# Patient Record
Sex: Female | Born: 1960 | Race: White | Hispanic: No | Marital: Married | Smoking: Current every day smoker
Health system: Southern US, Community
[De-identification: ages and names within clinical notes are randomized; demographics above are authoritative.]

## PROBLEM LIST (undated history)

## (undated) DIAGNOSIS — Z923 Personal history of irradiation: Secondary | ICD-10-CM

## (undated) DIAGNOSIS — G43909 Migraine, unspecified, not intractable, without status migrainosus: Secondary | ICD-10-CM

## (undated) DIAGNOSIS — C801 Malignant (primary) neoplasm, unspecified: Secondary | ICD-10-CM

## (undated) DIAGNOSIS — E781 Pure hyperglyceridemia: Secondary | ICD-10-CM

## (undated) DIAGNOSIS — G4730 Sleep apnea, unspecified: Secondary | ICD-10-CM

## (undated) DIAGNOSIS — F329 Major depressive disorder, single episode, unspecified: Secondary | ICD-10-CM

## (undated) DIAGNOSIS — R002 Palpitations: Secondary | ICD-10-CM

## (undated) DIAGNOSIS — C50919 Malignant neoplasm of unspecified site of unspecified female breast: Secondary | ICD-10-CM

## (undated) DIAGNOSIS — F32A Depression, unspecified: Secondary | ICD-10-CM

## (undated) DIAGNOSIS — E669 Obesity, unspecified: Secondary | ICD-10-CM

## (undated) DIAGNOSIS — R233 Spontaneous ecchymoses: Secondary | ICD-10-CM

## (undated) DIAGNOSIS — K219 Gastro-esophageal reflux disease without esophagitis: Secondary | ICD-10-CM

## (undated) DIAGNOSIS — E039 Hypothyroidism, unspecified: Secondary | ICD-10-CM

## (undated) DIAGNOSIS — E230 Hypopituitarism: Secondary | ICD-10-CM

## (undated) DIAGNOSIS — M1990 Unspecified osteoarthritis, unspecified site: Secondary | ICD-10-CM

## (undated) DIAGNOSIS — H8109 Meniere's disease, unspecified ear: Secondary | ICD-10-CM

## (undated) DIAGNOSIS — R238 Other skin changes: Secondary | ICD-10-CM

## (undated) DIAGNOSIS — K589 Irritable bowel syndrome without diarrhea: Secondary | ICD-10-CM

## (undated) DIAGNOSIS — T7840XA Allergy, unspecified, initial encounter: Secondary | ICD-10-CM

## (undated) DIAGNOSIS — Z5189 Encounter for other specified aftercare: Secondary | ICD-10-CM

## (undated) DIAGNOSIS — E119 Type 2 diabetes mellitus without complications: Secondary | ICD-10-CM

## (undated) HISTORY — DX: Migraine, unspecified, not intractable, without status migrainosus: G43.909

## (undated) HISTORY — DX: Spontaneous ecchymoses: R23.3

## (undated) HISTORY — DX: Palpitations: R00.2

## (undated) HISTORY — DX: Major depressive disorder, single episode, unspecified: F32.9

## (undated) HISTORY — DX: Meniere's disease, unspecified ear: H81.09

## (undated) HISTORY — DX: Allergy, unspecified, initial encounter: T78.40XA

## (undated) HISTORY — DX: Hypothyroidism, unspecified: E03.9

## (undated) HISTORY — DX: Pure hyperglyceridemia: E78.1

## (undated) HISTORY — DX: Encounter for other specified aftercare: Z51.89

## (undated) HISTORY — DX: Gastro-esophageal reflux disease without esophagitis: K21.9

## (undated) HISTORY — DX: Type 2 diabetes mellitus without complications: E11.9

## (undated) HISTORY — PX: SPINE SURGERY: SHX786

## (undated) HISTORY — DX: Malignant (primary) neoplasm, unspecified: C80.1

## (undated) HISTORY — DX: Irritable bowel syndrome, unspecified: K58.9

## (undated) HISTORY — DX: Malignant neoplasm of unspecified site of unspecified female breast: C50.919

## (undated) HISTORY — PX: BREAST SURGERY: SHX581

## (undated) HISTORY — DX: Unspecified osteoarthritis, unspecified site: M19.90

## (undated) HISTORY — DX: Hypopituitarism: E23.0

## (undated) HISTORY — DX: Obesity, unspecified: E66.9

## (undated) HISTORY — DX: Other skin changes: R23.8

## (undated) HISTORY — DX: Depression, unspecified: F32.A

## (undated) HISTORY — DX: Sleep apnea, unspecified: G47.30

## (undated) HISTORY — PX: BREAST RECONSTRUCTION: SHX9

## (undated) HISTORY — PX: TUBAL LIGATION: SHX77

---

## 1983-10-05 HISTORY — PX: CHOLECYSTECTOMY: SHX55

## 1996-10-04 HISTORY — PX: ABDOMINAL HYSTERECTOMY: SHX81

## 2005-10-04 DIAGNOSIS — C50919 Malignant neoplasm of unspecified site of unspecified female breast: Secondary | ICD-10-CM

## 2005-10-04 HISTORY — DX: Malignant neoplasm of unspecified site of unspecified female breast: C50.919

## 2005-10-04 HISTORY — PX: BREAST SURGERY: SHX581

## 2005-12-01 ENCOUNTER — Encounter: Admission: RE | Admit: 2005-12-01 | Discharge: 2005-12-01 | Payer: Self-pay | Admitting: Oncology

## 2005-12-01 ENCOUNTER — Ambulatory Visit (HOSPITAL_COMMUNITY): Payer: Self-pay | Admitting: Oncology

## 2005-12-01 ENCOUNTER — Encounter (HOSPITAL_COMMUNITY): Admission: RE | Admit: 2005-12-01 | Discharge: 2005-12-31 | Payer: Self-pay | Admitting: Oncology

## 2005-12-10 ENCOUNTER — Encounter: Admission: RE | Admit: 2005-12-10 | Discharge: 2005-12-10 | Payer: Self-pay | Admitting: Oncology

## 2005-12-10 ENCOUNTER — Encounter (INDEPENDENT_AMBULATORY_CARE_PROVIDER_SITE_OTHER): Payer: Self-pay | Admitting: Diagnostic Radiology

## 2005-12-10 ENCOUNTER — Encounter (INDEPENDENT_AMBULATORY_CARE_PROVIDER_SITE_OTHER): Payer: Self-pay | Admitting: Specialist

## 2005-12-24 ENCOUNTER — Encounter: Admission: RE | Admit: 2005-12-24 | Discharge: 2005-12-24 | Payer: Self-pay | Admitting: General Surgery

## 2006-01-31 ENCOUNTER — Encounter: Admission: RE | Admit: 2006-01-31 | Discharge: 2006-01-31 | Payer: Self-pay | Admitting: Oncology

## 2006-01-31 ENCOUNTER — Encounter (HOSPITAL_COMMUNITY): Admission: RE | Admit: 2006-01-31 | Discharge: 2006-03-02 | Payer: Self-pay | Admitting: Oncology

## 2006-01-31 ENCOUNTER — Ambulatory Visit (HOSPITAL_COMMUNITY): Payer: Self-pay | Admitting: Oncology

## 2006-02-01 ENCOUNTER — Ambulatory Visit: Payer: Self-pay | Admitting: *Deleted

## 2006-03-04 ENCOUNTER — Encounter: Admission: RE | Admit: 2006-03-04 | Discharge: 2006-03-04 | Payer: Self-pay | Admitting: Oncology

## 2006-03-04 ENCOUNTER — Encounter (HOSPITAL_COMMUNITY): Admission: RE | Admit: 2006-03-04 | Discharge: 2006-04-03 | Payer: Self-pay | Admitting: Oncology

## 2006-03-18 ENCOUNTER — Ambulatory Visit (HOSPITAL_COMMUNITY): Payer: Self-pay | Admitting: Oncology

## 2006-04-02 ENCOUNTER — Encounter (HOSPITAL_COMMUNITY): Admission: RE | Admit: 2006-04-02 | Discharge: 2006-05-02 | Payer: Self-pay | Admitting: Oncology

## 2006-04-02 ENCOUNTER — Encounter: Admission: RE | Admit: 2006-04-02 | Discharge: 2006-04-02 | Payer: Self-pay | Admitting: Oncology

## 2006-05-09 ENCOUNTER — Encounter: Admission: RE | Admit: 2006-05-09 | Discharge: 2006-05-09 | Payer: Self-pay | Admitting: Oncology

## 2006-05-09 ENCOUNTER — Encounter (HOSPITAL_COMMUNITY): Admission: RE | Admit: 2006-05-09 | Discharge: 2006-06-08 | Payer: Self-pay | Admitting: Oncology

## 2006-05-13 ENCOUNTER — Ambulatory Visit: Payer: Self-pay | Admitting: Cardiology

## 2006-05-20 ENCOUNTER — Ambulatory Visit: Admission: RE | Admit: 2006-05-20 | Discharge: 2006-06-26 | Payer: Self-pay | Admitting: Radiation Oncology

## 2006-06-10 ENCOUNTER — Ambulatory Visit (HOSPITAL_COMMUNITY): Payer: Self-pay | Admitting: Oncology

## 2006-06-10 ENCOUNTER — Encounter (HOSPITAL_COMMUNITY): Admission: RE | Admit: 2006-06-10 | Discharge: 2006-07-01 | Payer: Self-pay | Admitting: Oncology

## 2006-06-10 ENCOUNTER — Encounter: Admission: RE | Admit: 2006-06-10 | Discharge: 2006-07-01 | Payer: Self-pay | Admitting: Oncology

## 2006-06-29 ENCOUNTER — Ambulatory Visit: Admission: RE | Admit: 2006-06-29 | Discharge: 2006-09-27 | Payer: Self-pay | Admitting: Radiation Oncology

## 2006-07-08 ENCOUNTER — Encounter: Admission: RE | Admit: 2006-07-08 | Discharge: 2006-07-08 | Payer: Self-pay | Admitting: Oncology

## 2006-07-08 ENCOUNTER — Encounter (HOSPITAL_COMMUNITY): Admission: RE | Admit: 2006-07-08 | Discharge: 2006-08-07 | Payer: Self-pay | Admitting: Oncology

## 2006-08-02 ENCOUNTER — Ambulatory Visit (HOSPITAL_COMMUNITY): Payer: Self-pay | Admitting: Oncology

## 2006-08-19 ENCOUNTER — Encounter: Admission: RE | Admit: 2006-08-19 | Discharge: 2006-08-19 | Payer: Self-pay | Admitting: Oncology

## 2006-08-19 ENCOUNTER — Encounter (HOSPITAL_COMMUNITY): Admission: RE | Admit: 2006-08-19 | Discharge: 2006-09-18 | Payer: Self-pay | Admitting: Oncology

## 2006-09-07 ENCOUNTER — Ambulatory Visit: Payer: Self-pay | Admitting: Cardiology

## 2006-09-30 ENCOUNTER — Ambulatory Visit (HOSPITAL_COMMUNITY): Payer: Self-pay | Admitting: Oncology

## 2006-10-04 HISTORY — PX: BREAST LUMPECTOMY: SHX2

## 2006-10-14 ENCOUNTER — Encounter (HOSPITAL_COMMUNITY): Admission: RE | Admit: 2006-10-14 | Discharge: 2006-11-13 | Payer: Self-pay | Admitting: Oncology

## 2006-11-25 ENCOUNTER — Encounter (HOSPITAL_COMMUNITY): Admission: RE | Admit: 2006-11-25 | Discharge: 2006-12-25 | Payer: Self-pay | Admitting: Oncology

## 2006-11-25 ENCOUNTER — Ambulatory Visit (HOSPITAL_COMMUNITY): Payer: Self-pay | Admitting: Oncology

## 2006-12-15 ENCOUNTER — Ambulatory Visit: Payer: Self-pay | Admitting: Cardiology

## 2006-12-28 ENCOUNTER — Encounter (HOSPITAL_COMMUNITY): Admission: RE | Admit: 2006-12-28 | Discharge: 2007-01-27 | Payer: Self-pay | Admitting: Oncology

## 2007-01-06 ENCOUNTER — Encounter: Admission: RE | Admit: 2007-01-06 | Discharge: 2007-01-06 | Payer: Self-pay | Admitting: Endocrinology

## 2007-01-20 ENCOUNTER — Ambulatory Visit (HOSPITAL_COMMUNITY): Payer: Self-pay | Admitting: Oncology

## 2007-02-03 ENCOUNTER — Encounter (HOSPITAL_COMMUNITY): Admission: RE | Admit: 2007-02-03 | Discharge: 2007-03-05 | Payer: Self-pay | Admitting: Oncology

## 2007-03-17 ENCOUNTER — Ambulatory Visit (HOSPITAL_COMMUNITY): Payer: Self-pay | Admitting: Oncology

## 2007-04-28 ENCOUNTER — Encounter (HOSPITAL_COMMUNITY): Admission: RE | Admit: 2007-04-28 | Discharge: 2007-05-28 | Payer: Self-pay | Admitting: Oncology

## 2007-06-09 ENCOUNTER — Ambulatory Visit (HOSPITAL_COMMUNITY): Payer: Self-pay | Admitting: Oncology

## 2007-08-04 ENCOUNTER — Other Ambulatory Visit: Admission: RE | Admit: 2007-08-04 | Discharge: 2007-08-04 | Payer: Self-pay | Admitting: *Deleted

## 2007-08-23 ENCOUNTER — Encounter (HOSPITAL_COMMUNITY): Admission: RE | Admit: 2007-08-23 | Discharge: 2007-09-22 | Payer: Self-pay | Admitting: Oncology

## 2007-08-23 ENCOUNTER — Ambulatory Visit (HOSPITAL_COMMUNITY): Payer: Self-pay | Admitting: Oncology

## 2008-01-01 ENCOUNTER — Encounter (HOSPITAL_COMMUNITY): Admission: RE | Admit: 2008-01-01 | Discharge: 2008-01-31 | Payer: Self-pay | Admitting: Oncology

## 2008-02-21 ENCOUNTER — Ambulatory Visit (HOSPITAL_COMMUNITY): Payer: Self-pay | Admitting: Oncology

## 2008-02-21 ENCOUNTER — Encounter (HOSPITAL_COMMUNITY): Admission: RE | Admit: 2008-02-21 | Discharge: 2008-03-22 | Payer: Self-pay | Admitting: Oncology

## 2008-06-12 HISTORY — PX: APPENDECTOMY: SHX54

## 2008-09-03 ENCOUNTER — Ambulatory Visit (HOSPITAL_COMMUNITY): Payer: Self-pay | Admitting: Oncology

## 2008-09-03 ENCOUNTER — Encounter (HOSPITAL_COMMUNITY): Admission: RE | Admit: 2008-09-03 | Discharge: 2008-10-02 | Payer: Self-pay | Admitting: Oncology

## 2008-10-04 DIAGNOSIS — G4730 Sleep apnea, unspecified: Secondary | ICD-10-CM

## 2008-10-04 HISTORY — DX: Sleep apnea, unspecified: G47.30

## 2009-01-02 ENCOUNTER — Encounter (HOSPITAL_COMMUNITY): Admission: RE | Admit: 2009-01-02 | Discharge: 2009-02-01 | Payer: Self-pay | Admitting: Oncology

## 2009-03-18 ENCOUNTER — Ambulatory Visit (HOSPITAL_COMMUNITY): Payer: Self-pay | Admitting: Oncology

## 2009-06-11 ENCOUNTER — Ambulatory Visit (HOSPITAL_COMMUNITY): Payer: Self-pay | Admitting: Oncology

## 2009-06-11 ENCOUNTER — Encounter (HOSPITAL_COMMUNITY): Admission: RE | Admit: 2009-06-11 | Discharge: 2009-07-02 | Payer: Self-pay | Admitting: Oncology

## 2009-07-27 LAB — HM COLONOSCOPY

## 2009-12-23 ENCOUNTER — Ambulatory Visit (HOSPITAL_COMMUNITY): Payer: Self-pay | Admitting: Oncology

## 2010-01-01 ENCOUNTER — Ambulatory Visit: Payer: Self-pay | Admitting: Family Medicine

## 2010-01-01 DIAGNOSIS — K589 Irritable bowel syndrome without diarrhea: Secondary | ICD-10-CM | POA: Insufficient documentation

## 2010-01-01 DIAGNOSIS — E669 Obesity, unspecified: Secondary | ICD-10-CM | POA: Insufficient documentation

## 2010-01-01 DIAGNOSIS — K580 Irritable bowel syndrome with diarrhea: Secondary | ICD-10-CM | POA: Insufficient documentation

## 2010-01-01 DIAGNOSIS — G4730 Sleep apnea, unspecified: Secondary | ICD-10-CM | POA: Insufficient documentation

## 2010-01-01 DIAGNOSIS — Z853 Personal history of malignant neoplasm of breast: Secondary | ICD-10-CM | POA: Insufficient documentation

## 2010-01-07 ENCOUNTER — Ambulatory Visit (HOSPITAL_COMMUNITY): Admission: RE | Admit: 2010-01-07 | Discharge: 2010-01-07 | Payer: Self-pay | Admitting: Oncology

## 2010-01-07 ENCOUNTER — Encounter: Payer: Self-pay | Admitting: Physician Assistant

## 2010-01-08 LAB — CONVERTED CEMR LAB
ALT: 17 units/L (ref 0–35)
AST: 17 units/L (ref 0–37)
Albumin: 4.3 g/dL (ref 3.5–5.2)
Alkaline Phosphatase: 96 units/L (ref 39–117)
BUN: 18 mg/dL (ref 6–23)
CO2: 26 meq/L (ref 19–32)
Calcium: 9.3 mg/dL (ref 8.4–10.5)
Chloride: 103 meq/L (ref 96–112)
Cholesterol: 217 mg/dL — ABNORMAL HIGH (ref 0–200)
Creatinine, Ser: 0.98 mg/dL (ref 0.40–1.20)
Free T4: 0.74 ng/dL — ABNORMAL LOW (ref 0.80–1.80)
Glucose, Bld: 93 mg/dL (ref 70–99)
HCT: 45.1 % (ref 36.0–46.0)
HDL: 40 mg/dL (ref >39)
Hemoglobin: 15 g/dL (ref 12.0–15.0)
Hgb A1c MFr Bld: 6.3 % — ABNORMAL HIGH (ref 4.6–6.1)
LDL Cholesterol: 152 mg/dL — ABNORMAL HIGH (ref 0–99)
MCHC: 33.3 g/dL (ref 30.0–36.0)
MCV: 95.3 fL (ref 78.0–100.0)
Platelets: 249 10*3/uL (ref 150–400)
Potassium: 4.2 meq/L (ref 3.5–5.3)
RBC: 4.73 M/uL (ref 3.87–5.11)
RDW: 12.8 % (ref 11.5–15.5)
Sodium: 140 meq/L (ref 135–145)
TSH: 2.784 microintl units/mL (ref 0.350–4.500)
Total Bilirubin: 0.5 mg/dL (ref 0.3–1.2)
Total CHOL/HDL Ratio: 5.4
Total Protein: 6.8 g/dL (ref 6.0–8.3)
Triglycerides: 126 mg/dL (ref <150)
VLDL: 25 mg/dL (ref 0–40)
Vit D, 25-Hydroxy: 20 ng/mL — ABNORMAL LOW (ref 30–89)
WBC: 7.7 10*3/uL (ref 4.0–10.5)

## 2010-01-15 ENCOUNTER — Ambulatory Visit: Payer: Self-pay | Admitting: Family Medicine

## 2010-01-15 DIAGNOSIS — E039 Hypothyroidism, unspecified: Secondary | ICD-10-CM | POA: Insufficient documentation

## 2010-01-15 DIAGNOSIS — E785 Hyperlipidemia, unspecified: Secondary | ICD-10-CM | POA: Insufficient documentation

## 2010-01-15 DIAGNOSIS — E559 Vitamin D deficiency, unspecified: Secondary | ICD-10-CM | POA: Insufficient documentation

## 2010-01-22 ENCOUNTER — Encounter: Payer: Self-pay | Admitting: Physician Assistant

## 2010-02-02 ENCOUNTER — Encounter: Payer: Self-pay | Admitting: Physician Assistant

## 2010-02-17 LAB — CONVERTED CEMR LAB
Free T4: 1.08 ng/dL (ref 0.80–1.80)
TSH: 1.445 microintl units/mL (ref 0.350–4.500)

## 2010-04-20 LAB — CONVERTED CEMR LAB
Cholesterol: 191 mg/dL (ref 0–200)
Glucose, Bld: 107 mg/dL — ABNORMAL HIGH (ref 70–99)
HDL: 44 mg/dL (ref >39)
Hgb A1c MFr Bld: 5.7 % — ABNORMAL HIGH (ref <5.7)
LDL Cholesterol: 121 mg/dL — ABNORMAL HIGH (ref 0–99)
Total CHOL/HDL Ratio: 4.3
Triglycerides: 128 mg/dL (ref <150)
VLDL: 26 mg/dL (ref 0–40)
Vit D, 25-Hydroxy: 47 ng/mL (ref 30–89)

## 2010-04-28 ENCOUNTER — Ambulatory Visit: Payer: Self-pay | Admitting: Family Medicine

## 2010-07-08 ENCOUNTER — Encounter: Payer: Self-pay | Admitting: Physician Assistant

## 2010-07-08 ENCOUNTER — Encounter (HOSPITAL_COMMUNITY)
Admission: RE | Admit: 2010-07-08 | Discharge: 2010-08-07 | Payer: Self-pay | Source: Home / Self Care | Admitting: Oncology

## 2010-07-08 ENCOUNTER — Ambulatory Visit (HOSPITAL_COMMUNITY): Payer: Self-pay | Admitting: Oncology

## 2010-08-17 ENCOUNTER — Ambulatory Visit: Payer: Self-pay | Admitting: Family Medicine

## 2010-08-17 DIAGNOSIS — R946 Abnormal results of thyroid function studies: Secondary | ICD-10-CM | POA: Insufficient documentation

## 2010-08-17 LAB — CONVERTED CEMR LAB
BUN: 14 mg/dL (ref 6–23)
CO2: 25 meq/L (ref 19–32)
Calcium: 9.5 mg/dL (ref 8.4–10.5)
Chloride: 106 meq/L (ref 96–112)
Cholesterol: 205 mg/dL — ABNORMAL HIGH (ref 0–200)
Creatinine, Ser: 0.84 mg/dL (ref 0.40–1.20)
Free T4: 0.93 ng/dL (ref 0.80–1.80)
Glucose, Bld: 106 mg/dL — ABNORMAL HIGH (ref 70–99)
HDL: 46 mg/dL (ref >39)
Hgb A1c MFr Bld: 6.1 % — ABNORMAL HIGH (ref <5.7)
LDL Cholesterol: 139 mg/dL — ABNORMAL HIGH (ref 0–99)
Potassium: 4.5 meq/L (ref 3.5–5.3)
Sodium: 142 meq/L (ref 135–145)
T3, Free: 3.2 pg/mL (ref 2.3–4.2)
TSH: 0.182 microintl units/mL — ABNORMAL LOW (ref 0.350–4.500)
Total CHOL/HDL Ratio: 4.5
Triglycerides: 99 mg/dL (ref <150)
VLDL: 20 mg/dL (ref 0–40)

## 2010-08-18 ENCOUNTER — Encounter: Payer: Self-pay | Admitting: Family Medicine

## 2010-09-17 ENCOUNTER — Ambulatory Visit (HOSPITAL_COMMUNITY)
Admission: RE | Admit: 2010-09-17 | Discharge: 2010-09-17 | Payer: Self-pay | Source: Home / Self Care | Attending: Family Medicine | Admitting: Family Medicine

## 2010-09-18 ENCOUNTER — Encounter: Payer: Self-pay | Admitting: Family Medicine

## 2010-09-18 DIAGNOSIS — E049 Nontoxic goiter, unspecified: Secondary | ICD-10-CM | POA: Insufficient documentation

## 2010-09-21 ENCOUNTER — Encounter: Payer: Self-pay | Admitting: Family Medicine

## 2010-09-23 ENCOUNTER — Encounter: Payer: Self-pay | Admitting: Family Medicine

## 2010-10-02 ENCOUNTER — Ambulatory Visit (HOSPITAL_COMMUNITY)
Admission: RE | Admit: 2010-10-02 | Discharge: 2010-10-02 | Payer: Self-pay | Source: Home / Self Care | Attending: Otolaryngology | Admitting: Otolaryngology

## 2010-10-27 ENCOUNTER — Telehealth: Payer: Self-pay | Admitting: Family Medicine

## 2010-11-03 LAB — BASIC METABOLIC PANEL
BUN: 11 mg/dL (ref 6–23)
CO2: 30 mEq/L (ref 19–32)
Calcium: 10 mg/dL (ref 8.4–10.5)
Chloride: 104 mEq/L (ref 96–112)
Creatinine, Ser: 0.83 mg/dL (ref 0.4–1.2)
GFR calc Af Amer: >60 mL/min (ref >60)
GFR calc non Af Amer: >60 mL/min (ref >60)
Glucose, Bld: 106 mg/dL — ABNORMAL HIGH (ref 70–99)
Potassium: 4.5 mEq/L (ref 3.5–5.1)
Sodium: 145 mEq/L (ref 135–145)

## 2010-11-03 LAB — CBC
HCT: 44.4 % (ref 36.0–46.0)
Hemoglobin: 14.3 g/dL (ref 12.0–15.0)
MCH: 30.7 pg (ref 26.0–34.0)
MCHC: 32.2 g/dL (ref 30.0–36.0)
MCV: 95.3 fL (ref 78.0–100.0)
Platelets: 239 10*3/uL (ref 150–400)
RBC: 4.66 MIL/uL (ref 3.87–5.11)
RDW: 12.1 % (ref 11.5–15.5)
WBC: 7.5 10*3/uL (ref 4.0–10.5)

## 2010-11-03 LAB — SURGICAL PCR SCREEN
MRSA, PCR: NEGATIVE
Staphylococcus aureus: NEGATIVE

## 2010-11-03 NOTE — Letter (Signed)
Summary: ADD LAB ON  ADD LAB ON   Imported By: Lind Guest 08/18/2010 10:02:42  _____________________________________________________________________  External Attachment:    Type:   Image     Comment:   External Document

## 2010-11-03 NOTE — Assessment & Plan Note (Signed)
Summary: follow up - room 1   Vital Signs:  Patient profile:   50 year old female Height:      66 inches Weight:      215 pounds BMI:     34.83 O2 Sat:      96 % on Room air Pulse rate:   92 / minute Resp:     16 per minute BP sitting:   100 / 60  (left arm)  Vitals Entered By: Adella Hare LPN (April 28, 2010 9:25 AM) CC: follow-up visit Is Patient Diabetic? Yes Did you bring your meter with you today? No Pain Assessment Patient in pain? no        CC:  follow-up visit.  History of Present Illness: Pt presents today for f/u. She states she is overall doing well. She is seeing Dr Mariel Sleet every 6 mos.  And see's GI for her IBS. IBS is currently doing well as long as she takes her medicine.  Hx of GERD & well controlled with meds also. Hx of Meniere's.  Uses HCTZ every other day and is doing well .  She also follows a low sodium diet.  She states the days she doesnt take the HCTZ though she has swelling in her ankles.  She will occasionally take one of her husb furosemide for this.  Recently dx with Prediabetes.  Taking Metformin and tolerating well.  Is watching her diet.  Pt is still concerned with her wt. Though she has lost 6 lbs from her last OV.  Recent labs reviewed with pt.   Current Medications (verified): 1)  Tamoxifen Citrate 10 Mg Tabs (Tamoxifen Citrate) .... One Tab By Mouth Two Times A Day 2)  Ativan 1 Mg Tabs (Lorazepam) .... As Needed 3)  Omeprazole 20 Mg Cpdr (Omeprazole) .... One Cap By Mouth Two Times A Day 4)  Triamterene-Hctz 37.5-25 Mg Tabs (Triamterene-Hctz) .... One Tab By Mouth Every Other Day 5)  Naproxen Sodium 550 Mg Tabs (Naproxen Sodium) .... As Needed 6)  Dicyclomine Hcl 20 Mg Tabs (Dicyclomine Hcl) .... One Tab By Mouth Two Times A Day 7)  Diphenoxylate-Atropine 2.5-0.025 Mg Tabs (Diphenoxylate-Atropine) .... One Tab By Mouth Two Times A Day 8)  Metformin Hcl 500 Mg Xr24h-Tab (Metformin Hcl) .... Take 1 Daily With Food  Allergies  (verified): 1)  ! Pcn  Past History:  Past medical history reviewed for relevance to current acute and chronic problems.  Past Medical History: Reviewed history from 01/01/2010 and no changes required. Breast cancer, hx of, 2007 - Dr Mariel Sleet Sleep apnea IBS GERD Meneire's Plantar fascitits  Review of Systems General:  Denies chills and fever. CV:  Denies chest pain or discomfort and palpitations. Resp:  Denies shortness of breath. GI:  Denies abdominal pain, change in bowel habits, indigestion, nausea, and vomiting.  Physical Exam  General:  Well-developed,well-nourished,in no acute distress; alert,appropriate and cooperative throughout examination Head:  Normocephalic and atraumatic without obvious abnormalities. No apparent alopecia or balding. Ears:  External ear exam shows no significant lesions or deformities.  Otoscopic examination reveals clear canals, tympanic membranes are intact bilaterally without bulging, retraction, inflammation or discharge. Hearing is grossly normal bilaterally. Nose:  External nasal examination shows no deformity or inflammation. Nasal mucosa are pink and moist without lesions or exudates. Mouth:  Oral mucosa and oropharynx without lesions or exudates.  Teeth in good repair. Neck:  No deformities, masses, or tenderness noted. Lungs:  Normal respiratory effort, chest expands symmetrically. Lungs are clear to auscultation, no  crackles or wheezes. Heart:  Normal rate and regular rhythm. S1 and S2 normal without gallop, murmur, click, rub or other extra sounds. Cervical Nodes:  No lymphadenopathy noted Psych:  Cognition and judgment appear intact. Alert and cooperative with normal attention span and concentration. No apparent delusions, illusions, hallucinations   Impression & Recommendations:  Problem # 1:  PRE-DIABETES (ICD-790.29) Assessment Improved  Her updated medication list for this problem includes:    Metformin Hcl 500 Mg Xr24h-tab  (Metformin hcl) .Marland Kitchen... Take 1 daily with food  Orders: T-Basic Metabolic Panel [id number]) T- Hemoglobin A1C ([id number])  Problem # 2:  HYPERLIPIDEMIA (ICD-272.4) Assessment: Improved Discussed with pt that her chol numbers have improved, though not at goal yet.  Pt will continue to work on diet and exercise.  She would prefer to avoid medication if possible.  Orders: T-Lipid Profile ([id number])  Problem # 3:  OBESITY (ICD-278.00) Assessment: Improved Wt loss of 6 lbs since last OV.  Problem # 4:  VITAMIN D DEFICIENCY (ICD-268.9) Assessment: Improved  Complete Medication List: 1)  Tamoxifen Citrate 10 Mg Tabs (Tamoxifen citrate) .... One tab by mouth two times a day 2)  Ativan 1 Mg Tabs (Lorazepam) .... As needed 3)  Omeprazole 20 Mg Cpdr (Omeprazole) .... One cap by mouth two times a day 4)  Triamterene-hctz 37.5-25 Mg Tabs (Triamterene-hctz) .... One tab by mouth every day 5)  Naproxen Sodium 550 Mg Tabs (Naproxen sodium) .... As needed 6)  Dicyclomine Hcl 20 Mg Tabs (Dicyclomine hcl) .... One tab by mouth two times a day 7)  Diphenoxylate-atropine 2.5-0.025 Mg Tabs (Diphenoxylate-atropine) .... One tab by mouth two times a day 8)  Metformin Hcl 500 Mg Xr24h-tab (Metformin hcl) .... Take 1 daily with food  Other Orders: T-TSH ([id number]) T-T4, Free [phone])  Patient Instructions: 1)  Please schedule a follow-up appointment in 3 months. 2)  I have ordered lab work for you to have drawn fasting in 3 mos before your next appt. 3)  CONGRATS ON YOUR WEIGHT LOSS! KEEP UP THE GOOD WORK! Prescriptions: TRIAMTERENE-HCTZ 37.5-25 MG TABS (TRIAMTERENE-HCTZ) one tab by mouth every day  #30 x 3   Entered and Authorized by:   Esperanza Sheets PA   Signed by:   Esperanza Sheets PA on 04/28/2010   Method used:   Electronically to        [facility]* (retail)       [address]       [place], [place]  [address]       Ph: [phone]       Fax: [phone]   RxID:    [id number] METFORMIN HCL 500 MG XR24H-TAB (METFORMIN HCL) take 1 daily with food  #30 x 3   Entered and Authorized by:   Esperanza Sheets PA   Signed by:   Esperanza Sheets PA on 04/28/2010   Method used:   Electronically to        [facility]* (retail)       [address]       [place], [place]  [address]       Ph: [phone]       Fax: [phone]   RxID:   [id number]

## 2010-11-03 NOTE — Medication Information (Signed)
Summary: Tax adviser   Imported By: Lind Guest 08/18/2010 07:50:09  _____________________________________________________________________  External Attachment:    Type:   Image     Comment:   External Document

## 2010-11-03 NOTE — Assessment & Plan Note (Signed)
Summary: follow up- room1   Vital Signs:  Patient profile:   50 year old female Height:      66 inches Weight:      221.25 pounds BMI:     35.84 O2 Sat:      98 % on Room air Pulse rate:   87 / minute Resp:     16 per minute BP sitting:   98 / 50  (left arm)  Vitals Entered By: Adella Hare LPN (January 15, 2010 9:18 AM)  Nutrition Counseling: Patient's BMI is greater than 25 and therefore counseled on weight management options. CC: follow-up visit- room 1 Is Patient Diabetic? No Pain Assessment Patient in pain? no        CC:  follow-up visit- room 1.  History of Present Illness: Pt is here today to discuss her lab test results and follow up on her weight concerns.  See her previous visit for more detail regarding her wt loss efforts.  Pt feels that her metabolism & body has not been the same since chemotherapy.  Pt brought a food diary for approx 1 week.  Also documented her exercise. She walks almost daily at least 1 mile.  In reviewing her food intake, pt is getting limited veggies, and no fruits.  Meat intake is lean & mostly not fried.    FH: Mom is diabetic       Dad hx of heart dis  Current Medications (verified): 1)  Tamoxifen Citrate 10 Mg Tabs (Tamoxifen Citrate) .... One Tab By Mouth Two Times A Day 2)  Ativan 1 Mg Tabs (Lorazepam) .... As Needed 3)  Omeprazole 20 Mg Cpdr (Omeprazole) .... One Cap By Mouth Two Times A Day 4)  Triamterene-Hctz 37.5-25 Mg Tabs (Triamterene-Hctz) .... One Tab By Mouth Every Other Day 5)  Naproxen Sodium 550 Mg Tabs (Naproxen Sodium) .... As Needed 6)  Dicyclomine Hcl 20 Mg Tabs (Dicyclomine Hcl) .... One Tab By Mouth Two Times A Day 7)  Diphenoxylate-Atropine 2.5-0.025 Mg Tabs (Diphenoxylate-Atropine) .... One Tab By Mouth Two Times A Day  Allergies (verified): 1)  ! Pcn  Past History:  Past medical history reviewed for relevance to current acute and chronic problems.  Past Medical History: Reviewed history from 01/01/2010  and no changes required. Breast cancer, hx of, 2007 - Dr Mariel Sleet Sleep apnea IBS GERD Meneire's Plantar fascitits  Review of Systems       The patient complains of weight gain.   General:  Complains of fatigue; denies chills, fever, and loss of appetite. GI:  Complains of abdominal pain; DAILY ABD BLOATING, HX OF IBS.. GU:  PRIOR TO HYST MENSES WERE HEAVY Q 2-3 WKS.  NO HX OF AMENORRHEA OR PCOS.. Endo:  Denies excessive hunger and excessive thirst.  Physical Exam  General:  Well-developed,well-nourished,in no acute distress; alert,appropriate and cooperative throughout examination Head:  Normocephalic and atraumatic without obvious abnormalities. No apparent alopecia or balding. Psych:  Cognition and judgment appear intact. Alert and cooperative with normal attention span and concentration. No apparent delusions, illusions, hallucinations Additional Exam:  Majority of pts visit today was discussion & counseling. I spent 35 mins with pt today reviewing her lab work in detail, reviewing her food diary, discussing her weight concerns, and discussing treatment options.   Impression & Recommendations:  Problem # 1:  PRE-DIABETES (ICD-790.29) Assessment New Discussed diet changes, exercise & wt loss. Diet handouts given.  Her updated medication list for this problem includes:    Metformin Hcl 500 Mg  Xr24h-tab (Metformin hcl) .Marland Kitchen... Take 1 daily with food  Orders: T- Hemoglobin A1C ([id number]) T-Glucose, Blood ([id number])  Problem # 2:  HYPERLIPIDEMIA (ICD-272.4) Assessment: New Discussed low fat/chol diet, exercise & wt loss. Chol diet h/o given.  Orders: T-Lipid Profile ([id number])  Problem # 3:  VITAMIN D DEFICIENCY (ICD-268.9) Assessment: New Vit D 2,000 international units once daily. Will recheck levels in 3 mos.  Orders: T-Vitamin D (25-Hydroxy) [id number])  Problem # 4:  OBESITY (ICD-278.00) Assessment: Comment Only  Ht: 66 (01/15/2010)   Wt: 221.25  (01/15/2010)   BMI: 35.84 (01/15/2010)  Complete Medication List: 1)  Tamoxifen Citrate 10 Mg Tabs (Tamoxifen citrate) .... One tab by mouth two times a day 2)  Ativan 1 Mg Tabs (Lorazepam) .... As needed 3)  Omeprazole 20 Mg Cpdr (Omeprazole) .... One cap by mouth two times a day 4)  Triamterene-hctz 37.5-25 Mg Tabs (Triamterene-hctz) .... One tab by mouth every other day 5)  Naproxen Sodium 550 Mg Tabs (Naproxen sodium) .... As needed 6)  Dicyclomine Hcl 20 Mg Tabs (Dicyclomine hcl) .... One tab by mouth two times a day 7)  Diphenoxylate-atropine 2.5-0.025 Mg Tabs (Diphenoxylate-atropine) .... One tab by mouth two times a day 8)  Metformin Hcl 500 Mg Xr24h-tab (Metformin hcl) .... Take 1 daily with food  Other Orders: T-TSH ([id number]) T-T4, Free [phone])  Patient Instructions: 1)  Please schedule a follow-up appointment in 3 months. 2)  It is important that you exercise regularly at least 20 minutes 5 times a week. If you develop chest pain, have severe difficulty breathing, or feel very tired , stop exercising immediately and seek medical attention. 3)  You need to lose weight. Consider a lower calorie diet and regular exercise.  4)  Have your thyroid labs drawn in approx 4 weeks. 5)  Have the other lab work drawn fasting in 3 mos.  Try to do this about 1 week before your next appt. Prescriptions: METFORMIN HCL 500 MG XR24H-TAB (METFORMIN HCL) take 1 daily with food  #30 x 3   Entered and Authorized by:   Esperanza Sheets PA   Signed by:   Esperanza Sheets PA on 01/15/2010   Method used:   Electronically to        [facility]* (retail)       [address]       [place], [place]  [address]       Ph: [phone]       Fax: [phone]   RxID:   [id number]

## 2010-11-03 NOTE — Progress Notes (Signed)
Summary: [facility]  [facility]   Imported By: Lind Guest 02/02/2010 15:09:40  _____________________________________________________________________  External Attachment:    Type:   Image     Comment:   External Document

## 2010-11-03 NOTE — Letter (Signed)
Summary: [facility]  [facility]   Imported By: Lind Guest 07/17/2010 09:52:32  _____________________________________________________________________  External Attachment:    Type:   Image     Comment:   External Document

## 2010-11-03 NOTE — Assessment & Plan Note (Signed)
Summary: office visit   Vital Signs:  Patient profile:   50 year old female Height:      66 inches Weight:      217.25 pounds BMI:     35.19 O2 Sat:      97 % on Room air Pulse rate:   98 / minute Pulse rhythm:   regular Resp:     16 per minute BP sitting:   104 / 62  (left arm)  Vitals Entered By: Adella Hare LPN (August 17, 2010 8:23 AM)  O2 Flow:  Room air CC: follow-up visit Is Patient Diabetic? No Pain Assessment Patient in pain? no        CC:  follow-up visit.  History of Present Illness: pt concerned about inability to loseweight, thinks it is related to her being off thyroid med. She reports doing everything she possibly can to lose weight to no avail. She is willing to continue hr efforts, resistant yo further nutrition counselling, states herplantar fasciitis iis  a barrier to the amt of exercise she desires  Current Medications (verified): 1)  Tamoxifen Citrate 10 Mg Tabs (Tamoxifen Citrate) .... One Tab By Mouth Two Times A Day 2)  Ativan 1 Mg Tabs (Lorazepam) .... As Needed 3)  Omeprazole 20 Mg Cpdr (Omeprazole) .... One Cap By Mouth Two Times A Day 4)  Triamterene-Hctz 37.5-25 Mg Tabs (Triamterene-Hctz) .... One Tab By Mouth Every Day 5)  Naproxen Sodium 550 Mg Tabs (Naproxen Sodium) .... As Needed 6)  Dicyclomine Hcl 20 Mg Tabs (Dicyclomine Hcl) .... One Tab By Mouth Two Times A Day 7)  Diphenoxylate-Atropine 2.5-0.025 Mg Tabs (Diphenoxylate-Atropine) .... One Tab By Mouth Two Times A Day 8)  Metformin Hcl 500 Mg Xr24h-Tab (Metformin Hcl) .... Take 1 Daily With Food 9)  Azo Yeast .... One Tab By Mouth Once Daily  Allergies (verified): 1)  ! Pcn  Past History:  Past Medical History: Breast cancer, hx of, 2007 - Dr Mariel Sleet, partial left mastectomy, followed by radiition and chemtherapy currently on tamoxifen Sleep apnea  dx in 2010, probs with cPAP IBS GERD Meneire's Plantar fascitits  Review of Systems      See HPI General:  Complains of  fatigue. Eyes:  Denies discharge, eye pain, and red eye. ENT:  Denies hoarseness, nasal congestion, postnasal drainage, sinus pressure, and sore throat. CV:  Denies chest pain or discomfort, palpitations, and swelling of feet. Resp:  Denies cough and sputum productive. GI:  Denies abdominal pain, constipation, diarrhea, nausea, and vomiting. GU:  Denies dysuria and urinary frequency. MS:  Complains of joint pain and stiffness; right elbow pain x 4ndays. Derm:  Denies itching, lesion(s), and rash. Neuro:  Denies headaches, seizures, and sensation of room spinning. Psych:  Denies anxiety and depression. Endo:  Denies excessive hunger and excessive thirst. Heme:  Denies abnormal bruising and bleeding. Allergy:  Complains of seasonal allergies; mild.  Physical Exam  General:  Well-developed,obesed,in no acute distress; alert,appropriate and cooperative throughout examination HEENT: No facial asymmetry,  EOMI, No sinus tenderness, TM's Clear, oropharynx  pink and moist.   Chest: Clear to auscultation bilaterally.  CVS: S1, S2, No murmurs, No S3.   Abd: Soft, Nontender.  MS: Adequate ROM spine, hips, shoulders and knees. Tender on palpation of medial aspect right elbow with decreased ROM Ext: No edema.   CNS: CN 2-12 intact, power tone and sensation normal throughout.   Skin: Intact, no visible lesions or rashes.  Psych: Good eye contact, normal affect.  Memory  intact, not anxious or depressed appearing.    Impression & Recommendations:  Problem # 1:  ABNORMAL THYROID FUNCTION TESTS (ICD-794.5) Assessment Comment Only  Orders: Radiology Referral (Radiology)  Problem # 2:  HYPERLIPIDEMIA (ICD-272.4) Assessment: Comment Only  Orders: T-Lipid Profile [id number])  Labs Reviewed: SGOT: 17 (01/07/2010)   SGPT: 17 (01/07/2010)   HDL:44 (04/20/2010), 40 (01/07/2010)  LDL:121 (04/20/2010), 152 (01/07/2010)  Chol:191 (04/20/2010), 217 (01/07/2010)  Trig:128 (04/20/2010), 126  (01/07/2010) Low fat dietdiscussed and encouraged  Problem # 3:  OBESITY (ICD-278.00) Assessment: Deteriorated  Ht: 66 (08/17/2010)   Wt: 217.25 (08/17/2010)   BMI: 35.19 (08/17/2010) therapeutic lifestyle change discussed and encouraged  Problem # 4:  PRE-DIABETES (ICD-790.29) Assessment: Deteriorated  The following medications were removed from the medication list:    Metformin Hcl 500 Mg Xr24h-tab (Metformin hcl) .Marland Kitchen... Take 1 daily with food Her updated medication list for this problem includes:    Metformin Hcl 500 Mg Xr24h-tab (Metformin hcl) .Marland Kitchen... Tablets once daily  Orders: T- Hemoglobin A1C ([id number])  Labs Reviewed: Creat: 0.98 (01/07/2010)     Complete Medication List: 1)  Tamoxifen Citrate 10 Mg Tabs (Tamoxifen citrate) .... One tab by mouth two times a day 2)  Ativan 1 Mg Tabs (Lorazepam) .... As needed 3)  Omeprazole 20 Mg Cpdr (Omeprazole) .... One cap by mouth two times a day 4)  Triamterene-hctz 37.5-25 Mg Tabs (Triamterene-hctz) .... One tab by mouth every day 5)  Naproxen Sodium 550 Mg Tabs (Naproxen sodium) .... As needed 6)  Dicyclomine Hcl 20 Mg Tabs (Dicyclomine hcl) .... One tab by mouth two times a day 7)  Diphenoxylate-atropine 2.5-0.025 Mg Tabs (Diphenoxylate-atropine) .... One tab by mouth two times a day 8)  Azo Yeast  .... One tab by mouth once daily 9)  Metformin Hcl 500 Mg Xr24h-tab (Metformin hcl) .... Tablets once daily  Other Orders: T-TSH ([id number]) T-T3, Free [phone]) T-T4, Free [phone])  Patient Instructions: 1)  Please schedule a follow-up appointment in 3 months. 2)  It is important that you exercise regularly at least 30 minutes 5 times a week. If you develop chest pain, have severe difficulty breathing, or feel very tired , stop exercising immediately and seek medical attention. 3)  You need to lose weight. Consider a lower calorie diet and regular exercise. pls try to change your eating habits 4)  take alleve one 3  times daily for 5 days for right elbow pain, if persits call back for referral to orhto. 5)  you are to start inc dose of metformin 6)  You will be refered for a thyroid US. 7)  pls follow a low fat diet Prescriptions: METFORMIN HCL 500 MG XR24H-TAB (METFORMIN HCL) tablets once daily  #60 x 2   Entered and Authorized by:   Syliva Overman MD   Signed by:   Syliva Overman MD on 08/17/2010   Method used:   Printed then faxed to ...       [facility]* (retail)       [address]       [place], [place]  [address]       Ph: [phone]       Fax: [phone]   RxID:   [id number]    Orders Added: 1)  Est. Patient Level IV [61607] 2)  T-Lipid Profile [[id number]] 3)  T- Hemoglobin A1C [83036-23375] 4)  T-TSH [[id number]] 5)  T-T3, Free [[id number]] 6)  T-T4, Free [[id number]] 7)  Radiology  Referral [Radiology]

## 2010-11-03 NOTE — Progress Notes (Signed)
Summary: dr.gegick  dr.gegick   Imported By: Lind Guest 01/22/2010 14:06:44  _____________________________________________________________________  External Attachment:    Type:   Image     Comment:   External Document

## 2010-11-03 NOTE — Assessment & Plan Note (Signed)
Summary: new patient -room 1   Vital Signs:  Patient profile:   50 year old female Height:      66 inches Weight:      221.50 pounds BMI:     35.88 O2 Sat:      97 % on Room air Pulse rate:   91 / minute Resp:     16 per minute BP sitting:   90 / 50  (left arm) Cuff size:   large  Vitals Entered By: Adella Hare LPN (January 01, 2010 3:00 PM)  Nutrition Counseling: Patient's BMI is greater than 25 and therefore counseled on weight management options. CC: new patient Is Patient Diabetic? No Pain Assessment Patient in pain? no        CC:  new patient.  History of Present Illness: New pt here to establish care with new PCP.  Wants a provider "who will listen to her and not think she is crazy."  Pt is concerned with her wt gain.  States she has gained 37 # since 2004.  Walks 1 hr per day for exercise.  Only eats one meal per day. Pt states she eats fruits, veggies, lean meats.  Occ beef.  Unsweet tea, or dt soda.  She has been to 2 nutritionists, and tried Phenteramine once & was unable to lose any wt.  She states that she even gained wt with one of the nutritionists plans.    Pt states she lost wt when on Synthroid .  Apparently thyroid was not working when had chemo.  But labs recently off of Synthroid have been normal so her endocrinologist would not prescription for her.  Diarrhea x couple mos.  Had colonoscopy in Jul 27, 2009 & was nl.  Dx with IBS.  Uses meds daily.  If misses dose then syptoms recurr. Hx GERD.  Controlled with omeparzole.  Sxs recurr if missies.  Hx of Breast CA. 2007.  See's Dr Mariel Sleet q 6 mos.    Hx of sleep apnea.  Uses CPAP "sometimes."  Dr Jimmye Norman - endocrinologist, did recent labs. Dr Nicholos Johns - prev family dr.  Last pap/pelvic approx 1 mos ago.  OB/GYN -Dr. Kathlene November in [place]   Current Medications (verified): 1)  Tamoxifen Citrate 10 Mg Tabs (Tamoxifen Citrate) .... One Tab By Mouth Two Times A Day 2)  Ativan 1 Mg Tabs (Lorazepam)  .... As Needed 3)  Omeprazole 20 Mg Cpdr (Omeprazole) .... One Cap By Mouth Two Times A Day 4)  Triamterene-Hctz 37.5-25 Mg Tabs (Triamterene-Hctz) .... One Tab By Mouth Every Other Day 5)  Naproxen Sodium 550 Mg Tabs (Naproxen Sodium) .... As Needed 6)  Dicyclomine Hcl 20 Mg Tabs (Dicyclomine Hcl) .... One Tab By Mouth Two Times A Day 7)  Diphenoxylate-Atropine 2.5-0.025 Mg Tabs (Diphenoxylate-Atropine) .... One Tab By Mouth Two Times A Day  Allergies (verified): 1)  ! Pcn  Past History:  Past medical, surgical, family and social histories (including risk factors) reviewed, and no changes noted (except as noted below).  Past Medical History: Breast cancer, hx of, 2007 - Dr Mariel Sleet Sleep apnea IBS GERD Meneire's Plantar fascitits  Past Surgical History: Cholecystectomy Lumpectomy Reconstructive breast surgery Appendectomy Partial hysterectomy Right foot surgery x 2 Left upper arm surgery  Family History: Reviewed history and no changes required. Mother living- DM Father living- Heart dz Three sisters living- presumed healthy One brother deceased- heart dz, DM One brother living- presumed healthy  Social History: Reviewed history and no changes required. Unemployed Married  7 years 2 grown children Current Smoker, less than half a pack a day Alcohol use-no Drug use-no Regular exercise-no Smoking Status:  current Drug Use:  no Does Patient Exercise:  no  Review of Systems General:  Denies chills and fever. CV:  Denies chest pain or discomfort, palpitations, and shortness of breath with exertion. Resp:  Denies cough and shortness of breath. GI:  Complains of abdominal pain, bloody stools, and diarrhea; denies nausea and vomiting; abd pains after eating, bloating, improves with BM. GU:  Denies abnormal vaginal bleeding, dysuria, incontinence, and urinary frequency.  Physical Exam  General:  Well-developed,well-nourished,in no acute distress; alert,appropriate  and cooperative throughout examination Head:  Normocephalic and atraumatic without obvious abnormalities. No apparent alopecia or balding. Ears:  External ear exam shows no significant lesions or deformities.  Otoscopic examination reveals clear canals, tympanic membranes are intact bilaterally without bulging, retraction, inflammation or discharge. Hearing is grossly normal bilaterally. Nose:  External nasal examination shows no deformity or inflammation. Nasal mucosa are pink and moist without lesions or exudates. Mouth:  Oral mucosa and oropharynx without lesions or exudates.  Teeth in good repair. Neck:  No deformities, masses, or tenderness noted.no thyromegaly.   Lungs:  Normal respiratory effort, chest expands symmetrically. Lungs are clear to auscultation, no crackles or wheezes. Heart:  Normal rate and regular rhythm. S1 and S2 normal without gallop, murmur, click, rub or other extra sounds. Abdomen:  Bowel sounds positive,abdomen soft and non-tender without masses, organomegaly or hernias noted. Cervical Nodes:  No lymphadenopathy noted Psych:  Cognition and judgment appear intact. Alert and cooperative with normal attention span and concentration. No apparent delusions, illusions, hallucinations   Impression & Recommendations:  Problem # 1:  OBESITY (ICD-278.00) Assessment Deteriorated Discussed with pt that eating one meal daily & not using her CPAP may be part of the issue with her inability to lose Wt. Will order labs. Will review food diary with pt in 2 wks.  Orders: T-Comprehensive Metabolic Panel [id number]) T- Hemoglobin A1C ([id number])  Problem # 2:  IBS (ICD-564.1) Assessment: Unchanged  Problem # 3:  BREAST CANCER, HX OF (ICD-V10.3) Assessment: Improved  Orders: T-Comprehensive Metabolic Panel ([id number])  Problem # 4:  SLEEP APNEA (ICD-780.57) Assessment: Comment Only Encourage pt to restart CPAP nightly.  Complete Medication List: 1)  Tamoxifen  Citrate 10 Mg Tabs (Tamoxifen citrate) .... One tab by mouth two times a day 2)  Ativan 1 Mg Tabs (Lorazepam) .... As needed 3)  Omeprazole 20 Mg Cpdr (Omeprazole) .... One cap by mouth two times a day 4)  Triamterene-hctz 37.5-25 Mg Tabs (Triamterene-hctz) .... One tab by mouth every other day 5)  Naproxen Sodium 550 Mg Tabs (Naproxen sodium) .... As needed 6)  Dicyclomine Hcl 20 Mg Tabs (Dicyclomine hcl) .... One tab by mouth two times a day 7)  Diphenoxylate-atropine 2.5-0.025 Mg Tabs (Diphenoxylate-atropine) .... One tab by mouth two times a day  Other Orders: T-Lipid Profile ([id number]) T-CBC No Diff ([id number]) T-TSH [id number]) T-T4, Free [id number]) T-Vitamin D (25-Hydroxy) [id number])  Patient Instructions: 1)  Please schedule a follow-up appointment in 2 weeks. 2)  Hab labs drawn fasting. 3)  I want you to keep a food diary for the next 2 weeks & bring it in to your appt.  Write down everything you put in your mouth, including drinks, etc.  Prevention & Chronic Care Immunizations   Influenza vaccine: Not documented    Tetanus booster: Not documented    Pneumococcal vaccine: Not documented  Colorectal Screening  Hemoccult: Not documented    Colonoscopy: normal  (07/27/2009)   Colonoscopy due: 08/2019  Other Screening   Pap smear: Not documented    Mammogram: Not documented   Smoking status: current  (01/01/2010)  Lipids   Total Cholesterol: Not documented   LDL: Not documented   LDL Direct: Not documented   HDL: Not documented   Triglycerides: Not documented   Preventive Care Screening  Colonoscopy:    Date:  07/27/2009    Next Due:  08/2019    Results:  normal

## 2010-11-04 ENCOUNTER — Ambulatory Visit (HOSPITAL_COMMUNITY)
Admission: RE | Admit: 2010-11-04 | Discharge: 2010-11-05 | Disposition: A | Discharge status: Home / Self Care | Payer: BC Managed Care – PPO | Attending: Otolaryngology | Admitting: Otolaryngology

## 2010-11-04 ENCOUNTER — Encounter: Payer: Self-pay | Admitting: Family Medicine

## 2010-11-04 ENCOUNTER — Other Ambulatory Visit (INDEPENDENT_AMBULATORY_CARE_PROVIDER_SITE_OTHER): Payer: Self-pay | Admitting: Otolaryngology

## 2010-11-04 DIAGNOSIS — Z01818 Encounter for other preprocedural examination: Secondary | ICD-10-CM | POA: Insufficient documentation

## 2010-11-04 DIAGNOSIS — Z01812 Encounter for preprocedural laboratory examination: Secondary | ICD-10-CM | POA: Insufficient documentation

## 2010-11-04 DIAGNOSIS — D34 Benign neoplasm of thyroid gland: Secondary | ICD-10-CM | POA: Insufficient documentation

## 2010-11-04 DIAGNOSIS — E119 Type 2 diabetes mellitus without complications: Secondary | ICD-10-CM | POA: Insufficient documentation

## 2010-11-04 DIAGNOSIS — Z01810 Encounter for preprocedural cardiovascular examination: Secondary | ICD-10-CM | POA: Insufficient documentation

## 2010-11-04 DIAGNOSIS — E039 Hypothyroidism, unspecified: Secondary | ICD-10-CM | POA: Insufficient documentation

## 2010-11-04 DIAGNOSIS — G4733 Obstructive sleep apnea (adult) (pediatric): Secondary | ICD-10-CM | POA: Insufficient documentation

## 2010-11-04 DIAGNOSIS — E669 Obesity, unspecified: Secondary | ICD-10-CM | POA: Insufficient documentation

## 2010-11-04 LAB — GLUCOSE, CAPILLARY
Glucose-Capillary: 125 mg/dL — ABNORMAL HIGH (ref 70–99)
Glucose-Capillary: 128 mg/dL — ABNORMAL HIGH (ref 70–99)
Glucose-Capillary: 146 mg/dL — ABNORMAL HIGH (ref 70–99)

## 2010-11-05 HISTORY — PX: THYROIDECTOMY, PARTIAL: SHX18

## 2010-11-05 LAB — GLUCOSE, CAPILLARY
Glucose-Capillary: 112 mg/dL — ABNORMAL HIGH (ref 70–99)
Glucose-Capillary: 151 mg/dL — ABNORMAL HIGH (ref 70–99)

## 2010-11-05 NOTE — Letter (Signed)
Summary: ent  ent   Imported By: Lind Guest 09/30/2010 12:57:18  _____________________________________________________________________  External Attachment:    Type:   Image     Comment:   External Document

## 2010-11-05 NOTE — Miscellaneous (Signed)
  Clinical Lists Changes  Problems: Added new problem of THYROID MASS (ICD-240.9) Orders: Added new Referral order of ENT Referral (ENT) - Signed

## 2010-11-05 NOTE — Progress Notes (Signed)
Summary: speak with nurse   Phone Note Call from Patient   Summary of Call: needs to speak with nurse about her medicine [phone] Initial call taken by: Rudene Anda,  October 27, 2010 11:32 AM    New/Updated Medications: METFORMIN HCL 500 MG XR24H-TAB (METFORMIN HCL) one tab by mouth two times a day Prescriptions: METFORMIN HCL 500 MG XR24H-TAB (METFORMIN HCL) one tab by mouth two times a day  #60 x 0   Entered by:   Adella Hare LPN   Authorized by:   Syliva Overman MD   Signed by:   Adella Hare LPN on 09/81/1914   Method used:   Electronically to        [facility]* (retail)       [address]       [place], [place]  [address]       Ph: [phone]       Fax: [phone]   RxID:   [id number]

## 2010-11-05 NOTE — Letter (Signed)
Summary: Letter  Letter   Imported By: Lind Guest 09/21/2010 14:37:09  _____________________________________________________________________  External Attachment:    Type:   Image     Comment:   External Document

## 2010-11-10 ENCOUNTER — Telehealth: Payer: Self-pay | Admitting: Family Medicine

## 2010-11-12 ENCOUNTER — Encounter: Payer: Self-pay | Admitting: Family Medicine

## 2010-11-12 ENCOUNTER — Ambulatory Visit (INDEPENDENT_AMBULATORY_CARE_PROVIDER_SITE_OTHER): Payer: BC Managed Care – PPO | Admitting: Otolaryngology

## 2010-11-12 NOTE — Op Note (Signed)
NAMETERASA, ORSINI             ACCOUNT NO.:  [id number]  MEDICAL RECORD NO.:  [id number]           PATIENT TYPE:  I  LOCATION:  5126                         FACILITY:  [facility]  PHYSICIAN:  Newman Pies, MD            DATE OF BIRTH:  Oct 04, 1961  DATE OF PROCEDURE:  11/04/2010 DATE OF DISCHARGE:                              OPERATIVE REPORT   SURGEON:  Newman Pies, MD  PREOPERATIVE DIAGNOSIS:  Left thyroid mass.  POSTOPERATIVE DIAGNOSIS:  Left thyroid mass.  PROCEDURE PERFORMED:  Left hemithyroidectomy.  ANESTHESIA:  General endotracheal tube anesthesia.  COMPLICATIONS:  None.  ESTIMATED BLOOD LOSS:  Minimal.  INDICATIONS FOR PROCEDURE:  The patient is a 50 year old female with a history of hypothyroidism.  She was previously evaluated with a thyroid ultrasound, which showed bilateral thyroid nodules.  The largest one measured 3.6 cm on the left side.  In addition, the patient also complains of vocational pressure sensation in her neck, due to thethyroid compression.  She underwent a fine-needle aspiration of the thyroid mass with ultrasound guidance.  The results was inconclusive, suggestive of likely follicular lesion.  Therefore, the possibilities of benign follicular lesion versus malignancy were discussed with the patient.  The treatment options include continuing conservative observations versus left hemithyroidectomy.  The risks, benefits, alternatives, and details of the treatment options were discussed.  The patient would like to proceed with left hemithyroidectomy procedure. The risks, benefits, alternatives, and details of the procedure were discussed with the patient.  Questions were invited and answered. Informed consent was obtained.  DESCRIPTION:  The patient was taken to the operating room and placed in supine on the operating table.  General endotracheal tube anesthesia was administered by the anesthesiologist.  Preop IV antibiotics were given. The patient was  positioned and prepped and draped in a standard fashion for thyroid surgery.  Lidocaine 1% with 1:100,000 epinephrine was injected at the planned site of incision.  A curvilinear anterior cervical incision was made with a 15-blade.  The incision was carried down past the level of the platysma muscles.  Superior and inferiorly based subplatysmal flaps were elevated in a standard fashion.  The strap muscles were identified and divided at midline.  The left strap muscles were retracted laterally, exposing the enlarged left thyroid lobe.  An approximately 3.5-cm left thyroid mass was noted within the body of the left thyroid lobe.  Careful dissection was carried out to free the left thyroid lobe from the surrounding soft tissue.  An inferior parathyroid gland was identified and preserved.  The recurrent laryngeal nerve was identified in the tracheoesophageal groove.  The recurrent laryngeal nerve was preserved and noted to be functional throughout the case.  The left thyroid lobe was then freed from its superior and inferior attachments.  Bleeding was controlled with bipolar cautery and Harmonic scalpels.  The entire thyroid lobe was then transected at the isthmus. It was sent to the pathology department for permanent histologic identification.  The surgical site was copiously irrigated.  A #7 JP drain was placed.  The incision was closed in layers with 4-0 Vicryl sutures and  Dermabond.  That concluded procedure for the patient.  The care of the patient was turned over to the anesthesiologist.  The patient was awakened from anesthesia without difficulty.  She was extubated and transferred to the recovery room in good condition.  OPERATIVE FINDINGS:  An enlarged left thyroid lobe with a 3.5-cm left thyroid mass.  SPECIMENS:  Left thyroid lobe.  FOLLOWUP CARE:  The patient will be observed overnight in the hospital. She will most likely be discharged home on postop day #1.  She will follow  up in my office in approximately 1 week.     Newman Pies, MD     ST/MEDQ  D:  11/04/2010  T:  11/05/2010  Job:  [id number]  cc:   Milus Mallick. Lodema Hong, M.D.  Electronically Signed by Newman Pies MD on 11/12/2010 12:31:29 PM

## 2010-11-13 LAB — CONVERTED CEMR LAB
Cholesterol: 218 mg/dL — ABNORMAL HIGH (ref 0–200)
Free T4: 0.81 ng/dL (ref 0.80–1.80)
HDL: 45 mg/dL (ref >39)
Hgb A1c MFr Bld: 5.9 % — ABNORMAL HIGH (ref <5.7)
LDL Cholesterol: 148 mg/dL — ABNORMAL HIGH (ref 0–99)
T3, Free: 2.4 pg/mL (ref 2.3–4.2)
TSH: 2.206 microintl units/mL (ref 0.350–4.500)
Total CHOL/HDL Ratio: 4.8
Triglycerides: 124 mg/dL (ref <150)
VLDL: 25 mg/dL (ref 0–40)

## 2010-11-16 ENCOUNTER — Ambulatory Visit: Payer: Self-pay | Admitting: Family Medicine

## 2010-11-19 NOTE — Progress Notes (Signed)
Summary: refill  Phone Note Call from Patient   Summary of Call: needs to get refills on lorazpam, triamderine, [facility] [phone]     pts number [phone] Initial call taken by: Rudene Anda,  November 10, 2010 9:29 AM  Follow-up for Phone Call         does not appear that dr Hymie Gorr has ever prescribed ativan  called patient, no answer Follow-up by: Adella Hare LPN,  November 10, 2010 2:15 PM  Additional Follow-up for Phone Call Additional follow up Details #1::        called patient, no answer Additional Follow-up by: Adella Hare LPN,  November 11, 2010 9:00 AM    Prescriptions: TRIAMTERENE-HCTZ 37.5-25 MG TABS (TRIAMTERENE-HCTZ) one tab by mouth every day  #30 x 0   Entered by:   Adella Hare LPN   Authorized by:   Syliva Overman MD   Signed by:   Adella Hare LPN on 95/28/4132   Method used:   Printed then faxed to ...       [facility]* (retail)       [address]       [place], [place]  [address]       Ph: [phone]       Fax: [phone]   RxID:   [id number]

## 2010-11-25 NOTE — Letter (Signed)
Summary: ENT  ENT   Imported By: Lind Guest 11/18/2010 11:00:57  _____________________________________________________________________  External Attachment:    Type:   Image     Comment:   External Document

## 2010-12-07 ENCOUNTER — Ambulatory Visit (INDEPENDENT_AMBULATORY_CARE_PROVIDER_SITE_OTHER): Payer: BC Managed Care – PPO | Admitting: Family Medicine

## 2010-12-07 ENCOUNTER — Encounter: Payer: Self-pay | Admitting: Family Medicine

## 2010-12-07 ENCOUNTER — Other Ambulatory Visit: Payer: Self-pay | Admitting: Family Medicine

## 2010-12-07 DIAGNOSIS — R7309 Other abnormal glucose: Secondary | ICD-10-CM

## 2010-12-07 DIAGNOSIS — G4730 Sleep apnea, unspecified: Secondary | ICD-10-CM

## 2010-12-07 DIAGNOSIS — C50919 Malignant neoplasm of unspecified site of unspecified female breast: Secondary | ICD-10-CM

## 2010-12-07 DIAGNOSIS — R1900 Intra-abdominal and pelvic swelling, mass and lump, unspecified site: Secondary | ICD-10-CM | POA: Insufficient documentation

## 2010-12-08 LAB — H. PYLORI ANTIBODY, IGG: H Pylori IgG: <0.4 {ISR}

## 2010-12-09 ENCOUNTER — Encounter: Payer: Self-pay | Admitting: Family Medicine

## 2010-12-09 LAB — CONVERTED CEMR LAB: Helicobacter Pylori Antibody-IgG: <0.4

## 2010-12-10 ENCOUNTER — Other Ambulatory Visit: Payer: Self-pay | Admitting: Family Medicine

## 2010-12-10 ENCOUNTER — Telehealth: Payer: Self-pay | Admitting: Family Medicine

## 2010-12-10 DIAGNOSIS — R1900 Intra-abdominal and pelvic swelling, mass and lump, unspecified site: Secondary | ICD-10-CM

## 2010-12-13 DIAGNOSIS — E119 Type 2 diabetes mellitus without complications: Secondary | ICD-10-CM | POA: Insufficient documentation

## 2010-12-13 DIAGNOSIS — J440 Chronic obstructive pulmonary disease with acute lower respiratory infection: Secondary | ICD-10-CM

## 2010-12-13 DIAGNOSIS — J209 Acute bronchitis, unspecified: Secondary | ICD-10-CM | POA: Insufficient documentation

## 2010-12-15 ENCOUNTER — Other Ambulatory Visit: Payer: Self-pay | Admitting: Family Medicine

## 2010-12-15 DIAGNOSIS — Z9889 Other specified postprocedural states: Secondary | ICD-10-CM

## 2010-12-15 NOTE — Progress Notes (Signed)
Summary: antibotic  Phone Note Call from Patient   Summary of Call: pt states that medicine for bronchitis not working. [phone]  [facility] Initial call taken by: Rudene Anda,  December 10, 2010 1:04 PM  Follow-up for Phone Call        what are the specific complaints. Needs to give med time to work. I recommend a CXR and sputum for c/s since she is so concerned, also a cbc and diff, if she wants this pls order the tests Follow-up by: Syliva Overman MD,  December 10, 2010 1:39 PM  Additional Follow-up for Phone Call Additional follow up Details #1::        patient states she will call back monday if no better and we can order tests then Additional Follow-up by: Adella Hare LPN,  December 10, 2010 4:13 PM

## 2010-12-15 NOTE — Letter (Signed)
Summary: Letter  Letter   Imported By: Lind Guest 12/09/2010 16:00:22  _____________________________________________________________________  External Attachment:    Type:   Image     Comment:   External Document

## 2010-12-16 ENCOUNTER — Ambulatory Visit (HOSPITAL_COMMUNITY)
Admission: RE | Admit: 2010-12-16 | Discharge: 2010-12-16 | Disposition: A | Discharge status: Home / Self Care | Payer: BC Managed Care – PPO | Source: Ambulatory Visit | Attending: Family Medicine | Admitting: Family Medicine

## 2010-12-16 DIAGNOSIS — R110 Nausea: Secondary | ICD-10-CM | POA: Insufficient documentation

## 2010-12-16 DIAGNOSIS — R1900 Intra-abdominal and pelvic swelling, mass and lump, unspecified site: Secondary | ICD-10-CM

## 2010-12-16 DIAGNOSIS — K5909 Other constipation: Secondary | ICD-10-CM | POA: Insufficient documentation

## 2010-12-16 DIAGNOSIS — R141 Gas pain: Secondary | ICD-10-CM | POA: Insufficient documentation

## 2010-12-16 DIAGNOSIS — R142 Eructation: Secondary | ICD-10-CM | POA: Insufficient documentation

## 2010-12-16 DIAGNOSIS — R109 Unspecified abdominal pain: Secondary | ICD-10-CM | POA: Insufficient documentation

## 2010-12-16 MED ORDER — IOHEXOL 300 MG/ML  SOLN
100.0000 mL | Freq: Once | INTRAMUSCULAR | Status: AC | PRN
  Administered 2010-12-16: 100 mL via INTRAVENOUS

## 2010-12-17 LAB — DIFFERENTIAL
Basophils Absolute: 0 10*3/uL (ref 0.0–0.1)
Basophils Relative: 0 % (ref 0–1)
Eosinophils Absolute: 0.2 10*3/uL (ref 0.0–0.7)
Eosinophils Relative: 2 % (ref 0–5)
Lymphocytes Relative: 35 % (ref 12–46)
Lymphs Abs: 2.6 10*3/uL (ref 0.7–4.0)
Monocytes Absolute: 0.4 10*3/uL (ref 0.1–1.0)
Monocytes Relative: 6 % (ref 3–12)
Neutro Abs: 4.2 10*3/uL (ref 1.7–7.7)
Neutrophils Relative %: 57 % (ref 43–77)

## 2010-12-17 LAB — COMPREHENSIVE METABOLIC PANEL
ALT: 28 U/L (ref 0–35)
AST: 29 U/L (ref 0–37)
Albumin: 3.7 g/dL (ref 3.5–5.2)
Alkaline Phosphatase: 68 U/L (ref 39–117)
BUN: 12 mg/dL (ref 6–23)
CO2: 29 mEq/L (ref 19–32)
Calcium: 9.6 mg/dL (ref 8.4–10.5)
Chloride: 104 mEq/L (ref 96–112)
Creatinine, Ser: 0.82 mg/dL (ref 0.4–1.2)
GFR calc Af Amer: >60 mL/min (ref >60)
GFR calc non Af Amer: >60 mL/min (ref >60)
Glucose, Bld: 113 mg/dL — ABNORMAL HIGH (ref 70–99)
Potassium: 3.9 mEq/L (ref 3.5–5.1)
Sodium: 140 mEq/L (ref 135–145)
Total Bilirubin: 0.6 mg/dL (ref 0.3–1.2)
Total Protein: 6.7 g/dL (ref 6.0–8.3)

## 2010-12-17 LAB — CBC
HCT: 45.4 % (ref 36.0–46.0)
Hemoglobin: 15.5 g/dL — ABNORMAL HIGH (ref 12.0–15.0)
MCH: 32.7 pg (ref 26.0–34.0)
MCHC: 34.1 g/dL (ref 30.0–36.0)
MCV: 95.8 fL (ref 78.0–100.0)
Platelets: 244 10*3/uL (ref 150–400)
RBC: 4.74 MIL/uL (ref 3.87–5.11)
RDW: 12.6 % (ref 11.5–15.5)
WBC: 7.4 10*3/uL (ref 4.0–10.5)

## 2010-12-22 NOTE — Assessment & Plan Note (Signed)
Summary: f up   Vital Signs:  Patient profile:   50 year old female Height:      66 inches Weight:      220 pounds BMI:     35.64 O2 Sat:      95 % Pulse rate:   96 / minute Pulse rhythm:   regular Resp:     16 per minute BP sitting:   108 / 74  (left arm) Cuff size:   regular  Vitals Entered By: Everitt Amber LPN (December 07, 9602 3:31 PM)  Nutrition Counseling: Patient's BMI is greater than 25 and therefore counseled on weight management options. CC: FOllow up chronic problems, yesterday started feeling having headache, cough, achy. Producing greenish phlegm    CC:  FOllow up chronic problems, yesterday started feeling having headache, cough, and achy. Producing greenish phlegm .  History of Present Illness: Reports  that she had been doing well up until approx 3 days ago, when she developed head and chest congestion with scratchy throat, she has ahd positive sick contact recently  reports recent fever or chills.  Denies chest pain, palpitations, PND, orthopnea or leg swelling. Denies  nausea, vomitting, diarrhea or constipation.reports new abdominal pain Denies change in bowel movements or bloody stool. Denies dysuria , frequency, incontinence or hesitancy. Denies  joint pain, swelling, or reduced mobility. Denies headaches, vertigo, seizures. Denies depression, anxiety or insomnia. Denies  rash, lesions, or itch.     Preventive Screening-Counseling & Management  Alcohol-Tobacco     Smoking Status: current     Smoking Cessation Counseling: yes     Packs/Day: 0.5  Current Medications (verified): 1)  Tamoxifen Citrate 10 Mg Tabs (Tamoxifen Citrate) .... One Tab By Mouth Two Times A Day 2)  Ativan 1 Mg Tabs (Lorazepam) .... As Needed 3)  Omeprazole 20 Mg Cpdr (Omeprazole) .... One Cap By Mouth Two Times A Day 4)  Triamterene-Hctz 37.5-25 Mg Tabs (Triamterene-Hctz) .... One Tab By Mouth Every Day 5)  Naproxen Sodium 550 Mg Tabs (Naproxen Sodium) .... As Needed 6)   Dicyclomine Hcl 20 Mg Tabs (Dicyclomine Hcl) .... One Tab By Mouth Two Times A Day 7)  Diphenoxylate-Atropine 2.5-0.025 Mg Tabs (Diphenoxylate-Atropine) .... One Tab By Mouth Two Times A Day 8)  Azo Yeast .... One Tab By Mouth Once Daily 9)  Metformin Hcl 500 Mg Xr24h-Tab (Metformin Hcl) .... One Tab By Mouth Two Times A Day 10)  Vitamin D 1000 Unit Tabs (Cholecalciferol) .... 2 A Day  Allergies (verified): 1)  ! Pcn  Past History:  Past medical, surgical, family and social histories (including risk factors) reviewed for relevance to current acute and chronic problems.  Past Medical History: Reviewed history from 08/17/2010 and no changes required. Breast cancer, hx of, 2007 - Dr Mariel Sleet, partial left mastectomy, followed by radiition and chemtherapy currently on tamoxifen Sleep apnea  dx in 2010, probs with cPAP IBS GERD Meneire's Plantar fascitits  Past Surgical History: Reviewed history from 01/01/2010 and no changes required. Cholecystectomy Lumpectomy Reconstructive breast surgery Appendectomy Partial hysterectomy Right foot surgery x 2 Left upper arm surgery  Family History: Reviewed history from 01/01/2010 and no changes required. Mother living- DM Father living- Heart dz Three sisters living- presumed healthy One brother deceased- heart dz, DM One brother living- presumed healthy  Social History: Reviewed history from 01/01/2010 and no changes required. Unemployed Married 7 years 2 grown children Current Smoker, less than half a pack a day Alcohol use-no Drug use-no Regular exercise-no Packs/Day:  0.5  Review of Systems      See HPI General:  Complains of chills, fatigue, loss of appetite, and malaise; 2 days. ENT:  Complains of nasal congestion, postnasal drainage, sinus pressure, and sore throat; 2 day history, green drainage, body aches and chills last night, had positive sick contact last week. Resp:  Complains of cough and sputum productive;  denies shortness of breath and wheezing; green sputum scant x 2 days. GI:  bloating x 1 year, treated for IBS. GU:  has had ovarian cyst removed, concerned about recurrence as a cause of abdominal discmfort. Endo:  Denies cold intolerance, excessive hunger, excessive thirst, and excessive urination. Heme:  Denies abnormal bruising and bleeding. Allergy:  Denies hives or rash and itching eyes.  Physical Exam  General:  Well-developedobesein no acute distress; alert,appropriate and cooperative throughout examination HEENT: No facial asymmetry,  EOMI, No sinus tenderness, TM's Clear, oropharynx  pink and moist.   Chest: decreased air entry, scattered crackles an wheezes CVS: S1, S2, No murmurs, No S3.   Abd: Soft, mild tenderness in rLQ with questionable mass MS: Adequate ROM spine, hips, shoulders and knees.  Ext: No edema.   CNS: CN 2-12 intact, power tone and sensation normal throughout.   Skin: Intact, no visible lesions or rashes.  Psych: Good eye contact, normal affect.  Memory intact, not anxious or depressed appearing.    Impression & Recommendations:  Problem # 1:  ABDOMINAL MASS (ICD-789.30) Assessment Comment Only  Orders: Radiology Referral (Radiology)  Problem # 2:  NEOPLASM, MALIGNANT, LEFT BREAST (ICD-174.9) Assessment: Comment Only  Orders: Radiology Referral (Radiology)  Problem # 3:  HYPERLIPIDEMIA (ICD-272.4) Assessment: Deteriorated  Labs Reviewed: SGOT: 17 (01/07/2010)   SGPT: 17 (01/07/2010)   HDL:45 (11/12/2010), 46 (08/11/2010)  LDL:148 (11/12/2010), 139 (08/11/2010)  Chol:218 (11/12/2010), 205 (08/11/2010)  Trig:124 (11/12/2010), 99 (08/11/2010) Low fat diet discussed and encouraged,   Problem # 4:  OBESITY (ICD-278.00) Assessment: Unchanged  Ht: 66 (12/07/2010)   Wt: 220 (12/07/2010)   BMI: 35.64 (12/07/2010) therapeutic lifestyle change discussed and encouraged  Problem # 5:  ACUTE BRONCHITIS (ICD-466.0) Assessment: Comment Only  Her  updated medication list for this problem includes:    Septra Ds 800-160 Mg Tabs (Sulfamethoxazole-trimethoprim) .Marland Kitchen... Take 1 tablet by mouth two times a day    Tessalon Perles 100 Mg Caps (Benzonatate) .Marland Kitchen... Take 1 capsule by mouth three times a day  Problem # 6:  DIABETES MELLITUS, TYPE II (ICD-250.00) Assessment: Improved  Her updated medication list for this problem includes:    Metformin Hcl 500 Mg Xr24h-tab (Metformin hcl) ..... One tab by mouth two times a day  Labs Reviewed: Creat: 0.84 (08/11/2010)    Reviewed HgBA1c results: 5.9 (11/12/2010)  6.1 (08/11/2010)  Complete Medication List: 1)  Tamoxifen Citrate 10 Mg Tabs (Tamoxifen citrate) .... One tab by mouth two times a day 2)  Ativan 1 Mg Tabs (Lorazepam) .... As needed 3)  Omeprazole 20 Mg Cpdr (Omeprazole) .... One cap by mouth two times a day 4)  Triamterene-hctz 37.5-25 Mg Tabs (Triamterene-hctz) .... One tab by mouth every day 5)  Naproxen Sodium 550 Mg Tabs (Naproxen sodium) .... As needed 6)  Dicyclomine Hcl 20 Mg Tabs (Dicyclomine hcl) .... One tab by mouth two times a day 7)  Diphenoxylate-atropine 2.5-0.025 Mg Tabs (Diphenoxylate-atropine) .... One tab by mouth two times a day 8)  Azo Yeast  .... One tab by mouth once daily 9)  Metformin Hcl 500 Mg Xr24h-tab (Metformin hcl) .... One  tab by mouth two times a day 10)  Vitamin D 1000 Unit Tabs (Cholecalciferol) .... 2 a day 11)  Septra Ds 800-160 Mg Tabs (Sulfamethoxazole-trimethoprim) .... Take 1 tablet by mouth two times a day 12)  Tessalon Perles 100 Mg Caps (Benzonatate) .... Take 1 capsule by mouth three times a day  Other Orders: TLB-H. Pylori Abs(Helicobacter Pylori) (86677-HELICO)  Patient Instructions: 1)  Please schedule a follow-up appointment in 3 months. 2)  It is important that you exercise regularly at least 30 minutes 5 days  per week.you also need to reduce your caloric intake so that you can lose weight. 3)  Goal is 1 to 2 pounds per week. 4)   Tobacco is very bad for your health and your loved ones! You Should stop smoking!. 5)  Stop Smoking Tips: Choose a Quit date. Cut down before the Quit date. decide what you will do as a substitute when you feel the urge to smoke(gum,toothpick,exercise). 6)  You will be referred for individual classes also. 7)  You are being treated for sinusitis, meds are sent in. 8)  You are referred for MRI of breasts, also scans of your abdomen and pelvis 9)  H pylori today Prescriptions: METFORMIN HCL 500 MG XR24H-TAB (METFORMIN HCL) one tab by mouth two times a day  #60 x 3   Entered by:   Adella Hare LPN   Authorized by:   Syliva Overman MD   Signed by:   Adella Hare LPN on 16/07/9603   Method used:   Electronically to        [facility]* (retail)       [address] [place], [place]  [address]       Ph: [phone]       Fax: [phone]   RxID:   [id number] TESSALON PERLES 100 MG CAPS (BENZONATATE) Take 1 capsule by mouth three times a day  #30 x 0   Entered and Authorized by:   Syliva Overman MD   Signed by:   Syliva Overman MD on 12/07/2010   Method used:   Electronically to        [facility]* (retail)       [address] [place], [place]  [address]       Ph: [phone]       Fax: [phone]   RxID:   [id number] SEPTRA DS 800-160 MG TABS (SULFAMETHOXAZOLE-TRIMETHOPRIM) Take 1 tablet by mouth two times a day  #20 x 0   Entered and Authorized by:   Syliva Overman MD   Signed by:   Syliva Overman MD on 12/07/2010   Method used:   Electronically to        [facility]* (retail)       [address] [place], [place]  [address]       Ph: [phone]       Fax: [phone]   RxID:   [id number]    Orders Added: 1)  Est. Patient Level IV [37106] 2)  TLB-H. Pylori Abs(Helicobacter Pylori) [86677-HELICO] 3)  Radiology Referral [Radiology] 4)  Radiology Referral [Radiology] 5)  Radiology Referral  [Radiology]

## 2011-01-08 ENCOUNTER — Ambulatory Visit (HOSPITAL_COMMUNITY): Payer: Self-pay | Admitting: Oncology

## 2011-01-08 LAB — CBC
HCT: 43.1 % (ref 36.0–46.0)
Hemoglobin: 15 g/dL (ref 12.0–15.0)
MCHC: 34.9 g/dL (ref 30.0–36.0)
MCV: 95.8 fL (ref 78.0–100.0)
Platelets: 214 10*3/uL (ref 150–400)
RBC: 4.5 MIL/uL (ref 3.87–5.11)
RDW: 11.9 % (ref 11.5–15.5)
WBC: 7.7 10*3/uL (ref 4.0–10.5)

## 2011-01-08 LAB — COMPREHENSIVE METABOLIC PANEL
ALT: 24 U/L (ref 0–35)
AST: 23 U/L (ref 0–37)
Albumin: 3.7 g/dL (ref 3.5–5.2)
Alkaline Phosphatase: 88 U/L (ref 39–117)
BUN: 9 mg/dL (ref 6–23)
CO2: 27 mEq/L (ref 19–32)
Calcium: 9.5 mg/dL (ref 8.4–10.5)
Chloride: 108 mEq/L (ref 96–112)
Creatinine, Ser: 0.71 mg/dL (ref 0.4–1.2)
GFR calc Af Amer: >60 mL/min (ref >60)
GFR calc non Af Amer: >60 mL/min (ref >60)
Glucose, Bld: 95 mg/dL (ref 70–99)
Potassium: 3.7 mEq/L (ref 3.5–5.1)
Sodium: 140 mEq/L (ref 135–145)
Total Bilirubin: 0.7 mg/dL (ref 0.3–1.2)
Total Protein: 7 g/dL (ref 6.0–8.3)

## 2011-01-08 LAB — DIFFERENTIAL
Basophils Absolute: 0 10*3/uL (ref 0.0–0.1)
Basophils Relative: 0 % (ref 0–1)
Eosinophils Absolute: 0.2 10*3/uL (ref 0.0–0.7)
Eosinophils Relative: 3 % (ref 0–5)
Lymphocytes Relative: 45 % (ref 12–46)
Lymphs Abs: 3.4 10*3/uL (ref 0.7–4.0)
Monocytes Absolute: 0.4 10*3/uL (ref 0.1–1.0)
Monocytes Relative: 6 % (ref 3–12)
Neutro Abs: 3.6 10*3/uL (ref 1.7–7.7)
Neutrophils Relative %: 47 % (ref 43–77)

## 2011-01-12 ENCOUNTER — Encounter (HOSPITAL_COMMUNITY): Payer: BC Managed Care – PPO | Attending: Oncology | Admitting: Oncology

## 2011-01-12 DIAGNOSIS — C50919 Malignant neoplasm of unspecified site of unspecified female breast: Secondary | ICD-10-CM

## 2011-01-13 ENCOUNTER — Encounter (HOSPITAL_COMMUNITY): Payer: BC Managed Care – PPO | Attending: Oncology

## 2011-01-13 ENCOUNTER — Ambulatory Visit (HOSPITAL_COMMUNITY)
Admission: RE | Admit: 2011-01-13 | Discharge: 2011-01-13 | Disposition: A | Discharge status: Home / Self Care | Payer: BC Managed Care – PPO | Source: Ambulatory Visit | Attending: Family Medicine | Admitting: Family Medicine

## 2011-01-13 DIAGNOSIS — Z79899 Other long term (current) drug therapy: Secondary | ICD-10-CM | POA: Insufficient documentation

## 2011-01-13 DIAGNOSIS — Z9889 Other specified postprocedural states: Secondary | ICD-10-CM

## 2011-01-13 DIAGNOSIS — Z853 Personal history of malignant neoplasm of breast: Secondary | ICD-10-CM | POA: Insufficient documentation

## 2011-01-13 DIAGNOSIS — C50919 Malignant neoplasm of unspecified site of unspecified female breast: Secondary | ICD-10-CM | POA: Insufficient documentation

## 2011-01-13 DIAGNOSIS — J4489 Other specified chronic obstructive pulmonary disease: Secondary | ICD-10-CM | POA: Insufficient documentation

## 2011-01-13 DIAGNOSIS — F17200 Nicotine dependence, unspecified, uncomplicated: Secondary | ICD-10-CM | POA: Insufficient documentation

## 2011-01-13 DIAGNOSIS — J449 Chronic obstructive pulmonary disease, unspecified: Secondary | ICD-10-CM | POA: Insufficient documentation

## 2011-01-14 ENCOUNTER — Ambulatory Visit (INDEPENDENT_AMBULATORY_CARE_PROVIDER_SITE_OTHER): Payer: BC Managed Care – PPO | Admitting: Otolaryngology

## 2011-01-14 DIAGNOSIS — R070 Pain in throat: Secondary | ICD-10-CM

## 2011-01-14 DIAGNOSIS — H8109 Meniere's disease, unspecified ear: Secondary | ICD-10-CM

## 2011-01-14 DIAGNOSIS — H9319 Tinnitus, unspecified ear: Secondary | ICD-10-CM

## 2011-01-14 DIAGNOSIS — K2100 Gastro-esophageal reflux disease with esophagitis, without bleeding: Secondary | ICD-10-CM

## 2011-01-18 ENCOUNTER — Telehealth: Payer: Self-pay | Admitting: Family Medicine

## 2011-01-18 NOTE — Telephone Encounter (Signed)
noted 

## 2011-01-21 ENCOUNTER — Ambulatory Visit
Admission: RE | Admit: 2011-01-21 | Discharge: 2011-01-21 | Disposition: A | Discharge status: Home / Self Care | Payer: BC Managed Care – PPO | Source: Ambulatory Visit | Attending: Family Medicine | Admitting: Family Medicine

## 2011-01-21 DIAGNOSIS — Z9889 Other specified postprocedural states: Secondary | ICD-10-CM

## 2011-01-21 MED ORDER — GADOBENATE DIMEGLUMINE 529 MG/ML IV SOLN
20.0000 mL | Freq: Once | INTRAVENOUS | Status: AC | PRN
  Administered 2011-01-21: 20 mL via INTRAVENOUS

## 2011-01-26 ENCOUNTER — Other Ambulatory Visit (INDEPENDENT_AMBULATORY_CARE_PROVIDER_SITE_OTHER): Payer: Self-pay | Admitting: Internal Medicine

## 2011-01-26 ENCOUNTER — Ambulatory Visit (INDEPENDENT_AMBULATORY_CARE_PROVIDER_SITE_OTHER): Payer: BC Managed Care – PPO | Admitting: Internal Medicine

## 2011-01-26 DIAGNOSIS — R6889 Other general symptoms and signs: Secondary | ICD-10-CM

## 2011-01-26 DIAGNOSIS — T17308A Unspecified foreign body in larynx causing other injury, initial encounter: Secondary | ICD-10-CM

## 2011-01-27 ENCOUNTER — Ambulatory Visit (HOSPITAL_COMMUNITY)
Admission: RE | Admit: 2011-01-27 | Discharge: 2011-01-27 | Disposition: A | Discharge status: Home / Self Care | Payer: BC Managed Care – PPO | Source: Ambulatory Visit | Attending: Internal Medicine | Admitting: Internal Medicine

## 2011-01-27 DIAGNOSIS — R6889 Other general symptoms and signs: Secondary | ICD-10-CM | POA: Insufficient documentation

## 2011-01-27 DIAGNOSIS — R1310 Dysphagia, unspecified: Secondary | ICD-10-CM | POA: Insufficient documentation

## 2011-01-27 DIAGNOSIS — Z9889 Other specified postprocedural states: Secondary | ICD-10-CM | POA: Insufficient documentation

## 2011-01-27 DIAGNOSIS — T17308A Unspecified foreign body in larynx causing other injury, initial encounter: Secondary | ICD-10-CM

## 2011-02-19 NOTE — Procedures (Signed)
[name], Annette [name]             ACCOUNT NO.:  [id number]   MEDICAL RECORD NO.:  [id number]          PATIENT TYPE:  REC   LOCATION:  RAD                           FACILITY:  [facility]   PHYSICIAN:  Gerrit Friends. Dietrich Pates, MD, FACCDATE OF BIRTH:  [birth year]   DATE OF PROCEDURE:  09/07/2006  DATE OF DISCHARGE:                                ECHOCARDIOGRAM   CLINICAL DATA:  A 50 year old woman receiving chemotherapy.   M-MODE:  1. Aorta 2.6.  2. Left atrium 4.1.  3. Septum 1.2.  4. Posterior wall 1.1.  5. LV diastole 3.7.  6. LV systole 3.0.   FINDINGS:  1. Technically adequate echocardiographic study.  2. Normal left atrium, right atrium and right ventricle.  3. Slight thickening of the mitral valve with normal function.  4. Trileaflet and normal aortic valve.  5. Normal internal diameter of the aortic root; mild calcification of      the wall.  6. Normal tricuspid and pulmonic valve; normal proximal pulmonary      artery.  7. Normal internal dimension of the left ventricle; wall thickness of      the upper limit of normal; normal regional and global function with      an estimated ejection fraction 0.60 - 0.65.  8. Normal IVC.  9. Normal Doppler examination.  10.Comparison with May 13, 2006:  Current study is technically      improved; no significant change in findings.      Gerrit Friends. Dietrich Pates, MD, [facility]  Electronically Signed     RMR/MEDQ  D:  09/11/2006  T:  09/11/2006  Job:  [id number]

## 2011-02-19 NOTE — Procedures (Signed)
Annette [name], Annette [name]             ACCOUNT NO.:  [id number]   MEDICAL RECORD NO.:  [id number]          PATIENT TYPE:  REC   LOCATION:  SPCLP                         FACILITY:  [facility]   PHYSICIAN:  Gerrit Friends. Dietrich Pates, MD, FACCDATE OF BIRTH:  1961-08-11   DATE OF PROCEDURE:  12/15/2006  DATE OF DISCHARGE:  09/27/2006                                ECHOCARDIOGRAM   CLINICAL DATA:  50 year old woman receiving chemotherapy for carcinoma  of the breast.   M-mode:  Aorta 2.5, left atrium 4.1, septum 1.2, posterior wall 1.0, LV  diastole 3.7, LV systole 2.5.   1. Technically suboptimal but adequate echocardiographic study.  2. Normal left atrium, right atrium and right ventricle.  3. Normal aortic root diameter; mild calcification of the wall.  4. Normal aortic, mitral and tricuspid valves; mild mitral annular      calcification.  5. Pulmonic valve not well seen, but is grossly normal.  6. Normal IVC.  7. Normal internal dimension, wall thickness, regional and global      function of the left ventricle.  Estimated ejection fraction is      0.60.  8. Comparison with prior study of September 07, 2006:  No significant      interval change.      Gerrit Friends. Dietrich Pates, MD, [facility]  Electronically Signed     RMR/MEDQ  D:  12/16/2006  T:  12/17/2006  Job:  [id number]

## 2011-02-19 NOTE — Procedures (Signed)
NAMEJENALEE, Annette [name] NO.:  [id number]   MEDICAL RECORD NO.:  [id number]          PATIENT TYPE:  REC   LOCATION:                                FACILITY:  [facility]   PHYSICIAN:  Vida Roller, M.D.   DATE OF BIRTH:  [birth year]   DATE OF PROCEDURE:  02/01/2006  DATE OF DISCHARGE:                                  ECHOCARDIOGRAM   REFERRING PHYSICIAN:  Ladona Horns. Mariel Sleet, M.D.   TAPE NUMBER:  [id number], tape count [id number].   INDICATIONS:  This is a woman with breast cancer for chemotherapy.  This is  an evaluation for left ventricular systolic function.   TECHNICAL QUALITY:  Technical quality of the study is adequate.   M-MODE TRACING:  Aorta:  26 mm.  Left atrium:  37 mm.  Septum:  12 mm.  Posterior wall:  11 mm.  Left ventricular diastolic dimension:  32 mm.  Left ventricular systolic dimension:  26 mm.   2-D AND DOPPLER IMAGING:  The left ventricle is normal size.  There is  preserved LV systolic function. Estimated ejection fraction of 55-60%.  There are no wall motion abnormalities seen. There is mild concentric  hypertrophy.   The right ventricle is top normal in size with normal systolic function.   Both atria appear to be normal size.   The aortic valve is unremarkable with no stenosis or regurgitation.   The mitral valve has trivial regurgitation.  No stenosis.   The tricuspid valve has trivial regurgitation.   The pulmonic valve with trivial regurgitation.   There is no pericardial effusion.      Vida Roller, M.D.  Electronically Signed     JH/MEDQ  D:  02/01/2006  T:  02/02/2006  Job:  [id number]

## 2011-02-19 NOTE — Procedures (Signed)
Annette [name], Annette [name]             ACCOUNT NO.:  [id number]   MEDICAL RECORD NO.:  [id number]          PATIENT TYPE:  REC   LOCATION:  SPCLP                         FACILITY:  [facility]   PHYSICIAN:  Gerrit Friends. Dietrich Pates, MD, FACCDATE OF BIRTH:  10/05/1960   DATE OF PROCEDURE:  05/13/2006  DATE OF DISCHARGE:                                  ECHOCARDIOGRAM   PROCEDURE:  Echocardiogram.   REFERRED BY:  Ladona Horns. Neijstrom, MD.   CLINICAL DATA:  A 51 year old woman with lower extremity edema following  chemotherapy.  M-mode aorta 2.9, left atrium 3.7, septum 1.3, posterior wall  1.3, LV diastole 3.9, LV systole 3.0.   1. Technically adequate echocardiographic study.  2. Normal left atrium, right atrium and right ventricle.  3. Normal and trileaflet aortic valve.  4. Normal tricuspid and mitral valves.  5. Pulmonic valve and proximal pulmonary artery not well seen.  6. Normal left ventricular size; very mild hypertrophy; normal regional      and global function; estimated ejection fraction 0.60.  7. Normal IVC.  8. Comparison with prior study of January 31, 2006:  No significant interval      change.      Gerrit Friends. Dietrich Pates, MD, [facility]  Electronically Signed     RMR/MEDQ  D:  05/13/2006  T:  05/14/2006  Job:  [id number]

## 2011-02-24 ENCOUNTER — Encounter (HOSPITAL_BASED_OUTPATIENT_CLINIC_OR_DEPARTMENT_OTHER): Payer: BC Managed Care – PPO | Admitting: Internal Medicine

## 2011-02-24 ENCOUNTER — Ambulatory Visit (HOSPITAL_COMMUNITY)
Admission: RE | Admit: 2011-02-24 | Discharge: 2011-02-24 | Disposition: A | Discharge status: Home / Self Care | Payer: BC Managed Care – PPO | Source: Ambulatory Visit | Attending: Internal Medicine | Admitting: Internal Medicine

## 2011-02-24 DIAGNOSIS — K219 Gastro-esophageal reflux disease without esophagitis: Secondary | ICD-10-CM

## 2011-02-24 DIAGNOSIS — R1310 Dysphagia, unspecified: Secondary | ICD-10-CM

## 2011-02-24 DIAGNOSIS — K449 Diaphragmatic hernia without obstruction or gangrene: Secondary | ICD-10-CM

## 2011-03-04 ENCOUNTER — Encounter (INDEPENDENT_AMBULATORY_CARE_PROVIDER_SITE_OTHER): Payer: BC Managed Care – PPO | Admitting: Internal Medicine

## 2011-03-15 NOTE — Op Note (Signed)
  NAMEJINAN, BIGGINS             ACCOUNT NO.:  [id number]  MEDICAL RECORD NO.:  [id number]           PATIENT TYPE:  O  LOCATION:  DAYP                          FACILITY:  [facility]  PHYSICIAN:  Lionel December, M.D.    DATE OF BIRTH:  [birth year]  DATE OF PROCEDURE: DATE OF DISCHARGE:                              OPERATIVE REPORT   PROCEDURE:  Esophagogastroduodenoscopy.  INDICATION:  Mistee is a 50 year old Caucasian female who has chronic GERD, who has been experiencing fullness and choking sensation in her throat since she had thyroid lobectomy in February of this year.  Her PPI has been change.  She is on Protonix 40 mg twice daily and states the heartburn is well controlled; however, this symptom has not improved.  She had barium esophagogram, which was unremarkable.  I was also told that she has dysphagia but today she denies the symptom.  She is undergoing diagnostic EGD.  Procedures risks were reviewed with the patient.  Informed consent was obtained.  MEDS FOR CONSCIOUS SEDATION:  Cetacaine spray for pharyngeal topical anesthesia, Demerol 50 mg IV, Versed 8 mg IV.  FINDINGS:  Procedure performed in endoscopy suite.  The patient's vital signs and O2 sat were monitored during the procedure and remained stable.  The patient was placed in left lateral recumbent position and Pentax videoscope was passed through oropharynx without any difficulty into esophagus.  On the way in, hypopharyngeal mucosa and laryngeal inlet were well examined and appeared to be normal.  Esophagus:  Mucosa of the esophagus was normal.  The squamocolumnar junction was wavy but no erosions or ulcers were noted.  GE junction was located at 34 cm from the incisors and hiatus was 37.  Stomach:  It was empty and distended very well with insufflation.  Folds of proximal stomach are normal.  Examination of mucosa at body, antrum, pyloric channel as well as angularis, fundus, and cardia was normal.  Duodenum:   Bulbar mucosa was normal.  Scope was passed into second part of the duodenal mucosa and folds were normal.  Endoscope was withdrawn. The patient tolerated the procedure well.  FINAL DIAGNOSIS:  Small sliding hiatal hernia; otherwise, normal esophagogastroduodenoscopy.  I am not convinced that the patient's throat symptoms are secondary to GERD.  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  RECOMMENDATIONS:  She will continue antireflux measures and Protonix at 40 mg p.o. b.i.d.  After review of options with the patient and her husband, we decided to put on Celexa 20 mg p.o. daily, and she is agreeable.  Prescription given for 30 with 5 refills.  She will return for OV with me in 4 to 6 weeks and if symptoms persist, then we will determine whether she should get esophageal manometry or other studies.          ______________________________ Lionel December, M.D.     NR/MEDQ  D:  02/24/2011  T:  02/25/2011  Job:  [id number]  cc:   Milus Mallick. Lodema Hong, M.D. Fax: [phone]  Ladona Horns. Mariel Sleet, MD Fax: [phone]  Electronically Signed by Lionel December M.D. on 03/15/2011 02:39:36 PM

## 2011-03-22 ENCOUNTER — Ambulatory Visit (INDEPENDENT_AMBULATORY_CARE_PROVIDER_SITE_OTHER): Payer: BC Managed Care – PPO | Admitting: Internal Medicine

## 2011-04-12 ENCOUNTER — Other Ambulatory Visit: Payer: Self-pay | Admitting: Family Medicine

## 2011-04-13 ENCOUNTER — Other Ambulatory Visit: Payer: Self-pay | Admitting: Family Medicine

## 2011-04-19 ENCOUNTER — Encounter: Payer: Self-pay | Admitting: Family Medicine

## 2011-04-20 ENCOUNTER — Ambulatory Visit (INDEPENDENT_AMBULATORY_CARE_PROVIDER_SITE_OTHER): Payer: BC Managed Care – PPO | Admitting: Family Medicine

## 2011-04-20 ENCOUNTER — Encounter: Payer: Self-pay | Admitting: Family Medicine

## 2011-04-20 ENCOUNTER — Ambulatory Visit (INDEPENDENT_AMBULATORY_CARE_PROVIDER_SITE_OTHER): Payer: BC Managed Care – PPO | Admitting: Internal Medicine

## 2011-04-20 VITALS — BP 108/70 | HR 88 | Resp 16 | Ht 66.5 in | Wt 216.1 lb

## 2011-04-20 DIAGNOSIS — E785 Hyperlipidemia, unspecified: Secondary | ICD-10-CM

## 2011-04-20 DIAGNOSIS — R5383 Other fatigue: Secondary | ICD-10-CM

## 2011-04-20 DIAGNOSIS — E039 Hypothyroidism, unspecified: Secondary | ICD-10-CM

## 2011-04-20 DIAGNOSIS — R5381 Other malaise: Secondary | ICD-10-CM

## 2011-04-20 DIAGNOSIS — Z853 Personal history of malignant neoplasm of breast: Secondary | ICD-10-CM

## 2011-04-20 DIAGNOSIS — R110 Nausea: Secondary | ICD-10-CM

## 2011-04-20 DIAGNOSIS — E669 Obesity, unspecified: Secondary | ICD-10-CM

## 2011-04-20 DIAGNOSIS — H8109 Meniere's disease, unspecified ear: Secondary | ICD-10-CM

## 2011-04-20 DIAGNOSIS — G4730 Sleep apnea, unspecified: Secondary | ICD-10-CM

## 2011-04-20 DIAGNOSIS — E119 Type 2 diabetes mellitus without complications: Secondary | ICD-10-CM

## 2011-04-20 DIAGNOSIS — R112 Nausea with vomiting, unspecified: Secondary | ICD-10-CM

## 2011-04-20 MED ORDER — TRIAMTERENE-HCTZ 37.5-25 MG PO CAPS: 1.0000 | ORAL_CAPSULE | Freq: Every day | ORAL | Status: DC

## 2011-04-20 NOTE — Patient Instructions (Signed)
F/U in 6 months.  Fasting lipid, chem 7 , TSH and lipid and HBA1C today.  And rept same in 6 months  Pls continue to follow with gynae., also discuss the bruising with dr Jerelyn Scott further in August.  pls gradually inc the duration of use of the cPAP machine, this will greatly improve your energy level, reduce headaches, and overall wellbeing, protecting heart and lungs from failure.  You will be referred for obesity counseling and help.  Weight loss goal of 2 to 4 pounds per month.  It is important that you exercise regularly at least 30 minutes 7 times a week. If you develop chest pain, have severe difficulty breathing, or feel very tired, stop exercising immediately and seek medical attention

## 2011-04-21 LAB — LIPID PANEL
Cholesterol: 210 mg/dL — ABNORMAL HIGH (ref 0–200)
HDL: 39 mg/dL — ABNORMAL LOW (ref >39)
LDL Cholesterol: 142 mg/dL — ABNORMAL HIGH (ref 0–99)
Total CHOL/HDL Ratio: 5.4 Ratio
Triglycerides: 147 mg/dL (ref <150)
VLDL: 29 mg/dL (ref 0–40)

## 2011-04-21 LAB — HEPATIC FUNCTION PANEL
ALT: 24 U/L (ref 0–35)
AST: 19 U/L (ref 0–37)
Albumin: 4.1 g/dL (ref 3.5–5.2)
Alkaline Phosphatase: 54 U/L (ref 39–117)
Bilirubin, Direct: 0.1 mg/dL (ref 0.0–0.3)
Indirect Bilirubin: 0.3 mg/dL (ref 0.0–0.9)
Total Bilirubin: 0.4 mg/dL (ref 0.3–1.2)
Total Protein: 7.2 g/dL (ref 6.0–8.3)

## 2011-04-21 LAB — BASIC METABOLIC PANEL
BUN: 16 mg/dL (ref 6–23)
CO2: 29 mEq/L (ref 19–32)
Calcium: 9 mg/dL (ref 8.4–10.5)
Chloride: 103 mEq/L (ref 96–112)
Creat: 0.87 mg/dL (ref 0.50–1.10)
Glucose, Bld: 87 mg/dL (ref 70–99)
Potassium: 4.1 mEq/L (ref 3.5–5.3)
Sodium: 140 mEq/L (ref 135–145)

## 2011-04-21 LAB — HEMOGLOBIN A1C
Hgb A1c MFr Bld: 6.1 % — ABNORMAL HIGH (ref <5.7)
Mean Plasma Glucose: 128 mg/dL — ABNORMAL HIGH (ref <117)

## 2011-04-21 LAB — TSH: TSH: 2.552 u[IU]/mL (ref 0.350–4.500)

## 2011-04-22 ENCOUNTER — Encounter: Payer: Self-pay | Admitting: Family Medicine

## 2011-04-22 DIAGNOSIS — G4733 Obstructive sleep apnea (adult) (pediatric): Secondary | ICD-10-CM | POA: Insufficient documentation

## 2011-04-22 DIAGNOSIS — H8109 Meniere's disease, unspecified ear: Secondary | ICD-10-CM | POA: Insufficient documentation

## 2011-04-22 DIAGNOSIS — G4730 Sleep apnea, unspecified: Secondary | ICD-10-CM | POA: Insufficient documentation

## 2011-04-22 NOTE — Progress Notes (Signed)
  Subjective:    Patient ID: Annette [name], female    DOB: 04/15/1961, 50 y.o.   MRN: [id number]  HPI The PT is here for follow up and re-evaluation of chronic medical conditions, medication management and review of any  recent lab and radiology data.  Preventive health is updated, specifically  Cancer screening,  and Immunization.    The PT denies any adverse reactions to current medications since the last visit.  States she continues to be frustrated by inability to lose weight, and that she has chronic fatigue. Recently was evaluated by gynae for dyspareunia, and is to start pelvic exercises.     Review of Systems Denies recent fever or chills. Denies sinus pressure, nasal congestion, ear pain or sore throat.Denies vertigo, her disease is controlled with maxzide. Denies chest congestion, productive cough or wheezing. Denies chest pains, palpitations, paroxysmal nocturnal dyspnea, orthopnea and leg swelling Denies abdominal pain, nausea, vomiting,diarrhea or constipation.  Denies rectal bleeding or change in bowel movement. Denies dysuria, frequency, hesitancy or incontinence. Denies joint pain, swelling and limitation in mobility. C/o uncontrolled headaches, reports poor sleep and chronic fatigue Reports mild  Depression,not suicidal or homicidal, neither wants medication or therapy Denies skin break down or rash.        Objective:   Physical Exam    Patient alert and oriented and in no Cardiopulmonary distress.  HEENT: No facial asymmetry, EOMI, no sinus tenderness, TM's clear, Oropharynx pink and moist.  Neck supple no adenopathy.  Chest: Clear to auscultation bilaterally.  CVS: S1, S2 no murmurs, no S3.  ABD: Soft non tender. Bowel sounds normal.  Ext: No edema  MS: Adequate ROM spine, shoulders, hips and knees.  Skin: Intact, no ulcerations or rash noted.  Psych: Good eye contact, normal affect. Memory intact not anxious or depressed appearing.  CNS: CN  2-12 intact, power, tone and sensation normal throughout.     Assessment & Plan:

## 2011-04-22 NOTE — Assessment & Plan Note (Signed)
Asymptomatic on maxzide

## 2011-04-22 NOTE — Assessment & Plan Note (Signed)
Slight improvement, pt is being referred for individual counselling

## 2011-04-22 NOTE — Assessment & Plan Note (Signed)
Low fat diet discussed and encouraged, pt does not want to take med for this at this time, wants non pharmacological mx only

## 2011-04-22 NOTE — Assessment & Plan Note (Signed)
Deteriorated, pt to continue metformin as before

## 2011-04-22 NOTE — Assessment & Plan Note (Signed)
Followed by oncology, still taking tamioxifen

## 2011-04-22 NOTE — Assessment & Plan Note (Signed)
Non compliant with the machine and symptomatic, explained the importance of using regularly for symptom control and improved health

## 2011-04-29 ENCOUNTER — Ambulatory Visit: Payer: BC Managed Care – PPO | Attending: Obstetrics and Gynecology | Admitting: Physical Therapy

## 2011-04-29 DIAGNOSIS — M629 Disorder of muscle, unspecified: Secondary | ICD-10-CM | POA: Insufficient documentation

## 2011-04-29 DIAGNOSIS — IMO0001 Reserved for inherently not codable concepts without codable children: Secondary | ICD-10-CM | POA: Insufficient documentation

## 2011-04-29 DIAGNOSIS — M2420 Disorder of ligament, unspecified site: Secondary | ICD-10-CM | POA: Insufficient documentation

## 2011-04-29 DIAGNOSIS — M2569 Stiffness of other specified joint, not elsewhere classified: Secondary | ICD-10-CM | POA: Insufficient documentation

## 2011-06-30 LAB — COMPREHENSIVE METABOLIC PANEL
ALT: 22
AST: 21
Albumin: 3.6
Alkaline Phosphatase: 154 — ABNORMAL HIGH
BUN: 13
CO2: 26
Calcium: 9.8
Chloride: 109
Creatinine, Ser: 0.85
GFR calc Af Amer: >60
GFR calc non Af Amer: >60
Glucose, Bld: 105 — ABNORMAL HIGH
Potassium: 4.1
Sodium: 140
Total Bilirubin: 0.7
Total Protein: 7

## 2011-06-30 LAB — CBC
HCT: 44.3
Hemoglobin: 15.6 — ABNORMAL HIGH
MCHC: 35.3
MCV: 93.7
Platelets: 244
RBC: 4.73
RDW: 12.4
WBC: 6.6

## 2011-06-30 LAB — DIFFERENTIAL
Basophils Absolute: 0
Basophils Relative: 0
Eosinophils Absolute: 0.2
Eosinophils Relative: 3
Lymphocytes Relative: 33
Lymphs Abs: 2.2
Monocytes Absolute: 0.4
Monocytes Relative: 6
Neutro Abs: 3.8
Neutrophils Relative %: 58

## 2011-07-09 LAB — CBC
HCT: 44.6 % (ref 36.0–46.0)
Hemoglobin: 15.2 g/dL — ABNORMAL HIGH (ref 12.0–15.0)
MCHC: 34.1 g/dL (ref 30.0–36.0)
MCV: 95.4 fL (ref 78.0–100.0)
Platelets: 236 10*3/uL (ref 150–400)
RBC: 4.67 MIL/uL (ref 3.87–5.11)
RDW: 12.3 % (ref 11.5–15.5)
WBC: 6.5 10*3/uL (ref 4.0–10.5)

## 2011-07-09 LAB — DIFFERENTIAL
Basophils Absolute: 0 10*3/uL (ref 0.0–0.1)
Basophils Relative: 0 % (ref 0–1)
Eosinophils Absolute: 0.2 10*3/uL (ref 0.0–0.7)
Eosinophils Relative: 3 % (ref 0–5)
Lymphocytes Relative: 37 % (ref 12–46)
Lymphs Abs: 2.4 10*3/uL (ref 0.7–4.0)
Monocytes Absolute: 0.3 10*3/uL (ref 0.1–1.0)
Monocytes Relative: 5 % (ref 3–12)
Neutro Abs: 3.5 10*3/uL (ref 1.7–7.7)
Neutrophils Relative %: 54 % (ref 43–77)

## 2011-07-13 ENCOUNTER — Encounter (HOSPITAL_COMMUNITY): Payer: Self-pay | Admitting: Oncology

## 2011-07-13 ENCOUNTER — Encounter (HOSPITAL_COMMUNITY): Payer: BC Managed Care – PPO | Attending: Oncology | Admitting: Oncology

## 2011-07-13 VITALS — BP 110/71 | HR 89 | Temp 98.0°F | Ht 66.0 in | Wt 215.4 lb

## 2011-07-13 DIAGNOSIS — Z170 Estrogen receptor positive status [ER+]: Secondary | ICD-10-CM

## 2011-07-13 DIAGNOSIS — C50419 Malignant neoplasm of upper-outer quadrant of unspecified female breast: Secondary | ICD-10-CM

## 2011-07-13 DIAGNOSIS — C50919 Malignant neoplasm of unspecified site of unspecified female breast: Secondary | ICD-10-CM | POA: Insufficient documentation

## 2011-07-13 DIAGNOSIS — Z79899 Other long term (current) drug therapy: Secondary | ICD-10-CM | POA: Insufficient documentation

## 2011-07-13 LAB — CBC
HCT: 45
HCT: 44 % (ref 36.0–46.0)
Hemoglobin: 15.5 — ABNORMAL HIGH
Hemoglobin: 14.9 g/dL (ref 12.0–15.0)
MCH: 33 pg (ref 26.0–34.0)
MCHC: 34.5
MCHC: 33.9 g/dL (ref 30.0–36.0)
MCV: 94.2
MCV: 97.3 fL (ref 78.0–100.0)
Platelets: 280
Platelets: 243 10*3/uL (ref 150–400)
RBC: 4.78
RBC: 4.52 MIL/uL (ref 3.87–5.11)
RDW: 12.7
RDW: 12.4 % (ref 11.5–15.5)
WBC: 7.6
WBC: 13.8 10*3/uL — ABNORMAL HIGH (ref 4.0–10.5)

## 2011-07-13 LAB — COMPREHENSIVE METABOLIC PANEL
ALT: 26
ALT: 19 U/L (ref 0–35)
AST: 24
AST: 14 U/L (ref 0–37)
Albumin: 3.6
Albumin: 3.7 g/dL (ref 3.5–5.2)
Alkaline Phosphatase: 153 — ABNORMAL HIGH
Alkaline Phosphatase: 66 U/L (ref 39–117)
BUN: 14
BUN: 11 mg/dL (ref 6–23)
CO2: 27
CO2: 29 mEq/L (ref 19–32)
Calcium: 9.4
Calcium: 9.6 mg/dL (ref 8.4–10.5)
Chloride: 106
Chloride: 102 mEq/L (ref 96–112)
Creatinine, Ser: 0.77
Creatinine, Ser: 0.63 mg/dL (ref 0.50–1.10)
GFR calc Af Amer: >60
GFR calc Af Amer: >90 mL/min (ref >90)
GFR calc non Af Amer: >60
GFR calc non Af Amer: >90 mL/min (ref >90)
Glucose, Bld: 105 — ABNORMAL HIGH
Glucose, Bld: 94 mg/dL (ref 70–99)
Potassium: 3.6
Potassium: 3.6 mEq/L (ref 3.5–5.1)
Sodium: 139
Sodium: 141 mEq/L (ref 135–145)
Total Bilirubin: 0.8
Total Bilirubin: 0.2 mg/dL — ABNORMAL LOW (ref 0.3–1.2)
Total Protein: 6.7
Total Protein: 7.1 g/dL (ref 6.0–8.3)

## 2011-07-13 LAB — DIFFERENTIAL
Basophils Absolute: 0
Basophils Absolute: 0.1 10*3/uL (ref 0.0–0.1)
Basophils Relative: 0
Basophils Relative: 0 % (ref 0–1)
Eosinophils Absolute: 0.3
Eosinophils Absolute: 0.3 10*3/uL (ref 0.0–0.7)
Eosinophils Relative: 4
Eosinophils Relative: 2 % (ref 0–5)
Lymphocytes Relative: 31
Lymphocytes Relative: 27 % (ref 12–46)
Lymphs Abs: 2.3
Lymphs Abs: 3.7 10*3/uL (ref 0.7–4.0)
Monocytes Absolute: 0.5
Monocytes Absolute: 0.9 10*3/uL (ref 0.1–1.0)
Monocytes Relative: 7
Monocytes Relative: 7 % (ref 3–12)
Neutro Abs: 4.5
Neutro Abs: 8.9 10*3/uL — ABNORMAL HIGH (ref 1.7–7.7)
Neutrophils Relative %: 59
Neutrophils Relative %: 64 % (ref 43–77)

## 2011-07-13 LAB — MAGNESIUM: Magnesium: 2.3

## 2011-07-13 NOTE — Patient Instructions (Signed)
[facility]  Discharge Instructions  RECOMMENDATIONS MADE BY THE CONSULTANT AND ANY TEST RESULTS WILL BE SENT TO YOUR REFERRING DOCTOR.   EXAM FINDINGS BY MD TODAY AND SIGNS AND SYMPTOMS TO REPORT TO CLINIC OR PRIMARY MD: Doing well, report any new lumps, bone pain or shortness of breath.  Continue tamoxifen for 1 more month then stop.  MEDICATIONS PRESCRIBED: none    SPECIAL INSTRUCTIONS/FOLLOW-UP: Return to Clinic in  6 months.   I acknowledge that I have been informed and understand all the instructions given to me and received a copy. I do not have any more questions at this time, but understand that I may call the Specialty [facility] at [facility] at [phone] during business hours should I have any further questions or need assistance in obtaining follow-up care.    __________________________________________  _____________  __________ Signature of Patient or Authorized Representative            Date                   Time    __________________________________________ Nurse's Signature

## 2011-07-13 NOTE — Progress Notes (Signed)
This office note has been dictated.

## 2011-07-13 NOTE — Progress Notes (Signed)
Labs drawn today for cbc,diff,cmp 

## 2011-07-13 NOTE — Progress Notes (Signed)
PHYSICIANS:  Milus Mallick. Lodema Hong, M.D., Maryln Gottron, M.D. at the [facility], Pleas Patricia, M.D.  DIAGNOSES: 1. 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  She then also started tamoxifen after     radiation therapy in November 2007.  She has 1 more month and she     will complete 5 full years of therapy. 2. Chronic obstructive pulmonary disease secondary to longstanding     smoking history, still smoking about a half a pack to a pack a     cigarettes a day. 3. Abdominal discomfort, bloating, negative workup by Dr. Lionel December which has been quite extensive. 4. Pituitary insufficiency for which she has seen an endocrinologist     at [facility] over the years. 5. Depression on Lamictal in the past.  Not presently on that. 6. Obesity still weighing 215 pounds on a 5-foot 6-inch frame with a     BMI of 34.8. 7. Appendectomy September 2009 by Dr. [name]. 8. Migraine headaches.  She takes naproxen for this. She is here with only a couple of complaints.  One is her abdominal symptomatology which Dr. Karilyn Cota has worked up completely and has a negative workup so far.  She probably has some IBS it sounds like, but not much else to go on.  She had EGD most recently by Dr. Karilyn Cota as well as on esophagogram May of this year, and the esophagogram was soon thereafter.  Nothing was really found.  Her other problem she states is easy bruising on her forearms but again she is a smoker, she has over 50 now and she takes an anti-inflammatory, sometimes once a week, sometimes once every  2-4 weeks, and I think it is a combination of thin skin, smoker skin, and anti-inflammatories that gives her very easy bruisability but again it is only on her forearms, there are no ecchymoses to see any where else today.  Other than that, she is not aware of anything new or different.  PHYSICAL EXAMINATION:  Vital signs:  Blood pressure 110/71.  Her weight has been mentioned.  She has a pulse of 88 and regular, respirations 14 to 16 and unlabored.  Temperature is normal.  She denies any pain presently.  Lungs:  Her lungs are clear but I think her breath sounds are becoming slightly diminished compared to before.  Her heart shows a regular rhythm and rate without obvious murmur, rub or gallop.  The left breast has distinct surgical changes, some post radiation thinning of the skin, some deformity of the breast.  Right breast is negative for any masses.  No masses in the left breast either.  She has no adenopathy in any location.  Abdomen:  Obese, nontender on the right, slightly tender left upper quadrant but I cannot feel any splenomegaly or hepatomegaly.  Bowel sounds are diminished.  Extremities:  She has no peripheral edema of the arms or legs.  So it is time for a little blood work.  We will see  her back in 6 months.  She will finish her tamoxifen as of November 2012.  After her next visit, we will probably just go to once-a-year followup.    ______________________________ Ladona Horns. Mariel Sleet, MD ESN/MEDQ  D:  07/13/2011  T:  07/13/2011  Job:  [id number]

## 2011-07-19 LAB — DIFFERENTIAL
Basophils Absolute: 0
Basophils Relative: 0
Eosinophils Absolute: 0.3
Eosinophils Relative: 5
Lymphocytes Relative: 34
Lymphs Abs: 2.1
Monocytes Absolute: 0.7
Monocytes Relative: 11
Neutro Abs: 3.1
Neutrophils Relative %: 50

## 2011-07-19 LAB — CBC
HCT: 39.9
Hemoglobin: 13.8
MCHC: 34.6
MCV: 93.9
Platelets: 254
RBC: 4.25
RDW: 11.9
WBC: 6.2

## 2011-07-23 ENCOUNTER — Encounter (HOSPITAL_COMMUNITY): Payer: Self-pay | Admitting: *Deleted

## 2011-07-23 ENCOUNTER — Emergency Department (HOSPITAL_COMMUNITY)
Admission: EM | Admit: 2011-07-23 | Discharge: 2011-07-24 | Disposition: A | Discharge status: Home / Self Care | Payer: BC Managed Care – PPO | Attending: Emergency Medicine | Admitting: Emergency Medicine

## 2011-07-23 DIAGNOSIS — F17200 Nicotine dependence, unspecified, uncomplicated: Secondary | ICD-10-CM | POA: Insufficient documentation

## 2011-07-23 DIAGNOSIS — K589 Irritable bowel syndrome without diarrhea: Secondary | ICD-10-CM | POA: Insufficient documentation

## 2011-07-23 DIAGNOSIS — K219 Gastro-esophageal reflux disease without esophagitis: Secondary | ICD-10-CM | POA: Insufficient documentation

## 2011-07-23 DIAGNOSIS — J4489 Other specified chronic obstructive pulmonary disease: Secondary | ICD-10-CM | POA: Insufficient documentation

## 2011-07-23 DIAGNOSIS — Z9089 Acquired absence of other organs: Secondary | ICD-10-CM | POA: Insufficient documentation

## 2011-07-23 DIAGNOSIS — H8109 Meniere's disease, unspecified ear: Secondary | ICD-10-CM | POA: Insufficient documentation

## 2011-07-23 DIAGNOSIS — Z853 Personal history of malignant neoplasm of breast: Secondary | ICD-10-CM | POA: Insufficient documentation

## 2011-07-23 DIAGNOSIS — J449 Chronic obstructive pulmonary disease, unspecified: Secondary | ICD-10-CM | POA: Insufficient documentation

## 2011-07-23 DIAGNOSIS — R112 Nausea with vomiting, unspecified: Secondary | ICD-10-CM | POA: Insufficient documentation

## 2011-07-23 DIAGNOSIS — J3489 Other specified disorders of nose and nasal sinuses: Secondary | ICD-10-CM | POA: Insufficient documentation

## 2011-07-23 DIAGNOSIS — R059 Cough, unspecified: Secondary | ICD-10-CM | POA: Insufficient documentation

## 2011-07-23 DIAGNOSIS — R1013 Epigastric pain: Secondary | ICD-10-CM | POA: Insufficient documentation

## 2011-07-23 DIAGNOSIS — R05 Cough: Secondary | ICD-10-CM | POA: Insufficient documentation

## 2011-07-23 DIAGNOSIS — R197 Diarrhea, unspecified: Secondary | ICD-10-CM | POA: Insufficient documentation

## 2011-07-23 DIAGNOSIS — G4730 Sleep apnea, unspecified: Secondary | ICD-10-CM | POA: Insufficient documentation

## 2011-07-23 MED ORDER — SODIUM CHLORIDE 0.9 % IV BOLUS (SEPSIS)
1000.0000 mL | Freq: Once | INTRAVENOUS | Status: AC
  Administered 2011-07-23 (×2): 1000 mL via INTRAVENOUS

## 2011-07-23 MED ORDER — GUAIFENESIN-CODEINE 100-10 MG/5ML PO SOLN: 10.0000 mL | Freq: Once | ORAL | Status: DC

## 2011-07-23 MED ORDER — SODIUM CHLORIDE 0.9 % IV BOLUS (SEPSIS)
1000.0000 mL | Freq: Once | INTRAVENOUS | Status: AC
  Administered 2011-07-23: 1000 mL via INTRAVENOUS

## 2011-07-23 MED ORDER — ONDANSETRON HCL 4 MG/2ML IJ SOLN
4.0000 mg | Freq: Once | INTRAMUSCULAR | Status: AC
  Administered 2011-07-23: 4 mg via INTRAVENOUS
  Filled 2011-07-23: qty 2

## 2011-07-23 NOTE — ED Notes (Addendum)
Pt c/o cough, n/v/ x 1wk and diarrhea x 1 day. Pt also reports several syncopal episodes yesterday. Denies falling, states she caught herself on the chair.

## 2011-07-24 ENCOUNTER — Emergency Department (HOSPITAL_COMMUNITY): Payer: BC Managed Care – PPO

## 2011-07-24 LAB — URINALYSIS, ROUTINE W REFLEX MICROSCOPIC
Bilirubin Urine: NEGATIVE
Glucose, UA: NEGATIVE mg/dL
Leukocytes, UA: NEGATIVE
Nitrite: NEGATIVE
Protein, ur: NEGATIVE mg/dL
Specific Gravity, Urine: 1.01 (ref 1.005–1.030)
Urobilinogen, UA: 0.2 mg/dL (ref 0.0–1.0)
pH: 6 (ref 5.0–8.0)

## 2011-07-24 LAB — BASIC METABOLIC PANEL
BUN: 15 mg/dL (ref 6–23)
CO2: 24 mEq/L (ref 19–32)
Calcium: 9.1 mg/dL (ref 8.4–10.5)
Chloride: 101 mEq/L (ref 96–112)
Creatinine, Ser: 0.96 mg/dL (ref 0.50–1.10)
GFR calc Af Amer: 78 mL/min — ABNORMAL LOW (ref >90)
GFR calc non Af Amer: 67 mL/min — ABNORMAL LOW (ref >90)
Glucose, Bld: 94 mg/dL (ref 70–99)
Potassium: 2.7 mEq/L — CL (ref 3.5–5.1)
Sodium: 138 mEq/L (ref 135–145)

## 2011-07-24 LAB — CBC
HCT: 38.7 % (ref 36.0–46.0)
Hemoglobin: 13.4 g/dL (ref 12.0–15.0)
MCH: 32.4 pg (ref 26.0–34.0)
MCHC: 34.6 g/dL (ref 30.0–36.0)
MCV: 93.5 fL (ref 78.0–100.0)
Platelets: 280 10*3/uL (ref 150–400)
RBC: 4.14 MIL/uL (ref 3.87–5.11)
RDW: 11.8 % (ref 11.5–15.5)
WBC: 7.8 10*3/uL (ref 4.0–10.5)

## 2011-07-24 LAB — DIFFERENTIAL
Basophils Absolute: 0 10*3/uL (ref 0.0–0.1)
Basophils Relative: 0 % (ref 0–1)
Eosinophils Absolute: 0.1 10*3/uL (ref 0.0–0.7)
Eosinophils Relative: 2 % (ref 0–5)
Lymphocytes Relative: 40 % (ref 12–46)
Lymphs Abs: 3.1 10*3/uL (ref 0.7–4.0)
Monocytes Absolute: 0.7 10*3/uL (ref 0.1–1.0)
Monocytes Relative: 8 % (ref 3–12)
Neutro Abs: 3.9 10*3/uL (ref 1.7–7.7)
Neutrophils Relative %: 50 % (ref 43–77)

## 2011-07-24 LAB — HEPATIC FUNCTION PANEL
ALT: 26 U/L (ref 0–35)
AST: 21 U/L (ref 0–37)
Albumin: 3.2 g/dL — ABNORMAL LOW (ref 3.5–5.2)
Alkaline Phosphatase: 63 U/L (ref 39–117)
Bilirubin, Direct: <0.1 mg/dL (ref 0.0–0.3)
Total Bilirubin: 0.3 mg/dL (ref 0.3–1.2)
Total Protein: 7 g/dL (ref 6.0–8.3)

## 2011-07-24 LAB — URINE MICROSCOPIC-ADD ON

## 2011-07-24 MED ORDER — POTASSIUM CHLORIDE CRYS ER 20 MEQ PO TBCR: 20.0000 meq | EXTENDED_RELEASE_TABLET | Freq: Every day | ORAL | Status: DC

## 2011-07-24 MED ORDER — POTASSIUM CHLORIDE CRYS ER 20 MEQ PO TBCR
EXTENDED_RELEASE_TABLET | ORAL | Status: AC
  Filled 2011-07-24: qty 3

## 2011-07-24 MED ORDER — PROMETHAZINE HCL 25 MG RE SUPP: 25.0000 mg | Freq: Four times a day (QID) | RECTAL | Status: DC | PRN

## 2011-07-24 MED ORDER — PROMETHAZINE HCL 25 MG PO TABS: 12.5000 mg | ORAL_TABLET | Freq: Four times a day (QID) | ORAL | Status: DC | PRN

## 2011-07-24 MED ORDER — POTASSIUM CHLORIDE CRYS ER 20 MEQ PO TBCR
60.0000 meq | EXTENDED_RELEASE_TABLET | Freq: Once | ORAL | Status: AC
  Administered 2011-07-24: 60 meq via ORAL

## 2011-07-24 NOTE — ED Provider Notes (Addendum)
History     CSN: [id number] Arrival date & time: 07/23/2011 10:29 PM   First MD Initiated Contact with Patient 07/23/11 2314      Chief Complaint  Patient presents with  . Cough  . Nausea  . Emesis  . Diarrhea    (Consider location/radiation/quality/duration/timing/severity/associated sxs/prior treatment) HPI Comments: Seen 2311  Patient is a 50 y.o. female presenting with cough, vomiting, and diarrhea. The history is provided by the patient.  Cough This is a new (Patient seen by [facility] for cough, nasal congestion, sinus pressure. Started on Levaquin.Developed  nausea, vomiting.) problem. The current episode started more than 2 days ago. The problem has been gradually worsening. The cough is non-productive. There has been no fever. Pertinent negatives include no chest pain, no chills, no sweats, no ear congestion, no ear pain, no headaches, no rhinorrhea, no sore throat, no myalgias, no shortness of breath and no eye redness. She is not a smoker.  Emesis  Associated symptoms include cough and diarrhea. Pertinent negatives include no chills, no headaches, no myalgias and no sweats.  Diarrhea The primary symptoms include vomiting and diarrhea. Primary symptoms do not include myalgias.  The illness does not include chills.    Past Medical History  Diagnosis Date  . Hx of breast cancer 2007  . S/P partial mastectomy     left   . History of radiation therapy   . History of cancer chemotherapy     currently on tamoxifen  . Sleep apnea 2010    probs with C Pap  . IBS (irritable bowel syndrome)   . GERD (gastroesophageal reflux disease)   . Cancer   . Headache   . Menieres disease     controlled with triamterene   . Breast cancer   . Pituitary insufficiency   . IBS (irritable bowel syndrome)   . Migraine   . Depression   . Obesity   . COPD (chronic obstructive pulmonary disease)     Past Surgical History  Procedure Date  . Breast surgery 2007    left lumpectomy    . Thyroidectomy, partial 11/05/2010    benign disease  . Abdominal hysterectomy 1998    benign, fibroids  . Appendectomy 06/12/08  . Cholecystectomy 1985    Family History  Problem Relation Age of Onset  . Diabetes Mother   . Heart disease Father     History  Substance Use Topics  . Smoking status: Current Everyday Smoker  . Smokeless tobacco: Never Used  . Alcohol Use: No    OB History    Grav Para Term Preterm Abortions TAB SAB Ect Mult Living                  Review of Systems  Constitutional: Negative for chills.  HENT: Negative for ear pain, sore throat and rhinorrhea.   Eyes: Negative for redness.  Respiratory: Positive for cough. Negative for shortness of breath.   Cardiovascular: Negative for chest pain.  Gastrointestinal: Positive for vomiting and diarrhea.  Musculoskeletal: Negative for myalgias.  Neurological: Negative for headaches.  All other systems reviewed and are negative.    Allergies  Penicillins  Home Medications   Current Outpatient Rx  Name Route Sig Dispense Refill  . DICYCLOMINE HCL 20 MG PO TABS Oral Take 20 mg by mouth 2 (two) times daily.      Marland Kitchen DIPHENOXYLATE-ATROPINE 2.5-0.025 MG PO TABS Oral Take 1 tablet by mouth 2 (two) times daily. One tab by mouth twice daily       .  LORAZEPAM 1 MG PO TABS Oral Take 1 mg by mouth as needed.     Marland Kitchen METFORMIN HCL ER (OSM) 500 MG PO TB24 Oral Take 500 mg by mouth daily.     Marland Kitchen NAPROXEN SODIUM 550 MG PO TABS Oral Take 550 mg by mouth as needed. As needed       . PANTOPRAZOLE SODIUM 40 MG PO TBEC Oral Take 40 mg by mouth 2 (two) times daily.     Marland Kitchen TAMOXIFEN CITRATE 10 MG PO TABS Oral Take 10 mg by mouth 2 (two) times daily.      . TRAZODONE HCL 50 MG PO TABS Oral Take 50 mg by mouth at bedtime.      . TRIAMTERENE-HCTZ 37.5-25 MG PO CAPS Oral Take 1 capsule by mouth daily. One tab by mouth everyday  30 capsule 6  . DICYCLOMINE HCL PO Oral Take 20 mg by mouth daily.      Marland Kitchen TAMOXIFEN CITRATE PO Oral  Take 10 mg by mouth 2 (two) times daily. One tablet by mouth two times a day       BP 115/70  Pulse 92  Temp(Src) 98.4 F (36.9 C) (Oral)  Resp 20  Ht 5\' 6"  (1.676 m)  Wt 201 lb (91.173 kg)  BMI 32.44 kg/m2  SpO2 99%  Physical Exam  Nursing note and vitals reviewed. Constitutional: She is oriented to person, place, and time. She appears well-developed and well-nourished.  HENT:  Head: Normocephalic.  Right Ear: External ear normal.  Left Ear: External ear normal.  Nose: Nose normal.  Mouth/Throat: Oropharynx is clear and moist.  Eyes: EOM are normal.  Neck: Normal range of motion. Neck supple.  Cardiovascular: Normal rate, normal heart sounds and intact distal pulses.   Pulmonary/Chest: Effort normal and breath sounds normal. No respiratory distress. She has no wheezes. She has no rales. She exhibits no tenderness.  Abdominal: Bowel sounds are normal.  Musculoskeletal: Normal range of motion.  Neurological: She is alert and oriented to person, place, and time.  Skin: Skin is warm and dry.    ED Course  Procedures (including critical care time) Results for orders placed during the hospital encounter of 07/23/11  URINALYSIS, ROUTINE W REFLEX MICROSCOPIC      Component Value Range   Color, Urine YELLOW  YELLOW    Appearance CLEAR  CLEAR    Specific Gravity, Urine 1.010  1.005 - 1.030    pH 6.0  5.0 - 8.0    Glucose, UA NEGATIVE  NEGATIVE (mg/dL)   Hgb urine dipstick TRACE (*) NEGATIVE    Bilirubin Urine NEGATIVE  NEGATIVE    Ketones, ur TRACE (*) NEGATIVE (mg/dL)   Protein, ur NEGATIVE  NEGATIVE (mg/dL)   Urobilinogen, UA 0.2  0.0 - 1.0 (mg/dL)   Nitrite NEGATIVE  NEGATIVE    Leukocytes, UA NEGATIVE  NEGATIVE   URINE MICROSCOPIC-ADD ON      Component Value Range   Squamous Epithelial / LPF FEW (*) RARE    WBC, UA 0-2  <3 (WBC/hpf)   RBC / HPF 0-2  <3 (RBC/hpf)   Bacteria, UA RARE  RARE   BASIC METABOLIC PANEL      Component Value Range   Sodium 138  135 - 145  (mEq/L)   Potassium 2.7 (*) 3.5 - 5.1 (mEq/L)   Chloride 101  96 - 112 (mEq/L)   CO2 24  19 - 32 (mEq/L)   Glucose, Bld 94  70 - 99 (mg/dL)   BUN  15  6 - 23 (mg/dL)   Creatinine, Ser 0.98  0.50 - 1.10 (mg/dL)   Calcium 9.1  8.4 - 11.9 (mg/dL)   GFR calc non Af Amer 67 (*) >90 (mL/min)   GFR calc Af Amer 78 (*) >90 (mL/min)  CBC      Component Value Range   WBC 7.8  4.0 - 10.5 (K/uL)   RBC 4.14  3.87 - 5.11 (MIL/uL)   Hemoglobin 13.4  12.0 - 15.0 (g/dL)   HCT 14.7  82.9 - 56.2 (%)   MCV 93.5  78.0 - 100.0 (fL)   MCH 32.4  26.0 - 34.0 (pg)   MCHC 34.6  30.0 - 36.0 (g/dL)   RDW 13.0  86.5 - 78.4 (%)   Platelets 280  150 - 400 (K/uL)  DIFFERENTIAL      Component Value Range   Neutrophils Relative 50  43 - 77 (%)   Neutro Abs 3.9  1.7 - 7.7 (K/uL)   Lymphocytes Relative 40  12 - 46 (%)   Lymphs Abs 3.1  0.7 - 4.0 (K/uL)   Monocytes Relative 8  3 - 12 (%)   Monocytes Absolute 0.7  0.1 - 1.0 (K/uL)   Eosinophils Relative 2  0 - 5 (%)   Eosinophils Absolute 0.1  0.0 - 0.7 (K/uL)   Basophils Relative 0  0 - 1 (%)   Basophils Absolute 0.0  0.0 - 0.1 (K/uL)  HEPATIC FUNCTION PANEL      Component Value Range   Total Protein 7.0  6.0 - 8.3 (g/dL)   Albumin 3.2 (*) 3.5 - 5.2 (g/dL)   AST 21  0 - 37 (U/L)   ALT 26  0 - 35 (U/L)   Alkaline Phosphatase 63  39 - 117 (U/L)   Total Bilirubin 0.3  0.3 - 1.2 (mg/dL)   Bilirubin, Direct <6.9  0.0 - 0.3 (mg/dL)   Indirect Bilirubin NOT CALCULATED  0.3 - 0.9 (mg/dL)    Dg Abd Acute W/chest  07/24/2011  *RADIOLOGY REPORT*  Clinical Data: Epigastric abdominal pain for 2 weeks.  Nausea and vomiting.  ACUTE ABDOMEN SERIES (ABDOMEN 2 VIEW & CHEST 1 VIEW)  Comparison: 12/16/2010  Findings: Clips project over the left breast and axilla.  The lungs appear clear.  Cardiac and mediastinal contours appear unremarkable.  The bowel gas pattern appears unremarkable.  Lower lumbar spondylosis noted.  IMPRESSION:  1.  The bowel gas pattern appears  unremarkable. 2.  Lower lumbar spondylosis.  Original Report Authenticated By: Dellia Cloud, M.D.      MDM  Patient with over 10 days of cough, sinus congestion, nausea and vomiting. Has been on antibiotic Levaquin which she stopped on her own.GIven IVF, taken PO fluids. Labs with hypokalemia which was repleted. Remainder of labs unremarkable, xray without acute findings. Patient feels better. Pt feels improved after observation and/or treatment in ED.Pt stable in ED with no significant deterioration in condition.The patient appears reasonably screened and/or stabilized for discharge and I doubt any other medical condition or other [facility] requiring further screening, evaluation, or treatment in the ED at this time prior to discharge. MDM Reviewed: nursing note and vitals Interpretation: labs and x-ray           Nicoletta Dress. Colon Branch, MD 07/24/11 [id number]  Nicoletta Dress. Colon Branch, MD 07/24/11 [phone]

## 2011-07-24 NOTE — ED Notes (Signed)
Critical lab called k+ 2.7 dr strand notified

## 2011-07-26 ENCOUNTER — Telehealth: Payer: Self-pay | Admitting: Family Medicine

## 2011-07-26 ENCOUNTER — Emergency Department (HOSPITAL_COMMUNITY)
Admission: EM | Admit: 2011-07-26 | Discharge: 2011-07-26 | Discharge status: Against Medical Advice | Payer: BC Managed Care – PPO | Attending: Emergency Medicine | Admitting: Emergency Medicine

## 2011-07-26 ENCOUNTER — Emergency Department (HOSPITAL_COMMUNITY): Payer: BC Managed Care – PPO

## 2011-07-26 ENCOUNTER — Encounter (HOSPITAL_COMMUNITY): Payer: Self-pay

## 2011-07-26 DIAGNOSIS — K589 Irritable bowel syndrome without diarrhea: Secondary | ICD-10-CM | POA: Insufficient documentation

## 2011-07-26 DIAGNOSIS — R197 Diarrhea, unspecified: Secondary | ICD-10-CM | POA: Insufficient documentation

## 2011-07-26 DIAGNOSIS — F329 Major depressive disorder, single episode, unspecified: Secondary | ICD-10-CM | POA: Insufficient documentation

## 2011-07-26 DIAGNOSIS — R1013 Epigastric pain: Secondary | ICD-10-CM | POA: Insufficient documentation

## 2011-07-26 DIAGNOSIS — R05 Cough: Secondary | ICD-10-CM | POA: Insufficient documentation

## 2011-07-26 DIAGNOSIS — F17200 Nicotine dependence, unspecified, uncomplicated: Secondary | ICD-10-CM | POA: Insufficient documentation

## 2011-07-26 DIAGNOSIS — R5381 Other malaise: Secondary | ICD-10-CM | POA: Insufficient documentation

## 2011-07-26 DIAGNOSIS — Z9010 Acquired absence of unspecified breast and nipple: Secondary | ICD-10-CM | POA: Insufficient documentation

## 2011-07-26 DIAGNOSIS — K219 Gastro-esophageal reflux disease without esophagitis: Secondary | ICD-10-CM | POA: Insufficient documentation

## 2011-07-26 DIAGNOSIS — R059 Cough, unspecified: Secondary | ICD-10-CM | POA: Insufficient documentation

## 2011-07-26 DIAGNOSIS — H8109 Meniere's disease, unspecified ear: Secondary | ICD-10-CM | POA: Insufficient documentation

## 2011-07-26 DIAGNOSIS — R1110 Vomiting, unspecified: Secondary | ICD-10-CM | POA: Insufficient documentation

## 2011-07-26 DIAGNOSIS — F3289 Other specified depressive episodes: Secondary | ICD-10-CM | POA: Insufficient documentation

## 2011-07-26 DIAGNOSIS — Z853 Personal history of malignant neoplasm of breast: Secondary | ICD-10-CM | POA: Insufficient documentation

## 2011-07-26 DIAGNOSIS — R1032 Left lower quadrant pain: Secondary | ICD-10-CM | POA: Insufficient documentation

## 2011-07-26 NOTE — ED Notes (Signed)
Became sick w/ cold--that has gotten better, cont. To have nausea, vomiting and diarrhea x2 weeks, denies fever.

## 2011-07-26 NOTE — ED Notes (Signed)
Pt walked out of department, no belongings left in room.

## 2011-07-26 NOTE — Telephone Encounter (Signed)
Pt needs ed f/u in the next week, I wll review report then, has not been in since July

## 2011-07-26 NOTE — ED Provider Notes (Signed)
History     CSN: [id number] Arrival date & time: 07/26/2011  1:38 AM   First MD Initiated Contact with Patient 07/26/11 0216      Chief Complaint  Patient presents with  . Nausea  . Emesis  . Diarrhea    (Consider location/radiation/quality/duration/timing/severity/associated sxs/prior treatment) HPI Comments: Seen 0216. Patient has returned to the ER 48 hours after being seen for the same c/o. She has had abdominal pain, nausea, vomiting and generalized malaise since being treated for a URI two weeks ago. She was treated with Levaquin which she is convinced is responsible for her current illness. She developed epigastric and LUQ discomfort, nausea, and post tussive vomiting. She felt tired. She denies weight loss, fever, chest pain, shortness of breath. When seen in the ER two days ago she was given IVF, antiemetic, analgesic. Labs were unremarkable except for low potassium, which was repleted and a Rx for continued therapy provided. She was to follow up with her PCP. She states there has been no improvement in her illness. She continues to have abdominal pain. She denies any further vomiting however she states she has not eaten since she was here because she is afraid she will have vomiting. She has used the antiemetic and has had little nausea.  Patient is a 50 y.o. female presenting with vomiting. The history is provided by the patient and the spouse.  Emesis  This is a recurrent (Patient seen here two days ago for the same c/o.) problem. The current episode started more than 1 week ago. The problem has not changed since onset.There has been no fever. Associated symptoms include abdominal pain, cough and diarrhea. Pertinent negatives include no arthralgias, no chills, no fever, no headaches, no myalgias, no sweats and no URI. Associated symptoms comments: No weight loss.    Past Medical History  Diagnosis Date  . Hx of breast cancer 2007  . S/P partial mastectomy     left   .  History of radiation therapy   . History of cancer chemotherapy     currently on tamoxifen  . Sleep apnea 2010    probs with C Pap  . IBS (irritable bowel syndrome)   . GERD (gastroesophageal reflux disease)   . Cancer   . Headache   . Menieres disease     controlled with triamterene   . Breast cancer   . Pituitary insufficiency   . IBS (irritable bowel syndrome)   . Migraine   . Depression   . Obesity     Past Surgical History  Procedure Date  . Breast surgery 2007    left lumpectomy  . Thyroidectomy, partial 11/05/2010    benign disease  . Abdominal hysterectomy 1998    benign, fibroids  . Appendectomy 06/12/08  . Cholecystectomy 1985    Family History  Problem Relation Age of Onset  . Diabetes Mother   . Heart disease Father     History  Substance Use Topics  . Smoking status: Current Everyday Smoker    Types: Cigarettes  . Smokeless tobacco: Never Used  . Alcohol Use: No    OB History    Grav Para Term Preterm Abortions TAB SAB Ect Mult Living                  Review of Systems  Constitutional: Positive for fatigue. Negative for fever and chills.  Respiratory: Positive for cough.   Gastrointestinal: Positive for nausea, vomiting, abdominal pain and diarrhea.  Musculoskeletal: Negative for myalgias  and arthralgias.  Neurological: Negative for headaches.  All other systems reviewed and are negative.    Allergies  Penicillins  Home Medications   Current Outpatient Rx  Name Route Sig Dispense Refill  . POTASSIUM CHLORIDE CRYS CR 20 MEQ PO TBCR Oral Take 1 tablet (20 mEq total) by mouth daily. 4 tablet 0  . PROMETHAZINE HCL 25 MG RE SUPP Rectal Place 1 suppository (25 mg total) rectally every 6 (six) hours as needed for nausea. 12 each 0  . PROMETHAZINE HCL 25 MG PO TABS Oral Take 0.5 tablets (12.5 mg total) by mouth every 6 (six) hours as needed for nausea. 10 tablet 0  . TRIAMTERENE-HCTZ 37.5-25 MG PO CAPS Oral Take 1 capsule by mouth daily. One  tab by mouth everyday  30 capsule 6  . DICYCLOMINE HCL 20 MG PO TABS Oral Take 20 mg by mouth 2 (two) times daily.      Marland Kitchen DICYCLOMINE HCL PO Oral Take 20 mg by mouth daily.      Marland Kitchen DIPHENOXYLATE-ATROPINE 2.5-0.025 MG PO TABS Oral Take 1 tablet by mouth 2 (two) times daily. One tab by mouth twice daily       . LORAZEPAM 1 MG PO TABS Oral Take 1 mg by mouth as needed.     Marland Kitchen METFORMIN HCL ER (OSM) 500 MG PO TB24 Oral Take 500 mg by mouth daily.     Marland Kitchen NAPROXEN SODIUM 550 MG PO TABS Oral Take 550 mg by mouth as needed. As needed       . PANTOPRAZOLE SODIUM 40 MG PO TBEC Oral Take 40 mg by mouth 2 (two) times daily.     Marland Kitchen TAMOXIFEN CITRATE 10 MG PO TABS Oral Take 10 mg by mouth 2 (two) times daily.      Marland Kitchen TAMOXIFEN CITRATE PO Oral Take 10 mg by mouth 2 (two) times daily. One tablet by mouth two times a day     . TRAZODONE HCL 50 MG PO TABS Oral Take 50 mg by mouth at bedtime.        BP 99/27  Pulse 86  Temp(Src) 98.5 F (36.9 C) (Oral)  Resp 20  Ht 5\' 7"  (1.702 m)  Wt 201 lb (91.173 kg)  BMI 31.48 kg/m2  SpO2 100%  Physical Exam  Nursing note and vitals reviewed. Constitutional: She is oriented to person, place, and time. She appears well-developed and well-nourished. No distress.  HENT:  Head: Normocephalic and atraumatic.  Eyes: EOM are normal.  Neck: Normal range of motion.  Cardiovascular: Normal rate, normal heart sounds and intact distal pulses.   Pulmonary/Chest: Effort normal and breath sounds normal.  Abdominal: Soft. She exhibits no distension and no mass. There is no rebound and no guarding.       Mild epigastric tenderness  Musculoskeletal: Normal range of motion.  Neurological: She is alert and oriented to person, place, and time. She has normal reflexes.  Skin: Skin is warm and dry.    ED Course  Procedures (including critical care time)  Labs Reviewed - No data to display Ct Abdomen Pelvis Wo Contrast  07/26/2011  *RADIOLOGY REPORT*  Clinical Data: Epigastric  abdominal pain, bilateral flank pain, nausea and vomiting.  CT ABDOMEN AND PELVIS WITHOUT CONTRAST  Technique:  Multidetector CT imaging of the abdomen and pelvis was performed following the standard protocol without intravenous contrast.  Comparison: CT of the abdomen and pelvis performed 12/16/2010, and abdominal radiographs performed 07/24/2011  Findings: Minimal bibasilar atelectasis is noted.  The liver and spleen are unremarkable in appearance.  The patient is status post cholecystectomy.  Bilateral adrenal adenomas are again noted; the large left-sided adrenal adenoma has increased mildly in size, measuring approximately 3.7 cm, while the right adrenal adenoma remains relatively stable, at 1.3 cm.  There is a vague 0.9 cm focus of decreased attenuation at the interpole region of the right kidney, not well characterized but likely reflecting a cyst given its appearance on the prior CT.  The kidneys are otherwise unremarkable in appearance.  There is no evidence of hydronephrosis.  No renal or ureteral stones are seen. No perinephric stranding is appreciated.  No free fluid is identified.  The small bowel is unremarkable in appearance.  The stomach is within normal limits.  No acute vascular abnormalities are seen.  The patient is status post appendectomy.  The colon is largely decompressed and is unremarkable in appearance.  The bladder is mildly distended and grossly unremarkable in appearance.  The patient is status post hysterectomy.  No suspicious adnexal masses are seen.  No inguinal lymphadenopathy is seen.  No acute osseous abnormalities are identified.  Vacuum phenomenon and disc space narrowing are noted at L4-L5 and L5-S1, with associated endplate sclerotic change.  IMPRESSION:  1.  No acute abnormalities identified within the abdomen or pelvis. 2.  Bilateral adrenal adenomas again noted, slightly increased in size on the left side. 3.  Likely small right renal cyst again seen. 4.  Degenerative  change at the lower lumbar spine.  Original Report Authenticated By: Tonia Ghent, M.D.   Results for orders placed during the hospital encounter of 07/23/11  URINALYSIS, ROUTINE W REFLEX MICROSCOPIC      Component Value Range   Color, Urine YELLOW  YELLOW    Appearance CLEAR  CLEAR    Specific Gravity, Urine 1.010  1.005 - 1.030    pH 6.0  5.0 - 8.0    Glucose, UA NEGATIVE  NEGATIVE (mg/dL)   Hgb urine dipstick TRACE (*) NEGATIVE    Bilirubin Urine NEGATIVE  NEGATIVE    Ketones, ur TRACE (*) NEGATIVE (mg/dL)   Protein, ur NEGATIVE  NEGATIVE (mg/dL)   Urobilinogen, UA 0.2  0.0 - 1.0 (mg/dL)   Nitrite NEGATIVE  NEGATIVE    Leukocytes, UA NEGATIVE  NEGATIVE   URINE MICROSCOPIC-ADD ON      Component Value Range   Squamous Epithelial / LPF FEW (*) RARE    WBC, UA 0-2  <3 (WBC/hpf)   RBC / HPF 0-2  <3 (RBC/hpf)   Bacteria, UA RARE  RARE   BASIC METABOLIC PANEL      Component Value Range   Sodium 138  135 - 145 (mEq/L)   Potassium 2.7 (*) 3.5 - 5.1 (mEq/L)   Chloride 101  96 - 112 (mEq/L)   CO2 24  19 - 32 (mEq/L)   Glucose, Bld 94  70 - 99 (mg/dL)   BUN 15  6 - 23 (mg/dL)   Creatinine, Ser 2.95  0.50 - 1.10 (mg/dL)   Calcium 9.1  8.4 - 62.1 (mg/dL)   GFR calc non Af Amer 67 (*) >90 (mL/min)   GFR calc Af Amer 78 (*) >90 (mL/min)  CBC      Component Value Range   WBC 7.8  4.0 - 10.5 (K/uL)   RBC 4.14  3.87 - 5.11 (MIL/uL)   Hemoglobin 13.4  12.0 - 15.0 (g/dL)   HCT 30.8  65.7 - 84.6 (%)   MCV 93.5  78.0 -  100.0 (fL)   MCH 32.4  26.0 - 34.0 (pg)   MCHC 34.6  30.0 - 36.0 (g/dL)   RDW 04.5  40.9 - 81.1 (%)   Platelets 280  150 - 400 (K/uL)  DIFFERENTIAL      Component Value Range   Neutrophils Relative 50  43 - 77 (%)   Neutro Abs 3.9  1.7 - 7.7 (K/uL)   Lymphocytes Relative 40  12 - 46 (%)   Lymphs Abs 3.1  0.7 - 4.0 (K/uL)   Monocytes Relative 8  3 - 12 (%)   Monocytes Absolute 0.7  0.1 - 1.0 (K/uL)   Eosinophils Relative 2  0 - 5 (%)   Eosinophils Absolute 0.1  0.0 -  0.7 (K/uL)   Basophils Relative 0  0 - 1 (%)   Basophils Absolute 0.0  0.0 - 0.1 (K/uL)  HEPATIC FUNCTION PANEL      Component Value Range   Total Protein 7.0  6.0 - 8.3 (g/dL)   Albumin 3.2 (*) 3.5 - 5.2 (g/dL)   AST 21  0 - 37 (U/L)   ALT 26  0 - 35 (U/L)   Alkaline Phosphatase 63  39 - 117 (U/L)   Total Bilirubin 0.3  0.3 - 1.2 (mg/dL)   Bilirubin, Direct <9.1  0.0 - 0.3 (mg/dL)   Indirect Bilirubin NOT CALCULATED  0.3 - 0.9 (mg/dL)   Ct Abdomen Pelvis Wo Contrast  07/26/2011  *RADIOLOGY REPORT*  Clinical Data: Epigastric abdominal pain, bilateral flank pain, nausea and vomiting.  CT ABDOMEN AND PELVIS WITHOUT CONTRAST  Technique:  Multidetector CT imaging of the abdomen and pelvis was performed following the standard protocol without intravenous contrast.  Comparison: CT of the abdomen and pelvis performed 12/16/2010, and abdominal radiographs performed 07/24/2011  Findings: Minimal bibasilar atelectasis is noted.  The liver and spleen are unremarkable in appearance.  The patient is status post cholecystectomy.  Bilateral adrenal adenomas are again noted; the large left-sided adrenal adenoma has increased mildly in size, measuring approximately 3.7 cm, while the right adrenal adenoma remains relatively stable, at 1.3 cm.  There is a vague 0.9 cm focus of decreased attenuation at the interpole region of the right kidney, not well characterized but likely reflecting a cyst given its appearance on the prior CT.  The kidneys are otherwise unremarkable in appearance.  There is no evidence of hydronephrosis.  No renal or ureteral stones are seen. No perinephric stranding is appreciated.  No free fluid is identified.  The small bowel is unremarkable in appearance.  The stomach is within normal limits.  No acute vascular abnormalities are seen.  The patient is status post appendectomy.  The colon is largely decompressed and is unremarkable in appearance.  The bladder is mildly distended and grossly  unremarkable in appearance.  The patient is status post hysterectomy.  No suspicious adnexal masses are seen.  No inguinal lymphadenopathy is seen.  No acute osseous abnormalities are identified.  Vacuum phenomenon and disc space narrowing are noted at L4-L5 and L5-S1, with associated endplate sclerotic change.  IMPRESSION:  1.  No acute abnormalities identified within the abdomen or pelvis. 2.  Bilateral adrenal adenomas again noted, slightly increased in size on the left side. 3.  Likely small right renal cyst again seen. 4.  Degenerative change at the lower lumbar spine.  Original Report Authenticated By: Tonia Ghent, M.D.   Dg Abd Acute W/chest  07/24/2011  *RADIOLOGY REPORT*  Clinical Data: Epigastric abdominal pain for 2 weeks.  Nausea and vomiting.  ACUTE ABDOMEN SERIES (ABDOMEN 2 VIEW & CHEST 1 VIEW)  Comparison: 12/16/2010  Findings: Clips project over the left breast and axilla.  The lungs appear clear.  Cardiac and mediastinal contours appear unremarkable.  The bowel gas pattern appears unremarkable.  Lower lumbar spondylosis noted.  IMPRESSION:  1.  The bowel gas pattern appears unremarkable. 2.  Lower lumbar spondylosis.  Original Report Authenticated By: Dellia Cloud, M.D.     No diagnosis found.    MDM  0225 Reviewed previous visit, labs and abdominal series with patientReviewed CT previous CT. Reviewed EGD and small bowel follow through initiated by Dr. Karilyn Cota showing a sliding hiatal hernia. She agreed to CT tonight.No further labs will be drawn. 0430 Advised by nursing that patient had left AMA. Patient did not wait for CT results.  MDM Reviewed: previous chart, nursing note and vitals Reviewed previous: labs, x-ray and CT scan (Previous CT, EGD, small bowel study) Interpretation: CT scan           Nicoletta Dress. Colon Branch, MD 07/27/11 1120

## 2011-07-27 NOTE — Telephone Encounter (Signed)
Left message

## 2011-08-03 ENCOUNTER — Encounter: Payer: Self-pay | Admitting: Family Medicine

## 2011-08-05 ENCOUNTER — Encounter: Payer: Self-pay | Admitting: Family Medicine

## 2011-08-05 ENCOUNTER — Ambulatory Visit (INDEPENDENT_AMBULATORY_CARE_PROVIDER_SITE_OTHER): Payer: BC Managed Care – PPO | Admitting: Family Medicine

## 2011-08-05 DIAGNOSIS — E669 Obesity, unspecified: Secondary | ICD-10-CM

## 2011-08-05 DIAGNOSIS — E119 Type 2 diabetes mellitus without complications: Secondary | ICD-10-CM

## 2011-08-05 DIAGNOSIS — K3189 Other diseases of stomach and duodenum: Secondary | ICD-10-CM

## 2011-08-05 DIAGNOSIS — E785 Hyperlipidemia, unspecified: Secondary | ICD-10-CM

## 2011-08-05 DIAGNOSIS — R112 Nausea with vomiting, unspecified: Secondary | ICD-10-CM | POA: Insufficient documentation

## 2011-08-05 DIAGNOSIS — D3500 Benign neoplasm of unspecified adrenal gland: Secondary | ICD-10-CM

## 2011-08-05 DIAGNOSIS — R110 Nausea: Secondary | ICD-10-CM | POA: Insufficient documentation

## 2011-08-05 DIAGNOSIS — R1013 Epigastric pain: Secondary | ICD-10-CM

## 2011-08-05 NOTE — Patient Instructions (Signed)
F/u as before.  I will get appt with Dr Karilyn Cota  In the very near future.  Phenergan will be prescribed for symptom relief.   i will speak with a surgeon to follow the adrenal adenomas and let you know whn you are referred to.  Chem7, h pylori and hBA1c today

## 2011-08-05 NOTE — Progress Notes (Signed)
  Subjective:    Patient ID: TALULA ISLAND, female    DOB: 12/14/1960, 50 y.o.   MRN: [id number]  HPI Pt reports that 10/6 she had sore throat seen in eD in [place], no med prescribed, however on 10/9 seen at [facility] since symptoms persisted, took 2 days of levaqiuin only, resp sympts cleared but constant nausea, bloating since then . She has had chronic GI complaints for which she has been seeing GI. States she was changed from omeprazole to pantoprazole to see if this made a difference by GI since the summer, she has not noted any improvement   Review of Systems See HPI  Denies current  sinus pressure, nasal congestion, ear pain or sore throat. Denies current  chest congestion, productive cough or wheezing. Denies chest pains, palpitations and leg swelling C/o uncontrolled upper abdominal  Pain and  Nausea,she denies  vomiting,diarrhea or constipation.   Denies dysuria, frequency, hesitancy or incontinence. Denies seizures, numbness, or tingling. Denies depression, anxiety or insomnia. Denies skin break down or rash.        Objective:   Physical Exam        Assessment & Plan:

## 2011-08-06 DIAGNOSIS — D3500 Benign neoplasm of unspecified adrenal gland: Secondary | ICD-10-CM | POA: Insufficient documentation

## 2011-08-06 LAB — BASIC METABOLIC PANEL
BUN: 11 mg/dL (ref 6–23)
CO2: 26 mEq/L (ref 19–32)
Calcium: 9.6 mg/dL (ref 8.4–10.5)
Chloride: 101 mEq/L (ref 96–112)
Creat: 0.89 mg/dL (ref 0.50–1.10)
Glucose, Bld: 89 mg/dL (ref 70–99)
Potassium: 3.8 mEq/L (ref 3.5–5.3)
Sodium: 141 mEq/L (ref 135–145)

## 2011-08-06 LAB — HEMOGLOBIN A1C
Hgb A1c MFr Bld: 6.1 % — ABNORMAL HIGH (ref <5.7)
Mean Plasma Glucose: 128 mg/dL — ABNORMAL HIGH (ref <117)

## 2011-08-06 LAB — H. PYLORI ANTIBODY, IGG: H Pylori IgG: <0.4 {ISR}

## 2011-08-06 NOTE — Assessment & Plan Note (Signed)
Low fat diet encouraged.  

## 2011-08-06 NOTE — Assessment & Plan Note (Signed)
Pt with bilateral adrenal adenomas, the left appears large will speak with a surgeon re f/u of this, pt aware

## 2011-08-06 NOTE — Assessment & Plan Note (Signed)
Pt gradually losing weight , and is aware of need to restrict carb intake, will f/u HBa1C

## 2011-08-06 NOTE — Assessment & Plan Note (Signed)
Severe , disabling and chronic, has been to the Ed with complaint, is being followed by GI and needs re-eval. Prescription provided for phenergan 25mg  as needed to last till GI re-eval. Will check H pylori

## 2011-08-06 NOTE — Assessment & Plan Note (Signed)
Improved. Pt applauded on succesful weight loss through lifestyle change, and encouraged to continue same. Weight loss goal set for the next several months.  

## 2011-08-12 ENCOUNTER — Encounter (INDEPENDENT_AMBULATORY_CARE_PROVIDER_SITE_OTHER): Payer: Self-pay | Admitting: *Deleted

## 2011-08-12 ENCOUNTER — Other Ambulatory Visit (INDEPENDENT_AMBULATORY_CARE_PROVIDER_SITE_OTHER): Payer: Self-pay | Admitting: Internal Medicine

## 2011-08-17 ENCOUNTER — Other Ambulatory Visit (INDEPENDENT_AMBULATORY_CARE_PROVIDER_SITE_OTHER): Payer: Self-pay | Admitting: General Surgery

## 2011-08-17 ENCOUNTER — Ambulatory Visit (INDEPENDENT_AMBULATORY_CARE_PROVIDER_SITE_OTHER): Payer: BC Managed Care – PPO | Admitting: General Surgery

## 2011-08-17 ENCOUNTER — Encounter (INDEPENDENT_AMBULATORY_CARE_PROVIDER_SITE_OTHER): Payer: Self-pay | Admitting: General Surgery

## 2011-08-17 VITALS — BP 116/80 | HR 68 | Temp 97.8°F | Resp 16 | Ht 66.0 in | Wt 207.6 lb

## 2011-08-17 DIAGNOSIS — E278 Other specified disorders of adrenal gland: Secondary | ICD-10-CM

## 2011-08-17 DIAGNOSIS — E279 Disorder of adrenal gland, unspecified: Secondary | ICD-10-CM

## 2011-08-17 NOTE — Patient Instructions (Signed)
I will call you with test results  Return no later than six months with repeat CT scan

## 2011-08-17 NOTE — Progress Notes (Signed)
Subjective:   Bilateral adrenal masses  Patient ID: Annette [name], female   DOB: March 05, 1961, 50 y.o.   MRN: [id number]  HPI Patient is a 50 year old female referred by Dr. Lodema Hong for evaluation for bilateral adrenal masses. The patient has a history of chronic and recurring abdominal pain. She has had an extensive workup including 2 previous CT scans of the abdomen, one in March and one in October of this year. She has also had upper endoscopy and has been evaluated by Dr. Karilyn Cota. CT in March showed bilateral benign appearing adrenal masses 3 cm on the left and 1 cm on the right. Recent followup CT scan has revealed some enlargement of the left adrenal mass. My review shows the initial scan measured 3.2 cm on the left and now measures 3.7 cm on the left but the measurements were taken somewhat differently. The mass on the right remains about 1 cm. The patient has no history of paroxysmal hypertension or electrolyte abnormalities. She continues to have some intermittent epigastric pain and persistent bloating as she has had throughout. Past Medical History  Diagnosis Date  . Hx of breast cancer 2007  . S/P partial mastectomy     left   . History of radiation therapy   . History of cancer chemotherapy     currently on tamoxifen  . Sleep apnea 2010    probs with C Pap  . IBS (irritable bowel syndrome)   . GERD (gastroesophageal reflux disease)   . Cancer   . Headache   . Menieres disease     controlled with triamterene   . Breast cancer   . Pituitary insufficiency   . IBS (irritable bowel syndrome)   . Migraine   . Depression   . Obesity   . Thyroid disease   . Diabetes mellitus     prediabetic  . Migraines   . Palpitations   . Bruises easily   . Nausea & vomiting   . Diarrhea   . Abdominal pain   . Weakness    Past Surgical History  Procedure Date  . Thyroidectomy, partial 11/05/2010    benign disease  . Appendectomy 06/12/08  . Cholecystectomy 1985  . Breast surgery  2007    left lumpectomy  . Breast reconstruction     left reconstructive  . Breast surgery     mammosite - right side  . Abdominal hysterectomy 1998    benign, fibroids   Current Outpatient Prescriptions  Medication Sig Dispense Refill  . omeprazole (PRILOSEC) 20 MG capsule 20 mg daily. Will be taken in place of the Protonix as of 08/19/11.      Marland Kitchen promethazine (PHENERGAN) 25 MG tablet Twice daily.      Marland Kitchen dicyclomine (BENTYL) 20 MG tablet TAKE ONE TABLET BY MOUTH TWO TIMES A DAY  60 tablet  5  . diphenoxylate-atropine (LOMOTIL) 2.5-0.025 MG per tablet Take 1 tablet by mouth 2 (two) times daily. One tab by mouth twice daily         . LORazepam (ATIVAN) 1 MG tablet Take 1 mg by mouth as needed.       . metformin (FORTAMET) 500 MG (OSM) 24 hr tablet Take 500 mg by mouth daily.       . naproxen sodium (ANAPROX) 550 MG tablet Take 550 mg by mouth as needed. As needed         . pantoprazole (PROTONIX) 40 MG tablet Take 40 mg by mouth 2 (two) times daily.       Marland Kitchen  potassium chloride SA (K-DUR,KLOR-CON) 20 MEQ tablet Take 1 tablet (20 mEq total) by mouth daily.  4 tablet  0  . tamoxifen (NOLVADEX) 10 MG tablet Take 10 mg by mouth 2 (two) times daily.        Marland Kitchen TAMOXIFEN CITRATE PO Take 10 mg by mouth 2 (two) times daily. One tablet by mouth two times a day       . traZODone (DESYREL) 50 MG tablet Take 50 mg by mouth at bedtime.        . triamterene-hydrochlorothiazide (DYAZIDE) 37.5-25 MG per capsule Take 1 capsule by mouth daily. One tab by mouth everyday   30 capsule  6   Allergies  Allergen Reactions  . Penicillins Swelling    Of face and tongue.   History  Substance Use Topics  . Smoking status: Current Everyday Smoker -- 0.3 packs/day    Types: Cigarettes  . Smokeless tobacco: Never Used  . Alcohol Use: No     Review of Systems  Constitutional: Positive for fatigue.  Cardiovascular: Positive for palpitations and leg swelling.  Gastrointestinal: Positive for nausea, abdominal  pain, diarrhea, constipation and abdominal distention.       Objective:   Physical Exam General: Moderately obese Caucasian female in no acute distress Skin: Warm and dry without rash or infection HEENT: No palpable mass or thyromegaly. Sclera nonicteric. Oropharynx clear. Lymph nodes: No cervical, supraclavicular, or axillary nodes palpable Lungs: Clear without wheeze or increased work of breathing Breasts: Status post reduction on the right and reconstruction on the left without masses or skin changes or other evidence of recurrent cancer Cardiovascular: Regular rate and rhythm without murmur. No edema. Abdomen: Long healed upper midline incision. Mild epigastric tenderness. No discernible masses or organomegaly. No hernias. Extremities: Mild superficial varicosities lower extremities. No joint swelling or deformity. Neurologic: Alert and fully oriented. Gait normal.    Assessment:     50 year old female with a number of ongoing medical problems and bilateral adrenal masses noted incidentally on CT scan in March. There is dense appearing mild enlargement of the mass on the left to 3.7 cm. It has generally benign characteristics. I'm not sure there has been a significant change since March. However the mass is large enough to be of some concern. We will check urine for catecholamines and steroid levels. I am inclined to recommend continued observation for now due to the above factors but we'll get an MRI to confirm benign characteristics. If the MRI is of concern I would recommend adrenalectomy. If there is any significant enlargement over the next 6 months or abnormalities in her endocrine workup this would likely indicate adrenalectomy as well. The patient had some chronic abdominal pain unrelated to her adrenal masses which has been thoroughly evaluated and was seen consistent with irritable bowel syndrome. This was all discussed with her.    Plan:     We'll check plasma steroid levels and  urine for catecholamines. MRI of the adrenal masses. If both of these evaluations are indicative of benign process and nonfunctioning adenoma I would recommend repeat CT in 6 months.    Mariella Saa MD, FACS  08/17/2011, 2:20 PM

## 2011-08-18 LAB — CORTISOL: Cortisol, Plasma: 14.6 ug/dL

## 2011-08-20 LAB — ALDOSTERONE: Aldosterone, Serum: 23 ng/dL

## 2011-08-23 ENCOUNTER — Ambulatory Visit (INDEPENDENT_AMBULATORY_CARE_PROVIDER_SITE_OTHER): Payer: BC Managed Care – PPO | Admitting: Internal Medicine

## 2011-08-23 ENCOUNTER — Encounter (INDEPENDENT_AMBULATORY_CARE_PROVIDER_SITE_OTHER): Payer: Self-pay | Admitting: Internal Medicine

## 2011-08-23 VITALS — BP 90/62 | HR 76 | Temp 98.6°F | Ht 66.0 in | Wt 208.0 lb

## 2011-08-23 DIAGNOSIS — K219 Gastro-esophageal reflux disease without esophagitis: Secondary | ICD-10-CM

## 2011-08-23 DIAGNOSIS — R110 Nausea: Secondary | ICD-10-CM

## 2011-08-23 NOTE — Progress Notes (Signed)
Subjective:     Patient ID: Annette [name], female   DOB: 1961-09-20, 50 y.o.   MRN: [id number]  HPI Annette [name] is here today for a scheduled visit. She tells me the 1st of October she had nausea and vomiting. She presented to the ED. She underwent a CT of the abdomen and pelvis without CM. Revealed 1. No acute abnormalities identified within the abdomen or pelvis.  2. Bilateral adrenal adenomas again noted, slightly increased in size on the left side.  3. Likely small right renal cyst again seen.  4. Degenerative change at the lower lumbar spine.  She continues to have nausea but no vomiting in a couple of weeks. She has lost 8 pounds sine her last visit in July.  She says overall her nausea is about the same. She tells me that she has epigastric pain. It feels like someone has hit her.  Frequent acid reflux.  The Prilosec is not controlling her symptoms. Appetite is good most of the time.  She tells me the Ativan did not help her. She has nausea every day. She has nausea before and after meals. She has a BM about once every 3 days. Stools are formed. No diarrhea.  Stools are much better.  No melena or bright red rectal bleeding. She is using the Phenergan anywhere from -3 times a day.  H.pylori negative in past.  She is seeing a Dr. Johna Sheriff for her adrenal adenomas.  07/24/11 AST 21, ALT 26, ALP 63.  08/17/2011 Cortisol 14.6, Aldosterone 23.  EGD 02/24/2011 FINAL DIAGNOSIS: Small sliding hiatal hernia; otherwise, normal  esophagogastroduodenoscopy. 04/22/2011: Cecliac panel negative, Vitamin B12 397 Colonoscopy and EGD in October 2010: 3 small polyps removed and they were adenomas. Biopsy from GE junction was negative for Barrett's. Review of Systems see hpi Current Outpatient Prescriptions  Medication Sig Dispense Refill  . dicyclomine (BENTYL) 20 MG tablet TAKE ONE TABLET BY MOUTH TWO TIMES A DAY  60 tablet  5  . diphenoxylate-atropine (LOMOTIL) 2.5-0.025 MG per tablet Take 1 tablet by mouth 2  (two) times daily. One tab by mouth twice daily         . LORazepam (ATIVAN) 1 MG tablet Take 1 mg by mouth as needed.       . metformin (FORTAMET) 500 MG (OSM) 24 hr tablet Take 500 mg by mouth daily.       . naproxen sodium (ANAPROX) 550 MG tablet Take 550 mg by mouth as needed. As needed         . omeprazole (PRILOSEC) 20 MG capsule 20 mg daily. Will be taken in place of the Protonix as of 08/19/11.      Marland Kitchen potassium chloride SA (K-DUR,KLOR-CON) 20 MEQ tablet Take 1 tablet (20 mEq total) by mouth daily.  4 tablet  0  . promethazine (PHENERGAN) 25 MG tablet Twice daily.      . tamoxifen (NOLVADEX) 10 MG tablet Take 10 mg by mouth 2 (two) times daily.        . traZODone (DESYREL) 50 MG tablet Take 50 mg by mouth at bedtime.        . triamterene-hydrochlorothiazide (DYAZIDE) 37.5-25 MG per capsule Take 1 capsule by mouth daily. One tab by mouth everyday   30 capsule  6   History   Social History  . Marital Status: Married    Spouse Name: N/A    Number of Children: N/A  . Years of Education: N/A   Occupational History  . Not on  file.   Social History Main Topics  . Smoking status: Current Everyday Smoker -- 0.3 packs/day    Types: Cigarettes  . Smokeless tobacco: Never Used   Comment: 1/2 pack a day  . Alcohol Use: No     Encouraged to quit smoking. She has tried the nicotone gum and patches but did not help  . Drug Use: No  . Sexually Active: Yes    Birth Control/ Protection: Surgical   Other Topics Concern  . Not on file   Social History Narrative  . No narrative on file   Past Medical History  Diagnosis Date  . Hx of breast cancer 2007  . S/P partial mastectomy     left   . History of radiation therapy   . History of cancer chemotherapy     currently on tamoxifen  . Sleep apnea 2010    probs with C Pap  . IBS (irritable bowel syndrome)   . GERD (gastroesophageal reflux disease)   . Cancer   . Headache   . Menieres disease     controlled with triamterene   .  Breast cancer   . Pituitary insufficiency   . IBS (irritable bowel syndrome)   . Migraine   . Depression   . Obesity   . Thyroid disease   . Diabetes mellitus     prediabetic  . Migraines   . Palpitations   . Bruises easily   . Nausea & vomiting   . Diarrhea   . Abdominal pain   . Weakness    History   Social History  . Marital Status: Married    Spouse Name: N/A    Number of Children: N/A  . Years of Education: N/A   Occupational History  . Not on file.   Social History Main Topics  . Smoking status: Current Everyday Smoker -- 0.3 packs/day    Types: Cigarettes  . Smokeless tobacco: Never Used   Comment: 1/2 pack a day  . Alcohol Use: No     Encouraged to quit smoking. She has tried the nicotone gum and patches but did not help  . Drug Use: No  . Sexually Active: Yes    Birth Control/ Protection: Surgical   Other Topics Concern  . Not on file   Social History Narrative  . No narrative on file   Family Status  Relation Status Death Age  . Mother Alive   . Father Alive   . Paternal Grandmother Deceased   . Sister Alive     3 good health.   . Brother Alive     One deceased with severe diabetes. One iin pretty good health       Objective:   Physical Exam Filed Vitals:   08/23/11 1428  BP: 90/62  Pulse: 76  Temp: 98.6 F (37 C)  Height: 5\' 6"  (1.676 m)  Weight: 208 lb (94.348 kg)    Alert and oriented. Skin warm and dry. Oral mucosa is moist. Natural teeth in good condition. Sclera anicteric, conjunctivae is pink. Thyroid not enlarged. No cervical lymphadenopathy. Lungs clear. Heart regular rate and rhythm.  Abdomen is soft. Bowel sounds are positive. No hepatomegaly. No abdominal masses felt. No tenderness.  No edema to lower extremities. Patient is alert and oriented.      Assessment:    GERD not controlled at this time with Prilosec. Nausea which has not worsened. Ativan did not help with her nausea.     Plan:  Will give samples of  Dexilant for her to try  (5 boxes). Continue with the Phenergan as needed.   OV 6 months. PR in 2 weeks.

## 2011-08-23 NOTE — Patient Instructions (Addendum)
PR in 2 weeks. Dexilant samples given to patient to try. Continue with the Phenergan. OV in a6 months.

## 2011-08-25 ENCOUNTER — Ambulatory Visit
Admission: RE | Admit: 2011-08-25 | Discharge: 2011-08-25 | Disposition: A | Discharge status: Home / Self Care | Payer: BC Managed Care – PPO | Source: Ambulatory Visit | Attending: General Surgery | Admitting: General Surgery

## 2011-08-25 DIAGNOSIS — E278 Other specified disorders of adrenal gland: Secondary | ICD-10-CM

## 2011-08-25 LAB — CATECHOLAMINES, FRACTIONATED, URINE, 24 HOUR
Calculated Total (E+NE): 43 mcg/24 h (ref 26–121)
Creatinine, Urine mg/day-CATEUR: 1.34 g/(24.h) (ref 0.63–2.50)
Dopamine, 24 hr Urine: 139 mcg/24 h (ref 52–480)
Epinephrine, 24 hr Urine: 4 mcg/24 h (ref 2–24)
Norepinephrine, 24 hr Ur: 39 mcg/24 h (ref 15–100)
Total Volume - CF 24Hr U: 1300 mL

## 2011-08-25 LAB — METANEPHRINES, URINE, 24 HOUR
Metaneph Total, Ur: 422 mcg/24 h (ref 224–832)
Metanephrines, Ur: 104 mcg/24 h (ref 90–315)
Normetanephrine, 24H Ur: 318 mcg/24 h (ref 122–676)

## 2011-08-25 MED ORDER — GADOBENATE DIMEGLUMINE 529 MG/ML IV SOLN
19.0000 mL | Freq: Once | INTRAVENOUS | Status: AC | PRN
  Administered 2011-08-25: 19 mL via INTRAVENOUS

## 2011-09-02 ENCOUNTER — Other Ambulatory Visit: Payer: Self-pay | Admitting: Family Medicine

## 2011-09-02 NOTE — Progress Notes (Signed)
Left voice message for patient to call regarding her lab results/cm11/29/12

## 2011-09-07 ENCOUNTER — Telehealth: Payer: Self-pay | Admitting: Family Medicine

## 2011-09-08 NOTE — Telephone Encounter (Signed)
Called patient left message

## 2011-09-08 NOTE — Telephone Encounter (Signed)
I have no specific answer, I have been getting help from the specialists I think will help to provide answers, and help with management, will discuss further at next visit

## 2011-09-08 NOTE — Telephone Encounter (Signed)
She said she is sick and tired of feeling this way and was really upset that she had to go through the holidays like this. Said please, can't something be done before next month

## 2011-09-10 NOTE — Telephone Encounter (Signed)
I left a message for the pt to return my call, please get me to the phone if she calls back

## 2011-09-17 NOTE — Telephone Encounter (Signed)
Pt never called back.

## 2011-09-20 ENCOUNTER — Telehealth: Payer: Self-pay | Admitting: Family Medicine

## 2011-09-20 ENCOUNTER — Telehealth (INDEPENDENT_AMBULATORY_CARE_PROVIDER_SITE_OTHER): Payer: Self-pay | Admitting: *Deleted

## 2011-09-20 ENCOUNTER — Other Ambulatory Visit: Payer: Self-pay

## 2011-09-20 MED ORDER — OMEPRAZOLE 20 MG PO CPDR: 20.0000 mg | DELAYED_RELEASE_CAPSULE | Freq: Every day | ORAL | Status: DC

## 2011-09-20 MED ORDER — PROMETHAZINE HCL 25 MG PO TABS: 25.0000 mg | ORAL_TABLET | Freq: Two times a day (BID) | ORAL | Status: DC

## 2011-09-20 NOTE — Telephone Encounter (Signed)
The Dexilant samples given to her didn't help. Is there something else she could try. The return phone number is [phone].  Her PCP sent her to a surgeon and the growth on her adrenial gland had grew a little. He did an MRI and 24 hour urine test. She is going back in 6 months. The surgeon is part of [facility] and Terri should be able to get the records.   

## 2011-09-20 NOTE — Telephone Encounter (Signed)
The Dexilant samples given to her didn't help. Is there something else she could try. The return phone number is [phone].  Her PCP sent her to a surgeon and the growth on her adrenial gland had grew a little. He did an MRI and 24 hour urine test. She is going back in 6 months. The surgeon is part of [facility] and Camelia Eng should be able to get the records.

## 2011-09-23 NOTE — Telephone Encounter (Signed)
Message left home

## 2011-09-24 NOTE — Telephone Encounter (Signed)
Message left at home 

## 2011-10-06 ENCOUNTER — Other Ambulatory Visit: Payer: Self-pay

## 2011-10-06 ENCOUNTER — Telehealth: Payer: Self-pay | Admitting: Family Medicine

## 2011-10-06 MED ORDER — OMEPRAZOLE 20 MG PO CPDR: 20.0000 mg | DELAYED_RELEASE_CAPSULE | Freq: Two times a day (BID) | ORAL | Status: DC

## 2011-10-06 NOTE — Telephone Encounter (Signed)
Error in system. Sent in to pharmacy for #60. 4 refills. Note on rx says to add 30 to previous refill.

## 2011-10-07 ENCOUNTER — Other Ambulatory Visit (INDEPENDENT_AMBULATORY_CARE_PROVIDER_SITE_OTHER): Payer: Self-pay | Admitting: Internal Medicine

## 2011-10-18 ENCOUNTER — Other Ambulatory Visit: Payer: Self-pay

## 2011-10-18 ENCOUNTER — Telehealth: Payer: Self-pay | Admitting: Family Medicine

## 2011-10-18 DIAGNOSIS — E785 Hyperlipidemia, unspecified: Secondary | ICD-10-CM

## 2011-10-18 DIAGNOSIS — E039 Hypothyroidism, unspecified: Secondary | ICD-10-CM

## 2011-10-18 DIAGNOSIS — E119 Type 2 diabetes mellitus without complications: Secondary | ICD-10-CM

## 2011-10-18 NOTE — Telephone Encounter (Signed)
advsie feb 1 or after HBA1C lipid cmp and EGFR and microalb, no appt before lab , needs to resched if she has sooner appt pls

## 2011-10-18 NOTE — Telephone Encounter (Signed)
appt 1/17. Does she need labs before?

## 2011-10-18 NOTE — Telephone Encounter (Signed)
Spoke with pt and appointment cancelled and changed.  She is aware of lab work and labs mailed.

## 2011-10-21 ENCOUNTER — Ambulatory Visit: Payer: BC Managed Care – PPO | Admitting: Family Medicine

## 2011-10-25 ENCOUNTER — Telehealth (INDEPENDENT_AMBULATORY_CARE_PROVIDER_SITE_OTHER): Payer: Self-pay | Admitting: *Deleted

## 2011-10-25 NOTE — Telephone Encounter (Signed)
LM stating her pharmacy has faxed a refill authorization for TraZOdone and they have not received it back. Please send this in.

## 2011-10-25 NOTE — Telephone Encounter (Signed)
Request rec'd from the pharmacy, and will be faxed back to them.

## 2011-11-16 ENCOUNTER — Ambulatory Visit: Payer: BC Managed Care – PPO | Admitting: Family Medicine

## 2011-11-22 ENCOUNTER — Other Ambulatory Visit: Payer: Self-pay | Admitting: Family Medicine

## 2011-11-23 ENCOUNTER — Ambulatory Visit: Payer: BC Managed Care – PPO | Admitting: Family Medicine

## 2011-11-23 ENCOUNTER — Telehealth: Payer: Self-pay | Admitting: Family Medicine

## 2011-11-23 ENCOUNTER — Ambulatory Visit (INDEPENDENT_AMBULATORY_CARE_PROVIDER_SITE_OTHER): Payer: BC Managed Care – PPO | Admitting: Internal Medicine

## 2011-11-23 MED ORDER — TRIAMTERENE-HCTZ 37.5-25 MG PO CAPS: ORAL_CAPSULE | ORAL | Status: DC

## 2011-11-23 NOTE — Telephone Encounter (Signed)
Med refilled as requested

## 2011-11-24 ENCOUNTER — Other Ambulatory Visit: Payer: Self-pay | Admitting: Family Medicine

## 2011-11-25 LAB — LIPID PANEL
Cholesterol: 171 mg/dL (ref 0–200)
HDL: 41 mg/dL (ref >39)
LDL Cholesterol: 113 mg/dL — ABNORMAL HIGH (ref 0–99)
Total CHOL/HDL Ratio: 4.2 Ratio
Triglycerides: 86 mg/dL (ref <150)
VLDL: 17 mg/dL (ref 0–40)

## 2011-11-25 LAB — COMPLETE METABOLIC PANEL WITH GFR
ALT: 21 U/L (ref 0–35)
AST: 16 U/L (ref 0–37)
Albumin: 3.7 g/dL (ref 3.5–5.2)
Alkaline Phosphatase: 60 U/L (ref 39–117)
BUN: 14 mg/dL (ref 6–23)
CO2: 26 mEq/L (ref 19–32)
Calcium: 9 mg/dL (ref 8.4–10.5)
Chloride: 108 mEq/L (ref 96–112)
Creat: 0.84 mg/dL (ref 0.50–1.10)
GFR, Est African American: >89 mL/min
GFR, Est Non African American: 80 mL/min
Glucose, Bld: 109 mg/dL — ABNORMAL HIGH (ref 70–99)
Potassium: 3.9 mEq/L (ref 3.5–5.3)
Sodium: 144 mEq/L (ref 135–145)
Total Bilirubin: 0.2 mg/dL — ABNORMAL LOW (ref 0.3–1.2)
Total Protein: 5.9 g/dL — ABNORMAL LOW (ref 6.0–8.3)

## 2011-11-25 LAB — HEMOGLOBIN A1C
Hgb A1c MFr Bld: 6.1 % — ABNORMAL HIGH (ref <5.7)
Mean Plasma Glucose: 128 mg/dL — ABNORMAL HIGH (ref <117)

## 2011-11-25 LAB — MICROALBUMIN / CREATININE URINE RATIO
Creatinine, Urine: 237.6 mg/dL
Microalb Creat Ratio: 2.1 mg/g (ref 0.0–30.0)
Microalb, Ur: 0.5 mg/dL (ref 0.00–1.89)

## 2011-11-26 NOTE — Telephone Encounter (Signed)
Pt aware.

## 2011-11-29 ENCOUNTER — Ambulatory Visit (INDEPENDENT_AMBULATORY_CARE_PROVIDER_SITE_OTHER): Payer: BC Managed Care – PPO | Admitting: Family Medicine

## 2011-11-29 ENCOUNTER — Telehealth: Payer: Self-pay | Admitting: Family Medicine

## 2011-11-29 ENCOUNTER — Encounter: Payer: Self-pay | Admitting: Family Medicine

## 2011-11-29 ENCOUNTER — Other Ambulatory Visit (HOSPITAL_COMMUNITY): Payer: Self-pay | Admitting: Oncology

## 2011-11-29 VITALS — BP 112/78 | HR 88 | Resp 15 | Ht 66.0 in | Wt 213.1 lb

## 2011-11-29 DIAGNOSIS — C50919 Malignant neoplasm of unspecified site of unspecified female breast: Secondary | ICD-10-CM

## 2011-11-29 DIAGNOSIS — R110 Nausea: Secondary | ICD-10-CM

## 2011-11-29 DIAGNOSIS — E119 Type 2 diabetes mellitus without complications: Secondary | ICD-10-CM

## 2011-11-29 DIAGNOSIS — E785 Hyperlipidemia, unspecified: Secondary | ICD-10-CM

## 2011-11-29 DIAGNOSIS — Z139 Encounter for screening, unspecified: Secondary | ICD-10-CM

## 2011-11-29 DIAGNOSIS — M949 Disorder of cartilage, unspecified: Secondary | ICD-10-CM

## 2011-11-29 DIAGNOSIS — E669 Obesity, unspecified: Secondary | ICD-10-CM

## 2011-11-29 DIAGNOSIS — M899 Disorder of bone, unspecified: Secondary | ICD-10-CM

## 2011-11-29 DIAGNOSIS — R0789 Other chest pain: Secondary | ICD-10-CM

## 2011-11-29 MED ORDER — LORAZEPAM 1 MG PO TABS: 1.0000 mg | ORAL_TABLET | ORAL | Status: DC | PRN

## 2011-11-29 MED ORDER — PRAVASTATIN SODIUM 20 MG PO TABS: 20.0000 mg | ORAL_TABLET | Freq: Every evening | ORAL | Status: DC

## 2011-11-29 NOTE — Assessment & Plan Note (Signed)
Dietary management only, chronic nausea, also commitment to regular physical activity and weight  loss

## 2011-11-29 NOTE — Assessment & Plan Note (Signed)
Reports this is chronic, hopefully , off of metformin, symptom will improve

## 2011-11-29 NOTE — Assessment & Plan Note (Signed)
Improved, but LDL still elevated, addition of a stain as well as dietary change would be benficial, pt will be contacted after visit about this

## 2011-11-29 NOTE — Assessment & Plan Note (Signed)
Several month h/o central chest pain, radiates across chest, father CABG at 53, diabetic, hyperlipidemia, nicotine , ekg today and card referral

## 2011-11-29 NOTE — Progress Notes (Signed)
  Subjective:    Patient ID: Annette [name], female    DOB: September 30, 1961, 51 y.o.   MRN: [id number]  HPI The PT is here for follow up and re-evaluation of chronic medical conditions, medication management and review of any available recent lab and radiology data.  Preventive health is updated, specifically  Cancer screening and Immunization.   Questions or concerns regarding consultations or procedures which the PT has had in the interim are  Addressed.She did see a Careers adviser re an adrenal tumor, has 6 month f/u scheduled, but hopes to go sooner, as she expects to be out of work in the near future C/o bruising , which is only on her forearms, nothing to add, and on chart review in her presence , she had a similar concern voiced with heme/onc in the Fall. Intermittent chest pain for years. Has had negative stress test in the past 2 to 3 years. Reports increased frequency of the pain, she has personal risk factors for CVD; DM, hyperlipidemia, HTN , nicotine use, and father had CAD at age 52 (CABG x4 at 48)Pain occurs at rest, no c/o radiation or light headedness The PT denies any adverse reactions to current medications since the last visit.  Father died with dementia and was buried this past weekend, pt is clearly distressed over this    Review of Systems See HPI Denies recent fever or chills. Denies sinus pressure, nasal congestion, ear pain or sore throat. Denies chest congestion, productive cough or wheezing. Denies  palpitations and leg swelling Denies abdominal pain, nausea, vomiting,diarrhea or constipation.   Denies dysuria, frequency, hesitancy or incontinence. Denies joint pain, swelling and limitation in mobility. Denies headaches, seizures, numbness, or tingling. Denies depression, anxiety or insomnia.         Objective:   Physical Exam Patient alert and oriented and in no cardiopulmonary distress.  HEENT: No facial asymmetry, EOMI, no sinus tenderness,  oropharynx pink  and moist.  Neck supple no adenopathy.  Chest: Clear to auscultation bilaterally.decreased air entry throughout Noreproducible chest wall pain  CVS: S1, S2 no murmurs, no S3. EKG: NSR, no ischemic changes  ABD: Soft non tender. Bowel sounds normal.  Ext: No edema  MS: Adequate ROM spine, shoulders, hips and knees.  Skin: Intact, no ulcerations or rash noted.erythematous macular rash in patches of forearms  Psych: Good eye contact, normal affect. Memory intact not anxious or depressed appearing.  CNS: CN 2-12 intact, power, tone and sensation normal throughout.        Assessment & Plan:

## 2011-11-29 NOTE — Assessment & Plan Note (Signed)
Deteriorated. Patient re-educated about  the importance of commitment to a  minimum of 150 minutes of exercise per week. The importance of healthy food choices with portion control discussed. Encouraged to start a food diary, count calories and to consider  joining a support group. Sample diet sheets offered. Goals set by the patient for the next several months.    

## 2011-11-29 NOTE — Patient Instructions (Addendum)
CPE in 4.5 month.  You will be referred to cardiology for further evaluation of  your chest pain.The EKG today is normal, but based on family history and personal risk factors you are being referred  Congrats in improvement of your cholesterol  You need to stop smoking.Commit to reducing by 1 cigarette every 2 weeks. When you get to 7 /day , you may substitute with the patch of the lowest strength for 2 weeks. No cigarettes at that time. The patch is over the counter.  EKG today STOP metformin, I believe your blood sugars can be controled with changes in your eating habits as we discussed. Your nausea may improve off of the metformin also   HBA1C and vit D in 4.5 month  Pls start one multivitamin once daily , and also one OTC vit D tablet 600IU or 800IU once daily  Please call the surgeon regarding your concerns about surgery  You can call for your appt with Dr Mariel Sleet, he wanted you back in 6 months which will be in April, since you are already his patitent we do not need to refer. Ca;l if you have problems.  My condolences regarding the recent loss of your father

## 2011-11-30 MED ORDER — PROMETHAZINE HCL 25 MG PO TABS: 25.0000 mg | ORAL_TABLET | Freq: Two times a day (BID) | ORAL | Status: DC

## 2011-11-30 NOTE — Telephone Encounter (Signed)
Asking for refill of naproxen. What is on med list just says to use as directed. What directions do I put and how many?

## 2011-12-01 ENCOUNTER — Other Ambulatory Visit: Payer: Self-pay | Admitting: Family Medicine

## 2011-12-02 MED ORDER — NAPROXEN SODIUM 550 MG PO TABS: ORAL_TABLET | ORAL | Status: DC

## 2011-12-02 NOTE — Telephone Encounter (Signed)
Pt aware.

## 2011-12-02 NOTE — Telephone Encounter (Signed)
pls document from pt why she needs the med , I will send in some with more info, thanks

## 2011-12-02 NOTE — Telephone Encounter (Signed)
Called patient and left message.

## 2011-12-02 NOTE — Telephone Encounter (Signed)
Medication sent in pls let her know, if you need to call back

## 2011-12-02 NOTE — Telephone Encounter (Signed)
She uses the Naproxen 550mg  for headache Having them sometimes 2-3 days in a row and sometimes she can go for a week without them. Just depends. Requesting refill

## 2011-12-06 ENCOUNTER — Ambulatory Visit (INDEPENDENT_AMBULATORY_CARE_PROVIDER_SITE_OTHER): Payer: BC Managed Care – PPO | Admitting: Cardiology

## 2011-12-06 ENCOUNTER — Encounter: Payer: Self-pay | Admitting: Cardiology

## 2011-12-06 ENCOUNTER — Telehealth: Payer: Self-pay | Admitting: Family Medicine

## 2011-12-06 VITALS — BP 123/74 | HR 92 | Ht 66.0 in | Wt 213.0 lb

## 2011-12-06 DIAGNOSIS — E119 Type 2 diabetes mellitus without complications: Secondary | ICD-10-CM

## 2011-12-06 DIAGNOSIS — R0789 Other chest pain: Secondary | ICD-10-CM

## 2011-12-06 DIAGNOSIS — E785 Hyperlipidemia, unspecified: Secondary | ICD-10-CM

## 2011-12-06 DIAGNOSIS — R079 Chest pain, unspecified: Secondary | ICD-10-CM

## 2011-12-06 NOTE — Assessment & Plan Note (Signed)
To start on Pravachol per Dr. Lodema Hong.

## 2011-12-06 NOTE — Assessment & Plan Note (Signed)
Followed by Dr. Simpson. 

## 2011-12-06 NOTE — Progress Notes (Signed)
Clinical Summary Annette [name] is a 51 y.o.female referred for cardiology consultation by Dr Lodema Hong. She reports a longstanding history of recurrent chest pain for years. She states that it typically occurs at night, is not usually exertional. Sometimes goes up into neck.  She states that she had some type of exercise stress test a few years ago at [facility] with chest pain - reassuring by report.  ECG is reviewed below.  She uses antiacids with some improvement, not always however.   Allergies  Allergen Reactions  . Penicillins Swelling    Of face and tongue.    Current Outpatient Prescriptions  Medication Sig Dispense Refill  . dicyclomine (BENTYL) 20 MG tablet TAKE ONE TABLET BY MOUTH TWO TIMES A DAY  60 tablet  5  . diphenoxylate-atropine (LOMOTIL) 2.5-0.025 MG per tablet TAKE ONE TABLET BY MOUTH TWO TIMES A DAY MUST LAST 30 DAYS  60 tablet  3  . LORazepam (ATIVAN) 1 MG tablet Take 1 tablet (1 mg total) by mouth as needed.  30 tablet  3  . naproxen sodium (ANAPROX DS) 550 MG tablet One tablet twice daily, as neded for headache  40 tablet  1  . omeprazole (PRILOSEC) 20 MG capsule Take 1 capsule (20 mg total) by mouth 2 (two) times daily. Will be taken in place of the Protonix as of 08/19/11.  60 capsule  4  . pravastatin (PRAVACHOL) 20 MG tablet Take 1 tablet (20 mg total) by mouth every evening.  30 tablet  11  . promethazine (PHENERGAN) 25 MG tablet Take 1 tablet (25 mg total) by mouth 2 (two) times daily.  30 tablet  0  . traZODone (DESYREL) 50 MG tablet Take 50 mg by mouth at bedtime.        . triamterene-hydrochlorothiazide (DYAZIDE) 37.5-25 MG per capsule TAKE ONE CAPSULE BY MOUTH EVERY DAY  30 capsule  1    Past Medical History  Diagnosis Date  . Sleep apnea 2010    Problems with CPAP  . GERD (gastroesophageal reflux disease)   . Menieres disease     Controlled with triamterene   . Breast cancer     History of  XRT, onTamoxifen  . Pituitary insufficiency   . IBS  (irritable bowel syndrome)   . Depression   . Obesity   . Hypothyroidism   . Diabetes mellitus     prediabetic  . Migraines   . Palpitations   . Bruises easily     Past Surgical History  Procedure Date  . Thyroidectomy, partial 11/05/2010    Benign disease  . Appendectomy 06/12/08  . Cholecystectomy 1985  . Breast surgery 2007    Left lumpectomy  . Breast reconstruction     Left reconstructive  . Breast surgery     Mammosite - right side  . Abdominal hysterectomy 1998    Benign, fibroids    Family History  Problem Relation Age of Onset  . Diabetes Mother   . Heart disease Father   . Cancer Paternal Grandmother     Breast    Social History Annette [name] reports that she has been smoking Cigarettes.  She has been smoking about .3 packs per day. She has never used smokeless tobacco. Annette [name] reports that she does not drink alcohol.  Review of Systems No palpitations or syncope. No nausea or emesis. Breathing nonlabored. Otherwise negative.  Physical Examination Filed Vitals:   12/06/11 1456  BP: 123/74  Pulse: 92   Overweight woman in NAD.  HEENT: Conjunctiva and lids normal, oropharynx clear with moist mucosa. Neck: Supple, no elevated JVP or carotid bruits, no thyromegaly. Lungs: Clear to auscultation, nonlabored breathing at rest. Cardiac: Regular rate and rhythm, no S3 or significant systolic murmur, no pericardial rub. Abdomen: Soft, nontender, bowel sounds present, no guarding or rebound. Extremities: No pitting edema, distal pulses 2+. Skin: Warm and dry. Musculoskeletal: No kyphosis. Neuropsychiatric: Alert and oriented x3, affect grossly appropriate.   ECG Sinus rhythm, normal.    Problem List and Plan

## 2011-12-06 NOTE — Patient Instructions (Signed)
**Note De-Identified  Obfuscation** Your physician recommends that you continue on your current medications as directed. Please refer to the Current Medication list given to you today.  Your physician has requested that you have a stress echocardiogram. For further information please visit [email or web address] Please follow instruction sheet as given.  Your physician recommends that you schedule a follow-up appointment in: we will contact you with results of test.

## 2011-12-06 NOTE — Assessment & Plan Note (Signed)
Longstanding history as noted. ECG is normal. She reports two more intense episodes recently. Cardiac risk factors include glucose intolerance and family history of premature CAD. Plan to evaluate with an exercise echocardiogram. Will inform her of the results.

## 2011-12-07 NOTE — Telephone Encounter (Signed)
Called patient and left message for them to return call at the office   

## 2011-12-07 NOTE — Telephone Encounter (Signed)
States she wants a referral to [facility] regarding the knot on her adrenal gland. She left the phone number in the first msg.

## 2011-12-09 ENCOUNTER — Other Ambulatory Visit: Payer: Self-pay | Admitting: Family Medicine

## 2011-12-09 DIAGNOSIS — D3500 Benign neoplasm of unspecified adrenal gland: Secondary | ICD-10-CM

## 2011-12-09 NOTE — Telephone Encounter (Signed)
pls refer pt to surgeon at [facility], per pt request, for 2nd opinion regarding adrenal adenoma, pls fax the ct abdomen report, let her know you are working on this please

## 2011-12-15 NOTE — Telephone Encounter (Signed)
Appointment with Dr  Duanne Guess in [place]13 @ 1:00 patient is aware they will mail out papers for patient

## 2011-12-16 ENCOUNTER — Ambulatory Visit (HOSPITAL_COMMUNITY)
Admission: RE | Admit: 2011-12-16 | Discharge: 2011-12-16 | Disposition: A | Discharge status: Home / Self Care | Payer: BC Managed Care – PPO | Source: Ambulatory Visit | Attending: Cardiology | Admitting: Cardiology

## 2011-12-16 DIAGNOSIS — E785 Hyperlipidemia, unspecified: Secondary | ICD-10-CM | POA: Insufficient documentation

## 2011-12-16 DIAGNOSIS — R072 Precordial pain: Secondary | ICD-10-CM

## 2011-12-16 DIAGNOSIS — R079 Chest pain, unspecified: Secondary | ICD-10-CM | POA: Insufficient documentation

## 2011-12-16 DIAGNOSIS — C50919 Malignant neoplasm of unspecified site of unspecified female breast: Secondary | ICD-10-CM | POA: Insufficient documentation

## 2011-12-16 DIAGNOSIS — R0789 Other chest pain: Secondary | ICD-10-CM

## 2011-12-16 DIAGNOSIS — E119 Type 2 diabetes mellitus without complications: Secondary | ICD-10-CM | POA: Insufficient documentation

## 2011-12-16 NOTE — Progress Notes (Signed)
Stress Lab Nurses Notes - Annette [name]  Annette [name] 12/16/2011  Reason for doing test: Chest Pain Type of test: Stress Echo Nurse performing test: Parke Poisson, RN Nuclear Medicine Tech: Not Applicable Echo Tech: Karrie Doffing MD performing test: Ival Bible & Joni Reining NP Family MD: Lodema Hong Test explaine and consent signed: yes IV started: No IV started Symptoms: Fatigue  Treatment/Intervention: None Reason test stopped: fatigue After recovery IV was: NA Patient to return to Nuc. Med at : NA Patient discharged: Home Patient's Condition upon discharge was: stable Comments: During test peak BP 152/58 & HR 158.  Recovery BP 108/48 & HR 102.  Symptoms resolved in recovery. [name] T

## 2011-12-16 NOTE — Progress Notes (Signed)
*  PRELIMINARY RESULTS* Echocardiogram Echocardiogram Stress Test has been performed.  Conrad [place] 12/16/2011, 12:30 PM2

## 2011-12-27 ENCOUNTER — Other Ambulatory Visit: Payer: Self-pay | Admitting: Family Medicine

## 2011-12-27 DIAGNOSIS — Z139 Encounter for screening, unspecified: Secondary | ICD-10-CM

## 2011-12-30 ENCOUNTER — Encounter: Payer: Self-pay | Admitting: Family Medicine

## 2011-12-31 DIAGNOSIS — E890 Postprocedural hypothyroidism: Secondary | ICD-10-CM | POA: Insufficient documentation

## 2012-01-05 ENCOUNTER — Encounter (HOSPITAL_COMMUNITY): Payer: BC Managed Care – PPO

## 2012-01-10 ENCOUNTER — Encounter (HOSPITAL_COMMUNITY): Payer: BC Managed Care – PPO | Attending: Oncology | Admitting: Oncology

## 2012-01-10 VITALS — BP 101/68 | HR 86 | Temp 97.9°F | Wt 221.5 lb

## 2012-01-10 DIAGNOSIS — C50419 Malignant neoplasm of upper-outer quadrant of unspecified female breast: Secondary | ICD-10-CM

## 2012-01-10 DIAGNOSIS — J449 Chronic obstructive pulmonary disease, unspecified: Secondary | ICD-10-CM

## 2012-01-10 DIAGNOSIS — C50919 Malignant neoplasm of unspecified site of unspecified female breast: Secondary | ICD-10-CM

## 2012-01-10 DIAGNOSIS — Z170 Estrogen receptor positive status [ER+]: Secondary | ICD-10-CM

## 2012-01-10 NOTE — Progress Notes (Signed)
This office note has been dictated.

## 2012-01-10 NOTE — Patient Instructions (Signed)
Annette [name]  [id number] Dec 23, 1960   [facility]  Discharge Instructions  RECOMMENDATIONS MADE BY THE CONSULTANT AND ANY TEST RESULTS WILL BE SENT TO YOUR REFERRING DOCTOR.   EXAM FINDINGS BY MD TODAY AND SIGNS AND SYMPTOMS TO REPORT TO CLINIC OR PRIMARY MD: You are doing well.  Ask your surgeon to send [place] a note.  (our fax number is [phone])  MEDICATIONS PRESCRIBED: none   INSTRUCTIONS GIVEN AND DISCUSSED: Other :  Report any new lumps, bone pain or shortness of breath.  SPECIAL INSTRUCTIONS/FOLLOW-UP: Return to Clinic in 1 year.   I acknowledge that I have been informed and understand all the instructions given to me and received a copy. I do not have any more questions at this time, but understand that I may call the Specialty Clinic at [facility] at [phone] during business hours should I have any further questions or need assistance in obtaining follow-up care.    __________________________________________  _____________  __________ Signature of Patient or Authorized Representative            Date                   Time    __________________________________________ Nurse's Signature

## 2012-01-10 NOTE — Progress Notes (Signed)
CC:   Annette [name]. Lodema Hong, M.D. Annette [name], M.D.  DIAGNOSES: 1. 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  No     LVI was seen.  Ki-67 marker was 37%, and I treated with dose dense     Taxotere and Cytoxan via chemotherapy for 6 cycles and Herceptin     for 52 weeks.  She then had radiation therapy after the completion     of chemotherapy.  She finished tamoxifen after 5 years of therapy     in November 2012.  She presented here for the first time February     2007. 2. Pituitary insufficiency, seeing an endocrinologist in [facility]. 3. Bilateral adrenal adenomas and she states she is going to have     surgery this May to make sure that they are benign though her     workup thus far indicates that they are benign. 4. Irritable bowel syndrome with EGD and colonoscopy in 2011 by Dr.     Karilyn Cota. 5. Migraine headaches. 6. Appendectomy in September 2009 by Dr. Justice Britain. 7. Depression, on Lamictal. 8. Obesity, now weighing 221 pounds still. 9. Chronic obstructive pulmonary disease secondary to longstanding     smoking history, still smoking. [name] has had a major workup after presenting to the emergency room in October with some nausea, abdominal discomfort/chest discomfort.  She had a CT scan of the abdomen and pelvis it sounds like and that revealed some lesions on the adrenal glands which led to further consultation with Dr. Jaclynn Guarneri in [place].  Those were all negative.  She had an MRI of the abdomen which suggested absolutely that this was just adrenal adenomas but insisted on a second opinion with a Dr. Cathie Beams at [facility] who has seen her.  Reluctantly Dr. Lady Gary is going to do a surgical procedure it sounds like in May.  They will go after the largest adrenal adenoma on the left.  That is  said to be approximately 3.5 x 2.8 cm.  REVIEW OF SYSTEMS:  Oncologically is otherwise negative.  She did lose her father last month from it sounds like recurrent pneumonia, he was 106 and very demented.  She herself has stable vital signs.  No lymphadenopathy.  Left breast shows radiation therapy and surgical changes.  Much less tenderness than in the past.  The right breast is negative except for fibroglandular changes.  There are no masses.  No adenopathy.  Lungs are clear but with diminished breath sounds.  Heart shows a regular rhythm and rate without murmur, rub or gallop.  Abdomen is obese, nontender without organomegaly.  Bowel sounds are diminished. She has no peripheral edema.  We will see her back in a year, sooner if need be.  I do not need to do any blood work.  She has had quite an extensive workup thus far, and I will not repeat that.    ______________________________ Ladona Horns. Mariel Sleet, MD ESN/MEDQ  D:  01/10/2012  T:  01/10/2012  Job:  [id number]

## 2012-01-19 ENCOUNTER — Telehealth: Payer: Self-pay | Admitting: Family Medicine

## 2012-01-19 ENCOUNTER — Ambulatory Visit (HOSPITAL_COMMUNITY)
Admission: RE | Admit: 2012-01-19 | Discharge: 2012-01-19 | Disposition: A | Discharge status: Home / Self Care | Payer: BC Managed Care – PPO | Source: Ambulatory Visit | Attending: Family Medicine | Admitting: Family Medicine

## 2012-01-19 DIAGNOSIS — Z139 Encounter for screening, unspecified: Secondary | ICD-10-CM

## 2012-01-19 DIAGNOSIS — Z853 Personal history of malignant neoplasm of breast: Secondary | ICD-10-CM | POA: Insufficient documentation

## 2012-01-19 DIAGNOSIS — Z9889 Other specified postprocedural states: Secondary | ICD-10-CM | POA: Insufficient documentation

## 2012-01-19 NOTE — Telephone Encounter (Signed)
No, she will not. Needs OV

## 2012-01-19 NOTE — Telephone Encounter (Signed)
Left message for pt

## 2012-01-20 ENCOUNTER — Encounter: Payer: Self-pay | Admitting: Family Medicine

## 2012-01-20 ENCOUNTER — Ambulatory Visit (INDEPENDENT_AMBULATORY_CARE_PROVIDER_SITE_OTHER): Payer: BC Managed Care – PPO | Admitting: Family Medicine

## 2012-01-20 VITALS — BP 112/80 | HR 95 | Resp 16 | Ht 66.0 in | Wt 219.4 lb

## 2012-01-20 DIAGNOSIS — N3000 Acute cystitis without hematuria: Secondary | ICD-10-CM | POA: Insufficient documentation

## 2012-01-20 DIAGNOSIS — N390 Urinary tract infection, site not specified: Secondary | ICD-10-CM

## 2012-01-20 DIAGNOSIS — E785 Hyperlipidemia, unspecified: Secondary | ICD-10-CM

## 2012-01-20 LAB — POCT URINALYSIS DIPSTICK
Bilirubin, UA: NEGATIVE
Glucose, UA: NEGATIVE
Ketones, UA: NEGATIVE
Nitrite, UA: NEGATIVE
Spec Grav, UA: 1.02
Urobilinogen, UA: 0.2
pH, UA: 7.5

## 2012-01-20 MED ORDER — CIPROFLOXACIN HCL 500 MG PO TABS: 500.0000 mg | ORAL_TABLET | Freq: Two times a day (BID) | ORAL | Status: AC

## 2012-01-20 MED ORDER — FLUCONAZOLE 150 MG PO TABS: ORAL_TABLET | ORAL | Status: AC

## 2012-01-20 NOTE — Progress Notes (Signed)
  Subjective:    Patient ID: Annette [name], female    DOB: [birth year], 51 y.o.   MRN: [id number]  HPI 1 week h/o dysuria and frequency, deneis fever , chills or flank pain. Denies hemauria.   Review of Systems See HPI Denies recent fever or chills. Denies sinus pressure, nasal congestion, ear pain or sore throat. Denies chest congestion, productive cough or wheezing. Denies headaches, seizures, numbness, or tingling. Denies depression, anxiety or insomnia. Denies skin break down or rash.        Objective:   Physical Exam Patient alert and oriented and in no cardiopulmonary distress.  HEENT: No facial asymmetry, EOMI, no sinus tenderness,  oropharynx pink and moist.  Neck supple no adenopathy.  Chest: Clear to auscultation bilaterally.  CVS: S1, S2 no murmurs, no S3.  ABD: Soft non tender. Bowel sounds normal.No renal angle tenderness  Ext: No edema   Psych: Good eye contact, normal affect. Memory intact not anxious or depressed appearing.  CNS: CN 2-12 intact, power normal throughout.        Assessment & Plan:

## 2012-01-20 NOTE — Patient Instructions (Signed)
F/u as before.  Please call if you need me before  You are being treated for a bladder infection.  Labs before next visit please.  It is important that you exercise regularly at least 30 minutes 5 times a week. If you develop chest pain, have severe difficulty breathing, or feel very tired, stop exercising immediately and seek medical attention   A healthy diet is rich in fruit, vegetables and whole grains. Poultry fish, nuts and beans are a healthy choice for protein rather then red meat. A low sodium diet and drinking 64 ounces of water daily is generally recommended. Oils and sweet should be limited. Carbohydrates especially for those who are diabetic or overweight, should be limited to 30-45 gram per meal. It is important to eat on a regular schedule, at least 3 times daily. Snacks should be primarily fruits, vegetables or nuts.

## 2012-01-22 NOTE — Assessment & Plan Note (Signed)
Hyperlipidemia:Low fat diet discussed and encouraged.  Elevated LDL, no change in medication at this time  

## 2012-01-22 NOTE — Assessment & Plan Note (Signed)
Symptomatic with abnormal CCUA, med prescribed, will f/u on c/s

## 2012-01-23 LAB — URINE CULTURE: Colony Count: >=100000

## 2012-02-16 DIAGNOSIS — D62 Acute posthemorrhagic anemia: Secondary | ICD-10-CM

## 2012-02-16 HISTORY — DX: Acute posthemorrhagic anemia: D62

## 2012-02-16 HISTORY — PX: SPLENECTOMY, TOTAL: SHX788

## 2012-02-16 HISTORY — PX: ADRENALECTOMY: SHX876

## 2012-02-21 DIAGNOSIS — J189 Pneumonia, unspecified organism: Secondary | ICD-10-CM | POA: Insufficient documentation

## 2012-02-21 DIAGNOSIS — IMO0002 Reserved for concepts with insufficient information to code with codable children: Secondary | ICD-10-CM | POA: Insufficient documentation

## 2012-03-10 ENCOUNTER — Other Ambulatory Visit: Payer: Self-pay | Admitting: Family Medicine

## 2012-03-24 ENCOUNTER — Other Ambulatory Visit: Payer: Self-pay | Admitting: Family Medicine

## 2012-04-04 LAB — HEMOGLOBIN A1C
Hgb A1c MFr Bld: 6.3 % — ABNORMAL HIGH (ref <5.7)
Mean Plasma Glucose: 134 mg/dL — ABNORMAL HIGH (ref <117)

## 2012-04-04 LAB — VITAMIN D 25 HYDROXY (VIT D DEFICIENCY, FRACTURES): Vit D, 25-Hydroxy: 42 ng/mL (ref 30–89)

## 2012-04-11 ENCOUNTER — Encounter: Payer: BC Managed Care – PPO | Admitting: Family Medicine

## 2012-04-13 ENCOUNTER — Encounter: Payer: Self-pay | Admitting: Family Medicine

## 2012-04-13 ENCOUNTER — Ambulatory Visit (INDEPENDENT_AMBULATORY_CARE_PROVIDER_SITE_OTHER): Payer: BC Managed Care – PPO | Admitting: Family Medicine

## 2012-04-13 VITALS — BP 110/70 | HR 92 | Resp 18 | Ht 66.0 in | Wt 200.1 lb

## 2012-04-13 DIAGNOSIS — Z0000 Encounter for general adult medical examination without abnormal findings: Secondary | ICD-10-CM

## 2012-04-13 DIAGNOSIS — G4730 Sleep apnea, unspecified: Secondary | ICD-10-CM

## 2012-04-13 DIAGNOSIS — E039 Hypothyroidism, unspecified: Secondary | ICD-10-CM

## 2012-04-13 DIAGNOSIS — R7303 Prediabetes: Secondary | ICD-10-CM

## 2012-04-13 DIAGNOSIS — Z1211 Encounter for screening for malignant neoplasm of colon: Secondary | ICD-10-CM

## 2012-04-13 DIAGNOSIS — E119 Type 2 diabetes mellitus without complications: Secondary | ICD-10-CM

## 2012-04-13 DIAGNOSIS — E785 Hyperlipidemia, unspecified: Secondary | ICD-10-CM

## 2012-04-13 DIAGNOSIS — R7309 Other abnormal glucose: Secondary | ICD-10-CM

## 2012-04-13 LAB — POC HEMOCCULT BLD/STL (OFFICE/1-CARD/DIAGNOSTIC): Fecal Occult Blood, POC: NEGATIVE

## 2012-04-13 NOTE — Patient Instructions (Addendum)
Pf/u in 3 month  You are referred to Dr Gerilyn Pilgrim for sleep apnea management   Fasting lipid, cmp and EgFR, HBA1C, TSH and cBc in October before f/u   Congrats on smoking cessation  It is important that you exercise regularly at least 60 minutes 5 times a week. If you develop chest pain, have severe difficulty breathing, or feel very tired, stop exercising immediately and seek medical attention   Weight loss goal of 3 to 4 pounds per month

## 2012-04-16 ENCOUNTER — Encounter: Payer: Self-pay | Admitting: Family Medicine

## 2012-04-16 DIAGNOSIS — E1169 Type 2 diabetes mellitus with other specified complication: Secondary | ICD-10-CM | POA: Insufficient documentation

## 2012-04-16 DIAGNOSIS — Z0000 Encounter for general adult medical examination without abnormal findings: Secondary | ICD-10-CM | POA: Insufficient documentation

## 2012-04-16 NOTE — Progress Notes (Signed)
  Subjective:    Patient ID: Annette [name], female    DOB: November 18, 1960, 51 y.o.   MRN: [id number]  HPI The PT is here for annual exam  and re-evaluation of chronic medical conditions, medication management and review of any available recent lab and radiology data.  Preventive health is updated, specifically  Cancer screening and Immunization.  Pt had a hysterectomy for benign reasons, due to fibroids. 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  The pt requests new CPAP equipment, needs her sleep apnea re evaluated also as has not seen a sleep specialist for over 3 years at least She quit smoking at the time of her recent surgery, and has also lost weigh probably due to the fact that she has been hospitalized sick    Review of Systems See HPI Denies recent fever or chills.Feels tired  And weak, slow recovery from complicated surery Denies sinus pressure, nasal congestion, ear pain or sore throat. Denies chest congestion, productive cough or wheezing. Denies chest pains, palpitations and leg swelling Denies abdominal pain, nausea, vomiting,diarrhea or constipation.   Denies dysuria, frequency, hesitancy or incontinence. Denies joint pain, swelling and limitation in mobility. Denies headaches, seizures, numbness, or tingling. Denies depression, anxiety or insomnia. Denies skin break down or rash.        Objective:   Physical Exam Pleasant well nourished female, alert and oriented x 3, in no cardio-pulmonary distress. Afebrile. HEENT No facial trauma or asymetry. Sinuses non tender.  EOMI, PERTL, fundoscopic exam is normal, no hemorhage or exudate.  External ears normal, tympanic membranes clear. Oropharynx moist, no exudate, fair dentition. Neck: supple, no adenopathy,JVD or thyromegaly.No bruits.  Chest: Clear to  ascultation bilaterally.No crackles or wheezes. Non tender to palpation  Breast: No asymetry,no masses. No nipple discharge or inversion. No axillary or supraclavicular adenopathy.s/p breast reduction surgery, scar intactr  Cardiovascular system; Heart sounds normal,  S1 and  S2 ,no S3.  No murmur, or thrill. Apical beat not displaced Peripheral pulses normal.  Abdomen: Soft, mild superficial  tenderness, no organomegaly or masses. No bruits. Bowel sounds normal. No guarding,  or rebound.  Rectal:  No mass. Guaiac negative stool.  GU: External genitalia normal. No lesions. Vaginal canal normal.No discharge. Uterus absent, no adnexal masses, no adnexal tenderness.  Musculoskeletal exam: Full ROM of spine, hips , shoulders and knees. No deformity ,swelling or crepitus noted. No muscle wasting or atrophy.   Neurologic: Cranial nerves 2 to 12 intact. Power, tone ,sensation and reflexes normal throughout. No disturbance in gait. No tremor.  Skin: Intact, no ulceration, erythema , scaling or rash noted. Pigmentation normal throughout. Recent surgical scar well healed  Psych; Normal mood and affect. Judgement and concentration normal. Pt slightly anxious as she discusses recent surgery        Assessment & Plan:

## 2012-04-16 NOTE — Assessment & Plan Note (Signed)
The importance of healthy lifestyle to include low fat  Diet, rich in vegetable and fruit, fresh or frozen  A commitment to regular  Is discussed.  Pt applauded on smoking cessation and encouraged to continue same.  Weight loss goals set for the next several monht ,

## 2012-04-16 NOTE — Assessment & Plan Note (Signed)
Needs re eval by sleep specialist so that pressure settings can be re adjusted if needed

## 2012-04-24 ENCOUNTER — Telehealth: Payer: Self-pay | Admitting: Family Medicine

## 2012-04-24 NOTE — Telephone Encounter (Signed)
Called pharmacy to get them to send a refill request since we've never filled before. Will send back in once received

## 2012-04-28 ENCOUNTER — Other Ambulatory Visit: Payer: Self-pay

## 2012-04-28 MED ORDER — METOPROLOL TARTRATE 25 MG PO TABS: ORAL_TABLET | ORAL | Status: DC

## 2012-05-29 ENCOUNTER — Encounter (HOSPITAL_COMMUNITY): Payer: Self-pay | Admitting: Cardiology

## 2012-06-13 ENCOUNTER — Other Ambulatory Visit (HOSPITAL_COMMUNITY): Payer: Self-pay | Admitting: Oncology

## 2012-06-13 DIAGNOSIS — C50919 Malignant neoplasm of unspecified site of unspecified female breast: Secondary | ICD-10-CM

## 2012-06-13 MED ORDER — LORAZEPAM 1 MG PO TABS: 1.0000 mg | ORAL_TABLET | ORAL | Status: DC | PRN

## 2012-06-22 ENCOUNTER — Other Ambulatory Visit: Payer: Self-pay | Admitting: Family Medicine

## 2012-07-17 ENCOUNTER — Telehealth (INDEPENDENT_AMBULATORY_CARE_PROVIDER_SITE_OTHER): Payer: Self-pay | Admitting: *Deleted

## 2012-07-17 NOTE — Telephone Encounter (Signed)
[facility] has requested a refill on Diphenoxylate-Atropine Tab. Take one tablet by mouth two times a day, must last 30 days.

## 2012-07-24 ENCOUNTER — Ambulatory Visit: Payer: BC Managed Care – PPO | Admitting: Family Medicine

## 2012-08-03 LAB — COMPLETE METABOLIC PANEL WITH GFR
ALT: 14 U/L (ref 0–35)
AST: 14 U/L (ref 0–37)
Albumin: 4 g/dL (ref 3.5–5.2)
Alkaline Phosphatase: 86 U/L (ref 39–117)
BUN: 17 mg/dL (ref 6–23)
CO2: 32 mEq/L (ref 19–32)
Calcium: 10 mg/dL (ref 8.4–10.5)
Chloride: 105 mEq/L (ref 96–112)
Creat: 0.82 mg/dL (ref 0.50–1.10)
GFR, Est African American: >89 mL/min
GFR, Est Non African American: 83 mL/min
Glucose, Bld: 107 mg/dL — ABNORMAL HIGH (ref 70–99)
Potassium: 4.7 mEq/L (ref 3.5–5.3)
Sodium: 141 mEq/L (ref 135–145)
Total Bilirubin: 0.3 mg/dL (ref 0.3–1.2)
Total Protein: 6.9 g/dL (ref 6.0–8.3)

## 2012-08-03 LAB — CBC
HCT: 42.8 % (ref 36.0–46.0)
Hemoglobin: 13.9 g/dL (ref 12.0–15.0)
MCH: 30.6 pg (ref 26.0–34.0)
MCHC: 32.5 g/dL (ref 30.0–36.0)
MCV: 94.3 fL (ref 78.0–100.0)
Platelets: 442 10*3/uL — ABNORMAL HIGH (ref 150–400)
RBC: 4.54 MIL/uL (ref 3.87–5.11)
RDW: 14.3 % (ref 11.5–15.5)
WBC: 9.5 10*3/uL (ref 4.0–10.5)

## 2012-08-03 LAB — LIPID PANEL
Cholesterol: 213 mg/dL — ABNORMAL HIGH (ref 0–200)
HDL: 47 mg/dL (ref >39)
LDL Cholesterol: 144 mg/dL — ABNORMAL HIGH (ref 0–99)
Total CHOL/HDL Ratio: 4.5 Ratio
Triglycerides: 110 mg/dL (ref <150)
VLDL: 22 mg/dL (ref 0–40)

## 2012-08-03 LAB — HEMOGLOBIN A1C
Hgb A1c MFr Bld: 6.1 % — ABNORMAL HIGH (ref <5.7)
Mean Plasma Glucose: 128 mg/dL — ABNORMAL HIGH (ref <117)

## 2012-08-03 LAB — TSH: TSH: 2.663 u[IU]/mL (ref 0.350–4.500)

## 2012-08-14 ENCOUNTER — Ambulatory Visit (INDEPENDENT_AMBULATORY_CARE_PROVIDER_SITE_OTHER): Payer: BC Managed Care – PPO | Admitting: Family Medicine

## 2012-08-14 ENCOUNTER — Encounter: Payer: Self-pay | Admitting: Family Medicine

## 2012-08-14 VITALS — BP 116/58 | HR 75 | Resp 18 | Ht 66.0 in | Wt 209.0 lb

## 2012-08-14 DIAGNOSIS — Z23 Encounter for immunization: Secondary | ICD-10-CM

## 2012-08-14 DIAGNOSIS — K1370 Unspecified lesions of oral mucosa: Secondary | ICD-10-CM

## 2012-08-14 DIAGNOSIS — E785 Hyperlipidemia, unspecified: Secondary | ICD-10-CM

## 2012-08-14 DIAGNOSIS — E119 Type 2 diabetes mellitus without complications: Secondary | ICD-10-CM

## 2012-08-14 DIAGNOSIS — R7303 Prediabetes: Secondary | ICD-10-CM

## 2012-08-14 DIAGNOSIS — E669 Obesity, unspecified: Secondary | ICD-10-CM

## 2012-08-14 DIAGNOSIS — G4700 Insomnia, unspecified: Secondary | ICD-10-CM

## 2012-08-14 DIAGNOSIS — R7309 Other abnormal glucose: Secondary | ICD-10-CM

## 2012-08-14 DIAGNOSIS — K1379 Other lesions of oral mucosa: Secondary | ICD-10-CM

## 2012-08-14 MED ORDER — TRIAMTERENE-HCTZ 37.5-25 MG PO CAPS: 1.0000 | ORAL_CAPSULE | Freq: Every day | ORAL | Status: DC

## 2012-08-14 MED ORDER — METOPROLOL TARTRATE 25 MG PO TABS: ORAL_TABLET | ORAL | Status: DC

## 2012-08-14 MED ORDER — FLUCONAZOLE 150 MG PO TABS: ORAL_TABLET | ORAL | Status: DC

## 2012-08-14 MED ORDER — CLINDAMYCIN HCL 300 MG PO CAPS: 300.0000 mg | ORAL_CAPSULE | Freq: Three times a day (TID) | ORAL | Status: DC

## 2012-08-14 NOTE — Patient Instructions (Addendum)
F/u in 4.5 month, call if you need me before   It is important that you exercise regularly at least 30 minutes 5 times a week. If you develop chest pain, have severe difficulty breathing, or feel very tired, stop exercising immediately and seek medical attention   Weight loss goal of 10 pounds.  Cholesterol is elevated, diet rich in fruit and vegetables is healthiest  Flu vaccine today  Med sent in for dental pain , please also notify you dentist.  You will get the info to look at your labs  On line please acces this  HBa1C , fasting chem 7 and lipids in 4.5 month

## 2012-08-14 NOTE — Progress Notes (Signed)
  Subjective:    Patient ID: Annette [name], female    DOB: 09-15-1961, 51 y.o.   MRN: [id number]  HPI The PT is here for follow up and re-evaluation of chronic medical conditions, medication management and review of any available recent lab and radiology data.  Preventive health is updated, specifically  Cancer screening and Immunization.   Questions or concerns regarding consultations or procedures which the PT has had in the interim are  addressed. The PT denies any adverse reactions to current medications since the last visit.  C/o dental pain following recent extraction with mild facial swelling, no fever or chill. Has changed diet with some weight loss, more enow that she is working full time      Review of Systems See HPI Denies recent fever or chills. Denies sinus pressure, nasal congestion, ear pain or sore throat. Denies chest congestion, productive cough or wheezing. Denies chest pains, palpitations and leg swelling Denies abdominal pain, nausea, vomiting,diarrhea or constipation.   Denies dysuria, frequency, hesitancy or incontinence. Denies joint pain, swelling and limitation in mobility. Denies headaches, seizures, numbness, or tingling. Denies depression, anxiety or insomnia. Denies skin break down or rash.        Objective:   Physical Exam Patient alert and oriented and in no cardiopulmonary distress.  HEENT: No facial asymmetry, EOMI, no sinus tenderness,  oropharynx pink and moist.  Neck supple no adenopathy.erythema and tenderness and swelling in area of recent dental work  Chest: Clear to auscultation bilaterally.  CVS: S1, S2 no murmurs, no S3.  ABD: Soft non tender. Bowel sounds normal.  Ext: No edema  MS: Adequate ROM spine, shoulders, hips and knees.  Skin: Intact, no ulcerations or rash noted.  Psych: Good eye contact, normal affect. Memory intact not anxious or depressed appearing.  CNS: CN 2-12 intact, power, tone and sensation normal  throughout.        Assessment & Plan:

## 2012-08-17 ENCOUNTER — Telehealth: Payer: Self-pay | Admitting: Family Medicine

## 2012-08-18 ENCOUNTER — Encounter: Payer: Self-pay | Admitting: Family Medicine

## 2012-08-18 ENCOUNTER — Other Ambulatory Visit: Payer: Self-pay

## 2012-08-18 ENCOUNTER — Other Ambulatory Visit: Payer: Self-pay | Admitting: Family Medicine

## 2012-08-18 MED ORDER — OSELTAMIVIR PHOSPHATE 75 MG PO CAPS: 75.0000 mg | ORAL_CAPSULE | Freq: Two times a day (BID) | ORAL | Status: DC

## 2012-08-18 NOTE — Telephone Encounter (Signed)
Advise tylenol and fluids, I am sending in tamiflu which will reduce symptom severity and duration, pls fax after you spk with her. Please document exact symptoms in phone message after you establish what exactly she means by "sick as a dog"

## 2012-08-18 NOTE — Telephone Encounter (Signed)
Patient states that symptoms started with sore throat.  Now has body aches, congestion, fatigue and cough.  Aware of med sent in and advice.

## 2012-08-25 NOTE — Assessment & Plan Note (Signed)
Recent extraction with inflammation and pain in the area, will prescribe antibiotics and pt also to contact her dentist

## 2012-08-25 NOTE — Assessment & Plan Note (Signed)
Increased lipids, had been normal in the past. Low fat diet discussed and encouraged, no med change at this time

## 2012-08-25 NOTE — Assessment & Plan Note (Signed)
Unchanged HBA1C remains at 6.1 Pt  Encouraged to work consistently with exercise and weight loss to improve this

## 2012-08-25 NOTE — Assessment & Plan Note (Signed)
Improved. Pt applauded on succesful weight loss through lifestyle change, and encouraged to continue same. Weight loss goal set for the next several months.  

## 2012-08-28 ENCOUNTER — Telehealth: Payer: Self-pay | Admitting: Family Medicine

## 2012-08-28 MED ORDER — METOPROLOL TARTRATE 25 MG PO TABS: ORAL_TABLET | ORAL | Status: DC

## 2012-08-28 NOTE — Telephone Encounter (Signed)
I don't see where this pt has ever gotten a 90 day supply. I will resend in for 90 day

## 2012-09-04 ENCOUNTER — Telehealth: Payer: Self-pay | Admitting: Family Medicine

## 2012-09-04 NOTE — Telephone Encounter (Signed)
Says she has been taking 1 1/2 tabs BID instead of QD and has been since she had surgery. Ok to send in for increased appt?

## 2012-09-04 NOTE — Telephone Encounter (Signed)
According to the medlist she takes it 1 1/2 tabs daily and 135 tabs is 90 day supply. Called pt no answer

## 2012-09-04 NOTE — Telephone Encounter (Signed)
At last visit blood pressure and pulse were both good , so please make the adjustment to the dose currently taking and send in script for 90 days with 1 refill and let her know. Please correct on med list also  If I understand correctly she is taking twice  The dose we have on file, pls let me know if further concerns

## 2012-09-05 MED ORDER — METOPROLOL TARTRATE 25 MG PO TABS: ORAL_TABLET | ORAL | Status: DC

## 2012-09-05 NOTE — Telephone Encounter (Signed)
Med increased and sent in

## 2012-09-05 NOTE — Telephone Encounter (Signed)
meds sent

## 2012-09-14 ENCOUNTER — Telehealth: Payer: Self-pay | Admitting: Family Medicine

## 2012-09-14 MED ORDER — METOPROLOL TARTRATE 25 MG PO TABS: ORAL_TABLET | ORAL | Status: DC

## 2012-09-14 MED ORDER — OMEPRAZOLE 20 MG PO CPDR: DELAYED_RELEASE_CAPSULE | ORAL | Status: DC

## 2012-09-14 NOTE — Telephone Encounter (Signed)
This has been resent again

## 2012-09-25 ENCOUNTER — Encounter (INDEPENDENT_AMBULATORY_CARE_PROVIDER_SITE_OTHER): Payer: Self-pay | Admitting: Internal Medicine

## 2012-09-25 ENCOUNTER — Ambulatory Visit (INDEPENDENT_AMBULATORY_CARE_PROVIDER_SITE_OTHER): Payer: BC Managed Care – PPO | Admitting: Internal Medicine

## 2012-09-25 VITALS — BP 116/70 | HR 76 | Temp 97.6°F | Resp 16 | Ht 66.0 in | Wt 209.4 lb

## 2012-09-25 DIAGNOSIS — K219 Gastro-esophageal reflux disease without esophagitis: Secondary | ICD-10-CM

## 2012-09-25 DIAGNOSIS — K589 Irritable bowel syndrome without diarrhea: Secondary | ICD-10-CM

## 2012-09-25 MED ORDER — DIPHENOXYLATE-ATROPINE 2.5-0.025 MG PO TABS: 1.0000 | ORAL_TABLET | Freq: Two times a day (BID) | ORAL | Status: DC | PRN

## 2012-09-25 NOTE — Patient Instructions (Signed)
Discontinue dicyclomine. Can take Lomotil or diphenoxylate 1 tablet up to 2 times a day as needed.

## 2012-09-25 NOTE — Progress Notes (Signed)
Presenting complaint;  Followup for GERD and diarrhea(IBS).  Subjective:  Patient is 51 year old Caucasian female who is here for yearly visit to review her GI condition than medications. She states as long as she watches her diet and takes omeprazole twice daily she does not experience heartburn very often. However if she misses one dose she has breakthrough symptoms. She is not having any side effects with PPI. She has occasional diarrhea. She has found that if she takes Lomotil and takes 1 tablet of One-A-Day vitamin she has normal stools. She is also on dicyclomine 10 mg daily. She has changed her eating habits and she also exercises regularly however she has not lost any weight. 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 She had this surgery late last year at [facility]. She had colonoscopy in October 2010. Family history is negative for CRC.  Current Medications: Current Outpatient Prescriptions  Medication Sig Dispense Refill  . dicyclomine (BENTYL) 20 MG tablet TAKE ONE TABLET BY MOUTH TWO TIMES A DAY  60 tablet  5  . diphenoxylate-atropine (LOMOTIL) 2.5-0.025 MG per tablet TAKE ONE TABLET BY MOUTH TWO TIMES A DAY MUST LAST 30 DAYS  60 tablet  3  . LORazepam (ATIVAN) 1 MG tablet Take 1 tablet (1 mg total) by mouth as needed.  30 tablet  3  . metoprolol tartrate (LOPRESSOR) 25 MG tablet Take 1 1/2 tab twice daily  270 tablet  1  . naproxen sodium (ANAPROX DS) 550 MG tablet One tablet twice daily, as neded for headache  40 tablet  1  . omeprazole (PRILOSEC) 20 MG capsule TAKE ONE CAPSULE (20 MG TOTAL) BY MOUTH TWO TIMES A DAY. WILL BE TAKEN IN PLACE OF THE PROTONIX  180 capsule  1  . POTASSIUM CHLORIDE PO Take 595 mg by mouth daily. Per the patient this is [employer]      . promethazine (PHENERGAN) 25 MG tablet Take 1 tablet (25 mg total) by mouth 2 (two) times daily.   30 tablet  0  . traZODone (DESYREL) 100 MG tablet Take 1 tablet (100 mg total) by mouth at bedtime.  30 tablet  3  . triamterene-hydrochlorothiazide (DYAZIDE) 37.5-25 MG per capsule Take 1 each (1 capsule total) by mouth daily.  30 capsule  5    Objective: Blood pressure 116/70, pulse 76, temperature 97.6 F (36.4 C), temperature source Oral, resp. rate 16, height 5\' 6"  (1.676 m), weight 209 lb 6.4 oz (94.983 kg). Conjunctiva is pink. Sclera is nonicteric Oropharyngeal mucosa is normal. No neck masses or thyromegaly noted. Cardiac exam with regular rhythm normal S1 and S2. No murmur or gallop noted. Lungs are clear to auscultation. Abdomen. She has left subcostal scar. Bowel sounds are hyperactive. Abdomen is soft and nontender without organomegaly or masses.  No LE edema or clubbing noted.   Assessment:  #1. GERD. Symptoms are well controlled with double dose PPI. Symptoms are not well controlled with single dose PPI. She has prescription for the next 6 months. #2. IBS. She is taking both Lomotil and dicyclomine and I believe she can discontinue dicyclomine. Last colonoscopy was in October 2010 and the next one would be in October 2020.   Plan:  Continue omeprazole at 20 mg by mouth twice a day. Discontinue dicyclomine. Lomotil 1 tablet by mouth twice a day when necessary prescription given for 60 with 5 refills. Office visit  in one year.

## 2012-09-29 ENCOUNTER — Telehealth: Payer: Self-pay | Admitting: Family Medicine

## 2012-09-29 NOTE — Telephone Encounter (Signed)
Called patient and left message for them to return call at the office   

## 2012-09-29 NOTE — Telephone Encounter (Signed)
Patient states that she has tightness and pain in chest and back when she coughs.  Advised to go to ED if symptoms worsen.

## 2012-10-01 NOTE — Telephone Encounter (Signed)
pls document symptoms, fever, cills , sputum , duration of illness and what she has  taken

## 2012-12-12 ENCOUNTER — Other Ambulatory Visit (HOSPITAL_COMMUNITY): Payer: Self-pay | Admitting: Oncology

## 2012-12-12 DIAGNOSIS — Z853 Personal history of malignant neoplasm of breast: Secondary | ICD-10-CM

## 2012-12-29 ENCOUNTER — Other Ambulatory Visit: Payer: Self-pay | Admitting: Family Medicine

## 2012-12-30 LAB — BASIC METABOLIC PANEL
BUN: 15 mg/dL (ref 6–23)
CO2: 26 mEq/L (ref 19–32)
Calcium: 9.9 mg/dL (ref 8.4–10.5)
Chloride: 105 mEq/L (ref 96–112)
Creat: 0.92 mg/dL (ref 0.50–1.10)
Glucose, Bld: 105 mg/dL — ABNORMAL HIGH (ref 70–99)
Potassium: 4.4 mEq/L (ref 3.5–5.3)
Sodium: 141 mEq/L (ref 135–145)

## 2012-12-30 LAB — HEMOGLOBIN A1C
Hgb A1c MFr Bld: 6.5 % — ABNORMAL HIGH (ref <5.7)
Mean Plasma Glucose: 140 mg/dL — ABNORMAL HIGH (ref <117)

## 2012-12-30 LAB — LIPID PANEL
Cholesterol: 224 mg/dL — ABNORMAL HIGH (ref 0–200)
HDL: 44 mg/dL (ref >39)
LDL Cholesterol: 158 mg/dL — ABNORMAL HIGH (ref 0–99)
Total CHOL/HDL Ratio: 5.1 Ratio
Triglycerides: 109 mg/dL (ref <150)
VLDL: 22 mg/dL (ref 0–40)

## 2013-01-03 ENCOUNTER — Ambulatory Visit: Payer: BC Managed Care – PPO | Admitting: Family Medicine

## 2013-01-08 ENCOUNTER — Other Ambulatory Visit (HOSPITAL_COMMUNITY): Payer: Self-pay | Admitting: Oncology

## 2013-01-08 DIAGNOSIS — C50919 Malignant neoplasm of unspecified site of unspecified female breast: Secondary | ICD-10-CM

## 2013-01-08 MED ORDER — LORAZEPAM 1 MG PO TABS: 1.0000 mg | ORAL_TABLET | ORAL | Status: DC | PRN

## 2013-01-09 ENCOUNTER — Encounter: Payer: Self-pay | Admitting: Family Medicine

## 2013-01-09 ENCOUNTER — Ambulatory Visit (INDEPENDENT_AMBULATORY_CARE_PROVIDER_SITE_OTHER): Payer: BC Managed Care – PPO | Admitting: Family Medicine

## 2013-01-09 ENCOUNTER — Ambulatory Visit (HOSPITAL_COMMUNITY): Payer: BC Managed Care – PPO | Admitting: Oncology

## 2013-01-09 VITALS — BP 110/70 | HR 75 | Resp 16 | Wt 213.4 lb

## 2013-01-09 DIAGNOSIS — E119 Type 2 diabetes mellitus without complications: Secondary | ICD-10-CM

## 2013-01-09 DIAGNOSIS — E669 Obesity, unspecified: Secondary | ICD-10-CM

## 2013-01-09 DIAGNOSIS — F17200 Nicotine dependence, unspecified, uncomplicated: Secondary | ICD-10-CM | POA: Insufficient documentation

## 2013-01-09 DIAGNOSIS — I10 Essential (primary) hypertension: Secondary | ICD-10-CM | POA: Insufficient documentation

## 2013-01-09 DIAGNOSIS — H6120 Impacted cerumen, unspecified ear: Secondary | ICD-10-CM | POA: Insufficient documentation

## 2013-01-09 DIAGNOSIS — H6123 Impacted cerumen, bilateral: Secondary | ICD-10-CM

## 2013-01-09 DIAGNOSIS — K589 Irritable bowel syndrome without diarrhea: Secondary | ICD-10-CM

## 2013-01-09 DIAGNOSIS — E785 Hyperlipidemia, unspecified: Secondary | ICD-10-CM

## 2013-01-09 MED ORDER — METFORMIN HCL ER 500 MG PO TB24: 500.0000 mg | ORAL_TABLET | Freq: Every day | ORAL | Status: DC

## 2013-01-09 MED ORDER — PRAVASTATIN SODIUM 10 MG PO TABS: 10.0000 mg | ORAL_TABLET | Freq: Every evening | ORAL | Status: DC

## 2013-01-09 NOTE — Progress Notes (Signed)
Both ears flushed successfully and checked by Dr Lodema Hong. No complications

## 2013-01-09 NOTE — Progress Notes (Signed)
  Subjective:    Patient ID: Annette [name], female    DOB: [birth year], 52 y.o.   MRN: [id number]  HPI Pt in for f/u chronic [problems. She is overwhelmed and fatigued, states she is making no progress with weight loss despite healthy eating habits and active lifestyle. Also very discouraged and becomes tearful, has  discussed surgery for weight loss with her husband and is ready for this. Father died in his late 23's of an MI, she has diabetes, hyperlipidemia and hypertension, has again started smoking and smokes half pack daily therefore at very high risk for CAD. Feels depressed and tired all the time, feels as though if her weight was better, her depression would be better, not suicidal or homicidal C/o bilateral ear fullness, has unsuccesfully attempted ear irrigation at home   Review of Systems See HPI Denies recent fever or chills. Denies sinus pressure, nasal congestion, ear pain or sore throat. Denies chest congestion, productive cough or wheezing. Denies chest pains, palpitations and leg swelling Denies abdominal pain, nausea, vomiting,diarrhea or constipation.   Denies dysuria, frequency, hesitancy or incontinence. Denies joint pain, swelling and limitation in mobility. Denies headaches, seizures, numbness, or tingling.  Denies skin break down or rash.        Objective:   Physical Exam  Patient alert and oriented and in no cardiopulmonary distress.  HEENT: No facial asymmetry, EOMI, no sinus tenderness,  oropharynx pink and moist.  Neck supple no adenopathy.Bilateral cerumen impaction  Chest: Clear to auscultation bilaterally.  CVS: S1, S2 no murmurs, no S3.  ABD: Soft non tender. Bowel sounds normal.  Ext: No edema  MS: Adequate ROM spine, shoulders, hips and knees.  Skin: Intact, no ulcerations or rash noted.  Psych: Good eye contact, flat affect. Memory intact not anxious , but tearful at times and  depressed appearing.  CNS: CN 2-12 intact, power, tone  and sensation normal throughout.       Assessment & Plan:

## 2013-01-09 NOTE — Assessment & Plan Note (Signed)
Patient counseled for approximately 5 minutes regarding the health risks of ongoing nicotine use, specifically all types of cancer, heart disease, stroke and respiratory failure. The options available for help with cessation ,the behavioral changes to assist the process, and the option to either gradully reduce usage  Or abruptly stop.is also discussed. Pt is also encouraged to set specific goals in number of cigarettes used daily, as well as to set a quit date. Understands the need to quit, related use to job stress, understands the fact that stopping smoking before her surgery will lower complication risk, will taper off over the next 5 weeks hopefully

## 2013-01-09 NOTE — Assessment & Plan Note (Signed)
Currently stable meds as before

## 2013-01-09 NOTE — Assessment & Plan Note (Signed)
Controlled, no change in medication DASH diet and commitment to daily physical activity for a minimum of 30 minutes discussed and encouraged, as a part of hypertension management. The importance of attaining a healthy weight is also discussed.  

## 2013-01-09 NOTE — Patient Instructions (Addendum)
F/u in 4 month  New medication for blood sugar and cholesterol.One daily of each, metformin and pravachol  You will get info on statins as a group the benefit, also you are referred for bariatric surgery evaluation and we will attempt to print basic info on surgical options  I do believe you are candidate for weight loss surgery and will be referred  Both ears flushed today

## 2013-01-09 NOTE — Assessment & Plan Note (Signed)
Deteriorated, resume once daily metformin. Re educated re portion sizes with literature. Daily exercise stressed  REPT HBA1C in 4 month  Refer for surhery

## 2013-01-09 NOTE — Assessment & Plan Note (Signed)
Deteriorated. Patient re-educated about  the importance of commitment to a  minimum of 150 minutes of exercise per week. The importance of healthy food choices with portion control discussed. Encouraged to start a food diary, count calories and to consider  joining a support group. Sample diet sheets offered. Goals set by the patient for the next several months.   Refer for bariatric surgery. Pt wishes this, multiple co morbidities , father died of CAD in his 63's

## 2013-01-09 NOTE — Assessment & Plan Note (Signed)
Deteriorated.  Hyperlipidemia:Low fat diet discussed and encouraged.   Start low dose statin

## 2013-01-09 NOTE — Assessment & Plan Note (Signed)
Bilateral succesful ear irirrigation , with no trauma to tM or external ear canal

## 2013-01-19 ENCOUNTER — Ambulatory Visit (HOSPITAL_COMMUNITY): Payer: BC Managed Care – PPO | Admitting: Oncology

## 2013-01-22 ENCOUNTER — Ambulatory Visit (HOSPITAL_COMMUNITY): Payer: Self-pay | Admitting: Oncology

## 2013-01-24 ENCOUNTER — Ambulatory Visit (HOSPITAL_COMMUNITY)
Admission: RE | Admit: 2013-01-24 | Discharge: 2013-01-24 | Disposition: A | Discharge status: Home / Self Care | Payer: BC Managed Care – PPO | Source: Ambulatory Visit | Attending: Oncology | Admitting: Oncology

## 2013-01-24 DIAGNOSIS — Z853 Personal history of malignant neoplasm of breast: Secondary | ICD-10-CM

## 2013-02-01 ENCOUNTER — Encounter (HOSPITAL_COMMUNITY): Payer: BC Managed Care – PPO | Attending: Oncology | Admitting: Oncology

## 2013-02-01 VITALS — BP 99/63 | HR 76 | Resp 16 | Wt 212.2 lb

## 2013-02-01 DIAGNOSIS — J449 Chronic obstructive pulmonary disease, unspecified: Secondary | ICD-10-CM

## 2013-02-01 DIAGNOSIS — E669 Obesity, unspecified: Secondary | ICD-10-CM

## 2013-02-01 DIAGNOSIS — F329 Major depressive disorder, single episode, unspecified: Secondary | ICD-10-CM

## 2013-02-01 DIAGNOSIS — C50419 Malignant neoplasm of upper-outer quadrant of unspecified female breast: Secondary | ICD-10-CM

## 2013-02-01 DIAGNOSIS — C50919 Malignant neoplasm of unspecified site of unspecified female breast: Secondary | ICD-10-CM

## 2013-02-01 DIAGNOSIS — B379 Candidiasis, unspecified: Secondary | ICD-10-CM

## 2013-02-01 MED ORDER — CLOTRIMAZOLE-BETAMETHASONE 1-0.05 % EX CREA: TOPICAL_CREAM | Freq: Two times a day (BID) | CUTANEOUS | Status: DC

## 2013-02-01 NOTE — Progress Notes (Signed)
Annette Overman, MD [address], [address] [place] [address]  Malignant neoplasm of breast (female), unspecified site  Candida infection - Plan: clotrimazole-betamethasone (LOTRISONE) cream  CURRENT THERAPY: Surveillance   INTERVAL HISTORY: Annette [name] 52 y.o. 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 No LVI was seen. Ki-67 marker was 37%, and I treated with dose dense Taxotere and Cytoxan via chemotherapy for 6 cycles and Herceptin for 52 weeks. She then had radiation therapy after the completion of chemotherapy. She finished tamoxifen after 5 years of therapy in November 2012. She presented here for the first time February 2007.   I personally reviewed and went over laboratory results with the patient.  Her labs are unremarkable with a  CBC that is WNL except for a mild thrombocytosis on 08/02/12 at 442,000.  Her metabolic panel is also unremarkable, but her Hgb A1C is noted to be 6.5.    I personally reviewed and went over radiographic studies with the patient.  Mammogram performed last week is BIRADS 2 and will require continued yearly surveillance.   She reports a left breast rash that has been ongoing for a few weeks.  She reports that it is pruritic.    She underwent adrenalectomy in May 2013 and intraoperatively, the spleen was injured and therefore she had a splenectomy simultaneously with adrenalectomy on left.  She reports that pathology from that procedure was negative for malignancy.   Otherwise, oncologically, she denies any complaints and ROS questioning is negative.   Past Medical History  Diagnosis Date  . Sleep apnea 2010    Problems with CPAP  . GERD (gastroesophageal reflux disease)   . Menieres disease     Controlled with triamterene   . Pituitary  insufficiency   . IBS (irritable bowel syndrome)   . Depression   . Obesity   . Migraines   . Palpitations   . Bruises easily   . Breast cancer 2007    History of  XRT, onTamoxifen  . Diabetes mellitus     prediabetic  . Hypothyroidism     following chemo amnd radiaition for breast cancer, needed replacement short term  . Hypothyroidism (acquired)     replaced x 1 year    has HYPOTHYROIDISM, BORDERLINE; VITAMIN D DEFICIENCY; HYPERLIPIDEMIA; OBESITY; IBS; SLEEP APNEA; ABNORMAL THYROID FUNCTION TESTS; BREAST CANCER, HX OF; THYROID MASS; Malignant neoplasm of breast (female), unspecified site; Abdominal or pelvic swelling, mass or lump, unspecified site; Meniere's disease; Sleep apnea; Nausea; Adrenal adenoma; Chest pain, atypical; Type 2 diabetes mellitus; Routine general medical examination at a health care facility; Insomnia; Oral pain; Cerumen impaction; HTN, goal below 130/80; and Nicotine dependence on her problem list.     is allergic to penicillins.  Annette [name] does not currently have medications on file.  Past Surgical History  Procedure Laterality Date  . Thyroidectomy, partial  11/05/2010    Benign disease  . Appendectomy  06/12/08  . Cholecystectomy  1985  . Breast surgery  2007    Left lumpectomy  . Breast reconstruction      Left reconstructive  . Breast surgery      Mammosite - right side  . Abdominal hysterectomy  1998    Benign, fibroids  . Adrenalectomy  02/16/2012    [facility], splemic trauma, resulting  in splenectomy  . 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   PHYSICAL EXAMINATION  ECOG PERFORMANCE STATUS: 1 - Symptomatic but completely  ambulatory  Filed Vitals:   02/01/13 1007  BP: 99/63  Pulse: 76  Resp: 16    GENERAL:alert, no distress, well nourished, well developed, comfortable, cooperative, obese and smiling SKIN: skin color, texture, turgor are normal, no rashes or significant lesions HEAD: Normocephalic, No masses, lesions, tenderness or abnormalities EYES: normal, EOMI, Conjunctiva are pink and non-injected EARS: External ears normal OROPHARYNX:mucous membranes are moist  NECK: supple, no adenopathy, thyroid normal size, non-tender, without nodularity, no stridor, non-tender, trachea midline LYMPH:  no palpable lymphadenopathy, no hepatosplenomegaly BREAST:right breast normal without mass, skin or nipple changes or axillary nodes, left breast normal without mass, skin or nipple changes or axillary nodes LUNGS: clear to auscultation and percussion HEART: regular rate & rhythm, no murmurs, no gallops, S1 normal and S2 normal ABDOMEN:abdomen soft, non-tender, obese, normal bowel sounds, no masses or organomegaly, multiple surgical incision scars well healed and no hepatosplenomegaly BACK: Back symmetric, no curvature. EXTREMITIES:less then 2 second capillary refill, no joint deformities, effusion, or inflammation, no edema, no skin discoloration, no clubbing, no cyanosis  NEURO: alert & oriented x 3 with fluent speech, no focal motor/sensory deficits, gait normal   LABORATORY DATA: CBC    Component Value Date/Time   WBC 9.5 08/02/2012 1456   RBC 4.54 08/02/2012 1456   HGB 13.9 08/02/2012 1456   HCT 42.8 08/02/2012 1456   PLT 442* 08/02/2012 1456   MCV 94.3 08/02/2012 1456   MCH 30.6 08/02/2012 1456   MCHC 32.5 08/02/2012 1456   RDW 14.3 08/02/2012 1456   LYMPHSABS 3.1 07/24/2011 0045   MONOABS 0.7 07/24/2011 0045   EOSABS 0.1 07/24/2011 0045   BASOSABS 0.0 07/24/2011 0045      Chemistry      Component Value Date/Time   NA 141 12/29/2012 1230   K 4.4 12/29/2012 1230   CL 105 12/29/2012 1230   CO2  26 12/29/2012 1230   BUN 15 12/29/2012 1230   CREATININE 0.92 12/29/2012 1230   CREATININE 0.96 07/24/2011 0045      Component Value Date/Time   CALCIUM 9.9 12/29/2012 1230   ALKPHOS 86 08/02/2012 1456   AST 14 08/02/2012 1456   ALT 14 08/02/2012 1456   BILITOT 0.3 08/02/2012 1456        RADIOGRAPHIC STUDIES: 01/24/2013  *RADIOLOGY REPORT*  Clinical Data: The patient underwent left lumpectomy, chemotherapy  and radiation therapy for breast cancer in 2007. She had a  reconstruction of the left breast in June 2008 with reduction of  the right breast in March 2008.  DIGITAL DIAGNOSTIC BILATERAL MAMMOGRAM WITH CAD  Comparison: 01/19/2012, 01/12/2010, 01/07/2010, 01/02/2009  Findings:  ACR Breast Density Category 1: The breast tissue is almost entirely  fatty.  Left lumpectomy changes are present. There is no suspicious  dominant mass, nonsurgical architectural distortion or  calcification to suggest malignancy. There is coarse calcification  in the lumpectomy site consistent with benign fat necrosis.  Mammographic images were processed with CAD.  IMPRESSION:  No mammographic evidence of malignancy.  RECOMMENDATION:  Yearly screening mammography may now be instituted.  I have discussed the findings and  recommendations with the patient.  Results were also provided in writing at the conclusion of the  visit. If applicable, a reminder letter will be sent to the  patient regarding her next appointment.  BI-RADS CATEGORY 2: Benign finding(s).  Original Report Authenticated By: Cain Saupe, M.D.    ASSESSMENT:  1. 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 No LVI was seen. Ki-67 marker was 37%, and I treated with dose dense Taxotere and Cytoxan via chemotherapy for 6 cycles and Herceptin for 52 weeks. She then had radiation  therapy after the completion of chemotherapy. She finished tamoxifen after 5 years of therapy in November 2012. She presented here for the first time February 2007.  2. Pituitary insufficiency, seeing an endocrinologist in [facility].  3. Bilateral adrenal adenomas, S/P left adrenalectomy with operative injury to spleen requiring splenectomy in May 2013 in [place] by Dr. Cathie Beams.   4. Irritable bowel syndrome with EGD and colonoscopy by Dr. Karilyn Cota.  5. Migraine headaches.  6. Appendectomy in September 2009 by Dr. Justice Britain.  7. Depression, on Lamictal.  8. Obesity, weighing 212 still.  9. Chronic obstructive pulmonary disease secondary to longstanding smoking history, still smoking.   PLAN:  1. I personally reviewed and went over laboratory results with the patient. 2. Rx for Lotrisone cream to be applied to affected area. 3. Patient education regarding candidal infections 4. Next screening Mammogram will be due April 2015 5. I personally reviewed and went over radiographic studies with the patient. 6. Return in 1 year for follow-up   All questions were answered. The patient knows to call the clinic with any problems, questions or concerns. We can certainly see the patient much sooner if necessary.  Patient and plan will be discussed with Dr. Mariel Sleet within the next 24 hours.    Annette [name]

## 2013-02-01 NOTE — Patient Instructions (Addendum)
[facility] Discharge Instructions  RECOMMENDATIONS MADE BY THE CONSULTANT AND ANY TEST RESULTS WILL BE SENT TO YOUR REFERRING PHYSICIAN.  EXAM FINDINGS BY THE PHYSICIAN TODAY AND SIGNS OR SYMPTOMS TO REPORT TO CLINIC OR PRIMARY PHYSICIAN: Fungal infection to the left breast and possible sites on the abdomen.  These should clear up with the cream.    MEDICATIONS PRESCRIBED:  Cream to be picked up in pharmacy.    SPECIAL INSTRUCTIONS/FOLLOW-UP: Return to see [place] in 1 year.  Thank you for choosing [facility] to provide your oncology and hematology care.  To afford each patient quality time with our providers, please arrive at least 15 minutes before your scheduled appointment time.  With your help, our goal is to use those 15 minutes to complete the necessary work-up to ensure our physicians have the information they need to help with your evaluation and healthcare recommendations.    Effective January 1st, 2014, we ask that you re-schedule your appointment with our physicians should you arrive 10 or more minutes late for your appointment.  We strive to give you quality time with our providers, and arriving late affects you and other patients whose appointments are after yours.    Again, thank you for choosing [facility].  Our hope is that these requests will decrease the amount of time that you wait before being seen by our physicians.       _____________________________________________________________  Should you have questions after your visit to [facility], please contact our office at [phone] between the hours of 8:30 a.m. and 5:00 p.m.  Voicemails left after 4:30 p.m. will not be returned until the following business day.  For prescription refill requests, have your pharmacy contact our office with your prescription refill request.

## 2013-03-06 ENCOUNTER — Telehealth: Payer: Self-pay | Admitting: Family Medicine

## 2013-03-06 MED ORDER — OMEPRAZOLE 20 MG PO CPDR: DELAYED_RELEASE_CAPSULE | ORAL | Status: DC

## 2013-03-06 NOTE — Telephone Encounter (Signed)
Refill sent to pharmacy.   

## 2013-03-29 ENCOUNTER — Other Ambulatory Visit: Payer: Self-pay

## 2013-03-29 MED ORDER — METOPROLOL TARTRATE 25 MG PO TABS: ORAL_TABLET | ORAL | Status: DC

## 2013-04-05 ENCOUNTER — Other Ambulatory Visit: Payer: Self-pay

## 2013-04-05 DIAGNOSIS — G4700 Insomnia, unspecified: Secondary | ICD-10-CM

## 2013-04-05 MED ORDER — TRAZODONE HCL 100 MG PO TABS: 100.0000 mg | ORAL_TABLET | Freq: Every day | ORAL | Status: DC

## 2013-04-23 ENCOUNTER — Other Ambulatory Visit: Payer: Self-pay | Admitting: Family Medicine

## 2013-04-23 ENCOUNTER — Telehealth (INDEPENDENT_AMBULATORY_CARE_PROVIDER_SITE_OTHER): Payer: Self-pay | Admitting: *Deleted

## 2013-04-23 ENCOUNTER — Other Ambulatory Visit (INDEPENDENT_AMBULATORY_CARE_PROVIDER_SITE_OTHER): Payer: Self-pay | Admitting: Internal Medicine

## 2013-04-23 NOTE — Telephone Encounter (Signed)
Patient was called and message was letting her know that her prescription was ready to be picked up at the front desk.

## 2013-04-26 NOTE — Telephone Encounter (Signed)
Apt has been scheduled for 09/18/13 with Dr. Karilyn Cota.

## 2013-04-30 ENCOUNTER — Other Ambulatory Visit: Payer: Self-pay | Admitting: Family Medicine

## 2013-05-10 ENCOUNTER — Ambulatory Visit: Payer: BC Managed Care – PPO | Admitting: Family Medicine

## 2013-05-14 ENCOUNTER — Ambulatory Visit (INDEPENDENT_AMBULATORY_CARE_PROVIDER_SITE_OTHER): Payer: BC Managed Care – PPO | Admitting: Family Medicine

## 2013-05-14 ENCOUNTER — Encounter: Payer: Self-pay | Admitting: Family Medicine

## 2013-05-14 ENCOUNTER — Other Ambulatory Visit: Payer: Self-pay | Admitting: Family Medicine

## 2013-05-14 VITALS — BP 114/64 | HR 62 | Resp 18 | Ht 66.0 in | Wt 212.0 lb

## 2013-05-14 DIAGNOSIS — E039 Hypothyroidism, unspecified: Secondary | ICD-10-CM

## 2013-05-14 DIAGNOSIS — E669 Obesity, unspecified: Secondary | ICD-10-CM

## 2013-05-14 DIAGNOSIS — F411 Generalized anxiety disorder: Secondary | ICD-10-CM

## 2013-05-14 DIAGNOSIS — E8881 Metabolic syndrome: Secondary | ICD-10-CM

## 2013-05-14 DIAGNOSIS — E119 Type 2 diabetes mellitus without complications: Secondary | ICD-10-CM

## 2013-05-14 DIAGNOSIS — F17200 Nicotine dependence, unspecified, uncomplicated: Secondary | ICD-10-CM

## 2013-05-14 DIAGNOSIS — G4700 Insomnia, unspecified: Secondary | ICD-10-CM

## 2013-05-14 DIAGNOSIS — E785 Hyperlipidemia, unspecified: Secondary | ICD-10-CM

## 2013-05-14 DIAGNOSIS — G4730 Sleep apnea, unspecified: Secondary | ICD-10-CM

## 2013-05-14 DIAGNOSIS — R110 Nausea: Secondary | ICD-10-CM

## 2013-05-14 MED ORDER — VENLAFAXINE HCL 37.5 MG PO TABS: 37.5000 mg | ORAL_TABLET | Freq: Two times a day (BID) | ORAL | Status: DC

## 2013-05-14 MED ORDER — TEMAZEPAM 30 MG PO CAPS: 30.0000 mg | ORAL_CAPSULE | Freq: Every evening | ORAL | Status: DC | PRN

## 2013-05-14 NOTE — Patient Instructions (Addendum)
F/u in 2 month, call if you need me before  New for anxiety is effexor once daily, also for sleep is restoril  Send me a message in 1 week if not effective  Please  Fasting lipid cmp , HBA1C, CBc, TSH, B12 today  Commit to reducing cugarettes by 1 per week please.  Please get exercisie every day for 30 to 45 mins

## 2013-05-14 NOTE — Assessment & Plan Note (Signed)
Unchanged, encouraged to reduce gradually starting in the evenings ,Patient counseled for approximately 5 minutes regarding the health risks of ongoing nicotine use, specifically all types of cancer, heart disease, stroke and respiratory failure. The options available for help with cessation ,the behavioral changes to assist the process, and the option to either gradully reduce usage  Or abruptly stop.is also discussed. Pt is also encouraged to set specific goals in number of cigarettes used daily, as well as to set a quit date.

## 2013-05-14 NOTE — Assessment & Plan Note (Signed)
Was "afraid ' to take statin . Re educated that benefit outweighs risk, hopefully based on lab today, if no better she will re consider. Apart form a dx of metabolic synd x she is a smoker and her father did have a hearty attack, will check on age at the time, when f/u contact made

## 2013-05-14 NOTE — Assessment & Plan Note (Signed)
Since one value at 6.5 , changed to prediabetic.  Updated lab today.  Needs to commit to daily physical activity, has a pool she can use , since feet hurt at the end of the day due to plantar fascitis Encouraged to log into mobile app for calorie accounting

## 2013-05-14 NOTE — Assessment & Plan Note (Signed)
Uncontrolled anxiety, blames it 100% on her work environment. Affecting relationship with her spouse, always arguing , mood instability. Denies depression PHQ9 score is a 5, with 3 form poor sleep Add SNRI also restoril

## 2013-05-14 NOTE — Assessment & Plan Note (Signed)
Complicated by swing shifts , uncontrolled frustration with her job and anxiety. Will address with medication, good sleep hygiene discussed. Offered therapy to help to cope with her frustration which is job related, deferred at this time

## 2013-05-14 NOTE — Assessment & Plan Note (Signed)
Deteriorated. Patient re-educated about  the importance of commitment to a  minimum of 150 minutes of exercise per week. The importance of healthy food choices with portion control discussed. Encouraged to start a food diary, count calories and to consider  joining a support group. Sample diet sheets offered. Goals set by the patient for the next several months.    

## 2013-05-14 NOTE — Progress Notes (Signed)
  Subjective:    Patient ID: Annette [name], female    DOB: 03-02-1961, 52 y.o.   MRN: [id number]  HPI The PT is here for follow up and re-evaluation of chronic medical conditions, medication management and review of any available recent lab and radiology data.  Preventive health is updated, specifically  Cancer screening and Immunization.   . The PT denies any adverse reactions to current medications since the last visit.  C/o poor sleep, chronic fatigue, irritabilty, always anxious and stressed, all related to her job, and the fact that she feels she always has to be doing other people's jobs to get hers done and management is doing nothing about this. Sleep has always been poor. No response to sleep agents in the past, and the fact that she works swing shifts is not helpful Denies symptoms of depression and has a low PHQ9 score. No weight loss, but no exercise , feet hurt all the time due to plantar fasciitis, unable to keep appt with podiatrist , but will call back Does have a pool at home and I encourage her to get in daily, also has membership at [facility]" will go when her spouse does Did not take statin , was "afraid" states her father was told that a statin caused his hearty attack      Review of Systems See HPI Denies recent fever or chills. Denies sinus pressure, nasal congestion, ear pain or sore throat. Denies chest congestion, productive cough or wheezing. Denies chest pains, palpitations and leg swelling Denies abdominal pain, nausea, vomiting,diarrhea or constipation.   Denies dysuria, frequency, hesitancy or incontinence. Denies headaches, seizures, numbness, or tingling.  Denies skin break down or rash.        Objective:   Physical Exam  Patient alert and oriented and in no cardiopulmonary distress.  HEENT: No facial asymmetry, EOMI, no sinus tenderness,  oropharynx pink and moist.  Neck supple no adenopathy.  Chest: Clear to auscultation  bilaterally.Decraesed though adequate air entry  CVS: S1, S2 no murmurs, no S3.  ABD: Soft non tender. Bowel sounds normal.  Ext: No edema  MS: Adequate ROM spine, shoulders, hips and knees.  Skin: Intact, no ulcerations or rash noted.  Psych: Good eye contact, normal affect. Memory intact  anxious not  depressed appearing.  CNS: CN 2-12 intact, power, tone and sensation normal throughout.       Assessment & Plan:

## 2013-05-14 NOTE — Assessment & Plan Note (Signed)
Chronic uses ativan

## 2013-05-14 NOTE — Progress Notes (Signed)
  Subjective:    Patient ID: Annette [name], female    DOB: [birth year], 52 y.o.   MRN: [id number]  HPI Rescheduled to later appt time same day  Review of Systems     Objective:   Physical Exam        Assessment & Plan:

## 2013-05-15 ENCOUNTER — Other Ambulatory Visit: Payer: Self-pay

## 2013-05-15 DIAGNOSIS — F411 Generalized anxiety disorder: Secondary | ICD-10-CM

## 2013-05-15 LAB — COMPREHENSIVE METABOLIC PANEL
ALT: 17 U/L (ref 0–35)
AST: 16 U/L (ref 0–37)
Albumin: 4.2 g/dL (ref 3.5–5.2)
Alkaline Phosphatase: 87 U/L (ref 39–117)
BUN: 14 mg/dL (ref 6–23)
CO2: 31 mEq/L (ref 19–32)
Calcium: 10 mg/dL (ref 8.4–10.5)
Chloride: 103 mEq/L (ref 96–112)
Creat: 0.94 mg/dL (ref 0.50–1.10)
Glucose, Bld: 90 mg/dL (ref 70–99)
Potassium: 4.6 mEq/L (ref 3.5–5.3)
Sodium: 140 mEq/L (ref 135–145)
Total Bilirubin: 0.6 mg/dL (ref 0.3–1.2)
Total Protein: 7.3 g/dL (ref 6.0–8.3)

## 2013-05-15 LAB — LIPID PANEL
Cholesterol: 217 mg/dL — ABNORMAL HIGH (ref 0–200)
HDL: 46 mg/dL (ref >39)
LDL Cholesterol: 145 mg/dL — ABNORMAL HIGH (ref 0–99)
Total CHOL/HDL Ratio: 4.7 Ratio
Triglycerides: 128 mg/dL (ref <150)
VLDL: 26 mg/dL (ref 0–40)

## 2013-05-15 LAB — CBC
HCT: 45.7 % (ref 36.0–46.0)
Hemoglobin: 15.6 g/dL — ABNORMAL HIGH (ref 12.0–15.0)
MCH: 32.4 pg (ref 26.0–34.0)
MCHC: 34.1 g/dL (ref 30.0–36.0)
MCV: 94.8 fL (ref 78.0–100.0)
Platelets: 413 10*3/uL — ABNORMAL HIGH (ref 150–400)
RBC: 4.82 MIL/uL (ref 3.87–5.11)
RDW: 12.8 % (ref 11.5–15.5)
WBC: 10.2 10*3/uL (ref 4.0–10.5)

## 2013-05-15 LAB — VITAMIN B12: Vitamin B-12: 568 pg/mL (ref 211–911)

## 2013-05-15 LAB — HEMOGLOBIN A1C
Hgb A1c MFr Bld: 5.9 % — ABNORMAL HIGH (ref <5.7)
Mean Plasma Glucose: 123 mg/dL — ABNORMAL HIGH (ref <117)

## 2013-05-15 LAB — TSH: TSH: 1.392 u[IU]/mL (ref 0.350–4.500)

## 2013-05-15 MED ORDER — NAPROXEN SODIUM 550 MG PO TABS: ORAL_TABLET | ORAL | Status: DC

## 2013-05-15 MED ORDER — VENLAFAXINE HCL 37.5 MG PO TABS: 37.5000 mg | ORAL_TABLET | Freq: Two times a day (BID) | ORAL | Status: DC

## 2013-05-24 ENCOUNTER — Other Ambulatory Visit: Payer: Self-pay

## 2013-05-24 ENCOUNTER — Telehealth: Payer: Self-pay | Admitting: Family Medicine

## 2013-05-24 DIAGNOSIS — F411 Generalized anxiety disorder: Secondary | ICD-10-CM

## 2013-05-24 MED ORDER — VENLAFAXINE HCL 37.5 MG PO TABS: 37.5000 mg | ORAL_TABLET | Freq: Two times a day (BID) | ORAL | Status: DC

## 2013-05-24 NOTE — Telephone Encounter (Signed)
THIS WAS DONE ON THE 12TH OF AUG. PATIENT AWARE

## 2013-05-28 ENCOUNTER — Encounter: Payer: Self-pay | Admitting: Family Medicine

## 2013-06-21 ENCOUNTER — Other Ambulatory Visit: Payer: Self-pay | Admitting: Family Medicine

## 2013-07-05 ENCOUNTER — Other Ambulatory Visit: Payer: Self-pay

## 2013-07-05 DIAGNOSIS — G4700 Insomnia, unspecified: Secondary | ICD-10-CM

## 2013-07-05 MED ORDER — TRAZODONE HCL 100 MG PO TABS: 100.0000 mg | ORAL_TABLET | Freq: Every day | ORAL | Status: DC

## 2013-07-23 ENCOUNTER — Ambulatory Visit (INDEPENDENT_AMBULATORY_CARE_PROVIDER_SITE_OTHER): Payer: BC Managed Care – PPO | Admitting: Family Medicine

## 2013-07-23 ENCOUNTER — Encounter: Payer: Self-pay | Admitting: Family Medicine

## 2013-07-23 VITALS — BP 104/64 | HR 80 | Resp 16 | Ht 66.0 in | Wt 208.0 lb

## 2013-07-23 DIAGNOSIS — E785 Hyperlipidemia, unspecified: Secondary | ICD-10-CM

## 2013-07-23 DIAGNOSIS — E559 Vitamin D deficiency, unspecified: Secondary | ICD-10-CM

## 2013-07-23 DIAGNOSIS — R7303 Prediabetes: Secondary | ICD-10-CM

## 2013-07-23 DIAGNOSIS — R7309 Other abnormal glucose: Secondary | ICD-10-CM

## 2013-07-23 DIAGNOSIS — F17200 Nicotine dependence, unspecified, uncomplicated: Secondary | ICD-10-CM

## 2013-07-23 DIAGNOSIS — Z23 Encounter for immunization: Secondary | ICD-10-CM

## 2013-07-23 DIAGNOSIS — G4700 Insomnia, unspecified: Secondary | ICD-10-CM

## 2013-07-23 DIAGNOSIS — E8881 Metabolic syndrome: Secondary | ICD-10-CM

## 2013-07-23 DIAGNOSIS — G4730 Sleep apnea, unspecified: Secondary | ICD-10-CM

## 2013-07-23 DIAGNOSIS — I10 Essential (primary) hypertension: Secondary | ICD-10-CM

## 2013-07-23 DIAGNOSIS — F411 Generalized anxiety disorder: Secondary | ICD-10-CM

## 2013-07-23 DIAGNOSIS — E669 Obesity, unspecified: Secondary | ICD-10-CM

## 2013-07-23 MED ORDER — TRIAMTERENE-HCTZ 37.5-25 MG PO CAPS: ORAL_CAPSULE | ORAL | Status: DC

## 2013-07-23 MED ORDER — VENLAFAXINE HCL ER 75 MG PO CP24: 75.0000 mg | ORAL_CAPSULE | Freq: Every day | ORAL | Status: DC

## 2013-07-23 NOTE — Patient Instructions (Addendum)
Pelvic and breast in 4 month call if you need me before  Flu vaccine today  Continue restoril  Reduce cigaretteds by one each week, count, how many you keep with you, start with 9, then reduce to 8 etc, you will benefit from quitting  Fasting lipid, cmp and EGFr, HBA1C , vit D in 4 month, before visit  Triamterene will be refilled per your request  It is important that you exercise regularly at least 30 minutes 5 times a week. If you develop chest pain, have severe difficulty breathing, or feel very tired, stop exercising immediately and seek medical attention

## 2013-07-23 NOTE — Progress Notes (Signed)
  Subjective:    Patient ID: Annette [name], female    DOB: [birth year], 52 y.o.   MRN: [id number]  HPI The PT is here for follow up and re-evaluation of chronic medical conditions, medication management and review of any available recent lab and radiology data.  Preventive health is updated, specifically  Cancer screening and Immunization.   The PT denies any adverse reactions to current medications since the last visit. Reports good response to new medications for anxiety and sleep with no adverse s/e  There are no new concerns. Still smoking wants to try to quit but unwilling to set a date There are no specific complaints       Review of Systems See HPI Denies recent fever or chills. Denies sinus pressure, nasal congestion, ear pain or sore throat. Denies chest congestion, productive cough or wheezing. Denies chest pains, palpitations and leg swelling Denies abdominal pain, nausea, vomiting,diarrhea or constipation.   Denies dysuria, frequency, hesitancy or incontinence. Denies joint pain, swelling and limitation in mobility. Denies headaches, seizures, numbness, or tingling. Denies uncontrolled  depression, anxiety or insomnia. Denies skin break down or rash.        Objective:   Physical Exam  Patient alert and oriented and in no cardiopulmonary distress.  HEENT: No facial asymmetry, EOMI, no sinus tenderness,  oropharynx pink and moist.  Neck supple no adenopathy.  Chest: Clear to auscultation bilaterally.  CVS: S1, S2 no murmurs, no S3.  ABD: Soft non tender. Bowel sounds normal.  Ext: No edema  MS: Adequate ROM spine, shoulders, hips and knees.  Skin: Intact, no ulcerations or rash noted.  Psych: Good eye contact, normal affect. Memory intact not anxious or depressed appearing.  CNS: CN 2-12 intact, power, tone and sensation normal throughout.       Assessment & Plan:

## 2013-07-24 ENCOUNTER — Other Ambulatory Visit (HOSPITAL_COMMUNITY): Payer: Self-pay | Admitting: Oncology

## 2013-07-24 DIAGNOSIS — C50919 Malignant neoplasm of unspecified site of unspecified female breast: Secondary | ICD-10-CM

## 2013-07-24 MED ORDER — LORAZEPAM 1 MG PO TABS: 1.0000 mg | ORAL_TABLET | ORAL | Status: DC | PRN

## 2013-08-01 ENCOUNTER — Other Ambulatory Visit: Payer: Self-pay | Admitting: Family Medicine

## 2013-08-01 ENCOUNTER — Telehealth: Payer: Self-pay

## 2013-08-01 MED ORDER — PROMETHAZINE-DM 6.25-15 MG/5ML PO SYRP: ORAL_SOLUTION | ORAL | Status: AC

## 2013-08-01 NOTE — Telephone Encounter (Signed)
Patient aware.  States that she has chest pain from coughing and is asking for cough medicine.

## 2013-08-01 NOTE — Telephone Encounter (Signed)
Phenergan dm sent in pls let her know 

## 2013-08-01 NOTE — Telephone Encounter (Signed)
Recommend mucinex, or robitussin and sudafed, in absence of symptoms of infection, nothing else. Drink a lot of water andinc vit c intake (oranges)

## 2013-08-01 NOTE — Telephone Encounter (Signed)
Called and left message for patient notifying of new rx.

## 2013-08-01 NOTE — Telephone Encounter (Signed)
What would you advise otc?

## 2013-08-06 ENCOUNTER — Other Ambulatory Visit: Payer: Self-pay | Admitting: Family Medicine

## 2013-09-03 NOTE — Assessment & Plan Note (Signed)
Sleep hygiene reviewed. Improved sleep with restoril, continue same

## 2013-09-03 NOTE — Assessment & Plan Note (Signed)
Improved. Pt applauded on succesful weight loss through lifestyle change, and encouraged to continue same. Weight loss goal set for the next several months.  

## 2013-09-03 NOTE — Assessment & Plan Note (Signed)
Currently being followed by neurology, reports compliance with CPAP

## 2013-09-03 NOTE — Assessment & Plan Note (Signed)
Unchanged, willing to work on quitting.No quit date set Patient counseled for approximately 5 minutes regarding the health risks of ongoing nicotine use, specifically all types of cancer, heart disease, stroke and respiratory failure. The options available for help with cessation ,the behavioral changes to assist the process, and the option to either gradully reduce usage  Or abruptly stop.is also discussed. Pt is also encouraged to set specific goals in number of cigarettes used daily, as well as to set a quit date.

## 2013-09-03 NOTE — Assessment & Plan Note (Signed)
Controlled, no change in medication DASH diet and commitment to daily physical activity for a minimum of 30 minutes discussed and encouraged, as a part of hypertension management. The importance of attaining a healthy weight is also discussed.  

## 2013-09-03 NOTE — Assessment & Plan Note (Signed)
Improved symptoms , continue current medication

## 2013-09-03 NOTE — Assessment & Plan Note (Signed)
Improved blood sugar on metformin continue same  Patient educated about the importance of limiting  Carbohydrate intake , the need to commit to daily physical activity for a minimum of 30 minutes , and to commit weight loss. The fact that changes in all these areas will reduce or eliminate all together the development of diabetes is stressed.

## 2013-09-07 ENCOUNTER — Telehealth: Payer: Self-pay | Admitting: Family Medicine

## 2013-09-07 NOTE — Telephone Encounter (Signed)
Called patient and left message for them to return call at the office   

## 2013-09-07 NOTE — Telephone Encounter (Signed)
Pls let pt know that I have got an alert re safety of using a sleep aid  (restoril) in patients with sleep apnea, (she has this) I would like her to be careful in correct use of the med for restoril, use only as prescribed, and I know she has a longstanding h/o poor sleep, and had benefited a lot from the new drug. Will continue current med, at this time, ask her she feels as if the medication is making her excessively sleepy or giving her a hangover , or causing "too deep sleep" she needs to let me know, as I will reduce the dose. As far as I recall from last visit, her quality of life was much improved on current meds , sio I am not anxious to change anything at this time, unless I need to

## 2013-09-10 NOTE — Telephone Encounter (Signed)
Patient aware.

## 2013-09-14 ENCOUNTER — Other Ambulatory Visit: Payer: Self-pay

## 2013-09-14 DIAGNOSIS — G4700 Insomnia, unspecified: Secondary | ICD-10-CM

## 2013-09-14 MED ORDER — TEMAZEPAM 30 MG PO CAPS: 30.0000 mg | ORAL_CAPSULE | Freq: Every evening | ORAL | Status: DC | PRN

## 2013-09-18 ENCOUNTER — Ambulatory Visit (INDEPENDENT_AMBULATORY_CARE_PROVIDER_SITE_OTHER): Payer: BC Managed Care – PPO | Admitting: Internal Medicine

## 2013-10-06 ENCOUNTER — Other Ambulatory Visit: Payer: Self-pay | Admitting: Family Medicine

## 2013-10-09 ENCOUNTER — Encounter (INDEPENDENT_AMBULATORY_CARE_PROVIDER_SITE_OTHER): Payer: Self-pay | Admitting: Internal Medicine

## 2013-10-09 ENCOUNTER — Ambulatory Visit (INDEPENDENT_AMBULATORY_CARE_PROVIDER_SITE_OTHER): Payer: BC Managed Care – PPO | Admitting: Internal Medicine

## 2013-10-09 VITALS — BP 104/58 | HR 72 | Temp 98.6°F | Ht 66.0 in | Wt 198.2 lb

## 2013-10-09 DIAGNOSIS — K219 Gastro-esophageal reflux disease without esophagitis: Secondary | ICD-10-CM

## 2013-10-09 DIAGNOSIS — K589 Irritable bowel syndrome without diarrhea: Secondary | ICD-10-CM | POA: Insufficient documentation

## 2013-10-09 NOTE — Progress Notes (Signed)
Subjective:     Patient ID: Annette [name], female   DOB: 02-Feb-1961, 53 y.o.   MRN: [id number]  HPI Here today for f/u of her GERD and IBS. She was last seen in December of 2013. She takes Omeprazole BID for GERD and this controls her symptoms. If she skips a dose, she will have breakthru symptoms. She usually has a BM one a day. She still has some abdominal pain when she gets ready to have a BM.  She is exercises regularly.  Appetite is good. She has lost about 10 pounds since her last visit intentionally. She takes the Lomotil on a prn basis.  Recently put on an anti-depressant which constipates her.     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 She had this surgery late last year at [facility] in May of 2013.  She had colonoscopy in October 2010. Family history is negative for CRC.      Review of Systems see hpi Current Outpatient Prescriptions  Medication Sig Dispense Refill  . clotrimazole-betamethasone (LOTRISONE) cream Apply topically as needed.      . diphenoxylate-atropine (LOMOTIL) 2.5-0.025 MG per tablet TAKE ONE TABLET BY MOUTH TWO TIMES A DAY AS NEEDED FOR DIARRHEA OR LOOSE STOOLS  60 tablet  5  . LORazepam (ATIVAN) 1 MG tablet Take 1 tablet (1 mg total) by mouth as needed.  30 tablet  3  . metFORMIN (GLUCOPHAGE XR) 500 MG 24 hr tablet Take 1 tablet (500 mg total) by mouth daily with breakfast.  30 tablet  11  . metoprolol tartrate (LOPRESSOR) 25 MG tablet Take 1 1/2 tab twice daily  90 tablet  3  . naproxen sodium (ANAPROX) 550 MG tablet TAKE ONE TABLET TWICE DAILY, AS NEEDED FOR HEADACHE  40 tablet  0  . omeprazole (PRILOSEC) 20 MG capsule TAKE ONE CAPSULE BY MOUTH TWICE DAILY INSTEAD OF PROTONIX.  180 capsule  1  . promethazine (PHENERGAN) 25 MG tablet Take 1 tablet (25 mg total) by mouth 2 (two) times daily.  30 tablet  0  . temazepam (RESTORIL) 30 MG capsule Take 1 capsule (30 mg total) by mouth at  bedtime as needed for sleep.  30 capsule  3  . traZODone (DESYREL) 100 MG tablet Take 1 tablet (100 mg total) by mouth at bedtime.  30 tablet  3  . triamterene-hydrochlorothiazide (DYAZIDE) 37.5-25 MG per capsule TAKE ONE CAPSULE BY MOUTH DAILY  30 capsule  4  . venlafaxine XR (EFFEXOR-XR) 75 MG 24 hr capsule Take 1 capsule (75 mg total) by mouth daily.  30 capsule  5   No current facility-administered medications for this visit.   Past Medical History  Diagnosis Date  . Sleep apnea 2010    Problems with CPAP  . GERD (gastroesophageal reflux disease)   . Menieres disease     Controlled with triamterene   . Pituitary insufficiency   . IBS (irritable bowel syndrome)   . Depression   . Obesity   . Migraines   . Palpitations   . Bruises easily   . Breast cancer 2007    History of  XRT, onTamoxifen  . Diabetes mellitus     prediabetic  . Hypothyroidism     following chemo amnd radiaition for breast cancer, needed replacement short term  . Hypothyroidism (acquired)     replaced x 1 year   Past Surgical History  Procedure Laterality Date  . Thyroidectomy, partial  11/05/2010  Benign disease  . Appendectomy  06/12/08  . Cholecystectomy  1985  . Breast surgery  2007    Left lumpectomy  . Breast reconstruction      Left reconstructive  . Breast surgery      Mammosite - right side  . Abdominal hysterectomy  1998    Benign, fibroids  . Adrenalectomy  02/16/2012    [facility], splemic trauma, 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  . 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 Skin warm and dry. Oral mucosa is moist.   . Sclera anicteric, conjunctivae is pink. Thyroid not enlarged. No cervical lymphadenopathy. Lungs clear. Heart regular rate and rhythm.   Abdomen is soft. Bowel sounds are positive. No hepatomegaly. No abdominal masses felt. No tenderness.  No edema to lower extremities.       Assessment:    GERD controlled with double dose PPI. BMs are better since starting the anti-depressant. Rarely takes the Lomotil.   Last colonoscopy was in October 2010 and the next  one would be in October 2020.       Plan:      Continue omeprazole at 20 mg by mouth twice a day.    Office visit in one year.

## 2013-10-09 NOTE — Patient Instructions (Addendum)
Continue omeprazole BID. OV in 1 yr

## 2013-10-10 ENCOUNTER — Telehealth: Payer: Self-pay | Admitting: Family Medicine

## 2013-10-10 ENCOUNTER — Other Ambulatory Visit: Payer: Self-pay

## 2013-10-10 MED ORDER — METOPROLOL TARTRATE 25 MG PO TABS: ORAL_TABLET | ORAL | Status: DC

## 2013-10-10 NOTE — Telephone Encounter (Signed)
Med refilled.

## 2013-11-05 ENCOUNTER — Other Ambulatory Visit: Payer: Self-pay | Admitting: Family Medicine

## 2013-11-21 ENCOUNTER — Other Ambulatory Visit: Payer: Self-pay | Admitting: Family Medicine

## 2013-11-27 LAB — COMPLETE METABOLIC PANEL WITH GFR
ALT: 12 U/L (ref 0–35)
AST: 12 U/L (ref 0–37)
Albumin: 4 g/dL (ref 3.5–5.2)
Alkaline Phosphatase: 75 U/L (ref 39–117)
BUN: 14 mg/dL (ref 6–23)
CO2: 26 mEq/L (ref 19–32)
Calcium: 9.6 mg/dL (ref 8.4–10.5)
Chloride: 103 mEq/L (ref 96–112)
Creat: 0.82 mg/dL (ref 0.50–1.10)
GFR, Est African American: >89 mL/min
GFR, Est Non African American: 81 mL/min
Glucose, Bld: 94 mg/dL (ref 70–99)
Potassium: 4.1 mEq/L (ref 3.5–5.3)
Sodium: 140 mEq/L (ref 135–145)
Total Bilirubin: 0.5 mg/dL (ref 0.2–1.2)
Total Protein: 6.6 g/dL (ref 6.0–8.3)

## 2013-11-27 LAB — VITAMIN D 25 HYDROXY (VIT D DEFICIENCY, FRACTURES): Vit D, 25-Hydroxy: 40 ng/mL (ref 30–89)

## 2013-11-27 LAB — LIPID PANEL
Cholesterol: 223 mg/dL — ABNORMAL HIGH (ref 0–200)
HDL: 42 mg/dL (ref >39)
LDL Cholesterol: 157 mg/dL — ABNORMAL HIGH (ref 0–99)
Total CHOL/HDL Ratio: 5.3 Ratio
Triglycerides: 119 mg/dL (ref <150)
VLDL: 24 mg/dL (ref 0–40)

## 2013-11-27 LAB — HEMOGLOBIN A1C
Hgb A1c MFr Bld: 6.1 % — ABNORMAL HIGH (ref <5.7)
Mean Plasma Glucose: 128 mg/dL — ABNORMAL HIGH (ref <117)

## 2013-11-28 ENCOUNTER — Telehealth: Payer: Self-pay | Admitting: Family Medicine

## 2013-11-28 DIAGNOSIS — F411 Generalized anxiety disorder: Secondary | ICD-10-CM

## 2013-11-28 MED ORDER — VENLAFAXINE HCL ER 75 MG PO CP24: 75.0000 mg | ORAL_CAPSULE | Freq: Every day | ORAL | Status: DC

## 2013-11-28 NOTE — Telephone Encounter (Signed)
meds refilled 

## 2013-11-29 ENCOUNTER — Encounter: Payer: BC Managed Care – PPO | Admitting: Family Medicine

## 2013-12-04 ENCOUNTER — Other Ambulatory Visit: Payer: Self-pay | Admitting: Family Medicine

## 2013-12-10 ENCOUNTER — Other Ambulatory Visit: Payer: Self-pay | Admitting: Family Medicine

## 2013-12-19 ENCOUNTER — Other Ambulatory Visit: Payer: Self-pay | Admitting: Family Medicine

## 2013-12-23 ENCOUNTER — Emergency Department (HOSPITAL_COMMUNITY)
Admission: EM | Admit: 2013-12-23 | Discharge: 2013-12-24 | Disposition: A | Discharge status: Home / Self Care | Payer: BC Managed Care – PPO | Attending: Emergency Medicine | Admitting: Emergency Medicine

## 2013-12-23 ENCOUNTER — Emergency Department (HOSPITAL_COMMUNITY): Payer: BC Managed Care – PPO

## 2013-12-23 ENCOUNTER — Encounter (HOSPITAL_COMMUNITY): Payer: Self-pay | Admitting: Emergency Medicine

## 2013-12-23 DIAGNOSIS — R197 Diarrhea, unspecified: Secondary | ICD-10-CM | POA: Insufficient documentation

## 2013-12-23 DIAGNOSIS — R109 Unspecified abdominal pain: Secondary | ICD-10-CM | POA: Insufficient documentation

## 2013-12-23 DIAGNOSIS — Z9089 Acquired absence of other organs: Secondary | ICD-10-CM | POA: Insufficient documentation

## 2013-12-23 DIAGNOSIS — Z90710 Acquired absence of both cervix and uterus: Secondary | ICD-10-CM | POA: Insufficient documentation

## 2013-12-23 DIAGNOSIS — F329 Major depressive disorder, single episode, unspecified: Secondary | ICD-10-CM | POA: Insufficient documentation

## 2013-12-23 DIAGNOSIS — Z880 Allergy status to penicillin: Secondary | ICD-10-CM | POA: Insufficient documentation

## 2013-12-23 DIAGNOSIS — F3289 Other specified depressive episodes: Secondary | ICD-10-CM | POA: Insufficient documentation

## 2013-12-23 DIAGNOSIS — Z8719 Personal history of other diseases of the digestive system: Secondary | ICD-10-CM | POA: Insufficient documentation

## 2013-12-23 DIAGNOSIS — E669 Obesity, unspecified: Secondary | ICD-10-CM | POA: Insufficient documentation

## 2013-12-23 DIAGNOSIS — Z853 Personal history of malignant neoplasm of breast: Secondary | ICD-10-CM | POA: Insufficient documentation

## 2013-12-23 DIAGNOSIS — E119 Type 2 diabetes mellitus without complications: Secondary | ICD-10-CM | POA: Insufficient documentation

## 2013-12-23 DIAGNOSIS — Z8669 Personal history of other diseases of the nervous system and sense organs: Secondary | ICD-10-CM | POA: Insufficient documentation

## 2013-12-23 DIAGNOSIS — F17200 Nicotine dependence, unspecified, uncomplicated: Secondary | ICD-10-CM | POA: Insufficient documentation

## 2013-12-23 DIAGNOSIS — G8929 Other chronic pain: Secondary | ICD-10-CM | POA: Insufficient documentation

## 2013-12-23 DIAGNOSIS — Z79899 Other long term (current) drug therapy: Secondary | ICD-10-CM | POA: Insufficient documentation

## 2013-12-23 DIAGNOSIS — Z8679 Personal history of other diseases of the circulatory system: Secondary | ICD-10-CM | POA: Insufficient documentation

## 2013-12-23 LAB — CBC WITH DIFFERENTIAL/PLATELET
Basophils Absolute: 0.1 10*3/uL (ref 0.0–0.1)
Basophils Relative: 1 % (ref 0–1)
Eosinophils Absolute: 0.3 10*3/uL (ref 0.0–0.7)
Eosinophils Relative: 3 % (ref 0–5)
HCT: 44.2 % (ref 36.0–46.0)
Hemoglobin: 15.8 g/dL — ABNORMAL HIGH (ref 12.0–15.0)
Lymphocytes Relative: 49 % — ABNORMAL HIGH (ref 12–46)
Lymphs Abs: 5.4 10*3/uL — ABNORMAL HIGH (ref 0.7–4.0)
MCH: 34.1 pg — ABNORMAL HIGH (ref 26.0–34.0)
MCHC: 35.7 g/dL (ref 30.0–36.0)
MCV: 95.5 fL (ref 78.0–100.0)
Monocytes Absolute: 0.9 10*3/uL (ref 0.1–1.0)
Monocytes Relative: 9 % (ref 3–12)
Neutro Abs: 4.3 10*3/uL (ref 1.7–7.7)
Neutrophils Relative %: 39 % — ABNORMAL LOW (ref 43–77)
Platelets: 327 10*3/uL (ref 150–400)
RBC: 4.63 MIL/uL (ref 3.87–5.11)
RDW: 12.7 % (ref 11.5–15.5)
WBC: 11 10*3/uL — ABNORMAL HIGH (ref 4.0–10.5)

## 2013-12-23 LAB — COMPREHENSIVE METABOLIC PANEL
ALT: 19 U/L (ref 0–35)
AST: 19 U/L (ref 0–37)
Albumin: 4 g/dL (ref 3.5–5.2)
Alkaline Phosphatase: 79 U/L (ref 39–117)
BUN: 12 mg/dL (ref 6–23)
CO2: 31 mEq/L (ref 19–32)
Calcium: 9.8 mg/dL (ref 8.4–10.5)
Chloride: 100 mEq/L (ref 96–112)
Creatinine, Ser: 0.86 mg/dL (ref 0.50–1.10)
GFR calc Af Amer: 87 mL/min — ABNORMAL LOW (ref >90)
GFR calc non Af Amer: 75 mL/min — ABNORMAL LOW (ref >90)
Glucose, Bld: 86 mg/dL (ref 70–99)
Potassium: 3.3 mEq/L — ABNORMAL LOW (ref 3.7–5.3)
Sodium: 140 mEq/L (ref 137–147)
Total Bilirubin: 0.6 mg/dL (ref 0.3–1.2)
Total Protein: 7.7 g/dL (ref 6.0–8.3)

## 2013-12-23 LAB — URINALYSIS, ROUTINE W REFLEX MICROSCOPIC
Bilirubin Urine: NEGATIVE
Glucose, UA: NEGATIVE mg/dL
Hgb urine dipstick: NEGATIVE
Leukocytes, UA: NEGATIVE
Nitrite: NEGATIVE
Protein, ur: NEGATIVE mg/dL
Specific Gravity, Urine: 1.01 (ref 1.005–1.030)
Urobilinogen, UA: 0.2 mg/dL (ref 0.0–1.0)
pH: 7 (ref 5.0–8.0)

## 2013-12-23 NOTE — ED Notes (Signed)
Patient states "I had surgery in 2013 to remove my adrenal gland and the doctor nicked my spleen so they had to remove it too. I have had some pain in my left side since the surgery but it has been worse for the last week."

## 2013-12-23 NOTE — ED Notes (Signed)
Pt complaining of left flank pain that has been on & off since having surgery a year ago. Pt states increase in pain over the last week. Denies any urinary problems.

## 2013-12-23 NOTE — ED Provider Notes (Signed)
CSN: [id number]     Arrival date & time 12/23/13  2050 History  This chart was scribed for Annette Cable, MD by Zettie Pho, ED Scribe. This patient was seen in room APA16A/APA16A and the patient's care was started at 11:22 PM.    Chief Complaint  Patient presents with  . Flank Pain   Patient is a 53 y.o. female presenting with flank pain. The history is provided by the patient. No language interpreter was used.  Flank Pain This is a chronic problem. The current episode started more than 1 week ago. The problem occurs constantly. The problem has been gradually worsening. Pertinent negatives include no chest pain and no shortness of breath. Nothing aggravates the symptoms. The symptoms are relieved by lying down and rest. She has tried nothing for the symptoms.   HPI Comments: Annette [name] is a 53 y.o. female who presents to the Emergency Department complaining of a pain to the left flank that she states is chronic in nature after an adrenalectomy and splenectomy in 2013, but has been progressively worsening over the past week. Patient denies any potential injury or trauma to the area. Patient states that the pain is alleviated with lying down/rest. She reports some associated diarrhea secondary to her IBS and denies any recent changes. She denies fever, emesis, chest pain, shortness of breath, blood in the stool, dysuria, vaginal bleeding or discharge. Patient also has a surgical history of thyroidectomy, appendectomy, cholecystectomy, breast reconstruction, and abdominal hysterectomy. She denies history of nephrolithiasis. Patient also has a history of GERD, Menieres disease, pituitary insufficiency, IBS, DM, and hypothyroidism.   Past Medical History  Diagnosis Date  . Sleep apnea 2010    Problems with CPAP  . GERD (gastroesophageal reflux disease)   . Menieres disease     Controlled with triamterene   . Pituitary insufficiency   . IBS (irritable bowel syndrome)   . Depression   .  Obesity   . Migraines   . Palpitations   . Bruises easily   . Breast cancer 2007    History of  XRT, onTamoxifen  . Diabetes mellitus     prediabetic  . Hypothyroidism     following chemo amnd radiaition for breast cancer, needed replacement short term  . Hypothyroidism (acquired)     replaced x 1 year   Past Surgical History  Procedure Laterality Date  . Thyroidectomy, partial  11/05/2010    Benign disease  . Appendectomy  06/12/08  . Cholecystectomy  1985  . Breast surgery  2007    Left lumpectomy  . Breast reconstruction      Left reconstructive  . Breast surgery      Mammosite - right side  . Abdominal hysterectomy  1998    Benign, fibroids  . Adrenalectomy  02/16/2012    [facility], splemic trauma, 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  . Splenectomy, total  0/62/3762    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   Family History  Problem Relation Age of Onset  . Diabetes Mother   . Heart disease Father   . Cancer Paternal Grandmother     Breast   History  Substance Use Topics  . Smoking status: Current Some Day Smoker -- 0.30 packs/day    Types: Cigarettes  . Smokeless tobacco: Never Used     Comment: 1/2 pack a day  . Alcohol Use: No     Comment: Encouraged to quit smoking. She has tried the nicotone gum and patches but did not  help   OB History   Grav Para Term Preterm Abortions TAB SAB Ect Mult Living                 Review of Systems  Constitutional: Negative for fever.  Respiratory: Negative for shortness of breath.   Cardiovascular: Negative for chest pain.  Gastrointestinal: Positive for diarrhea (baseline). Negative for vomiting and blood in stool.  Genitourinary: Positive for flank pain. Negative for dysuria, vaginal bleeding and vaginal discharge.  All other systems reviewed and are negative.   Allergies  Penicillins  Home Medications   Current Outpatient Rx  Name  Route  Sig  Dispense  Refill  . clotrimazole-betamethasone (LOTRISONE)  cream   Topical   Apply topically as needed.         Marland Kitchen LORazepam (ATIVAN) 1 MG tablet   Oral   Take 1 tablet (1 mg total) by mouth as needed.   30 tablet   3   . metFORMIN (GLUCOPHAGE-XR) 500 MG 24 hr tablet      TAKE 1 TABLET (500 MG TOTAL) BY MOUTH DAILY WITH BREAKFAST.   30 tablet   11   . metoprolol tartrate (LOPRESSOR) 25 MG tablet      Take 1 1/2 tab twice daily   90 tablet   3   . naproxen sodium (ANAPROX) 550 MG tablet      TAKE ONE TABLET TWICE DAILY, AS NEEDED FOR HEADACHE   40 tablet   0   . temazepam (RESTORIL) 30 MG capsule      TAKE ONE CAPSULE BY MOUTH AT BEDTIME   30 capsule   1   . traZODone (DESYREL) 100 MG tablet      TAKE 1 TABLET (100 MG TOTAL) BY MOUTH AT BEDTIME.   30 tablet   3    Triage Vitals: BP 108/48  Pulse 60  Temp(Src) 97.7 F (36.5 C) (Oral)  Resp 20  Ht 5\' 6"  (1.676 m)  Wt 188 lb (85.276 kg)  BMI 30.36 kg/m2  SpO2 98%  Physical Exam  Nursing note and vitals reviewed.  CONSTITUTIONAL: Well developed/well nourished HEAD: Normocephalic/atraumatic EYES: EOMI/PERRL ENMT: Mucous membranes moist NECK: supple no meningeal signs SPINE:entire spine nontender CV: S1/S2 noted, no murmurs/rubs/gallops noted LUNGS: Lungs are clear to auscultation bilaterally, no apparent distress ABDOMEN: soft, mild LLQ tenderness, no rebound or guarding GU: left flank tenderness, no bruising is noted NEURO: Pt is awake/alert, moves all extremitiesx4 EXTREMITIES: pulses normal, full ROM SKIN: warm, color normal PSYCH: no abnormalities of mood noted  ED Course  Procedures  DIAGNOSTIC STUDIES: Oxygen Saturation is 98% on room air, normal by my interpretation.    COORDINATION OF CARE: 11:27 PM- Ordered blood labs (CMP, CBC) and UA. Will order a CT of the abdomen. Offered patient pain medication, but she declined and states it's unnecessary at this time. Discussed treatment plan with patient at bedside and patient verbalized agreement.    1:03 AM Pt improved Does not want pain meds We discussed CT findings - no acute process Stable for d/c home We discussed strict ER return precautions  Labs Review Labs Reviewed  URINALYSIS, ROUTINE W REFLEX MICROSCOPIC - Abnormal; Notable for the following:    Ketones, ur TRACE (*)    All other components within normal limits  CBC WITH DIFFERENTIAL - Abnormal; Notable for the following:    WBC 11.0 (*)    Hemoglobin 15.8 (*)    MCH 34.1 (*)    Neutrophils Relative % 39 (*)  Lymphocytes Relative 49 (*)    Lymphs Abs 5.4 (*)    All other components within normal limits  COMPREHENSIVE METABOLIC PANEL - Abnormal; Notable for the following:    Potassium 3.3 (*)    GFR calc non Af Amer 75 (*)    GFR calc Af Amer 87 (*)    All other components within normal limits   Imaging Review Ct Abdomen Pelvis Wo Contrast  12/24/2013   CLINICAL DATA:  Left flank pain, history of hysterectomy, cholecystectomy, adrenalectomy, splenectomy.  EXAM: CT ABDOMEN AND PELVIS WITHOUT CONTRAST  TECHNIQUE: Multidetector CT imaging of the abdomen and pelvis was performed following the standard protocol without intravenous contrast.  COMPARISON:  MR ABDOMEN WO/W CM dated 08/25/2011  FINDINGS: Included view of the lung bases are clear. The visualized heart and pericardium are unremarkable.  Kidneys are orthotopic, no nephrolithiasis, hydronephrosis. Limited assessment for renal masses on this noncontrast examination. Ureters are normal in course and caliber, no urolithiasis. Urinary bladder is well distended, harboring no intravesicular calculi. Phleboliths in the pelvis.  The patient is status post cholecystectomy, appendectomy, left adrenalectomy, splenectomy, hysterectomy.  The liver, pancreas are unremarkable. 2 cm fatty mass in right adrenal gland consistent with a benign adenoma as previously reported. Aortoiliac vessels are normal course and caliber, unremarkable.  Mild sigmoid diverticulosis. Stomach and  small bowel are normal in course and caliber, known inflammatory changes. No intraperitoneal free fluid nor free air.  Severe L4-5 and L5-S1 degenerative disc disease resulting in moderate to severe L4-5, severe L5-S1 neural foraminal narrowing. Included soft tissue are nonsuspicious.  IMPRESSION: No urolithiasis nor acute intra-abdominal/pelvic process.  Status post cholecystectomy, appendectomy, left adrenalectomy, splenectomy, hysterectomy.   Electronically Signed   By: Elon Alas   On: 12/24/2013 00:42      MDM   Final diagnoses:  None    Nursing notes including past medical history and social history reviewed and considered in documentation Labs/vital reviewed and considered   I personally performed the services described in this documentation, which was scribed in my presence. The recorded information has been reviewed and is accurate.       Annette Cable, MD 12/24/13 [phone]

## 2013-12-24 NOTE — Discharge Instructions (Signed)
Abdominal (belly) pain can be caused by many things. any cases can be observed and treated at home after initial evaluation in the emergency department. Even though you are being discharged home, abdominal pain can be unpredictable. Therefore, you need a repeated exam if your pain does not resolve, returns, or worsens. Most patients with abdominal pain don't have to be admitted to the hospital or have surgery, but serious problems like appendicitis and gallbladder attacks can start out as nonspecific pain. Many abdominal conditions cannot be diagnosed in one visit, so follow-up evaluations are very important. °SEEK IMMEDIATE MEDICAL ATTENTION IF: °The pain does not go away or becomes severe, particularly over the next 8-12 hours.  °A temperature above 100.4F develops.  °Repeated vomiting occurs (multiple episodes).  °The pain becomes localized to portions of the abdomen.   In an adult, the left lower portion of the abdomen could be colitis or diverticulitis.  °Blood is being passed in stools or vomit (bright red or black tarry stools).  °Return also if you develop chest pain, difficulty breathing, dizziness or fainting, or become confused, poorly responsive, or inconsolable. ° °

## 2013-12-24 NOTE — ED Notes (Signed)
Pt alert & oriented x4, stable gait. Patient given discharge instructions, paperwork & prescription(s). Patient  instructed to stop at the registration desk to finish any additional paperwork. Patient verbalized understanding. Pt left department w/ no further questions. 

## 2014-01-18 ENCOUNTER — Other Ambulatory Visit: Payer: Self-pay | Admitting: Family Medicine

## 2014-01-28 ENCOUNTER — Encounter: Payer: BC Managed Care – PPO | Admitting: Family Medicine

## 2014-01-31 ENCOUNTER — Other Ambulatory Visit: Payer: Self-pay | Admitting: Family Medicine

## 2014-02-01 ENCOUNTER — Encounter (HOSPITAL_COMMUNITY): Payer: BC Managed Care – PPO | Attending: Hematology and Oncology

## 2014-02-01 ENCOUNTER — Encounter (HOSPITAL_COMMUNITY): Payer: BC Managed Care – PPO

## 2014-02-01 ENCOUNTER — Encounter (HOSPITAL_COMMUNITY): Payer: Self-pay

## 2014-02-01 VITALS — BP 92/56 | HR 62 | Temp 97.8°F | Resp 16 | Wt 188.5 lb

## 2014-02-01 DIAGNOSIS — Z923 Personal history of irradiation: Secondary | ICD-10-CM | POA: Insufficient documentation

## 2014-02-01 DIAGNOSIS — J4489 Other specified chronic obstructive pulmonary disease: Secondary | ICD-10-CM | POA: Insufficient documentation

## 2014-02-01 DIAGNOSIS — F411 Generalized anxiety disorder: Secondary | ICD-10-CM | POA: Insufficient documentation

## 2014-02-01 DIAGNOSIS — C50919 Malignant neoplasm of unspecified site of unspecified female breast: Secondary | ICD-10-CM

## 2014-02-01 DIAGNOSIS — E669 Obesity, unspecified: Secondary | ICD-10-CM | POA: Insufficient documentation

## 2014-02-01 DIAGNOSIS — G4700 Insomnia, unspecified: Secondary | ICD-10-CM | POA: Insufficient documentation

## 2014-02-01 DIAGNOSIS — E230 Hypopituitarism: Secondary | ICD-10-CM | POA: Insufficient documentation

## 2014-02-01 DIAGNOSIS — F329 Major depressive disorder, single episode, unspecified: Secondary | ICD-10-CM

## 2014-02-01 DIAGNOSIS — K589 Irritable bowel syndrome without diarrhea: Secondary | ICD-10-CM | POA: Insufficient documentation

## 2014-02-01 DIAGNOSIS — F17200 Nicotine dependence, unspecified, uncomplicated: Secondary | ICD-10-CM | POA: Insufficient documentation

## 2014-02-01 DIAGNOSIS — G43909 Migraine, unspecified, not intractable, without status migrainosus: Secondary | ICD-10-CM | POA: Insufficient documentation

## 2014-02-01 DIAGNOSIS — Z853 Personal history of malignant neoplasm of breast: Secondary | ICD-10-CM | POA: Insufficient documentation

## 2014-02-01 DIAGNOSIS — K219 Gastro-esophageal reflux disease without esophagitis: Secondary | ICD-10-CM | POA: Insufficient documentation

## 2014-02-01 DIAGNOSIS — Z9221 Personal history of antineoplastic chemotherapy: Secondary | ICD-10-CM | POA: Insufficient documentation

## 2014-02-01 DIAGNOSIS — C50419 Malignant neoplasm of upper-outer quadrant of unspecified female breast: Secondary | ICD-10-CM

## 2014-02-01 DIAGNOSIS — F3289 Other specified depressive episodes: Secondary | ICD-10-CM

## 2014-02-01 DIAGNOSIS — J449 Chronic obstructive pulmonary disease, unspecified: Secondary | ICD-10-CM

## 2014-02-01 DIAGNOSIS — Z170 Estrogen receptor positive status [ER+]: Secondary | ICD-10-CM | POA: Insufficient documentation

## 2014-02-01 DIAGNOSIS — Z09 Encounter for follow-up examination after completed treatment for conditions other than malignant neoplasm: Secondary | ICD-10-CM | POA: Insufficient documentation

## 2014-02-01 DIAGNOSIS — I10 Essential (primary) hypertension: Secondary | ICD-10-CM | POA: Insufficient documentation

## 2014-02-01 LAB — CBC WITH DIFFERENTIAL/PLATELET
Basophils Absolute: 0.1 10*3/uL (ref 0.0–0.1)
Basophils Relative: 1 % (ref 0–1)
Eosinophils Absolute: 0.3 10*3/uL (ref 0.0–0.7)
Eosinophils Relative: 3 % (ref 0–5)
HCT: 43.8 % (ref 36.0–46.0)
Hemoglobin: 15.4 g/dL — ABNORMAL HIGH (ref 12.0–15.0)
Lymphocytes Relative: 46 % (ref 12–46)
Lymphs Abs: 4.6 10*3/uL — ABNORMAL HIGH (ref 0.7–4.0)
MCH: 33.6 pg (ref 26.0–34.0)
MCHC: 35.2 g/dL (ref 30.0–36.0)
MCV: 95.6 fL (ref 78.0–100.0)
Monocytes Absolute: 1.1 10*3/uL — ABNORMAL HIGH (ref 0.1–1.0)
Monocytes Relative: 11 % (ref 3–12)
Neutro Abs: 3.9 10*3/uL (ref 1.7–7.7)
Neutrophils Relative %: 39 % — ABNORMAL LOW (ref 43–77)
Platelets: 358 10*3/uL (ref 150–400)
RBC: 4.58 MIL/uL (ref 3.87–5.11)
RDW: 13 % (ref 11.5–15.5)
WBC: 9.9 10*3/uL (ref 4.0–10.5)

## 2014-02-01 LAB — COMPREHENSIVE METABOLIC PANEL
ALT: 12 U/L (ref 0–35)
AST: 16 U/L (ref 0–37)
Albumin: 3.8 g/dL (ref 3.5–5.2)
Alkaline Phosphatase: 82 U/L (ref 39–117)
BUN: 10 mg/dL (ref 6–23)
CO2: 25 mEq/L (ref 19–32)
Calcium: 9.9 mg/dL (ref 8.4–10.5)
Chloride: 103 mEq/L (ref 96–112)
Creatinine, Ser: 0.82 mg/dL (ref 0.50–1.10)
GFR calc Af Amer: >90 mL/min (ref >90)
GFR calc non Af Amer: 80 mL/min — ABNORMAL LOW (ref >90)
Glucose, Bld: 94 mg/dL (ref 70–99)
Potassium: 3.6 mEq/L — ABNORMAL LOW (ref 3.7–5.3)
Sodium: 142 mEq/L (ref 137–147)
Total Bilirubin: 0.6 mg/dL (ref 0.3–1.2)
Total Protein: 7.1 g/dL (ref 6.0–8.3)

## 2014-02-01 NOTE — Progress Notes (Signed)
[facility]  OFFICE PROGRESS NOTE  [name], MD [address], [address] [place] [address]  DIAGNOSIS: Malignant neoplasm of breast (female), unspecified site - Plan: CBC with Differential, Comprehensive metabolic panel, CEA, Cancer antigen 27.29, MM Digital Diagnostic Bilat, CBC with Differential, Comprehensive metabolic panel, CEA, Cancer antigen 27.29  Chief Complaint  Patient presents with  . Breast Cancer    CURRENT THERAPY: Watchful expectation and surveillance  INTERVAL HISTORY: Annette [name] 53 y.o. female returns for followup of stage I breast cancer, HER-2/neu overexpressed, originally diagnosed 12/10/2005, status post lumpectomy, sentinel lobe biopsy, Taxotere and Cytoxan plus Herceptin for 6 cycles followed by additional Herceptin out to 52 weeks with radiation therapy  administered after the completion of chemotherapy, followed by 5 years of tamoxifen until November of 2012. She has also undergone left adrenalectomy with inadvertent damage to the spleen at the time of surgery  requiring splenectomy on 02/16/2012 with a history of an enlarging left adrenal tumor in the setting of bilateral adrenal adenomas by imaging. She continues to work full-time. Self breast examinations are unremarkable which does have tenderness involving the left breast. She denies any lymphedema, fever, night sweats, abdominal pain, need for antibiotics, lower extremity swelling or redness, nausea, vomiting, diarrhea, constipation, dysuria, hematuria, vaginal bleeding or discharge, skin rash, headache, or seizures.  MEDICAL HISTORY: Past Medical History  Diagnosis Date  . Sleep apnea 2010    Problems with CPAP  . GERD (gastroesophageal reflux disease)   . Menieres disease     Controlled with triamterene   . Pituitary insufficiency   . IBS (irritable bowel syndrome)   . Depression   . Obesity   . Migraines   . Palpitations   . Bruises  easily   . Breast cancer 2007    History of  XRT, onTamoxifen  . Diabetes mellitus     prediabetic  . Hypothyroidism     following chemo amnd radiaition for breast cancer, needed replacement short term  . Hypothyroidism (acquired)     replaced x 1 year    INTERIM HISTORY: has HYPOTHYROIDISM, BORDERLINE; VITAMIN D DEFICIENCY; HYPERLIPIDEMIA; OBESITY; IBS; ABNORMAL THYROID FUNCTION TESTS; BREAST CANCER, HX OF; THYROID MASS; Malignant neoplasm of breast (female), unspecified site; Abdominal or pelvic swelling, mass or lump, unspecified site; Meniere's disease; Sleep apnea; Adrenal adenoma; Chest pain, atypical; Prediabetes; Routine general medical examination at a health care facility; Insomnia; HTN, goal below 130/80; Nicotine dependence; GAD (generalized anxiety disorder); Metabolic syndrome X; GERD (gastroesophageal reflux disease); and IBS (irritable bowel syndrome) on her problem list.   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 No LVI was seen. Ki-67 marker was 37%,  treated with dose dense Taxotere and Cytoxan  for 6 cycles and Herceptin for 52 weeks. She then had radiation therapy after the completion of chemotherapy. She finished tamoxifen after 5 years of therapy in November 2012.  ALLERGIES:  is allergic to penicillins.  MEDICATIONS: has a current medication list which includes the following prescription(s): clotrimazole-betamethasone, lorazepam, metformin, metoprolol tartrate, naproxen sodium, omeprazole, temazepam, trazodone, triamterene-hydrochlorothiazide, venlafaxine xr, human chorionic gonadotropin, and omeprazole.  SURGICAL HISTORY:  Past Surgical History  Procedure Laterality Date  . Thyroidectomy, partial  11/05/2010    Benign disease  . Appendectomy  06/12/08  . Cholecystectomy  1985  .  Breast surgery  2007    Left  lumpectomy  . Breast reconstruction      Left reconstructive  . Breast surgery      Mammosite - right side  . Abdominal hysterectomy  1998    Benign, fibroids  . Adrenalectomy  02/16/2012    [facility], splemic trauma, 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  . Splenectomy, total  2/29/7989    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    FAMILY HISTORY: family history includes Cancer in her paternal grandmother; Diabetes in her mother; Heart disease in her father.  SOCIAL HISTORY:  reports that she has been smoking Cigarettes.  She has been smoking about 0.30 packs per day. She has never used smokeless tobacco. She reports that she does not drink alcohol or use illicit drugs.  REVIEW OF SYSTEMS:  Other than that discussed above is noncontributory.  PHYSICAL EXAMINATION: ECOG PERFORMANCE STATUS: 0 - Asymptomatic  Blood pressure 92/56, pulse 62, temperature 97.8 F (36.6 C), temperature source Oral, resp. rate 16, weight 188 lb 8 oz (85.503 kg).  GENERAL:alert, no distress and comfortable SKIN: skin color, texture, turgor are normal, no rashes or significant lesions EYES: PERLA; Conjunctiva are pink and non-injected, sclera clear SINUSES: No redness or tenderness over maxillary or ethmoid sinuses OROPHARYNX:no exudate, no erythema on lips, buccal mucosa, or tongue. NECK: supple, thyroid normal size, non-tender, without nodularity. No masses CHEST: Status post left breast lumpectomy with telangiectatic changes in the breast. Right breast reduction is evident with no masses palpable although the left breast feels lumpy. Mildly increased AP diameter. LYMPH:  no palpable lymphadenopathy in the cervical, axillary or inguinal LUNGS: clear to auscultation and percussion with normal breathing effort HEART: regular rate & rhythm and no murmurs. ABDOMEN:abdomen soft, non-tender and normal bowel sounds MUSCULOSKELETAL:no cyanosis of digits and no clubbing. Range of motion normal.  NEURO: alert &  oriented x 3 with fluent speech, no focal motor/sensory deficits   LABORATORY DATA: Office Visit on 02/01/2014  Component Date Value Ref Range Status  . WBC 02/01/2014 9.9  4.0 - 10.5 K/uL Final  . RBC 02/01/2014 4.58  3.87 - 5.11 MIL/uL Final  . Hemoglobin 02/01/2014 15.4* 12.0 - 15.0 g/dL Final  . HCT 02/01/2014 43.8  36.0 - 46.0 % Final  . MCV 02/01/2014 95.6  78.0 - 100.0 fL Final  . MCH 02/01/2014 33.6  26.0 - 34.0 pg Final  . MCHC 02/01/2014 35.2  30.0 - 36.0 g/dL Final  . RDW 02/01/2014 13.0  11.5 - 15.5 % Final  . Platelets 02/01/2014 358  150 - 400 K/uL Final  . Neutrophils Relative % 02/01/2014 39* 43 - 77 % Final  . Neutro Abs 02/01/2014 3.9  1.7 - 7.7 K/uL Final  . Lymphocytes Relative 02/01/2014 46  12 - 46 % Final  . Lymphs Abs 02/01/2014 4.6* 0.7 - 4.0 K/uL Final  . Monocytes Relative 02/01/2014 11  3 - 12 % Final  . Monocytes Absolute 02/01/2014 1.1* 0.1 - 1.0 K/uL Final  . Eosinophils Relative 02/01/2014 3  0 - 5 % Final  . Eosinophils Absolute 02/01/2014 0.3  0.0 - 0.7 K/uL Final  . Basophils Relative 02/01/2014 1  0 - 1 % Final  . Basophils Absolute 02/01/2014 0.1  0.0 - 0.1 K/uL Final    PATHOLOGY:   Surgical Pathology Tissue Exam5/15/2013  [facility]  Specimen  Other-specify source in comments   Result Narrative    ACCESSION NUMBER:  [id number] RECEIVED: 02/17/2012 ORDERING PHYSICIAN:  Fredirick Maudlin , MD PATIENT NAME:  [name], Annette [name] REE SURGICAL PATHOLOGY REPORT  FINAL PATHOLOGIC DIAGNOSIS  MICROSCOPIC EXAMINATION PERFORMED AND SUPPORTS DIAGNOSES   A.  SPLEEN, RESECTION:      Unremarkable spleen.  B.  LEFT ADRENAL GLAND, EXCISION:      Adrenal cortical adenoma (30 g).   I have personally reviewed the slides and/or other related materials referenced, and have edited the report as part of my pathologic assessment and final interpretation.  Electronically Signed Out By:   Jerline Pain, M.D., Pathology 02/21/2012  14:57:55  saq/skt  Specimen(s) Received A: Spleen B: Left adrenal gland  Clinical History None provided.     Gross Description A.  Received labeled "spleen" is a 124 g, 8.2 x 6.7 x 4 cm splenectomy with a small amount of attached fat and adherent blood clot. The splenic capsule is predominantly smooth and tan-purple with a 2 x 1.5 cm capsular tear along one edge. The spleen is soft, tan-pink and no intraparenchymal masses are noted. Representative sections are submitted in A1-A3.  B.  Received labeled "left adrenal gland" is a 30 g adrenal gland which is received transected by the surgeon which, when reapproximated has an overall measurement of 8.5 x 4.5 x 2.8 cm. The cut surface reveals a central well-circumscribed, yellow cortical nodule measuring 4 x 3.5 x 2.8 cm. The nodule is focally compressing the adrenal gland. Within the surrounding soft tissue there is a 0.3 cm yellow nodule which has a similar appearance to the cortical nodule representative sections are submitted as follows:       B1-2          representative full-thickness cross-section of the adrenal gland to included the cortical nodule, the section divided B3-4          representative full-thickness cross-section of the adrenal gland to include the cortical nodule, the section divided B5          representative section of the cortical nodule with the separate nodule and the surrounding fatty tissue B6          representative section of normal appearing adrenal gland  Linward Natal, M.S       Urinalysis    Component Value Date/Time   COLORURINE YELLOW 12/23/2013 2210   APPEARANCEUR CLEAR 12/23/2013 2210   LABSPEC 1.010 12/23/2013 2210   PHURINE 7.0 12/23/2013 2210   GLUCOSEU NEGATIVE 12/23/2013 2210   HGBUR NEGATIVE 12/23/2013 2210   BILIRUBINUR NEGATIVE 12/23/2013 2210   BILIRUBINUR neg 01/20/2012 1128   KETONESUR TRACE* 12/23/2013 2210   PROTEINUR NEGATIVE 12/23/2013 2210   PROTEINUR trace  01/20/2012 1128   UROBILINOGEN 0.2 12/23/2013 2210   UROBILINOGEN 0.2 01/20/2012 1128   NITRITE NEGATIVE 12/23/2013 2210   NITRITE neg 01/20/2012 1128   LEUKOCYTESUR NEGATIVE 12/23/2013 2210    RADIOGRAPHIC STUDIES: No results found.  ASSESSMENT:  1. 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 No LVI was seen. Ki-67 marker was 37%, and I treated with dose dense Taxotere and Cytoxan via chemotherapy for 6 cycles and Herceptin for 52 weeks. She then had radiation therapy after the completion of chemotherapy. She finished tamoxifen after 5 years of therapy in November 2012. She presented here for the first time February 2007.  2. Pituitary insufficiency, seeing an endocrinologist in [facility]  3. Bilateral adrenal adenomas, S/P left adrenalectomy  with operative injury to spleen requiring splenectomy in May 2013 in [place] by Dr. Alex Gardener.  4. Irritable bowel syndrome with EGD and colonoscopy by Dr. Laural Golden.  5. Migraine headaches.  6. Appendectomy in September 2009 by Dr. Valentino Saxon.  7. Depression, on Lamictal.  8. Obesity, weighing 212 still.  9. Chronic obstructive pulmonary disease secondary to longstanding smoking history, still smoking.       PLAN:  #1. Continue watchful expectation and surveillance with monthly self breast examination. #2. Mammogram has been scheduled for May of this year. #3. Followup in one year with CBC, chem profile, CEA, and CA 27-29. Patient was counseled to be sure to call should she develop fever so that prompt intervention can be implemented in light of her splenectomy and the risk of post splenectomy sepsis syndrome. The patient expressed understanding of this strategy.   All questions were answered. The patient knows to call the clinic with any problems, questions or concerns. We can  certainly see the patient much sooner if necessary.   I spent 25 minutes counseling the patient face to face. The total time spent in the appointment was 30 minutes.    Farrel Gobble, MD 02/01/2014 1:03 PM  DISCLAIMER:  This note was dictated with voice recognition software.  Similar sounding words can inadvertently be transcribed inaccurately and may not be corrected upon review.

## 2014-02-01 NOTE — Patient Instructions (Signed)
[facility] Discharge Instructions  RECOMMENDATIONS MADE BY THE CONSULTANT AND ANY TEST RESULTS WILL BE SENT TO YOUR REFERRING PHYSICIAN.  EXAM FINDINGS BY THE PHYSICIAN TODAY AND SIGNS OR SYMPTOMS TO REPORT TO CLINIC OR PRIMARY PHYSICIAN: Exam and findings as discussed by Dr. Barnet Glasgow. Report fevers, any lumps, bone pain or shortness of breath.  Need to have your mammogram and we will try to get that scheduled for you.  MEDICATIONS PRESCRIBED:  none  INSTRUCTIONS/FOLLOW-UP: Follow-up in 1 year.  Thank you for choosing [facility] to provide your oncology and hematology care.  To afford each patient quality time with our providers, please arrive at least 15 minutes before your scheduled appointment time.  With your help, our goal is to use those 15 minutes to complete the necessary work-up to ensure our physicians have the information they need to help with your evaluation and healthcare recommendations.    Effective January 1st, 2014, we ask that you re-schedule your appointment with our physicians should you arrive 10 or more minutes late for your appointment.  We strive to give you quality time with our providers, and arriving late affects you and other patients whose appointments are after yours.    Again, thank you for choosing [facility].  Our hope is that these requests will decrease the amount of time that you wait before being seen by our physicians.       _____________________________________________________________  Should you have questions after your visit to [facility], please contact our office at [phone] between the hours of 8:30 a.m. and 5:00 p.m.  Voicemails left after 4:30 p.m. will not be returned until the following business day.  For prescription refill requests, have your pharmacy contact our office with your prescription refill request.

## 2014-02-02 LAB — CANCER ANTIGEN 27.29: CA 27.29: 17 U/mL (ref 0–39)

## 2014-02-02 LAB — CEA: CEA: 1.1 ng/mL (ref 0.0–5.0)

## 2014-02-20 ENCOUNTER — Other Ambulatory Visit (HOSPITAL_COMMUNITY): Payer: Self-pay | Admitting: Hematology and Oncology

## 2014-02-20 ENCOUNTER — Ambulatory Visit (HOSPITAL_COMMUNITY)
Admission: RE | Admit: 2014-02-20 | Discharge: 2014-02-20 | Disposition: A | Discharge status: Home / Self Care | Payer: BC Managed Care – PPO | Source: Ambulatory Visit | Attending: Hematology and Oncology | Admitting: Hematology and Oncology

## 2014-02-20 DIAGNOSIS — Z1231 Encounter for screening mammogram for malignant neoplasm of breast: Secondary | ICD-10-CM | POA: Insufficient documentation

## 2014-02-20 DIAGNOSIS — C50919 Malignant neoplasm of unspecified site of unspecified female breast: Secondary | ICD-10-CM

## 2014-02-28 ENCOUNTER — Other Ambulatory Visit: Payer: Self-pay | Admitting: Family Medicine

## 2014-03-13 ENCOUNTER — Encounter: Payer: Self-pay | Admitting: Family Medicine

## 2014-03-13 ENCOUNTER — Ambulatory Visit (INDEPENDENT_AMBULATORY_CARE_PROVIDER_SITE_OTHER): Payer: BC Managed Care – PPO | Admitting: Family Medicine

## 2014-03-13 ENCOUNTER — Other Ambulatory Visit (HOSPITAL_COMMUNITY)
Admission: RE | Admit: 2014-03-13 | Discharge: 2014-03-13 | Disposition: A | Discharge status: Home / Self Care | Payer: BC Managed Care – PPO | Source: Ambulatory Visit | Attending: Family Medicine | Admitting: Family Medicine

## 2014-03-13 VITALS — BP 102/70 | HR 86 | Resp 16 | Ht 66.0 in | Wt 186.8 lb

## 2014-03-13 DIAGNOSIS — I10 Essential (primary) hypertension: Secondary | ICD-10-CM

## 2014-03-13 DIAGNOSIS — Z124 Encounter for screening for malignant neoplasm of cervix: Secondary | ICD-10-CM

## 2014-03-13 DIAGNOSIS — Z01419 Encounter for gynecological examination (general) (routine) without abnormal findings: Secondary | ICD-10-CM | POA: Insufficient documentation

## 2014-03-13 DIAGNOSIS — Z0000 Encounter for general adult medical examination without abnormal findings: Secondary | ICD-10-CM

## 2014-03-13 DIAGNOSIS — Z1151 Encounter for screening for human papillomavirus (HPV): Secondary | ICD-10-CM | POA: Insufficient documentation

## 2014-03-13 DIAGNOSIS — R7303 Prediabetes: Secondary | ICD-10-CM

## 2014-03-13 DIAGNOSIS — E785 Hyperlipidemia, unspecified: Secondary | ICD-10-CM

## 2014-03-13 DIAGNOSIS — Z1211 Encounter for screening for malignant neoplasm of colon: Secondary | ICD-10-CM

## 2014-03-13 LAB — POC HEMOCCULT BLD/STL (OFFICE/1-CARD/DIAGNOSTIC): Fecal Occult Blood, POC: NEGATIVE

## 2014-03-13 NOTE — Patient Instructions (Signed)
F/u in 4.5 month, call if you need me before please  No changes in medication  CONGRATS on weight loss, keep the good habits going, and I know you are looking forward to being able to walk regularly for health and exercise  Lipid, cmp and hBa1C today   hBA1C , lipid, cmp in 4.5 month fasting  Please work on cessation, you need to quit   Smoking Cessation, Tips for Success If you are ready to quit smoking, congratulations! You have chosen to help yourself be healthier. Cigarettes bring nicotine, tar, carbon monoxide, and other irritants into your body. Your lungs, heart, and blood vessels will be able to work better without these poisons. There are many different ways to quit smoking. Nicotine gum, nicotine patches, a nicotine inhaler, or nicotine nasal spray can help with physical craving. Hypnosis, support groups, and medicines help break the habit of smoking. WHAT THINGS CAN I DO TO MAKE QUITTING EASIER?  Here are some tips to help you quit for good:  Pick a date when you will quit smoking completely. Tell all of your friends and family about your plan to quit on that date.  Do not try to slowly cut down on the number of cigarettes you are smoking. Pick a quit date and quit smoking completely starting on that day.  Throw away all cigarettes.   Clean and remove all ashtrays from your home, work, and car.   On a card, write down your reasons for quitting. Carry the card with you and read it when you get the urge to smoke.   Cleanse your body of nicotine. Drink enough water and fluids to keep your urine clear or pale yellow. Do this after quitting to flush the nicotine from your body.   Learn to predict your moods. Do not let a bad situation be your excuse to have a cigarette. Some situations in your life might tempt you into wanting a cigarette.   Never have "just one" cigarette. It leads to wanting another and another. Remind yourself of your decision to quit.   Change habits  associated with smoking. If you smoked while driving or when feeling stressed, try other activities to replace smoking. Stand up when drinking your coffee. Brush your teeth after eating. Sit in a different chair when you read the paper. Avoid alcohol while trying to quit, and try to drink fewer caffeinated beverages. Alcohol and caffeine may urge you to smoke.   Avoid foods and drinks that can trigger a desire to smoke, such as sugary or spicy foods and alcohol.   Ask people who smoke not to smoke around you.   Have something planned to do right after eating or having a cup of coffee. For example, plan to take a walk or exercise.   Try a relaxation exercise to calm you down and decrease your stress. Remember, you may be tense and nervous for the first 2 weeks after you quit, but this will pass.   Find new activities to keep your hands busy. Play with a pen, coin, or rubber band. Doodle or draw things on paper.   Brush your teeth right after eating. This will help cut down on the craving for the taste of tobacco after meals. You can also try mouthwash.   Use oral substitutes in place of cigarettes. Try using lemon drops, carrots, cinnamon sticks, or chewing gum. Keep them handy so they are available when you have the urge to smoke.   When you have the  urge to smoke, try deep breathing.   Designate your home as a nonsmoking area.   If you are a heavy smoker, ask your health care provider about a prescription for nicotine chewing gum. It can ease your withdrawal from nicotine.   Reward yourself. Set aside the cigarette money you save and buy yourself something nice.   Look for support from others. Join a support group or smoking cessation program. Ask someone at home or at work to help you with your plan to quit smoking.   Always ask yourself, "Do I need this cigarette or is this just a reflex?" Tell yourself, "Today, I choose not to smoke," or "I do not want to smoke." You are  reminding yourself of your decision to quit.  Do not replace cigarette smoking with electronic cigarettes (commonly called e-cigarettes). The safety of e-cigarettes is unknown, and some may contain harmful chemicals.  If you relapse, do not give up! Plan ahead and think about what you will do the next time you get the urge to smoke.  HOW WILL I FEEL WHEN I QUIT SMOKING? You may have symptoms of withdrawal because your body is used to nicotine (the addictive substance in cigarettes). You may crave cigarettes, be irritable, feel very hungry, cough often, get headaches, or have difficulty concentrating. The withdrawal symptoms are only temporary. They are strongest when you first quit but will go away within 10 14 days. When withdrawal symptoms occur, stay in control. Think about your reasons for quitting. Remind yourself that these are signs that your body is healing and getting used to being without cigarettes. Remember that withdrawal symptoms are easier to treat than the major diseases that smoking can cause.  Even after the withdrawal is over, expect periodic urges to smoke. However, these cravings are generally short lived and will go away whether you smoke or not. Do not smoke!  WHAT RESOURCES ARE AVAILABLE TO HELP ME QUIT SMOKING? Your health care provider can direct you to community resources or hospitals for support, which may include:  Group support.  Education.  Hypnosis.  Therapy. Document Released: 06/18/2004 Document Revised: 07/11/2013 Document Reviewed: 03/08/2013 [facility] Patient Information 2014 [place], [place].

## 2014-03-14 ENCOUNTER — Encounter: Payer: Self-pay | Admitting: Family Medicine

## 2014-03-14 LAB — COMPREHENSIVE METABOLIC PANEL
ALT: 11 U/L (ref 0–35)
AST: 14 U/L (ref 0–37)
Albumin: 4.4 g/dL (ref 3.5–5.2)
Alkaline Phosphatase: 73 U/L (ref 39–117)
BUN: 13 mg/dL (ref 6–23)
CO2: 27 mEq/L (ref 19–32)
Calcium: 9.6 mg/dL (ref 8.4–10.5)
Chloride: 102 mEq/L (ref 96–112)
Creat: 0.81 mg/dL (ref 0.50–1.10)
Glucose, Bld: 89 mg/dL (ref 70–99)
Potassium: 4.5 mEq/L (ref 3.5–5.3)
Sodium: 140 mEq/L (ref 135–145)
Total Bilirubin: 0.7 mg/dL (ref 0.2–1.2)
Total Protein: 7 g/dL (ref 6.0–8.3)

## 2014-03-14 LAB — HEMOGLOBIN A1C
Hgb A1c MFr Bld: 5.9 % — ABNORMAL HIGH (ref <5.7)
Mean Plasma Glucose: 123 mg/dL — ABNORMAL HIGH (ref <117)

## 2014-03-14 LAB — LIPID PANEL
Cholesterol: 238 mg/dL — ABNORMAL HIGH (ref 0–200)
HDL: 46 mg/dL (ref >39)
LDL Cholesterol: 160 mg/dL — ABNORMAL HIGH (ref 0–99)
Total CHOL/HDL Ratio: 5.2 Ratio
Triglycerides: 162 mg/dL — ABNORMAL HIGH (ref <150)
VLDL: 32 mg/dL (ref 0–40)

## 2014-03-15 ENCOUNTER — Other Ambulatory Visit: Payer: Self-pay

## 2014-03-15 LAB — CYTOLOGY - PAP

## 2014-03-15 MED ORDER — TEMAZEPAM 30 MG PO CAPS: 30.0000 mg | ORAL_CAPSULE | Freq: Every evening | ORAL | Status: DC | PRN

## 2014-03-15 MED ORDER — TRIAMTERENE-HCTZ 37.5-25 MG PO CAPS: ORAL_CAPSULE | ORAL | Status: DC

## 2014-03-15 MED ORDER — TRAZODONE HCL 100 MG PO TABS: ORAL_TABLET | ORAL | Status: DC

## 2014-03-15 MED ORDER — METFORMIN HCL ER 500 MG PO TB24: 500.0000 mg | ORAL_TABLET | Freq: Every day | ORAL | Status: DC

## 2014-03-15 MED ORDER — VENLAFAXINE HCL ER 75 MG PO CP24: 75.0000 mg | ORAL_CAPSULE | Freq: Every day | ORAL | Status: DC

## 2014-03-15 MED ORDER — METOPROLOL TARTRATE 25 MG PO TABS: 37.5000 mg | ORAL_TABLET | Freq: Two times a day (BID) | ORAL | Status: DC

## 2014-03-15 MED ORDER — OMEPRAZOLE 20 MG PO CPDR: DELAYED_RELEASE_CAPSULE | ORAL | Status: DC

## 2014-03-15 MED ORDER — LORAZEPAM 1 MG PO TABS: 1.0000 mg | ORAL_TABLET | Freq: Three times a day (TID) | ORAL | Status: DC | PRN

## 2014-03-15 NOTE — Assessment & Plan Note (Signed)
Annual exam as documented. Counseling done  re healthy lifestyle involving commitment to 150 minutes exercise per week, heart healthy diet, and attaining healthy weight.The importance of adequate sleep also discussed.This is improved with the help of medication Patient counseled for approximately 5 minutes regarding the health risks of ongoing nicotine use, specifically all types of cancer, heart disease, stroke and respiratory failure. The options available for help with cessation ,the behavioral changes to assist the process, and the option to either gradully reduce usage  Or abruptly stop.is also discussed. Pt is also encouraged to set specific goals in number of cigarettes used daily, as well as to set a quit date.   Changes in health habits are decided on by the patient with goals and time frames  set for achieving them. Immunization and cancer screening needs are specifically addressed at this visit.

## 2014-03-15 NOTE — Progress Notes (Signed)
   Subjective:    Patient ID: Annette [name], female    DOB: 1961/07/24, 53 y.o.   MRN: [id number]  HPI Patient is in for annual physical exam. No other health concerns are expressed or addressed at the visit. Has been successful with weight loss with the use of HCg and has kept the weight off for the past 3 month, she is applauded on this. Plans to commit to regualr exercise as soon as she is out of work in the next 2 weeks, no more hormone treatment for her, with which I totoally agree, phentermine was of no benefit Cigarettes continue to be a challenge, she will work on this also, no quit date set at this time    Review of Systems See HPI     Objective:   Physical Exam Pleasant well nourished female, alert and oriented x 3, in no cardio-pulmonary distress. Afebrile. HEENT No facial trauma or asymetry. Sinuses non tender.  EOMI, PERTL, fundoscopic exam  no hemorhage or exudate.  External ears normal, tympanic membranes clear. Oropharynx moist, no exudate, fairly  good dentition. Neck: supple, no adenopathy,JVD or thyromegaly.No bruits.  Chest: Clear to ascultation bilaterally.No crackles or wheezes. Non tender to palpation  Breast: No asymetry,no masses or lumps. No tenderness. No nipple discharge or inversion. No axillary or supraclavicular adenopathy  Cardiovascular system; Heart sounds normal,  S1 and  S2 ,no S3.  No murmur, or thrill. Apical beat not displaced Peripheral pulses normal.  Abdomen: Soft, non tender, no organomegaly or masses. No bruits. Bowel sounds normal. No guarding, tenderness or rebound.  Rectal:  Normal sphincter tone. No mass.No rectal masses.  Guaiac negative stool.  GU: External genitalia normal female genitalia , female distribution of hair. No lesions. Urethral meatus normal in size, no  Prolapse, no lesions visibly  Present. Bladder non tender. Vagina pink and moist , with no visible lesions , discharge present . Adequate  pelvic support no  cystocele or rectocele noted  Uterus absent, no adnexal masses, no c adnexal tenderness.   Musculoskeletal exam: Full ROM of spine, hips , shoulders and knees. No deformity ,swelling or crepitus noted. No muscle wasting or atrophy.   Neurologic: Cranial nerves 2 to 12 intact. Power, tone ,sensation and reflexes normal throughout. No disturbance in gait. No tremor.  Skin: Intact, no ulceration, erythema , scaling or rash noted. Pigmentation normal throughout  Psych; Normal mood and affect. Judgement and concentration normal        Assessment & Plan:  Routine general medical examination at a health care facility Annual exam as documented. Counseling done  re healthy lifestyle involving commitment to 150 minutes exercise per week, heart healthy diet, and attaining healthy weight.The importance of adequate sleep also discussed.This is improved with the help of medication Patient counseled for approximately 5 minutes regarding the health risks of ongoing nicotine use, specifically all types of cancer, heart disease, stroke and respiratory failure. The options available for help with cessation ,the behavioral changes to assist the process, and the option to either gradully reduce usage  Or abruptly stop.is also discussed. Pt is also encouraged to set specific goals in number of cigarettes used daily, as well as to set a quit date.   Changes in health habits are decided on by the patient with goals and time frames  set for achieving them. Immunization and cancer screening needs are specifically addressed at this visit.

## 2014-04-11 ENCOUNTER — Other Ambulatory Visit: Payer: Self-pay | Admitting: Family Medicine

## 2014-04-12 ENCOUNTER — Other Ambulatory Visit: Payer: Self-pay

## 2014-04-12 MED ORDER — NAPROXEN 500 MG PO TABS: 500.0000 mg | ORAL_TABLET | Freq: Two times a day (BID) | ORAL | Status: DC

## 2014-05-01 ENCOUNTER — Other Ambulatory Visit: Payer: Self-pay

## 2014-05-01 MED ORDER — TRIAMTERENE-HCTZ 37.5-25 MG PO CAPS: ORAL_CAPSULE | ORAL | Status: DC

## 2014-05-10 ENCOUNTER — Other Ambulatory Visit: Payer: Self-pay | Admitting: Family Medicine

## 2014-05-10 MED ORDER — TEMAZEPAM 30 MG PO CAPS: 30.0000 mg | ORAL_CAPSULE | Freq: Every evening | ORAL | Status: DC | PRN

## 2014-05-13 ENCOUNTER — Other Ambulatory Visit: Payer: Self-pay

## 2014-07-13 LAB — HEMOGLOBIN A1C
Hgb A1c MFr Bld: 5.9 % — ABNORMAL HIGH (ref <5.7)
Mean Plasma Glucose: 123 mg/dL — ABNORMAL HIGH (ref <117)

## 2014-07-13 LAB — COMPREHENSIVE METABOLIC PANEL
ALT: 12 U/L (ref 0–35)
AST: 14 U/L (ref 0–37)
Albumin: 3.8 g/dL (ref 3.5–5.2)
Alkaline Phosphatase: 65 U/L (ref 39–117)
BUN: 14 mg/dL (ref 6–23)
CO2: 29 mEq/L (ref 19–32)
Calcium: 9.2 mg/dL (ref 8.4–10.5)
Chloride: 104 mEq/L (ref 96–112)
Creat: 0.83 mg/dL (ref 0.50–1.10)
Glucose, Bld: 106 mg/dL — ABNORMAL HIGH (ref 70–99)
Potassium: 4 mEq/L (ref 3.5–5.3)
Sodium: 141 mEq/L (ref 135–145)
Total Bilirubin: 0.7 mg/dL (ref 0.2–1.2)
Total Protein: 6.7 g/dL (ref 6.0–8.3)

## 2014-07-13 LAB — LIPID PANEL
Cholesterol: 255 mg/dL — ABNORMAL HIGH (ref 0–200)
HDL: 42 mg/dL (ref >39)
LDL Cholesterol: 184 mg/dL — ABNORMAL HIGH (ref 0–99)
Total CHOL/HDL Ratio: 6.1 Ratio
Triglycerides: 146 mg/dL (ref <150)
VLDL: 29 mg/dL (ref 0–40)

## 2014-07-15 ENCOUNTER — Ambulatory Visit (INDEPENDENT_AMBULATORY_CARE_PROVIDER_SITE_OTHER): Payer: BC Managed Care – PPO | Admitting: Family Medicine

## 2014-07-15 ENCOUNTER — Encounter: Payer: Self-pay | Admitting: Family Medicine

## 2014-07-15 VITALS — BP 110/78 | HR 86 | Resp 16 | Ht 66.0 in | Wt 192.4 lb

## 2014-07-15 DIAGNOSIS — E039 Hypothyroidism, unspecified: Secondary | ICD-10-CM

## 2014-07-15 DIAGNOSIS — E785 Hyperlipidemia, unspecified: Secondary | ICD-10-CM

## 2014-07-15 DIAGNOSIS — R7303 Prediabetes: Secondary | ICD-10-CM

## 2014-07-15 DIAGNOSIS — I10 Essential (primary) hypertension: Secondary | ICD-10-CM

## 2014-07-15 DIAGNOSIS — Z23 Encounter for immunization: Secondary | ICD-10-CM

## 2014-07-15 DIAGNOSIS — E669 Obesity, unspecified: Secondary | ICD-10-CM

## 2014-07-15 DIAGNOSIS — R7309 Other abnormal glucose: Secondary | ICD-10-CM

## 2014-07-15 DIAGNOSIS — F17200 Nicotine dependence, unspecified, uncomplicated: Secondary | ICD-10-CM

## 2014-07-15 DIAGNOSIS — E66811 Obesity, class 1: Secondary | ICD-10-CM

## 2014-07-15 DIAGNOSIS — G4700 Insomnia, unspecified: Secondary | ICD-10-CM

## 2014-07-15 MED ORDER — TEMAZEPAM 15 MG PO CAPS: 15.0000 mg | ORAL_CAPSULE | Freq: Every evening | ORAL | Status: DC | PRN

## 2014-07-15 MED ORDER — EZETIMIBE 10 MG PO TABS: 10.0000 mg | ORAL_TABLET | Freq: Every day | ORAL | Status: DC

## 2014-07-15 NOTE — Assessment & Plan Note (Deleted)
Deteriorated Hyperlipidemia:Low fat diet discussed and encouraged.  Start zetia Updated lab needed at/ before next visit.

## 2014-07-15 NOTE — Assessment & Plan Note (Signed)
Trying to quit, unable to set quit date Patient counseled for approximately 5 minutes regarding the health risks of ongoing nicotine use, specifically all types of cancer, heart disease, stroke and respiratory failure. The options available for help with cessation ,the behavioral changes to assist the process, and the option to either gradully reduce usage  Or abruptly stop.is also discussed. Pt is also encouraged to set specific goals in number of cigarettes used daily, as well as to set a quit date.

## 2014-07-15 NOTE — Assessment & Plan Note (Signed)
Vaccine administered at visit.  

## 2014-07-15 NOTE — Assessment & Plan Note (Signed)
Controlled, no change in medication DASH diet and commitment to daily physical activity for a minimum of 30 minutes discussed and encouraged, as a part of hypertension management. The importance of attaining a healthy weight is also discussed.  

## 2014-07-15 NOTE — Patient Instructions (Signed)
F/u in 3.5 month, call if you need me before  New for cholesterol is zetia one at bedtime, you will get coupon  Blood sugar avg is the same , regular exercise will help this to improve  Flu vaccine today \  Pls start zumba tapes  Commit to 9 ciggs per day starting next week, and try to reduce every 1 to 2 weeks, quitting is your best health option  Fasting lipid, cmp and hBa1C in 3.5 month  Hopefully your home situation will improve and you will find work  Reduced restoril dose sent in as you are now sleeping for 12 hrs per day!

## 2014-07-15 NOTE — Assessment & Plan Note (Signed)
Deteriorated. Patient re-educated about  the importance of commitment to a  minimum of 150 minutes of exercise per week. The importance of healthy food choices with portion control discussed. Encouraged to start a food diary, count calories and to consider  joining a support group. Sample diet sheets offered. Goals set by the patient for the next several months.    

## 2014-07-15 NOTE — Assessment & Plan Note (Addendum)
Sleeps 12 hrs per day med dose decreased Sleep hygiene reviewed and written information offered also. Prescription sent for  medication needed.

## 2014-07-15 NOTE — Assessment & Plan Note (Signed)
Improved.Patient advised to reduce carb and sweets, commit to regular physical activity, take meds as prescribed,, and attempt to lose weight, to improve blood sugar control.and prevent development of diabetes Updated lab needed at/ before next visit.

## 2014-07-15 NOTE — Progress Notes (Signed)
Subjective:    Patient ID: Annette [name], female    DOB: 1961-02-21, 53 y.o.   MRN: [id number]  HPI The PT is here for follow up and re-evaluation of chronic medical conditions, medication management and review of any available recent lab and radiology data.  Preventive health is updated, specifically  Cancer screening and Immunization.    The PT denies any adverse reactions to current medications since the last visit. except states she is now sleeping for 12 hours on current dose of restoril so dose reduced. Increased financial and physical stress, unemployed and her daughter in Sports coach and grand children (2) temporarily are living with her and spouse, increased financial and physical stress, but happy to be able to help There are no new concerns. Still trying to quit smoking, increased challenge now. Frustrated with weight and labs des[pite  "excellent diet" lipids higher, afraid of possible s/e of statin therapy has seen father with adverse s/e , willing to take med other than statin for cholesterol     Review of Systems See HPI Denies recent fever or chills. Denies sinus pressure, nasal congestion, ear pain or sore throat. Denies chest congestion, productive cough or wheezing. Denies chest pains, palpitations and leg swelling Denies abdominal pain, nausea, vomiting,diarrhea or constipation.   Denies dysuria, frequency, hesitancy or incontinence. Denies joint pain, swelling and limitation in mobility. Denies headaches, seizures, numbness, or tingling. Denies depression, c/o increased anxiety , now over sleeping Denies skin break down or rash.        Objective:   Physical Exam BP 110/78  Pulse 86  Resp 16  Ht 5\' 6"  (1.676 m)  Wt 192 lb 6.4 oz (87.272 kg)  BMI 31.07 kg/m2  SpO2 95% Patient alert and oriented and in no cardiopulmonary distress.  HEENT: No facial asymmetry, EOMI,   oropharynx pink and moist.  Neck supple no JVD, no mass.  Chest: Clear to auscultation  bilaterally.  CVS: S1, S2 no murmurs, no S3.Regular rate.  ABD: Soft non tender.   Ext: No edema  MS: Adequate ROM spine, shoulders, hips and knees.  Skin: Intact, no ulcerations or rash noted.Calluses on soles   Psych: Good eye contact, normal affect. Memory intact not anxious or depressed appearing.  CNS: CN 2-12 intact, power,  normal throughout.Reduced sensation on soles        Assessment & Plan:  Insomnia Sleeps 12 hrs per day med dose decreased Sleep hygiene reviewed and written information offered also. Prescription sent for  medication needed.   Hyperlipidemia LDL goal <100 Hyperlipidemia:Low fat diet discussed and encouraged.  Updated lab needed at/ before next visit.  Start zetia   HTN, goal below 130/80 Controlled, no change in medication DASH diet and commitment to daily physical activity for a minimum of 30 minutes discussed and encouraged, as a part of hypertension management. The importance of attaining a healthy weight is also discussed.   Nicotine dependence Trying to quit, unable to set quit date Patient counseled for approximately 5 minutes regarding the health risks of ongoing nicotine use, specifically all types of cancer, heart disease, stroke and respiratory failure. The options available for help with cessation ,the behavioral changes to assist the process, and the option to either gradully reduce usage  Or abruptly stop.is also discussed. Pt is also encouraged to set specific goals in number of cigarettes used daily, as well as to set a quit date.   Obesity, Class I, BMI 30.0-34.9 (see actual BMI) Deteriorated. Patient re-educated about  the importance of commitment to a  minimum of 150 minutes of exercise per week. The importance of healthy food choices with portion control discussed. Encouraged to start a food diary, count calories and to consider  joining a support group. Sample diet sheets offered. Goals set by the patient for the  next several months.     Prediabetes Improved.Patient advised to reduce carb and sweets, commit to regular physical activity, take meds as prescribed,, and attempt to lose weight, to improve blood sugar control.and prevent development of diabetes Updated lab needed at/ before next visit.   Need for prophylactic vaccination and inoculation against influenza Vaccine administered at visit.

## 2014-07-15 NOTE — Assessment & Plan Note (Addendum)
Hyperlipidemia:Low fat diet discussed and encouraged.  Updated lab needed at/ before next visit.  Start zetia

## 2014-07-16 DIAGNOSIS — Z23 Encounter for immunization: Secondary | ICD-10-CM

## 2014-07-29 ENCOUNTER — Other Ambulatory Visit: Payer: Self-pay | Admitting: Family Medicine

## 2014-08-15 ENCOUNTER — Encounter (INDEPENDENT_AMBULATORY_CARE_PROVIDER_SITE_OTHER): Payer: Self-pay | Admitting: *Deleted

## 2014-08-22 ENCOUNTER — Encounter (INDEPENDENT_AMBULATORY_CARE_PROVIDER_SITE_OTHER): Payer: Self-pay | Admitting: *Deleted

## 2014-10-15 ENCOUNTER — Ambulatory Visit (INDEPENDENT_AMBULATORY_CARE_PROVIDER_SITE_OTHER): Payer: BC Managed Care – PPO | Admitting: Internal Medicine

## 2014-10-16 ENCOUNTER — Ambulatory Visit (INDEPENDENT_AMBULATORY_CARE_PROVIDER_SITE_OTHER): Payer: BLUE CROSS/BLUE SHIELD | Admitting: Internal Medicine

## 2014-10-16 ENCOUNTER — Encounter (INDEPENDENT_AMBULATORY_CARE_PROVIDER_SITE_OTHER): Payer: Self-pay | Admitting: Internal Medicine

## 2014-10-16 VITALS — BP 90/54 | HR 64 | Temp 97.9°F | Ht 66.0 in | Wt 208.0 lb

## 2014-10-16 DIAGNOSIS — K219 Gastro-esophageal reflux disease without esophagitis: Secondary | ICD-10-CM

## 2014-10-16 DIAGNOSIS — K589 Irritable bowel syndrome without diarrhea: Secondary | ICD-10-CM

## 2014-10-16 NOTE — Progress Notes (Signed)
Subjective:    Patient ID: Annette [name], female    DOB: 12/16/1960, 54 y.o.   MRN: [id number]  HPI Here today for f/u of her GERD and IBS. Last seen in January 2015. She tells me she is doing good. Takes Omeprazole BID for GERD which controls her symptoms. No abdominal pain.  Usually a BM once a day. No melena or BRRB. Exercises regularly. Appetite is good. She has gained 10 pounds since her last visit in December. She is unemployed at this time.     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 She had this surgery late last year at [facility] in May of 2013.  She had colonoscopy in October 2010. Family history is negative for CRC.   Review of Systems Past Medical History  Diagnosis Date  . Sleep apnea 2010    Problems with CPAP  . GERD (gastroesophageal reflux disease)   . Menieres disease     Controlled with triamterene   . Pituitary insufficiency   . IBS (irritable bowel syndrome)   . Depression   . Obesity   . Migraines   . Palpitations   . Bruises easily   . Breast cancer 2007    History of  XRT, onTamoxifen  . Diabetes mellitus     prediabetic  . Hypothyroidism     following chemo amnd radiaition for breast cancer, needed replacement short term  . Hypothyroidism (acquired)     replaced x 1 year    Past Surgical History  Procedure Laterality Date  . Thyroidectomy, partial  11/05/2010    Benign disease  . Appendectomy  06/12/08  . Cholecystectomy  1985  . Breast surgery  2007    Left lumpectomy  . Breast reconstruction      Left reconstructive  . Breast surgery      Mammosite - right side  . Abdominal hysterectomy  1998    Benign, fibroids  . Adrenalectomy  02/16/2012    [facility], splemic trauma, 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  . 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 Penicillins Swelling    Of face and  tongue.  . Statins Other (See Comments)    Major concerns re safety    Current Outpatient Prescriptions on File Prior to Visit  Medication Sig Dispense Refill  . clotrimazole-betamethasone (LOTRISONE) cream Apply topically as needed.    Marland Kitchen LORazepam (ATIVAN) 1 MG tablet Take 1 tablet (1 mg total) by mouth every 8 (eight) hours as needed for anxiety (and/or nausea). 30 tablet 1  . metFORMIN (GLUCOPHAGE-XR) 500 MG 24 hr tablet Take 1 tablet (500 mg total) by mouth daily. 90 tablet 1  . metoprolol tartrate (LOPRESSOR) 25 MG tablet TAKE 1 & 1/2 TABLETS (37.5 MG TOTAL) BY MOUTH TWO TIMES DAILY. 180 tablet 1  . naproxen (NAPROSYN) 500 MG tablet Take 1 tablet (500 mg total) by mouth 2 (two) times daily with a meal. (Patient taking differently: Take 500 mg by mouth as needed. ) 40 tablet 3  . omeprazole (PRILOSEC) 20 MG capsule TAKE ONE CAPSULE BY MOUTH TWO TIMES A DAY INSTEAD OF PROTONIX. 180 capsule 1  . temazepam (RESTORIL) 15 MG capsule Take 1 capsule (15 mg total) by mouth at bedtime as needed for sleep. 30 capsule 5  . traZODone (DESYREL) 100 MG tablet TAKE 1 TABLET (100 MG TOTAL) BY MOUTH AT BEDTIME.  90 tablet 1  . triamterene-hydrochlorothiazide (DYAZIDE) 37.5-25 MG per capsule TAKE ONE CAPSULE BY MOUTH DAILY 90 capsule 1  . venlafaxine XR (EFFEXOR-XR) 75 MG 24 hr capsule Take 1 capsule (75 mg total) by mouth daily. 90 capsule 1   No current facility-administered medications on file prior to visit.        Objective:   Physical Exam  Filed Vitals:   10/16/14 1527  Height: 5\' 6"  (1.676 m)  Weight: 208 lb (94.348 kg)    Alert and oriented. Skin warm and dry. Oral mucosa is moist.   . Sclera anicteric, conjunctivae is pink. Thyroid not enlarged. No cervical lymphadenopathy. Lungs clear. Heart regular rate and rhythm.  Abdomen is soft. Bowel sounds are positive. No hepatomegaly. No abdominal masses felt. No tenderness.  No edema to lower extremities.         Assessment & Plan:  GERD  cotnrolled at this time. Takes Omeprazole BID. IBS. OV in 1 year.

## 2014-10-16 NOTE — Patient Instructions (Signed)
OV in 1 year.  

## 2014-10-18 ENCOUNTER — Other Ambulatory Visit: Payer: Self-pay | Admitting: Family Medicine

## 2014-11-16 LAB — COMPREHENSIVE METABOLIC PANEL
ALT: 14 U/L (ref 0–35)
AST: 16 U/L (ref 0–37)
Albumin: 3.9 g/dL (ref 3.5–5.2)
Alkaline Phosphatase: 65 U/L (ref 39–117)
BUN: 13 mg/dL (ref 6–23)
CO2: 29 mEq/L (ref 19–32)
Calcium: 9.6 mg/dL (ref 8.4–10.5)
Chloride: 105 mEq/L (ref 96–112)
Creat: 0.74 mg/dL (ref 0.50–1.10)
Glucose, Bld: 126 mg/dL — ABNORMAL HIGH (ref 70–99)
Potassium: 3.8 mEq/L (ref 3.5–5.3)
Sodium: 142 mEq/L (ref 135–145)
Total Bilirubin: 0.6 mg/dL (ref 0.2–1.2)
Total Protein: 6.8 g/dL (ref 6.0–8.3)

## 2014-11-16 LAB — LIPID PANEL
Cholesterol: 261 mg/dL — ABNORMAL HIGH (ref 0–200)
HDL: 52 mg/dL (ref >39)
LDL Cholesterol: 184 mg/dL — ABNORMAL HIGH (ref 0–99)
Total CHOL/HDL Ratio: 5 Ratio
Triglycerides: 125 mg/dL (ref <150)
VLDL: 25 mg/dL (ref 0–40)

## 2014-11-16 LAB — VITAMIN D 25 HYDROXY (VIT D DEFICIENCY, FRACTURES): Vit D, 25-Hydroxy: 20 ng/mL — ABNORMAL LOW (ref 30–100)

## 2014-11-16 LAB — HEMOGLOBIN A1C
Hgb A1c MFr Bld: 6 % — ABNORMAL HIGH (ref <5.7)
Mean Plasma Glucose: 126 mg/dL — ABNORMAL HIGH (ref <117)

## 2014-11-19 ENCOUNTER — Other Ambulatory Visit: Payer: Self-pay | Admitting: Family Medicine

## 2014-11-20 ENCOUNTER — Other Ambulatory Visit: Payer: Self-pay

## 2014-11-20 MED ORDER — TRIAMTERENE-HCTZ 37.5-25 MG PO CAPS
1.0000 | ORAL_CAPSULE | Freq: Every day | ORAL | Status: DC
Start: 2014-11-20 — End: 2015-02-10

## 2014-11-21 ENCOUNTER — Encounter: Payer: Self-pay | Admitting: Family Medicine

## 2014-11-21 ENCOUNTER — Ambulatory Visit (INDEPENDENT_AMBULATORY_CARE_PROVIDER_SITE_OTHER): Payer: BLUE CROSS/BLUE SHIELD | Admitting: Family Medicine

## 2014-11-21 VITALS — BP 102/60 | HR 68 | Resp 18 | Ht 66.0 in | Wt 212.0 lb

## 2014-11-21 DIAGNOSIS — F411 Generalized anxiety disorder: Secondary | ICD-10-CM

## 2014-11-21 DIAGNOSIS — F17208 Nicotine dependence, unspecified, with other nicotine-induced disorders: Secondary | ICD-10-CM

## 2014-11-21 DIAGNOSIS — E669 Obesity, unspecified: Secondary | ICD-10-CM

## 2014-11-21 DIAGNOSIS — G4730 Sleep apnea, unspecified: Secondary | ICD-10-CM

## 2014-11-21 DIAGNOSIS — E66811 Obesity, class 1: Secondary | ICD-10-CM

## 2014-11-21 DIAGNOSIS — E039 Hypothyroidism, unspecified: Secondary | ICD-10-CM

## 2014-11-21 DIAGNOSIS — K219 Gastro-esophageal reflux disease without esophagitis: Secondary | ICD-10-CM

## 2014-11-21 DIAGNOSIS — R7303 Prediabetes: Secondary | ICD-10-CM

## 2014-11-21 DIAGNOSIS — E785 Hyperlipidemia, unspecified: Secondary | ICD-10-CM

## 2014-11-21 DIAGNOSIS — E8881 Metabolic syndrome: Secondary | ICD-10-CM

## 2014-11-21 DIAGNOSIS — G4700 Insomnia, unspecified: Secondary | ICD-10-CM

## 2014-11-21 DIAGNOSIS — R7309 Other abnormal glucose: Secondary | ICD-10-CM

## 2014-11-21 DIAGNOSIS — I10 Essential (primary) hypertension: Secondary | ICD-10-CM

## 2014-11-21 DIAGNOSIS — E559 Vitamin D deficiency, unspecified: Secondary | ICD-10-CM

## 2014-11-21 MED ORDER — TEMAZEPAM 15 MG PO CAPS: 15.0000 mg | ORAL_CAPSULE | Freq: Every evening | ORAL | Status: DC | PRN

## 2014-11-21 MED ORDER — TRAZODONE HCL 100 MG PO TABS: ORAL_TABLET | ORAL | Status: DC

## 2014-11-21 NOTE — Patient Instructions (Addendum)
F/u in 4 month with rectal exam, call if you need me before  You are referred to nutrition educator  Focus on eating 'natural foods " over 90 % of the time  Drink 64 ounces water daily   Commit to regular exercise  Use the QuIT NOW number for help with stopping smoking  If you decide on a date to stop cigarettes and use the patches , start with the 14 mg patch for 3 weeks , then reduce to the 7 mg patch  START OTC vit D 3 800 IU one daily  Fasting lipid,cmp , HBA1C, cBCand TSH in 4 month

## 2014-11-21 NOTE — Progress Notes (Signed)
Subjective:    Patient ID: Annette [name], female    DOB: Jan 17, 1961, 54 y.o.   MRN: [id number]  HPI The PT is here for follow up and re-evaluation of chronic medical conditions, medication management and review of any available recent lab and radiology data.  Preventive health is updated, specifically  Cancer screening and Immunization.   ed. The PT denies any adverse reactions to current medications since the last visit.  There are no new concerns. Still has her son and family living with her which is emotionally and financially stessful but expects relief in the near future. Frustrated with weight gain and deterioration in lbs despite her peceived excellent eating habiits       Review of Systems See HPI Denies recent fever or chills. Denies sinus pressure, nasal congestion, ear pain or sore throat. Denies chest congestion, productive cough or wheezing. Denies chest pains, palpitations and leg swelling Denies abdominal pain, nausea, vomiting,diarrhea or constipation.   Denies dysuria, frequency, hesitancy or incontinence. Denies joint pain, swelling and limitation in mobility. Denies headaches, seizures, numbness, or tingling. Denies skin break down or rash.        Objective:   Physical Exam BP 102/60 mmHg  Pulse 68  Resp 18  Ht 5\' 6"  (1.676 m)  Wt 212 lb (96.163 kg)  BMI 34.23 kg/m2  SpO2 94% Patient alert and oriented and in no cardiopulmonary distress.  HEENT: No facial asymmetry, EOMI,   oropharynx pink and moist.  Neck supple no JVD, no mass.  Chest: Clear to auscultation bilaterally.decreased air entry bilaterally though adequate  CVS: S1, S2 no murmurs, no S3.Regular rate.  ABD: Soft non tender.   Ext: No edema  MS: Adequate ROM spine, shoulders, hips and knees.  Skin: Intact, no ulcerations or rash noted.  Psych: Good eye contact, normal affect. Memory intact not anxious or depressed appearing.  CNS: CN 2-12 intact, power,  normal  throughout.no focal deficits noted.        Assessment & Plan:  HTN, goal below 130/80 Controlled, no change in medication DASH diet and commitment to daily physical activity for a minimum of 30 minutes discussed and encouraged, as a part of hypertension management. The importance of attaining a healthy weight is also discussed.    GERD (gastroesophageal reflux disease) Controlled, no change in medication    GAD (generalized anxiety disorder) Increased due to home stress however, workin on the situation and expects this to improve   Insomnia Sleep hygiene reviewed and written information offered also. Prescription sent for  medication needed.    Nicotine dependence .unchaned Patient counseled for approximately 5 minutes regarding the health risks of ongoing nicotine use, specifically all types of cancer, heart disease, stroke and respiratory failure. The options available for help with cessation ,the behavioral changes to assist the process, and the option to either gradully reduce usage  Or abruptly stop.is also discussed. Pt is also encouraged to set specific goals in number of cigarettes used daily, as well as to set a quit date.    Sleep apnea Reports compliance    Metabolic syndrome X The increased risk of cardiovascular disease associated with this diagnosis, and the need to consistently work on lifestyle to change this is discussed. Following  a  heart healthy diet ,commitment to 30 minutes of exercise at least 5 days per week, as well as control of blood sugar and cholesterol , and achieving a healthy weight are all the areas to be addressed .  Hyperlipidemia LDL goal <100 Deteriorated Hyperlipidemia:Low fat diet discussed and encouraged.  Updated lab needed at/ before next visit. Referred to dietitian, ointolerant of all lipid lowering meds and reports low fat diet   Prediabetes Unchanged Patient educated about the importance of limiting   Carbohydrate intake , the need to commit to daily physical activity for a minimum of 30 minutes , and to commit weight loss. The fact that changes in all these areas will reduce or eliminate all together the development of diabetes is stressed.   Updated lab needed at/ before next visit.    Obesity, Class I, BMI 30.0-34.9 (see actual BMI) Deteriorated. Patient re-educated about  the importance of commitment to a  minimum of 150 minutes of exercise per week. The importance of healthy food choices with portion control discussed. Encouraged to start a food diary, count calories and to consider  joining a support group. Sample diet sheets offered. Goals set by the patient for the next several months.      Vitamin D deficiency Needs weekly supplement for 6 months  minimal

## 2014-12-10 ENCOUNTER — Telehealth: Payer: Self-pay | Admitting: Family Medicine

## 2014-12-10 DIAGNOSIS — E669 Obesity, unspecified: Secondary | ICD-10-CM | POA: Insufficient documentation

## 2014-12-10 MED ORDER — VITAMIN D (ERGOCALCIFEROL) 1.25 MG (50000 UNIT) PO CAPS: 50000.0000 [IU] | ORAL_CAPSULE | ORAL | Status: DC

## 2014-12-10 NOTE — Telephone Encounter (Signed)
Called and left message for patient to return call.  Noted that she does have an appointment with diabetic educator.

## 2014-12-10 NOTE — Assessment & Plan Note (Signed)
Increased due to home stress however, workin on the situation and expects this to improve

## 2014-12-10 NOTE — Assessment & Plan Note (Signed)
.  unchaned Patient counseled for approximately 5 minutes regarding the health risks of ongoing nicotine use, specifically all types of cancer, heart disease, stroke and respiratory failure. The options available for help with cessation ,the behavioral changes to assist the process, and the option to either gradully reduce usage  Or abruptly stop.is also discussed. Pt is also encouraged to set specific goals in number of cigarettes used daily, as well as to set a quit date.

## 2014-12-10 NOTE — Assessment & Plan Note (Signed)
Deteriorated. Patient re-educated about  the importance of commitment to a  minimum of 150 minutes of exercise per week. The importance of healthy food choices with portion control discussed. Encouraged to start a food diary, count calories and to consider  joining a support group. Sample diet sheets offered. Goals set by the patient for the next several months.    

## 2014-12-10 NOTE — Addendum Note (Signed)
Addended by: Denman George B on: 12/10/2014 02:18 PM   Modules accepted: Orders

## 2014-12-10 NOTE — Telephone Encounter (Signed)
Patient aware and med sent to pharmacy.  

## 2014-12-10 NOTE — Assessment & Plan Note (Signed)
Controlled, no change in medication DASH diet and commitment to daily physical activity for a minimum of 30 minutes discussed and encouraged, as a part of hypertension management. The importance of attaining a healthy weight is also discussed.  

## 2014-12-10 NOTE — Telephone Encounter (Signed)
pls contact pt, explain that at last visit low Vit D level was not addressed so am following up  Please  advise vit D is low, and erx Vit D3 50,000 IU once weekly #4 refill 5, and let pt know .  Pls also check that she has an appt sched with diabetic ed and that she i encouraged Lendon Collar chanesshe is making with food choices and carb counting

## 2014-12-10 NOTE — Assessment & Plan Note (Signed)
Sleep hygiene reviewed and written information offered also. Prescription sent for  medication needed.  

## 2014-12-10 NOTE — Assessment & Plan Note (Signed)
Deteriorated Hyperlipidemia:Low fat diet discussed and encouraged.  Updated lab needed at/ before next visit. Referred to dietitian, ointolerant of all lipid lowering meds and reports low fat diet

## 2014-12-10 NOTE — Assessment & Plan Note (Signed)
Controlled, no change in medication  

## 2014-12-10 NOTE — Assessment & Plan Note (Signed)
The increased risk of cardiovascular disease associated with this diagnosis, and the need to consistently work on lifestyle to change this is discussed. Following  a  heart healthy diet ,commitment to 30 minutes of exercise at least 5 days per week, as well as control of blood sugar and cholesterol , and achieving a healthy weight are all the areas to be addressed .  

## 2014-12-10 NOTE — Assessment & Plan Note (Signed)
Reports compliance

## 2014-12-10 NOTE — Assessment & Plan Note (Signed)
Needs weekly supplement for 6 months  minimal

## 2014-12-10 NOTE — Assessment & Plan Note (Signed)
Unchanged Patient educated about the importance of limiting  Carbohydrate intake , the need to commit to daily physical activity for a minimum of 30 minutes , and to commit weight loss. The fact that changes in all these areas will reduce or eliminate all together the development of diabetes is stressed.   Updated lab needed at/ before next visit.

## 2014-12-18 ENCOUNTER — Other Ambulatory Visit: Payer: Self-pay | Admitting: Family Medicine

## 2014-12-23 ENCOUNTER — Telehealth: Payer: Self-pay | Admitting: Family Medicine

## 2014-12-24 NOTE — Telephone Encounter (Signed)
Med refilled 3/17

## 2015-01-06 ENCOUNTER — Encounter: Payer: Self-pay | Admitting: Family Medicine

## 2015-01-13 ENCOUNTER — Other Ambulatory Visit: Payer: Self-pay

## 2015-01-13 ENCOUNTER — Telehealth: Payer: Self-pay | Admitting: Family Medicine

## 2015-01-13 DIAGNOSIS — G4700 Insomnia, unspecified: Secondary | ICD-10-CM

## 2015-01-13 MED ORDER — TEMAZEPAM 15 MG PO CAPS: 15.0000 mg | ORAL_CAPSULE | Freq: Every evening | ORAL | Status: DC | PRN

## 2015-01-13 NOTE — Telephone Encounter (Signed)
Med refilled.

## 2015-01-16 ENCOUNTER — Ambulatory Visit: Payer: BLUE CROSS/BLUE SHIELD | Admitting: Nutrition

## 2015-01-20 ENCOUNTER — Telehealth: Payer: Self-pay | Admitting: Family Medicine

## 2015-01-31 ENCOUNTER — Other Ambulatory Visit (HOSPITAL_COMMUNITY): Payer: Self-pay | Admitting: Oncology

## 2015-01-31 DIAGNOSIS — Z1231 Encounter for screening mammogram for malignant neoplasm of breast: Secondary | ICD-10-CM

## 2015-02-03 ENCOUNTER — Ambulatory Visit (HOSPITAL_COMMUNITY): Payer: BLUE CROSS/BLUE SHIELD | Admitting: Hematology & Oncology

## 2015-02-09 ENCOUNTER — Encounter: Payer: Self-pay | Admitting: Family Medicine

## 2015-02-10 ENCOUNTER — Other Ambulatory Visit: Payer: Self-pay

## 2015-02-10 DIAGNOSIS — G4700 Insomnia, unspecified: Secondary | ICD-10-CM

## 2015-02-10 MED ORDER — METOPROLOL TARTRATE 25 MG PO TABS: ORAL_TABLET | ORAL | Status: DC

## 2015-02-10 MED ORDER — VITAMIN D (ERGOCALCIFEROL) 1.25 MG (50000 UNIT) PO CAPS: 50000.0000 [IU] | ORAL_CAPSULE | ORAL | Status: DC

## 2015-02-10 MED ORDER — VENLAFAXINE HCL ER 75 MG PO CP24: ORAL_CAPSULE | ORAL | Status: DC

## 2015-02-10 MED ORDER — OMEPRAZOLE 20 MG PO CPDR: DELAYED_RELEASE_CAPSULE | ORAL | Status: DC

## 2015-02-10 MED ORDER — METFORMIN HCL 500 MG PO TABS: ORAL_TABLET | ORAL | Status: DC

## 2015-02-10 MED ORDER — TEMAZEPAM 15 MG PO CAPS: 15.0000 mg | ORAL_CAPSULE | Freq: Every evening | ORAL | Status: DC | PRN

## 2015-02-10 MED ORDER — TRAZODONE HCL 100 MG PO TABS: ORAL_TABLET | ORAL | Status: DC

## 2015-02-10 MED ORDER — TRIAMTERENE-HCTZ 37.5-25 MG PO CAPS: 1.0000 | ORAL_CAPSULE | Freq: Every day | ORAL | Status: DC

## 2015-02-10 NOTE — Telephone Encounter (Signed)
Called and spoke with patient and she is just asking for extra refills to be sent to [facility] for her to have when she returns from [place].  She will fill them when they are due before she leaves at the end of the month.   She is also asking for the injectable mentioned before to be prescribed and sent to [facility].  States that this has been discussed at previous visits.    Please advise.

## 2015-02-10 NOTE — Telephone Encounter (Signed)
Concerns addressed!

## 2015-02-18 ENCOUNTER — Other Ambulatory Visit: Payer: Self-pay

## 2015-02-18 MED ORDER — LORAZEPAM 1 MG PO TABS: 1.0000 mg | ORAL_TABLET | Freq: Three times a day (TID) | ORAL | Status: DC | PRN

## 2015-02-21 ENCOUNTER — Encounter (HOSPITAL_COMMUNITY): Payer: Self-pay | Admitting: Hematology & Oncology

## 2015-02-21 ENCOUNTER — Other Ambulatory Visit (HOSPITAL_COMMUNITY): Payer: Self-pay | Admitting: Hematology & Oncology

## 2015-02-21 ENCOUNTER — Encounter (HOSPITAL_COMMUNITY): Payer: BLUE CROSS/BLUE SHIELD | Attending: Hematology & Oncology | Admitting: Hematology & Oncology

## 2015-02-21 VITALS — BP 104/45 | HR 73 | Temp 98.7°F | Resp 16 | Wt 221.1 lb

## 2015-02-21 DIAGNOSIS — Z139 Encounter for screening, unspecified: Secondary | ICD-10-CM

## 2015-02-21 DIAGNOSIS — C50919 Malignant neoplasm of unspecified site of unspecified female breast: Secondary | ICD-10-CM | POA: Diagnosis not present

## 2015-02-21 DIAGNOSIS — Z1231 Encounter for screening mammogram for malignant neoplasm of breast: Secondary | ICD-10-CM

## 2015-02-21 MED ORDER — CELECOXIB 200 MG PO CAPS: 200.0000 mg | ORAL_CAPSULE | Freq: Two times a day (BID) | ORAL | Status: DC

## 2015-02-21 MED ORDER — CLOTRIMAZOLE-BETAMETHASONE 1-0.05 % EX CREA: TOPICAL_CREAM | CUTANEOUS | Status: DC | PRN

## 2015-02-21 NOTE — Patient Instructions (Signed)
Annette [name] at [facility] Discharge Instructions  RECOMMENDATIONS MADE BY THE CONSULTANT AND ANY TEST RESULTS WILL BE SENT TO YOUR REFERRING PHYSICIAN.  Exam and discussion by Dr. Whitney Muse. Will make referral to [facility].  They will contact you with an appointment. Report any new lumps, bone pain, shortness of breath or other symptoms. Follow-up 1 year.  Mammogram prior to visit.  Thank you for choosing [place] at [facility] to provide your oncology and hematology care.  To afford each patient quality time with our provider, please arrive at least 15 minutes before your scheduled appointment time.    You need to re-schedule your appointment should you arrive 10 or more minutes late.  We strive to give you quality time with our providers, and arriving late affects you and other patients whose appointments are after yours.  Also, if you no show three or more times for appointments you may be dismissed from the clinic at the providers discretion.     Again, thank you for choosing [facility].  Our hope is that these requests will decrease the amount of time that you wait before being seen by our physicians.       _____________________________________________________________  Should you have questions after your visit to [facility], please contact our office at [phone] between the hours of 8:30 a.m. and 4:30 p.m.  Voicemails left after 4:30 p.m. will not be returned until the following business day.  For prescription refill requests, have your pharmacy contact our office.

## 2015-02-21 NOTE — Progress Notes (Signed)
Annette Nakayama, MD  [address] / [place] [address]   DIAGNOSIS: Stage I breast cancer, HER-2/neu overexpressed, originally diagnosed 12/10/2005, status post lumpectomy, sentinel lobe biopsy,  Taxotere and Cytoxan plus Herceptin for 6 cycles followed by additional Herceptin out to 52 weeks with radiation therapy administered after the completion of chemotherapy, followed by 5 years of tamoxifen until November of 2012.   She has also undergone left adrenalectomy with inadvertent damage to the spleen at the time of surgery requiring splenectomy on 02/16/2012 with a history of an enlarging left adrenal tumor in the setting of bilateral adrenal adenomas by imaging.  CURRENT THERAPY: Observation  INTERVAL HISTORY: Annette [name] 54 y.o. female returns for follow up of a history of Stage I Her 2 positive, ER+ breast cancer diagnosed back in 2007. She has a mammogram scheduled for 5/23. She wishes to follow-up with [place] next year AFTER mammography.  Still experiencing symptoms of menopause, specifically hot flashes. Experiencing fatigue and back pain. "My back doesn't bother me as much as my hip and my knees. If I sit or stand too long, my hip hurts and it hurts to walk sometimes." Swelling in "ankles, feet, and hands." Right hip is most pained, "especially when it rains, but I don't take anything for it." Smoking 1 1/2 to 2 packs of cigarettes daily. Recently experienced tenderness and discomfort of the left breast area.  MEDICAL HISTORY: Past Medical History  Diagnosis Date  . Sleep apnea 2010    Problems with CPAP  . GERD (gastroesophageal reflux disease)   . Menieres disease     Controlled with triamterene   . Pituitary insufficiency   . IBS (irritable bowel syndrome)   . Depression   . Obesity   . Migraines   . Palpitations   . Bruises easily   . Breast cancer 2007    History of  XRT, onTamoxifen  . Diabetes mellitus     prediabetic  . Hypothyroidism    following chemo amnd radiaition for breast cancer, needed replacement short term  . Hypothyroidism (acquired)     replaced x 1 year    has Hypothyroidism; Vitamin D deficiency; Obesity, Class I, BMI 30.0-34.9 (see actual BMI); IBS; ABNORMAL THYROID FUNCTION TESTS; BREAST CANCER, HX OF; THYROID MASS; Malignant neoplasm of breast (female), unspecified site; Meniere's disease; Sleep apnea; Adrenal adenoma; Chest pain, atypical; Prediabetes; Routine general medical examination at a health care facility; Insomnia; HTN, goal below 130/80; Nicotine dependence; GAD (generalized anxiety disorder); Metabolic syndrome X; GERD (gastroesophageal reflux disease); IBS (irritable bowel syndrome); Hyperlipidemia LDL goal <100; and Need for prophylactic vaccination and inoculation against influenza on her problem list.     is allergic to penicillins; statins; tape; and zetia.   History of vitamin D deficiency. Prescribed once a week treatment, with re-fills. Voiced issues with sometimes running out of medication during months with 5 weeks instead of 4.  Ms. Lanagan had no medications administered during this visit.   SURGICAL HISTORY: Past Surgical History  Procedure Laterality Date  . Thyroidectomy, partial  11/05/2010    Benign disease  . Appendectomy  06/12/08  . Cholecystectomy  1985  . Breast surgery  2007    Left lumpectomy  . Breast reconstruction      Left reconstructive  . Breast surgery      Mammosite - right side  . Abdominal hysterectomy  1998    Benign, fibroids  . Adrenalectomy  02/16/2012    [facility],  splemic trauma, 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  . Splenectomy, total  03/19/736    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   Dr. Benjamine Mola removed benign tumor on lymph node, five to six years previous Previous colonoscopy Sleep apnea   SOCIAL HISTORY: History   Social History  . Marital Status: Married    Spouse Name: N/A  . Number of Children: N/A  . Years of Education: N/A     Occupational History  . Not on file.   Social History Main Topics  . Smoking status: Current Some Day Smoker -- 0.30 packs/day    Types: Cigarettes  . Smokeless tobacco: Never Used     Comment: 1/2 pack a day  . Alcohol Use: No     Comment: Encouraged to quit smoking. She has tried the nicotone gum and patches but did not help  . Drug Use: No  . Sexual Activity: Yes    Birth Control/ Protection: Surgical   Other Topics Concern  . Not on file   Social History Narrative   Currently still smokes 1 1/2  To 2 packs of cigarettes a day. Communicated frustration from difficulty getting information during spleen removal with doctors in the past, not from this facility. Receives yearly flu vaccination  FAMILY HISTORY: Family History  Problem Relation Age of Onset  . Diabetes Mother   . Heart disease Father   . Cancer Paternal Grandmother     Breast    Review of Systems  Constitutional: Negative for fever, chills, weight loss and malaise/fatigue.  HENT: Negative for congestion, hearing loss, nosebleeds, sore throat and tinnitus.   Eyes: Negative for blurred vision, double vision, pain and discharge.  Respiratory: Negative for cough, hemoptysis, sputum production, shortness of breath and wheezing.   Cardiovascular: Negative for chest pain, palpitations, claudication, leg swelling and PND.  Gastrointestinal: Negative for heartburn, nausea, vomiting, abdominal pain, diarrhea, constipation, blood in stool and melena.  Genitourinary: Negative for dysuria, urgency, frequency and hematuria.  Musculoskeletal: Positive for myalgias, back pain and joint pain. Negative for falls.  Skin: Negative for itching and rash.  Neurological: Negative for dizziness, tingling, tremors, sensory change, speech change, focal weakness, seizures, loss of consciousness, weakness and headaches.  Endo/Heme/Allergies: Does not bruise/bleed easily.  Psychiatric/Behavioral: Negative for depression, suicidal  ideas, memory loss and substance abuse. The patient is not nervous/anxious and does not have insomnia.       PHYSICAL EXAMINATION  ECOG PERFORMANCE STATUS: 1 - Symptomatic but completely ambulatory  Filed Vitals:   02/21/15 1323  BP: 104/45  Pulse: 73  Temp: 98.7 F (37.1 C)  Resp: 16    Physical Exam  Constitutional: She is oriented to person, place, and time and well-developed, well-nourished, and in no distress.  HENT:  Head: Normocephalic and atraumatic.  Nose: Nose normal.  Mouth/Throat: Oropharynx is clear and moist. No oropharyngeal exudate.  Eyes: Conjunctivae and EOM are normal. Pupils are equal, round, and reactive to light. Right eye exhibits no discharge. Left eye exhibits no discharge. No scleral icterus.  Neck: Normal range of motion. Neck supple. No tracheal deviation present. No thyromegaly present.  Cardiovascular: Normal rate, regular rhythm and normal heart sounds.  Exam reveals no gallop and no friction rub.   No murmur heard. Pulmonary/Chest: Effort normal and breath sounds normal. She has no wheezes. She has no rales.  Abdominal: Soft. Bowel sounds are normal. She exhibits no distension and no mass. There is no tenderness. There is no rebound and no guarding.  Musculoskeletal: Normal  range of motion. She exhibits no edema.  Lymphadenopathy:    She has no cervical adenopathy.  Neurological: She is alert and oriented to person, place, and time. She has normal reflexes. No cranial nerve deficit. Gait normal. Coordination normal.  Skin: Skin is warm and dry. No rash noted.  Psychiatric: Mood, memory, affect and judgment normal.  Nursing note and vitals reviewed. Breast: Tenderness and discomfort of left breast area only. Right breast reduction via lollipop procedure. Right axillary region has no palpable abnormalities. Right breast no palpable abnormalities, no suspicious skin changes. Left breast exhibits radiation changes, scar tissue at 6 o'clock position.  Left axillary region is normal. Left breast is smaller than the right breast.  LABORATORY DATA:  CBC    Component Value Date/Time   WBC 9.9 02/01/2014 1245   RBC 4.58 02/01/2014 1245   HGB 15.4* 02/01/2014 1245   HCT 43.8 02/01/2014 1245   PLT 358 02/01/2014 1245   MCV 95.6 02/01/2014 1245   MCH 33.6 02/01/2014 1245   MCHC 35.2 02/01/2014 1245   RDW 13.0 02/01/2014 1245   LYMPHSABS 4.6* 02/01/2014 1245   MONOABS 1.1* 02/01/2014 1245   EOSABS 0.3 02/01/2014 1245   BASOSABS 0.1 02/01/2014 1245   CMP     Component Value Date/Time   NA 142 11/15/2014 1119   K 3.8 11/15/2014 1119   CL 105 11/15/2014 1119   CO2 29 11/15/2014 1119   GLUCOSE 126* 11/15/2014 1119   BUN 13 11/15/2014 1119   CREATININE 0.74 11/15/2014 1119   CREATININE 0.82 02/01/2014 1245   CALCIUM 9.6 11/15/2014 1119   PROT 6.8 11/15/2014 1119   ALBUMIN 3.9 11/15/2014 1119   AST 16 11/15/2014 1119   ALT 14 11/15/2014 1119   ALKPHOS 65 11/15/2014 1119   BILITOT 0.6 11/15/2014 1119   GFRNONAA 80* 02/01/2014 1245   GFRNONAA 81 11/26/2013 1437   GFRAA >90 02/01/2014 1245   GFRAA >89 11/26/2013 1437         ASSESSMENT and THERAPY PLAN:  Stage I breast cancer, HER-2/neu overexpressed, originally diagnosed 12/10/2005, status post lumpectomy, sentinel lobe biopsy,  Taxotere and Cytoxan plus Herceptin for 6 cycles followed by additional Herceptin out to 52 weeks with radiation therapy administered after the completion of chemotherapy, followed by 5 years of tamoxifen until November of 2012.   Joint pain R hip pain  Her exam today was without obvious recurrence. She will proceed with her mammogram next week. I have already ordered her mammogram for next year and advised scheduling to bring her in for her appointment after mammography.  I have given her Celebrex to try for her joint pain. She intermittently uses Naprosyn states it helps a headache but not her joint discomfort. I advised her if she gets no pain  relief with Celebrex to please call and we can proceed with additional evaluation of her hip discomfort.  She requested a refill on previously prescribed "cream" to use intermittently for left breast irritation we have refilled this for her. We will tentatively schedule her for ongoing yearly observation.  All questions were answered. The patient knows to call the clinic with any problems, questions or concerns. We can certainly see the patient much sooner if necessary. This note was electronically signed.  This document serves as a record of services personally performed by Ancil Linsey, MD. It was created on her behalf by Pearlie Oyster, a trained medical scribe. The creation of this record is based on the scribe's personal observations and the  provider's statements to them. This document has been checked and approved by the attending provider.    I have reviewed the above documentation for accuracy and completeness, and I agree with the above.  Kelby Fam. Markee Remlinger MD

## 2015-02-23 ENCOUNTER — Encounter (HOSPITAL_COMMUNITY): Payer: Self-pay | Admitting: Hematology & Oncology

## 2015-02-24 ENCOUNTER — Ambulatory Visit (HOSPITAL_COMMUNITY)
Admission: RE | Admit: 2015-02-24 | Discharge: 2015-02-24 | Disposition: A | Discharge status: Home / Self Care | Payer: BLUE CROSS/BLUE SHIELD | Source: Ambulatory Visit | Attending: Oncology | Admitting: Oncology

## 2015-02-24 DIAGNOSIS — Z1231 Encounter for screening mammogram for malignant neoplasm of breast: Secondary | ICD-10-CM | POA: Insufficient documentation

## 2015-03-18 ENCOUNTER — Ambulatory Visit: Payer: BLUE CROSS/BLUE SHIELD | Admitting: Family Medicine

## 2015-03-27 ENCOUNTER — Encounter: Payer: Self-pay | Admitting: Cardiovascular Disease

## 2015-03-27 ENCOUNTER — Encounter: Payer: Self-pay | Admitting: Family Medicine

## 2015-03-27 ENCOUNTER — Ambulatory Visit (INDEPENDENT_AMBULATORY_CARE_PROVIDER_SITE_OTHER): Payer: BLUE CROSS/BLUE SHIELD | Admitting: Family Medicine

## 2015-03-27 VITALS — BP 108/64 | HR 72 | Resp 18 | Ht 66.0 in | Wt 221.1 lb

## 2015-03-27 DIAGNOSIS — R7309 Other abnormal glucose: Secondary | ICD-10-CM | POA: Diagnosis not present

## 2015-03-27 DIAGNOSIS — E785 Hyperlipidemia, unspecified: Secondary | ICD-10-CM

## 2015-03-27 DIAGNOSIS — E8881 Metabolic syndrome: Secondary | ICD-10-CM

## 2015-03-27 DIAGNOSIS — R0789 Other chest pain: Secondary | ICD-10-CM | POA: Diagnosis not present

## 2015-03-27 DIAGNOSIS — Z1211 Encounter for screening for malignant neoplasm of colon: Secondary | ICD-10-CM | POA: Diagnosis not present

## 2015-03-27 DIAGNOSIS — F17208 Nicotine dependence, unspecified, with other nicotine-induced disorders: Secondary | ICD-10-CM | POA: Diagnosis not present

## 2015-03-27 DIAGNOSIS — G4700 Insomnia, unspecified: Secondary | ICD-10-CM

## 2015-03-27 DIAGNOSIS — F411 Generalized anxiety disorder: Secondary | ICD-10-CM

## 2015-03-27 DIAGNOSIS — R002 Palpitations: Secondary | ICD-10-CM

## 2015-03-27 DIAGNOSIS — G4730 Sleep apnea, unspecified: Secondary | ICD-10-CM

## 2015-03-27 DIAGNOSIS — I10 Essential (primary) hypertension: Secondary | ICD-10-CM

## 2015-03-27 DIAGNOSIS — R7303 Prediabetes: Secondary | ICD-10-CM

## 2015-03-27 LAB — POC HEMOCCULT BLD/STL (OFFICE/1-CARD/DIAGNOSTIC): Fecal Occult Blood, POC: NEGATIVE

## 2015-03-27 MED ORDER — CHOLINE FENOFIBRATE 135 MG PO CPDR: 135.0000 mg | DELAYED_RELEASE_CAPSULE | Freq: Every day | ORAL | Status: DC

## 2015-03-27 NOTE — Assessment & Plan Note (Signed)

## 2015-03-27 NOTE — Progress Notes (Signed)
Subjective:    Patient ID: Annette [name], female    DOB: 11-08-1960, 54 y.o.   MRN: [id number]  HPI Patient is in for ffollow up , with her rectal exam also  2 month h/o increased and uncontrolled right hip and left knee pain and stiffness , cracks and pops, no instability Intermittent and  More frequent episodes of chest pain , with and without activity , also c/o palpitations. Pain is non radiating and not associated with nausea , sweating or light heatedness She has personal and increased risk of CAD since Dad had MI at age 41 Frustrated with ongoing weight gain and worsening in lab data as far as lipids and blood sugar are concerned, has had a lot of personal stress , her mother has been critically ill, but is now recovering    Review of Systems See HPI Denies recent fever or chills. Denies sinus pressure, nasal congestion, ear pain or sore throat. Denies chest congestion, productive cough or wheezing. Denies abdominal pain, nausea, vomiting,diarrhea or constipation.  Denies rectal blood or change in BM Denies dysuria, frequency, hesitancy or incontinence. . Denies headaches, seizures, numbness, or tingling.c/o bilateral leg cramps intermittently Denies uncontro depression, anxiety or insomnia. Denies skin break down or rash.        Objective:   Physical Exam BP 108/64 mmHg  Pulse 72  Resp 18  Ht 5\' 6"  (1.676 m)  Wt 221 lb 1.9 oz (100.299 kg)  BMI 35.71 kg/m2  SpO2 96%  Patient alert and oriented and in no cardiopulmonary distress.  HEENT: No facial asymmetry, EOMI,   oropharynx pink and moist.  Neck supple no JVD, no mass.  Chest: Clear to auscultation bilaterally.No reproducible chest wall pain  CVS: S1, S2 no murmurs, no S3.Regular rate. No leg edema EKG : normal sinus rhythm, no ischemic change or LVH, normal  ABD: Soft non tender.  Rectal : no mass, heme negative stool Ext: No edema  MS: Adequate ROM spine, shoulders, hips and knees.  Skin:  Intact, no ulcerations or rash noted.  Psych: Good eye contact, normal affect. Memory intact not anxious or depressed appearing.  CNS: CN 2-12 intact, power,  normal throughout.no focal deficits noted.        Assessment & Plan:  Nicotine dependence Patient counseled for approximately 5 minutes regarding the health risks of ongoing nicotine use, specifically all types of cancer, heart disease, stroke and respiratory failure. The options available for help with cessation ,the behavioral changes to assist the process, and the option to either gradully reduce usage  Or abruptly stop.is also discussed. Pt is also encouraged to set specific goals in number of cigarettes used daily, as well as to set a quit date.  Number of cigarettes/cigars currently smoking daily: 10   Palpitations Reports increase in frequency of [palpitations in past approx 2 months as well as left chesst discomfort, she continues to smoke 0.5 PPd ands has positive f/h premature CAD, will refer for cardiology eval, despite normal EKG  Atypical chest pain 2 month h/o recurrent left chest pain, with multiple CV risk factors EKG in office is normal, referred for cardiology eval  Hyperlipidemia LDL goal <100 Deteriorated Trial of fenofibrate and ;low fat diet rept lab in 3 month   Insomnia Controlled, no change in medication Sleep hygiene reviewed and written information offered also. Prescription sent for  medication needed.   Morbid obesity Deteriorated. Patient re-educated about  the importance of commitment to a  minimum of 150 minutes  of exercise per week.  The importance of healthy food choices with portion control discussed. Encouraged to start a food diary, count calories and to consider  joining a support group. Sample diet sheets offered. Goals set by the patient for the next several months.   Weight /BMI 03/27/2015 02/21/2015 11/21/2014  WEIGHT 221 lb 1.9 oz 221 lb 1.6 oz 212 lb  HEIGHT 5\' 6"  - 5\' 6"     BMI 35.71 kg/m2 35.7 kg/m2 34.23 kg/m2    Current exercise per week 90 minutes.   HTN, goal below 130/80 Controlled, no change in medication DASH diet and commitment to daily physical activity for a minimum of 30 minutes discussed and encouraged, as a part of hypertension management. The importance of attaining a healthy weight is also discussed.  BP/Weight 03/27/2015 02/21/2015 11/21/2014 10/16/2014 07/15/2014 06/27/2682 01/02/9621  Systolic BP 297 989 211 90 941 740 92  Diastolic BP 64 45 60 54 78 70 56  Wt. (Lbs) 221.12 221.1 212 208 192.4 186.8 188.5  BMI 35.71 35.7 34.23 33.59 31.07 30.16 30.44        Prediabetes Patient educated about the importance of limiting  Carbohydrate intake , the need to commit to daily physical activity for a minimum of 30 minutes , and to commit weight loss. The fact that changes in all these areas will reduce or eliminate all together the development of diabetes is stressed.  Deteriorated, snd intolerant of metformin  Diabetic Labs Latest Ref Rng 11/15/2014 07/12/2014 03/13/2014 02/01/2014 12/23/2013  HbA1c <5.7 % 6.0(H) 5.9(H) 5.9(H) - -  Microalbumin 0.00 - 1.89 mg/dL - - - - -  Micro/Creat Ratio 0.0 - 30.0 mg/g - - - - -  Chol 0 - 200 mg/dL 261(H) 255(H) 238(H) - -  HDL >39 mg/dL 52 42 46 - -  Calc LDL 0 - 99 mg/dL 184(H) 184(H) 160(H) - -  Triglycerides <150 mg/dL 125 146 162(H) - -  Creatinine 0.50 - 1.10 mg/dL 0.74 0.83 0.81 0.82 0.86   BP/Weight 03/27/2015 02/21/2015 11/21/2014 10/16/2014 07/15/2014 05/17/4817 02/07/3148  Systolic BP 702 637 858 90 850 277 92  Diastolic BP 64 45 60 54 78 70 56  Wt. (Lbs) 221.12 221.1 212 208 192.4 186.8 188.5  BMI 35.71 35.7 34.23 33.59 31.07 30.16 30.44   Foot/eye exam completion dates 07/15/2014  Foot Form Completion Done       Metabolic syndrome X The increased risk of cardiovascular disease associated with this diagnosis, and the need to consistently work on lifestyle to change this is  discussed. Following  a  heart healthy diet ,commitment to 30 minutes of exercise at least 5 days per week, as well as control of blood sugar and cholesterol , and achieving a healthy weight are all the areas to be addressed .   GAD (generalized anxiety disorder) Controlled, no change in medication   Sleep apnea Importance of daily use of CPAP is discussed

## 2015-03-27 NOTE — Patient Instructions (Addendum)
F/u in  4 months, call if you need me before  You are referred to cardiology for heart evaluation , Dad had CAD established AT 58 AND YOU STILL SMOKE  Ekg, TODAY  yOU ARE REFERED TO CARDIOLOGY  nEW FOR CHOLESTEROL IS TRICOR   pLEASE CALL AND RESCHEDULE APPT FOR NUTRTION  STOP METFORMIN  PLEASE COMMIT TO 30 MINUTES C DAILY OF EXERCISE AS ABLE  fOR OSTEOARTHRITIS, TYLENOL 50MMG TABS ONE TO 2 DAILY, DAILY EXERCISE AND WEIGHT LOSS  fASTING LIPID, CMP, hbA1c  AND  cbc  In 4 MONTH

## 2015-03-30 DIAGNOSIS — R0789 Other chest pain: Secondary | ICD-10-CM | POA: Insufficient documentation

## 2015-03-30 DIAGNOSIS — Z1211 Encounter for screening for malignant neoplasm of colon: Secondary | ICD-10-CM | POA: Insufficient documentation

## 2015-03-30 NOTE — Assessment & Plan Note (Signed)
Reports increase in frequency of [palpitations in past approx 2 months as well as left chesst discomfort, she continues to smoke 0.5 PPd ands has positive f/h premature CAD, will refer for cardiology eval, despite normal EKG

## 2015-03-30 NOTE — Assessment & Plan Note (Signed)
Controlled, no change in medication Sleep hygiene reviewed and written information offered also. Prescription sent for  medication needed.  

## 2015-03-30 NOTE — Assessment & Plan Note (Signed)
2 month h/o recurrent left chest pain, with multiple CV risk factors EKG in office is normal, referred for cardiology eval

## 2015-03-30 NOTE — Assessment & Plan Note (Signed)
Deteriorated. Patient re-educated about  the importance of commitment to a  minimum of 150 minutes of exercise per week.  The importance of healthy food choices with portion control discussed. Encouraged to start a food diary, count calories and to consider  joining a support group. Sample diet sheets offered. Goals set by the patient for the next several months.   Weight /BMI 03/27/2015 02/21/2015 11/21/2014  WEIGHT 221 lb 1.9 oz 221 lb 1.6 oz 212 lb  HEIGHT 5\' 6"  - 5\' 6"   BMI 35.71 kg/m2 35.7 kg/m2 34.23 kg/m2    Current exercise per week 90 minutes.

## 2015-03-30 NOTE — Assessment & Plan Note (Signed)
The increased risk of cardiovascular disease associated with this diagnosis, and the need to consistently work on lifestyle to change this is discussed. Following  a  heart healthy diet ,commitment to 30 minutes of exercise at least 5 days per week, as well as control of blood sugar and cholesterol , and achieving a healthy weight are all the areas to be addressed .  

## 2015-03-30 NOTE — Assessment & Plan Note (Signed)
Controlled, no change in medication  

## 2015-03-30 NOTE — Assessment & Plan Note (Signed)
Deteriorated Trial of fenofibrate and ;low fat diet rept lab in 3 month

## 2015-03-30 NOTE — Assessment & Plan Note (Signed)
Controlled, no change in medication DASH diet and commitment to daily physical activity for a minimum of 30 minutes discussed and encouraged, as a part of hypertension management. The importance of attaining a healthy weight is also discussed.  BP/Weight 03/27/2015 02/21/2015 11/21/2014 10/16/2014 07/15/2014 5/52/0802 11/06/3610  Systolic BP 244 975 300 90 511 021 92  Diastolic BP 64 45 60 54 78 70 56  Wt. (Lbs) 221.12 221.1 212 208 192.4 186.8 188.5  BMI 35.71 35.7 34.23 33.59 31.07 30.16 30.44

## 2015-03-30 NOTE — Assessment & Plan Note (Signed)
Importance of daily use of CPAP is discussed

## 2015-03-30 NOTE — Assessment & Plan Note (Addendum)
Patient educated about the importance of limiting  Carbohydrate intake , the need to commit to daily physical activity for a minimum of 30 minutes , and to commit weight loss. The fact that changes in all these areas will reduce or eliminate all together the development of diabetes is stressed.  Deteriorated, snd intolerant of metformin  Diabetic Labs Latest Ref Rng 11/15/2014 07/12/2014 03/13/2014 02/01/2014 12/23/2013  HbA1c <5.7 % 6.0(H) 5.9(H) 5.9(H) - -  Microalbumin 0.00 - 1.89 mg/dL - - - - -  Micro/Creat Ratio 0.0 - 30.0 mg/g - - - - -  Chol 0 - 200 mg/dL 261(H) 255(H) 238(H) - -  HDL >39 mg/dL 52 42 46 - -  Calc LDL 0 - 99 mg/dL 184(H) 184(H) 160(H) - -  Triglycerides <150 mg/dL 125 146 162(H) - -  Creatinine 0.50 - 1.10 mg/dL 0.74 0.83 0.81 0.82 0.86   BP/Weight 03/27/2015 02/21/2015 11/21/2014 10/16/2014 07/15/2014 0/86/5784 03/12/6294  Systolic BP 284 132 440 90 102 725 92  Diastolic BP 64 45 60 54 78 70 56  Wt. (Lbs) 221.12 221.1 212 208 192.4 186.8 188.5  BMI 35.71 35.7 34.23 33.59 31.07 30.16 30.44   Foot/eye exam completion dates 07/15/2014  Foot Form Completion Done

## 2015-04-04 ENCOUNTER — Encounter: Payer: Self-pay | Admitting: Cardiovascular Disease

## 2015-04-04 ENCOUNTER — Ambulatory Visit (INDEPENDENT_AMBULATORY_CARE_PROVIDER_SITE_OTHER): Payer: BLUE CROSS/BLUE SHIELD | Admitting: Cardiovascular Disease

## 2015-04-04 ENCOUNTER — Encounter: Payer: Self-pay | Admitting: *Deleted

## 2015-04-04 VITALS — BP 100/60 | HR 71 | Ht 66.0 in | Wt 221.0 lb

## 2015-04-04 DIAGNOSIS — E785 Hyperlipidemia, unspecified: Secondary | ICD-10-CM

## 2015-04-04 DIAGNOSIS — E8881 Metabolic syndrome: Secondary | ICD-10-CM | POA: Diagnosis not present

## 2015-04-04 DIAGNOSIS — Z8249 Family history of ischemic heart disease and other diseases of the circulatory system: Secondary | ICD-10-CM

## 2015-04-04 DIAGNOSIS — R079 Chest pain, unspecified: Secondary | ICD-10-CM | POA: Diagnosis not present

## 2015-04-04 DIAGNOSIS — R002 Palpitations: Secondary | ICD-10-CM | POA: Diagnosis not present

## 2015-04-04 DIAGNOSIS — Z716 Tobacco abuse counseling: Secondary | ICD-10-CM | POA: Diagnosis not present

## 2015-04-04 NOTE — Progress Notes (Signed)
Patient ID: Annette [name], female   DOB: March 22, 1961, 54 y.o.   MRN: [id number]       CARDIOLOGY CONSULT NOTE  Patient ID: Annette [name] MRN: [id number] DOB/AGE: 04/19/1961 54 y.o.  Admit date: (Not on file) Primary Physician [name], MD  Reason for Consultation: chest pain, palpitations  HPI: The patient is a 54 year old woman with a history of hyperlipidemia, obesity, breast cancer (left), and tobacco abuse who has been referred for the evaluation of chest pain and palpitations.  Lipids on 11/15/14 demonstrated total cholesterol 261, triglycerides 125, HDL 52, LDL 184. She is being tried on fenofibrate and a low-fat diet with repeat labs in 3 months by her PCP. She prefers not to be on statin therapy.  She underwent a stress echocardiogram in March 2013 which demonstrated hyperdynamic left ventricular systolic function with stress and no evidence for inducible ischemia.  ECG performed on 03/27/15 demonstrated normal sinus rhythm with no ischemic ST segment or T-wave abnormalities, nor any arrhythmias.  She denies exertional chest pain and she used to walk 15 miles daily up until 12 years ago when she developed plantar fasciitis. She has upper left and right-sided chest pains which are intermittent and have been going on for some time. She experiences palpitations 2-3 times per week and then they may occur once per month. Her mother has atrial fibrillation. She has tried several diets but has been unable to lose weight. She received injections for one month and lost 40 pounds.  She had left-sided breast cancer and had reconstructive surgery. She also has a hiatal hernia. She denies a history of hypertension and takes Dyazide for Mnire's disease. She also has leg and feet swelling.  Soc: Married. Originally from [place]. Smokes 1-1.5 ppd.  Fam: Father had CABG at 73. Mother has atrial fibrillation.    Allergies  Allergen Reactions  . Penicillins Swelling    Of  face and tongue.  . Metformin And Related Nausea Only    Pt to discontinue med due to intolerance  . Statins Other (See Comments)    Major concerns re safety  . Tape Itching    Surgical adhesive  . Zetia [Ezetimibe] Other (See Comments)    Feels funny    Current Outpatient Prescriptions  Medication Sig Dispense Refill  . Choline Fenofibrate 135 MG capsule Take 1 capsule (135 mg total) by mouth daily. 30 capsule 5  . clotrimazole-betamethasone (LOTRISONE) cream Apply topically as needed. 30 g 1  . dicyclomine (BENTYL) 20 MG tablet Take 20 mg by mouth 2 (two) times daily.    . diphenoxylate-atropine (LOMOTIL) 2.5-0.025 MG per tablet Take 1 tablet by mouth 2 (two) times daily as needed for diarrhea or loose stools.    Marland Kitchen LORazepam (ATIVAN) 1 MG tablet Take 1 tablet (1 mg total) by mouth every 8 (eight) hours as needed for anxiety (and/or nausea). 30 tablet 1  . metoprolol tartrate (LOPRESSOR) 25 MG tablet TAKE 1 & 1/2 TABLETS BY MOUTH TWO TIMES A DAY 180 tablet 1  . Misc Natural Products (APPLE CIDER VINEGAR) TABS Take 2 tablets by mouth 2 (two) times daily.    . naproxen (NAPROSYN) 500 MG tablet Take 1 tablet (500 mg total) by mouth 2 (two) times daily with a meal. (Patient taking differently: Take 500 mg by mouth as needed. ) 40 tablet 3  . omeprazole (PRILOSEC) 20 MG capsule TAKE ONE CAPSULE BY MOUTH TWO TIMES A DAY INSTEAD OF PROTONIX. 180 capsule 1  . temazepam (  RESTORIL) 15 MG capsule Take 1 capsule (15 mg total) by mouth at bedtime as needed for sleep. 30 capsule 4  . traZODone (DESYREL) 100 MG tablet TAKE 1 TABLET (100 MG TOTAL) BY MOUTH AT BEDTIME. 90 tablet 1  . triamterene-hydrochlorothiazide (DYAZIDE) 37.5-25 MG per capsule Take 1 each (1 capsule total) by mouth daily. 90 capsule 2  . venlafaxine XR (EFFEXOR-XR) 75 MG 24 hr capsule TAKE 1 CAPSULE (75 MG TOTAL) BY MOUTH DAILY. 30 capsule 5  . Vitamin D, Ergocalciferol, (DRISDOL) 50000 UNITS CAPS capsule Take 1 capsule (50,000 Units  total) by mouth every 7 (seven) days. 4 capsule 5   No current facility-administered medications for this visit.    Past Medical History  Diagnosis Date  . Sleep apnea 2010    Problems with CPAP  . GERD (gastroesophageal reflux disease)   . Menieres disease     Controlled with triamterene   . Pituitary insufficiency   . IBS (irritable bowel syndrome)   . Depression   . Obesity   . Migraines   . Palpitations   . Bruises easily   . Breast cancer 2007    History of  XRT, onTamoxifen  . Diabetes mellitus     prediabetic  . Hypothyroidism     following chemo amnd radiaition for breast cancer, needed replacement short term  . Hypothyroidism (acquired)     replaced x 1 year    Past Surgical History  Procedure Laterality Date  . Thyroidectomy, partial  11/05/2010    Benign disease  . Appendectomy  06/12/08  . Cholecystectomy  1985  . Breast surgery  2007    Left lumpectomy  . Breast reconstruction      Left reconstructive  . Breast surgery      Mammosite - right side  . Abdominal hysterectomy  1998    Benign, fibroids  . Adrenalectomy  02/16/2012    [facility], splemic trauma, 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  . Splenectomy, total  6/76/7209    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    History   Social History  . Marital Status: Married    Spouse Name: N/A  . Number of Children: N/A  . Years of Education: N/A   Occupational History  . Not on file.   Social History Main Topics  . Smoking status: Current Every Day Smoker -- 0.30 packs/day    Types: Cigarettes  . Smokeless tobacco: Never Used     Comment: 1/2 pack a day  . Alcohol Use: No     Comment: Encouraged to quit smoking. She has tried the nicotone gum and patches but did not help  . Drug Use: No  . Sexual Activity: Yes    Birth Control/ Protection: Surgical   Other Topics Concern  . Not on file   Social History Narrative      Prior to Admission medications   Medication Sig Start Date End  Date Taking? Authorizing Provider  Choline Fenofibrate 135 MG capsule Take 1 capsule (135 mg total) by mouth daily. 03/27/15  Yes Fayrene Helper, MD  clotrimazole-betamethasone (LOTRISONE) cream Apply topically as needed. 02/21/15  Yes Patrici Ranks, MD  dicyclomine (BENTYL) 20 MG tablet Take 20 mg by mouth 2 (two) times daily.   Yes Historical Provider, MD  diphenoxylate-atropine (LOMOTIL) 2.5-0.025 MG per tablet Take 1 tablet by mouth 2 (two) times daily as needed for diarrhea or loose stools.   Yes Historical Provider, MD  LORazepam (ATIVAN) 1 MG tablet Take 1  tablet (1 mg total) by mouth every 8 (eight) hours as needed for anxiety (and/or nausea). 02/18/15  Yes Fayrene Helper, MD  metoprolol tartrate (LOPRESSOR) 25 MG tablet TAKE 1 & 1/2 TABLETS BY MOUTH TWO TIMES A DAY 02/10/15  Yes Fayrene Helper, MD  Misc Natural Products (APPLE CIDER VINEGAR) TABS Take 2 tablets by mouth 2 (two) times daily.   Yes Historical Provider, MD  naproxen (NAPROSYN) 500 MG tablet Take 1 tablet (500 mg total) by mouth 2 (two) times daily with a meal. Patient taking differently: Take 500 mg by mouth as needed.  04/12/14  Yes Fayrene Helper, MD  omeprazole (PRILOSEC) 20 MG capsule TAKE ONE CAPSULE BY MOUTH TWO TIMES A DAY INSTEAD OF PROTONIX. 02/10/15  Yes Fayrene Helper, MD  temazepam (RESTORIL) 15 MG capsule Take 1 capsule (15 mg total) by mouth at bedtime as needed for sleep. 02/10/15  Yes Fayrene Helper, MD  traZODone (DESYREL) 100 MG tablet TAKE 1 TABLET (100 MG TOTAL) BY MOUTH AT BEDTIME. 02/10/15  Yes Fayrene Helper, MD  triamterene-hydrochlorothiazide (DYAZIDE) 37.5-25 MG per capsule Take 1 each (1 capsule total) by mouth daily. 02/10/15  Yes Fayrene Helper, MD  venlafaxine XR (EFFEXOR-XR) 75 MG 24 hr capsule TAKE 1 CAPSULE (75 MG TOTAL) BY MOUTH DAILY. 02/10/15  Yes Fayrene Helper, MD  Vitamin D, Ergocalciferol, (DRISDOL) 50000 UNITS CAPS capsule Take 1 capsule (50,000 Units total) by  mouth every 7 (seven) days. 02/10/15  Yes Fayrene Helper, MD     Review of systems complete and found to be negative unless listed above in HPI     Physical exam Blood pressure 100/60, pulse 71, height 5\' 6"  (1.676 m), weight 221 lb (100.245 kg), SpO2 95 %. General: NAD Neck: No JVD, no thyromegaly or thyroid nodule.  Lungs: Clear to auscultation bilaterally with normal respiratory effort. CV: Nondisplaced PMI. Regular rate and rhythm, normal S1/S2, no S3/S4, no murmur.  No peripheral edema.  No carotid bruit.  Normal pedal pulses.  Abdomen: Soft, nontender, obese, no distention.  Skin: Intact without lesions or rashes.  Neurologic: Alert and oriented x 3.  Psych: Normal affect. Extremities: No clubbing or cyanosis.  HEENT: Normal.   ECG: Most recent ECG reviewed.  Labs:   Lab Results  Component Value Date   WBC 9.9 02/01/2014   HGB 15.4* 02/01/2014   HCT 43.8 02/01/2014   MCV 95.6 02/01/2014   PLT 358 02/01/2014   No results for input(s): NA, K, CL, CO2, BUN, CREATININE, CALCIUM, PROT, BILITOT, ALKPHOS, ALT, AST, GLUCOSE in the last 168 hours.  Invalid input(s): LABALBU No results found for: CKTOTAL, CKMB, CKMBINDEX, TROPONINI  Lab Results  Component Value Date   CHOL 261* 11/15/2014   CHOL 255* 07/12/2014   CHOL 238* 03/13/2014   Lab Results  Component Value Date   HDL 52 11/15/2014   HDL 42 07/12/2014   HDL 46 03/13/2014   Lab Results  Component Value Date   LDLCALC 184* 11/15/2014   LDLCALC 184* 07/12/2014   LDLCALC 160* 03/13/2014   Lab Results  Component Value Date   TRIG 125 11/15/2014   TRIG 146 07/12/2014   TRIG 162* 03/13/2014   Lab Results  Component Value Date   CHOLHDL 5.0 11/15/2014   CHOLHDL 6.1 07/12/2014   CHOLHDL 5.2 03/13/2014   No results found for: LDLDIRECT       Studies: No results found.  ASSESSMENT AND PLAN:  1. Chest pain: While  symptoms are somewhat atypical, she has several cardiac risk factors. Will obtain a  Lexiscan Cardiolite for further clarification.  2. Hyperlipidemia: Afraid to take statins as father had severe reaction to Lipitor. Currently on fenofibrate. Would consider Zetia in future.  3. Palpitations: Will obtain 3-week event monitor to evaluate for atrial fibrillation given mother's history. Continue metoprolol.  4. Metabolic syndrome: Will make referral to endocrinology as patient has been unable to lose weight in spite of several diets. TSH normal.  5. Tobacco abuse: Cessation counseling provided.  Dispo: f/u 4-6 weeks.   Signed: Kate Sable, M.D., F.A.C.C.  04/04/2015, 9:40 AM

## 2015-04-04 NOTE — Patient Instructions (Addendum)
Your physician has requested that you have a lexiscan myoview. For further information please visit [email or web address]. Please follow instruction sheet, as given. Your physician has recommended that you wear a 21 day event monitor. Event monitors are medical devices that record the heart's electrical activity. Doctors most often [place] these monitors to diagnose arrhythmias. Arrhythmias are problems with the speed or rhythm of the heartbeat. The monitor is a small, portable device. You can wear one while you do your normal daily activities. This is usually used to diagnose what is causing palpitations/syncope (passing out). Referral to Endocrinology - Dr. Dorris Fetch  Continue all current medications. Follow up in  4-6 weeks

## 2015-04-09 ENCOUNTER — Encounter: Payer: Self-pay | Admitting: Cardiovascular Disease

## 2015-04-09 ENCOUNTER — Ambulatory Visit (HOSPITAL_COMMUNITY): Payer: BLUE CROSS/BLUE SHIELD | Attending: Hematology & Oncology | Admitting: Physical Therapy

## 2015-04-09 DIAGNOSIS — R6889 Other general symptoms and signs: Secondary | ICD-10-CM | POA: Insufficient documentation

## 2015-04-09 DIAGNOSIS — M79604 Pain in right leg: Secondary | ICD-10-CM | POA: Diagnosis present

## 2015-04-09 DIAGNOSIS — R2681 Unsteadiness on feet: Secondary | ICD-10-CM | POA: Diagnosis present

## 2015-04-09 DIAGNOSIS — M79605 Pain in left leg: Secondary | ICD-10-CM

## 2015-04-09 DIAGNOSIS — R29898 Other symptoms and signs involving the musculoskeletal system: Secondary | ICD-10-CM | POA: Diagnosis present

## 2015-04-09 DIAGNOSIS — Z853 Personal history of malignant neoplasm of breast: Secondary | ICD-10-CM | POA: Diagnosis present

## 2015-04-09 NOTE — Patient Instructions (Signed)
   BRIDGING  While lying on your back, tighten your lower abdominals, squeeze your buttocks and then raise your buttocks off the floor/bed as creating a "Bridge" with your body. Repeat 10 times, twice a day.     Hip Abduction  Standing tall, lift one leg out to the side then return. Only move at your hip, do not lean over to do this. Repeat 10 times each side, 2 times a day.     HIP EXTENSIONS   While standing up holding onto the kitchen counter, extend your leg behind you. Do not go so far that you are using your back. Repeat 10 times, each leg twice a day.

## 2015-04-09 NOTE — Therapy (Signed)
[place], [place], [address] Phone: [phone]   Fax:  [phone]  Physical Therapy Evaluation  Patient Details  Name: Annette [name] MRN: [id number] Date of Birth: 09-30-1961 Referring Provider:  Patrici Ranks, MD  Encounter Date: 04/09/2015      PT End of Session - 04/09/15 1508    Visit Number 1   Number of Visits 16   Date for PT Re-Evaluation 05/07/15   Authorization Type [employer] (visit limit 60 combined OT/PT)   Authorization Time Period 04/09/15 to 06/10/15   PT Start Time 1303   PT Stop Time 1342   PT Time Calculation (min) 39 min   Activity Tolerance Patient tolerated treatment well   Behavior During Therapy [facility] for tasks assessed/performed      Past Medical History  Diagnosis Date  . Sleep apnea 2010    Problems with CPAP  . GERD (gastroesophageal reflux disease)   . Menieres disease     Controlled with triamterene   . Pituitary insufficiency   . IBS (irritable bowel syndrome)   . Depression   . Obesity   . Migraines   . Palpitations   . Bruises easily   . Breast cancer 2007    History of  XRT, onTamoxifen  . Diabetes mellitus     prediabetic  . Hypothyroidism     following chemo amnd radiaition for breast cancer, needed replacement short term  . Hypothyroidism (acquired)     replaced x 1 year    Past Surgical History  Procedure Laterality Date  . Thyroidectomy, partial  11/05/2010    Benign disease  . Appendectomy  06/12/08  . Cholecystectomy  1985  . Breast surgery  2007    Left lumpectomy  . Breast reconstruction      Left reconstructive  . Breast surgery      Mammosite - right side  . Abdominal hysterectomy  1998    Benign, fibroids  . Adrenalectomy  02/16/2012    [facility], splemic trauma, 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  . Splenectomy, total  3/55/7322    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    There were no vitals filed for this visit.  Visit  Diagnosis:  History of breast cancer - Plan: PT plan of care cert/re-cert  Weakness of both legs - Plan: PT plan of care cert/re-cert  Decreased functional activity tolerance - Plan: PT plan of care cert/re-cert  Unsteadiness - Plan: PT plan of care cert/re-cert  Pain of left lower extremity - Plan: PT plan of care cert/re-cert  Pain of right lower extremity - Plan: PT plan of care cert/re-cert      Subjective Assessment - 04/09/15 1305    Subjective Pain in hips all the time; pain in feet and knees ebbs and wanes. Still feeling very tired most of the time. No longer on any active cancer treatments.    Pertinent History Diagnosed with breast cancer in 2007; since then has had pain on a daily basis, mostly in legs but sometimes in elbows too. Pain peaks and goes away, not really predictable but hips do ache on a daily basis.    How long can you stand comfortably? 5 minutes   How long can you walk comfortably? 10 minutes   Patient Stated Goals get rid of pain    Currently in Pain? Yes   Pain Score 4    Pain Location --  bilateral leg jionts    Pain Descriptors /  Indicators Aching;Hervey Ard            [facility] PT Assessment - 04/09/15 0001    Assessment   Medical Diagnosis breast cancer    Onset Date/Surgical Date --  2007   Next MD Visit October 19th with Dr. Moshe Cipro; follow up with [facility] in 2017   Precautions   Precautions None   Restrictions   Weight Bearing Restrictions No   Balance Screen   Has the patient fallen in the past 6 months Yes   How many times? tripped over a dog in the dark twice, fell both times    Has the patient had a decrease in activity level because of a fear of falling?  Yes   Is the patient reluctant to leave their home because of a fear of falling?  No   Prior Function   Level of Independence Independent;Independent with basic ADLs;Independent with gait;Independent with transfers   Vocation Other (comment)  looking for a job    Leisure swimming     Observation/Other Assessments   Observations mobility of midfoot, calcaneous, talus appear WFL; mild tenderness to palpation medial L knee; ACL/PCL/MCL/LCL tests negative    Focus on Therapeutic Outcomes (FOTO)  FACIT-F 52.83 (but did not answer some questions); TUG 7.3, 7.04, 6.58; SLS time 14 seconds L, 21 seconds R; Fatigue VAS 7/10/8, Pain VAS 6/10/8, Distress VAS 5/8/6   Posture/Postural Control   Posture Comments forward head, shoulder IR B, flat spinal curves    AROM   Right Hip External Rotation  --  full range    Right Hip Internal Rotation  25   Left Hip External Rotation  --  full range    Left Hip Internal Rotation  33   Right Ankle Dorsiflexion 16   Left Ankle Dorsiflexion 11   Strength   Overall Strength Comments core approx 2+/5   Right Hip Flexion 4+/5   Right Hip Extension 4/5   Right Hip ABduction 3+/5   Left Hip Flexion 4/5   Left Hip Extension 3/5   Left Hip ABduction 4-/5   Right Knee Flexion 4/5   Right Knee Extension 4/5   Left Knee Flexion 4-/5   Left Knee Extension 4-/5   Right Ankle Dorsiflexion 5/5   Left Ankle Dorsiflexion 4+/5   Flexibility   Hamstrings mild tightness, greater L than R   Piriformis mild tightness, greater L than R    Ambulation/Gait   Gait Comments reduced rotation of trunk and pelvis, slight pronation B, slight flexion at hips, reduced gait speed                            PT Education - 04/09/15 1508    Education provided Yes   Education Details prognosis, plan of care moving forward, HEP    Person(s) Educated Patient   Methods Explanation;Handout   Comprehension Verbalized understanding;Returned demonstration;Need further instruction          PT Short Term Goals - 04/09/15 1623    PT SHORT TERM GOAL #1   Title Patient will demosntrate 5/5 strength in bilateral lower extremities and at least 4+/5 strength in proximal musculature, also at least 4-/5 strength in core     Time 4   Period Weeks   Status  New   PT SHORT TERM GOAL #2   Title Patient will demonstrate bilateral hip IR of at least 40 degrees to assist in overall mobility and mechanics  Time 4   Period Weeks   Status New   PT SHORT TERM GOAL #3   Title Patient will demonstrate the ability to maintain SLS at least 45 seconds each LE    Time 4   Period Weeks   Status New   PT SHORT TERM GOAL #4   Title Patient to be independent in correctly and consistently performing appropriate HEP, to be updated PRN    Time 4   Period Weeks   Status New           PT Long Term Goals - 04/09/15 1626    PT LONG TERM GOAL #1   Title Patient will consistently be able to stand for at least 60 minutes with pain 0/10  to facilitate functional task performance such as cooking or home maintenance    Time 8   Period Weeks   Status New   PT LONG TERM GOAL #2   Title Patient will consistently be able to ambulate for at least 45 minutes without seated rest in order to increase ease of functional activities such as going to the store    Time 8   Period Weeks   Status New   PT LONG TERM GOAL #3   Title Patient will score at least 110 on FACIT-F with all sections answered    Time 8   Period Weeks   Status New   PT LONG TERM GOAL #4   Title Patient will demonstrate improved functional activity tolerance as evidenced by an abiltiy to regularly participate in functional exercise such as swimming for at least 45 minutes, 3 times a week    Time [place] - 04/09/15 1619    Clinical Impression Statement Patient presents with pain in bilateral lower extremities, reduced muscle strength in bilateral LEs and proximal muscles, bilateral hip and ankle stiffness, gait and postural deviations, reduced functional activity tolerance, and reduced functional task performance skills. The patient states that her pain comes and goes, and cannot predict when it is going to come. She also states that she does get  edema in bilateral ankles at times. Patient will benefit from skilled PT servicesi n order to address her deficits and assist her in reaching an optimal level of function.    Pt will benefit from skilled therapeutic intervention in order to improve on the following deficits Abnormal gait;Hypomobility;Decreased activity tolerance;Decreased strength;Pain;Decreased balance;Decreased mobility;Improper body mechanics;Decreased coordination;Difficulty walking;Postural dysfunction;Impaired flexibility   Rehab Potential Good   PT Frequency 2x / week   PT Duration 8 weeks   PT Treatment/Interventions ADLs/Self Care Home Management;Gait training;Stair training;Functional mobility training;Therapeutic activities;Therapeutic exercise;Balance training;Neuromuscular re-education;Patient/family education;Manual techniques   PT Next Visit Plan please give STAR booklet and pedometer; review HEP and goals; functional stretching and strengthening as tolerated    PT Home Exercise Plan given    Consulted and Agree with Plan of Care Patient         Problem List Patient Active Problem List   Diagnosis Date Noted  . Atypical chest pain 03/30/2015  . Special screening for malignant neoplasms, colon 03/30/2015  . Palpitations 03/27/2015  . Hyperlipidemia LDL goal <100 07/15/2014  . Need for prophylactic vaccination and inoculation against influenza 07/15/2014  . GERD (gastroesophageal reflux disease) 10/09/2013  . IBS (irritable bowel syndrome) 10/09/2013  . GAD (generalized anxiety disorder) 05/14/2013  . Metabolic syndrome X 93/90/3009  .  HTN, goal below 130/80 01/09/2013  . Nicotine dependence 01/09/2013  . Insomnia 08/14/2012  . Prediabetes 04/16/2012  . Routine general medical examination at a health care facility 04/16/2012  . Chest pain, atypical 11/29/2011  . Adrenal adenoma 08/06/2011  . Meniere's disease 04/22/2011  . Sleep apnea 04/22/2011  . Breast cancer 12/07/2010  . THYROID MASS 09/18/2010   . ABNORMAL THYROID FUNCTION TESTS 08/17/2010  . Hypothyroidism 01/15/2010  . Vitamin D deficiency 01/15/2010  . Morbid obesity 01/01/2010  . IBS 01/01/2010  . BREAST CANCER, HX OF 01/01/2010    Deniece Ree PT, DPT [facility] [address] [place], [place], [address] Phone: [phone]   Fax:  [phone]

## 2015-04-11 ENCOUNTER — Encounter: Payer: Self-pay | Admitting: Family Medicine

## 2015-04-16 ENCOUNTER — Encounter (HOSPITAL_COMMUNITY)
Admission: RE | Admit: 2015-04-16 | Discharge: 2015-04-16 | Disposition: A | Discharge status: Home / Self Care | Payer: BLUE CROSS/BLUE SHIELD | Source: Ambulatory Visit | Attending: Cardiovascular Disease | Admitting: Cardiovascular Disease

## 2015-04-16 ENCOUNTER — Inpatient Hospital Stay (HOSPITAL_COMMUNITY): Admission: RE | Admit: 2015-04-16 | Payer: BLUE CROSS/BLUE SHIELD | Source: Ambulatory Visit

## 2015-04-16 ENCOUNTER — Encounter (HOSPITAL_COMMUNITY): Payer: Self-pay

## 2015-04-16 DIAGNOSIS — R079 Chest pain, unspecified: Secondary | ICD-10-CM | POA: Diagnosis not present

## 2015-04-16 DIAGNOSIS — Z716 Tobacco abuse counseling: Secondary | ICD-10-CM | POA: Diagnosis not present

## 2015-04-16 LAB — NM MYOCAR MULTI W/SPECT W/WALL MOTION / EF
LV dias vol: 68 mL
LV sys vol: 11 mL
Peak HR: 111 {beats}/min
RATE: 0.69
Rest HR: 67 {beats}/min
SDS: 5
SRS: 3
SSS: 8
TID: 1.23

## 2015-04-16 MED ORDER — TECHNETIUM TC 99M SESTAMIBI GENERIC - CARDIOLITE
10.0000 | Freq: Once | INTRAVENOUS | Status: AC | PRN
  Administered 2015-04-16: 10 via INTRAVENOUS

## 2015-04-16 MED ORDER — REGADENOSON 0.4 MG/5ML IV SOLN
INTRAVENOUS | Status: AC
  Administered 2015-04-16: 0.4 mg via INTRAVENOUS
  Filled 2015-04-16: qty 5

## 2015-04-16 MED ORDER — SODIUM CHLORIDE 0.9 % IJ SOLN
INTRAMUSCULAR | Status: AC
  Administered 2015-04-16: 10 mL via INTRAVENOUS
  Filled 2015-04-16: qty 3

## 2015-04-16 MED ORDER — TECHNETIUM TC 99M SESTAMIBI - CARDIOLITE
30.0000 | Freq: Once | INTRAVENOUS | Status: AC | PRN
  Administered 2015-04-16: 30 via INTRAVENOUS

## 2015-04-18 ENCOUNTER — Ambulatory Visit (INDEPENDENT_AMBULATORY_CARE_PROVIDER_SITE_OTHER): Payer: BLUE CROSS/BLUE SHIELD

## 2015-04-18 DIAGNOSIS — R002 Palpitations: Secondary | ICD-10-CM | POA: Diagnosis not present

## 2015-04-22 ENCOUNTER — Telehealth: Payer: Self-pay | Admitting: *Deleted

## 2015-04-22 NOTE — Telephone Encounter (Signed)
Notes Recorded by Laurine Blazer, LPN on 01/26/9562 at 8:75 PM Patient notified. Results fwd to pmd. Already has follow up scheduled for 05/21/2015 with Dr. Bronson Ing.

## 2015-04-22 NOTE — Telephone Encounter (Signed)
-----   Message from Herminio Commons, MD sent at 04/16/2015  2:58 PM EDT ----- Normal.

## 2015-04-23 ENCOUNTER — Ambulatory Visit (HOSPITAL_COMMUNITY): Payer: BLUE CROSS/BLUE SHIELD | Admitting: Physical Therapy

## 2015-04-23 DIAGNOSIS — R2681 Unsteadiness on feet: Secondary | ICD-10-CM

## 2015-04-23 DIAGNOSIS — Z853 Personal history of malignant neoplasm of breast: Secondary | ICD-10-CM

## 2015-04-23 DIAGNOSIS — R6889 Other general symptoms and signs: Secondary | ICD-10-CM

## 2015-04-23 DIAGNOSIS — M79604 Pain in right leg: Secondary | ICD-10-CM

## 2015-04-23 DIAGNOSIS — M79605 Pain in left leg: Secondary | ICD-10-CM

## 2015-04-23 DIAGNOSIS — R29898 Other symptoms and signs involving the musculoskeletal system: Secondary | ICD-10-CM

## 2015-04-23 NOTE — Therapy (Signed)
[place], [place], [address] Phone: [phone]   Fax:  [phone]  Physical Therapy Treatment  Patient Details  Name: Annette [name] MRN: [id number] Date of Birth: 10-27-1960 Referring Provider:  Patrici Ranks, MD  Encounter Date: 04/23/2015      PT End of Session - 04/23/15 1233    Visit Number 2   Number of Visits 16   Date for PT Re-Evaluation 05/07/15   Authorization Type [employer] Anthem (visit limit 60 combined OT/PT)   Authorization Time Period 04/09/15 to 06/10/15   PT Start Time 1150   PT Stop Time 1228   PT Time Calculation (min) 38 min   Activity Tolerance Patient tolerated treatment well   Behavior During Therapy [facility] for tasks assessed/performed      Past Medical History  Diagnosis Date  . Sleep apnea 2010    Problems with CPAP  . GERD (gastroesophageal reflux disease)   . Menieres disease     Controlled with triamterene   . Pituitary insufficiency   . IBS (irritable bowel syndrome)   . Depression   . Obesity   . Migraines   . Palpitations   . Bruises easily   . Breast cancer 2007    History of  XRT, onTamoxifen  . Diabetes mellitus     prediabetic  . Hypothyroidism     following chemo amnd radiaition for breast cancer, needed replacement short term  . Hypothyroidism (acquired)     replaced x 1 year    Past Surgical History  Procedure Laterality Date  . Thyroidectomy, partial  11/05/2010    Benign disease  . Appendectomy  06/12/08  . Cholecystectomy  1985  . Breast surgery  2007    Left lumpectomy  . Breast reconstruction      Left reconstructive  . Breast surgery      Mammosite - right side  . Abdominal hysterectomy  1998    Benign, fibroids  . Adrenalectomy  02/16/2012    [facility], splemic trauma, 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  . 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  Visit  Diagnosis:  History of breast cancer  Weakness of both legs  Decreased functional activity tolerance  Unsteadiness  Pain of left lower extremity  Pain of right lower extremity      Subjective Assessment - 04/23/15 1152    Subjective Patient reports that her hips are still hurting; had some pain in her knee trying to get off of floor from HEP but chiropractor fixed it and HEP is going much better now    Pertinent History Diagnosed with breast cancer in 2007; since then has had pain on a daily basis, mostly in legs but sometimes in elbows too. Pain peaks and goes away, not really predictable but hips do ache on a daily basis.    Currently in Pain? Yes   Pain Score 3    Pain Location Other (Comment)  bilateral hips                          [facility] Adult PT Treatment/Exercise - 04/23/15 0001    Knee/Hip Exercises: Stretches   Active Hamstring Stretch Both;3 reps;30 seconds   Active Hamstring Stretch Limitations 12 inch box, 3 way    Piriformis Stretch Both;3 reps;30 seconds   Piriformis Stretch Limitations seated  Gastroc Stretch Both;3 reps;30 seconds   Gastroc Stretch Limitations slantboard    Knee/Hip Exercises: Aerobic   Nustep Nustep level 1 for 8 minutes    Knee/Hip Exercises: Standing   Heel Raises Both;1 set;15 reps   Heel Raises Limitations floor    Forward Lunges Both;1 set;10 reps   Forward Lunges Limitations 4 inch box    Forward Step Up Both;1 set;10 reps   Forward Step Up Limitations 4 inch step    Rocker Board Limitations x20AP, x20 lateral U HHA    Other Standing Knee Exercises Hip ABD and hip extension 1x10 with 2 second holds    Other Standing Knee Exercises 3D hip excursions 1x10 neutral stance                 PT Education - 04/23/15 1233    Education provided Yes   Education Details gave patient STAR manual and pedometer, reviewed initial eval    Person(s) Educated Patient   Methods Explanation;Handout   Comprehension  Verbalized understanding          PT Short Term Goals - 04/09/15 1623    PT SHORT TERM GOAL #1   Title Patient will demosntrate 5/5 strength in bilateral lower extremities and at least 4+/5 strength in proximal musculature, also at least 4-/5 strength in core     Time 4   Period Weeks   Status New   PT SHORT TERM GOAL #2   Title Patient will demonstrate bilateral hip IR of at least 40 degrees to assist in overall mobility and mechanics    Time 4   Period Weeks   Status New   PT SHORT TERM GOAL #3   Title Patient will demonstrate the ability to maintain SLS at least 45 seconds each LE    Time 4   Period Weeks   Status New   PT SHORT TERM GOAL #4   Title Patient to be independent in correctly and consistently performing appropriate HEP, to be updated PRN    Time 4   Period Weeks   Status New           PT Long Term Goals - 04/09/15 1626    PT LONG TERM GOAL #1   Title Patient will consistently be able to stand for at least 60 minutes with pain 0/10  to facilitate functional task performance such as cooking or home maintenance    Time 8   Period Weeks   Status New   PT LONG TERM GOAL #2   Title Patient will consistently be able to ambulate for at least 45 minutes without seated rest in order to increase ease of functional activities such as going to the store    Time 8   Period Weeks   Status New   PT LONG TERM GOAL #3   Title Patient will score at least 110 on FACIT-F with all sections answered    Time 8   Period Weeks   Status New   PT LONG TERM GOAL #4   Title Patient will demonstrate improved functional activity tolerance as evidenced by an abiltiy to regularly participate in functional exercise such as swimming for at least 45 minutes, 3 times a week    Time 8   Period Weeks   Status New               Plan - 04/23/15 1234    Clinical Impression Statement Introduced functional strength and stretching activities with good tolerance by patient  today.  Patient reports that initially she had some difficulty in doing HEP but that this has gotten much better and she is able to do it with no problems. Reported feeling much better at end of session.    Pt will benefit from skilled therapeutic intervention in order to improve on the following deficits Abnormal gait;Hypomobility;Decreased activity tolerance;Decreased strength;Pain;Decreased balance;Decreased mobility;Improper body mechanics;Decreased coordination;Difficulty walking;Postural dysfunction;Impaired flexibility   Rehab Potential Good   PT Frequency 2x / week   PT Duration 8 weeks   PT Treatment/Interventions ADLs/Self Care Home Management;Gait training;Stair training;Functional mobility training;Therapeutic activities;Therapeutic exercise;Balance training;Neuromuscular re-education;Patient/family education;Manual techniques   PT Next Visit Plan Continue functional strength and stretching, introduce balance work    PT Home Exercise Plan given    Consulted and Agree with Plan of Care Patient        Problem List Patient Active Problem List   Diagnosis Date Noted  . Atypical chest pain 03/30/2015  . Special screening for malignant neoplasms, colon 03/30/2015  . Palpitations 03/27/2015  . Hyperlipidemia LDL goal <100 07/15/2014  . Need for prophylactic vaccination and inoculation against influenza 07/15/2014  . GERD (gastroesophageal reflux disease) 10/09/2013  . IBS (irritable bowel syndrome) 10/09/2013  . GAD (generalized anxiety disorder) 05/14/2013  . Metabolic syndrome X 01/77/9390  . HTN, goal below 130/80 01/09/2013  . Nicotine dependence 01/09/2013  . Insomnia 08/14/2012  . Prediabetes 04/16/2012  . Routine general medical examination at a health care facility 04/16/2012  . Chest pain, atypical 11/29/2011  . Adrenal adenoma 08/06/2011  . Meniere's disease 04/22/2011  . Sleep apnea 04/22/2011  . Breast cancer 12/07/2010  . THYROID MASS 09/18/2010  . ABNORMAL THYROID  FUNCTION TESTS 08/17/2010  . Hypothyroidism 01/15/2010  . Vitamin D deficiency 01/15/2010  . Morbid obesity 01/01/2010  . IBS 01/01/2010  . BREAST CANCER, HX OF 01/01/2010    Deniece Ree PT, DPT [place] [address] [place], [place], [address] Phone: [phone]   Fax:  [phone]

## 2015-04-26 ENCOUNTER — Other Ambulatory Visit: Payer: Self-pay | Admitting: Family Medicine

## 2015-04-28 ENCOUNTER — Telehealth (HOSPITAL_COMMUNITY): Payer: Self-pay | Admitting: Physical Therapy

## 2015-04-28 ENCOUNTER — Ambulatory Visit (HOSPITAL_COMMUNITY): Payer: BLUE CROSS/BLUE SHIELD | Admitting: Physical Therapy

## 2015-04-28 NOTE — Telephone Encounter (Signed)
Patient a no-show for today's appointment. Called and spoke to the patient, who reported that her father in law had passed away and that she had gotten caught up in everything and had forgotten she had an appointment today. Reminded patient of time and date of next appointment.  Deniece Ree PT, DPT [phone]

## 2015-04-30 ENCOUNTER — Ambulatory Visit (HOSPITAL_COMMUNITY): Payer: BLUE CROSS/BLUE SHIELD

## 2015-04-30 DIAGNOSIS — Z853 Personal history of malignant neoplasm of breast: Secondary | ICD-10-CM | POA: Diagnosis not present

## 2015-04-30 DIAGNOSIS — R6889 Other general symptoms and signs: Secondary | ICD-10-CM

## 2015-04-30 DIAGNOSIS — R29898 Other symptoms and signs involving the musculoskeletal system: Secondary | ICD-10-CM

## 2015-04-30 DIAGNOSIS — M79604 Pain in right leg: Secondary | ICD-10-CM

## 2015-04-30 DIAGNOSIS — M79605 Pain in left leg: Secondary | ICD-10-CM

## 2015-04-30 DIAGNOSIS — R2681 Unsteadiness on feet: Secondary | ICD-10-CM

## 2015-04-30 NOTE — Therapy (Signed)
[place], [place], [address] Phone: [phone]   Fax:  [phone]  Physical Therapy Treatment  Patient Details  Name: Annette [name] MRN: [id number] Date of Birth: 01-05-1961 Referring Provider:  Patrici Ranks, MD  Encounter Date: 04/30/2015      PT End of Session - 04/30/15 1535    Visit Number 3   Number of Visits 16   Date for PT Re-Evaluation 05/07/15   Authorization Type [employer] Anthem (visit limit 60 combined OT/PT)   Authorization Time Period 04/09/15 to 06/10/15   PT Start Time 1522   PT Stop Time 1610   PT Time Calculation (min) 48 min   Activity Tolerance Patient tolerated treatment well   Behavior During Therapy [facility] for tasks assessed/performed      Past Medical History  Diagnosis Date  . Sleep apnea 2010    Problems with CPAP  . GERD (gastroesophageal reflux disease)   . Menieres disease     Controlled with triamterene   . Pituitary insufficiency   . IBS (irritable bowel syndrome)   . Depression   . Obesity   . Migraines   . Palpitations   . Bruises easily   . Breast cancer 2007    History of  XRT, onTamoxifen  . Diabetes mellitus     prediabetic  . Hypothyroidism     following chemo amnd radiaition for breast cancer, needed replacement short term  . Hypothyroidism (acquired)     replaced x 1 year    Past Surgical History  Procedure Laterality Date  . Thyroidectomy, partial  11/05/2010    Benign disease  . Appendectomy  06/12/08  . Cholecystectomy  1985  . Breast surgery  2007    Left lumpectomy  . Breast reconstruction      Left reconstructive  . Breast surgery      Mammosite - right side  . Abdominal hysterectomy  1998    Benign, fibroids  . Adrenalectomy  02/16/2012    [facility], splemic trauma, 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  . Splenectomy, total  5/36/1443    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    There were no vitals filed for this visit.  Visit  Diagnosis:  History of breast cancer  Weakness of both legs  Decreased functional activity tolerance  Unsteadiness  Pain of left lower extremity  Pain of right lower extremity      Subjective Assessment - 04/30/15 1531    Subjective Pt stated she has been unable to complete exercises this week due to loss in family.  Reports Bil hip are achey today, pain scale 2/10   Currently in Pain? Yes   Pain Score 2    Pain Location Hip   Pain Orientation Right;Left   Pain Descriptors / Indicators Aching             [facility] Adult PT Treatment/Exercise - 04/30/15 0001    Exercises   Exercises Knee/Hip   Knee/Hip Exercises: Aerobic   Nustep Nustep level 3 for 10 minutes    Knee/Hip Exercises: Standing   Heel Raises Both;1 set;15 reps   Heel Raises Limitations Toe raises 15x   Forward Lunges Both;1 set;10 reps   Forward Lunges Limitations 4 inch box    Forward Step Up Both;1 set;10 reps;Step Height: 6"   Functional Squat 10 reps   Functional Squat Limitations 3D hip excursion   Rocker Board 2 minutes   Rocker Board Limitations R/L and A/P  SLS Lt 42", Rt 43 max of    SLS with Vectors 1x 5" Bil LE   Other Standing Knee Exercises 3x 30" tandem stance on airex            PT Short Term Goals - 04/30/15 1618    PT SHORT TERM GOAL #1   Title Patient will demosntrate 5/5 strength in bilateral lower extremities and at least 4+/5 strength in proximal musculature, also at least 4-/5 strength in core     Status On-going   PT SHORT TERM GOAL #2   Title Patient will demonstrate bilateral hip IR of at least 40 degrees to assist in overall mobility and mechanics    PT SHORT TERM GOAL #3   Title Patient will demonstrate the ability to maintain SLS at least 45 seconds each LE    Status On-going   PT SHORT TERM GOAL #4   Title Patient to be independent in correctly and consistently performing appropriate HEP, to be updated PRN    Status On-going           PT Long Term Goals -  04/30/15 1618    PT LONG TERM GOAL #1   Title Patient will consistently be able to stand for at least 60 minutes with pain 0/10  to facilitate functional task performance such as cooking or home maintenance    PT LONG TERM GOAL #2   Title Patient will consistently be able to ambulate for at least 45 minutes without seated rest in order to increase ease of functional activities such as going to the store    PT [place] #3   Title Patient will score at least 110 on FACIT-F with all sections answered    PT LONG TERM GOAL #4   Title Patient will demonstrate improved functional activity tolerance as evidenced by an abiltiy to regularly participate in functional exercise such as swimming for at least 45 minutes, 3 times a week                Plan - 04/30/15 1609    Clinical Impression Statement Continued functional strengthening activities with ability to demonstrate appropriate technique with all exercises following cueing for form and technqiue.  Added balance activities this session with ability to SLS 43" Bil LE, progressed to vector stance with ability to complete without difficutly though did report increased tenderness Bil hips.  Began tandem stance on Airex with SBA required and cueing to improve spatial awareness with improved stability with exercise.  Increase time and resistance on Nustep for strengthening and activity tolerance, pt able to tolerate with reports of pain reduced at end of session.     PT Next Visit Plan Continue functional strength and stretching, continue balance work         Problem List Patient Active Problem List   Diagnosis Date Noted  . Atypical chest pain 03/30/2015  . Special screening for malignant neoplasms, colon 03/30/2015  . Palpitations 03/27/2015  . Hyperlipidemia LDL goal <100 07/15/2014  . Need for prophylactic vaccination and inoculation against influenza 07/15/2014  . GERD (gastroesophageal reflux disease) 10/09/2013  . IBS (irritable  bowel syndrome) 10/09/2013  . GAD (generalized anxiety disorder) 05/14/2013  . Metabolic syndrome X 28/41/3244  . HTN, goal below 130/80 01/09/2013  . Nicotine dependence 01/09/2013  . Insomnia 08/14/2012  . Prediabetes 04/16/2012  . Routine general medical examination at a health care facility 04/16/2012  . Chest pain, atypical 11/29/2011  . Adrenal adenoma 08/06/2011  .  Meniere's disease 04/22/2011  . Sleep apnea 04/22/2011  . Breast cancer 12/07/2010  . THYROID MASS 09/18/2010  . ABNORMAL THYROID FUNCTION TESTS 08/17/2010  . Hypothyroidism 01/15/2010  . Vitamin D deficiency 01/15/2010  . Morbid obesity 01/01/2010  . IBS 01/01/2010  . BREAST CANCER, HX OF 01/01/2010   Ihor Austin, [place]; [place]  Aldona Lento 04/30/2015, 4:21 PM  [place] [address] [place], [place], [address] Phone: [phone]   Fax:  [phone]

## 2015-05-02 ENCOUNTER — Ambulatory Visit (HOSPITAL_COMMUNITY): Payer: BLUE CROSS/BLUE SHIELD

## 2015-05-02 DIAGNOSIS — Z853 Personal history of malignant neoplasm of breast: Secondary | ICD-10-CM | POA: Diagnosis not present

## 2015-05-02 DIAGNOSIS — M79605 Pain in left leg: Secondary | ICD-10-CM

## 2015-05-02 DIAGNOSIS — R29898 Other symptoms and signs involving the musculoskeletal system: Secondary | ICD-10-CM

## 2015-05-02 DIAGNOSIS — R2681 Unsteadiness on feet: Secondary | ICD-10-CM

## 2015-05-02 DIAGNOSIS — R6889 Other general symptoms and signs: Secondary | ICD-10-CM

## 2015-05-02 DIAGNOSIS — M79604 Pain in right leg: Secondary | ICD-10-CM

## 2015-05-02 NOTE — Therapy (Signed)
[place], [place], [address] Phone: [phone]   Fax:  [phone]  Physical Therapy Treatment  Patient Details  Name: Annette [name] MRN: [id number] Date of Birth: 1961-02-21 Referring Provider:  Patrici Ranks, MD  Encounter Date: 05/02/2015      PT End of Session - 05/02/15 1514    Visit Number 4   Number of Visits 16   Date for PT Re-Evaluation 05/07/15   Authorization Type [employer] Anthem (visit limit 60 combined OT/PT)   Authorization Time Period 04/09/15 to 06/10/15   PT Start Time 1436   PT Stop Time 1522   PT Time Calculation (min) 46 min   Activity Tolerance Patient tolerated treatment well   Behavior During Therapy [facility] for tasks assessed/performed      Past Medical History  Diagnosis Date  . Sleep apnea 2010    Problems with CPAP  . GERD (gastroesophageal reflux disease)   . Menieres disease     Controlled with triamterene   . Pituitary insufficiency   . IBS (irritable bowel syndrome)   . Depression   . Obesity   . Migraines   . Palpitations   . Bruises easily   . Breast cancer 2007    History of  XRT, onTamoxifen  . Diabetes mellitus     prediabetic  . Hypothyroidism     following chemo amnd radiaition for breast cancer, needed replacement short term  . Hypothyroidism (acquired)     replaced x 1 year    Past Surgical History  Procedure Laterality Date  . Thyroidectomy, partial  11/05/2010    Benign disease  . Appendectomy  06/12/08  . Cholecystectomy  1985  . Breast surgery  2007    Left lumpectomy  . Breast reconstruction      Left reconstructive  . Breast surgery      Mammosite - right side  . Abdominal hysterectomy  1998    Benign, fibroids  . Adrenalectomy  02/16/2012    [facility], splemic trauma, 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  . Splenectomy, total  5/69/7948    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    There were no vitals filed for this visit.  Visit  Diagnosis:  History of breast cancer  Weakness of both legs  Decreased functional activity tolerance  Unsteadiness  Pain of left lower extremity  Pain of right lower extremity      Subjective Assessment - 05/02/15 1440    Subjective Pt stated her thighs were sore today, no real reports of pain.   Currently in Pain? No/denies   Pain Descriptors / Indicators Sore            [facility] PT Assessment - 05/02/15 0001    Assessment   Medical Diagnosis breast cancer    Onset Date/Surgical Date --  2007   Next MD Visit October 19th with Dr. Moshe Cipro; follow up with [facility] in 2017   Precautions   Precautions None          [facility] Adult PT Treatment/Exercise - 05/02/15 0001    Exercises   Exercises Knee/Hip   Knee/Hip Exercises: Stretches   Active Hamstring Stretch Both;3 reps;30 seconds   Active Hamstring Stretch Limitations 12 inch box, 3 way    Quad Stretch 3 reps;30 seconds   Quad Stretch Limitations prone with rope   Gastroc Stretch Both;3 reps;30 seconds   Gastroc Stretch Limitations slantboard    Knee/Hip Exercises: Aerobic   Nustep Nustep level  3 for 10 minutes    Knee/Hip Exercises: Standing   Heel Raises Both;1 set;15 reps   Heel Raises Limitations Toe raises 15x   Forward Lunges Both;1 set;10 reps   Forward Lunges Limitations 4 inch box    Lateral Step Up Both;10 reps;Hand Hold: 1;Step Height: 6"   Forward Step Up Both;1 set;10 reps;Step Height: 6"   Step Down Both;10 reps;Hand Hold: 1;Step Height: 6"   Functional Squat 10 reps   Functional Squat Limitations 3D hip excursion   SLS Lt 31", Rt 28" on Airex    SLS with Vectors 2x 5" Bil LE   Other Standing Knee Exercises Tandem gait 1RT SBA   Other Standing Knee Exercises 3x 30" tandem stance on airex             PT Short Term Goals - 04/30/15 1618    PT SHORT TERM GOAL #1   Title Patient will demosntrate 5/5 strength in bilateral lower extremities and at least 4+/5 strength in proximal musculature, also  at least 4-/5 strength in core     Status On-going   PT SHORT TERM GOAL #2   Title Patient will demonstrate bilateral hip IR of at least 40 degrees to assist in overall mobility and mechanics    PT SHORT TERM GOAL #3   Title Patient will demonstrate the ability to maintain SLS at least 45 seconds each LE    Status On-going   PT SHORT TERM GOAL #4   Title Patient to be independent in correctly and consistently performing appropriate HEP, to be updated PRN    Status On-going           PT Long Term Goals - 04/30/15 1618    PT LONG TERM GOAL #1   Title Patient will consistently be able to stand for at least 60 minutes with pain 0/10  to facilitate functional task performance such as cooking or home maintenance    PT LONG TERM GOAL #2   Title Patient will consistently be able to ambulate for at least 45 minutes without seated rest in order to increase ease of functional activities such as going to the store    PT [place] #3   Title Patient will score at least 110 on FACIT-F with all sections answered    PT LONG TERM GOAL #4   Title Patient will demonstrate improved functional activity tolerance as evidenced by an abiltiy to regularly participate in functional exercise such as swimming for at least 45 minutes, 3 times a week                Plan - 05/02/15 1517    Clinical Impression Statement Pt progressing well with functional strengthening activities and balance.  Added quad stretches following reports of increased soreness Bil thighs.  Progressed stair training with 6in height and dynamic surface with balance activities.  Began tandem stance with SBA and ability to recover LOB episodes.  No reports of pain through session.   PT Next Visit Plan Continue functional strength and stretching, continue balance work   Begin balance beam activities next sessin.        Problem List Patient Active Problem List   Diagnosis Date Noted  . Atypical chest pain 03/30/2015  .  Special screening for malignant neoplasms, colon 03/30/2015  . Palpitations 03/27/2015  . Hyperlipidemia LDL goal <100 07/15/2014  . Need for prophylactic vaccination and inoculation against influenza 07/15/2014  . GERD (gastroesophageal reflux disease) 10/09/2013  . IBS (  irritable bowel syndrome) 10/09/2013  . GAD (generalized anxiety disorder) 05/14/2013  . Metabolic syndrome X 10/12/3233  . HTN, goal below 130/80 01/09/2013  . Nicotine dependence 01/09/2013  . Insomnia 08/14/2012  . Prediabetes 04/16/2012  . Routine general medical examination at a health care facility 04/16/2012  . Chest pain, atypical 11/29/2011  . Adrenal adenoma 08/06/2011  . Meniere's disease 04/22/2011  . Sleep apnea 04/22/2011  . Breast cancer 12/07/2010  . THYROID MASS 09/18/2010  . ABNORMAL THYROID FUNCTION TESTS 08/17/2010  . Hypothyroidism 01/15/2010  . Vitamin D deficiency 01/15/2010  . Morbid obesity 01/01/2010  . IBS 01/01/2010  . BREAST CANCER, HX OF 01/01/2010   Ihor Austin, [place]; [place]  Aldona Lento 05/02/2015, 5:13 PM  [place], [place], [address] Phone: [phone]   Fax:  [phone]

## 2015-05-06 ENCOUNTER — Ambulatory Visit (HOSPITAL_COMMUNITY): Payer: BLUE CROSS/BLUE SHIELD

## 2015-05-08 ENCOUNTER — Ambulatory Visit (HOSPITAL_COMMUNITY): Payer: BLUE CROSS/BLUE SHIELD | Admitting: Physical Therapy

## 2015-05-13 ENCOUNTER — Telehealth: Payer: Self-pay | Admitting: *Deleted

## 2015-05-13 ENCOUNTER — Ambulatory Visit (HOSPITAL_COMMUNITY): Payer: BLUE CROSS/BLUE SHIELD | Attending: Hematology & Oncology | Admitting: Physical Therapy

## 2015-05-13 DIAGNOSIS — R6889 Other general symptoms and signs: Secondary | ICD-10-CM | POA: Diagnosis present

## 2015-05-13 DIAGNOSIS — R2681 Unsteadiness on feet: Secondary | ICD-10-CM

## 2015-05-13 DIAGNOSIS — M79605 Pain in left leg: Secondary | ICD-10-CM | POA: Diagnosis present

## 2015-05-13 DIAGNOSIS — Z853 Personal history of malignant neoplasm of breast: Secondary | ICD-10-CM

## 2015-05-13 DIAGNOSIS — R29898 Other symptoms and signs involving the musculoskeletal system: Secondary | ICD-10-CM

## 2015-05-13 DIAGNOSIS — M79604 Pain in right leg: Secondary | ICD-10-CM | POA: Diagnosis present

## 2015-05-13 NOTE — Telephone Encounter (Signed)
Sinus rhythm with one PAC.        ----- Message -----     From: Merlene Laughter, LPN     Sent: 12/10/1827  4:13 PM      To: Herminio Commons, MD    Subject: Import                            The document below was attached by Merlene Laughter, LPN [[id number]] on 05/09/2015 at 4:13 PM to the following: Cardiac event monitor on 04/18/2015.

## 2015-05-13 NOTE — Telephone Encounter (Signed)
Patient informed. 

## 2015-05-13 NOTE — Therapy (Signed)
[place], [place], [address] Phone: [phone]   Fax:  [phone]  Physical Therapy Treatment  Patient Details  Name: Annette [name] MRN: [id number] Date of Birth: [birth year] Referring Provider:  Patrici Ranks, MD  Encounter Date: 05/13/2015      PT End of Session - 05/13/15 1443    Visit Number 5   Number of Visits 16   Date for PT Re-Evaluation 05/07/15   Authorization Type [employer] Anthem (visit limit 60 combined OT/PT)   Authorization Time Period 04/09/15 to 06/10/15   PT Start Time 1300   PT Stop Time 1345   PT Time Calculation (min) 45 min   Activity Tolerance Patient tolerated treatment well   Behavior During Therapy [facility] for tasks assessed/performed      Past Medical History  Diagnosis Date  . Sleep apnea 2010    Problems with CPAP  . GERD (gastroesophageal reflux disease)   . Menieres disease     Controlled with triamterene   . Pituitary insufficiency   . IBS (irritable bowel syndrome)   . Depression   . Obesity   . Migraines   . Palpitations   . Bruises easily   . Breast cancer 2007    History of  XRT, onTamoxifen  . Diabetes mellitus     prediabetic  . Hypothyroidism     following chemo amnd radiaition for breast cancer, needed replacement short term  . Hypothyroidism (acquired)     replaced x 1 year    Past Surgical History  Procedure Laterality Date  . Thyroidectomy, partial  11/05/2010    Benign disease  . Appendectomy  06/12/08  . Cholecystectomy  1985  . Breast surgery  2007    Left lumpectomy  . Breast reconstruction      Left reconstructive  . Breast surgery      Mammosite - right side  . Abdominal hysterectomy  1998    Benign, fibroids  . Adrenalectomy  02/16/2012    [facility], splemic trauma, 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  . Splenectomy, total  1/54/0086    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    There were no vitals filed for this visit.  Visit  Diagnosis:  History of breast cancer  Weakness of both legs  Decreased functional activity tolerance  Unsteadiness  Pain of left lower extremity  Pain of right lower extremity      Subjective Assessment - 05/13/15 1501    Subjective Pt reports she is painfree today.   Currently in Pain? No/denies                         [facility] Adult PT Treatment/Exercise - 05/13/15 1307    High Level Balance   High Level Balance Activities Tandem walking  balance beam 2RT   High Level Balance Comments hurdles 6" and 12" 1RT reciprocally   Knee/Hip Exercises: Stretches   Active Hamstring Stretch Both;2 reps;30 seconds   Active Hamstring Stretch Limitations 12 inch box, 3 way    Knee/Hip Exercises: Aerobic   Nustep Nustep level 3, hills #3  for 10 minutes    Knee/Hip Exercises: Standing   Heel Raises Both;1 set;15 reps   Heel Raises Limitations no UE's; Toe raises 15x   Forward Lunges Both;1 set;15 reps   Forward Lunges Limitations 4 inch box    Side Lunges Both;10 reps   Side Lunges Limitations 4 inch box   Lateral Step  Up Both;10 reps;Hand Hold: 1;Step Height: 6"   Lateral Step Up Limitations 7" staircase   Forward Step Up Both;1 set;10 reps;Step Height: 6"   Forward Step Up Limitations 7" staircase   Step Down Both;10 reps;Hand Hold: 1;Step Height: 4"   Functional Squat 15 reps   SLS with Vectors 2x 5" Bil LE   Other Standing Knee Exercises side step sumo walk with red theraband 2RT                  PT Short Term Goals - 04/30/15 1618    PT SHORT TERM GOAL #1   Title Patient will demosntrate 5/5 strength in bilateral lower extremities and at least 4+/5 strength in proximal musculature, also at least 4-/5 strength in core     Status On-going   PT SHORT TERM GOAL #2   Title Patient will demonstrate bilateral hip IR of at least 40 degrees to assist in overall mobility and mechanics    PT SHORT TERM GOAL #3   Title Patient will demonstrate the ability to  maintain SLS at least 45 seconds each LE    Status On-going   PT SHORT TERM GOAL #4   Title Patient to be independent in correctly and consistently performing appropriate HEP, to be updated PRN    Status On-going           PT Long Term Goals - 04/30/15 1618    PT LONG TERM GOAL #1   Title Patient will consistently be able to stand for at least 60 minutes with pain 0/10  to facilitate functional task performance such as cooking or home maintenance    PT LONG TERM GOAL #2   Title Patient will consistently be able to ambulate for at least 45 minutes without seated rest in order to increase ease of functional activities such as going to the store    PT [place] #3   Title Patient will score at least 110 on FACIT-F with all sections answered    PT LONG TERM GOAL #4   Title Patient will demonstrate improved functional activity tolerance as evidenced by an abiltiy to regularly participate in functional exercise such as swimming for at least 45 minutes, 3 times a week                Plan - 05/13/15 1453    Clinical Impression Statement Progressed with balance actvities with baalcne beam providing challenge, hurdles without diffiuculty.  Also added sumo walk with theraband to increase hip abductor strength and stabiltiy.  Pt able to complete all exercises withotu rest break.    PT Next Visit Plan Continue functional strength and stretching, continue to progress balance.        Problem List Patient Active Problem List   Diagnosis Date Noted  . Atypical chest pain 03/30/2015  . Special screening for malignant neoplasms, colon 03/30/2015  . Palpitations 03/27/2015  . Hyperlipidemia LDL goal <100 07/15/2014  . Need for prophylactic vaccination and inoculation against influenza 07/15/2014  . GERD (gastroesophageal reflux disease) 10/09/2013  . IBS (irritable bowel syndrome) 10/09/2013  . GAD (generalized anxiety disorder) 05/14/2013  . Metabolic syndrome X 96/29/5284  . HTN,  goal below 130/80 01/09/2013  . Nicotine dependence 01/09/2013  . Insomnia 08/14/2012  . Prediabetes 04/16/2012  . Routine general medical examination at a health care facility 04/16/2012  . Chest pain, atypical 11/29/2011  . Adrenal adenoma 08/06/2011  . Meniere's disease 04/22/2011  . Sleep apnea 04/22/2011  .  Breast cancer 12/07/2010  . THYROID MASS 09/18/2010  . ABNORMAL THYROID FUNCTION TESTS 08/17/2010  . Hypothyroidism 01/15/2010  . Vitamin D deficiency 01/15/2010  . Morbid obesity 01/01/2010  . IBS 01/01/2010  . BREAST CANCER, HX OF 01/01/2010    Teena Irani, PTA/CLT [phone]  05/13/2015, 3:01 PM  [place] [address] [place], [place], [address] Phone: [phone]   Fax:  [phone]

## 2015-05-15 ENCOUNTER — Other Ambulatory Visit: Payer: Self-pay | Admitting: Family Medicine

## 2015-05-15 ENCOUNTER — Ambulatory Visit (HOSPITAL_COMMUNITY): Payer: BLUE CROSS/BLUE SHIELD | Admitting: Physical Therapy

## 2015-05-15 DIAGNOSIS — M79605 Pain in left leg: Secondary | ICD-10-CM

## 2015-05-15 DIAGNOSIS — Z853 Personal history of malignant neoplasm of breast: Secondary | ICD-10-CM

## 2015-05-15 DIAGNOSIS — M79604 Pain in right leg: Secondary | ICD-10-CM

## 2015-05-15 DIAGNOSIS — R6889 Other general symptoms and signs: Secondary | ICD-10-CM

## 2015-05-15 DIAGNOSIS — R29898 Other symptoms and signs involving the musculoskeletal system: Secondary | ICD-10-CM

## 2015-05-15 DIAGNOSIS — R2681 Unsteadiness on feet: Secondary | ICD-10-CM

## 2015-05-15 NOTE — Therapy (Signed)
[place], [place], [address] Phone: [phone]   Fax:  [phone]  Physical Therapy Treatment  Patient Details  Name: Annette [name] MRN: [id number] Date of Birth: [birth year] Referring Provider:  Patrici Ranks, MD  Encounter Date: 05/15/2015      PT End of Session - 05/15/15 1340    Visit Number 6   Number of Visits 16   Date for PT Re-Evaluation 06/12/15   Authorization Type [employer] Anthem (visit limit 60 combined OT/PT)   Authorization Time Period 04/09/15 to 06/10/15   Activity Tolerance Patient tolerated treatment well   Behavior During Therapy [facility] for tasks assessed/performed      Past Medical History  Diagnosis Date  . Sleep apnea 2010    Problems with CPAP  . GERD (gastroesophageal reflux disease)   . Menieres disease     Controlled with triamterene   . Pituitary insufficiency   . IBS (irritable bowel syndrome)   . Depression   . Obesity   . Migraines   . Palpitations   . Bruises easily   . Breast cancer 2007    History of  XRT, onTamoxifen  . Diabetes mellitus     prediabetic  . Hypothyroidism     following chemo amnd radiaition for breast cancer, needed replacement short term  . Hypothyroidism (acquired)     replaced x 1 year    Past Surgical History  Procedure Laterality Date  . Thyroidectomy, partial  11/05/2010    Benign disease  . Appendectomy  06/12/08  . Cholecystectomy  1985  . Breast surgery  2007    Left lumpectomy  . Breast reconstruction      Left reconstructive  . Breast surgery      Mammosite - right side  . Abdominal hysterectomy  1998    Benign, fibroids  . Adrenalectomy  02/16/2012    [facility], splemic trauma, 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  . Splenectomy, total  2/67/1245    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    There were no vitals filed for this visit.  Visit Diagnosis:  History of breast cancer  Weakness of both legs  Decreased  functional activity tolerance  Unsteadiness  Pain of left lower extremity  Pain of right lower extremity      Subjective Assessment - 05/15/15 1304    Subjective Patient reports that she is only having minimal pain today, trying to figure out how to spend as much time with her mom in [place] this summer    Pertinent History Diagnosed with breast cancer in 2007; since then has had pain on a daily basis, mostly in legs but sometimes in elbows too. Pain peaks and goes away, not really predictable but hips do ache on a daily basis.    How long can you stand comfortably? 8/11- 20 minutes    How long can you walk comfortably? 8/11- reports pedometer is around 3000 steps, maybe around 10-15 minutes    Patient Stated Goals get rid of pain    Pain Score 1    Pain Location Knee  knee and hip    Pain Orientation Left            [facility] PT Assessment - 05/15/15 0001    Observation/Other Assessments   Focus on Therapeutic Outcomes (FOTO)  SLS 25 R, 36 L; TUG 6.7, 6.3, 5.9   AROM   Right Hip Internal Rotation  35   Left Hip Internal Rotation  38   Right Ankle Dorsiflexion 19   Left Ankle Dorsiflexion 13   Strength   Overall Strength Comments lower abs approx 2+/5, upper abs approx 3-/5   Right Hip Flexion 5/5   Right Hip Extension 4+/5   Right Hip ABduction 4-/5   Left Hip Flexion 4+/5   Left Hip Extension 3+/5   Left Hip ABduction 4/5   Right Knee Flexion 5/5   Right Knee Extension 4+/5   Left Knee Flexion 4/5   Left Knee Extension 4+/5   Right Ankle Dorsiflexion 5/5   Left Ankle Dorsiflexion 4+/5                     [facility] Adult PT Treatment/Exercise - 05/15/15 0001    Knee/Hip Exercises: Stretches   Active Hamstring Stretch Both;30 seconds;3 reps   Active Hamstring Stretch Limitations 12 inch box   Piriformis Stretch Both;2 reps;30 seconds   Piriformis Stretch Limitations seated    Gastroc Stretch Both;3 reps;30 seconds   Gastroc Stretch Limitations slantboard     Knee/Hip Exercises: Aerobic   Nustep Nustep level 3, hills #3  for 10 minutes                 PT Education - 05/15/15 1340    Education provided Yes   Education Details plan of care moving forward    Person(s) Educated Patient   Methods Explanation   Comprehension Verbalized understanding          PT Short Term Goals - 05/15/15 1328    PT SHORT TERM GOAL #1   Title Patient will demosntrate 5/5 strength in bilateral lower extremities and at least 4+/5 strength in proximal musculature, also at least 4-/5 strength in core     Time 4   Period Weeks   Status On-going   PT SHORT TERM GOAL #2   Title Patient will demonstrate bilateral hip IR of at least 40 degrees to assist in overall mobility and mechanics    Time 4   Period Weeks   Status On-going   PT SHORT TERM GOAL #3   Title Patient will demonstrate the ability to maintain SLS at least 45 seconds each LE    Time 4   Period Weeks   Status On-going   PT SHORT TERM GOAL #4   Title Patient to be independent in correctly and consistently performing appropriate HEP, to be updated PRN    Baseline 8/11-  doing at least 5 days per week   Time 4   Period Weeks   Status Achieved           PT Long Term Goals - 05/15/15 1332    PT LONG TERM GOAL #1   Title Patient will consistently be able to stand for at least 60 minutes with pain 0/10  to facilitate functional task performance such as cooking or home maintenance    Time 8   Period Weeks   Status On-going   PT LONG TERM GOAL #2   Title Patient will consistently be able to ambulate for at least 45 minutes without seated rest in order to increase ease of functional activities such as going to the store    Time 8   Period Weeks   Status On-going   PT LONG TERM GOAL #3   Title Patient will score at least 110 on FACIT-F with all sections answered    Time 8   Period Weeks   Status On-going   PT LONG  TERM GOAL #4   Title Patient will demonstrate improved functional  activity tolerance as evidenced by an abiltiy to regularly participate in functional exercise such as swimming for at least 45 minutes, 3 times a week    Baseline 8/11- patient reports she is able to remain active in the pool for 3 hours, multiple days per week ; reports she was able to walk for a couple hours in [place] the other week but did have some    Time 8   Period Weeks   Status Achieved               Plan - 05/15/15 1340    Clinical Impression Statement Re-Assessment performed today. Patient shows improvement in all areas, however does continue to demonstrate reduced bilateral lower extremity/proximal muscle/core strength, reduced balance, postural and gait impairment, reduced functional actiivity tolerance, and L hip/knee pain. The patient reports she has been feeling better and has been able to do much more, but continues to have pain in her L hip and knee; may see another MD regarding her knee as it did happen at work years ago. At this time recommend that patient continue skilled PT services for 4 more weeks, at 2x/week, in order to continue addressing her impairments and to assist her in reaching optimal level of function.    Pt will benefit from skilled therapeutic intervention in order to improve on the following deficits Abnormal gait;Hypomobility;Decreased activity tolerance;Decreased strength;Pain;Decreased balance;Decreased mobility;Improper body mechanics;Decreased coordination;Difficulty walking;Postural dysfunction;Impaired flexibility   Rehab Potential Good   PT Frequency 2x / week   PT Duration 4 weeks   PT Treatment/Interventions ADLs/Self Care Home Management;Gait training;Stair training;Functional mobility training;Therapeutic activities;Therapeutic exercise;Balance training;Neuromuscular re-education;Patient/family education;Manual techniques   PT Next Visit Plan Continue functional strength and stretching, continue to progress balance.   PT Home Exercise Plan  given    Consulted and Agree with Plan of Care Patient        Problem List Patient Active Problem List   Diagnosis Date Noted  . Atypical chest pain 03/30/2015  . Special screening for malignant neoplasms, colon 03/30/2015  . Palpitations 03/27/2015  . Hyperlipidemia LDL goal <100 07/15/2014  . Need for prophylactic vaccination and inoculation against influenza 07/15/2014  . GERD (gastroesophageal reflux disease) 10/09/2013  . IBS (irritable bowel syndrome) 10/09/2013  . GAD (generalized anxiety disorder) 05/14/2013  . Metabolic syndrome X 28/31/5176  . HTN, goal below 130/80 01/09/2013  . Nicotine dependence 01/09/2013  . Insomnia 08/14/2012  . Prediabetes 04/16/2012  . Routine general medical examination at a health care facility 04/16/2012  . Chest pain, atypical 11/29/2011  . Adrenal adenoma 08/06/2011  . Meniere's disease 04/22/2011  . Sleep apnea 04/22/2011  . Breast cancer 12/07/2010  . THYROID MASS 09/18/2010  . ABNORMAL THYROID FUNCTION TESTS 08/17/2010  . Hypothyroidism 01/15/2010  . Vitamin D deficiency 01/15/2010  . Morbid obesity 01/01/2010  . IBS 01/01/2010  . BREAST CANCER, HX OF 01/01/2010   Physical Therapy Progress Note  Dates of Reporting Period: 04/09/15  to 05/15/15  Objective Reports of Subjective Statement: see above   Objective Measurements: see above   Goal Update: see above   Plan: see above   Reason Skilled Services are Required: L knee/hip pain, muscle weakness, bilateral hip and ankle stiffness, impaired balance    Deniece Ree PT, DPT [place], [place], [address] Phone: [phone]   Fax:  [phone]

## 2015-05-20 ENCOUNTER — Ambulatory Visit (HOSPITAL_COMMUNITY): Payer: BLUE CROSS/BLUE SHIELD

## 2015-05-20 DIAGNOSIS — R6889 Other general symptoms and signs: Secondary | ICD-10-CM

## 2015-05-20 DIAGNOSIS — R2681 Unsteadiness on feet: Secondary | ICD-10-CM

## 2015-05-20 DIAGNOSIS — M79605 Pain in left leg: Secondary | ICD-10-CM

## 2015-05-20 DIAGNOSIS — R29898 Other symptoms and signs involving the musculoskeletal system: Secondary | ICD-10-CM

## 2015-05-20 DIAGNOSIS — M79604 Pain in right leg: Secondary | ICD-10-CM

## 2015-05-20 DIAGNOSIS — Z853 Personal history of malignant neoplasm of breast: Secondary | ICD-10-CM | POA: Diagnosis not present

## 2015-05-20 NOTE — Therapy (Signed)
[place], [place], [address] Phone: [phone]   Fax:  [phone]  Physical Therapy Treatment  Patient Details  Name: Annette [name] MRN: [id number] Date of Birth: 1961/02/03 Referring Provider:  Patrici Ranks, MD  Encounter Date: 05/20/2015      PT End of Session - 05/20/15 1430    Visit Number 7   Number of Visits 16   Date for PT Re-Evaluation 06/12/15   Authorization Type [employer] Anthem (visit limit 60 combined OT/PT)   Authorization Time Period 04/09/15 to 06/10/15   PT Start Time 1350   PT Stop Time 1432   PT Time Calculation (min) 42 min   Equipment Utilized During Treatment Gait belt   Activity Tolerance Patient tolerated treatment well   Behavior During Therapy [facility] for tasks assessed/performed      Past Medical History  Diagnosis Date  . Sleep apnea 2010    Problems with CPAP  . GERD (gastroesophageal reflux disease)   . Menieres disease     Controlled with triamterene   . Pituitary insufficiency   . IBS (irritable bowel syndrome)   . Depression   . Obesity   . Migraines   . Palpitations   . Bruises easily   . Breast cancer 2007    History of  XRT, onTamoxifen  . Diabetes mellitus     prediabetic  . Hypothyroidism     following chemo amnd radiaition for breast cancer, needed replacement short term  . Hypothyroidism (acquired)     replaced x 1 year    Past Surgical History  Procedure Laterality Date  . Thyroidectomy, partial  11/05/2010    Benign disease  . Appendectomy  06/12/08  . Cholecystectomy  1985  . Breast surgery  2007    Left lumpectomy  . Breast reconstruction      Left reconstructive  . Breast surgery      Mammosite - right side  . Abdominal hysterectomy  1998    Benign, fibroids  . Adrenalectomy  02/16/2012    [facility], splemic trauma, 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  . Splenectomy, total  2/87/8676    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     There were no vitals filed for this visit.  Visit Diagnosis:  History of breast cancer  Weakness of both legs  Decreased functional activity tolerance  Unsteadiness  Pain of left lower extremity  Pain of right lower extremity      Subjective Assessment - 05/20/15 1352    Subjective Pt reports she is pain free today, main difficulty current with balance.   Currently in Pain? No/denies             [facility] Adult PT Treatment/Exercise - 05/20/15 0001    Exercises   Exercises Knee/Hip   Knee/Hip Exercises: Aerobic   Elliptical 2' L1   Nustep unavailable   Knee/Hip Exercises: Standing   Heel Raises Both;10 reps   Heel Raises Limitations Proper lifting yellow ball from 12 in then heel raise   Functional Squat 10 reps   Functional Squat Limitations Proper lifting yellow ball from 12 in then heel raise   Wall Squat 5 sets;5 seconds   Stairs 5RT no HHA cueing for eccentric control descending   SLS with Vectors 2x 5" Bil LE   Other Standing Knee Exercises Tandem stance 3x 30", sidestepping 2RT RTB   Other Standing Knee Exercises Balance beam tandem and retro 1RT; Warrior pose I and II 1x  30" Bil              PT Short Term Goals - 05/15/15 1328    PT SHORT TERM GOAL #1   Title Patient will demosntrate 5/5 strength in bilateral lower extremities and at least 4+/5 strength in proximal musculature, also at least 4-/5 strength in core     Time 4   Period Weeks   Status On-going   PT SHORT TERM GOAL #2   Title Patient will demonstrate bilateral hip IR of at least 40 degrees to assist in overall mobility and mechanics    Time 4   Period Weeks   Status On-going   PT SHORT TERM GOAL #3   Title Patient will demonstrate the ability to maintain SLS at least 45 seconds each LE    Time 4   Period Weeks   Status On-going   PT SHORT TERM GOAL #4   Title Patient to be independent in correctly and consistently performing appropriate HEP, to be updated PRN    Baseline 8/11-   doing at least 5 days per week   Time 4   Period Weeks   Status Achieved           PT Long Term Goals - 05/15/15 1332    PT LONG TERM GOAL #1   Title Patient will consistently be able to stand for at least 60 minutes with pain 0/10  to facilitate functional task performance such as cooking or home maintenance    Time 8   Period Weeks   Status On-going   PT LONG TERM GOAL #2   Title Patient will consistently be able to ambulate for at least 45 minutes without seated rest in order to increase ease of functional activities such as going to the store    Time 8   Period Weeks   Status On-going   PT LONG TERM GOAL #3   Title Patient will score at least 110 on FACIT-F with all sections answered    Time 8   Period Weeks   Status On-going   PT LONG TERM GOAL #4   Title Patient will demonstrate improved functional activity tolerance as evidenced by an abiltiy to regularly participate in functional exercise such as swimming for at least 45 minutes, 3 times a week    Baseline 8/11- patient reports she is able to remain active in the pool for 3 hours, multiple days per week ; reports she was able to walk for a couple hours in [place] the other week but did have some    Time 8   Period Weeks   Status Achieved               Plan - 05/20/15 1701    Clinical Impression Statement Session focus on improving functional strengthening and progressed new balance activities with dynamic surfaces and warrior poses to improve core strengthening.  Min assistance required with tandem and retro gait on balance beam following cueing to improve spatial awareness.  Added wall squats for gluteal strengtheniing.  Pt limtied by fatigue with new activities, no reports of pain through session.   PT Next Visit Plan Continue functional strength and stretching, continue to progress balance.        Problem List Patient Active Problem List   Diagnosis Date Noted  . Atypical chest pain 03/30/2015  .  Special screening for malignant neoplasms, colon 03/30/2015  . Palpitations 03/27/2015  . Hyperlipidemia LDL goal <100 07/15/2014  . Need for prophylactic  vaccination and inoculation against influenza 07/15/2014  . GERD (gastroesophageal reflux disease) 10/09/2013  . IBS (irritable bowel syndrome) 10/09/2013  . GAD (generalized anxiety disorder) 05/14/2013  . Metabolic syndrome X 32/44/0102  . HTN, goal below 130/80 01/09/2013  . Nicotine dependence 01/09/2013  . Insomnia 08/14/2012  . Prediabetes 04/16/2012  . Routine general medical examination at a health care facility 04/16/2012  . Chest pain, atypical 11/29/2011  . Adrenal adenoma 08/06/2011  . Meniere's disease 04/22/2011  . Sleep apnea 04/22/2011  . Breast cancer 12/07/2010  . THYROID MASS 09/18/2010  . ABNORMAL THYROID FUNCTION TESTS 08/17/2010  . Hypothyroidism 01/15/2010  . Vitamin D deficiency 01/15/2010  . Morbid obesity 01/01/2010  . IBS 01/01/2010  . BREAST CANCER, HX OF 01/01/2010   Ihor Austin, [place]; [place]  Aldona Lento 05/20/2015, 5:53 PM  [place] [address] [place], [place], [address] Phone: [phone]   Fax:  [phone]

## 2015-05-21 ENCOUNTER — Ambulatory Visit (INDEPENDENT_AMBULATORY_CARE_PROVIDER_SITE_OTHER): Payer: BLUE CROSS/BLUE SHIELD | Admitting: Cardiovascular Disease

## 2015-05-21 ENCOUNTER — Encounter: Payer: Self-pay | Admitting: Cardiovascular Disease

## 2015-05-21 VITALS — BP 104/72 | HR 72 | Ht 66.0 in | Wt 217.0 lb

## 2015-05-21 DIAGNOSIS — E785 Hyperlipidemia, unspecified: Secondary | ICD-10-CM

## 2015-05-21 DIAGNOSIS — E8881 Metabolic syndrome: Secondary | ICD-10-CM

## 2015-05-21 DIAGNOSIS — R079 Chest pain, unspecified: Secondary | ICD-10-CM

## 2015-05-21 DIAGNOSIS — IMO0001 Reserved for inherently not codable concepts without codable children: Secondary | ICD-10-CM

## 2015-05-21 DIAGNOSIS — R002 Palpitations: Secondary | ICD-10-CM

## 2015-05-21 DIAGNOSIS — Z136 Encounter for screening for cardiovascular disorders: Secondary | ICD-10-CM

## 2015-05-21 NOTE — Progress Notes (Signed)
Patient ID: Annette [name], female   DOB: 1961/03/01, 54 y.o.   MRN: [id number]      SUBJECTIVE: The patient returns for follow-up after undergoing cardiovascular testing performed for the evaluation of chest pain and palpitations. Nuclear stress testing was normal and cardiac event monitoring demonstrated normal sinus rhythm with one PAC.  No chest pain recurrences. Had palpitations for three consecutive days after wearing the monitor.  Soc: Married. Originally from [place]. Smokes 1-1.5 ppd. Worked at [employer] x 15 yrs.  Fam: Father had CABG at 16. Mother has atrial fibrillation.   Review of Systems: As per "subjective", otherwise negative.  Allergies  Allergen Reactions  . Penicillins Swelling    Of face and tongue.  . Metformin And Related Nausea Only    Pt to discontinue med due to intolerance  . Statins Other (See Comments)    Major concerns re safety  . Tape Itching    Surgical adhesive  . Zetia [Ezetimibe] Other (See Comments)    Feels funny    Current Outpatient Prescriptions  Medication Sig Dispense Refill  . Choline Fenofibrate 135 MG capsule Take 1 capsule (135 mg total) by mouth daily. 30 capsule 5  . clotrimazole-betamethasone (LOTRISONE) cream Apply topically as needed. 30 g 1  . dicyclomine (BENTYL) 20 MG tablet Take 20 mg by mouth 2 (two) times daily.    . diphenoxylate-atropine (LOMOTIL) 2.5-0.025 MG per tablet Take 1 tablet by mouth 2 (two) times daily as needed for diarrhea or loose stools.    Marland Kitchen LORazepam (ATIVAN) 1 MG tablet Take 1 tablet (1 mg total) by mouth every 8 (eight) hours as needed for anxiety (and/or nausea). 30 tablet 1  . metoprolol tartrate (LOPRESSOR) 25 MG tablet TAKE 1 & 1/2 TABLETS BY MOUTH TWO TIMES A DAY 180 tablet 1  . Misc Natural Products (APPLE CIDER VINEGAR) TABS Take 2 tablets by mouth 2 (two) times daily.    . naproxen (NAPROSYN) 500 MG tablet Take 1 tablet (500 mg total) by mouth 2 (two) times daily with a meal.  (Patient taking differently: Take 500 mg by mouth as needed. ) 40 tablet 3  . omeprazole (PRILOSEC) 20 MG capsule TAKE ONE CAPSULE BY MOUTH TWO TIMES A DAY INSTEAD OF PROTONIX. 180 capsule 1  . temazepam (RESTORIL) 15 MG capsule TAKE ONE CAPSULE (15MG  TOTAL) BY MOUTH AT BEDTIME AS NEEDED FOR SLEEP 30 capsule 3  . traZODone (DESYREL) 100 MG tablet TAKE 1 TABLET (100 MG TOTAL) BY MOUTH AT BEDTIME. 90 tablet 1  . triamterene-hydrochlorothiazide (DYAZIDE) 37.5-25 MG per capsule Take 1 each (1 capsule total) by mouth daily. 90 capsule 2  . venlafaxine XR (EFFEXOR-XR) 75 MG 24 hr capsule TAKE 1 CAPSULE (75 MG TOTAL) BY MOUTH DAILY. 30 capsule 5  . Vitamin D, Ergocalciferol, (DRISDOL) 50000 UNITS CAPS capsule Take 1 capsule (50,000 Units total) by mouth every 7 (seven) days. 4 capsule 5   No current facility-administered medications for this visit.    Past Medical History  Diagnosis Date  . Sleep apnea 2010    Problems with CPAP  . GERD (gastroesophageal reflux disease)   . Menieres disease     Controlled with triamterene   . Pituitary insufficiency   . IBS (irritable bowel syndrome)   . Depression   . Obesity   . Migraines   . Palpitations   . Bruises easily   . Breast cancer 2007    History of  XRT, onTamoxifen  . Diabetes mellitus  prediabetic  . Hypothyroidism     following chemo amnd radiaition for breast cancer, needed replacement short term  . Hypothyroidism (acquired)     replaced x 1 year    Past Surgical History  Procedure Laterality Date  . Thyroidectomy, partial  11/05/2010    Benign disease  . Appendectomy  06/12/08  . Cholecystectomy  1985  . Breast surgery  2007    Left lumpectomy  . Breast reconstruction      Left reconstructive  . Breast surgery      Mammosite - right side  . Abdominal hysterectomy  1998    Benign, fibroids  . Adrenalectomy  02/16/2012    [facility], splemic trauma, 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  . 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 Marital Status: Married    Spouse Name: N/A  . Number of Children: N/A  . Years of Education: N/A   Occupational History  . Not on file.   Social History Main Topics  . Smoking status: Current Every Day Smoker -- 0.30 packs/day    Types: Cigarettes  . Smokeless tobacco: Never Used     Comment: 1/2 pack a day  . Alcohol Use: No     Comment: Encouraged to quit smoking. She has tried the nicotone gum and patches but did not help  . Drug Use: No  . Sexual Activity: Yes    Birth Control/ Protection: Surgical   Other Topics Concern  . Not on file   Social History Narrative     Filed Vitals:   05/21/15 1349  BP: 104/72  Pulse: 72  Height: 5\' 6"  (1.676 m)  Weight: 217 lb (98.431 kg)  SpO2: 94%    PHYSICAL EXAM General: NAD Neck: No JVD, no thyromegaly or thyroid nodule.  Lungs: Clear to auscultation bilaterally with normal respiratory effort. CV: Nondisplaced PMI. Regular rate and rhythm, normal S1/S2, no S3/S4, no murmur. No peripheral edema. No carotid bruit. Normal pedal pulses.  Abdomen: Soft, nontender, obese, no distention.  Skin: Intact without lesions or rashes.  Neurologic: Alert and oriented x 3.  Psych: Normal affect. Extremities: No clubbing or cyanosis.  HEENT: Normal.   ECG: Most recent ECG reviewed.      ASSESSMENT AND PLAN: 1. Chest pain: Normal Lexiscan Cardiolite stress test. No further testing indicated.  2. Hyperlipidemia: Afraid to take statins as father had severe reaction to Lipitor. Currently on fenofibrate. Consider Zetia.  3. Palpitations: Event monitor was unremarkable. I told her if she has frequent recurrences, may consider longer event monitoring.  4. Metabolic syndrome: Previously made a referral to endocrinology as patient had been unable to lose weight in spite of several diets. TSH normal.  5. Tobacco abuse: Cessation counseling previously  provided.  Dispo: f/u prn.   Kate Sable, M.D., F.A.C.C.

## 2015-05-21 NOTE — Patient Instructions (Signed)
Continue all current medications. Follow up AS NEEDED

## 2015-05-22 ENCOUNTER — Ambulatory Visit (HOSPITAL_COMMUNITY): Payer: BLUE CROSS/BLUE SHIELD | Admitting: Physical Therapy

## 2015-05-22 DIAGNOSIS — Z853 Personal history of malignant neoplasm of breast: Secondary | ICD-10-CM

## 2015-05-22 DIAGNOSIS — R6889 Other general symptoms and signs: Secondary | ICD-10-CM

## 2015-05-22 DIAGNOSIS — R29898 Other symptoms and signs involving the musculoskeletal system: Secondary | ICD-10-CM

## 2015-05-22 DIAGNOSIS — M79604 Pain in right leg: Secondary | ICD-10-CM

## 2015-05-22 DIAGNOSIS — R2681 Unsteadiness on feet: Secondary | ICD-10-CM

## 2015-05-22 DIAGNOSIS — M79605 Pain in left leg: Secondary | ICD-10-CM

## 2015-05-22 NOTE — Therapy (Signed)
[facility] [place], [place], [address] Phone: [phone]   Fax:  [phone]  Physical Therapy Treatment  Patient Details  Name: Annette [name] MRN: [id number] Date of Birth: 02-23-1961 Referring Provider:  Patrici Ranks, MD  Encounter Date: 05/22/2015      PT End of Session - 05/22/15 1340    Visit Number 8   Number of Visits 16   Date for PT Re-Evaluation 06/12/15   Authorization Type [employer] An[employer] (visit limit 60 combined OT/PT)   Authorization Time Period 04/09/15 to 06/10/15   PT Start Time 1300   PT Stop Time 1345   PT Time Calculation (min) 45 min   Equipment Utilized During Treatment Gait belt   Activity Tolerance Patient tolerated treatment well   Behavior During Therapy [facility] for tasks assessed/performed      Past Medical History  Diagnosis Date  . Sleep apnea 2010    Problems with CPAP  . GERD (gastroesophageal reflux disease)   . Menieres disease     Controlled with triamterene   . Pituitary insufficiency   . IBS (irritable bowel syndrome)   . Depression   . Obesity   . Migraines   . Palpitations   . Bruises easily   . Breast cancer 2007    History of  XRT, onTamoxifen  . Diabetes mellitus     prediabetic  . Hypothyroidism     following chemo amnd radiaition for breast cancer, needed replacement short term  . Hypothyroidism (acquired)     replaced x 1 year    Past Surgical History  Procedure Laterality Date  . Thyroidectomy, partial  11/05/2010    Benign disease  . Appendectomy  06/12/08  . Cholecystectomy  1985  . Breast surgery  2007    Left lumpectomy  . Breast reconstruction      Left reconstructive  . Breast surgery      Mammosite - right side  . Abdominal hysterectomy  1998    Benign, fibroids  . Adrenalectomy  02/16/2012    [facility], splemic trauma, 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  . Splenectomy, total  04/15/4579    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     There were no vitals filed for this visit.  Visit Diagnosis:  History of breast cancer  Weakness of both legs  Decreased functional activity tolerance  Unsteadiness  Pain of left lower extremity  Pain of right lower extremity      Subjective Assessment - 05/22/15 1309    Subjective Pt states she is sore today from her last session.  Pt reports no real pain.    Currently in Pain? No/denies                         [facility] Adult PT Treatment/Exercise - 05/22/15 1308    High Level Balance   High Level Balance Activities Tandem walking   Knee/Hip Exercises: Stretches   Active Hamstring Stretch Both;30 seconds;3 reps   Active Hamstring Stretch Limitations 12 inch box   Gastroc Stretch Both;3 reps;30 seconds   Gastroc Stretch Limitations slantboard    Knee/Hip Exercises: Aerobic   Nustep nustep level 3 hills #3 10" LE only   Knee/Hip Exercises: Standing   Heel Raises Both;10 reps   Heel Raises Limitations Proper lifting yellow ball from 12 in then heel raise   Forward Lunges Both;1 set;15 reps   Forward Lunges Limitations 4 inch box  Side Lunges Both;10 reps   Side Lunges Limitations 4 inch box   Lateral Step Up Both;10 reps;Hand Hold: 1;Step Height: 6"   Lateral Step Up Limitations 7" staircase   Forward Step Up Both;1 set;10 reps;Step Height: 6"   Forward Step Up Limitations 7" staircase   Step Down Both;10 reps;Hand Hold: 1;Step Height: 4"   Step Down Limitations 4" step   SLS with Vectors 5x 5" Bil LE                  PT Short Term Goals - 05/15/15 1328    PT SHORT TERM GOAL #1   Title Patient will demosntrate 5/5 strength in bilateral lower extremities and at least 4+/5 strength in proximal musculature, also at least 4-/5 strength in core     Time 4   Period Weeks   Status On-going   PT SHORT TERM GOAL #2   Title Patient will demonstrate bilateral hip IR of at least 40 degrees to assist in overall mobility and mechanics    Time 4    Period Weeks   Status On-going   PT SHORT TERM GOAL #3   Title Patient will demonstrate the ability to maintain SLS at least 45 seconds each LE    Time 4   Period Weeks   Status On-going   PT SHORT TERM GOAL #4   Title Patient to be independent in correctly and consistently performing appropriate HEP, to be updated PRN    Baseline 8/11-  doing at least 5 days per week   Time 4   Period Weeks   Status Achieved           PT Long Term Goals - 05/15/15 1332    PT LONG TERM GOAL #1   Title Patient will consistently be able to stand for at least 60 minutes with pain 0/10  to facilitate functional task performance such as cooking or home maintenance    Time 8   Period Weeks   Status On-going   PT LONG TERM GOAL #2   Title Patient will consistently be able to ambulate for at least 45 minutes without seated rest in order to increase ease of functional activities such as going to the store    Time 8   Period Weeks   Status On-going   PT LONG TERM GOAL #3   Title Patient will score at least 110 on FACIT-F with all sections answered    Time 8   Period Weeks   Status On-going   PT LONG TERM GOAL #4   Title Patient will demonstrate improved functional activity tolerance as evidenced by an abiltiy to regularly participate in functional exercise such as swimming for at least 45 minutes, 3 times a week    Baseline 8/11- patient reports she is able to remain active in the pool for 3 hours, multiple days per week ; reports she was able to walk for a couple hours in [place] the other week but did have some    Time 8   Period Weeks   Status Achieved               Plan - 05/22/15 1340    Clinical Impression Statement Continued focus on LE strength and overall actvitiy tolerance.  Pt request not to complete elliptical this session as it was too strenuous at this point.  Pt able to complete exercises with minimal cues needed.  No LOB with tandem gait on level surface today.  PT Next  Visit Plan Continue functional strength and stretching, continue to progress balance. Progress to balance beam next session.        Problem List Patient Active Problem List   Diagnosis Date Noted  . Atypical chest pain 03/30/2015  . Special screening for malignant neoplasms, colon 03/30/2015  . Palpitations 03/27/2015  . Hyperlipidemia LDL goal <100 07/15/2014  . Need for prophylactic vaccination and inoculation against influenza 07/15/2014  . GERD (gastroesophageal reflux disease) 10/09/2013  . IBS (irritable bowel syndrome) 10/09/2013  . GAD (generalized anxiety disorder) 05/14/2013  . Metabolic syndrome X 24/06/7352  . HTN, goal below 130/80 01/09/2013  . Nicotine dependence 01/09/2013  . Insomnia 08/14/2012  . Prediabetes 04/16/2012  . Routine general medical examination at a health care facility 04/16/2012  . Chest pain, atypical 11/29/2011  . Adrenal adenoma 08/06/2011  . Meniere's disease 04/22/2011  . Sleep apnea 04/22/2011  . Breast cancer 12/07/2010  . THYROID MASS 09/18/2010  . ABNORMAL THYROID FUNCTION TESTS 08/17/2010  . Hypothyroidism 01/15/2010  . Vitamin D deficiency 01/15/2010  . Morbid obesity 01/01/2010  . IBS 01/01/2010  . BREAST CANCER, HX OF 01/01/2010    Teena Irani, PTA/CLT [phone]  05/22/2015, 1:49 PM  [facility] [address] [place], [place], [address] Phone: [phone]   Fax:  [phone]

## 2015-05-27 ENCOUNTER — Ambulatory Visit (HOSPITAL_COMMUNITY): Payer: BLUE CROSS/BLUE SHIELD | Admitting: Physical Therapy

## 2015-05-29 ENCOUNTER — Other Ambulatory Visit: Payer: Self-pay | Admitting: Family Medicine

## 2015-05-29 ENCOUNTER — Ambulatory Visit (HOSPITAL_COMMUNITY): Payer: BLUE CROSS/BLUE SHIELD | Admitting: Physical Therapy

## 2015-05-29 DIAGNOSIS — M79605 Pain in left leg: Secondary | ICD-10-CM

## 2015-05-29 DIAGNOSIS — R29898 Other symptoms and signs involving the musculoskeletal system: Secondary | ICD-10-CM

## 2015-05-29 DIAGNOSIS — Z853 Personal history of malignant neoplasm of breast: Secondary | ICD-10-CM

## 2015-05-29 DIAGNOSIS — R2681 Unsteadiness on feet: Secondary | ICD-10-CM

## 2015-05-29 DIAGNOSIS — M79604 Pain in right leg: Secondary | ICD-10-CM

## 2015-05-29 DIAGNOSIS — R6889 Other general symptoms and signs: Secondary | ICD-10-CM

## 2015-05-29 NOTE — Therapy (Signed)
[facility] [address] [place], [place], [address] Phone: [phone]   Fax:  [phone]  Physical Therapy Treatment  Patient Details  Name: Annette [name] MRN: [id number] Date of Birth: 01-Jan-1961 Referring Provider:  Fayrene Helper, MD  Encounter Date: 05/29/2015      PT End of Session - 05/29/15 1532    Visit Number 9   Number of Visits 16   Date for PT Re-Evaluation 06/12/15   Authorization Type [employer] An[employer] (visit limit 60 combined OT/PT)   Authorization Time Period 04/09/15 to 06/10/15   PT Start Time 1515   PT Stop Time 1610   PT Time Calculation (min) 55 min   Equipment Utilized During Treatment Gait belt   Activity Tolerance Patient tolerated treatment well   Behavior During Therapy [facility] for tasks assessed/performed      Past Medical History  Diagnosis Date  . Sleep apnea 2010    Problems with CPAP  . GERD (gastroesophageal reflux disease)   . Menieres disease     Controlled with triamterene   . Pituitary insufficiency   . IBS (irritable bowel syndrome)   . Depression   . Obesity   . Migraines   . Palpitations   . Bruises easily   . Breast cancer 2007    History of  XRT, onTamoxifen  . Diabetes mellitus     prediabetic  . Hypothyroidism     following chemo amnd radiaition for breast cancer, needed replacement short term  . Hypothyroidism (acquired)     replaced x 1 year    Past Surgical History  Procedure Laterality Date  . Thyroidectomy, partial  11/05/2010    Benign disease  . Appendectomy  06/12/08  . Cholecystectomy  1985  . Breast surgery  2007    Left lumpectomy  . Breast reconstruction      Left reconstructive  . Breast surgery      Mammosite - right side  . Abdominal hysterectomy  1998    Benign, fibroids  . Adrenalectomy  02/16/2012    [facility], splemic trauma, 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  . Splenectomy, total  7/51/7001    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     There were no vitals filed for this visit.  Visit Diagnosis:  History of breast cancer  Weakness of both legs  Decreased functional activity tolerance  Unsteadiness  Pain of left lower extremity  Pain of right lower extremity      Subjective Assessment - 05/29/15 1531    Subjective Pt states she feels she's getting stronger, however in constant pain in her Lt knee and both her hips.  Pain varies 1-10/10   Currently in Pain? Yes   Pain Score 3    Pain Location Knee   Pain Orientation Left                         [facility] Adult PT Treatment/Exercise - 05/29/15 0001    Knee/Hip Exercises: Stretches   Active Hamstring Stretch Both;30 seconds;3 reps   Active Hamstring Stretch Limitations 12 inch box   Gastroc Stretch Both;3 reps;30 seconds   Gastroc Stretch Limitations slantboard    Knee/Hip Exercises: Standing   Heel Raises Both;15 reps   Heel Raises Limitations Proper lifting yellow ball from 12 in then heel raise   Forward Lunges Both;10 reps   Forward Lunges Limitations on floor   Lateral Step Up Both;10 reps;Hand Hold: 1;Step Height: 6"  Lateral Step Up Limitations 7" staircase   Forward Step Up Both;1 set;10 reps;Step Height: 6"   Forward Step Up Limitations 7" staircase   Step Down Step Height: 6";Both;10 reps   Step Down Limitations 6" step   SLS with Vectors 5x 5" Bil LE   Other Standing Knee Exercises Tandem stance 3x 30", sidestepping 2RT RTB                  PT Short Term Goals - 05/15/15 1328    PT SHORT TERM GOAL #1   Title Patient will demosntrate 5/5 strength in bilateral lower extremities and at least 4+/5 strength in proximal musculature, also at least 4-/5 strength in core     Time 4   Period Weeks   Status On-going   PT SHORT TERM GOAL #2   Title Patient will demonstrate bilateral hip IR of at least 40 degrees to assist in overall mobility and mechanics    Time 4   Period Weeks   Status On-going   PT SHORT TERM  GOAL #3   Title Patient will demonstrate the ability to maintain SLS at least 45 seconds each LE    Time 4   Period Weeks   Status On-going   PT SHORT TERM GOAL #4   Title Patient to be independent in correctly and consistently performing appropriate HEP, to be updated PRN    Baseline 8/11-  doing at least 5 days per week   Time 4   Period Weeks   Status Achieved           PT Long Term Goals - 05/15/15 1332    PT LONG TERM GOAL #1   Title Patient will consistently be able to stand for at least 60 minutes with pain 0/10  to facilitate functional task performance such as cooking or home maintenance    Time 8   Period Weeks   Status On-going   PT LONG TERM GOAL #2   Title Patient will consistently be able to ambulate for at least 45 minutes without seated rest in order to increase ease of functional activities such as going to the store    Time 8   Period Weeks   Status On-going   PT LONG TERM GOAL #3   Title Patient will score at least 110 on FACIT-F with all sections answered    Time 8   Period Weeks   Status On-going   PT LONG TERM GOAL #4   Title Patient will demonstrate improved functional activity tolerance as evidenced by an abiltiy to regularly participate in functional exercise such as swimming for at least 45 minutes, 3 times a week    Baseline 8/11- patient reports she is able to remain active in the pool for 3 hours, multiple days per week ; reports she was able to walk for a couple hours in [place] the other week but did have some    Time 8   Period Weeks   Status Achieved               Plan - 05/29/15 1644    Clinical Impression Statement Focused on improving strength and stability of LE's.  Added UE movement with lunges onto floor today with good control and stablity.  Pt doing well overall with all exercises with balance beam being most challenging at this point.  Good clearance of 12" hurdles.    PT Next Visit Plan Continue functional strength and  stretching, continue to progress balance.  Problem List Patient Active Problem List   Diagnosis Date Noted  . Atypical chest pain 03/30/2015  . Special screening for malignant neoplasms, colon 03/30/2015  . Palpitations 03/27/2015  . Hyperlipidemia LDL goal <100 07/15/2014  . Need for prophylactic vaccination and inoculation against influenza 07/15/2014  . GERD (gastroesophageal reflux disease) 10/09/2013  . IBS (irritable bowel syndrome) 10/09/2013  . GAD (generalized anxiety disorder) 05/14/2013  . Metabolic syndrome X 41/93/7902  . HTN, goal below 130/80 01/09/2013  . Nicotine dependence 01/09/2013  . Insomnia 08/14/2012  . Prediabetes 04/16/2012  . Routine general medical examination at a health care facility 04/16/2012  . Chest pain, atypical 11/29/2011  . Adrenal adenoma 08/06/2011  . Meniere's disease 04/22/2011  . Sleep apnea 04/22/2011  . Breast cancer 12/07/2010  . THYROID MASS 09/18/2010  . ABNORMAL THYROID FUNCTION TESTS 08/17/2010  . Hypothyroidism 01/15/2010  . Vitamin D deficiency 01/15/2010  . Morbid obesity 01/01/2010  . IBS 01/01/2010  . BREAST CANCER, HX OF 01/01/2010    Teena Irani, PTA/CLT [phone]  05/29/2015, 4:48 PM  [facility] [address] [place], [place], [address] Phone: [phone]   Fax:  [phone]

## 2015-06-02 ENCOUNTER — Encounter: Payer: BLUE CROSS/BLUE SHIELD | Attending: Family Medicine | Admitting: Nutrition

## 2015-06-02 VITALS — Ht 66.0 in | Wt 219.0 lb

## 2015-06-02 DIAGNOSIS — E669 Obesity, unspecified: Secondary | ICD-10-CM

## 2015-06-02 DIAGNOSIS — E119 Type 2 diabetes mellitus without complications: Secondary | ICD-10-CM

## 2015-06-03 ENCOUNTER — Telehealth (HOSPITAL_COMMUNITY): Payer: Self-pay | Admitting: Physical Therapy

## 2015-06-03 ENCOUNTER — Ambulatory Visit (HOSPITAL_COMMUNITY): Payer: BLUE CROSS/BLUE SHIELD | Admitting: Physical Therapy

## 2015-06-03 NOTE — Telephone Encounter (Signed)
Patient a no-show for today's appointment. Called and left message regarding time and date of next appointment.  Deniece Ree PT, DPT [phone]

## 2015-06-05 ENCOUNTER — Ambulatory Visit: Payer: BLUE CROSS/BLUE SHIELD | Admitting: Nutrition

## 2015-06-05 ENCOUNTER — Other Ambulatory Visit (INDEPENDENT_AMBULATORY_CARE_PROVIDER_SITE_OTHER): Payer: Self-pay | Admitting: Internal Medicine

## 2015-06-05 ENCOUNTER — Ambulatory Visit (HOSPITAL_COMMUNITY): Payer: BLUE CROSS/BLUE SHIELD | Attending: Hematology & Oncology | Admitting: Physical Therapy

## 2015-06-05 ENCOUNTER — Telehealth (INDEPENDENT_AMBULATORY_CARE_PROVIDER_SITE_OTHER): Payer: Self-pay | Admitting: Internal Medicine

## 2015-06-05 DIAGNOSIS — R6889 Other general symptoms and signs: Secondary | ICD-10-CM | POA: Diagnosis not present

## 2015-06-05 DIAGNOSIS — R29898 Other symptoms and signs involving the musculoskeletal system: Secondary | ICD-10-CM

## 2015-06-05 DIAGNOSIS — Z853 Personal history of malignant neoplasm of breast: Secondary | ICD-10-CM | POA: Diagnosis present

## 2015-06-05 DIAGNOSIS — M79605 Pain in left leg: Secondary | ICD-10-CM | POA: Diagnosis present

## 2015-06-05 DIAGNOSIS — M79604 Pain in right leg: Secondary | ICD-10-CM | POA: Insufficient documentation

## 2015-06-05 DIAGNOSIS — R2681 Unsteadiness on feet: Secondary | ICD-10-CM | POA: Diagnosis present

## 2015-06-05 DIAGNOSIS — R197 Diarrhea, unspecified: Secondary | ICD-10-CM

## 2015-06-05 MED ORDER — DIPHENOXYLATE-ATROPINE 2.5-0.025 MG PO TABS: 1.0000 | ORAL_TABLET | Freq: Two times a day (BID) | ORAL | Status: DC | PRN

## 2015-06-05 NOTE — Therapy (Signed)
[facility] [address] [place], [place], [address] Phone: [phone]   Fax:  [phone]  Physical Therapy Treatment  Patient Details  Name: Annette [name] MRN: [id number] Date of Birth: [birth year] Referring Provider:  Patrici Ranks, MD  Encounter Date: 06/05/2015      PT End of Session - 06/05/15 1349    Visit Number 10   Number of Visits 16   Date for PT Re-Evaluation 06/12/15   Authorization Type [employer] An[employer] (visit limit 60 combined OT/PT)   Authorization Time Period 06/11/15 to 08/11/15   PT Start Time 1300   PT Stop Time 1345   PT Time Calculation (min) 45 min   Equipment Utilized During Treatment Gait belt   Activity Tolerance Patient tolerated treatment well   Behavior During Therapy [facility] for tasks assessed/performed      Past Medical History  Diagnosis Date  . Sleep apnea 2010    Problems with CPAP  . GERD (gastroesophageal reflux disease)   . Menieres disease     Controlled with triamterene   . Pituitary insufficiency   . IBS (irritable bowel syndrome)   . Depression   . Obesity   . Migraines   . Palpitations   . Bruises easily   . Breast cancer 2007    History of  XRT, onTamoxifen  . Diabetes mellitus     prediabetic  . Hypothyroidism     following chemo amnd radiaition for breast cancer, needed replacement short term  . Hypothyroidism (acquired)     replaced x 1 year    Past Surgical History  Procedure Laterality Date  . Thyroidectomy, partial  11/05/2010    Benign disease  . Appendectomy  06/12/08  . Cholecystectomy  1985  . Breast surgery  2007    Left lumpectomy  . Breast reconstruction      Left reconstructive  . Breast surgery      Mammosite - right side  . Abdominal hysterectomy  1998    Benign, fibroids  . Adrenalectomy  02/16/2012    [facility], splemic trauma, 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  . 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  Visit Diagnosis:  Decreased functional activity tolerance - Plan: PT plan of care cert/re-cert  Weakness of both legs - Plan: PT plan of care cert/re-cert  Unsteadiness - Plan: PT plan of care cert/re-cert  Pain of left lower extremity - Plan: PT plan of care cert/re-cert  Pain of right lower extremity - Plan: PT plan of care cert/re-cert      Subjective Assessment - 06/05/15 1758    Subjective Pt states the pain remains unchanged in her LT knee and hips.States her husband was in the ED all night Monday night and she totally forgot about her appointment.  Currently with 3/10 pain.                         [facility] Adult PT Treatment/Exercise - 06/05/15 1309    Knee/Hip Exercises: Stretches   Active Hamstring Stretch Both;30 seconds;3 reps   Active Hamstring Stretch Limitations 12 inch box   Gastroc Stretch Both;3 reps;30 seconds   Gastroc Stretch Limitations slantboard    Knee/Hip Exercises: Aerobic   Nustep nustep level 3 hills #3 10" LE only   Knee/Hip Exercises: Standing   Heel Raises Both;10 reps   Heel Raises Limitations  Proper lifting blue ball from 12 in then heel raise   Forward Lunges Both;10 reps   Forward Lunges Limitations on floor   Side Lunges Both;10 reps   Side Lunges Limitations on floor   Lateral Step Up Both;Hand Hold: 1;Step Height: 6";15 reps   Lateral Step Up Limitations 7" staircase   Forward Step Up Both;1 set;Step Height: 6";15 reps   Forward Step Up Limitations 7" staircase   Step Down Step Height: 6";Both;15 reps   Step Down Limitations 6" step   SLS with Vectors 5x 5" Bil LE   Other Standing Knee Exercises toe rapping on 14" box no HHA   Other Standing Knee Exercises Balance beam tandem 2RT, Warrior pose I and II 1x 30" Bil                   PT Short Term Goals - 05/15/15 1328    PT SHORT TERM GOAL #1   Title Patient will demosntrate 5/5 strength in bilateral lower  extremities and at least 4+/5 strength in proximal musculature, also at least 4-/5 strength in core     Time 4   Period Weeks   Status On-going   PT SHORT TERM GOAL #2   Title Patient will demonstrate bilateral hip IR of at least 40 degrees to assist in overall mobility and mechanics    Time 4   Period Weeks   Status On-going   PT SHORT TERM GOAL #3   Title Patient will demonstrate the ability to maintain SLS at least 45 seconds each LE    Time 4   Period Weeks   Status On-going   PT SHORT TERM GOAL #4   Title Patient to be independent in correctly and consistently performing appropriate HEP, to be updated PRN    Baseline 8/11-  doing at least 5 days per week   Time 4   Period Weeks   Status Achieved           PT Long Term Goals - 05/15/15 1332    PT LONG TERM GOAL #1   Title Patient will consistently be able to stand for at least 60 minutes with pain 0/10  to facilitate functional task performance such as cooking or home maintenance    Time 8   Period Weeks   Status On-going   PT LONG TERM GOAL #2   Title Patient will consistently be able to ambulate for at least 45 minutes without seated rest in order to increase ease of functional activities such as going to the store    Time 8   Period Weeks   Status On-going   PT LONG TERM GOAL #3   Title Patient will score at least 110 on FACIT-F with all sections answered    Time 8   Period Weeks   Status On-going   PT LONG TERM GOAL #4   Title Patient will demonstrate improved functional activity tolerance as evidenced by an abiltiy to regularly participate in functional exercise such as swimming for at least 45 minutes, 3 times a week    Baseline 8/11- patient reports she is able to remain active in the pool for 3 hours, multiple days per week ; reports she was able to walk for a couple hours in [place] the other week but did have some    Time 8   Period Weeks   Status Achieved               Plan - 06/05/15 1755  Clinical Impression Statement PT trying to decide if she is going to accompany her husband on a 500 mil[place] motorcycle ride. Discussed this with her. PT continues to do well in therapy and able to increase challenge of exercises without noted distress.  Warrior II pose most diffiucult to complete with Rt LE leading.    Pt with most diffiuclty at night with increaseing pain and lack of sleep.  Pt able to complete all actvities today without diffiuculty.    PT Next Visit Plan Continue functional strength and stretching, continue to progress balance.         Problem List Patient Active Problem List   Diagnosis Date Noted  . Atypical chest pain 03/30/2015  . Special screening for malignant neoplasms, colon 03/30/2015  . Palpitations 03/27/2015  . Hyperlipidemia LDL goal <100 07/15/2014  . Need for prophylactic vaccination and inoculation against influenza 07/15/2014  . GERD (gastroesophageal reflux disease) 10/09/2013  . IBS (irritable bowel syndrome) 10/09/2013  . GAD (generalized anxiety disorder) 05/14/2013  . Metabolic syndrome X 76/19/5093  . HTN, goal below 130/80 01/09/2013  . Nicotine dependence 01/09/2013  . Insomnia 08/14/2012  . Prediabetes 04/16/2012  . Routine general medical examination at a health care facility 04/16/2012  . Chest pain, atypical 11/29/2011  . Adrenal adenoma 08/06/2011  . Meniere's disease 04/22/2011  . Sleep apnea 04/22/2011  . Breast cancer 12/07/2010  . THYROID MASS 09/18/2010  . ABNORMAL THYROID FUNCTION TESTS 08/17/2010  . Hypothyroidism 01/15/2010  . Vitamin D deficiency 01/15/2010  . Morbid obesity 01/01/2010  . IBS 01/01/2010  . BREAST CANCER, HX OF 01/01/2010    Teena Irani, PTA/CLT [phone]  06/05/2015, 5:59 PM  [facility] [address] [place], [place], [address] Phone: [phone]   Fax:  [phone]

## 2015-06-05 NOTE — Telephone Encounter (Signed)
Rx eprescribed 

## 2015-06-06 ENCOUNTER — Encounter: Payer: Self-pay | Admitting: Nutrition

## 2015-06-06 NOTE — Patient Instructions (Signed)
Goals:  1. Follow Plate Method 2 Eat three meals per day. 3. Eat meals on schedule B/L/D 4. Drink water only. 5. Cut out snacks, junk food 6. Exercise 30 mintues 5 times per week. 7. Get A1C down to 5.7 % or below. 8. Lose 1-2 lbs per week. 9. Do not skip meals. 10. Take medications as prescribed: Metformin after breakfast and and bedtime.

## 2015-06-06 NOTE — Progress Notes (Signed)
  Medical Nutrition Therapy:  Appt start time: 1000 end time:  1030.   Assessment:  Primary concerns today: Diabetes. Walk in visit from Dr. Dorris Fetch. Unable to do safety questions. Will address at next visit.  PMH: Breast cancer, A1C 6%. Taking Metformin 1000 mg BID . Eats 2 meals per day. Not exercising.PMH: Hyperlipidemia strong family history. BMI  35 Diet is excessive in calories and inconsistent in CHO and low in fresh fruits and vegetables and whole grains.  Preferred Learning Style:    No preference indicated   Learning Readiness:   N  Ready  Change in progress   MEDICATIONS: see list   DIETARY INTAKE:   24-hr recall:  B ( AM): Skips Snk ( AM):   L ( PM): skips Snk ( PM):  D ( PM): toss salad, ranch dressing, manwich, unsweet tea Snk ( PM):  PB sandwich on white bread and Dt. gingerale. Beverages: water, diet sodas  Usual physical activity: ADL  Estimated energy needs: 1500 calories 170 g carbohydrates 112 g protein 42 g fat  Progress Towards Goal(s):  In progress.   Nutritional Diagnosis:  NB-1.1 Food and nutrition-related knowledge deficit As related to Diabetes.  As evidenced by A1C 6.%.    Intervention:  Nutrition and diabetes education on disease, My Plate, meal planning, CHO counting, portion sizes, target ranges for blood sugars, weight loss tips, benefits of exercise and need for balanced meals on schedule three times per day.. Discussed complications of DM. High Fiber diet due to heart disease in family and obesity.  Goals:  1. Follow Plate Method 2 Eat three meals per day. 3. Eat meals on schedule B/L/D 4. Drink water only. 5. Cut out snacks, junk food 6. Exercise 30 mintues 5 times per week. 7. Get A1C down to 5.7 % or below. 8. Lose 1-2 lbs per week. 9. Do not skip meals. 10. Take medications as prescribed: Metformin after breakfast and and bedtime.  Teaching Method Utilized: Visual Auditory Hands on  Handouts given during visit  include:  The Plate Method  Meal Plan Card  Diabetes instructions  Barriers to learning/adherence to lifestyle change: none  Demonstrated degree of understanding via:  Teach Back   Monitoring/Evaluation:  Dietary intake, exercise, meal planning, SBG, and body weight in 1 month(s).

## 2015-06-10 ENCOUNTER — Ambulatory Visit (HOSPITAL_COMMUNITY): Payer: BLUE CROSS/BLUE SHIELD | Admitting: Physical Therapy

## 2015-06-12 ENCOUNTER — Ambulatory Visit (HOSPITAL_COMMUNITY): Payer: BLUE CROSS/BLUE SHIELD

## 2015-06-17 ENCOUNTER — Ambulatory Visit (HOSPITAL_COMMUNITY): Payer: BLUE CROSS/BLUE SHIELD | Admitting: Physical Therapy

## 2015-06-17 DIAGNOSIS — R2681 Unsteadiness on feet: Secondary | ICD-10-CM

## 2015-06-17 DIAGNOSIS — M79604 Pain in right leg: Secondary | ICD-10-CM

## 2015-06-17 DIAGNOSIS — R29898 Other symptoms and signs involving the musculoskeletal system: Secondary | ICD-10-CM

## 2015-06-17 DIAGNOSIS — R6889 Other general symptoms and signs: Secondary | ICD-10-CM

## 2015-06-17 DIAGNOSIS — M79605 Pain in left leg: Secondary | ICD-10-CM

## 2015-06-17 DIAGNOSIS — Z853 Personal history of malignant neoplasm of breast: Secondary | ICD-10-CM

## 2015-06-17 NOTE — Therapy (Signed)
[facility] [address] [place], [place], [address] Phone: [phone]   Fax:  [phone]  Physical Therapy Treatment  Patient Details  Name: Annette [name] MRN: [id number] Date of Birth: March 12, 1961 Referring Provider:  Patrici Ranks, MD  Encounter Date: 06/17/2015      PT End of Session - 06/17/15 1432    Visit Number 11   Number of Visits 16   Date for PT Re-Evaluation 06/19/15   Authorization Type [employer] Anthem (visit limit 60 combined OT/PT)   Authorization Time Period 06/11/15 to 08/11/15   PT Start Time 1406   PT Stop Time 1450   PT Time Calculation (min) 44 min   Equipment Utilized During Treatment Gait belt   Activity Tolerance Patient tolerated treatment well   Behavior During Therapy [facility] for tasks assessed/performed      Past Medical History  Diagnosis Date  . Sleep apnea 2010    Problems with CPAP  . GERD (gastroesophageal reflux disease)   . Menieres disease     Controlled with triamterene   . Pituitary insufficiency   . IBS (irritable bowel syndrome)   . Depression   . Obesity   . Migraines   . Palpitations   . Bruises easily   . Breast cancer 2007    History of  XRT, onTamoxifen  . Diabetes mellitus     prediabetic  . Hypothyroidism     following chemo amnd radiaition for breast cancer, needed replacement short term  . Hypothyroidism (acquired)     replaced x 1 year    Past Surgical History  Procedure Laterality Date  . Thyroidectomy, partial  11/05/2010    Benign disease  . Appendectomy  06/12/08  . Cholecystectomy  1985  . Breast surgery  2007    Left lumpectomy  . Breast reconstruction      Left reconstructive  . Breast surgery      Mammosite - right side  . Abdominal hysterectomy  1998    Benign, fibroids  . Adrenalectomy  02/16/2012    [facility], splemic trauma, 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  . Splenectomy, total  3/47/4259    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     There were no vitals filed for this visit.  Visit Diagnosis:  Weakness of both legs  Decreased functional activity tolerance  Unsteadiness  Pain of left lower extremity  Pain of right lower extremity  History of breast cancer      Subjective Assessment - 06/17/15 1543    Subjective Pt states she was in [place] all last week.  States she had good and bad days with her Lt knee pain.  Pt showed 21 minutes late for appointment due to traffic.  Pt states her Lt knee is 2/10 currently.                          [facility] Adult PT Treatment/Exercise - 06/17/15 1410    Knee/Hip Exercises: Stretches   Active Hamstring Stretch Both;30 seconds;3 reps   Active Hamstring Stretch Limitations 12 inch box   Gastroc Stretch Both;3 reps;30 seconds   Gastroc Stretch Limitations slantboard    Knee/Hip Exercises: Aerobic   Nustep nustep level 3 hills #3 10" LE only   Knee/Hip Exercises: Standing   Heel Raises 15 reps   Heel Raises Limitations Proper lifting blue ball from 12 in then heel raise   Forward Lunges Both;15 reps   Forward Lunges  Limitations on floor   Side Lunges Both;15 reps   Side Lunges Limitations on floor   Lateral Step Up Both;Hand Hold: 1;Step Height: 6";15 reps   Lateral Step Up Limitations 7" staircase   Forward Step Up Both;1 set;Step Height: 6";15 reps   Forward Step Up Limitations 7" staircase                  PT Short Term Goals - 05/15/15 1328    PT SHORT TERM GOAL #1   Title Patient will demosntrate 5/5 strength in bilateral lower extremities and at least 4+/5 strength in proximal musculature, also at least 4-/5 strength in core     Time 4   Period Weeks   Status On-going   PT SHORT TERM GOAL #2   Title Patient will demonstrate bilateral hip IR of at least 40 degrees to assist in overall mobility and mechanics    Time 4   Period Weeks   Status On-going   PT SHORT TERM GOAL #3   Title Patient will demonstrate the ability to  maintain SLS at least 45 seconds each LE    Time 4   Period Weeks   Status On-going   PT SHORT TERM GOAL #4   Title Patient to be independent in correctly and consistently performing appropriate HEP, to be updated PRN    Baseline 8/11-  doing at least 5 days per week   Time 4   Period Weeks   Status Achieved           PT Long Term Goals - 05/15/15 1332    PT LONG TERM GOAL #1   Title Patient will consistently be able to stand for at least 60 minutes with pain 0/10  to facilitate functional task performance such as cooking or home maintenance    Time 8   Period Weeks   Status On-going   PT LONG TERM GOAL #2   Title Patient will consistently be able to ambulate for at least 45 minutes without seated rest in order to increase ease of functional activities such as going to the store    Time 8   Period Weeks   Status On-going   PT LONG TERM GOAL #3   Title Patient will score at least 110 on FACIT-F with all sections answered    Time 8   Period Weeks   Status On-going   PT LONG TERM GOAL #4   Title Patient will demonstrate improved functional activity tolerance as evidenced by an abiltiy to regularly participate in functional exercise such as swimming for at least 45 minutes, 3 times a week    Baseline 8/11- patient reports she is able to remain active in the pool for 3 hours, multiple days per week ; reports she was able to walk for a couple hours in [place] the other week but did have some    Time 8   Period Weeks   Status Achieved               Plan - 06/17/15 1545    Clinical Impression Statement Continued with functional strengthening and stretches.  PT presents independence with established exercises.    Able to increased reps and difficulty of most exericses without increased pain or challenge.  Pt expressed interest in cybex equiptment   PT Next Visit Plan Continue functional strength and stretching, continue to progress balance. May want to do re-evaluation as it  has been 4 weeks since last re-evaluation.  Introduce to  cybex equipment for LE's and discuss joining [employer].        Problem List Patient Active Problem List   Diagnosis Date Noted  . Atypical chest pain 03/30/2015  . Special screening for malignant neoplasms, colon 03/30/2015  . Palpitations 03/27/2015  . Hyperlipidemia LDL goal <100 07/15/2014  . Need for prophylactic vaccination and inoculation against influenza 07/15/2014  . GERD (gastroesophageal reflux disease) 10/09/2013  . IBS (irritable bowel syndrome) 10/09/2013  . GAD (generalized anxiety disorder) 05/14/2013  . Metabolic syndrome X 96/29/5284  . HTN, goal below 130/80 01/09/2013  . Nicotine dependence 01/09/2013  . Insomnia 08/14/2012  . Prediabetes 04/16/2012  . Routine general medical examination at a health care facility 04/16/2012  . Chest pain, atypical 11/29/2011  . Adrenal adenoma 08/06/2011  . Meniere's disease 04/22/2011  . Sleep apnea 04/22/2011  . Breast cancer 12/07/2010  . THYROID MASS 09/18/2010  . ABNORMAL THYROID FUNCTION TESTS 08/17/2010  . Hypothyroidism 01/15/2010  . Vitamin D deficiency 01/15/2010  . Morbid obesity 01/01/2010  . IBS 01/01/2010  . BREAST CANCER, HX OF 01/01/2010    Teena Irani, PTA/CLT [phone]  06/17/2015, 4:09 PM  [facility] [address] [place], [place], [address] Phone: [phone]   Fax:  [phone]

## 2015-06-19 ENCOUNTER — Ambulatory Visit (HOSPITAL_COMMUNITY): Payer: BLUE CROSS/BLUE SHIELD | Admitting: Physical Therapy

## 2015-06-19 DIAGNOSIS — M79604 Pain in right leg: Secondary | ICD-10-CM

## 2015-06-19 DIAGNOSIS — R2681 Unsteadiness on feet: Secondary | ICD-10-CM

## 2015-06-19 DIAGNOSIS — M79605 Pain in left leg: Secondary | ICD-10-CM

## 2015-06-19 DIAGNOSIS — R29898 Other symptoms and signs involving the musculoskeletal system: Secondary | ICD-10-CM

## 2015-06-19 DIAGNOSIS — R6889 Other general symptoms and signs: Secondary | ICD-10-CM

## 2015-06-19 DIAGNOSIS — Z853 Personal history of malignant neoplasm of breast: Secondary | ICD-10-CM

## 2015-06-19 NOTE — Therapy (Signed)
[facility] [address] [place], [place], [address] Phone: [phone]   Fax:  [phone]  Physical Therapy Treatment (Discharge Assessment)  Patient Details  Name: Annette [name] MRN: [id number] Date of Birth: [birth year] Referring Provider:  Patrici Ranks, MD  Encounter Date: 06/19/2015      PT End of Session - 06/19/15 1342    Visit Number 12   Number of Visits 12   Authorization Type [employer] (visit limit 60 combined OT/PT)   Authorization Time Period 06/11/15 to 08/11/15   PT Start Time 1302   PT Stop Time 1342   PT Time Calculation (min) 40 min   Activity Tolerance Patient tolerated treatment well   Behavior During Therapy [facility] for tasks assessed/performed      Past Medical History  Diagnosis Date  . Sleep apnea 2010    Problems with CPAP  . GERD (gastroesophageal reflux disease)   . Menieres disease     Controlled with triamterene   . Pituitary insufficiency   . IBS (irritable bowel syndrome)   . Depression   . Obesity   . Migraines   . Palpitations   . Bruises easily   . Breast cancer 2007    History of  XRT, onTamoxifen  . Diabetes mellitus     prediabetic  . Hypothyroidism     following chemo amnd radiaition for breast cancer, needed replacement short term  . Hypothyroidism (acquired)     replaced x 1 year    Past Surgical History  Procedure Laterality Date  . Thyroidectomy, partial  11/05/2010    Benign disease  . Appendectomy  06/12/08  . Cholecystectomy  1985  . Breast surgery  2007    Left lumpectomy  . Breast reconstruction      Left reconstructive  . Breast surgery      Mammosite - right side  . Abdominal hysterectomy  1998    Benign, fibroids  . Adrenalectomy  02/16/2012    [facility], splemic trauma, 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  . 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  Visit Diagnosis:   Weakness of both legs  Decreased functional activity tolerance  Unsteadiness  Pain of left lower extremity  Pain of right lower extremity  History of breast cancer      Subjective Assessment - 06/19/15 1303    Subjective Patient reports she is doing well today, just having a little bit of pain in her knee; had a moment last week wehre she got up and could barely stand on her L knee. Reports that the pain fluctuates in general.    Pertinent History Diagnosed with breast cancer in 2007; since then has had pain on a daily basis, mostly in legs but sometimes in elbows too. Pain peaks and goes away, not really predictable but hips do ache on a daily basis.    How long can you stand comfortably? 9/14- 30 minutes to 45 minutes    How long can you walk comfortably? 9/14- 60 minutes    Currently in Pain? Yes   Pain Score 1    Pain Location Knee   Pain Orientation Left            [facility] PT Assessment - 06/19/15 0001    Observation/Other Assessments   Observations TUG 6.2, 6.2, 5.7; SLS 28 L, 35 R    Focus on Therapeutic Outcomes (FOTO)  FACIT-F 148; Fatigue 1/4/2; Pain VAS 1/8/1; Distress VAS 0/0/0   AROM   Right Hip Internal Rotation  37   Left Hip Internal Rotation  38   Right Ankle Dorsiflexion 20   Left Ankle Dorsiflexion 20   Strength   Right Hip Flexion 5/5   Right Hip Extension 4/5   Right Hip ABduction 4+/5   Left Hip Flexion 5/5   Left Hip Extension 3+/5   Left Hip ABduction 5/5   Right Knee Flexion 5/5   Right Knee Extension 5/5   Left Knee Flexion 5/5   Left Knee Extension 4+/5   Right Ankle Dorsiflexion 5/5   Left Ankle Dorsiflexion 5/5                     [facility] Adult PT Treatment/Exercise - 06/19/15 0001    Knee/Hip Exercises: Stretches   Active Hamstring Stretch Both;30 seconds;3 reps   Active Hamstring Stretch Limitations 12 inch box   Gastroc Stretch Both;3 reps;30 seconds   Gastroc Stretch Limitations slantboard    Knee/Hip Exercises:  Aerobic   Nustep nustep level 3 hills #3 10" LE only                PT Education - 06/19/15 1342    Education provided Yes   Education Details DC today, advised to remain active as possible, keep up wtih HEP    Person(s) Educated Patient   Methods Explanation   Comprehension Verbalized understanding          PT Short Term Goals - 06/19/15 1319    PT SHORT TERM GOAL #1   Title Patient will demosntrate 5/5 strength in bilateral lower extremities and at least 4+/5 strength in proximal musculature, also at least 4-/5 strength in core     Time 4   Period Weeks   Status Partially Met   PT SHORT TERM GOAL #2   Title Patient will demonstrate bilateral hip IR of at least 40 degrees to assist in overall mobility and mechanics    Time 4   Period Weeks   Status Not Met   PT SHORT TERM GOAL #3   Title Patient will demonstrate the ability to maintain SLS at least 45 seconds each LE    Baseline 9/15- 35 at best    Time 4   Period Weeks   Status Not Met   PT SHORT TERM GOAL #4   Title Patient to be independent in correctly and consistently performing appropriate HEP, to be updated PRN    Time 4   Period Weeks   Status Achieved           PT Long Term Goals - 06/19/15 1320    PT LONG TERM GOAL #1   Title Patient will consistently be able to stand for at least 60 minutes with pain 0/10  to facilitate functional task performance such as cooking or home maintenance    Baseline 9/15- pain is still elevated with static stance, but has gotten better    Time 8   Period Weeks   Status Partially Met   PT LONG TERM GOAL #2   Title Patient will consistently be able to ambulate for at least 45 minutes without seated rest in order to increase ease of functional activities such as going to the store    Time 8   Period Weeks   Status Achieved   PT LONG TERM GOAL #3   Title Patient will score at least 110 on  FACIT-F with all sections answered    Baseline 9/15- FACIT-F 148   Time 8    Period Weeks   Status Achieved   PT LONG TERM GOAL #4   Title Patient will demonstrate improved functional activity tolerance as evidenced by an abiltiy to regularly participate in functional exercise such as swimming for at least 45 minutes, 3 times a week    Time 8   Period Weeks   Status Achieved               Plan - 06/19/15 1342    Clinical Impression Statement Discharge assessment performed today. Patient shows significant improvement in strength, functional activity tolerance, reduction in pain, posture, and overall functional task performance. Patient remains motivated to continue to perform exercise and keep up with overall HEP , remaining active at this time. Patient is fully appropriate for DC today.    Pt will benefit from skilled therapeutic intervention in order to improve on the following deficits Abnormal gait;Hypomobility;Decreased activity tolerance;Decreased strength;Pain;Decreased balance;Decreased mobility;Improper body mechanics;Decreased coordination;Difficulty walking;Postural dysfunction;Impaired flexibility   Rehab Potential Good   PT Frequency 2x / week   PT Duration 4 weeks   PT Treatment/Interventions ADLs/Self Care Home Management;Gait training;Stair training;Functional mobility training;Therapeutic activities;Therapeutic exercise;Balance training;Neuromuscular re-education;Patient/family education;Manual techniques   PT Next Visit Plan DC today.    PT Home Exercise Plan given    Consulted and Agree with Plan of Care Patient        Problem List Patient Active Problem List   Diagnosis Date Noted  . Atypical chest pain 03/30/2015  . Special screening for malignant neoplasms, colon 03/30/2015  . Palpitations 03/27/2015  . Hyperlipidemia LDL goal <100 07/15/2014  . Need for prophylactic vaccination and inoculation against influenza 07/15/2014  . GERD (gastroesophageal reflux disease) 10/09/2013  . IBS (irritable bowel syndrome) 10/09/2013  . GAD  (generalized anxiety disorder) 05/14/2013  . Metabolic syndrome X 82/42/3536  . HTN, goal below 130/80 01/09/2013  . Nicotine dependence 01/09/2013  . Insomnia 08/14/2012  . Prediabetes 04/16/2012  . Routine general medical examination at a health care facility 04/16/2012  . Chest pain, atypical 11/29/2011  . Adrenal adenoma 08/06/2011  . Meniere's disease 04/22/2011  . Sleep apnea 04/22/2011  . Breast cancer 12/07/2010  . THYROID MASS 09/18/2010  . ABNORMAL THYROID FUNCTION TESTS 08/17/2010  . Hypothyroidism 01/15/2010  . Vitamin D deficiency 01/15/2010  . Morbid obesity 01/01/2010  . IBS 01/01/2010  . BREAST CANCER, HX OF 01/01/2010    PHYSICAL THERAPY DISCHARGE SUMMARY  Visits from Start of Care: 12  Current functional level related to goals / functional outcomes: Patient is able to do everything she would like and that she needs to do, is limited by fluctuating levels of knee pain however    Remaining deficits: Mild weakness, some postural impairment, occasional knee pain    Education / Equipment: Advised to keep up with HEP, remain active; performed exit FACIT and STAR exit survey  Plan: Patient agrees to discharge.  Patient goals were partially met. Patient is being discharged due to being pleased with the current functional level.  ?????       Deniece Ree PT, DPT [facility] [address] [place], [place], [address] Phone: [phone]   Fax:  [phone]

## 2015-06-24 ENCOUNTER — Ambulatory Visit (HOSPITAL_COMMUNITY): Payer: BLUE CROSS/BLUE SHIELD | Admitting: Physical Therapy

## 2015-06-26 ENCOUNTER — Ambulatory Visit (HOSPITAL_COMMUNITY): Payer: BLUE CROSS/BLUE SHIELD | Admitting: Physical Therapy

## 2015-06-30 ENCOUNTER — Ambulatory Visit (HOSPITAL_COMMUNITY): Payer: BLUE CROSS/BLUE SHIELD | Admitting: Physical Therapy

## 2015-07-17 LAB — LIPID PANEL
Cholesterol: 196 mg/dL (ref 0–200)
LDL Cholesterol: 132 mg/dL

## 2015-07-17 LAB — HEMOGLOBIN A1C: Hgb A1c MFr Bld: 6.4 % — AB (ref 4.0–6.0)

## 2015-07-21 ENCOUNTER — Ambulatory Visit: Payer: BLUE CROSS/BLUE SHIELD | Admitting: Nutrition

## 2015-07-23 ENCOUNTER — Ambulatory Visit: Payer: BLUE CROSS/BLUE SHIELD | Admitting: Family Medicine

## 2015-08-12 ENCOUNTER — Other Ambulatory Visit: Payer: Self-pay | Admitting: Family Medicine

## 2015-08-12 ENCOUNTER — Other Ambulatory Visit: Payer: Self-pay | Admitting: "Endocrinology

## 2015-08-13 ENCOUNTER — Ambulatory Visit: Payer: BLUE CROSS/BLUE SHIELD | Admitting: Family Medicine

## 2015-08-21 ENCOUNTER — Ambulatory Visit: Payer: Self-pay | Admitting: "Endocrinology

## 2015-08-25 ENCOUNTER — Other Ambulatory Visit: Payer: Self-pay | Admitting: Family Medicine

## 2015-09-03 ENCOUNTER — Ambulatory Visit (INDEPENDENT_AMBULATORY_CARE_PROVIDER_SITE_OTHER): Payer: BLUE CROSS/BLUE SHIELD | Admitting: Family Medicine

## 2015-09-03 ENCOUNTER — Encounter: Payer: Self-pay | Admitting: Family Medicine

## 2015-09-03 VITALS — BP 100/60 | HR 72 | Resp 18 | Ht 67.0 in | Wt 215.0 lb

## 2015-09-03 DIAGNOSIS — E785 Hyperlipidemia, unspecified: Secondary | ICD-10-CM | POA: Diagnosis not present

## 2015-09-03 DIAGNOSIS — Z853 Personal history of malignant neoplasm of breast: Secondary | ICD-10-CM

## 2015-09-03 DIAGNOSIS — F17218 Nicotine dependence, cigarettes, with other nicotine-induced disorders: Secondary | ICD-10-CM

## 2015-09-03 DIAGNOSIS — G4700 Insomnia, unspecified: Secondary | ICD-10-CM

## 2015-09-03 DIAGNOSIS — N644 Mastodynia: Secondary | ICD-10-CM

## 2015-09-03 DIAGNOSIS — I10 Essential (primary) hypertension: Secondary | ICD-10-CM

## 2015-09-03 DIAGNOSIS — F411 Generalized anxiety disorder: Secondary | ICD-10-CM

## 2015-09-03 NOTE — Patient Instructions (Addendum)
F/U in 4.5 months, call if you need me before  You will be referred for diagnostic mammogram due to new left breast pain worse in past 1 week  For cut on right hand , keep clean and continue topical antibiitic sparingly.   Discuss alternate medcication for blood sugar with Dr Dorris Fetch  Consider setting a quit date for cigarettes this is still an ongoing battle  Ensure mental health is preserved  And get help where you can if  Needed.  It is important that you exercise regularly at least 30 minutes 7 times a week. If you develop chest pain, have severe difficulty breathing, or feel very tired, stop exercising immediately and seek medical attention   Please work on good  health habits so that your health will improve. 1. Commitment to daily physical activity for 30 to 60  minutes, if you are able to do this.  2. Commitment to wise food choices. Aim for half of your  food intake to be vegetable and fruit, one quarter starchy foods, and one quarter protein. Try to eat on a regular schedule  3 meals per day, snacking between meals should be limited to vegetables or fruits or small portions of nuts. 64 ounces of water per day is generally recommended, unless you have specific health conditions, like heart failure or kidney failure where you will need to limit fluid intake.  3. Commitment to sufficient and a  good quality of physical and mental rest daily, generally between 6 to 8 hours per day.  WITH PERSISTANCE AND PERSEVERANCE, THE IMPOSSIBLE , BECOMES THE NORM!

## 2015-09-03 NOTE — Progress Notes (Signed)
Subjective:    Patient ID: Annette [name], female    DOB: 1960-12-26, 54 y.o.   MRN: [id number]  HPI   Annette [name]     MRN: [id number]      DOB: July 20, 1961   HPI [name] is here for follow up and re-evaluation of chronic medical conditions, medication management and review of any available recent lab and radiology data.  Preventive health is updated, specifically  Cancer screening and Immunization.   Questions or concerns regarding consultations or procedures which the PT has had in the interim are  Addressed.Seeing endo and has concerns re intolerance to metformin , will further address with MD The PT denies any adverse reactions to current medications since the last visit.  C/o new left breast pain and has a past h/o breast cancer with ongoing nicotine use C/o new stresses mentally on a job which she recently started , feels negative comments are targeted at her which are racially motivated, I have advised that she tke this up with the HR department a t her workplace  ROS: Denies recent fever or chills. Denies sinus pressure, nasal congestion, ear pain or sore throat. Denies chest congestion, productive cough or wheezing. Denies chest pains, palpitations and leg swelling Denies abdominal pain, nausea, vomiting,diarrhea or constipation.   Denies dysuria, frequency, hesitancy or incontinence. Denies joint pain, swelling and limitation in mobility. Denies headaches, seizures, numbness, or tingling. Denies skin break down or rash.   PE  BP 100/60 mmHg  Pulse 72  Resp 18  Ht 5\' 7"  (1.702 m)  Wt 215 lb (97.523 kg)  BMI 33.67 kg/m2  SpO2 96%  Patient alert and oriented and in no cardiopulmonary distress.  HEENT: No facial asymmetry, EOMI,   oropharynx pink and moist.  Neck supple no JVD, no mass.  Chest: Clear to auscultation bilaterally.  CVS: S1, S2 no murmurs, no S3.Regular rate.  ABD: Soft non tender.   Ext: No edema  MS: Adequate ROM spine,  shoulders, hips and knees.  Skin: Intact, no ulcerations or rash noted.  Psych: Good eye contact, normal affect. Memory intact not anxious or depressed appearing.  CNS: CN 2-12 intact, power,  normal throughout.no focal deficits noted.   Assessment & Plan  HTN, goal below 130/80 Controlled, no change in medication DASH diet and commitment to daily physical activity for a minimum of 30 minutes discussed and encouraged, as a part of hypertension management. The importance of attaining a healthy weight is also discussed.  BP/Weight 09/03/2015 06/02/2015 05/21/2015 04/04/2015 03/27/2015 02/21/2015 [id number]  Systolic BP 123XX123 - 123456 123XX123 123XX123 123456 [id number]  Diastolic BP 60 - 72 60 64 45 60  Wt. (Lbs) 215 219 217 221 221.12 221.1 212  BMI 33.67 35.36 35.04 35.69 35.71 35.7 34.23        Hyperlipidemia LDL goal <100 Hyperlipidemia:Low fat diet discussed and encouraged.   Lipid Panel  Lab Results  Component Value Date   CHOL 196 07/17/2015   HDL 52 11/15/2014   LDLCALC 132 07/17/2015   TRIG 125 11/15/2014   CHOLHDL 5.0 11/15/2014   Marked improvement , continue current management     Insomnia Sleep hygiene reviewed and written information offered also. Prescription sent for  medication needed.   Nicotine dependence Patient counseled for approximately 5 minutes regarding the health risks of ongoing nicotine use, specifically all types of cancer, heart disease, stroke and respiratory failure. The options available for help with cessation ,the behavioral changes to assist the process, and  the option to either gradully reduce usage  Or abruptly stop.is also discussed. Pt is also encouraged to set specific goals in number of cigarettes used daily, as well as to set a quit date.  Number of cigarettes/cigars currently smoking daily:10   Morbid obesity Mild improvement , is benefiting from dietary consult Patient re-educated about  the importance of commitment to a  minimum of 150 minutes  of exercise per week.  The importance of healthy food choices with portion control discussed. Encouraged to start a food diary, count calories and to consider  joining a support group. Sample diet sheets offered. Goals set by the patient for the next several months.   Weight /BMI 09/03/2015 06/02/2015 05/21/2015  WEIGHT 215 lb 219 lb 217 lb  HEIGHT 5\' 7"  5\' 6"  5\' 6"   BMI 33.67 kg/m2 35.36 kg/m2 35.04 kg/m2    Current exercise per week 90 minutes.   GAD (generalized anxiety disorder) Deteriorated in recent weeks, states she is being harassed on the job, advised to further discuss with HR dept, harassment she feels is race related  Denies suicidal or homicidal ideation, and is not hallucinating  BREAST CANCER, HX OF New complaint oif increased left breast pain, ongoing nicotine use , will request diagnostic mammogram, most recent mammogram less than 1 year ago  Breast pain, left New onset left breast pain in pt with past h/o left breast cancer,imaging needed      Review of Systems     Objective:   Physical Exam        Assessment & Plan:

## 2015-09-04 ENCOUNTER — Other Ambulatory Visit: Payer: Self-pay | Admitting: "Endocrinology

## 2015-09-04 DIAGNOSIS — E896 Postprocedural adrenocortical (-medullary) hypofunction: Secondary | ICD-10-CM

## 2015-09-16 ENCOUNTER — Encounter: Payer: Self-pay | Admitting: Family Medicine

## 2015-09-17 ENCOUNTER — Other Ambulatory Visit: Payer: Self-pay

## 2015-09-17 MED ORDER — NAPROXEN 500 MG PO TABS: 500.0000 mg | ORAL_TABLET | Freq: Two times a day (BID) | ORAL | Status: DC

## 2015-09-17 MED ORDER — LORAZEPAM 1 MG PO TABS: 1.0000 mg | ORAL_TABLET | Freq: Three times a day (TID) | ORAL | Status: DC | PRN

## 2015-10-06 ENCOUNTER — Encounter: Payer: Self-pay | Admitting: Family Medicine

## 2015-10-06 ENCOUNTER — Other Ambulatory Visit: Payer: Self-pay | Admitting: Family Medicine

## 2015-10-06 DIAGNOSIS — N644 Mastodynia: Secondary | ICD-10-CM

## 2015-10-06 DIAGNOSIS — Z853 Personal history of malignant neoplasm of breast: Secondary | ICD-10-CM

## 2015-10-06 NOTE — Assessment & Plan Note (Signed)
Controlled, no change in medication DASH diet and commitment to daily physical activity for a minimum of 30 minutes discussed and encouraged, as a part of hypertension management. The importance of attaining a healthy weight is also discussed.  BP/Weight 09/03/2015 06/02/2015 05/21/2015 04/04/2015 03/27/2015 02/21/2015 [id number]  Systolic BP 123XX123 - 123456 123XX123 123XX123 123456 [id number]  Diastolic BP 60 - 72 60 64 45 60  Wt. (Lbs) 215 219 217 221 221.12 221.1 212  BMI 33.67 35.36 35.04 35.69 35.71 35.7 34.23

## 2015-10-06 NOTE — Assessment & Plan Note (Signed)
Sleep hygiene reviewed and written information offered also. Prescription sent for  medication needed.  

## 2015-10-06 NOTE — Assessment & Plan Note (Signed)
Hyperlipidemia:Low fat diet discussed and encouraged.   Lipid Panel  Lab Results  Component Value Date   CHOL 196 07/17/2015   HDL 52 11/15/2014   LDLCALC 132 07/17/2015   TRIG 125 11/15/2014   CHOLHDL 5.0 11/15/2014   Marked improvement , continue current management

## 2015-10-06 NOTE — Assessment & Plan Note (Signed)
Deteriorated in recent weeks, states she is being harassed on the job, advised to further discuss with HR dept, harassment she feels is race related  Denies suicidal or homicidal ideation, and is not hallucinating

## 2015-10-06 NOTE — Assessment & Plan Note (Signed)
New complaint oif increased left breast pain, ongoing nicotine use , will request diagnostic mammogram, most recent mammogram less than 1 year ago

## 2015-10-06 NOTE — Assessment & Plan Note (Signed)

## 2015-10-06 NOTE — Assessment & Plan Note (Signed)
New onset left breast pain in pt with past h/o left breast cancer,imaging needed

## 2015-10-06 NOTE — Assessment & Plan Note (Signed)
Mild improvement , is benefiting from dietary consult Patient re-educated about  the importance of commitment to a  minimum of 150 minutes of exercise per week.  The importance of healthy food choices with portion control discussed. Encouraged to start a food diary, count calories and to consider  joining a support group. Sample diet sheets offered. Goals set by the patient for the next several months.   Weight /BMI 09/03/2015 06/02/2015 05/21/2015  WEIGHT 215 lb 219 lb 217 lb  HEIGHT 5\' 7"  5\' 6"  5\' 6"   BMI 33.67 kg/m2 35.36 kg/m2 35.04 kg/m2    Current exercise per week 90 minutes.

## 2015-10-15 ENCOUNTER — Other Ambulatory Visit: Payer: Self-pay | Admitting: Family Medicine

## 2015-10-17 ENCOUNTER — Other Ambulatory Visit: Payer: Self-pay

## 2015-10-17 MED ORDER — TEMAZEPAM 15 MG PO CAPS: 15.0000 mg | ORAL_CAPSULE | Freq: Every evening | ORAL | Status: DC | PRN

## 2015-10-21 ENCOUNTER — Other Ambulatory Visit: Payer: Self-pay

## 2015-10-21 ENCOUNTER — Ambulatory Visit (HOSPITAL_COMMUNITY)
Admission: RE | Admit: 2015-10-21 | Discharge: 2015-10-21 | Disposition: A | Discharge status: Home / Self Care | Payer: BLUE CROSS/BLUE SHIELD | Source: Ambulatory Visit | Attending: Family Medicine | Admitting: Family Medicine

## 2015-10-21 DIAGNOSIS — N644 Mastodynia: Secondary | ICD-10-CM | POA: Insufficient documentation

## 2015-10-21 DIAGNOSIS — E896 Postprocedural adrenocortical (-medullary) hypofunction: Secondary | ICD-10-CM

## 2015-10-22 LAB — T4, FREE: Free T4: 1 ng/dL (ref 0.82–1.77)

## 2015-10-22 LAB — CORTISOL-AM, BLOOD: Cortisol - AM: 10.9 ug/dL (ref 6.2–19.4)

## 2015-10-24 ENCOUNTER — Other Ambulatory Visit: Payer: Self-pay | Admitting: Family Medicine

## 2015-11-21 ENCOUNTER — Other Ambulatory Visit: Payer: Self-pay | Admitting: Family Medicine

## 2015-12-05 ENCOUNTER — Encounter: Payer: Self-pay | Admitting: Family Medicine

## 2015-12-05 ENCOUNTER — Other Ambulatory Visit: Payer: Self-pay | Admitting: Family Medicine

## 2015-12-05 ENCOUNTER — Telehealth: Payer: Self-pay | Admitting: Family Medicine

## 2015-12-05 DIAGNOSIS — R7303 Prediabetes: Secondary | ICD-10-CM

## 2015-12-05 DIAGNOSIS — Z1159 Encounter for screening for other viral diseases: Secondary | ICD-10-CM

## 2015-12-05 DIAGNOSIS — K219 Gastro-esophageal reflux disease without esophagitis: Secondary | ICD-10-CM

## 2015-12-05 DIAGNOSIS — Z114 Encounter for screening for human immunodeficiency virus [HIV]: Secondary | ICD-10-CM

## 2015-12-05 DIAGNOSIS — E785 Hyperlipidemia, unspecified: Secondary | ICD-10-CM

## 2015-12-05 DIAGNOSIS — I10 Essential (primary) hypertension: Secondary | ICD-10-CM

## 2015-12-05 NOTE — Telephone Encounter (Signed)
Pls order for an April visit with me, CBC, fasting lipid, cmp, hBA1C, Hep C, HIV, appt needs to be scheduled also.

## 2015-12-08 NOTE — Addendum Note (Signed)
Addended by: Denman George B on: 12/08/2015 02:23 PM   Modules accepted: Orders

## 2015-12-08 NOTE — Telephone Encounter (Signed)
Labs ordered.  Mychart message sent along with voicemail left for patient to call and schedule appointment.

## 2015-12-10 ENCOUNTER — Ambulatory Visit (INDEPENDENT_AMBULATORY_CARE_PROVIDER_SITE_OTHER): Payer: BLUE CROSS/BLUE SHIELD | Admitting: Internal Medicine

## 2015-12-10 ENCOUNTER — Other Ambulatory Visit (INDEPENDENT_AMBULATORY_CARE_PROVIDER_SITE_OTHER): Payer: Self-pay | Admitting: Internal Medicine

## 2015-12-10 ENCOUNTER — Encounter (INDEPENDENT_AMBULATORY_CARE_PROVIDER_SITE_OTHER): Payer: Self-pay | Admitting: Internal Medicine

## 2015-12-10 ENCOUNTER — Encounter (INDEPENDENT_AMBULATORY_CARE_PROVIDER_SITE_OTHER): Payer: Self-pay | Admitting: *Deleted

## 2015-12-10 VITALS — BP 92/60 | HR 60 | Temp 98.0°F | Ht 66.0 in | Wt 208.7 lb

## 2015-12-10 DIAGNOSIS — R140 Abdominal distension (gaseous): Secondary | ICD-10-CM

## 2015-12-10 DIAGNOSIS — K219 Gastro-esophageal reflux disease without esophagitis: Secondary | ICD-10-CM | POA: Diagnosis not present

## 2015-12-10 DIAGNOSIS — R079 Chest pain, unspecified: Secondary | ICD-10-CM

## 2015-12-10 NOTE — Patient Instructions (Signed)
EGD. The risks and benefits such as perforation, bleeding, and infection were reviewed with the patient and is agreeable. 

## 2015-12-10 NOTE — Progress Notes (Signed)
Subjective:    Patient ID: Annette [name], female    DOB: 26-Jan-1961, 55 y.o.   MRN: [id number] Last weight 208.  HPI Here today for f/u. She was last seen in January of 2016. Hx of GERD and IBS.  Presents today with c/o hiatal hernia. She says she is miserable. Her back hurts. She is bloated. She has chest pain at times after she eats. She would like for Dr. Laural Golden to look at her hiatal hernia again. She says no matter what she eats she feels bloated. Takes Omeprazole for GERD which is controlled. Her appetite is not good, though she has not lost any weight. No dysphagia.  She usually has a BM daily and sometimes once a week. No melena or BRRB.  Family hx negative for CRC.  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 She had surgery at [facility] in May of 2013.    02/25/2011 PROCEDURE: Esophagogastroduodenoscopy.  INDICATION: Annette [name] is a 55 year old Caucasian female who has chronic GERD, who has been experiencing fullness and choking sensation in her throat since she had thyroid lobectomy in February of this year. Her PPI has been change. She is on Protonix 40 mg twice daily and states the heartburn is well controlled; however, this symptom has not improved. She had barium esophagogram, which was unremarkable. I was also told that she has dysphagia but today she denies the symptom. She is undergoing diagnostic EGD. Procedures risks were reviewed with the patient. Informed consent was obtained. FINAL DIAGNOSIS: Small sliding hiatal hernia; otherwise, normal Esophagogastroduodenoscopy.    07/29/2009 EGD/Colonoscopy Dr. Laural Golden:  Symptoms of GERD x 12 yrs.  Intermittent epigastric pain.  Intermittent left lower quadrant pain and diarrhea:  Serrated GE junction. Small sliding hiatal hernia. Erosive antral gastritis. Normal terminal ileum. Three small polyps ablated (splenic flexure, sigmoid, and  rectum).  Biopsy: all are tubular adenomas.       Review of Systems Past Medical History  Diagnosis Date  . Sleep apnea 2010    Problems with CPAP  . GERD (gastroesophageal reflux disease)   . Menieres disease     Controlled with triamterene   . 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[place])   . IBS (irritable bowel syndrome)   . Depression   . Obesity   . Migraines   . Palpitations   . Bruises easily   . Breast cancer ([place]) 2007    History of  XRT, onTamoxifen  . Diabetes mellitus     prediabetic  . Hypothyroidism     following chemo amnd radiaition for breast cancer, needed replacement short term  . Hypothyroidism (acquired)     replaced x 1 year    Past Surgical History  Procedure Laterality Date  . Thyroidectomy, partial  11/05/2010    Benign disease  . Appendectomy  06/12/08  . Cholecystectomy  1985  . Abdominal hysterectomy  1998    Benign, fibroids  . Adrenalectomy  02/16/2012    [facility], splemic trauma, 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  . 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 Breast surgery  2007    Left lumpectomy  . Breast reconstruction      Left reconstructive  . Breast surgery      Mammosite - right side    Allergies  Allergen Reactions  . Penicillins Swelling    Of face and tongue.  . Statins Other (See Comments)    Major  concerns re safety  . Tape Itching    Surgical adhesive  . Zetia [Ezetimibe] Other (See Comments)    Feels funny    Current Outpatient Prescriptions on File Prior to Visit  Medication Sig Dispense Refill  . Choline Fenofibrate (FENOFIBRIC ACID) 135 MG CPDR TAKE 1 CAPSULE (135 MG TOTAL) BY MOUTH DAILY. 30 capsule 5  . clotrimazole-betamethasone (LOTRISONE) cream Apply topically as needed. 30 g 1  . dicyclomine (BENTYL) 20 MG tablet Take 20 mg by mouth as needed. Reported on 12/10/2015    . LORazepam (ATIVAN) 1 MG tablet Take 1 tablet (1 mg total) by mouth every 8 (eight) hours as needed for  anxiety (and/or nausea). 30 tablet 1  . metFORMIN (GLUCOPHAGE) 500 MG tablet TAKE ONE TABLET BY MOUTH TWICE DAILY (Patient taking differently: once a day) 60 tablet 2  . metoprolol tartrate (LOPRESSOR) 25 MG tablet TAKE 1 & 1/2 TABLETS BY MOUTH TWO TIMES A DAY 180 tablet 2  . naproxen (NAPROSYN) 500 MG tablet Take 1 tablet (500 mg total) by mouth 2 (two) times daily with a meal. (Patient taking differently: Take 500 mg by mouth as needed. ) 40 tablet 2  . omeprazole (PRILOSEC) 20 MG capsule TAKE 1 CAPSULE BY MOUTH TWO TIMES A DAY INSTEAD OF PROTONIX 180 capsule 0  . temazepam (RESTORIL) 15 MG capsule Take 1 capsule (15 mg total) by mouth at bedtime as needed. for sleep 90 capsule 1  . traZODone (DESYREL) 100 MG tablet TAKE 1 TABLET BY MOUTH EVERY DAY AT BEDTIME 90 tablet 2  . triamterene-hydrochlorothiazide (DYAZIDE) 37.5-25 MG capsule TAKE 1 CAPSULE BY MOUTH DAILY 90 capsule 2  . venlafaxine XR (EFFEXOR-XR) 75 MG 24 hr capsule TAKE 1 CAPSULE BY MOUTH DAILY 30 capsule 0  . Vitamin D, Ergocalciferol, (DRISDOL) 50000 UNITS CAPS capsule Take 1 capsule (50,000 Units total) by mouth every 7 (seven) days. 4 capsule 5   No current facility-administered medications on file prior to visit.        Objective:   Physical Exam Blood pressure 92/60, pulse 60, temperature 98 F (36.7 C), height 5\' 6"  (1.676 m), weight 208 lb 11.2 oz (94.666 kg). Alert and oriented. Skin warm and dry. Oral mucosa is moist.   . Sclera anicteric, conjunctivae is pink. Thyroid not enlarged. No cervical lymphadenopathy. Lungs clear. Heart regular rate and rhythm.  Abdomen is soft. Bowel sounds are positive. No hepatomegaly. No abdominal masses felt. Slight tenderness to epigastric region.  No edema to lower extremities.          Assessment & Plan:  GERD. Patient is concerned about her hiatal hernia. She would like Dr. Laural Golden to take a look.  EGD. The risks and benefits such as perforation, bleeding, and infection were  reviewed with the patient and is agreeable. Samples of FDGARD given to patient x 7 boxes with instructions.

## 2015-12-11 ENCOUNTER — Ambulatory Visit (INDEPENDENT_AMBULATORY_CARE_PROVIDER_SITE_OTHER): Payer: BLUE CROSS/BLUE SHIELD | Admitting: Internal Medicine

## 2015-12-19 ENCOUNTER — Encounter (HOSPITAL_COMMUNITY)
Admission: RE | Disposition: A | Discharge status: Home / Self Care | Payer: Self-pay | Source: Ambulatory Visit | Attending: Internal Medicine

## 2015-12-19 ENCOUNTER — Ambulatory Visit (HOSPITAL_COMMUNITY)
Admission: RE | Admit: 2015-12-19 | Discharge: 2015-12-19 | Disposition: A | Discharge status: Home / Self Care | Payer: BLUE CROSS/BLUE SHIELD | Source: Ambulatory Visit | Attending: Internal Medicine | Admitting: Internal Medicine

## 2015-12-19 ENCOUNTER — Encounter (HOSPITAL_COMMUNITY): Payer: Self-pay | Admitting: *Deleted

## 2015-12-19 DIAGNOSIS — G4730 Sleep apnea, unspecified: Secondary | ICD-10-CM | POA: Diagnosis not present

## 2015-12-19 DIAGNOSIS — Z79810 Long term (current) use of selective estrogen receptor modulators (SERMs): Secondary | ICD-10-CM | POA: Insufficient documentation

## 2015-12-19 DIAGNOSIS — E039 Hypothyroidism, unspecified: Secondary | ICD-10-CM | POA: Diagnosis not present

## 2015-12-19 DIAGNOSIS — F17210 Nicotine dependence, cigarettes, uncomplicated: Secondary | ICD-10-CM | POA: Insufficient documentation

## 2015-12-19 DIAGNOSIS — R1310 Dysphagia, unspecified: Secondary | ICD-10-CM | POA: Diagnosis not present

## 2015-12-19 DIAGNOSIS — R079 Chest pain, unspecified: Secondary | ICD-10-CM

## 2015-12-19 DIAGNOSIS — Z7984 Long term (current) use of oral hypoglycemic drugs: Secondary | ICD-10-CM | POA: Insufficient documentation

## 2015-12-19 DIAGNOSIS — R140 Abdominal distension (gaseous): Secondary | ICD-10-CM | POA: Insufficient documentation

## 2015-12-19 DIAGNOSIS — Z79899 Other long term (current) drug therapy: Secondary | ICD-10-CM | POA: Diagnosis not present

## 2015-12-19 DIAGNOSIS — F329 Major depressive disorder, single episode, unspecified: Secondary | ICD-10-CM | POA: Diagnosis not present

## 2015-12-19 DIAGNOSIS — E669 Obesity, unspecified: Secondary | ICD-10-CM | POA: Diagnosis not present

## 2015-12-19 DIAGNOSIS — K219 Gastro-esophageal reflux disease without esophagitis: Secondary | ICD-10-CM | POA: Insufficient documentation

## 2015-12-19 DIAGNOSIS — Z923 Personal history of irradiation: Secondary | ICD-10-CM | POA: Diagnosis not present

## 2015-12-19 DIAGNOSIS — Z853 Personal history of malignant neoplasm of breast: Secondary | ICD-10-CM | POA: Diagnosis not present

## 2015-12-19 DIAGNOSIS — E119 Type 2 diabetes mellitus without complications: Secondary | ICD-10-CM | POA: Diagnosis not present

## 2015-12-19 DIAGNOSIS — Z791 Long term (current) use of non-steroidal anti-inflammatories (NSAID): Secondary | ICD-10-CM | POA: Diagnosis not present

## 2015-12-19 DIAGNOSIS — K449 Diaphragmatic hernia without obstruction or gangrene: Secondary | ICD-10-CM | POA: Insufficient documentation

## 2015-12-19 HISTORY — PX: ESOPHAGEAL DILATION: SHX303

## 2015-12-19 HISTORY — PX: ESOPHAGOGASTRODUODENOSCOPY: SHX5428

## 2015-12-19 LAB — GLUCOSE, CAPILLARY: Glucose-Capillary: 101 mg/dL — ABNORMAL HIGH (ref 65–99)

## 2015-12-19 SURGERY — EGD (ESOPHAGOGASTRODUODENOSCOPY)
Anesthesia: Moderate Sedation

## 2015-12-19 MED ORDER — MEPERIDINE HCL 50 MG/ML IJ SOLN
INTRAMUSCULAR | Status: AC
  Filled 2015-12-19: qty 1

## 2015-12-19 MED ORDER — MIDAZOLAM HCL 5 MG/5ML IJ SOLN
INTRAMUSCULAR | Status: DC | PRN
  Administered 2015-12-19 (×5): 2 mg via INTRAVENOUS

## 2015-12-19 MED ORDER — STERILE WATER FOR IRRIGATION IR SOLN
Status: DC | PRN
  Administered 2015-12-19: 12:00:00

## 2015-12-19 MED ORDER — MIDAZOLAM HCL 5 MG/5ML IJ SOLN
INTRAMUSCULAR | Status: AC
  Filled 2015-12-19: qty 10

## 2015-12-19 MED ORDER — BUTAMBEN-TETRACAINE-BENZOCAINE 2-2-14 % EX AERO
INHALATION_SPRAY | CUTANEOUS | Status: DC | PRN
  Administered 2015-12-19: 2 via TOPICAL

## 2015-12-19 MED ORDER — SODIUM CHLORIDE 0.9 % IV SOLN
INTRAVENOUS | Status: DC
  Administered 2015-12-19: 1000 mL via INTRAVENOUS

## 2015-12-19 MED ORDER — MEPERIDINE HCL 50 MG/ML IJ SOLN
INTRAMUSCULAR | Status: DC | PRN
  Administered 2015-12-19 (×3): 25 mg via INTRAVENOUS

## 2015-12-19 NOTE — Discharge Instructions (Signed)
Resume usual medications. Keep Naprosyn use to minimum. Resume usual diet. No driving for 24 hours. Please call office with progress report in one week. Esophagogastroduodenoscopy, Care After Refer to this sheet in the next few weeks. These instructions provide you with information about caring for yourself after your procedure. Your health care provider may also give you more specific instructions. Your treatment has been planned according to current medical practices, but problems sometimes occur. Call your health care provider if you have any problems or questions after your procedure. WHAT TO EXPECT AFTER THE PROCEDURE After your procedure, it is typical to feel:  Soreness in your throat.  Pain with swallowing.  Sick to your stomach (nauseous).  Bloated.  Dizzy.  Fatigued. HOME CARE INSTRUCTIONS  Do not eat or drink anything until the numbing medicine (local anesthetic) has worn off and your gag reflex has returned. You will know that the local anesthetic has worn off when you can swallow comfortably.  Do not drive or operate machinery until directed by your health care provider.  Take medicines only as directed by your health care provider. SEEK MEDICAL CARE IF:   You cannot stop coughing.  You are not urinating at all or less than usual. SEEK IMMEDIATE MEDICAL CARE IF:  You have difficulty swallowing.  You cannot eat or drink.  You have worsening throat or chest pain.  You have dizziness or lightheadedness or you faint.  You have nausea or vomiting.  You have chills.  You have a fever.  You have severe abdominal pain.  You have black, tarry, or bloody stools.   This information is not intended to replace advice given to you by your health care provider. Make sure you discuss any questions you have with your health care provider.   Document Released: 09/06/2012 Document Revised: 10/11/2014 Document Reviewed: 09/06/2012 Elsevier Interactive Patient Education  [employer].

## 2015-12-19 NOTE — Op Note (Signed)
[facility] Patient Name: Annette [name] Procedure Date: 12/19/2015 11:52 AM MRN: [id number] Date of Birth: 10-02-1961 Attending MD: Hildred Laser , MD CSN: [id number] Age: 55 Admit Type: Outpatient Procedure:                Upper GI endoscopy Indications:              Dysphagia, Gastro-esophageal reflux disease,                            Abdominal bloating Providers:                Hildred Laser, MD, Renda Rolls, RN, Randa Spike,                            Technician Referring MD:              Medicines:                Cetacaine spray, Meperidine 75 mg IV, Midazolam 10                            mg IV Complications:            No immediate complications. Estimated Blood Loss:     Estimated blood loss: none. Procedure:                Pre-Anesthesia Assessment:                           - Prior to the procedure, a History and Physical                            was performed, and patient medications and                            allergies were reviewed. The patient's tolerance of                            previous anesthesia was also reviewed. The risks                            and benefits of the procedure and the sedation                            options and risks were discussed with the patient.                            All questions were answered, and informed consent                            was obtained. Prior Anticoagulants: The patient                            last took naproxen 14 days prior to the procedure.                            ASA Grade Assessment:  II - A patient with mild                            systemic disease. After reviewing the risks and                            benefits, the patient was deemed in satisfactory                            condition to undergo the procedure.                           After obtaining informed consent, the endoscope was                            passed under direct vision. Throughout the             procedure, the patient's blood pressure, pulse, and                            oxygen saturations were monitored continuously. The                            [id number]) scope was introduced through the                            mouth, and advanced to the third part of duodenum.                            The upper GI endoscopy was accomplished without                            difficulty. The patient tolerated the procedure                            well. Scope In: 12:16:26 PM Scope Out: 12:27:07 PM Total Procedure Duration: 0 hours 10 minutes 41 seconds  Findings:      The upper third of the esophagus, middle third of the esophagus and       lower third of the esophagus were normal. Estimated blood loss: none.       The scope was withdrawn. Dilation was performed with a Maloney dilator       with mild resistance at 20 Fr. The dilation site was examined following       endoscope reinsertion and showed no change. Estimated blood loss: none.      Esophagogastric landmarks were identified: the Z-line was found at 34 cm       from the incisors.      A 2 cm hiatal hernia was present.      The entire examined stomach was normal.      The duodenal bulb and second portion of the duodenum were normal. Impression:               - Normal upper third of esophagus, middle third of  esophagus and lower third of esophagus. Dilated.                           - Esophagogastric landmarks identified.                           - 2 cm hiatal hernia.                           - Normal stomach.                           - Normal duodenal bulb and second portion of the                            duodenum.                           - No specimens collected. Moderate Sedation:      Moderate (conscious) sedation was administered by the endoscopy nurse       and supervised by the endoscopist. The following parameters were       monitored: oxygen saturation, heart  rate, blood pressure, CO2       capnography and response to care. Total physician intraservice time was       24 minutes. Recommendation:           - Patient has a contact number available for                            emergencies. The signs and symptoms of potential                            delayed complications were discussed with the                            patient. Return to normal activities tomorrow.                            Written discharge instructions were provided to the                            patient.                           - Resume previous diet today.                           - Continue present medications. keep Naprosyn used                            a minimum. Procedure Code(s):        --- Professional ---                           [id number], Esophagogastroduodenoscopy, flexible,  transoral; diagnostic, including collection of                            specimen(s) by brushing or washing, when performed                            (separate procedure)                           43450, Dilation of esophagus, by unguided sound or                            bougie, single or multiple passes                           99152, Moderate sedation services provided by the                            same physician or other qualified health care                            professional performing the diagnostic or                            therapeutic service that the sedation supports,                            requiring the presence of an independent trained                            observer to assist in the monitoring of the                            patient's level of consciousness and physiological                            status; initial 15 minutes of intraservice time,                            patient age 24 years or older                           [phone], Moderate sedation services; each additional                            15  minutes intraservice time Diagnosis Code(s):        --- Professional ---                           K44.9, Diaphragmatic hernia without obstruction or                            gangrene                           R13.10, Dysphagia, unspecified  K21.9, Gastro-esophageal reflux disease without                            esophagitis                           R14.0, Abdominal distension (gaseous) CPT copyright 2016 American Medical Association. All rights reserved. The codes documented in this report are preliminary and upon coder review may  be revised to meet current compliance requirements. Hildred Laser, MD Hildred Laser, MD 12/19/2015 12:40:04 PM This report has been signed electronically. Number of Addenda: 0

## 2015-12-19 NOTE — H&P (Signed)
Annette [name] is an 55 y.o. female.   Chief Complaint: Patient is here for EGD and ED. HPI: Patient is 59 old Caucasian female was chronic GERD who now presents with dysphagia to solids that she's had intermittently for the last 3 months. She feels heartburn is well controlled with therapy. Her esophagus was last dilated in May 2012 and provided relief until recently. She also complains of postprandial bloating. She did lose 14 found when she returned to work but since then her weight has leveled off. She's had some nausea but no vomiting melena or rectal bleeding.  Past Medical History  Diagnosis Date  . Sleep apnea 2010    Problems with CPAP  . GERD (gastroesophageal reflux disease)   . Menieres disease     Controlled with triamterene   . 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[place])   . IBS (irritable bowel syndrome)   . Depression   . Obesity   . Migraines   . Palpitations   . Bruises easily   . Breast cancer ([place]) 2007    History of  XRT, onTamoxifen  . Diabetes mellitus     prediabetic  . Hypothyroidism     following chemo amnd radiaition for breast cancer, needed replacement short term  . Hypothyroidism (acquired)     replaced x 1 year    Past Surgical History  Procedure Laterality Date  . Thyroidectomy, partial  11/05/2010    Benign disease  . Appendectomy  06/12/08  . Cholecystectomy  1985  . Abdominal hysterectomy  1998    Benign, fibroids  . Adrenalectomy  02/16/2012    [facility], splemic trauma, 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  . 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 Breast surgery  2007    Left lumpectomy  . Breast reconstruction      Left reconstructive  . Breast surgery      Mammosite - right side    Family History  Problem Relation Age of Onset  . Diabetes Mother   . Heart disease Father   . Cancer Paternal Grandmother     Breast   Social History:  reports that she has been smoking Cigarettes.  She has a .15 pack-year  smoking history. She has never used smokeless tobacco. She reports that she does not drink alcohol or use illicit drugs.  Allergies:  Allergies  Allergen Reactions  . Penicillins Swelling    Of face and tongue.  . Statins Other (See Comments)    Major concerns re safety  . Tape Itching    Surgical adhesive  . Zetia [Ezetimibe] Other (See Comments)    Feels funny    Medications Prior to Admission  Medication Sig Dispense Refill  . Choline Fenofibrate (FENOFIBRIC ACID) 135 MG CPDR TAKE 1 CAPSULE (135 MG TOTAL) BY MOUTH DAILY. 30 capsule 5  . clotrimazole-betamethasone (LOTRISONE) cream Apply topically as needed. 30 g 1  . LORazepam (ATIVAN) 1 MG tablet Take 1 tablet (1 mg total) by mouth every 8 (eight) hours as needed for anxiety (and/or nausea). 30 tablet 1  . metFORMIN (GLUCOPHAGE) 500 MG tablet TAKE ONE TABLET BY MOUTH TWICE DAILY (Patient taking differently: once a day) 60 tablet 2  . metoprolol tartrate (LOPRESSOR) 25 MG tablet TAKE 1 & 1/2 TABLETS BY MOUTH TWO TIMES A DAY 180 tablet 2  . naproxen (NAPROSYN) 500 MG tablet Take 1 tablet (500 mg total) by mouth 2 (two) times daily with a meal. (Patient taking differently: Take 500  mg by mouth as needed. ) 40 tablet 2  . omeprazole (PRILOSEC) 20 MG capsule TAKE 1 CAPSULE BY MOUTH TWO TIMES A DAY INSTEAD OF PROTONIX 180 capsule 0  . temazepam (RESTORIL) 15 MG capsule Take 1 capsule (15 mg total) by mouth at bedtime as needed. for sleep 90 capsule 1  . traZODone (DESYREL) 100 MG tablet TAKE 1 TABLET BY MOUTH EVERY DAY AT BEDTIME 90 tablet 2  . triamterene-hydrochlorothiazide (DYAZIDE) 37.5-25 MG capsule TAKE 1 CAPSULE BY MOUTH DAILY 90 capsule 2  . venlafaxine XR (EFFEXOR-XR) 75 MG 24 hr capsule TAKE 1 CAPSULE BY MOUTH DAILY 30 capsule 0  . dicyclomine (BENTYL) 20 MG tablet Take 20 mg by mouth as needed. Reported on 12/10/2015    . Vitamin D, Ergocalciferol, (DRISDOL) 50000 UNITS CAPS capsule Take 1 capsule (50,000 Units total) by mouth  every 7 (seven) days. 4 capsule 5    Results for orders placed or performed during the hospital encounter of 12/19/15 (from the past 48 hour(s))  Glucose, capillary     Status: Abnormal   Collection Time: 12/19/15 11:13 AM  Result Value Ref Range   Glucose-Capillary 101 (H) 65 - 99 mg/dL   No results found.  ROS  Blood pressure 133/58, pulse 64, temperature 98.4 F (36.9 C), temperature source Oral, resp. rate 12, SpO2 99 %. Physical Exam  Constitutional: She appears well-developed and well-nourished.  HENT:  Mouth/Throat: Oropharynx is clear and moist.  Eyes: Conjunctivae are normal. No scleral icterus.  Neck: No thyromegaly present.  Cardiovascular: Normal rate, regular rhythm and normal heart sounds.   No murmur heard. Respiratory: Effort normal and breath sounds normal.  GI: Soft. She exhibits no distension and no mass. There is no tenderness.  Musculoskeletal: She exhibits no edema.  Neurological: She is alert.  Skin: Skin is warm and dry.     Assessment/Plan Dysphagia to solids in patient with chronic GERD. Postprandial bloating. EGD with ED.  Rogene Houston, MD 12/19/2015, 12:00 PM

## 2015-12-24 ENCOUNTER — Other Ambulatory Visit: Payer: Self-pay | Admitting: Family Medicine

## 2015-12-25 ENCOUNTER — Other Ambulatory Visit: Payer: Self-pay

## 2015-12-25 ENCOUNTER — Encounter (HOSPITAL_COMMUNITY): Payer: Self-pay | Admitting: Internal Medicine

## 2015-12-25 MED ORDER — VENLAFAXINE HCL ER 75 MG PO CP24: 75.0000 mg | ORAL_CAPSULE | Freq: Every day | ORAL | Status: DC

## 2016-01-07 ENCOUNTER — Ambulatory Visit: Payer: BLUE CROSS/BLUE SHIELD | Admitting: Family Medicine

## 2016-01-16 ENCOUNTER — Other Ambulatory Visit: Payer: Self-pay | Admitting: Family Medicine

## 2016-02-12 ENCOUNTER — Ambulatory Visit (INDEPENDENT_AMBULATORY_CARE_PROVIDER_SITE_OTHER): Payer: BLUE CROSS/BLUE SHIELD | Admitting: Family Medicine

## 2016-02-12 ENCOUNTER — Encounter: Payer: Self-pay | Admitting: Family Medicine

## 2016-02-12 VITALS — BP 114/72 | HR 83 | Resp 16 | Ht 66.0 in | Wt 208.9 lb

## 2016-02-12 DIAGNOSIS — G4700 Insomnia, unspecified: Secondary | ICD-10-CM

## 2016-02-12 DIAGNOSIS — I10 Essential (primary) hypertension: Secondary | ICD-10-CM

## 2016-02-12 DIAGNOSIS — R7303 Prediabetes: Secondary | ICD-10-CM | POA: Diagnosis not present

## 2016-02-12 DIAGNOSIS — E039 Hypothyroidism, unspecified: Secondary | ICD-10-CM | POA: Diagnosis not present

## 2016-02-12 DIAGNOSIS — F411 Generalized anxiety disorder: Secondary | ICD-10-CM

## 2016-02-12 DIAGNOSIS — E559 Vitamin D deficiency, unspecified: Secondary | ICD-10-CM

## 2016-02-12 DIAGNOSIS — K219 Gastro-esophageal reflux disease without esophagitis: Secondary | ICD-10-CM

## 2016-02-12 DIAGNOSIS — E8881 Metabolic syndrome: Secondary | ICD-10-CM

## 2016-02-12 DIAGNOSIS — E785 Hyperlipidemia, unspecified: Secondary | ICD-10-CM

## 2016-02-12 DIAGNOSIS — F17218 Nicotine dependence, cigarettes, with other nicotine-induced disorders: Secondary | ICD-10-CM

## 2016-02-12 DIAGNOSIS — G4730 Sleep apnea, unspecified: Secondary | ICD-10-CM

## 2016-02-12 MED ORDER — TEMAZEPAM 15 MG PO CAPS: 15.0000 mg | ORAL_CAPSULE | Freq: Every evening | ORAL | Status: DC | PRN

## 2016-02-12 MED ORDER — FENOFIBRIC ACID 135 MG PO CPDR: DELAYED_RELEASE_CAPSULE | ORAL | Status: DC

## 2016-02-12 MED ORDER — VENLAFAXINE HCL ER 75 MG PO CP24: 75.0000 mg | ORAL_CAPSULE | Freq: Every day | ORAL | Status: DC

## 2016-02-12 MED ORDER — TRIAMTERENE-HCTZ 37.5-25 MG PO CAPS: 1.0000 | ORAL_CAPSULE | Freq: Every day | ORAL | Status: DC

## 2016-02-12 MED ORDER — METOPROLOL TARTRATE 25 MG PO TABS: ORAL_TABLET | ORAL | Status: DC

## 2016-02-12 MED ORDER — TRAZODONE HCL 100 MG PO TABS: 100.0000 mg | ORAL_TABLET | Freq: Every day | ORAL | Status: DC

## 2016-02-12 MED ORDER — VITAMIN D (ERGOCALCIFEROL) 1.25 MG (50000 UNIT) PO CAPS: 50000.0000 [IU] | ORAL_CAPSULE | ORAL | Status: DC

## 2016-02-12 MED ORDER — OMEPRAZOLE 20 MG PO CPDR: DELAYED_RELEASE_CAPSULE | ORAL | Status: DC

## 2016-02-12 MED ORDER — BUSPIRONE HCL 5 MG PO TABS: 5.0000 mg | ORAL_TABLET | Freq: Three times a day (TID) | ORAL | Status: DC

## 2016-02-12 MED ORDER — LORAZEPAM 1 MG PO TABS: ORAL_TABLET | ORAL | Status: DC

## 2016-02-12 NOTE — Patient Instructions (Addendum)
Annual physical exam, in 5.5 months  Fasting lipid, cmp and EGFr, hBa1C Vit D, TSH, cbc in 5.5 month   CONGRATS , keep it up  7 ciggs daily for May, 6 for June etc is our plan  Please work on good  health habits so that your health will improve. 1. Commitment to daily physical activity for 30 to 60  minutes, if you are able to do this.  2. Commitment to wise food choices. Aim for half of your  food intake to be vegetable and fruit, one quarter starchy foods, and one quarter protein. Try to eat on a regular schedule  3 meals per day, snacking between meals should be limited to vegetables or fruits or small portions of nuts. 64 ounces of water per day is generally recommended, unless you have specific health conditions, like heart failure or kidney failure where you will need to limit fluid intake.  3. Commitment to sufficient and a  good quality of physical and mental rest daily, generally between 6 to 8 hours per day.  WITH PERSISTANCE AND PERSEVERANCE, THE IMPOSSIBLE , BECOMES THE NORM!

## 2016-02-12 NOTE — Progress Notes (Signed)
Subjective:    Patient ID: Annette [name], female    DOB: 20-Nov-1960, 55 y.o.   MRN: [id number]  HPI The PT is here for follow up and re-evaluation of chronic medical conditions, medication management and review of any available recent lab and radiology data.  Preventive health is updated, specifically  Cancer screening and Immunization.   Questions or concerns regarding consultations or procedures which the PT has had in the interim are  addressed. The PT denies any adverse reactions to current medications since the last visit.  There are no new concerns.  There are no specific complaints      Review of Systems See HPI Denies recent fever or chills. Denies sinus pressure, nasal congestion, ear pain or sore throat. Denies chest congestion, productive cough or wheezing. Denies chest pains, palpitations and leg swelling Denies abdominal pain, nausea, vomiting,diarrhea or constipation.   Denies dysuria, frequency, hesitancy or incontinence. Denies joint pain, swelling and limitation in mobility. Denies headaches, seizures, numbness, or tingling. Denies depression,has increased  anxiety denies  insomnia. Denies skin break down or rash.        Objective:   Physical Exam BP 114/72 mmHg  Pulse 83  Resp 16  Ht 5\' 6"  (1.676 m)  Wt 208 lb 14.4 oz (94.756 kg)  BMI 33.73 kg/m2  SpO2 96%  Patient alert and oriented and in no cardiopulmonary distress.  HEENT: No facial asymmetry, EOMI,   oropharynx pink and moist.  Neck supple no JVD, no mass.  Chest: Clear to auscultation bilaterally.  CVS: S1, S2 no murmurs, no S3.Regular rate.  ABD: Soft non tender.   Ext: No edema  MS: Adequate ROM spine, shoulders, hips and knees.  Skin: Intact, no ulcerations or rash noted.  Psych: Good eye contact, normal affect. Memory intact not anxious or depressed appearing.  CNS: CN 2-12 intact, power,  normal throughout.no focal deficits noted.      Assessment & Plan:  HTN, goal  below 130/80 Controlled, no change in medication DASH diet and commitment to daily physical activity for a minimum of 30 minutes discussed and encouraged, as a part of hypertension management. The importance of attaining a healthy weight is also discussed.  BP/Weight 02/12/2016 12/19/2015 12/10/2015 09/03/2015 06/02/2015 [id number]  Systolic BP [id number] 92 92 [id number] - 123456 [id number]  Diastolic BP 72 53 60 60 - 72 60  Wt. (Lbs) 208.9 - 208.7 215 219 217 221  BMI 33.73 - 33.7 33.67 35.36 35.04 35.69        GERD (gastroesophageal reflux disease) Controlled, no change in medication   Hyperlipidemia LDL goal <100 Hyperlipidemia:Low fat diet discussed and encouraged.   Lipid Panel  Lab Results  Component Value Date   CHOL 196 07/17/2015   HDL 52 11/15/2014   LDLCALC 132 07/17/2015   TRIG 125 11/15/2014   CHOLHDL 5.0 11/15/2014   improved     Insomnia Sleep hygiene reviewed and written information offered also. Prescription sent for  medication needed.   Nicotine dependence Patient counseled for approximately 5 minutes regarding the health risks of ongoing nicotine use, specifically all types of cancer, heart disease, stroke and respiratory failure. The options available for help with cessation ,the behavioral changes to assist the process, and the option to either gradully reduce usage  Or abruptly stop.is also discussed. Pt is also encouraged to set specific goals in number of cigarettes used daily, as well as to set a quit date.  Number of cigarettes/cigars currently smoking daily: 10 to  15   Morbid obesity Improved. Pt applauded on succesful weight loss through lifestyle change, and encouraged to continue same. Weight loss goal set for the next several months.   Metabolic syndrome X The increased risk of cardiovascular disease associated with this diagnosis, and the need to consistently work on lifestyle to change this is discussed. Following  a  heart healthy diet  ,commitment to 30 minutes of exercise at least 5 days per week, as well as control of blood sugar and cholesterol , and achieving a healthy weight are all the areas to be addressed .   GAD (generalized anxiety disorder) Uncontrolled , add buspar   Sleep apnea Controlled, no change in medication

## 2016-02-14 NOTE — Assessment & Plan Note (Signed)
Hyperlipidemia:Low fat diet discussed and encouraged.   Lipid Panel  Lab Results  Component Value Date   CHOL 196 07/17/2015   HDL 52 11/15/2014   LDLCALC 132 07/17/2015   TRIG 125 11/15/2014   CHOLHDL 5.0 11/15/2014   improved

## 2016-02-14 NOTE — Assessment & Plan Note (Signed)
Uncontrolled, add buspar 

## 2016-02-14 NOTE — Assessment & Plan Note (Signed)
Patient counseled for approximately 5 minutes regarding the health risks of ongoing nicotine use, specifically all types of cancer, heart disease, stroke and respiratory failure. The options available for help with cessation ,the behavioral changes to assist the process, and the option to either gradully reduce usage  Or abruptly stop.is also discussed. Pt is also encouraged to set specific goals in number of cigarettes used daily, as well as to set a quit date.  Number of cigarettes/cigars currently smoking daily: 10 to 15  

## 2016-02-14 NOTE — Assessment & Plan Note (Signed)
The increased risk of cardiovascular disease associated with this diagnosis, and the need to consistently work on lifestyle to change this is discussed. Following  a  heart healthy diet ,commitment to 30 minutes of exercise at least 5 days per week, as well as control of blood sugar and cholesterol , and achieving a healthy weight are all the areas to be addressed .  

## 2016-02-14 NOTE — Assessment & Plan Note (Signed)
Controlled, no change in medication  

## 2016-02-14 NOTE — Assessment & Plan Note (Signed)
Improved. Pt applauded on succesful weight loss through lifestyle change, and encouraged to continue same. Weight loss goal set for the next several months.  

## 2016-02-14 NOTE — Assessment & Plan Note (Signed)
Sleep hygiene reviewed and written information offered also. Prescription sent for  medication needed.  

## 2016-02-14 NOTE — Assessment & Plan Note (Signed)
Controlled, no change in medication DASH diet and commitment to daily physical activity for a minimum of 30 minutes discussed and encouraged, as a part of hypertension management. The importance of attaining a healthy weight is also discussed.  BP/Weight 02/12/2016 12/19/2015 12/10/2015 09/03/2015 06/02/2015 [id number]  Systolic BP [id number] 92 92 [id number] - 123456 [id number]  Diastolic BP 72 53 60 60 - 72 60  Wt. (Lbs) 208.9 - 208.7 215 219 217 221  BMI 33.73 - 33.7 33.67 35.36 35.04 35.69

## 2016-02-26 ENCOUNTER — Ambulatory Visit (HOSPITAL_COMMUNITY): Payer: BLUE CROSS/BLUE SHIELD

## 2016-02-27 ENCOUNTER — Ambulatory Visit (HOSPITAL_COMMUNITY)
Admission: RE | Admit: 2016-02-27 | Discharge: 2016-02-27 | Disposition: A | Discharge status: Home / Self Care | Payer: BLUE CROSS/BLUE SHIELD | Source: Ambulatory Visit | Attending: Hematology & Oncology | Admitting: Hematology & Oncology

## 2016-02-27 ENCOUNTER — Other Ambulatory Visit (HOSPITAL_COMMUNITY): Payer: Self-pay | Admitting: Hematology & Oncology

## 2016-02-27 ENCOUNTER — Inpatient Hospital Stay (HOSPITAL_COMMUNITY): Admission: RE | Admit: 2016-02-27 | Payer: BLUE CROSS/BLUE SHIELD | Source: Ambulatory Visit

## 2016-02-27 DIAGNOSIS — Z1231 Encounter for screening mammogram for malignant neoplasm of breast: Secondary | ICD-10-CM | POA: Diagnosis not present

## 2016-03-02 ENCOUNTER — Ambulatory Visit (HOSPITAL_COMMUNITY): Payer: BLUE CROSS/BLUE SHIELD | Admitting: Hematology & Oncology

## 2016-03-04 ENCOUNTER — Ambulatory Visit (HOSPITAL_COMMUNITY): Payer: BLUE CROSS/BLUE SHIELD | Admitting: Hematology & Oncology

## 2016-03-08 NOTE — Progress Notes (Signed)
This encounter was created in error - please disregard.

## 2016-04-01 ENCOUNTER — Other Ambulatory Visit (HOSPITAL_COMMUNITY): Payer: Self-pay | Admitting: Hematology & Oncology

## 2016-04-01 ENCOUNTER — Encounter (HOSPITAL_COMMUNITY): Payer: Self-pay | Admitting: Hematology & Oncology

## 2016-04-01 ENCOUNTER — Encounter (HOSPITAL_COMMUNITY): Payer: BLUE CROSS/BLUE SHIELD | Attending: Hematology & Oncology | Admitting: Hematology & Oncology

## 2016-04-01 VITALS — BP 100/51 | HR 66 | Temp 98.6°F | Resp 16 | Wt 209.0 lb

## 2016-04-01 DIAGNOSIS — Z853 Personal history of malignant neoplasm of breast: Secondary | ICD-10-CM

## 2016-04-01 DIAGNOSIS — Z1231 Encounter for screening mammogram for malignant neoplasm of breast: Secondary | ICD-10-CM

## 2016-04-01 DIAGNOSIS — R1084 Generalized abdominal pain: Secondary | ICD-10-CM

## 2016-04-01 DIAGNOSIS — C50912 Malignant neoplasm of unspecified site of left female breast: Secondary | ICD-10-CM

## 2016-04-01 DIAGNOSIS — M25551 Pain in right hip: Secondary | ICD-10-CM

## 2016-04-01 NOTE — Progress Notes (Signed)
Annette Nakayama, MD  [address], [address] / [place] [address]   DIAGNOSIS: Stage I breast cancer, HER-2/neu overexpressed, originally diagnosed 12/10/2005, status post lumpectomy, sentinel lobe biopsy,  Taxotere and Cytoxan plus Herceptin for 6 cycles followed by additional Herceptin out to 52 weeks with radiation therapy administered after the completion of chemotherapy, followed by 5 years of tamoxifen until November of 2012.   She has also undergone left adrenalectomy with inadvertent damage to the spleen at the time of surgery requiring splenectomy on 02/16/2012 with a history of an enlarging left adrenal tumor in the setting of bilateral adrenal adenomas by imaging.  CURRENT THERAPY: Observation  INTERVAL HISTORY: Annette [name] 55 y.o. female returns for follow up of a history of Stage I Her 2 positive, ER+ breast cancer diagnosed back in 2007.   Annette [name] returns to the [facility] today unaccompanied.  She saw Dr. Laural Golden in March. She isn't sure what's been going on there in her belly, with the pain and bloating, and notes that it's roughly the same as it's always been.  She confirms that her weight is stabilized. She says she was down to 199 but now she's starting to gain back, in spite of not changing her eating habits. She also feels she gets plenty of walking and exercise since she's so busy at work, her weight gain confuses her.  Annette [name] notes that her miserable abdominal symptoms started after her surgery at [facility]. She had a splenectomy in 2013 when she was just supposed to be getting something removed from her adrenal gland. She says "my gut sticks out all the time; every time I eat it hurts." Her gallbladder is gone. That was taken out in 1985.  Overall, she says she gets nauseous quite a bit, but denies any vomiting. She says "if I'm not constipated, then I've got diarrhea." "It comes and goes." She notes that they say she has IBS, and she also  has a hiatal hernia. She notes that sometimes she will go two or three days without a bowel movement, saying she doesn't have good bowel movements.  Last mammogram was on 03/03/2016.  During the physical exam, she notes that her breathing is okay. Her L breast area is tender as usual.  She had her radiation in [place].  She is going to [employer] to spend the Fourth of July with her son.   MEDICAL HISTORY: Past Medical History  Diagnosis Date  . Sleep apnea 2010    Problems with CPAP  . GERD (gastroesophageal reflux disease)   . Menieres disease     Controlled with triamterene   . 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[place])   . IBS (irritable bowel syndrome)   . Depression   . Obesity   . Migraines   . Palpitations   . Bruises easily   . Breast cancer ([place]) 2007    History of  XRT, onTamoxifen  . Diabetes mellitus     prediabetic  . Hypothyroidism     following chemo amnd radiaition for breast cancer, needed replacement short term  . Hypothyroidism (acquired)     replaced x 1 year    has Hypothyroidism; Vitamin D deficiency; Morbid obesity ([place]); IBS; ABNORMAL THYROID FUNCTION TESTS; BREAST CANCER, HX OF; THYROID MASS; Breast cancer ([place]); Meniere's disease; Sleep apnea; Adrenal adenoma; Chest pain, atypical; Prediabetes; Insomnia; HTN, goal below 130/80; Nicotine dependence; GAD (generalized anxiety disorder); Metabolic syndrome X; GERD (gastroesophageal reflux disease); IBS (irritable bowel syndrome); Hyperlipidemia  LDL goal <100; Palpitations; Atypical chest pain; and Breast pain, left on her problem list.     is allergic to penicillins; statins; tape; and zetia.   History of vitamin D deficiency. Prescribed once a week treatment, with re-fills. Voiced issues with sometimes running out of medication during months with 5 weeks instead of 4.  Annette [name] had no medications administered during this visit.   SURGICAL HISTORY: Past Surgical History  Procedure Laterality Date  .  Thyroidectomy, partial  11/05/2010    Benign disease  . Appendectomy  06/12/08  . Cholecystectomy  1985  . Abdominal hysterectomy  1998    Benign, fibroids  . Adrenalectomy  02/16/2012    [facility], splemic trauma, 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  . 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 Breast surgery  2007    Left lumpectomy  . Breast reconstruction      Left reconstructive  . Breast surgery      Mammosite - right side  . Esophagogastroduodenoscopy N/A 12/19/2015    Procedure: ESOPHAGOGASTRODUODENOSCOPY (EGD);  Surgeon: Rogene Houston, MD;  [facility];  Service: Endoscopy;  Laterality: N/A;  11:40  . Esophageal dilation  12/19/2015    Procedure: ESOPHAGEAL DILATION;  Surgeon: Rogene Houston, MD;  Location: [facility];  Service: Endoscopy;;   Dr. Benjamine Mola removed benign tumor on lymph node, five to six years previous Previous colonoscopy Sleep apnea   SOCIAL HISTORY: Social History   Social History  . Marital Status: Married    Spouse Name: N/A  . Number of Children: N/A  . Years of Education: N/A   Occupational History  . Not on file.   Social History Main Topics  . Smoking status: Current Every Day Smoker -- 0.30 packs/day for .5 years    Types: Cigarettes  . Smokeless tobacco: Never Used     Comment: 1/2 pack a day  . Alcohol Use: No     Comment: Encouraged to quit smoking. She has tried the nicotone gum and patches but did not help  . Drug Use: No  . Sexual Activity: Yes    Birth Control/ Protection: Surgical   Other Topics Concern  . Not on file   Social History Narrative   Currently still smokes 1 1/2  To 2 packs of cigarettes a day. Communicated frustration from difficulty getting information during spleen removal with doctors in the past, not from this facility. Receives yearly flu vaccination  FAMILY HISTORY: Family History  Problem Relation Age of Onset  . Diabetes Mother   . Heart disease  Father   . Cancer Paternal Grandmother     Breast    Review of Systems  Constitutional: Negative for fever, chills, weight loss and malaise/fatigue.  HENT: Negative for congestion, hearing loss, nosebleeds, sore throat and tinnitus.   Eyes: Negative for blurred vision, double vision, pain and discharge.  Respiratory: Negative for cough, hemoptysis, sputum production, shortness of breath and wheezing.   Cardiovascular: Negative for chest pain, palpitations, claudication, leg swelling and PND.  Gastrointestinal: Positive for heartburn, abdominal pain, diarrhea and constipation. Negative for nausea, vomiting, blood in stool and melena.       Bloating  Genitourinary: Negative for dysuria, urgency, frequency and hematuria.  Musculoskeletal: Positive for myalgias, back pain and joint pain. Negative for falls.  Skin: Negative for itching and rash.  Neurological: Negative for dizziness, tingling, tremors, sensory change, speech change, focal weakness, seizures, loss of consciousness, weakness  and headaches.  Endo/Heme/Allergies: Does not bruise/bleed easily.  Psychiatric/Behavioral: Negative for depression, suicidal ideas, memory loss and substance abuse. The patient is not nervous/anxious and does not have insomnia.     14 point review of systems was performed and is negative except as detailed under history of present illness and above   PHYSICAL EXAMINATION  ECOG PERFORMANCE STATUS: 1 - Symptomatic but completely ambulatory  Filed Vitals:   04/01/16 0950  BP: 100/51  Pulse: 66  Temp: 98.6 F (37 C)  Resp: 16    Physical Exam  Constitutional: She is oriented to person, place, and time and well-developed, well-nourished, and in no distress.  HENT:  Head: Normocephalic and atraumatic.  Nose: Nose normal.  Mouth/Throat: Oropharynx is clear and moist. No oropharyngeal exudate.  Eyes: Conjunctivae and EOM are normal. Pupils are equal, round, and reactive to light. Right eye exhibits no  discharge. Left eye exhibits no discharge. No scleral icterus.  Neck: Normal range of motion. Neck supple. No tracheal deviation present. No thyromegaly present.  Cardiovascular: Normal rate, regular rhythm and normal heart sounds.  Exam reveals no gallop and no friction rub.   No murmur heard. Pulmonary/Chest: Effort normal and breath sounds normal. She has no wheezes. She has no rales.  Abdominal: Soft. Bowel sounds are normal. She exhibits no distension and no mass. There is no tenderness. There is no rebound and no guarding.  Musculoskeletal: Normal range of motion. She exhibits no edema.  Lymphadenopathy:    She has no cervical adenopathy.  Neurological: She is alert and oriented to person, place, and time. She has normal reflexes. No cranial nerve deficit. Gait normal. Coordination normal.  Skin: Skin is warm and dry. No rash noted.  Psychiatric: Mood, memory, affect and judgment normal.  Nursing note and vitals reviewed. Breast: Tenderness and discomfort of left breast area only. Right breast reduction via lollipop procedure. Right axillary region has no palpable abnormalities. Right breast no palpable abnormalities, no suspicious skin changes. Left breast exhibits obvious radiation changes, scar tissue at the 3-5 o'clock position. Left axillary region is normal. Left breast is smaller than the right breast.  LABORATORY DATA: i have reviewed the data as listed.  CBC    Component Value Date/Time   WBC 9.9 02/01/2014 1245   RBC 4.58 02/01/2014 1245   HGB 15.4* 02/01/2014 1245   HCT 43.8 02/01/2014 1245   PLT 358 02/01/2014 1245   MCV 95.6 02/01/2014 1245   MCH 33.6 02/01/2014 1245   MCHC 35.2 02/01/2014 1245   RDW 13.0 02/01/2014 1245   LYMPHSABS 4.6* 02/01/2014 1245   MONOABS 1.1* 02/01/2014 1245   EOSABS 0.3 02/01/2014 1245   BASOSABS 0.1 02/01/2014 1245   CMP     Component Value Date/Time   NA 142 11/15/2014 1119   K 3.8 11/15/2014 1119   CL 105 11/15/2014 1119   CO2  29 11/15/2014 1119   GLUCOSE 126* 11/15/2014 1119   BUN 13 11/15/2014 1119   CREATININE 0.74 11/15/2014 1119   CREATININE 0.82 02/01/2014 1245   CALCIUM 9.6 11/15/2014 1119   PROT 6.8 11/15/2014 1119   ALBUMIN 3.9 11/15/2014 1119   AST 16 11/15/2014 1119   ALT 14 11/15/2014 1119   ALKPHOS 65 11/15/2014 1119   BILITOT 0.6 11/15/2014 1119   GFRNONAA 80* 02/01/2014 1245   GFRNONAA 81 11/26/2013 1437   GFRAA >90 02/01/2014 1245   GFRAA >89 11/26/2013 1437     ASSESSMENT and THERAPY PLAN:  Stage I breast cancer,  HER-2/neu overexpressed, originally diagnosed 12/10/2005, status post lumpectomy, sentinel lobe biopsy,  Taxotere and Cytoxan plus Herceptin for 6 cycles followed by additional Herceptin out to 52 weeks with radiation therapy administered after the completion of chemotherapy, followed by 5 years of tamoxifen until November of 2012.   Joint pain R hip pain Abdominal discomfort  Her exam today was without obvious recurrence. She will proceed with yearly mammography each May. I have already ordered her mammogram for next year and advised scheduling to bring her in for her appointment after mammography. We will continue with yearly breast exams.   I have suggested she try OTC simethicone for bloating and abdominal distension/gas. She will continue to follow with GI.    All questions were answered. The patient knows to call the clinic with any problems, questions or concerns. We can certainly see the patient much sooner if necessary.  This document serves as a record of services personally performed by Ancil Linsey, MD. It was created on her behalf by Toni Amend, a trained medical scribe. The creation of this record is based on the scribe's personal observations and the provider's statements to them. This document has been checked and approved by the attending provider.  I have reviewed the above documentation for accuracy and completeness, and I agree with the  above.  This note was electronically signed.  Kelby Fam. Penland MD

## 2016-04-01 NOTE — Patient Instructions (Signed)
[place] at [facility] Discharge Instructions  RECOMMENDATIONS MADE BY THE CONSULTANT AND ANY TEST RESULTS WILL BE SENT TO YOUR REFERRING PHYSICIAN.  You were seen by Dr. Whitney Muse today.  Return for follow-up appointment in 1 year. Complete your mammogram prior to follow-up appointment. You may try Gavis-con for bloating.  Call the clinic with any questions or concerns.    Thank you for choosing [place] at [facility] to provide your oncology and hematology care.  To afford each patient quality time with our provider, please arrive at least 15 minutes before your scheduled appointment time.   Beginning January 23rd 2017 lab work for the [facility] will be done in the  Main lab at [facility] on 1st floor. If you have a lab appointment with the [facility] please come in thru the  Main Entrance and check in at the main information desk  You need to re-schedule your appointment should you arrive 10 or more minutes late.  We strive to give you quality time with our providers, and arriving late affects you and other patients whose appointments are after yours.  Also, if you no show three or more times for appointments you may be dismissed from the clinic at the providers discretion.     Again, thank you for choosing [facility].  Our hope is that these requests will decrease the amount of time that you wait before being seen by our physicians.       _____________________________________________________________  Should you have questions after your visit to [facility], please contact our office at [phone] between the hours of 8:30 a.m. and 4:30 p.m.  Voicemails left after 4:30 p.m. will not be returned until the following business day.  For prescription refill requests, have your pharmacy contact our office.         Resources For Cancer Patients and their Caregivers ? American Cancer Society: Can assist  with transportation, wigs, general needs, runs Look Good Feel Better.        ([phone] ? Cancer Care: Provides financial assistance, online support groups, medication/co-pay assistance.  1-800-813-HOPE 734-683-1499) ? [facility] Assists [place] cancer patients and their families through emotional , educational and financial support.  443-761-1773 ? [place] DSS Where to apply for food stamps, Medicaid and utility assistance. 204-793-5631 ? [facility]: Transportation to medical appointments. 4014530338 ? Social Security Administration: May apply for disability if have a Stage IV cancer. 989 356 4267 (614)420-3813 ? [employer], Disability and Transit Services: Assists with nutrition, care and transit needs. [facility] Support Programs: @10RELATIVEDAYS @ > Cancer Support Group  2nd Tuesday of the month 1pm-2pm, Journey Room  > Creative Journey  3rd Tuesday of the month 1130am-1pm, Journey Room  > Look Good Feel Better  1st Wednesday of the month 10am-12 noon, Journey Room (Call [place] to register [phone])

## 2016-04-18 ENCOUNTER — Encounter (HOSPITAL_COMMUNITY): Payer: Self-pay | Admitting: Hematology & Oncology

## 2016-05-12 ENCOUNTER — Other Ambulatory Visit: Payer: Self-pay

## 2016-05-12 DIAGNOSIS — G4730 Sleep apnea, unspecified: Secondary | ICD-10-CM

## 2016-05-25 ENCOUNTER — Other Ambulatory Visit: Payer: Self-pay | Admitting: Family Medicine

## 2016-06-01 ENCOUNTER — Encounter: Payer: Self-pay | Admitting: Family Medicine

## 2016-06-02 NOTE — Telephone Encounter (Signed)
MyChart message sent as well as voicemail left on patient's cell #

## 2016-07-26 ENCOUNTER — Other Ambulatory Visit: Payer: Self-pay | Admitting: Family Medicine

## 2016-08-12 ENCOUNTER — Encounter: Payer: Self-pay | Admitting: Family Medicine

## 2016-08-17 ENCOUNTER — Encounter: Payer: BLUE CROSS/BLUE SHIELD | Admitting: Family Medicine

## 2016-09-02 ENCOUNTER — Other Ambulatory Visit: Payer: Self-pay | Admitting: Family Medicine

## 2016-09-24 ENCOUNTER — Other Ambulatory Visit: Payer: Self-pay | Admitting: Family Medicine

## 2016-10-25 ENCOUNTER — Encounter: Payer: BLUE CROSS/BLUE SHIELD | Admitting: Family Medicine

## 2016-10-27 ENCOUNTER — Other Ambulatory Visit: Payer: Self-pay

## 2016-10-27 MED ORDER — LORAZEPAM 1 MG PO TABS: ORAL_TABLET | ORAL | 0 refills | Status: DC

## 2016-10-28 ENCOUNTER — Telehealth: Payer: Self-pay | Admitting: Family Medicine

## 2016-10-28 NOTE — Telephone Encounter (Signed)
Message documented and sent

## 2016-10-28 NOTE — Telephone Encounter (Signed)
Refill request on lorazepam is being denied as pt has not had labs ordered since May 2017 and she needs them, she also has not been evaluated in office since May 2017 and needs an appt scheduled, pls re order labs etc, medciation management and appropriate refills will be done at the visit

## 2016-10-29 NOTE — Telephone Encounter (Signed)
Called and left message for patient with availability for physical.  Will await return call.

## 2016-11-08 ENCOUNTER — Other Ambulatory Visit: Payer: Self-pay

## 2016-11-08 MED ORDER — OMEPRAZOLE 20 MG PO CPDR: DELAYED_RELEASE_CAPSULE | ORAL | 0 refills | Status: DC

## 2016-11-24 ENCOUNTER — Other Ambulatory Visit: Payer: Self-pay | Admitting: Family Medicine

## 2016-11-30 ENCOUNTER — Encounter: Payer: Self-pay | Admitting: Family Medicine

## 2016-11-30 ENCOUNTER — Other Ambulatory Visit: Payer: Self-pay

## 2016-11-30 MED ORDER — TEMAZEPAM 15 MG PO CAPS: 15.0000 mg | ORAL_CAPSULE | Freq: Every evening | ORAL | 0 refills | Status: DC | PRN

## 2016-12-08 ENCOUNTER — Encounter: Payer: BLUE CROSS/BLUE SHIELD | Admitting: Family Medicine

## 2016-12-17 ENCOUNTER — Other Ambulatory Visit: Payer: Self-pay | Admitting: Family Medicine

## 2016-12-20 ENCOUNTER — Other Ambulatory Visit: Payer: Self-pay

## 2016-12-20 MED ORDER — LORAZEPAM 1 MG PO TABS: ORAL_TABLET | ORAL | 2 refills | Status: DC

## 2016-12-21 ENCOUNTER — Other Ambulatory Visit: Payer: Self-pay

## 2016-12-21 MED ORDER — LORAZEPAM 1 MG PO TABS: ORAL_TABLET | ORAL | 0 refills | Status: DC

## 2016-12-28 ENCOUNTER — Ambulatory Visit (INDEPENDENT_AMBULATORY_CARE_PROVIDER_SITE_OTHER): Payer: BLUE CROSS/BLUE SHIELD | Admitting: Family Medicine

## 2016-12-28 VITALS — BP 118/74 | Resp 16 | Ht 66.0 in | Wt 215.4 lb

## 2016-12-28 DIAGNOSIS — R7303 Prediabetes: Secondary | ICD-10-CM | POA: Diagnosis not present

## 2016-12-28 DIAGNOSIS — F411 Generalized anxiety disorder: Secondary | ICD-10-CM | POA: Diagnosis not present

## 2016-12-28 DIAGNOSIS — F17218 Nicotine dependence, cigarettes, with other nicotine-induced disorders: Secondary | ICD-10-CM

## 2016-12-28 DIAGNOSIS — E039 Hypothyroidism, unspecified: Secondary | ICD-10-CM

## 2016-12-28 DIAGNOSIS — E785 Hyperlipidemia, unspecified: Secondary | ICD-10-CM

## 2016-12-28 DIAGNOSIS — M7552 Bursitis of left shoulder: Secondary | ICD-10-CM | POA: Diagnosis not present

## 2016-12-28 DIAGNOSIS — I10 Essential (primary) hypertension: Secondary | ICD-10-CM | POA: Diagnosis not present

## 2016-12-28 DIAGNOSIS — F5101 Primary insomnia: Secondary | ICD-10-CM

## 2016-12-28 MED ORDER — BETAMETHASONE DIPROPIONATE 0.05 % EX CREA: TOPICAL_CREAM | Freq: Two times a day (BID) | CUTANEOUS | 0 refills | Status: DC

## 2016-12-28 MED ORDER — PREDNISONE 5 MG (21) PO TBPK: 5.0000 mg | ORAL_TABLET | ORAL | 0 refills | Status: DC

## 2016-12-28 MED ORDER — TEMAZEPAM 15 MG PO CAPS: 15.0000 mg | ORAL_CAPSULE | Freq: Every evening | ORAL | 1 refills | Status: DC | PRN

## 2016-12-28 NOTE — Patient Instructions (Addendum)
Physical exam end September, call if you need me before]  Labs are good  Cholesterol improved, blood sugar not as good as before   Fasting lipid, cmp and EGFR , HBA1C , TSH 1 week before  You are to take naproxen one twice dialy for next 1 week and a prednisone 5 mg dose pack is prescribed for next 6 days for left shoulder bursitis, if not better you will need orthopedics  Target of 4 cigarettes / day or less by next visit  Keep  Active   Steps to Quit Smoking Smoking tobacco can be bad for your health. It can also affect almost every organ in your body. Smoking puts you and people around you at risk for many serious long-lasting (chronic) diseases. Quitting smoking is hard, but it is one of the best things that you can do for your health. It is never too late to quit. What are the benefits of quitting smoking? When you quit smoking, you lower your risk for getting serious diseases and conditions. They can include:  Lung cancer or lung disease.  Heart disease.  Stroke.  Heart attack.  Not being able to have children (infertility).  Weak bones (osteoporosis) and broken bones (fractures). If you have coughing, wheezing, and shortness of breath, those symptoms may get better when you quit. You may also get sick less often. If you are pregnant, quitting smoking can help to lower your chances of having a baby of low birth weight. What can I do to help me quit smoking? Talk with your doctor about what can help you quit smoking. Some things you can do (strategies) include:  Quitting smoking totally, instead of slowly cutting back how much you smoke over a period of time.  Going to in-person counseling. You are more likely to quit if you go to many counseling sessions.  Using resources and support systems, such as:  Online chats with a counselor.  Phone quitlines.  Printed self-help materials.  Support groups or group counseling.  Text messaging programs.  Mobile phone  apps or applications.  Taking medicines. Some of these medicines may have nicotine in them. If you are pregnant or breastfeeding, do not take any medicines to quit smoking unless your doctor says it is okay. Talk with your doctor about counseling or other things that can help you. Talk with your doctor about using more than one strategy at the same time, such as taking medicines while you are also going to in-person counseling. This can help make quitting easier. What things can I do to make it easier to quit? Quitting smoking might feel very hard at first, but there is a lot that you can do to make it easier. Take these steps:  Talk to your family and friends. Ask them to support and encourage you.  Call phone quitlines, reach out to support groups, or work with a counselor.  Ask people who smoke to not smoke around you.  Avoid places that make you want (trigger) to smoke, such as:  Bars.  Parties.  Smoke-break areas at work.  Spend time with people who do not smoke.  Lower the stress in your life. Stress can make you want to smoke. Try these things to help your stress:  Getting regular exercise.  Deep-breathing exercises.  Yoga.  Meditating.  Doing a body scan. To do this, close your eyes, focus on one area of your body at a time from head to toe, and notice which parts of your body   are tense. Try to relax the muscles in those areas.  Download or buy apps on your mobile phone or tablet that can help you stick to your quit plan. There are many free apps, such as QuitGuide from the [employer] Office manager for Disease Control and Prevention). You can find more support from smokefree.gov and other websites. This information is not intended to replace advice given to you by your health care provider. Make sure you discuss any questions you have with your health care provider. Document Released: 07/17/2009 Document Revised: 05/18/2016 Document Reviewed: 02/04/2015 Elsevier Interactive Patient  Education  2017 [employer].

## 2017-01-03 ENCOUNTER — Encounter: Payer: Self-pay | Admitting: Family Medicine

## 2017-01-03 NOTE — Assessment & Plan Note (Signed)
Acute onset , has j had in the past, short course of oral anti inflammatories, will call for ortho referral if needed

## 2017-01-03 NOTE — Assessment & Plan Note (Signed)
Deteriorated Patient educated about the importance of limiting  Carbohydrate intake , the need to commit to daily physical activity for a minimum of 30 minutes , and to commit weight loss. The fact that changes in all these areas will reduce or eliminate all together the development of diabetes is stressed.   Diabetic Labs Latest Ref Rng & Units 07/17/2015 11/15/2014 07/12/2014 03/13/2014 02/01/2014  HbA1c 4.0 - 6.0 % 6.4(A) 6.0(H) 5.9(H) 5.9(H) -  Microalbumin 0.00 - 1.89 mg/dL - - - - -  Micro/Creat Ratio 0.0 - 30.0 mg/g - - - - -  Chol 0 - 200 mg/dL 196 261(H) 255(H) 238(H) -  HDL >39 mg/dL - 52 42 46 -  Calc LDL mg/dL 132 184(H) 184(H) 160(H) -  Triglycerides <150 mg/dL - 125 146 162(H) -  Creatinine 0.50 - 1.10 mg/dL - 0.74 0.83 0.81 0.82   BP/Weight 12/28/2016 04/01/2016 02/12/2016 12/19/2015 12/10/2015 09/03/2015 0/13/1438  Systolic BP 887 579 728 92 92 206 -  Diastolic BP 74 51 72 53 60 60 -  Wt. (Lbs) 215.4 209 208.9 - 208.7 215 219  BMI 34.77 33.75 33.73 - 33.7 33.67 35.36   Foot/eye exam completion dates 07/15/2014  Foot Form Completion Done

## 2017-01-03 NOTE — Assessment & Plan Note (Signed)
Controlled, no change in medication DASH diet and commitment to daily physical activity for a minimum of 30 minutes discussed and encouraged, as a part of hypertension management. The importance of attaining a healthy weight is also discussed.  BP/Weight 12/28/2016 04/01/2016 02/12/2016 12/19/2015 12/10/2015 09/03/2015 2/00/3794  Systolic BP 446 190 122 92 92 241 -  Diastolic BP 74 51 72 53 60 60 -  Wt. (Lbs) 215.4 209 208.9 - 208.7 215 219  BMI 34.77 33.75 33.73 - 33.7 33.67 35.36

## 2017-01-03 NOTE — Assessment & Plan Note (Signed)
Controlled, no change in medication Sleep hygiene reviewed and written information offered also. Prescription sent for  medication needed.  

## 2017-01-03 NOTE — Progress Notes (Signed)
Annette [name]     MRN: [id number]      DOB: May 31, 1961   HPI Annette [name] is here for follow up and re-evaluation of chronic medical conditions, medication management and review of any available recent lab and radiology data.  Preventive health is updated, specifically  Cancer screening and Immunization.    The PT denies any adverse reactions to current medications since the last visit.  4 day h/o acute pain and swelling of left shoulder, no direct trauma to joint, has h/o bursitis in the pastThere are no specific complaints   ROS Denies recent fever or chills. Denies sinus pressure, nasal congestion, ear pain or sore throat. Denies chest congestion, productive cough or wheezing. Denies chest pains, palpitations and leg swelling Denies abdominal pain, nausea, vomiting,diarrhea or constipation.   Denies dysuria, frequency, hesitancy or incontinence. . Denies headaches, seizures, numbness, or tingling. Denies depression, anxiety or insomnia. Denies skin break down or rash.   PE  BP 118/74   Resp 16   Ht 5\' 6"  (1.676 m)   Wt 215 lb 6.4 oz (97.7 kg)   SpO2 94%   BMI 34.77 kg/m   Patient alert and oriented and in no cardiopulmonary distress.  HEENT: No facial asymmetry, EOMI,   oropharynx pink and moist.  Neck supple no JVD, no mass.  Chest: Clear to auscultation bilaterally.  CVS: S1, S2 no murmurs, no S3.Regular rate.  ABD: Soft non tender.   Ext: No edema  MS: Adequate ROM spine,  hips and knees.decreased ROM left shoulder with anterior swelling and tenderness  Skin: Intact, no ulcerations or rash noted.  Psych: Good eye contact, normal affect. Memory intact not anxious or depressed appearing.  CNS: CN 2-12 intact, power,  normal throughout.no focal deficits noted.   Assessment & Plan  Bursitis of deltoid, left Acute onset , has j had in the past, short course of oral anti inflammatories, will call for ortho referral if needed  HTN, goal below  130/80 Controlled, no change in medication DASH diet and commitment to daily physical activity for a minimum of 30 minutes discussed and encouraged, as a part of hypertension management. The importance of attaining a healthy weight is also discussed.  BP/Weight 12/28/2016 04/01/2016 02/12/2016 12/19/2015 12/10/2015 09/03/2015 6/96/7893  Systolic BP 810 175 102 92 92 585 -  Diastolic BP 74 51 72 53 60 60 -  Wt. (Lbs) 215.4 209 208.9 - 208.7 215 219  BMI 34.77 33.75 33.73 - 33.7 33.67 35.36       GAD (generalized anxiety disorder) Improved and controlled despite recent increase in personal stress , spouse out on disability, continue current medication  Nicotine dependence Reducing nicotine usage , curerent between 7 to 10 per day, trying to quit Patient counseled for approximately 5 minutes regarding the health risks of ongoing nicotine use, specifically all types of cancer, heart disease, stroke and respiratory failure. The options available for help with cessation ,the behavioral changes to assist the process, and the option to either gradully reduce usage  Or abruptly stop.is also discussed. Pt is also encouraged to set specific goals in number of cigarettes used daily, as well as to set a quit date.     Hyperlipidemia LDL goal <100 Hyperlipidemia:Low fat diet discussed and encouraged. Improved as compared to rpevious, lab to be scanned in  Lipid Panel  Lab Results  Component Value Date   CHOL 196 07/17/2015   HDL 52 11/15/2014   LDLCALC 132 07/17/2015   TRIG 125  11/15/2014   CHOLHDL 5.0 11/15/2014       Prediabetes Deteriorated Patient educated about the importance of limiting  Carbohydrate intake , the need to commit to daily physical activity for a minimum of 30 minutes , and to commit weight loss. The fact that changes in all these areas will reduce or eliminate all together the development of diabetes is stressed.   Diabetic Labs Latest Ref Rng & Units 07/17/2015  11/15/2014 07/12/2014 03/13/2014 02/01/2014  HbA1c 4.0 - 6.0 % 6.4(A) 6.0(H) 5.9(H) 5.9(H) -  Microalbumin 0.00 - 1.89 mg/dL - - - - -  Micro/Creat Ratio 0.0 - 30.0 mg/g - - - - -  Chol 0 - 200 mg/dL 196 261(H) 255(H) 238(H) -  HDL >39 mg/dL - 52 42 46 -  Calc LDL mg/dL 132 184(H) 184(H) 160(H) -  Triglycerides <150 mg/dL - 125 146 162(H) -  Creatinine 0.50 - 1.10 mg/dL - 0.74 0.83 0.81 0.82   BP/Weight 12/28/2016 04/01/2016 02/12/2016 12/19/2015 12/10/2015 09/03/2015 0/06/3817  Systolic BP 299 371 696 92 92 789 -  Diastolic BP 74 51 72 53 60 60 -  Wt. (Lbs) 215.4 209 208.9 - 208.7 215 219  BMI 34.77 33.75 33.73 - 33.7 33.67 35.36   Foot/eye exam completion dates 07/15/2014  Foot Form Completion Done      Morbid obesity Deteriorated. Patient re-educated about  the importance of commitment to a  minimum of 150 minutes of exercise per week.  The importance of healthy food choices with portion control discussed. Encouraged to start a food diary, count calories and to consider  joining a support group. Sample diet sheets offered. Goals set by the patient for the next several months.   Weight /BMI 12/28/2016 04/01/2016 02/12/2016  WEIGHT 215 lb 6.4 oz 209 lb 208 lb 14.4 oz  HEIGHT 5\' 6"  - 5\' 6"   BMI 34.77 kg/m2 33.75 kg/m2 33.73 kg/m2      Insomnia Controlled, no change in medication Sleep hygiene reviewed and written information offered also. Prescription sent for  medication needed.

## 2017-01-03 NOTE — Assessment & Plan Note (Signed)
Reducing nicotine usage , curerent between 7 to 10 per day, trying to quit Patient counseled for approximately 5 minutes regarding the health risks of ongoing nicotine use, specifically all types of cancer, heart disease, stroke and respiratory failure. The options available for help with cessation ,the behavioral changes to assist the process, and the option to either gradully reduce usage  Or abruptly stop.is also discussed. Pt is also encouraged to set specific goals in number of cigarettes used daily, as well as to set a quit date.

## 2017-01-03 NOTE — Assessment & Plan Note (Signed)
Hyperlipidemia:Low fat diet discussed and encouraged. Improved as compared to rpevious, lab to be scanned in  Lipid Panel  Lab Results  Component Value Date   CHOL 196 07/17/2015   HDL 52 11/15/2014   LDLCALC 132 07/17/2015   TRIG 125 11/15/2014   CHOLHDL 5.0 11/15/2014

## 2017-01-03 NOTE — Assessment & Plan Note (Signed)
Improved and controlled despite recent increase in personal stress , spouse out on disability, continue current medication

## 2017-01-03 NOTE — Assessment & Plan Note (Signed)
Deteriorated. Patient re-educated about  the importance of commitment to a  minimum of 150 minutes of exercise per week.  The importance of healthy food choices with portion control discussed. Encouraged to start a food diary, count calories and to consider  joining a support group. Sample diet sheets offered. Goals set by the patient for the next several months.   Weight /BMI 12/28/2016 04/01/2016 02/12/2016  WEIGHT 215 lb 6.4 oz 209 lb 208 lb 14.4 oz  HEIGHT 5\' 6"  - 5\' 6"   BMI 34.77 kg/m2 33.75 kg/m2 33.73 kg/m2

## 2017-01-19 ENCOUNTER — Other Ambulatory Visit: Payer: Self-pay

## 2017-01-19 ENCOUNTER — Telehealth: Payer: Self-pay

## 2017-01-19 ENCOUNTER — Encounter: Payer: Self-pay | Admitting: Family Medicine

## 2017-01-19 MED ORDER — ESOMEPRAZOLE MAGNESIUM 20 MG PO CPDR: 20.0000 mg | DELAYED_RELEASE_CAPSULE | Freq: Two times a day (BID) | ORAL | 3 refills | Status: DC

## 2017-01-19 NOTE — Telephone Encounter (Signed)
Approved.  Same dose.  May send to pharmacy

## 2017-01-19 NOTE — Telephone Encounter (Signed)
Wants omeprazole changed to nexium now that she has insurance and can afford it bc omeprazole not working as well anymore.

## 2017-01-19 NOTE — Telephone Encounter (Signed)
Done

## 2017-01-20 ENCOUNTER — Other Ambulatory Visit: Payer: Self-pay | Admitting: Family Medicine

## 2017-02-25 ENCOUNTER — Ambulatory Visit (INDEPENDENT_AMBULATORY_CARE_PROVIDER_SITE_OTHER): Payer: BLUE CROSS/BLUE SHIELD

## 2017-02-25 ENCOUNTER — Encounter: Payer: Self-pay | Admitting: Podiatry

## 2017-02-25 ENCOUNTER — Ambulatory Visit (INDEPENDENT_AMBULATORY_CARE_PROVIDER_SITE_OTHER): Payer: BLUE CROSS/BLUE SHIELD | Admitting: Podiatry

## 2017-02-25 VITALS — BP 116/72 | HR 57 | Resp 16 | Ht 66.0 in | Wt 200.0 lb

## 2017-02-25 DIAGNOSIS — M79671 Pain in right foot: Secondary | ICD-10-CM | POA: Diagnosis not present

## 2017-02-25 DIAGNOSIS — M722 Plantar fascial fibromatosis: Secondary | ICD-10-CM | POA: Diagnosis not present

## 2017-02-25 DIAGNOSIS — M79672 Pain in left foot: Secondary | ICD-10-CM

## 2017-02-25 NOTE — Patient Instructions (Signed)
Pre-Operative Instructions  Congratulations, you have decided to take an important step to improving your quality of life.  You can be assured that the doctors of [facility] will be with you every step of the way.  1. Plan to be at the surgery center/hospital at least 1 (one) hour prior to your scheduled time unless otherwise directed by the surgical center/hospital staff.  You must have a responsible adult accompany you, remain during the surgery and drive you home.  Make sure you have directions to the surgical center/hospital and know how to get there on time. 2. For hospital based surgery you will need to obtain a history and physical form from your family physician within 1 month prior to the date of surgery- we will give you a form for you primary physician.  3. We make every effort to accommodate the date you request for surgery.  There are however, times where surgery dates or times have to be moved.  We will contact you as soon as possible if a change in schedule is required.   4. No Aspirin/Ibuprofen for one week before surgery.  If you are on aspirin, any non-steroidal anti-inflammatory medications (Mobic, Aleve, Ibuprofen) you should stop taking it 7 days prior to your surgery.  You make take Tylenol  For pain prior to surgery.  5. Medications- If you are taking daily heart and blood pressure medications, seizure, reflux, allergy, asthma, anxiety, pain or diabetes medications, make sure the surgery center/hospital is aware before the day of surgery so they may notify you which medications to take or avoid the day of surgery. 6. No food or drink after midnight the night before surgery unless directed otherwise by surgical center/hospital staff. 7. No alcoholic beverages 24 hours prior to surgery.  No smoking 24 hours prior to or 24 hours after surgery. 8. Wear loose pants or shorts- loose enough to fit over bandages, boots, and casts. 9. No slip on shoes, sneakers are best. 10. Bring  your boot with you to the surgery center/hospital.  Also bring crutches or a walker if your physician has prescribed it for you.  If you do not have this equipment, it will be provided for you after surgery. 11. If you have not been contracted by the surgery center/hospital by the day before your surgery, call to confirm the date and time of your surgery. 12. Leave-time from work may vary depending on the type of surgery you have.  Appropriate arrangements should be made prior to surgery with your employer. 13. Prescriptions will be provided immediately following surgery by your doctor.  Have these filled as soon as possible after surgery and take the medication as directed. 14. Remove nail polish on the operative foot. 15. Wash the night before surgery.  The night before surgery wash the foot and leg well with the antibacterial soap provided and water paying special attention to beneath the toenails and in between the toes.  Rinse thoroughly with water and dry well with a towel.  Perform this wash unless told not to do so by your physician.  Enclosed: 1 Ice pack (please put in freezer the night before surgery)   1 Hibiclens skin cleaner   Pre-op Instructions  If you have any questions regarding the instructions, do not hesitate to call our office.  [place]: [address] , [place] [address] [phone]  [place]: [address] [place], [place] [address] [phone]  [place]: [address]  [place], [place] [address] [phone]   Dr.   Aviona Martenson DPM, Dr. Matthew Wagoner DPM, Dr. M. Todd Hyatt DPM, Dr. Titorya Stover DPM 

## 2017-02-25 NOTE — Progress Notes (Signed)
   Subjective:    Patient ID: Annette [name], female    DOB: [birth year], 56 y.o.   MRN: [id number]  HPI Chief Complaint  Patient presents with  . Foot Pain    Bilateral; plantar-midfoot (towards medial side); pt stated, "Has had knots for the past 6 months to 1 year"      Review of Systems  Constitutional: Positive for unexpected weight change.  HENT: Positive for tinnitus.   Eyes: Positive for itching.  Respiratory: Positive for shortness of breath and wheezing.   Cardiovascular: Positive for palpitations and leg swelling.  Gastrointestinal: Positive for abdominal distention, abdominal pain, constipation, diarrhea and nausea.  Musculoskeletal: Positive for arthralgias, back pain, gait problem and myalgias.  Neurological: Positive for headaches.  Hematological: Bruises/bleeds easily.  All other systems reviewed and are negative.      Objective:   Physical Exam        Assessment & Plan:

## 2017-02-26 NOTE — Progress Notes (Signed)
Subjective:    Patient ID: Annette [name], female   DOB: 56 y.o.   MRN: [id number]   HPI patient presents after not seeing me for a number of years. She has severe discomfort on the plantar aspect of the right arch and moderate on the left and is developed nodules over the last year year and a half. They're increasingly tender especially on the right and make it difficult to bear weight    Review of Systems  All other systems reviewed and are negative.       Objective:  Physical Exam  Cardiovascular: Intact distal pulses.   Musculoskeletal: Normal range of motion.  Neurological: She is alert.  Skin: Skin is warm.  Nursing note and vitals reviewed.  neurovascular status intact muscle strength adequate with nodule measuring approximately 1 cm x 1 cm plantar aspect right that's formed and painful and left shows smaller nodules that are more spread out in their orientation. 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 She wants it removed and is not interested in trying orthotics or injection treatment even though we will inject the left will be due to the right surgery. I allowed her to read consent form going over alternative treatments complications and the fact these can regrow and that there can be scarring plantarly due to where we have oriented incision. Patient wants surgery signed consent form is given all preoperative instructions and is dispensed air fracture walker to get used to and understands she'll be nonweightbearing approximate 2 weeks and the told recovery will take 6 months to one year

## 2017-03-02 ENCOUNTER — Other Ambulatory Visit: Payer: Self-pay | Admitting: Family Medicine

## 2017-03-03 ENCOUNTER — Other Ambulatory Visit: Payer: Self-pay

## 2017-03-15 ENCOUNTER — Encounter: Payer: Self-pay | Admitting: Podiatry

## 2017-03-15 DIAGNOSIS — D492 Neoplasm of unspecified behavior of bone, soft tissue, and skin: Secondary | ICD-10-CM | POA: Diagnosis not present

## 2017-03-16 ENCOUNTER — Telehealth: Payer: Self-pay | Admitting: Podiatry

## 2017-03-16 NOTE — Telephone Encounter (Addendum)
Pt states she doesn't know how long she is to be off of her foot post-op on the instruction sheet. I told pt she can lightly and quickly bear weight on the surgery foot, and use a crutch or walker for balance. I told pt not to weight bear or dangle more than 15 minutes per hour, and to continue to ice q hour. Pt describes the pain as a burning when elevated. I told pt to remove the surgical boot, open ended sock, ace wrap and to elevate the foot for 15 minutes, if pain worsened to dangle the foot this being the only time it was okay to dangle the foot, after 15 minutes place foot level with hip and rewrap ace looser beginning at the toes, reapply the sock and the surgical boot. Pt states the Hydrocodone  is not helping the pain. I told pt to take as directed and take 2-3 ibuprofen tid, and try removing the surgical boot and ace as instructed call back with concerns. I told pt that if Dr. Paulla Dolly changed the pain medication she would have to send the hydrocodone to the office with the person picking up the change of pain medication. Dr. Paulla Dolly states if pt needs to change hydrocodone, may change to [facility] 10/325mg  #30 one every 4-6 hours prn foot pain. I called to check status of pt and she states the foot wasn't hurting and so she hasn't done anything, will take the boot off later.

## 2017-03-16 NOTE — Telephone Encounter (Signed)
Pt needs help understanding discharge instructions and she said the pain med is not working.

## 2017-03-21 ENCOUNTER — Encounter: Payer: Self-pay | Admitting: Podiatry

## 2017-03-21 ENCOUNTER — Telehealth: Payer: Self-pay | Admitting: *Deleted

## 2017-03-21 ENCOUNTER — Ambulatory Visit (INDEPENDENT_AMBULATORY_CARE_PROVIDER_SITE_OTHER): Payer: BLUE CROSS/BLUE SHIELD | Admitting: Podiatry

## 2017-03-21 ENCOUNTER — Ambulatory Visit (INDEPENDENT_AMBULATORY_CARE_PROVIDER_SITE_OTHER): Payer: BLUE CROSS/BLUE SHIELD

## 2017-03-21 ENCOUNTER — Other Ambulatory Visit: Payer: Self-pay | Admitting: Family Medicine

## 2017-03-21 ENCOUNTER — Other Ambulatory Visit: Payer: Self-pay | Admitting: Podiatry

## 2017-03-21 ENCOUNTER — Encounter (HOSPITAL_COMMUNITY): Payer: BLUE CROSS/BLUE SHIELD

## 2017-03-21 VITALS — BP 103/68 | HR 83 | Temp 98.9°F | Resp 16

## 2017-03-21 DIAGNOSIS — M79661 Pain in right lower leg: Secondary | ICD-10-CM

## 2017-03-21 DIAGNOSIS — M722 Plantar fascial fibromatosis: Secondary | ICD-10-CM | POA: Diagnosis not present

## 2017-03-21 MED ORDER — MEPERIDINE HCL 50 MG PO TABS: 50.0000 mg | ORAL_TABLET | ORAL | 0 refills | Status: DC | PRN

## 2017-03-21 NOTE — Telephone Encounter (Addendum)
Rip Harbour - [facility] states has a 3:00pm appt today.Left message informing pt, I had an appt for venous doppler this afternoon but would continue to look for an appt tomorrow since she was from out of town. Felicia - [facility] states she does not have any availability for venous doppler. I spoke with Jocelyn Lamer [facility] and she has an appt 03/22/2017 at 8:30am. I left message informing pt of the appt with [facility] and to call with confirmation. Pt left message requesting [facility] address. I left message informing pt the address was [address], [place], [place], [place].

## 2017-03-22 ENCOUNTER — Ambulatory Visit
Admission: RE | Admit: 2017-03-22 | Discharge: 2017-03-22 | Disposition: A | Discharge status: Home / Self Care | Payer: BLUE CROSS/BLUE SHIELD | Source: Ambulatory Visit | Attending: Podiatry | Admitting: Podiatry

## 2017-03-22 ENCOUNTER — Telehealth: Payer: Self-pay | Admitting: *Deleted

## 2017-03-22 ENCOUNTER — Other Ambulatory Visit: Payer: Self-pay | Admitting: Family Medicine

## 2017-03-22 DIAGNOSIS — M79661 Pain in right lower leg: Secondary | ICD-10-CM

## 2017-03-22 MED ORDER — TEMAZEPAM 15 MG PO CAPS: 15.0000 mg | ORAL_CAPSULE | Freq: Every evening | ORAL | 0 refills | Status: DC | PRN

## 2017-03-22 MED ORDER — VITAMIN D (ERGOCALCIFEROL) 1.25 MG (50000 UNIT) PO CAPS
50000.0000 [IU] | ORAL_CAPSULE | ORAL | 0 refills | Status: DC
Start: 2017-03-22 — End: 2017-05-31

## 2017-03-22 NOTE — Telephone Encounter (Signed)
Patient is in [place], patient is requesting her medications temazepam 15mg  and vitamin D to be refilled to the [facility]. Please advise [phone]

## 2017-03-23 NOTE — Progress Notes (Signed)
Subjective:    Patient ID: Annette [name], female   DOB: 56 y.o.   MRN: [id number]   HPI patient presents stating I'm doing well but still having pain    ROS      Objective:  Physical Exam neurovascular status intact negative Homans sign was noted with patient's right Lanter incision site healing well with wound edges well coapted good positional component of the incision with minimal edema     Assessment:   Doing well overall post surgery      Plan:    Reapplied sterile dressing advised a continued boot usage and nonweightbearing as best as possible and reappoint 2 weeks for suture removal or earlier if needed. Did placed patient back on pain medicine to try to control her continued discomfort but it should continue to get better

## 2017-03-23 NOTE — Telephone Encounter (Signed)
rxs done and faxed to pharmacy.

## 2017-03-25 NOTE — Progress Notes (Signed)
DOS 06.12.2018 Excision of soft tissue mass suspect fibroma right arch.

## 2017-03-28 ENCOUNTER — Ambulatory Visit (HOSPITAL_COMMUNITY): Payer: BLUE CROSS/BLUE SHIELD

## 2017-04-01 ENCOUNTER — Ambulatory Visit (HOSPITAL_COMMUNITY): Payer: BLUE CROSS/BLUE SHIELD

## 2017-04-04 ENCOUNTER — Encounter: Payer: Self-pay | Admitting: Podiatry

## 2017-04-04 ENCOUNTER — Ambulatory Visit (INDEPENDENT_AMBULATORY_CARE_PROVIDER_SITE_OTHER): Payer: BLUE CROSS/BLUE SHIELD | Admitting: Podiatry

## 2017-04-04 DIAGNOSIS — M722 Plantar fascial fibromatosis: Secondary | ICD-10-CM

## 2017-04-08 ENCOUNTER — Other Ambulatory Visit: Payer: Self-pay

## 2017-04-08 ENCOUNTER — Telehealth: Payer: Self-pay | Admitting: Family Medicine

## 2017-04-08 ENCOUNTER — Other Ambulatory Visit: Payer: Self-pay | Admitting: Family Medicine

## 2017-04-08 MED ORDER — ESOMEPRAZOLE MAGNESIUM 40 MG PO CPDR: 40.0000 mg | DELAYED_RELEASE_CAPSULE | Freq: Every day | ORAL | 3 refills | Status: DC

## 2017-04-08 MED ORDER — ESOMEPRAZOLE MAGNESIUM 20 MG PO CPDR: 20.0000 mg | DELAYED_RELEASE_CAPSULE | Freq: Two times a day (BID) | ORAL | 3 refills | Status: DC

## 2017-04-08 NOTE — Telephone Encounter (Signed)
[facility] will not pay for Nexium 20mg  bid  But willl pay for 40mg    Pt headed for [facility] and has to have this today.  She states she is out.    [place], [place]    cb [phone]

## 2017-04-08 NOTE — Telephone Encounter (Signed)
Rx adjusted and sent to pharm

## 2017-04-20 NOTE — Progress Notes (Signed)
Patient presents s/p DOS 06.12.2018 Excision of soft tissue mass suspect fibroma right arch. She states that she is not in any pain, and has not had a lot of complications with this surgery.   Noted well healing surgical foot, no erythema, swelling or tenderness on palpation. Sutures removed, wound edges remained aligned approximated and with no gapping. No drainage noted. Steri-strips applied, she is to follow up in 2 weeks to see Dr Paulla Dolly for post operative evaluation

## 2017-04-25 ENCOUNTER — Other Ambulatory Visit: Payer: Self-pay | Admitting: Oncology

## 2017-04-25 DIAGNOSIS — Z1231 Encounter for screening mammogram for malignant neoplasm of breast: Secondary | ICD-10-CM

## 2017-04-29 ENCOUNTER — Ambulatory Visit (INDEPENDENT_AMBULATORY_CARE_PROVIDER_SITE_OTHER): Payer: Self-pay | Admitting: Podiatry

## 2017-04-29 ENCOUNTER — Encounter: Payer: Self-pay | Admitting: Podiatry

## 2017-04-29 ENCOUNTER — Ambulatory Visit (HOSPITAL_COMMUNITY)
Admission: RE | Admit: 2017-04-29 | Discharge: 2017-04-29 | Disposition: A | Discharge status: Home / Self Care | Payer: BLUE CROSS/BLUE SHIELD | Source: Ambulatory Visit | Attending: Oncology | Admitting: Oncology

## 2017-04-29 ENCOUNTER — Other Ambulatory Visit: Payer: Self-pay | Admitting: Family Medicine

## 2017-04-29 DIAGNOSIS — M722 Plantar fascial fibromatosis: Secondary | ICD-10-CM

## 2017-04-29 DIAGNOSIS — Z1231 Encounter for screening mammogram for malignant neoplasm of breast: Secondary | ICD-10-CM | POA: Diagnosis not present

## 2017-05-01 NOTE — Progress Notes (Signed)
Subjective:    Patient ID: Annette [name], female   DOB: 56 y.o.   MRN: [id number]   HPI patient states doing fine but having some thickness of the incision site plantar right with some mild swelling    ROS      Objective:  Physical Exam neurovascular status intact with patient noted to have irritation of the incision localized with no drainage noted or any indications of other pathology     Assessment:    Normal healing process with moderate plantar edema secondary to plantar fibroma excision right     Plan:    We'll start compounding creams to reduce any scar tissue formation and we'll utilize physical therapy to try to reduce any swelling. Reappoint for [place] to recheck for regular visit

## 2017-05-02 ENCOUNTER — Telehealth: Payer: Self-pay | Admitting: Family Medicine

## 2017-05-02 ENCOUNTER — Other Ambulatory Visit: Payer: Self-pay

## 2017-05-02 NOTE — Telephone Encounter (Signed)
No clonazepam on med list. Refilled temezepam which was on the refill request

## 2017-05-02 NOTE — Telephone Encounter (Signed)
Patient left message on nurse line. States she needs refill of clonazepam. Callback# [phone]

## 2017-05-06 ENCOUNTER — Other Ambulatory Visit: Payer: Self-pay | Admitting: Family Medicine

## 2017-05-11 ENCOUNTER — Other Ambulatory Visit: Payer: Self-pay | Admitting: Family Medicine

## 2017-05-11 MED ORDER — TEMAZEPAM 15 MG PO CAPS: 15.0000 mg | ORAL_CAPSULE | Freq: Every evening | ORAL | 2 refills | Status: DC | PRN

## 2017-05-31 ENCOUNTER — Other Ambulatory Visit: Payer: Self-pay | Admitting: Family Medicine

## 2017-05-31 ENCOUNTER — Emergency Department (HOSPITAL_COMMUNITY): Payer: BLUE CROSS/BLUE SHIELD

## 2017-05-31 ENCOUNTER — Encounter (HOSPITAL_COMMUNITY): Payer: Self-pay | Admitting: *Deleted

## 2017-05-31 ENCOUNTER — Emergency Department (HOSPITAL_COMMUNITY)
Admission: EM | Admit: 2017-05-31 | Discharge: 2017-06-01 | Disposition: A | Discharge status: Home / Self Care | Payer: BLUE CROSS/BLUE SHIELD | Attending: Emergency Medicine | Admitting: Emergency Medicine

## 2017-05-31 ENCOUNTER — Other Ambulatory Visit: Payer: Self-pay

## 2017-05-31 ENCOUNTER — Telehealth: Payer: Self-pay | Admitting: Family Medicine

## 2017-05-31 DIAGNOSIS — E039 Hypothyroidism, unspecified: Secondary | ICD-10-CM | POA: Diagnosis not present

## 2017-05-31 DIAGNOSIS — Z853 Personal history of malignant neoplasm of breast: Secondary | ICD-10-CM | POA: Insufficient documentation

## 2017-05-31 DIAGNOSIS — E119 Type 2 diabetes mellitus without complications: Secondary | ICD-10-CM | POA: Diagnosis not present

## 2017-05-31 DIAGNOSIS — R609 Edema, unspecified: Secondary | ICD-10-CM

## 2017-05-31 DIAGNOSIS — R2243 Localized swelling, mass and lump, lower limb, bilateral: Secondary | ICD-10-CM | POA: Diagnosis present

## 2017-05-31 DIAGNOSIS — R0902 Hypoxemia: Secondary | ICD-10-CM | POA: Insufficient documentation

## 2017-05-31 DIAGNOSIS — Z79899 Other long term (current) drug therapy: Secondary | ICD-10-CM | POA: Insufficient documentation

## 2017-05-31 DIAGNOSIS — R079 Chest pain, unspecified: Secondary | ICD-10-CM | POA: Diagnosis not present

## 2017-05-31 DIAGNOSIS — F17210 Nicotine dependence, cigarettes, uncomplicated: Secondary | ICD-10-CM | POA: Diagnosis not present

## 2017-05-31 DIAGNOSIS — R600 Localized edema: Secondary | ICD-10-CM | POA: Diagnosis not present

## 2017-05-31 LAB — CBC WITH DIFFERENTIAL/PLATELET
Basophils Absolute: 0.1 10*3/uL (ref 0.0–0.1)
Basophils Relative: 1 %
Eosinophils Absolute: 0.5 10*3/uL (ref 0.0–0.7)
Eosinophils Relative: 4 %
HCT: 45.2 % (ref 36.0–46.0)
Hemoglobin: 15.4 g/dL — ABNORMAL HIGH (ref 12.0–15.0)
Lymphocytes Relative: 45 %
Lymphs Abs: 5.5 10*3/uL — ABNORMAL HIGH (ref 0.7–4.0)
MCH: 33.4 pg (ref 26.0–34.0)
MCHC: 34.1 g/dL (ref 30.0–36.0)
MCV: 98 fL (ref 78.0–100.0)
Monocytes Absolute: 1 10*3/uL (ref 0.1–1.0)
Monocytes Relative: 8 %
Neutro Abs: 5 10*3/uL (ref 1.7–7.7)
Neutrophils Relative %: 41 %
Platelets: 415 10*3/uL — ABNORMAL HIGH (ref 150–400)
RBC: 4.61 MIL/uL (ref 3.87–5.11)
RDW: 13.3 % (ref 11.5–15.5)
WBC: 12.1 10*3/uL — ABNORMAL HIGH (ref 4.0–10.5)

## 2017-05-31 LAB — BASIC METABOLIC PANEL
Anion gap: 10 (ref 5–15)
BUN: 18 mg/dL (ref 6–20)
CO2: 29 mmol/L (ref 22–32)
Calcium: 9.6 mg/dL (ref 8.9–10.3)
Chloride: 102 mmol/L (ref 101–111)
Creatinine, Ser: 1 mg/dL (ref 0.44–1.00)
GFR calc Af Amer: >60 mL/min (ref >60)
GFR calc non Af Amer: >60 mL/min (ref >60)
Glucose, Bld: 115 mg/dL — ABNORMAL HIGH (ref 65–99)
Potassium: 3.5 mmol/L (ref 3.5–5.1)
Sodium: 141 mmol/L (ref 135–145)

## 2017-05-31 LAB — BRAIN NATRIURETIC PEPTIDE: B Natriuretic Peptide: 50 pg/mL (ref 0.0–100.0)

## 2017-05-31 LAB — D-DIMER, QUANTITATIVE: D-Dimer, Quant: 0.63 ug/mL-FEU — ABNORMAL HIGH (ref 0.00–0.50)

## 2017-05-31 LAB — TROPONIN I: Troponin I: <0.03 ng/mL (ref <0.03)

## 2017-05-31 MED ORDER — METOPROLOL TARTRATE 25 MG PO TABS: ORAL_TABLET | ORAL | 1 refills | Status: DC

## 2017-05-31 MED ORDER — VITAMIN D (ERGOCALCIFEROL) 1.25 MG (50000 UNIT) PO CAPS: 50000.0000 [IU] | ORAL_CAPSULE | ORAL | 1 refills | Status: DC

## 2017-05-31 MED ORDER — VENLAFAXINE HCL ER 75 MG PO CP24: 75.0000 mg | ORAL_CAPSULE | Freq: Every day | ORAL | 1 refills | Status: DC

## 2017-05-31 NOTE — Telephone Encounter (Signed)
Patient requesting ALL RX be sent to [facility]   Vitamin d  Venlafaxine  Metoprolol

## 2017-05-31 NOTE — Telephone Encounter (Signed)
meds sent

## 2017-05-31 NOTE — ED Notes (Signed)
Patient transported to X-ray 

## 2017-05-31 NOTE — ED Triage Notes (Signed)
Pt c/o lower extremity swelling for the past 3 weeks, when asked about any chest pain pt states " I have had chest pain on and off forever" denies any sob, does admit to pain in right foot,

## 2017-05-31 NOTE — ED Notes (Signed)
Pt states that she was seen at [facility], had chest xray performed, was told to follow up with her pcp because they thought the swelling was something to do with her heart.

## 2017-05-31 NOTE — ED Provider Notes (Signed)
[facility] DEPT Provider Note   CSN: [id number] Arrival date & time: 05/31/17  1951     History   Chief Complaint Chief Complaint  Patient presents with  . Leg Swelling    HPI Annette [name] is a 56 y.o. female with a history of breast cancer, in remission, prediabetes, hypothyroidism and gerd presenting with a 3 week history of increased ankle edema, worsened with ambulation and better when she first wakes in the am.  Her right leg has been most problematic, more swollen along with deep calf aching pain.  She denies shortness of breath, dizziness or weakness. She does have a chronic but stable nonproductive cough which she knows is from smoking and reports chronic intermittent episodes of chest tightness, fleeting, lasting a few seconds, most recently felt this symptom prior to arriving here.  She was seen at an urgent care center prior to coming here and after obtaining a chest xray, was advised to come here to get evaluated for possible CHF.  She endorses recent long car travel, 2 trips to [place] and back.  HPI  Past Medical History:  Diagnosis Date  . Breast cancer ([place]) 2007   History of  XRT, onTamoxifen  . Bruises easily   . Depression   . Diabetes mellitus    prediabetic  . GERD (gastroesophageal reflux disease)   . Hypothyroidism    following chemo amnd radiaition for breast cancer, needed replacement short term  . Hypothyroidism (acquired)    replaced x 1 year  . IBS (irritable bowel syndrome)   . Menieres disease    Controlled with triamterene   . Migraines   . Obesity   . Palpitations   . 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[place])   . Sleep apnea 2010   Problems with CPAP    Patient Active Problem List   Diagnosis Date Noted  . Bursitis of deltoid, left 12/28/2016  . Palpitations 03/27/2015  . Hyperlipidemia LDL goal <100 07/15/2014  . GERD (gastroesophageal reflux disease) 10/09/2013  . IBS (irritable bowel syndrome) 10/09/2013  . GAD (generalized  anxiety disorder) 05/14/2013  . Metabolic syndrome X 17/51/0258  . HTN, goal below 130/80 01/09/2013  . Nicotine dependence 01/09/2013  . Insomnia 08/14/2012  . Prediabetes 04/16/2012  . Chest pain, atypical 11/29/2011  . Adrenal adenoma 08/06/2011  . Meniere's disease 04/22/2011  . Sleep apnea 04/22/2011  . THYROID MASS 09/18/2010  . ABNORMAL THYROID FUNCTION TESTS 08/17/2010  . Hypothyroidism 01/15/2010  . Vitamin D deficiency 01/15/2010  . Morbid obesity ([place]) 01/01/2010  . IBS 01/01/2010  . BREAST CANCER, HX OF 01/01/2010    Past Surgical History:  Procedure Laterality Date  . ABDOMINAL HYSTERECTOMY  1998   Benign, fibroids  . 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 APPENDECTOMY  06/12/08  . BREAST RECONSTRUCTION     Left reconstructive  . BREAST SURGERY  2007   Left lumpectomy  . BREAST SURGERY     Mammosite - right side  . CHOLECYSTECTOMY  1985  . ESOPHAGEAL DILATION  12/19/2015   Procedure: ESOPHAGEAL DILATION;  Surgeon: Rogene Houston, MD;  Location: [facility];  Service: Endoscopy;;  . ESOPHAGOGASTRODUODENOSCOPY N/A 12/19/2015   Procedure: ESOPHAGOGASTRODUODENOSCOPY (EGD);  Surgeon: Rogene Houston, MD;  Location: [facility];  Service: Endoscopy;  Laterality: N/A;  11:40  . 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 THYROIDECTOMY, PARTIAL  11/05/2010   Benign disease  OB History    No data available       Home Medications    Prior to Admission medications   Medication Sig Start Date End Date Taking? Authorizing Provider  Choline Fenofibrate (FENOFIBRIC ACID) 135 MG CPDR TAKE 1 CAPSULE (135 MG TOTAL) BY MOUTH DAILY. 12/20/16  Yes Fayrene Helper, MD  clotrimazole-betamethasone (LOTRISONE) cream Apply topically as needed. 02/21/15  Yes Penland, Kelby Fam, MD  esomeprazole (NEXIUM) 40 MG capsule Take 1 capsule (40 mg total) by mouth daily. 04/08/17  Yes Fayrene Helper, MD  LORazepam (ATIVAN) 1 MG tablet TAKE ONE TABLET BY MOUTH EVERY 8 HOURS AS NEEDED FOR ANXIETY AND/OR NAUSEA 03/02/17  Yes Fayrene Helper, MD  metoprolol tartrate (LOPRESSOR) 25 MG tablet TAKE 1 & 1/2 TABLETS BY MOUTH TWO TIMES A DAY 05/31/17  Yes Fayrene Helper, MD  naproxen (NAPROSYN) 500 MG tablet TAKE 1 TABLET (500 MG TOTAL) BY MOUTH 2 (TWO) TIMES DAILY WITH A MEAL. Patient taking differently: Take 500 mg by mouth 2 (two) times daily as needed for mild pain or moderate pain.  09/28/16  Yes Fayrene Helper, MD  NON [place]  Scar Cream -  Verapamil 10%, Pentoxifylline 5% Apply 1-2 grams to affected area 3-4 times daily Qty. 120 gm 3 refills   Yes [provider]  temazepam (RESTORIL) 15 MG capsule TAKE ONE CAPSULE BY MOUTH AT BEDTIME AS NEEDED FOR SLEEP Patient taking differently: TAKE ONE CAPSULE BY MOUTH AT BEDTIME 05/11/17  Yes Fayrene Helper, MD  traZODone (DESYREL) 100 MG tablet TAKE 1 TABLET (100 MG TOTAL) BY MOUTH AT BEDTIME. 12/20/16  Yes Fayrene Helper, MD  triamterene-hydrochlorothiazide (DYAZIDE) 37.5-25 MG capsule TAKE 1 CAPSULE BY MOUTH DAILY 12/20/16  Yes Fayrene Helper, MD  venlafaxine XR (EFFEXOR-XR) 75 MG 24 hr capsule Take 1 capsule (75 mg total) by mouth daily. 05/31/17  Yes Fayrene Helper, MD  Vitamin D, Ergocalciferol, (DRISDOL) 50000 units CAPS capsule Take 1 capsule (50,000 Units total) by mouth every 7 (seven) days. 05/31/17  Yes Fayrene Helper, MD  meperidine (DEMEROL) 50 MG tablet Take 1 tablet (50 mg total) by mouth every 4 (four) hours as needed for severe pain. Patient not taking: Reported on 05/31/2017 03/21/17   Wallene Huh, DPM    Family History Family History  Problem Relation Age of Onset  . Diabetes Mother   . Heart disease Father   . Cancer Paternal Grandmother        Breast    Social History Social History  Substance Use Topics  . Smoking status: Current Every Day Smoker      Packs/day: 0.30    Years: 0.50    Types: Cigarettes  . Smokeless tobacco: Never Used     Comment: 1/2 pack a day  . Alcohol use No     Comment: Encouraged to quit smoking. She has tried the nicotone gum and patches but did not help     Allergies   Penicillins; Statins; Tape; and Zetia [ezetimibe]   Review of Systems Review of Systems  Constitutional: Negative for fever.  HENT: Negative for congestion and sore throat.   Eyes: Negative.   Respiratory: Negative for chest tightness and shortness of breath.   Cardiovascular: Positive for leg swelling. Negative for chest pain.  Gastrointestinal: Negative for abdominal pain and nausea.  Genitourinary: Negative.   Musculoskeletal: Negative for arthralgias, joint swelling and neck pain.  Skin: Negative.  Negative for rash and  wound.  Neurological: Negative for dizziness, weakness, light-headedness, numbness and headaches.  Psychiatric/Behavioral: Negative.      Physical Exam Updated Vital Signs BP (!) 105/49   Pulse 67   Temp 98.3 F (36.8 C) (Oral)   Resp 17   Ht 5\' 6"  (1.676 m)   Wt 90.7 kg (200 lb)   SpO2 98%   BMI 32.28 kg/m   Physical Exam  Constitutional: She appears well-developed and well-nourished.  HENT:  Head: Normocephalic and atraumatic.  Eyes: Conjunctivae are normal.  Neck: Normal range of motion.  Cardiovascular: Normal rate, regular rhythm, normal heart sounds and intact distal pulses.   Trace bilateral distal pretibial edema.  TTP right calf without unilateral increased swelling, cords or swelling.  Scattered spider veins without obvious varicose veins while supine.  Pulmonary/Chest: Effort normal and breath sounds normal. She has no wheezes.  Abdominal: Soft. Bowel sounds are normal. There is no tenderness.  Musculoskeletal: Normal range of motion.  Neurological: She is alert.  Skin: Skin is warm and dry.  Psychiatric: She has a normal mood and affect.  Nursing note and vitals reviewed.    ED  Treatments / Results  Labs (all labs ordered are listed, but only abnormal results are displayed) Labs Reviewed  CBC WITH DIFFERENTIAL/PLATELET - Abnormal; Notable for the following:       Result Value   WBC 12.1 (*)    Hemoglobin 15.4 (*)    Platelets 415 (*)    Lymphs Abs 5.5 (*)    All other components within normal limits  BASIC METABOLIC PANEL - Abnormal; Notable for the following:    Glucose, Bld 115 (*)    All other components within normal limits  D-DIMER, QUANTITATIVE (NOT AT [facility]) - Abnormal; Notable for the following:    D-Dimer, Quant 0.63 (*)    All other components within normal limits  BRAIN NATRIURETIC PEPTIDE  TROPONIN I  TROPONIN I    EKG  EKG Interpretation  Date/Time:  Tuesday May 31 2017 20:42:22 EDT Ventricular Rate:  63 PR Interval:  164 QRS Duration: 80 QT Interval:  430 QTC Calculation: 440 R Axis:   17 Text Interpretation:  Normal sinus rhythm Normal ECG No significant change was found Confirmed by Ezequiel Essex [phone]) on 05/31/2017 11:30:37 PM       Radiology Dg Chest 2 View  Result Date: 06/01/2017 CLINICAL DATA:  Peripheral edema mid chest pain EXAM: CHEST  2 VIEW COMPARISON:  11/03/2010 FINDINGS: The heart size and mediastinal contours are within normal limits. No pneumonic consolidations. Chronic interstitial prominence is noted bilaterally. Left breast and axillary clips are present. The visualized skeletal structures are unremarkable. IMPRESSION: No active cardiopulmonary disease. Electronically Signed   By: Ashley Royalty M.D.   On: 06/01/2017 00:00    Procedures Procedures (including critical care time)  Medications Ordered in ED Medications  iopamidol (ISOVUE-370) 76 % injection 100 mL (100 mLs Intravenous Contrast Given 06/01/17 0100)     Initial Impression / Assessment and Plan / ED Course  I have reviewed the triage vital signs and the nursing notes.  Pertinent labs & imaging results that were available during my care of  the patient were reviewed by me and considered in my medical decision making (see chart for details).     Review of labs, ekg, cxr revealing for elevated d dimer.  During pt discussion of results and need for US imaging to r/o DVT, she had a persistently low pulse ox between 88-91%, but denies any  sob.   Also with low bp but states this is chronic finding.  Discussed with Dr Wyvonnia Dusky and will proceed with Ct angio chest.  Delta trop also ordered.    Pt will still need outpatient [place] lower ext to r/u dvt if CT angio is negative tonight and pt is dispo'd home.  Dr Wyvonnia Dusky assumes pt care.  Final Clinical Impressions(s) / ED Diagnoses   Final diagnoses:  Peripheral edema  Hypoxia    New Prescriptions New Prescriptions   No medications on file     Landis Martins 06/01/17 0059    Evalee Jefferson, PA-C 06/01/17 0124    Ezequiel Essex, MD 06/01/17 [phone]

## 2017-05-31 NOTE — ED Notes (Signed)
Pt and family updated on plan of care, denies any complaints at present,

## 2017-06-01 ENCOUNTER — Emergency Department (HOSPITAL_COMMUNITY): Payer: BLUE CROSS/BLUE SHIELD

## 2017-06-01 LAB — TROPONIN I: Troponin I: <0.03 ng/mL (ref <0.03)

## 2017-06-01 MED ORDER — IOPAMIDOL (ISOVUE-370) INJECTION 76%
100.0000 mL | Freq: Once | INTRAVENOUS | Status: AC | PRN
  Administered 2017-06-01: 100 mL via INTRAVENOUS

## 2017-06-01 MED ORDER — ENOXAPARIN SODIUM 100 MG/ML ~~LOC~~ SOLN
1.0000 mg/kg | Freq: Once | SUBCUTANEOUS | Status: AC
  Administered 2017-06-01: 90 mg via SUBCUTANEOUS

## 2017-06-01 MED ORDER — ENOXAPARIN SODIUM 100 MG/ML ~~LOC~~ SOLN
SUBCUTANEOUS | Status: AC
  Filled 2017-06-01: qty 1

## 2017-06-01 NOTE — ED Notes (Signed)
Pt alert & oriented x4, stable gait. Patient given discharge instructions, paperwork & prescription(s). Patient  instructed to stop at the registration desk to finish any additional paperwork. Patient verbalized understanding. Pt left department w/ no further questions. 

## 2017-06-01 NOTE — Discharge Instructions (Signed)
There is no evidence of heart attack or blood clot in the lung. Follow-up for an ultrasound to evaluate for blood clots in your legs. Return to the ED if you develop chest pain, shortness of breath or any other concerns.

## 2017-06-03 ENCOUNTER — Emergency Department (HOSPITAL_COMMUNITY): Payer: BLUE CROSS/BLUE SHIELD

## 2017-06-03 ENCOUNTER — Encounter (HOSPITAL_COMMUNITY): Payer: Self-pay | Admitting: Emergency Medicine

## 2017-06-03 ENCOUNTER — Ambulatory Visit (HOSPITAL_COMMUNITY)
Admission: RE | Admit: 2017-06-03 | Discharge: 2017-06-03 | Disposition: A | Discharge status: Home / Self Care | Payer: BLUE CROSS/BLUE SHIELD | Source: Ambulatory Visit | Attending: Emergency Medicine | Admitting: Emergency Medicine

## 2017-06-03 ENCOUNTER — Emergency Department (HOSPITAL_COMMUNITY)
Admission: EM | Admit: 2017-06-03 | Discharge: 2017-06-03 | Disposition: A | Discharge status: Home / Self Care | Payer: BLUE CROSS/BLUE SHIELD | Attending: Emergency Medicine | Admitting: Emergency Medicine

## 2017-06-03 DIAGNOSIS — I10 Essential (primary) hypertension: Secondary | ICD-10-CM | POA: Insufficient documentation

## 2017-06-03 DIAGNOSIS — Z853 Personal history of malignant neoplasm of breast: Secondary | ICD-10-CM | POA: Insufficient documentation

## 2017-06-03 DIAGNOSIS — F17210 Nicotine dependence, cigarettes, uncomplicated: Secondary | ICD-10-CM | POA: Insufficient documentation

## 2017-06-03 DIAGNOSIS — E119 Type 2 diabetes mellitus without complications: Secondary | ICD-10-CM | POA: Insufficient documentation

## 2017-06-03 DIAGNOSIS — E039 Hypothyroidism, unspecified: Secondary | ICD-10-CM | POA: Insufficient documentation

## 2017-06-03 DIAGNOSIS — M79671 Pain in right foot: Secondary | ICD-10-CM | POA: Diagnosis not present

## 2017-06-03 DIAGNOSIS — R600 Localized edema: Secondary | ICD-10-CM

## 2017-06-03 DIAGNOSIS — Z79899 Other long term (current) drug therapy: Secondary | ICD-10-CM | POA: Insufficient documentation

## 2017-06-03 NOTE — ED Provider Notes (Signed)
[facility] DEPT Provider Note   CSN: [id number] Arrival date & time: 06/03/17  1644     History   Chief Complaint Chief Complaint  Patient presents with  . Foot Pain    HPI Annette [name] is a 56 y.o. female.  Patient is a 56 year old female who presents to the emergency department with a complaint of right foot pain and swelling.  The patient states that in June she had surgery to remove tumors from the bottom of her foot by Dr. Earle Gell. She was advised to use a special shoe and to refrain from weightbearing. However she had to go out of state for some business issues. Shortly after this her mother died and she had to be on her foot a lot managing the estate. After this the patient had to return to work and she works 12 hour shifts and is on her feet a lot. The patient states she is no longer using the special boot that she was given by podiatry. She is not elevating her feet when she is at rest. She is noticing more swelling of her feet that she noticed before the surgery and she is having more pain. She states that she's had falls recently and she was concerned that she may have done something to the surgery area, and or broke a bone area so she came to the emergency department for evaluation.      Past Medical History:  Diagnosis Date  . Breast cancer ([place]) 2007   History of  XRT, onTamoxifen  . Bruises easily   . Depression   . Diabetes mellitus    prediabetic  . GERD (gastroesophageal reflux disease)   . Hypothyroidism    following chemo amnd radiaition for breast cancer, needed replacement short term  . Hypothyroidism (acquired)    replaced x 1 year  . IBS (irritable bowel syndrome)   . Menieres disease    Controlled with triamterene   . Migraines   . Obesity   . Palpitations   . 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[place])   . Sleep apnea 2010   Problems with CPAP    Patient Active Problem List   Diagnosis Date Noted  . Bursitis of deltoid, left 12/28/2016    . Palpitations 03/27/2015  . Hyperlipidemia LDL goal <100 07/15/2014  . GERD (gastroesophageal reflux disease) 10/09/2013  . IBS (irritable bowel syndrome) 10/09/2013  . GAD (generalized anxiety disorder) 05/14/2013  . Metabolic syndrome X 46/96/2952  . HTN, goal below 130/80 01/09/2013  . Nicotine dependence 01/09/2013  . Insomnia 08/14/2012  . Prediabetes 04/16/2012  . Chest pain, atypical 11/29/2011  . Adrenal adenoma 08/06/2011  . Meniere's disease 04/22/2011  . Sleep apnea 04/22/2011  . THYROID MASS 09/18/2010  . ABNORMAL THYROID FUNCTION TESTS 08/17/2010  . Hypothyroidism 01/15/2010  . Vitamin D deficiency 01/15/2010  . Morbid obesity ([place]) 01/01/2010  . IBS 01/01/2010  . BREAST CANCER, HX OF 01/01/2010    Past Surgical History:  Procedure Laterality Date  . ABDOMINAL HYSTERECTOMY  1998   Benign, fibroids  . 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 APPENDECTOMY  06/12/08  . BREAST RECONSTRUCTION     Left reconstructive  . BREAST SURGERY  2007   Left lumpectomy  . BREAST SURGERY     [facility] - right side  . CHOLECYSTECTOMY  1985  . ESOPHAGEAL DILATION  12/19/2015   Procedure: ESOPHAGEAL DILATION;  Surgeon: Rogene Houston, MD;  Location: [facility];  Service: Endoscopy;;  .  ESOPHAGOGASTRODUODENOSCOPY N/A 12/19/2015   Procedure: ESOPHAGOGASTRODUODENOSCOPY (EGD);  Surgeon: Rogene Houston, MD;  Location: [facility] END[facility] SU[facility];  Service: Endoscopy;  Laterality: N/A;  11:40  . 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 THYROIDECTOMY, PARTIAL  11/05/2010   Benign disease    OB History    No data available       Home Medications    Prior to Admission medications   Medication Sig Start Date End Date Taking? Authorizing Provider  Choline Fenofibrate (FENOFIBRIC ACID) 135 MG CPDR TAKE 1 CAPSULE (135 MG TOTAL) BY MOUTH DAILY. 12/20/16   Fayrene Helper, MD  clotrimazole-betamethasone  (LOTRISONE) cream Apply topically as needed. 02/21/15   Penland, Kelby Fam, MD  esomeprazole (NEXIUM) 40 MG capsule Take 1 capsule (40 mg total) by mouth daily. 04/08/17   Fayrene Helper, MD  LORazepam (ATIVAN) 1 MG tablet TAKE ONE TABLET BY MOUTH EVERY 8 HOURS AS NEEDED FOR ANXIETY AND/OR NAUSEA 03/02/17   Fayrene Helper, MD  meperidine (DEMEROL) 50 MG tablet Take 1 tablet (50 mg total) by mouth every 4 (four) hours as needed for severe pain. Patient not taking: Reported on 05/31/2017 03/21/17   Wallene Huh, DPM  metoprolol tartrate (LOPRESSOR) 25 MG tablet TAKE 1 & 1/2 TABLETS BY MOUTH TWO TIMES A DAY 05/31/17   Fayrene Helper, MD  naproxen (NAPROSYN) 500 MG tablet TAKE 1 TABLET (500 MG TOTAL) BY MOUTH 2 (TWO) TIMES DAILY WITH A MEAL. Patient taking differently: Take 500 mg by mouth 2 (two) times daily as needed for mild pain or moderate pain.  09/28/16   Fayrene Helper, MD  NON [place]  Scar Cream -  Verapamil 10%, Pentoxifylline 5% Apply 1-2 grams to affected area 3-4 times daily Qty. 120 gm 3 refills    [provider]  temazepam (RESTORIL) 15 MG capsule TAKE ONE CAPSULE BY MOUTH AT BEDTIME AS NEEDED FOR SLEEP Patient taking differently: TAKE ONE CAPSULE BY MOUTH AT BEDTIME 05/11/17   Fayrene Helper, MD  traZODone (DESYREL) 100 MG tablet TAKE 1 TABLET (100 MG TOTAL) BY MOUTH AT BEDTIME. 12/20/16   Fayrene Helper, MD  triamterene-hydrochlorothiazide (DYAZIDE) 37.5-25 MG capsule TAKE 1 CAPSULE BY MOUTH DAILY 12/20/16   Fayrene Helper, MD  venlafaxine XR (EFFEXOR-XR) 75 MG 24 hr capsule Take 1 capsule (75 mg total) by mouth daily. 05/31/17   Fayrene Helper, MD  Vitamin D, Ergocalciferol, (DRISDOL) 50000 units CAPS capsule Take 1 capsule (50,000 Units total) by mouth every 7 (seven) days. 05/31/17   Fayrene Helper, MD    Family History Family History  Problem Relation Age of Onset  . Diabetes Mother   . Heart disease Father     . Cancer Paternal Grandmother        Breast    Social History Social History  Substance Use Topics  . Smoking status: Current Every Day Smoker    Packs/day: 0.30    Years: 0.50    Types: Cigarettes  . Smokeless tobacco: Never Used     Comment: 1/2 pack a day  . Alcohol use No     Comment: Encouraged to quit smoking. She has tried the nicotone gum and patches but did not help     Allergies   Penicillins; Statins; Tape; and Zetia [ezetimibe]   Review of Systems Review of Systems  Constitutional: Negative for activity change.       All ROS  Neg except as noted in HPI  HENT: Negative for nosebleeds.   Eyes: Negative for photophobia and discharge.  Respiratory: Negative for cough, shortness of breath and wheezing.   Cardiovascular: Negative for chest pain and palpitations.  Gastrointestinal: Negative for abdominal pain and blood in stool.  Genitourinary: Negative for dysuria, frequency and hematuria.  Musculoskeletal: Negative for arthralgias, back pain and neck pain.       Foot pain  Skin: Negative.   Neurological: Negative for dizziness, seizures and speech difficulty.  Psychiatric/Behavioral: Negative for confusion and hallucinations.     Physical Exam Updated Vital Signs BP (!) 107/59   Pulse 95   Temp 98 F (36.7 C)   Resp 18   Ht 5\' 6"  (1.676 m)   Wt 90.7 kg (200 lb)   SpO2 96%   BMI 32.28 kg/m   Physical Exam  Constitutional: She is oriented to person, place, and time. She appears well-developed and well-nourished.  Non-toxic appearance.  HENT:  Head: Normocephalic.  Right Ear: Tympanic membrane and external ear normal.  Left Ear: Tympanic membrane and external ear normal.  Eyes: Pupils are equal, round, and reactive to light. EOM and lids are normal.  Neck: Normal range of motion. Neck supple. Carotid bruit is not present.  Cardiovascular: Normal rate, regular rhythm, normal heart sounds, intact distal pulses and normal pulses.   Pulmonary/Chest:  Breath sounds normal. No respiratory distress.  Abdominal: Soft. Bowel sounds are normal. There is no tenderness. There is no guarding.  Musculoskeletal: She exhibits tenderness.  There is good range of motion of the right hip and knee. There is no excessive swelling of the calf. There no cords appreciated. Negative Homans signs. The Achilles tendon is intact. The dorsalis pedis pulses 2+ bilaterally. The posterior tibial pulses 2+ bilaterally. Capillary refill is less than 2 seconds. The patient has a well-healed surgical scar at the plantar surface. There is one raised area with in the surgical scar on the plantar surface. This area is not hot and there no red streaks appreciated. There no lesions between the toes. There is soreness to palpation as well as range of motion movement at the dorsum of the right foot and ankle area.  Lymphadenopathy:       Head (right side): No submandibular adenopathy present.       Head (left side): No submandibular adenopathy present.    She has no cervical adenopathy.  Neurological: She is alert and oriented to person, place, and time. She has normal strength. No cranial nerve deficit or sensory deficit.  Skin: Skin is warm and dry.  Psychiatric: She has a normal mood and affect. Her speech is normal.  Nursing note and vitals reviewed.    ED Treatments / Results  Labs (all labs ordered are listed, but only abnormal results are displayed) Labs Reviewed - No data to display  EKG  EKG Interpretation None       Radiology US Venous Img Lower Bilateral  Result Date: 06/03/2017 CLINICAL DATA:  56 year old female with bilateral lower extremity edema for the past 3 weeks with some pain. EXAM: BILATERAL LOWER EXTREMITY VENOUS DOPPLER ULTRASOUND TECHNIQUE: Gray-scale sonography with graded compression, as well as color Doppler and duplex ultrasound were performed to evaluate the lower extremity deep venous systems from the level of the common femoral vein and  including the common femoral, femoral, profunda femoral, popliteal and calf veins including the posterior tibial, peroneal and gastrocnemius veins when visible. The superficial great saphenous vein was also interrogated.  Spectral Doppler was utilized to evaluate flow at rest and with distal augmentation maneuvers in the common femoral, femoral and popliteal veins. COMPARISON:  No priors. FINDINGS: RIGHT LOWER EXTREMITY Common Femoral Vein: No evidence of thrombus. Normal compressibility, respiratory phasicity and response to augmentation. Saphenofemoral Junction: No evidence of thrombus. Normal compressibility and flow on color Doppler imaging. Profunda Femoral Vein: No evidence of thrombus. Normal compressibility and flow on color Doppler imaging. Femoral Vein: No evidence of thrombus. Normal compressibility, respiratory phasicity and response to augmentation. Popliteal Vein: No evidence of thrombus. Normal compressibility, respiratory phasicity and response to augmentation. Calf Veins: No evidence of thrombus. Normal compressibility and flow on color Doppler imaging. Superficial Great Saphenous Vein: No evidence of thrombus. Normal compressibility and flow on color Doppler imaging. Venous Reflux:  None. Other Findings: Soft tissue edema in the subcutaneous fat of the right calf. LEFT LOWER EXTREMITY Common Femoral Vein: No evidence of thrombus. Normal compressibility, respiratory phasicity and response to augmentation. Saphenofemoral Junction: No evidence of thrombus. Normal compressibility and flow on color Doppler imaging. Profunda Femoral Vein: No evidence of thrombus. Normal compressibility and flow on color Doppler imaging. Femoral Vein: No evidence of thrombus. Normal compressibility, respiratory phasicity and response to augmentation. Popliteal Vein: No evidence of thrombus. Normal compressibility, respiratory phasicity and response to augmentation. Calf Veins: No evidence of thrombus. Normal  compressibility and flow on color Doppler imaging. Superficial Great Saphenous Vein: No evidence of thrombus. Normal compressibility and flow on color Doppler imaging. Venous Reflux:  None. Other Findings:  None. IMPRESSION: No evidence of DVT within either lower extremity. Electronically Signed   By: Vinnie Langton M.D.   On: 06/03/2017 16:29   Dg Foot Complete Right  Result Date: 06/03/2017 CLINICAL DATA:  Right foot pain and swelling x3 weeks. EXAM: RIGHT FOOT COMPLETE - 3+ VIEW COMPARISON:  03/21/2017 radiographs FINDINGS: There is no evidence of fracture or dislocation. There is no evidence of arthropathy or other focal bone abnormality. There is a small plantar calcaneal enthesophyte, similar in appearance. Soft tissues are unremarkable. IMPRESSION: No acute fracture or dislocations of the right foot. Small plantar calcaneal enthesophyte. Electronically Signed   By: Ashley Royalty M.D.   On: 06/03/2017 17:30    Procedures Procedures (including critical care time)  Medications Ordered in ED Medications - No data to display   Initial Impression / Assessment and Plan / ED Course  I have reviewed the triage vital signs and the nursing notes.  Pertinent labs & imaging results that were available during my care of the patient were reviewed by me and considered in my medical decision making (see chart for details).       Final Clinical Impressions(s) / ED Diagnoses MDM I reviewed the x-ray of the right foot. There is no fracture or dislocation or gas or other abnormality. There is a small plantar calcaneal enthesophyte. I also reviewed the ultrasound that was done today and it is negative for DVT or other vascular problems.  After reviewing the patient's history and the previous events, I think the patient is using tendons, ligaments, and muscles of the right foot and ankle to protect the previous surgery area. I reminded the patient that the surgical procedures can sometimes take several  months to completely heal.  I suggested to the patient to make an appointment to see Dr. Paulla Dolly as soon as possible. I've also suggested to her to use warm Epsom salt soaks. And to have her feet elevated above her waist when sitting and  above her heart when lying down. Also asked the patient to use the special shoe/boot that she was given after her surgery. It was reemphasized the importance of her seeing Dr. Paulla Dolly for podiatry evaluation. Patient is in agreement with this plan.    Final diagnoses:  Foot pain, right    New Prescriptions New Prescriptions   No medications on file     Lily Kocher, Hershal Coria 06/03/17 [place], [place], DO 06/03/17 2101

## 2017-06-03 NOTE — Discharge Instructions (Signed)
Your vital signs are within normal limits. I suspect your pain is related to the previous surgery that she had, and applying weight to your foot may be too early. There is also a possibility of foot pain because of a difference in her walking pattern. No evidence of vascular problems, neurologic problems, or infection problems on tonight's examination. Please use warm Epsom salt soaks. Please elevate her feet and resting and at bedtime. Please discuss your continued pain with Dr. Earle Gell.

## 2017-06-03 NOTE — ED Triage Notes (Signed)
Pt c/o right foot pain and swelling x 3 weeks. Pt had outpt [place] today negative for DVT.

## 2017-06-07 ENCOUNTER — Ambulatory Visit (HOSPITAL_COMMUNITY): Payer: BLUE CROSS/BLUE SHIELD

## 2017-06-09 ENCOUNTER — Ambulatory Visit (HOSPITAL_COMMUNITY): Payer: BLUE CROSS/BLUE SHIELD

## 2017-06-14 ENCOUNTER — Ambulatory Visit (INDEPENDENT_AMBULATORY_CARE_PROVIDER_SITE_OTHER): Payer: BLUE CROSS/BLUE SHIELD | Admitting: Family Medicine

## 2017-06-14 ENCOUNTER — Encounter (HOSPITAL_COMMUNITY): Payer: Self-pay

## 2017-06-14 ENCOUNTER — Encounter: Payer: Self-pay | Admitting: Family Medicine

## 2017-06-14 ENCOUNTER — Encounter (HOSPITAL_COMMUNITY): Payer: BLUE CROSS/BLUE SHIELD | Attending: Oncology | Admitting: Oncology

## 2017-06-14 VITALS — BP 120/64 | HR 66 | Temp 98.4°F | Resp 16 | Ht 66.0 in | Wt 218.5 lb

## 2017-06-14 DIAGNOSIS — Z853 Personal history of malignant neoplasm of breast: Secondary | ICD-10-CM

## 2017-06-14 DIAGNOSIS — E559 Vitamin D deficiency, unspecified: Secondary | ICD-10-CM

## 2017-06-14 DIAGNOSIS — I10 Essential (primary) hypertension: Secondary | ICD-10-CM

## 2017-06-14 DIAGNOSIS — F5101 Primary insomnia: Secondary | ICD-10-CM

## 2017-06-14 DIAGNOSIS — R7303 Prediabetes: Secondary | ICD-10-CM

## 2017-06-14 DIAGNOSIS — E785 Hyperlipidemia, unspecified: Secondary | ICD-10-CM

## 2017-06-14 DIAGNOSIS — Z923 Personal history of irradiation: Secondary | ICD-10-CM

## 2017-06-14 DIAGNOSIS — Z9221 Personal history of antineoplastic chemotherapy: Secondary | ICD-10-CM | POA: Diagnosis not present

## 2017-06-14 DIAGNOSIS — E8881 Metabolic syndrome: Secondary | ICD-10-CM

## 2017-06-14 DIAGNOSIS — E039 Hypothyroidism, unspecified: Secondary | ICD-10-CM | POA: Diagnosis not present

## 2017-06-14 DIAGNOSIS — F17218 Nicotine dependence, cigarettes, with other nicotine-induced disorders: Secondary | ICD-10-CM | POA: Diagnosis not present

## 2017-06-14 NOTE — Patient Instructions (Addendum)
PHYSICAL EXAM IN 4 TO 6 WEEKS, CALL IF YOU NEED ME BEFORE  YOU  Are referred for lung function tests at the hospital due to low oxygen when in the ED   CBC, lipid, hepatic, HBA1C, tSH anfd Vit D today please   Thank you  for choosing [facility]. We consider it a privelige to serve you.  Delivering excellent health care in a caring and  compassionate way is our goal.  Partnering with you,  so that together we can achieve this goal is our strategy.    It is important that you exercise regularly at least 30 minutes 5 times a week. If you develop chest pain, have severe difficulty breathing, or feel very tired, stop exercising immediately and seek medical attention

## 2017-06-14 NOTE — Progress Notes (Signed)
Annette Helper, MD  [address] / [place] [address]   DIAGNOSIS: Stage I breast cancer, HER-2/neu overexpressed, originally diagnosed 12/10/2005, status post lumpectomy, sentinel lobe biopsy,  Taxotere and Cytoxan plus Herceptin for 6 cycles followed by additional Herceptin out to 52 weeks with radiation therapy administered after the completion of chemotherapy, followed by 5 years of tamoxifen until November of 2012.   She has also undergone left adrenalectomy with inadvertent damage to the spleen at the time of surgery requiring splenectomy on 02/16/2012 with a history of an enlarging left adrenal tumor in the setting of bilateral adrenal adenomas by imaging.  CURRENT THERAPY: Observation  INTERVAL HISTORY: Annette [name] 56 y.o. female returns for follow up of a history of Stage I Her 2 positive, ER+ left breast cancer diagnosed back in 2007.   Annette [name] returns to the [facility] today unaccompanied. She states that since her last visit she had foot surgery with removal of spurs from her foot. She was having pain in her right foot because she was not wearing the boot like she was supposed to after surgery. She also had a lower extremity swelling about 2 weeks ago and went to the ED and had a CT angiogram of the chest as well as bilateral lower extremity Doppler venous ultrasound which were negative for DVT. Her bilateral edema has improved. She recently lost her mother as well. She denies palpating any breast masses or any axillary lymphadenopathy. She has intermittent constipation. Complains of fatigue. She also has chronic pains in her hips and back.   MEDICAL HISTORY: Past Medical History:  Diagnosis Date  . Breast cancer ([place]) 2007   History of  XRT, onTamoxifen  . Bruises easily   . Depression   . Diabetes mellitus    prediabetic  . GERD (gastroesophageal reflux disease)   . Hypothyroidism    following chemo amnd radiaition for breast cancer,  needed replacement short term  . Hypothyroidism (acquired)    replaced x 1 year  . IBS (irritable bowel syndrome)   . Menieres disease    Controlled with triamterene   . Migraines   . Obesity   . Palpitations   . 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[place])   . Sleep apnea 2010   Problems with CPAP    has Hypothyroidism; Vitamin D deficiency; Morbid obesity ([place]); IBS; ABNORMAL THYROID FUNCTION TESTS; BREAST CANCER, HX OF; Meniere's disease; Sleep apnea; Adrenal adenoma; Chest pain, atypical; Prediabetes; Insomnia; HTN, goal below 130/80; Nicotine dependence; GAD (generalized anxiety disorder); Metabolic syndrome X; GERD (gastroesophageal reflux disease); IBS (irritable bowel syndrome); Hyperlipidemia LDL goal <100; Palpitations; and Bursitis of deltoid, left on her problem list.     is allergic to penicillins; statins; tape; and zetia [ezetimibe].   History of vitamin D deficiency. Prescribed once a week treatment, with re-fills. Voiced issues with sometimes running out of medication during months with 5 weeks instead of 4.  Annette [name] had no medications administered during this visit.   SURGICAL HISTORY: Past Surgical History:  Procedure Laterality Date  . ABDOMINAL HYSTERECTOMY  1998   Benign, fibroids  . 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 APPENDECTOMY  06/12/08  . BREAST RECONSTRUCTION     Left reconstructive  . BREAST SURGERY  2007   Left lumpectomy  . BREAST SURGERY     Mammosite - right side  . CHOLECYSTECTOMY  1985  . ESOPHAGEAL DILATION  12/19/2015  Procedure: ESOPHAGEAL DILATION;  Surgeon: Rogene Houston, MD;  [facility]: [facility];  Service: Endoscopy;;  . ESOPHAGOGASTRODUODENOSCOPY N/A 12/19/2015   Procedure: ESOPHAGOGASTRODUODENOSCOPY (EGD);  Surgeon: Rogene Houston, MD;  Location: [facility];  Service: Endoscopy;  Laterality: N/A;  11:40  . SPLENECTOMY, TOTAL  2/70/6237   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    . THYROIDECTOMY, PARTIAL  11/05/2010   Benign disease   Dr. Benjamine Mola removed benign tumor on lymph node, five to six years previous Previous colonoscopy Sleep apnea   SOCIAL HISTORY: Social History   Social History  . Marital status: Married    Spouse name: N/A  . Number of children: N/A  . Years of education: N/A   Occupational History  . Not on file.   Social History Main Topics  . Smoking status: Current Every Day Smoker    Packs/day: 0.30    Years: 0.50    Types: Cigarettes  . Smokeless tobacco: Never Used     Comment: 1/2 pack a day  . Alcohol use No     Comment: Encouraged to quit smoking. She has tried the nicotone gum and patches but did not help  . Drug use: No  . Sexual activity: Yes    Birth control/ protection: Surgical   Other Topics Concern  . Not on file   Social History Narrative  . No narrative on file   Currently still smokes 1 1/2  To 2 packs of cigarettes a day. Communicated frustration from difficulty getting information during spleen removal with doctors in the past, not from this facility. Receives yearly flu vaccination  FAMILY HISTORY: Family History  Problem Relation Age of Onset  . Diabetes Mother   . Heart disease Father   . Cancer Paternal Grandmother        Breast    Review of Systems  Constitutional: Positive for malaise/fatigue. Negative for chills, fever and weight loss.  HENT: Negative for congestion, hearing loss, nosebleeds, sore throat and tinnitus.   Eyes: Negative for blurred vision, double vision, pain and discharge.  Respiratory: Negative for cough, hemoptysis, sputum production, shortness of breath and wheezing.   Cardiovascular: Positive for leg swelling. Negative for chest pain, palpitations, claudication and PND.  Gastrointestinal: Positive for constipation. Negative for abdominal pain, blood in stool, diarrhea, heartburn, melena, nausea and vomiting.  Genitourinary: Negative for dysuria, frequency, hematuria and  urgency.  Musculoskeletal: Negative for back pain, falls, joint pain and myalgias.  Skin: Negative for itching and rash.  Neurological: Negative for dizziness, tingling, tremors, sensory change, speech change, focal weakness, seizures, loss of consciousness, weakness and headaches.  Endo/Heme/Allergies: Does not bruise/bleed easily.  Psychiatric/Behavioral: Negative for depression, memory loss, substance abuse and suicidal ideas. The patient is not nervous/anxious and does not have insomnia.     14 point review of systems was performed and is negative except as detailed under history of present illness and above   PHYSICAL EXAMINATION  ECOG PERFORMANCE STATUS: 1 - Symptomatic but completely ambulatory Blood pressure 92/37, pulse 64, respirations 16, O2 sat 98%  Physical Exam  Constitutional: She is oriented to person, place, and time and well-developed, well-nourished, and in no distress.  HENT:  Head: Normocephalic and atraumatic.  Nose: Nose normal.  Mouth/Throat: Oropharynx is clear and moist. No oropharyngeal exudate.  Eyes: Pupils are equal, round, and reactive to light. Conjunctivae and EOM are normal. Right eye exhibits no discharge. Left eye exhibits no discharge. No scleral icterus.  Neck: Normal range of  motion. Neck supple. No tracheal deviation present. No thyromegaly present.  Cardiovascular: Normal rate, regular rhythm and normal heart sounds.  Exam reveals no gallop and no friction rub.   No murmur heard. Pulmonary/Chest: Effort normal and breath sounds normal. She has no wheezes. She has no rales.  Abdominal: Soft. Bowel sounds are normal. She exhibits no distension and no mass. There is no tenderness. There is no rebound and no guarding.  Musculoskeletal: Normal range of motion. She exhibits no edema.  Lymphadenopathy:    She has no cervical adenopathy.  Neurological: She is alert and oriented to person, place, and time. She has normal reflexes. No cranial nerve  deficit. Gait normal. Coordination normal.  Skin: Skin is warm and dry. No rash noted.  Psychiatric: Mood, memory, affect and judgment normal.  Nursing note and vitals reviewed. Breast:Right breast reduction via lollipop procedure. Right axillary region has no palpable abnormalities. Right breast no palpable abnormalities, no suspicious skin changes. Left breast with scar tissue at the 3-5 o'clock position, no masses palpated. Left axillary region is normal. Left breast is smaller than the right breast.  LABORATORY DATA: i have reviewed the data as listed.  CBC    Component Value Date/Time   WBC 12.1 (H) 05/31/2017 2141   RBC 4.61 05/31/2017 2141   HGB 15.4 (H) 05/31/2017 2141   HCT 45.2 05/31/2017 2141   PLT 415 (H) 05/31/2017 2141   MCV 98.0 05/31/2017 2141   MCH 33.4 05/31/2017 2141   MCHC 34.1 05/31/2017 2141   RDW 13.3 05/31/2017 2141   LYMPHSABS 5.5 (H) 05/31/2017 2141   MONOABS 1.0 05/31/2017 2141   EOSABS 0.5 05/31/2017 2141   BASOSABS 0.1 05/31/2017 2141   CMP     Component Value Date/Time   NA 141 05/31/2017 2141   K 3.5 05/31/2017 2141   CL 102 05/31/2017 2141   CO2 29 05/31/2017 2141   GLUCOSE 115 (H) 05/31/2017 2141   BUN 18 05/31/2017 2141   CREATININE 1.00 05/31/2017 2141   CREATININE 0.74 11/15/2014 1119   CALCIUM 9.6 05/31/2017 2141   PROT 6.8 11/15/2014 1119   ALBUMIN 3.9 11/15/2014 1119   AST 16 11/15/2014 1119   ALT 14 11/15/2014 1119   ALKPHOS 65 11/15/2014 1119   BILITOT 0.6 11/15/2014 1119   GFRNONAA >60 05/31/2017 2141   GFRNONAA 81 11/26/2013 1437   GFRAA >60 05/31/2017 2141   GFRAA >89 11/26/2013 1437     ASSESSMENT and THERAPY PLAN:  Stage I left breast cancer, HER-2/neu overexpressed, originally diagnosed 12/10/2005, status post lumpectomy, sentinel lode biopsy,  Taxotere and Cytoxan plus Herceptin for 6 cycles followed by additional Herceptin out to 52 weeks with radiation therapy administered after the completion of chemotherapy,  followed by 5 years of tamoxifen until November of 2012.    Her exam today was without obvious recurrence. Her last bilateral screening mammogram in July 2018 was negative. I have already ordered her mammogram for next year.   We will continue with yearly breast exams.   RTC in 1 year for follow up.    All questions were answered. The patient knows to call the clinic with any problems, questions or concerns. We can certainly see the patient much sooner if necessary.  Orders Placed This Encounter  Procedures  . MM Digital Screening    Standing Status:   Future    Standing Expiration Date:   06/14/2018    Order Specific Question:   Reason for Exam (SYMPTOM  OR DIAGNOSIS REQUIRED)  Answer:   annual screening mammogram    Order Specific Question:   Is the patient pregnant?    Answer:   No    Order Specific Question:   Preferred imaging location?    Answer:   [facility]    This note was electronically signed.  Twana First MD

## 2017-06-14 NOTE — Progress Notes (Signed)
Annette [name]     MRN: [id number]      DOB: 1961/10/03   HPI [name] for ed f/u of right foot pain, wearing a boot for 2nd of 2 weeks reently had right foot surgery , probabaly 75% better, current plan is unless she regresses no intention of seeing podiatrist  Concern expressed by ED physician was that her oxygen level was at one time in the 80's , she has a long /o nicotine use , and likely has lung disease as a result, has not been tested and uses no inhalers regularly, is interested in checking this  C/o excess weight gain ROS Denies recent fever or chills. Denies sinus pressure, nasal congestion, ear pain or sore throat. Denies chest congestion, productive cough or wheezing. Denies chest pains, palpitations and leg swelling Denies abdominal pain, nausea, vomiting,diarrhea or constipation.   Denies dysuria, frequency, hesitancy or incontinence. . Denies headaches, seizures, numbness, or tingling. Denies depression, anxiety or insomnia. Denies skin break down or rash.   PE  BP 120/64 (BP Location: Left Arm, Patient Position: Sitting, Cuff Size: Normal)   Pulse 66   Temp 98.4 F (36.9 C) (Other (Comment))   Resp 16   Ht 5\' 6"  (1.676 m)   Wt 218 lb 8 oz (99.1 kg)   SpO2 97%   BMI 35.27 kg/m   Patient alert and oriented and in no cardiopulmonary distress.  HEENT: No facial asymmetry, EOMI,   oropharynx pink and moist.  Neck supple no JVD, no mass.  Chest: Clear to auscultation bilaterally.decreased air entry   CVS: S1, S2 no murmurs, no S3.Regular rate.  ABD: Soft non tender.   Ext: No edema  MS: Adequate ROM spine, shoulders, hips and knees.  Skin: Intact, no ulcerations or rash noted.  Psych: Good eye contact, normal affect. Memory intact not anxious or depressed appearing.  CNS: CN 2-12 intact, power,  normal throughout.no focal deficits noted.   Assessment & Plan  HTN, goal below 130/80 Controlled, no change in medication DASH diet and  commitment to daily physical activity for a minimum of 30 minutes discussed and encouraged, as a part of hypertension management. The importance of attaining a healthy weight is also discussed.  BP/Weight 06/14/2017 06/03/2017 06/01/2017 05/31/2017 03/21/2017 02/25/2017 0/94/7096  Systolic BP 283 662 947 - 654 650 354  Diastolic BP 64 59 49 - 68 72 74  Wt. (Lbs) 218.5 200 - 200 - 200 215.4  BMI 35.27 32.28 - 32.28 - 32.28 34.77       Hyperlipidemia LDL goal <100 Hyperlipidemia:Low fat diet discussed and encouraged.   Lipid Panel  Lab Results  Component Value Date   CHOL 200 (H) 06/14/2017   HDL 38 (L) 06/14/2017   LDLCALC 143 (H) 06/14/2017   TRIG 94 06/14/2017   CHOLHDL 5.3 (H) 06/14/2017   uncontrolled    Nicotine dependence Patient is asked and  confirms current  Nicotine use.  Five to seven minutes of time is spent in counseling the patient of the need to quit smoking  Advice to quit is delivered clearly specifically in reducing the risk of developing heart disease, having a stroke, or of developing all types of cancer, especially lung and oral cancer. Improvement in breathing and exercise tolerance and quality of life is also discussed, as is the economic benefit.  Assessment of willingness to quit or to make an attempt to quit is made and documented  Assistance in quit attempt is made with several and  varied options presented, based on patient's desire and need. These include  literature, local classes available, 1800 QUIT NOW number, OTC and prescription medication.  The GOAL to be NICOTINE FREE is re emphasized.  The patient has set a personal goal of either reduction or discontinuation and follow up is arranged between 6 an 16 weeks.    Insomnia Controlled, no change in medication   Prediabetes Patient educated about the importance of limiting  Carbohydrate intake , the need to commit to daily physical activity for a minimum of 30 minutes , and to commit weight  loss. The fact that changes in all these areas will reduce or eliminate all together the development of diabetes is stressed.   Diabetic Labs Latest Ref Rng & Units 06/14/2017 05/31/2017 07/17/2015 11/15/2014 07/12/2014  HbA1c 4.8 - 5.6 % 7.2(H) - 6.4(A) 6.0(H) 5.9(H)  Microalbumin 0.00 - 1.89 mg/dL - - - - -  Micro/Creat Ratio 0.0 - 30.0 mg/g - - - - -  Chol 100 - 199 mg/dL 200(H) - 196 261(H) 255(H)  HDL >39 mg/dL 38(L) - - 52 42  Calc LDL 0 - 99 mg/dL 143(H) - 132 184(H) 184(H)  Triglycerides 0 - 149 mg/dL 94 - - 125 146  Creatinine 0.44 - 1.00 mg/dL - 1.00 - 0.74 0.83   BP/Weight 06/14/2017 06/03/2017 06/01/2017 05/31/2017 03/21/2017 02/25/2017 1/74/0814  Systolic BP 481 856 314 - 970 263 785  Diastolic BP 64 59 49 - 68 72 74  Wt. (Lbs) 218.5 200 - 200 - 200 215.4  BMI 35.27 32.28 - 32.28 - 32.28 34.77   Foot/eye exam completion dates 07/15/2014  Foot Form Completion Done   Updated lab needed .   Morbid obesity Deteriorated. Patient re-educated about  the importance of commitment to a  minimum of 150 minutes of exercise per week.  The importance of healthy food choices with portion control discussed. Encouraged to start a food diary, count calories and to consider  joining a support group. Sample diet sheets offered. Goals set by the patient for the next several months.   Weight /BMI 06/14/2017 06/03/2017 05/31/2017  WEIGHT 218 lb 8 oz 200 lb 200 lb  HEIGHT 5\' 6"  5\' 6"  5\' 6"   BMI 35.27 kg/m2 32.28 kg/m2 32.28 kg/m2      Metabolic syndrome X The increased risk of cardiovascular disease associated with this diagnosis, and the need to consistently work on lifestyle to change this is discussed. Following  a  heart healthy diet ,commitment to 30 minutes of exercise at least 5 days per week, as well as control of blood sugar and cholesterol , and achieving a healthy weight are all the areas to be addressed .

## 2017-06-15 LAB — LIPID PANEL
Chol/HDL Ratio: 5.3 ratio — ABNORMAL HIGH (ref 0.0–4.4)
Cholesterol, Total: 200 mg/dL — ABNORMAL HIGH (ref 100–199)
HDL: 38 mg/dL — ABNORMAL LOW (ref >39)
LDL Calculated: 143 mg/dL — ABNORMAL HIGH (ref 0–99)
Triglycerides: 94 mg/dL (ref 0–149)
VLDL Cholesterol Cal: 19 mg/dL (ref 5–40)

## 2017-06-15 LAB — CBC
Hematocrit: 45.3 % (ref 34.0–46.6)
Hemoglobin: 15.2 g/dL (ref 11.1–15.9)
MCH: 32.9 pg (ref 26.6–33.0)
MCHC: 33.6 g/dL (ref 31.5–35.7)
MCV: 98 fL — ABNORMAL HIGH (ref 79–97)
Platelets: 439 10*3/uL — ABNORMAL HIGH (ref 150–379)
RBC: 4.62 x10E6/uL (ref 3.77–5.28)
RDW: 12.8 % (ref 12.3–15.4)
WBC: 12 10*3/uL — ABNORMAL HIGH (ref 3.4–10.8)

## 2017-06-15 LAB — HEPATIC FUNCTION PANEL
ALT: 12 IU/L (ref 0–32)
AST: 13 IU/L (ref 0–40)
Albumin: 4.2 g/dL (ref 3.5–5.5)
Alkaline Phosphatase: 85 IU/L (ref 39–117)
Bilirubin Total: 0.4 mg/dL (ref 0.0–1.2)
Bilirubin, Direct: 0.11 mg/dL (ref 0.00–0.40)
Total Protein: 7 g/dL (ref 6.0–8.5)

## 2017-06-15 LAB — HEMOGLOBIN A1C
Est. average glucose Bld gHb Est-mCnc: 160 mg/dL
Hgb A1c MFr Bld: 7.2 % — ABNORMAL HIGH (ref 4.8–5.6)

## 2017-06-15 LAB — TSH: TSH: 4.13 u[IU]/mL (ref 0.450–4.500)

## 2017-06-15 LAB — VITAMIN D 25 HYDROXY (VIT D DEFICIENCY, FRACTURES): Vit D, 25-Hydroxy: 49.2 ng/mL (ref 30.0–100.0)

## 2017-06-16 ENCOUNTER — Other Ambulatory Visit: Payer: Self-pay | Admitting: Family Medicine

## 2017-06-16 ENCOUNTER — Encounter: Payer: Self-pay | Admitting: Family Medicine

## 2017-06-16 MED ORDER — METFORMIN HCL 500 MG PO TABS: 500.0000 mg | ORAL_TABLET | Freq: Two times a day (BID) | ORAL | 1 refills | Status: DC

## 2017-06-16 NOTE — Progress Notes (Unsigned)
Metformin 

## 2017-06-18 NOTE — Assessment & Plan Note (Signed)
Deteriorated. Patient re-educated about  the importance of commitment to a  minimum of 150 minutes of exercise per week.  The importance of healthy food choices with portion control discussed. Encouraged to start a food diary, count calories and to consider  joining a support group. Sample diet sheets offered. Goals set by the patient for the next several months.   Weight /BMI 06/14/2017 06/03/2017 05/31/2017  WEIGHT 218 lb 8 oz 200 lb 200 lb  HEIGHT 5\' 6"  5\' 6"  5\' 6"   BMI 35.27 kg/m2 32.28 kg/m2 32.28 kg/m2

## 2017-06-18 NOTE — Assessment & Plan Note (Signed)
Controlled, no change in medication  

## 2017-06-18 NOTE — Assessment & Plan Note (Signed)
Patient educated about the importance of limiting  Carbohydrate intake , the need to commit to daily physical activity for a minimum of 30 minutes , and to commit weight loss. The fact that changes in all these areas will reduce or eliminate all together the development of diabetes is stressed.   Diabetic Labs Latest Ref Rng & Units 06/14/2017 05/31/2017 07/17/2015 11/15/2014 07/12/2014  HbA1c 4.8 - 5.6 % 7.2(H) - 6.4(A) 6.0(H) 5.9(H)  Microalbumin 0.00 - 1.89 mg/dL - - - - -  Micro/Creat Ratio 0.0 - 30.0 mg/g - - - - -  Chol 100 - 199 mg/dL 200(H) - 196 261(H) 255(H)  HDL >39 mg/dL 38(L) - - 52 42  Calc LDL 0 - 99 mg/dL 143(H) - 132 184(H) 184(H)  Triglycerides 0 - 149 mg/dL 94 - - 125 146  Creatinine 0.44 - 1.00 mg/dL - 1.00 - 0.74 0.83   BP/Weight 06/14/2017 06/03/2017 06/01/2017 05/31/2017 03/21/2017 02/25/2017 12/09/6576  Systolic BP 469 629 528 - 413 244 010  Diastolic BP 64 59 49 - 68 72 74  Wt. (Lbs) 218.5 200 - 200 - 200 215.4  BMI 35.27 32.28 - 32.28 - 32.28 34.77   Foot/eye exam completion dates 07/15/2014  Foot Form Completion Done   Updated lab needed .

## 2017-06-18 NOTE — Assessment & Plan Note (Signed)

## 2017-06-18 NOTE — Assessment & Plan Note (Signed)
Hyperlipidemia:Low fat diet discussed and encouraged.   Lipid Panel  Lab Results  Component Value Date   CHOL 200 (H) 06/14/2017   HDL 38 (L) 06/14/2017   LDLCALC 143 (H) 06/14/2017   TRIG 94 06/14/2017   CHOLHDL 5.3 (H) 06/14/2017   uncontrolled

## 2017-06-18 NOTE — Assessment & Plan Note (Signed)
The increased risk of cardiovascular disease associated with this diagnosis, and the need to consistently work on lifestyle to change this is discussed. Following  a  heart healthy diet ,commitment to 30 minutes of exercise at least 5 days per week, as well as control of blood sugar and cholesterol , and achieving a healthy weight are all the areas to be addressed .  

## 2017-06-18 NOTE — Assessment & Plan Note (Signed)
Controlled, no change in medication DASH diet and commitment to daily physical activity for a minimum of 30 minutes discussed and encouraged, as a part of hypertension management. The importance of attaining a healthy weight is also discussed.  BP/Weight 06/14/2017 06/03/2017 06/01/2017 05/31/2017 03/21/2017 02/25/2017 6/38/7564  Systolic BP 332 951 884 - 166 063 016  Diastolic BP 64 59 49 - 68 72 74  Wt. (Lbs) 218.5 200 - 200 - 200 215.4  BMI 35.27 32.28 - 32.28 - 32.28 34.77

## 2017-06-28 ENCOUNTER — Encounter: Payer: BLUE CROSS/BLUE SHIELD | Admitting: Family Medicine

## 2017-07-01 ENCOUNTER — Telehealth: Payer: Self-pay | Admitting: Family Medicine

## 2017-07-01 ENCOUNTER — Other Ambulatory Visit: Payer: Self-pay | Admitting: Family Medicine

## 2017-07-01 MED ORDER — TRIAMTERENE-HCTZ 37.5-25 MG PO CAPS: 1.0000 | ORAL_CAPSULE | Freq: Every day | ORAL | 1 refills | Status: DC

## 2017-07-01 NOTE — Telephone Encounter (Signed)
Patient would like to know if this Rx has been called in or not.  She states she is completely out   cb  [phone]

## 2017-07-04 ENCOUNTER — Encounter: Payer: BLUE CROSS/BLUE SHIELD | Admitting: Family Medicine

## 2017-07-07 ENCOUNTER — Ambulatory Visit (HOSPITAL_COMMUNITY)
Admission: RE | Admit: 2017-07-07 | Discharge: 2017-07-07 | Disposition: A | Discharge status: Home / Self Care | Payer: BLUE CROSS/BLUE SHIELD | Source: Ambulatory Visit | Attending: Family Medicine | Admitting: Family Medicine

## 2017-07-07 DIAGNOSIS — J984 Other disorders of lung: Secondary | ICD-10-CM | POA: Diagnosis not present

## 2017-07-07 DIAGNOSIS — F17218 Nicotine dependence, cigarettes, with other nicotine-induced disorders: Secondary | ICD-10-CM | POA: Diagnosis present

## 2017-07-07 LAB — PULMONARY FUNCTION TEST
DL/VA % pred: 65 %
DL/VA: 3.32 ml/min/mmHg/L
DLCO unc % pred: 58 %
DLCO unc: 15.69 ml/min/mmHg
FEF 25-75 Post: 2.79 L/sec
FEF 25-75 Pre: 2.6 L/sec
FEF2575-%Change-Post: 7 %
FEF2575-%Pred-Post: 109 %
FEF2575-%Pred-Pre: 101 %
FEV1-%Change-Post: 4 %
FEV1-%Pred-Post: 89 %
FEV1-%Pred-Pre: 86 %
FEV1-Post: 2.51 L
FEV1-Pre: 2.41 L
FEV1FVC-%Change-Post: 0 %
FEV1FVC-%Pred-Pre: 104 %
FEV6-%Change-Post: 5 %
FEV6-%Pred-Post: 89 %
FEV6-%Pred-Pre: 84 %
FEV6-Post: 3.11 L
FEV6-Pre: 2.94 L
FEV6FVC-%Change-Post: 0 %
FEV6FVC-%Pred-Post: 103 %
FEV6FVC-%Pred-Pre: 102 %
FVC-%Change-Post: 4 %
FVC-%Pred-Post: 86 %
FVC-%Pred-Pre: 82 %
FVC-Post: 3.11 L
FVC-Pre: 2.97 L
Post FEV1/FVC ratio: 81 %
Post FEV6/FVC ratio: 100 %
Pre FEV1/FVC ratio: 81 %
Pre FEV6/FVC Ratio: 100 %
RV % pred: 113 %
RV: 2.32 L
TLC % pred: 102 %
TLC: 5.46 L

## 2017-07-07 MED ORDER — ALBUTEROL SULFATE (2.5 MG/3ML) 0.083% IN NEBU
2.5000 mg | INHALATION_SOLUTION | Freq: Once | RESPIRATORY_TRACT | Status: AC
  Administered 2017-07-07: 2.5 mg via RESPIRATORY_TRACT

## 2017-07-22 ENCOUNTER — Other Ambulatory Visit: Payer: Self-pay | Admitting: Family Medicine

## 2017-07-22 DIAGNOSIS — R0902 Hypoxemia: Secondary | ICD-10-CM

## 2017-07-22 DIAGNOSIS — F17218 Nicotine dependence, cigarettes, with other nicotine-induced disorders: Secondary | ICD-10-CM

## 2017-07-22 DIAGNOSIS — R942 Abnormal results of pulmonary function studies: Secondary | ICD-10-CM

## 2017-07-29 ENCOUNTER — Other Ambulatory Visit: Payer: Self-pay | Admitting: Family Medicine

## 2017-08-03 ENCOUNTER — Other Ambulatory Visit: Payer: Self-pay | Admitting: Family Medicine

## 2017-08-09 ENCOUNTER — Telehealth: Payer: Self-pay | Admitting: *Deleted

## 2017-08-09 ENCOUNTER — Other Ambulatory Visit: Payer: Self-pay

## 2017-08-09 MED ORDER — ESOMEPRAZOLE MAGNESIUM 40 MG PO CPDR: 40.0000 mg | DELAYED_RELEASE_CAPSULE | Freq: Every day | ORAL | 5 refills | Status: DC

## 2017-08-09 NOTE — Telephone Encounter (Signed)
Patient called left message stating she needs a refill on her nexium. Please advise

## 2017-08-09 NOTE — Telephone Encounter (Signed)
Refill

## 2017-08-13 ENCOUNTER — Other Ambulatory Visit: Payer: Self-pay | Admitting: Family Medicine

## 2017-08-13 ENCOUNTER — Encounter: Payer: Self-pay | Admitting: Family Medicine

## 2017-08-15 ENCOUNTER — Other Ambulatory Visit: Payer: Self-pay | Admitting: Family Medicine

## 2017-08-15 MED ORDER — TEMAZEPAM 15 MG PO CAPS: 15.0000 mg | ORAL_CAPSULE | Freq: Every evening | ORAL | 0 refills | Status: DC | PRN

## 2017-08-15 MED ORDER — METOPROLOL TARTRATE 37.5 MG PO TABS: 1.0000 | ORAL_TABLET | Freq: Two times a day (BID) | ORAL | 4 refills | Status: DC

## 2017-08-15 NOTE — Progress Notes (Signed)
Metoprolol 

## 2017-08-24 ENCOUNTER — Other Ambulatory Visit: Payer: Self-pay

## 2017-08-24 MED ORDER — VENLAFAXINE HCL ER 75 MG PO CP24: 75.0000 mg | ORAL_CAPSULE | Freq: Every day | ORAL | 1 refills | Status: DC

## 2017-08-24 MED ORDER — ESOMEPRAZOLE MAGNESIUM 40 MG PO CPDR: 40.0000 mg | DELAYED_RELEASE_CAPSULE | Freq: Every day | ORAL | 5 refills | Status: DC

## 2017-08-24 MED ORDER — FENOFIBRIC ACID 135 MG PO CPDR: DELAYED_RELEASE_CAPSULE | ORAL | 1 refills | Status: DC

## 2017-08-24 MED ORDER — VITAMIN D (ERGOCALCIFEROL) 1.25 MG (50000 UNIT) PO CAPS: 50000.0000 [IU] | ORAL_CAPSULE | ORAL | 1 refills | Status: DC

## 2017-08-24 MED ORDER — TEMAZEPAM 15 MG PO CAPS: 15.0000 mg | ORAL_CAPSULE | Freq: Every evening | ORAL | 5 refills | Status: DC | PRN

## 2017-08-24 MED ORDER — METOPROLOL TARTRATE 37.5 MG PO TABS: 1.0000 | ORAL_TABLET | Freq: Two times a day (BID) | ORAL | 1 refills | Status: DC

## 2017-08-24 MED ORDER — TRAZODONE HCL 100 MG PO TABS
100.0000 mg | ORAL_TABLET | Freq: Every day | ORAL | 1 refills | Status: DC
Start: 2017-08-24 — End: 2018-02-02

## 2017-08-24 MED ORDER — LORAZEPAM 1 MG PO TABS: ORAL_TABLET | ORAL | 5 refills | Status: DC

## 2017-08-24 MED ORDER — TRIAMTERENE-HCTZ 37.5-25 MG PO CAPS: 1.0000 | ORAL_CAPSULE | Freq: Every day | ORAL | 1 refills | Status: DC

## 2017-08-24 MED ORDER — NAPROXEN 500 MG PO TABS: 500.0000 mg | ORAL_TABLET | Freq: Two times a day (BID) | ORAL | 2 refills | Status: DC

## 2017-09-07 ENCOUNTER — Telehealth: Payer: Self-pay | Admitting: Family Medicine

## 2017-09-07 NOTE — Telephone Encounter (Signed)
Patient is requesting refill for trazadone, naproxen, and tamazapan [facility] [address]

## 2017-09-07 NOTE — Telephone Encounter (Signed)
Patient aware they were sent to pill pack pharmacy

## 2017-09-09 ENCOUNTER — Other Ambulatory Visit: Payer: Self-pay | Admitting: Family Medicine

## 2017-09-15 ENCOUNTER — Encounter: Payer: Self-pay | Admitting: Family Medicine

## 2017-09-15 ENCOUNTER — Ambulatory Visit: Payer: BLUE CROSS/BLUE SHIELD | Admitting: Family Medicine

## 2017-09-15 VITALS — BP 112/70 | HR 73 | Resp 16 | Ht 66.0 in | Wt 219.0 lb

## 2017-09-15 DIAGNOSIS — F5101 Primary insomnia: Secondary | ICD-10-CM

## 2017-09-15 DIAGNOSIS — E039 Hypothyroidism, unspecified: Secondary | ICD-10-CM

## 2017-09-15 DIAGNOSIS — E669 Obesity, unspecified: Secondary | ICD-10-CM

## 2017-09-15 DIAGNOSIS — E1169 Type 2 diabetes mellitus with other specified complication: Secondary | ICD-10-CM

## 2017-09-15 DIAGNOSIS — I10 Essential (primary) hypertension: Secondary | ICD-10-CM

## 2017-09-15 DIAGNOSIS — E785 Hyperlipidemia, unspecified: Secondary | ICD-10-CM

## 2017-09-15 DIAGNOSIS — F17218 Nicotine dependence, cigarettes, with other nicotine-induced disorders: Secondary | ICD-10-CM

## 2017-09-15 MED ORDER — ESOMEPRAZOLE MAGNESIUM 40 MG PO CPDR: 40.0000 mg | DELAYED_RELEASE_CAPSULE | Freq: Every day | ORAL | 5 refills | Status: DC

## 2017-09-15 MED ORDER — TEMAZEPAM 15 MG PO CAPS: 15.0000 mg | ORAL_CAPSULE | Freq: Every evening | ORAL | 1 refills | Status: DC | PRN

## 2017-09-15 MED ORDER — GLIPIZIDE ER 5 MG PO TB24: 5.0000 mg | ORAL_TABLET | Freq: Every day | ORAL | 1 refills | Status: DC

## 2017-09-15 MED ORDER — BLOOD GLUCOSE METER KIT: PACK | 0 refills | Status: DC

## 2017-09-15 MED ORDER — METOPROLOL TARTRATE 37.5 MG PO TABS: 1.0000 | ORAL_TABLET | Freq: Two times a day (BID) | ORAL | 1 refills | Status: DC

## 2017-09-15 NOTE — Patient Instructions (Addendum)
Rf/u in 3 months, call if you need me before  New is glipizide 5 mg one daily  You are referred for diabetic education, they will call with appt    Fasting lipid, cmp and EGFr and hBA1C in 3 months, 3 days before appt please     Info on diabetes nutrition will be handed to you at checkout also  Need to start testing  Blood sugar every morning, the goal is 80 to 120 before breakfast

## 2017-09-15 NOTE — Progress Notes (Signed)
Annette [name]     MRN: [id number]      DOB: 05-Jul-1961   HPI Annette [name] is here for follow up and re-evaluation of chronic medical conditions, medication management and review of any available recent lab and radiology data.  Preventive health is updated, specifically  Cancer screening and Immunization.   Questions or concerns regarding consultations or procedures which the PT has had in the interim are  Addressed.Has upcoming appt with pulmonary still c/o a lot of foot pain and will continue to see podiatry about this The PT denies any adverse reactions to current medications since the last visit.  C/o excess knee hip and ankle pain bilateral rated at an 8 to 10 on the days that she works,  which involve weight bearing for the entire shift of 12 hrs, at home her pain is 8   ROS Denies recent fever or chills. Denies sinus pressure, nasal congestion, ear pain or sore throat. Denies chest congestion, productive cough or wheezing. Denies chest pains, palpitations and leg swelling Denies abdominal pain, nausea, vomiting,diarrhea or constipation.   Denies dysuria, frequency, hesitancy or incontinence.  Denies headaches, seizures,  Denies depression, c/o stress and  anxiety her  Insomnia. Is adequately treated with medication Denies skin break down or rash.   PE  BP 112/70   Pulse 73   Resp 16   Ht 5\' 6"  (1.676 m)   Wt 219 lb (99.3 kg)   SpO2 94%   BMI 35.35 kg/m   Patient alert and oriented and in no cardiopulmonary distress.  HEENT: No facial asymmetry, EOMI,   oropharynx pink and moist.  Neck supple no JVD, no mass.  Chest: Clear to auscultation bilaterally.Decreaed though adequate air entry  CVS: S1, S2 no murmurs, no S3.Regular rate.  ABD: Soft non tender.   Ext: No edema  MS: Adequate ROM spine, shoulders,  Decreased in hips and knees.  Skin: Intact, no ulcerations or rash noted.  Psych: Good eye contact, normal affect. Memory intact not anxious or depressed  appearing.  CNS: CN 2-12 intact, power,  normal throughout.no focal deficits noted.   Assessment & Plan  HTN, goal below 130/80 Controlled, no change in medication DASH diet and commitment to daily physical activity for a minimum of 30 minutes discussed and encouraged, as a part of hypertension management. The importance of attaining a healthy weight is also discussed.  BP/Weight 09/15/2017 06/14/2017 06/03/2017 06/01/2017 05/31/2017 03/21/2017 8/41/6606  Systolic BP 301 601 093 235 - 573 220  Diastolic BP 70 64 59 49 - 68 72  Wt. (Lbs) 219 218.5 200 - 200 - 200  BMI 35.35 35.27 32.28 - 32.28 - 32.28       Diabetes mellitus type 2 in obese (HCC) Deteriorated , will need to start medication.,she is intolerant of metformin so she will be on glipizide , needs educarion re use of meter as well as carb counting, education started at visit and urgent referral entered  Annette [name] is reminded of the importance of commitment to daily physical activity for 30 minutes or more, as able and the need to limit carbohydrate intake to 30 to 60 grams per meal to help with blood sugar control.   The need to take medication as prescribed, test blood sugar as directed, and to call between visits if there is a concern that blood sugar is uncontrolled is also discussed.   Annette [name] is reminded of the importance of daily foot exam, annual eye examination, and good  blood sugar, blood pressure and cholesterol control.  Diabetic Labs Latest Ref Rng & Units 09/16/2017 06/14/2017 05/31/2017 07/17/2015 11/15/2014  HbA1c 4.8 - 5.6 % - 7.2(H) - 6.4(A) 6.0(H)  Microalbumin Not Estab. ug/mL <3.0(H) - - - -  Micro/Creat Ratio 0.0 - 30.0 mg/g creat <6.0 - - - -  Chol 100 - 199 mg/dL - 200(H) - 196 261(H)  HDL >39 mg/dL - 38(L) - - 52  Calc LDL 0 - 99 mg/dL - 143(H) - 132 184(H)  Triglycerides 0 - 149 mg/dL - 94 - - 125  Creatinine 0.44 - 1.00 mg/dL - - 1.00 - 0.74   BP/Weight 09/15/2017 06/14/2017 06/03/2017  06/01/2017 05/31/2017 03/21/2017 1/76/1607  Systolic BP 371 062 694 854 - 627 035  Diastolic BP 70 64 59 49 - 68 72  Wt. (Lbs) 219 218.5 200 - 200 - 200  BMI 35.35 35.27 32.28 - 32.28 - 32.28   Foot/eye exam completion dates 07/15/2014  Foot Form Completion Done        Hyperlipidemia LDL goal <100 Hyperlipidemia:Low fat diet discussed and encouraged.   Lipid Panel  Lab Results  Component Value Date   CHOL 200 (H) 06/14/2017   HDL 38 (L) 06/14/2017   LDLCALC 143 (H) 06/14/2017   TRIG 94 06/14/2017   CHOLHDL 5.3 (H) 06/14/2017  not at goal, needs to stop cheese and reduce fat foot, she is at increase CV risk and I have made her aware of this     Hypothyroidism Controlled on current medication dose , continue same  Insomnia .cpon1 Sleep hygiene reviewed and written information offered also. Prescription sent for  medication needed.   Morbid obesity Deteriorated. Patient re-educated about  the importance of commitment to a  minimum of 150 minutes of exercise per week.  The importance of healthy food choices with portion control discussed. Encouraged to start a food diary, count calories and to consider  joining a support group. Sample diet sheets offered. Goals set by the patient for the next several months.   Weight /BMI 09/15/2017 06/14/2017 06/03/2017  WEIGHT 219 lb 218 lb 8 oz 200 lb  HEIGHT 5\' 6"  5\' 6"  5\' 6"   BMI 35.35 kg/m2 35.27 kg/m2 32.28 kg/m2      Nicotine dependence Patient is asked and  confirms current  Nicotine use.  Five to seven minutes of time is spent in counseling the patient of the need to quit smoking  Advice to quit is delivered clearly specifically in reducing the risk of developing heart disease, having a stroke, or of developing all types of cancer, especially lung and oral cancer. Improvement in breathing and exercise tolerance and quality of life is also discussed, as is the economic benefit.  Assessment of willingness to quit or to  make an attempt to quit is made and documented  Assistance in quit attempt is made with several and varied options presented, based on patient's desire and need. These include  literature, local classes available, 1800 QUIT NOW number, OTC and prescription medication.  The GOAL to be NICOTINE FREE is re emphasized.  The patient has set a personal goal of either reduction or discontinuation and follow up is arranged between 6 an 16 weeks.

## 2017-09-16 ENCOUNTER — Encounter: Payer: Self-pay | Admitting: Family Medicine

## 2017-09-16 ENCOUNTER — Other Ambulatory Visit (HOSPITAL_COMMUNITY)
Admission: RE | Admit: 2017-09-16 | Discharge: 2017-09-16 | Disposition: A | Discharge status: Home / Self Care | Payer: BLUE CROSS/BLUE SHIELD | Source: Other Acute Inpatient Hospital | Attending: Family Medicine | Admitting: Family Medicine

## 2017-09-16 DIAGNOSIS — R7303 Prediabetes: Secondary | ICD-10-CM | POA: Diagnosis not present

## 2017-09-16 DIAGNOSIS — E1169 Type 2 diabetes mellitus with other specified complication: Secondary | ICD-10-CM

## 2017-09-16 DIAGNOSIS — E669 Obesity, unspecified: Principal | ICD-10-CM

## 2017-09-17 LAB — MICROALBUMIN / CREATININE URINE RATIO
Creatinine, Urine: 49.6 mg/dL
Microalb Creat Ratio: <6 mg/g creat (ref 0.0–30.0)
Microalb, Ur: <3 ug/mL — ABNORMAL HIGH

## 2017-09-18 ENCOUNTER — Encounter: Payer: Self-pay | Admitting: Family Medicine

## 2017-09-18 NOTE — Assessment & Plan Note (Signed)
Controlled on current medication dose , continue same

## 2017-09-18 NOTE — Assessment & Plan Note (Signed)

## 2017-09-18 NOTE — Assessment & Plan Note (Addendum)
Deteriorated , will need to start medication.,she is intolerant of metformin so she will be on glipizide , needs educarion re use of meter as well as carb counting, education started at visit and urgent referral entered  Ms. Junkins is reminded of the importance of commitment to daily physical activity for 30 minutes or more, as able and the need to limit carbohydrate intake to 30 to 60 grams per meal to help with blood sugar control.   The need to take medication as prescribed, test blood sugar as directed, and to call between visits if there is a concern that blood sugar is uncontrolled is also discussed.   Ms. Nerio is reminded of the importance of daily foot exam, annual eye examination, and good blood sugar, blood pressure and cholesterol control.  Diabetic Labs Latest Ref Rng & Units 09/16/2017 06/14/2017 05/31/2017 07/17/2015 11/15/2014  HbA1c 4.8 - 5.6 % - 7.2(H) - 6.4(A) 6.0(H)  Microalbumin Not Estab. ug/mL <3.0(H) - - - -  Micro/Creat Ratio 0.0 - 30.0 mg/g creat <6.0 - - - -  Chol 100 - 199 mg/dL - 200(H) - 196 261(H)  HDL >39 mg/dL - 38(L) - - 52  Calc LDL 0 - 99 mg/dL - 143(H) - 132 184(H)  Triglycerides 0 - 149 mg/dL - 94 - - 125  Creatinine 0.44 - 1.00 mg/dL - - 1.00 - 0.74   BP/Weight 09/15/2017 06/14/2017 06/03/2017 06/01/2017 05/31/2017 03/21/2017 0/60/0459  Systolic BP 977 414 239 532 - 023 343  Diastolic BP 70 64 59 49 - 68 72  Wt. (Lbs) 219 218.5 200 - 200 - 200  BMI 35.35 35.27 32.28 - 32.28 - 32.28   Foot/eye exam completion dates 07/15/2014  Foot Form Completion Done

## 2017-09-18 NOTE — Assessment & Plan Note (Signed)
Hyperlipidemia:Low fat diet discussed and encouraged.   Lipid Panel  Lab Results  Component Value Date   CHOL 200 (H) 06/14/2017   HDL 38 (L) 06/14/2017   LDLCALC 143 (H) 06/14/2017   TRIG 94 06/14/2017   CHOLHDL 5.3 (H) 06/14/2017  not at goal, needs to stop cheese and reduce fat foot, she is at increase CV risk and I have made her aware of this

## 2017-09-18 NOTE — Assessment & Plan Note (Signed)
Deteriorated. Patient re-educated about  the importance of commitment to a  minimum of 150 minutes of exercise per week.  The importance of healthy food choices with portion control discussed. Encouraged to start a food diary, count calories and to consider  joining a support group. Sample diet sheets offered. Goals set by the patient for the next several months.   Weight /BMI 09/15/2017 06/14/2017 06/03/2017  WEIGHT 219 lb 218 lb 8 oz 200 lb  HEIGHT 5\' 6"  5\' 6"  5\' 6"   BMI 35.35 kg/m2 35.27 kg/m2 32.28 kg/m2

## 2017-09-18 NOTE — Assessment & Plan Note (Signed)
.  cpon1 Sleep hygiene reviewed and written information offered also. Prescription sent for  medication needed.

## 2017-09-18 NOTE — Assessment & Plan Note (Signed)
Controlled, no change in medication DASH diet and commitment to daily physical activity for a minimum of 30 minutes discussed and encouraged, as a part of hypertension management. The importance of attaining a healthy weight is also discussed.  BP/Weight 09/15/2017 06/14/2017 06/03/2017 06/01/2017 05/31/2017 03/21/2017 3/89/3734  Systolic BP 287 681 157 262 - 035 597  Diastolic BP 70 64 59 49 - 68 72  Wt. (Lbs) 219 218.5 200 - 200 - 200  BMI 35.35 35.27 32.28 - 32.28 - 32.28

## 2017-09-19 MED ORDER — GLIPIZIDE ER 5 MG PO TB24: 5.0000 mg | ORAL_TABLET | Freq: Every day | ORAL | 0 refills | Status: DC

## 2017-09-19 NOTE — Telephone Encounter (Signed)
Seen 12 13 18 

## 2017-09-28 ENCOUNTER — Telehealth: Payer: Self-pay

## 2017-09-28 DIAGNOSIS — E669 Obesity, unspecified: Principal | ICD-10-CM

## 2017-09-28 DIAGNOSIS — E1169 Type 2 diabetes mellitus with other specified complication: Secondary | ICD-10-CM

## 2017-09-28 MED ORDER — GLIPIZIDE ER 5 MG PO TB24: 5.0000 mg | ORAL_TABLET | Freq: Every day | ORAL | 0 refills | Status: DC

## 2017-09-28 NOTE — Telephone Encounter (Signed)
Done

## 2017-09-28 NOTE — Telephone Encounter (Signed)
Pt went out of town and left her Glipizide 5mg  at her Son's house.  Can you give her 2 weeks worth to last her till she can get her other med back.  Please call into [facility] [address]

## 2017-10-12 ENCOUNTER — Other Ambulatory Visit: Payer: Self-pay | Admitting: Family Medicine

## 2017-10-14 ENCOUNTER — Telehealth: Payer: Self-pay | Admitting: Family Medicine

## 2017-10-14 NOTE — Telephone Encounter (Signed)
From [facility] left message on nurse line regarding patient's Temazepam. They are asking for a prescription dosage change. They are asking to remove the 'PRN' from the prescription and get a 90 supply.  Phone number [phone]; option 3 for providers.  Fax [phone]

## 2017-10-17 ENCOUNTER — Other Ambulatory Visit: Payer: Self-pay

## 2017-10-17 DIAGNOSIS — F5101 Primary insomnia: Secondary | ICD-10-CM

## 2017-10-17 MED ORDER — TEMAZEPAM 15 MG PO CAPS: ORAL_CAPSULE | ORAL | 1 refills | Status: DC

## 2017-10-17 NOTE — Telephone Encounter (Signed)
Removed as needed from the rx and resent

## 2017-10-18 ENCOUNTER — Other Ambulatory Visit: Payer: Self-pay

## 2017-10-18 DIAGNOSIS — E1169 Type 2 diabetes mellitus with other specified complication: Secondary | ICD-10-CM

## 2017-10-18 DIAGNOSIS — E669 Obesity, unspecified: Principal | ICD-10-CM

## 2017-10-25 ENCOUNTER — Other Ambulatory Visit: Payer: Self-pay | Admitting: Family Medicine

## 2017-10-25 DIAGNOSIS — E669 Obesity, unspecified: Principal | ICD-10-CM

## 2017-10-25 DIAGNOSIS — E1169 Type 2 diabetes mellitus with other specified complication: Secondary | ICD-10-CM

## 2017-10-27 ENCOUNTER — Other Ambulatory Visit: Payer: Self-pay | Admitting: Family Medicine

## 2017-10-31 ENCOUNTER — Other Ambulatory Visit: Payer: Self-pay | Admitting: Family Medicine

## 2017-11-10 ENCOUNTER — Encounter: Payer: Self-pay | Admitting: Nutrition

## 2017-11-10 ENCOUNTER — Encounter: Payer: BLUE CROSS/BLUE SHIELD | Attending: Family Medicine | Admitting: Nutrition

## 2017-11-10 VITALS — Wt 221.0 lb

## 2017-11-10 DIAGNOSIS — E1165 Type 2 diabetes mellitus with hyperglycemia: Secondary | ICD-10-CM

## 2017-11-10 DIAGNOSIS — E669 Obesity, unspecified: Secondary | ICD-10-CM

## 2017-11-10 DIAGNOSIS — E118 Type 2 diabetes mellitus with unspecified complications: Principal | ICD-10-CM

## 2017-11-10 DIAGNOSIS — IMO0002 Reserved for concepts with insufficient information to code with codable children: Secondary | ICD-10-CM

## 2017-11-10 NOTE — Patient Instructions (Addendum)
Goals 1.Follow My Plate 2. Eat 2-3 carb choice three times per day 3. Exercise 30 minutes 2-3 times per week. 4. Increase fresh fruits and vegetables 5. Keep unsweet tea.  Get A1C down to 6%

## 2017-11-10 NOTE — Progress Notes (Signed)
  Medical Nutrition Therapy:  Appt start time: 1430 end time:  1600 Assessment:  Primary concerns today: Diabetes Type 2.. PMH: Thyroid issues, Hypertriglyceridemia, HTN, Depression, Vit D Deficiency, Sleep Apnea, IBS, Hypothryoid, H/o  Breast cancer. Currently on Glipizide. Couldn't tolerate Metformin. 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 Most recent  A1C was 7.2%. # yrs ago it was 6%.  BS are 130-140's when she tests occasionally. Doesn't understand how her A1C is elevated and she has DM. Gained 18 lbs in the last three months. Se is also on Effexor, Trazadone which can contribute to increase appetite, increased thirst and weight gain. She is here with her husband. Does eat 1 meal per day. Drinks flavored coffee.   Doesn't exercise outside of her job. Current diet is inconsistent to meet her needs for blood sugars, weight, and triglycerides.  BS was 121 mg/dl this am  with coffee and flavored creamer.   Lab Results  Component Value Date   HGBA1C 7.2 (H) 06/14/2017    \Preferred Learning Style:   Auditory  Visual  Hands on  Learning Readiness:   Ready  Change in progress   MEDICATIONS: See list   DIETARY INTAKE:    24-hr recall:  B (  9 AM): B) 2 egg and sausage omelet, hash browns, cheese, 2 slices toast with black berry jam  And Coffee carmel creamer Snk ( AM):   L ( PM): Sleep til 5 pm.  5:30 COffee with creamer 7-8 pm: 2 cups. Macaroni, sausage, hamburger and mozerrella cheese and 1 1/2 piece of garlic toast. Diet Sunkist PB twix.    Beverages: Dt soda, unsweet tea or 1-2 cups coffee Usual physical activity:  ADL   Estimated energy needs: 1500  calories 170  g carbohydrates 112 g protein  42 g fat  Progress Towards Goal(s):  In progress.   Nutritional Diagnosis:  NB-1.1 Food and nutrition-related knowledge deficit As related to Diabetes.  As evidenced by A1C 7.2%..    Intervention:  Nutrition and Diabetes education provided on My Plate, CHO counting,  meal planning, portion sizes, timing of meals, avoiding snacks between meals unless having a low blood sugar, target ranges for A1C and blood sugars, signs/symptoms and treatment of hyper/hypoglycemia, monitoring blood sugars, taking medications as prescribed, benefits of exercising 30 minutes per day and prevention of complications of DM.    Goals 1.Follow My Plate 2. Eat 2-3 carb choice three times per day 3. Exercise 30 minutes 2-3 times per week. 4. Increase fresh fruits and vegetables 5. Keep unsweet tea.  Get A1C down to 6%  Teaching Method Utilized:  Visual Auditory Hands on  Handouts given during visit include:  The Plate Method   Meal Plan Card  Diabetes Instructions.   Barriers to learning/adherence to lifestyle change: none  Demonstrated degree of understanding via:  Teach Back   Monitoring/Evaluation:  Dietary intake, exercise, meal planning, SBG, and body weight in 1 month(s).

## 2017-12-08 ENCOUNTER — Other Ambulatory Visit: Payer: Self-pay | Admitting: Family Medicine

## 2017-12-15 ENCOUNTER — Ambulatory Visit: Payer: BLUE CROSS/BLUE SHIELD | Admitting: Family Medicine

## 2017-12-20 ENCOUNTER — Telehealth: Payer: Self-pay | Admitting: Family Medicine

## 2017-12-20 NOTE — Telephone Encounter (Signed)
Sharyn Lull is calling from [facility],  Ph # [phone] Opt 3 Fax [phone]  Pt needs Glipizide 5mg  Frees style Lancets and test strips 28g needles esomeprazole

## 2017-12-20 NOTE — Telephone Encounter (Signed)
The last I heard the patient uses [facility]. Pt needs to call and change her pharmacy

## 2017-12-27 ENCOUNTER — Encounter: Payer: BLUE CROSS/BLUE SHIELD | Attending: Family Medicine | Admitting: Nutrition

## 2017-12-27 VITALS — Ht 66.0 in | Wt 217.0 lb

## 2017-12-27 DIAGNOSIS — E669 Obesity, unspecified: Secondary | ICD-10-CM

## 2017-12-27 DIAGNOSIS — E118 Type 2 diabetes mellitus with unspecified complications: Principal | ICD-10-CM

## 2017-12-27 DIAGNOSIS — IMO0002 Reserved for concepts with insufficient information to code with codable children: Secondary | ICD-10-CM

## 2017-12-27 DIAGNOSIS — E1165 Type 2 diabetes mellitus with hyperglycemia: Secondary | ICD-10-CM

## 2017-12-27 NOTE — Progress Notes (Signed)
  Medical Nutrition Therapy:  Appt start time: 1530  end time:  1600 Assessment:  Primary concerns today: Diabetes Type 2..   5 mg GLipizide. FBS 78-130 mg/dl. Lost 4 lbs. Has been drinking unsweet tea.    PMH: Thyroid issues, Hypertriglyceridemia, HTN, Depression, Vit D Deficiency, Sleep Apnea, IBS, Hypothryoid, H/o  Breast cancer. Currently on Glipizide. Couldn't tolerate Metformin. 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 Most recent  A1C was 7.2%. # yrs ago it was 6%.  BS are 130-140's when she tests occasionally. Doesn't understand how her A1C is elevated and she has DM. Gained 18 lbs in the last three months. Se is also on Effexor, Trazadone which can contribute to increase appetite, increased thirst and weight gain. She is here with her husband. Does eat 1 meal per day. Drinks flavored coffee.   Doesn't exercise outside of her job. Current diet is inconsistent to meet her needs for blood sugars, weight, and triglycerides.  BS was 121 mg/dl this am  with coffee and flavored creamer.   Lab Results  Component Value Date   HGBA1C 7.2 (H) 06/14/2017    \Preferred Learning Style:   Auditory  Visual  Hands on  Learning Readiness:   Ready  Change in progress   MEDICATIONS: See list   DIETARY INTAKE:    24-hr recall:  B (  9 AM): B) Egg mcmuffin, Snk ( AM):   L ( PM): Sleep til 5 pm.  5:30 COffee with creamer 7-8 pm: 2 cups. Macaroni, sausage, hamburger and mozerrella cheese and 1 1/2 piece of garlic toast. Diet Sunkist PB twix.    Beverages: Dt soda, unsweet tea or 1-2 cups coffee Usual physical activity:  ADL   Estimated energy needs: 1500  calories 170  g carbohydrates 112 g protein  42 g fat  Progress Towards Goal(s):  In progress.   Nutritional Diagnosis:  NB-1.1 Food and nutrition-related knowledge deficit As related to Diabetes.  As evidenced by A1C 7.2%..    Intervention:  Nutrition and Diabetes education provided on My Plate, CHO counting, meal  planning, portion sizes, timing of meals, avoiding snacks between meals unless having a low blood sugar, target ranges for A1C and blood sugars, signs/symptoms and treatment of hyper/hypoglycemia, monitoring blood sugars, taking medications as prescribed, benefits of exercising 30 minutes per day and prevention of complications of DM.    Goals 1.Follow My Plate 2. Eat 2-3 carb choice three times per day 3. Exercise 30 minutes 2-3 times per week. 4. Increase fresh fruits and vegetables 5. Keep unsweet tea.  Get A1C down to 6%  Teaching Method Utilized:  Visual Auditory Hands on  Handouts given during visit include:  The Plate Method   Meal Plan Card  Diabetes Instructions.   Barriers to learning/adherence to lifestyle change: none  Demonstrated degree of understanding via:  Teach Back   Monitoring/Evaluation:  Dietary intake, exercise, meal planning, SBG, and body weight in 1 month(s).

## 2017-12-27 NOTE — Patient Instructions (Addendum)
Goals 1. Take Glipzide dail before you go to work.. 2..  Don't skip meals 3. Cut down on smoking to 1/2 cigarrette Get A1C to 6% or less.

## 2017-12-30 ENCOUNTER — Other Ambulatory Visit: Payer: Self-pay | Admitting: Family Medicine

## 2018-01-11 ENCOUNTER — Other Ambulatory Visit: Payer: Self-pay | Admitting: Family Medicine

## 2018-01-11 DIAGNOSIS — E669 Obesity, unspecified: Principal | ICD-10-CM

## 2018-01-11 DIAGNOSIS — E1169 Type 2 diabetes mellitus with other specified complication: Secondary | ICD-10-CM

## 2018-01-19 ENCOUNTER — Other Ambulatory Visit: Payer: Self-pay | Admitting: Family Medicine

## 2018-01-19 ENCOUNTER — Telehealth: Payer: Self-pay

## 2018-01-19 DIAGNOSIS — E669 Obesity, unspecified: Principal | ICD-10-CM

## 2018-01-19 DIAGNOSIS — E1169 Type 2 diabetes mellitus with other specified complication: Secondary | ICD-10-CM

## 2018-01-19 DIAGNOSIS — E785 Hyperlipidemia, unspecified: Secondary | ICD-10-CM

## 2018-01-19 NOTE — Telephone Encounter (Signed)
Labs ordered.

## 2018-01-20 ENCOUNTER — Encounter: Payer: Self-pay | Admitting: Family Medicine

## 2018-01-20 LAB — CMP14+EGFR
ALT: 17 IU/L (ref 0–32)
AST: 13 IU/L (ref 0–40)
Albumin/Globulin Ratio: 1.6 (ref 1.2–2.2)
Albumin: 4.3 g/dL (ref 3.5–5.5)
Alkaline Phosphatase: 78 IU/L (ref 39–117)
BUN/Creatinine Ratio: 16 (ref 9–23)
BUN: 17 mg/dL (ref 6–24)
Bilirubin Total: 0.4 mg/dL (ref 0.0–1.2)
CO2: 24 mmol/L (ref 20–29)
Calcium: 9.8 mg/dL (ref 8.7–10.2)
Chloride: 105 mmol/L (ref 96–106)
Creatinine, Ser: 1.07 mg/dL — ABNORMAL HIGH (ref 0.57–1.00)
GFR calc Af Amer: 66 mL/min/{1.73_m2} (ref >59)
GFR calc non Af Amer: 57 mL/min/{1.73_m2} — ABNORMAL LOW (ref >59)
Globulin, Total: 2.7 g/dL (ref 1.5–4.5)
Glucose: 156 mg/dL — ABNORMAL HIGH (ref 65–99)
Potassium: 4.3 mmol/L (ref 3.5–5.2)
Sodium: 144 mmol/L (ref 134–144)
Total Protein: 7 g/dL (ref 6.0–8.5)

## 2018-01-20 LAB — LIPID PANEL
Chol/HDL Ratio: 5.7 ratio — ABNORMAL HIGH (ref 0.0–4.4)
Cholesterol, Total: 218 mg/dL — ABNORMAL HIGH (ref 100–199)
HDL: 38 mg/dL — ABNORMAL LOW (ref >39)
LDL Calculated: 158 mg/dL — ABNORMAL HIGH (ref 0–99)
Triglycerides: 112 mg/dL (ref 0–149)
VLDL Cholesterol Cal: 22 mg/dL (ref 5–40)

## 2018-01-20 LAB — HEMOGLOBIN A1C
Est. average glucose Bld gHb Est-mCnc: 151 mg/dL
Hgb A1c MFr Bld: 6.9 % — ABNORMAL HIGH (ref 4.8–5.6)

## 2018-01-24 ENCOUNTER — Encounter: Payer: Self-pay | Admitting: Family Medicine

## 2018-01-24 ENCOUNTER — Ambulatory Visit: Payer: BLUE CROSS/BLUE SHIELD | Admitting: Family Medicine

## 2018-01-24 VITALS — BP 118/70 | HR 81 | Resp 16 | Ht 66.0 in | Wt 221.0 lb

## 2018-01-24 DIAGNOSIS — Z9189 Other specified personal risk factors, not elsewhere classified: Secondary | ICD-10-CM | POA: Diagnosis not present

## 2018-01-24 DIAGNOSIS — F17218 Nicotine dependence, cigarettes, with other nicotine-induced disorders: Secondary | ICD-10-CM

## 2018-01-24 DIAGNOSIS — F5101 Primary insomnia: Secondary | ICD-10-CM

## 2018-01-24 DIAGNOSIS — R1084 Generalized abdominal pain: Secondary | ICD-10-CM | POA: Diagnosis not present

## 2018-01-24 DIAGNOSIS — E785 Hyperlipidemia, unspecified: Secondary | ICD-10-CM

## 2018-01-24 DIAGNOSIS — I10 Essential (primary) hypertension: Secondary | ICD-10-CM

## 2018-01-24 DIAGNOSIS — E669 Obesity, unspecified: Secondary | ICD-10-CM

## 2018-01-24 DIAGNOSIS — E1169 Type 2 diabetes mellitus with other specified complication: Secondary | ICD-10-CM

## 2018-01-24 DIAGNOSIS — K582 Mixed irritable bowel syndrome: Secondary | ICD-10-CM | POA: Diagnosis not present

## 2018-01-24 MED ORDER — ROSUVASTATIN CALCIUM 20 MG PO TABS: 20.0000 mg | ORAL_TABLET | Freq: Every day | ORAL | 3 refills | Status: DC

## 2018-01-24 NOTE — Progress Notes (Signed)
Annette [name]     MRN: [id number]      DOB: 01-01-1961   HPI Annette [name] is here for follow up and re-evaluation of chronic medical conditions, medication management and review of any available recent lab and radiology data.  Preventive health is updated, specifically  Cancer screening and Immunization.    The PT denies any adverse reactions to current medications since the last visit. 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 Does have IBS, alternating constipation with diarrhea, which she can manage but  Not the bloating. Has concerns about her hiatal hernia, back pain and bloating  Wants to see GI about this, states the pain is so severe that she is unsure about her ability to work Has blood sugar log which is good overall. When hse has high values , this is related to eating loaded salads as her spouse points out. Her lipids are very high with increased CV risk and she does have a very strong f/h of heart disease. He is imploring her to try statin therapy as well as he is interested in her seeing his cardiologist. She is trying to stop smoking but says that as long as she continues o work , this is not possible She has no regular exercise routine , and does report fatigue  ROS Denies recent fever or chills. Denies sinus pressure, nasal congestion, ear pain or sore throat. Denies chest congestion, productive cough or wheezing. Denies chest pains, palpitations and leg swelling Denies abdominal pain, nausea, vomiting,diarrhea or cionstipation.   Denies dysuria, frequency, hesitancy or incontinence.  Denies headaches, seizures, numbness, or tingling. Denies uncontrolled  depression, anxiety or insomnia. Denies skin break down or rash.   PE  BP 118/70   Pulse 81   Resp 16   Ht 5\' 6"  (1.676 m)   Wt 221 lb (100.2 kg)   SpO2 96%   BMI 35.67 kg/m    Patient alert and oriented and in no cardiopulmonary distress.  HEENT: No facial asymmetry, EOMI,   oropharynx pink and moist.  Neck supple no JVD, no mass.  Chest: Clear to auscultation bilaterally.  CVS: S1, S2 no murmurs, no S3.Regular rate.  ABD: Soft non tender.   Ext: No edema  MS: Adequate ROM spine, shoulders, hips and knees.  Skin: Intact, no ulcerations or rash noted.  Psych: Good eye contact, normal affect. Memory intact not anxious or depressed appearing.  CNS: CN 2-12 intact, power,  normal throughout.no focal deficits noted.   Assessment & Plan HTN, goal below 130/80 Controlled, no change in medication DASH diet and commitment to daily physical activity for a minimum of 30 minutes discussed and encouraged, as a part of hypertension management. The importance of attaining a healthy weight is also discussed.  BP/Weight 01/24/2018 12/27/2017 11/10/2017 09/15/2017 06/14/2017 06/03/2017 5/80/9983  Systolic BP 382 - - 505 397 673 419  Diastolic BP 70 - - 70 64 59 49  Wt. (Lbs) 221 217 221 219 218.5 200 -  BMI 35.67 35.02 35.67 35.35 35.27 32.28 -       Diabetes mellitus type 2 in obese (HCC) Controlled, no change in medication Annette [name] is reminded of the importance of commitment to daily physical activity for 30 minutes or more, as able and the need to limit carbohydrate intake to 30 to 60  grams per meal to help with blood sugar control.   The need to take medication as prescribed, test blood sugar as directed, and to call between visits if there is a concern that blood sugar is uncontrolled is also discussed.   Annette [name] is reminded of the importance of daily foot exam, annual eye examination, and good blood sugar, blood pressure and cholesterol control.  Diabetic Labs Latest Ref Rng & Units 01/19/2018 09/16/2017 06/14/2017 05/31/2017 07/17/2015  HbA1c 4.8 - 5.6 % 6.9(H) - 7.2(H) - 6.4(A)  Microalbumin Not Estab. ug/mL - <3.0(H) - - -  Micro/Creat Ratio 0.0  - 30.0 mg/g creat - <6.0 - - -  Chol 100 - 199 mg/dL 218(H) - 200(H) - 196  HDL >39 mg/dL 38(L) - 38(L) - -  Calc LDL 0 - 99 mg/dL 158(H) - 143(H) - 132  Triglycerides 0 - 149 mg/dL 112 - 94 - -  Creatinine 0.57 - 1.00 mg/dL 1.07(H) - - 1.00 -   BP/Weight 01/24/2018 12/27/2017 11/10/2017 09/15/2017 06/14/2017 06/03/2017 2/54/2706  Systolic BP 237 - - 628 315 176 160  Diastolic BP 70 - - 70 64 59 49  Wt. (Lbs) 221 217 221 219 218.5 200 -  BMI 35.67 35.02 35.67 35.35 35.27 32.28 -   Foot/eye exam completion dates 07/15/2014  Foot Form Completion Done        Hyperlipidemia LDL goal <100 Deteriorated. Start crestor, needs to stop fenofibrate , will need to contact patient Hyperlipidemia:Low fat diet discussed and encouraged.   Lipid Panel  Lab Results  Component Value Date   CHOL 218 (H) 01/19/2018   HDL 38 (L) 01/19/2018   LDLCALC 158 (H) 01/19/2018   TRIG 112 01/19/2018   CHOLHDL 5.7 (H) 01/19/2018   Refer for cardiology eval due to high risk of heart disease based on personal and f/h history    Hypothyroidism    Morbid obesity Deteriorated. Patient re-educated about  the importance of commitment to a  minimum of 150 minutes of exercise per week.  The importance of healthy food choices with portion control discussed. Encouraged to start a food diary, count calories and to consider  joining a support group. Sample diet sheets offered. Goals set by the patient for the next several months.   Weight /BMI 01/24/2018 12/27/2017 11/10/2017  WEIGHT 221 lb 217 lb 221 lb  HEIGHT 5\' 6"  5\' 6"  -  BMI 35.67 kg/m2 35.02 kg/m2 35.67 kg/m2      Nicotine dependence Asked: confirms currently smokes cigarettes, less, down to approx 6  Per day Assess; wants to quit but unable to set date, trying Advised: need to quit due to high risk of CAD,based on personal risk factors and family history , also risk of COPD and all types of cancer , already has had breast cancer,a nd also  stroke Assist: 100 QUITNOW # provided and encouraged use, also encouraged to look at  community resources, counseled for 5 minutes  Arrange : f/u in 3.5 months  Insomnia Controlled on current medication continue same, sleep hygiene is reviewed  At risk for cardiovascular event Pt with multiple CV risk factors, nicotine use, diabetes, uncontrolled hyperlipidemia, f/h of CAD, morbid obesity  And hyperenson , will refer for cardiology assesment spouse requests his cardiologist   Abdominal pain abdominal pain and bloating from uncontrolled IBS, pt requests GI evaluation as she is finding this increasingly disabling especially on he job when she is experiencing back pain, will refer

## 2018-01-24 NOTE — Patient Instructions (Addendum)
F/u in 4 month, call if you need me before  Non fasting hepatic panel in first week in June   Fasting lipid, cmp and EGFr, hBA1C 1 week before MD visit in 4 months  New additional medication for cholesterol is crestor ( gebneric0 20 mg one at bedtime , please also cut back on fat in diet  Congrats on improved blood sugar  You are referred to Dr Rehman/ nP Coralyn Mark re bloating symptom and iBS  Please reschedule mammogram to July 28 or after  Thank you  for choosing [facility]. We consider it a privelige to serve you.  Delivering excellent health care in a caring and  compassionate way is our goal.  Partnering with you,  so that together we can achieve this goal is our strategy.

## 2018-01-30 ENCOUNTER — Encounter: Payer: Self-pay | Admitting: Family Medicine

## 2018-01-30 DIAGNOSIS — Z9189 Other specified personal risk factors, not elsewhere classified: Secondary | ICD-10-CM | POA: Insufficient documentation

## 2018-01-30 DIAGNOSIS — R109 Unspecified abdominal pain: Secondary | ICD-10-CM | POA: Insufficient documentation

## 2018-01-30 NOTE — Assessment & Plan Note (Signed)
abdominal pain and bloating from uncontrolled IBS, pt requests GI evaluation as she is finding this increasingly disabling especially on he job when she is experiencing back pain, will refer

## 2018-01-30 NOTE — Assessment & Plan Note (Signed)
Controlled, no change in medication Annette [name] is reminded of the importance of commitment to daily physical activity for 30 minutes or more, as able and the need to limit carbohydrate intake to 30 to 60 grams per meal to help with blood sugar control.   The need to take medication as prescribed, test blood sugar as directed, and to call between visits if there is a concern that blood sugar is uncontrolled is also discussed.   Annette [name] is reminded of the importance of daily foot exam, annual eye examination, and good blood sugar, blood pressure and cholesterol control.  Diabetic Labs Latest Ref Rng & Units 01/19/2018 09/16/2017 06/14/2017 05/31/2017 07/17/2015  HbA1c 4.8 - 5.6 % 6.9(H) - 7.2(H) - 6.4(A)  Microalbumin Not Estab. ug/mL - <3.0(H) - - -  Micro/Creat Ratio 0.0 - 30.0 mg/g creat - <6.0 - - -  Chol 100 - 199 mg/dL 218(H) - 200(H) - 196  HDL >39 mg/dL 38(L) - 38(L) - -  Calc LDL 0 - 99 mg/dL 158(H) - 143(H) - 132  Triglycerides 0 - 149 mg/dL 112 - 94 - -  Creatinine 0.57 - 1.00 mg/dL 1.07(H) - - 1.00 -   BP/Weight 01/24/2018 12/27/2017 11/10/2017 09/15/2017 06/14/2017 06/03/2017 1/61/0960  Systolic BP 454 - - 098 119 147 829  Diastolic BP 70 - - 70 64 59 49  Wt. (Lbs) 221 217 221 219 218.5 200 -  BMI 35.67 35.02 35.67 35.35 35.27 32.28 -   Foot/eye exam completion dates 07/15/2014  Foot Form Completion Done

## 2018-01-30 NOTE — Assessment & Plan Note (Signed)
Controlled, no change in medication DASH diet and commitment to daily physical activity for a minimum of 30 minutes discussed and encouraged, as a part of hypertension management. The importance of attaining a healthy weight is also discussed.  BP/Weight 01/24/2018 12/27/2017 11/10/2017 09/15/2017 06/14/2017 06/03/2017 04/24/5749  Systolic BP 518 - - 335 825 189 842  Diastolic BP 70 - - 70 64 59 49  Wt. (Lbs) 221 217 221 219 218.5 200 -  BMI 35.67 35.02 35.67 35.35 35.27 32.28 -

## 2018-01-30 NOTE — Assessment & Plan Note (Signed)
Deteriorated. Start crestor, needs to stop fenofibrate , will need to contact patient Hyperlipidemia:Low fat diet discussed and encouraged.   Lipid Panel  Lab Results  Component Value Date   CHOL 218 (H) 01/19/2018   HDL 38 (L) 01/19/2018   LDLCALC 158 (H) 01/19/2018   TRIG 112 01/19/2018   CHOLHDL 5.7 (H) 01/19/2018   Refer for cardiology eval due to high risk of heart disease based on personal and f/h history

## 2018-01-30 NOTE — Assessment & Plan Note (Signed)
Deteriorated. Patient re-educated about  the importance of commitment to a  minimum of 150 minutes of exercise per week.  The importance of healthy food choices with portion control discussed. Encouraged to start a food diary, count calories and to consider  joining a support group. Sample diet sheets offered. Goals set by the patient for the next several months.   Weight /BMI 01/24/2018 12/27/2017 11/10/2017  WEIGHT 221 lb 217 lb 221 lb  HEIGHT 5\' 6"  5\' 6"  -  BMI 35.67 kg/m2 35.02 kg/m2 35.67 kg/m2

## 2018-01-30 NOTE — Assessment & Plan Note (Signed)
Controlled on current medication continue same, sleep hygiene is reviewed

## 2018-01-30 NOTE — Assessment & Plan Note (Signed)
Pt with multiple CV risk factors, nicotine use, diabetes, uncontrolled hyperlipidemia, f/h of CAD, morbid obesity  And hyperenson , will refer for cardiology assesment spouse requests his cardiologist

## 2018-01-30 NOTE — Assessment & Plan Note (Signed)
Asked: confirms currently smokes cigarettes, less, down to approx 6  Per day Assess; wants to quit but unable to set date, trying Advised: need to quit due to high risk of CAD,based on personal risk factors and family history , also risk of COPD and all types of cancer , already has had breast cancer,a nd also stroke Assist: 100 QUITNOW # provided and encouraged use, also encouraged to look at  community resources, counseled for 5 minutes  Arrange : f/u in 3.5 months

## 2018-01-31 ENCOUNTER — Encounter: Payer: Self-pay | Admitting: Family Medicine

## 2018-02-01 ENCOUNTER — Other Ambulatory Visit: Payer: Self-pay

## 2018-02-01 DIAGNOSIS — E669 Obesity, unspecified: Principal | ICD-10-CM

## 2018-02-01 DIAGNOSIS — E1169 Type 2 diabetes mellitus with other specified complication: Secondary | ICD-10-CM

## 2018-02-01 MED ORDER — LORAZEPAM 1 MG PO TABS: ORAL_TABLET | ORAL | 5 refills | Status: DC

## 2018-02-01 MED ORDER — GLIPIZIDE ER 5 MG PO TB24: ORAL_TABLET | ORAL | 1 refills | Status: DC

## 2018-02-02 ENCOUNTER — Telehealth: Payer: Self-pay | Admitting: Family Medicine

## 2018-02-02 ENCOUNTER — Other Ambulatory Visit: Payer: Self-pay

## 2018-02-02 DIAGNOSIS — E785 Hyperlipidemia, unspecified: Secondary | ICD-10-CM

## 2018-02-02 MED ORDER — ESOMEPRAZOLE MAGNESIUM 40 MG PO CPDR: 40.0000 mg | DELAYED_RELEASE_CAPSULE | Freq: Every day | ORAL | 3 refills | Status: DC

## 2018-02-02 MED ORDER — TRIAMTERENE-HCTZ 37.5-25 MG PO CAPS: 1.0000 | ORAL_CAPSULE | Freq: Every day | ORAL | 3 refills | Status: DC

## 2018-02-02 MED ORDER — TRAZODONE HCL 100 MG PO TABS: 100.0000 mg | ORAL_TABLET | Freq: Every day | ORAL | 3 refills | Status: DC

## 2018-02-02 MED ORDER — NAPROXEN 500 MG PO TABS: 500.0000 mg | ORAL_TABLET | Freq: Two times a day (BID) | ORAL | 2 refills | Status: DC

## 2018-02-02 MED ORDER — VENLAFAXINE HCL ER 75 MG PO CP24: 75.0000 mg | ORAL_CAPSULE | Freq: Every day | ORAL | 3 refills | Status: DC

## 2018-02-02 MED ORDER — ROSUVASTATIN CALCIUM 20 MG PO TABS: 20.0000 mg | ORAL_TABLET | Freq: Every day | ORAL | 3 refills | Status: DC

## 2018-02-02 MED ORDER — METOPROLOL TARTRATE 37.5 MG PO TABS: 1.0000 | ORAL_TABLET | Freq: Two times a day (BID) | ORAL | 3 refills | Status: DC

## 2018-02-02 NOTE — Telephone Encounter (Signed)
done

## 2018-02-02 NOTE — Telephone Encounter (Signed)
Please put in lab order  To [facility]

## 2018-02-03 ENCOUNTER — Other Ambulatory Visit: Payer: Self-pay | Admitting: Family Medicine

## 2018-02-06 ENCOUNTER — Other Ambulatory Visit: Payer: Self-pay

## 2018-02-06 MED ORDER — ESOMEPRAZOLE MAGNESIUM 40 MG PO CPDR: 40.0000 mg | DELAYED_RELEASE_CAPSULE | Freq: Every day | ORAL | 3 refills | Status: DC

## 2018-02-09 ENCOUNTER — Ambulatory Visit (INDEPENDENT_AMBULATORY_CARE_PROVIDER_SITE_OTHER): Payer: BLUE CROSS/BLUE SHIELD | Admitting: Internal Medicine

## 2018-02-15 ENCOUNTER — Encounter: Payer: Self-pay | Admitting: Cardiovascular Disease

## 2018-02-16 ENCOUNTER — Encounter (INDEPENDENT_AMBULATORY_CARE_PROVIDER_SITE_OTHER): Payer: Self-pay | Admitting: Internal Medicine

## 2018-02-16 ENCOUNTER — Ambulatory Visit (INDEPENDENT_AMBULATORY_CARE_PROVIDER_SITE_OTHER): Payer: BLUE CROSS/BLUE SHIELD | Admitting: Internal Medicine

## 2018-02-16 VITALS — BP 104/62 | HR 60 | Temp 98.6°F | Ht 67.0 in | Wt 219.0 lb

## 2018-02-16 DIAGNOSIS — R140 Abdominal distension (gaseous): Secondary | ICD-10-CM

## 2018-02-16 NOTE — Patient Instructions (Signed)
Samples if Dexilant given to patient. She will call with a PR report in 2 weeks.

## 2018-02-16 NOTE — Progress Notes (Signed)
Subjective:    Patient ID: Annette [name], female    DOB: 10/23/1960, 57 y.o.   MRN: [id number]  HPI Referred by Dr. Moshe Cipro for bloating.  She says her bloating is awful. Bloating for years.  She says she cannot burp. She says she has never been able to bloat.  She has tried the Gas X which really did not help.  The bloating is worse while she works. She tries to eat something light for work.  She says it becomes worse when she walks. She says the Omeprazole helped for a long time. The Omeprazole stopped working about 2-3 months ago.   12/19/2015 EGD: dysphagia, GERD, abdominal bloating.  2cm hiatal hernia.  Normal stomach.  Review of Systems Past Medical History:  Diagnosis Date  . Breast cancer ([place]) 2007   History of  XRT, onTamoxifen  . Bruises easily   . Depression   . Diabetes mellitus    prediabetic  . GERD (gastroesophageal reflux disease)   . Hypertriglyceridemia   . Hypothyroidism    following chemo amnd radiaition for breast cancer, needed replacement short term  . Hypothyroidism (acquired)    replaced x 1 year  . IBS (irritable bowel syndrome)   . Menieres disease    Controlled with triamterene   . Migraines   . Obesity   . Palpitations   . 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[place])   . Sleep apnea 2010   Problems with CPAP    Past Surgical History:  Procedure Laterality Date  . ABDOMINAL HYSTERECTOMY  1998   Benign, fibroids  . 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 APPENDECTOMY  06/12/08  . BREAST RECONSTRUCTION     Left reconstructive  . BREAST SURGERY  2007   Left lumpectomy  . BREAST SURGERY     Mammosite - right side  . CHOLECYSTECTOMY  1985  . ESOPHAGEAL DILATION  12/19/2015   Procedure: ESOPHAGEAL DILATION;  Surgeon: Rogene Houston, MD;  [facility]: [facility];  Service: Endoscopy;;  . ESOPHAGOGASTRODUODENOSCOPY N/A 12/19/2015   Procedure: ESOPHAGOGASTRODUODENOSCOPY (EGD);  Surgeon: Rogene Houston, MD;   Location: [facility];  Service: Endoscopy;  Laterality: N/A;  11:40  . 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 THYROIDECTOMY, PARTIAL  11/05/2010   Benign disease    Allergies  Allergen Reactions  . Penicillins Swelling    Has patient had a PCN reaction causing immediate rash, facial/tongue/throat swelling, SOB or lightheadedness with hypotension: Yes Has patient had a PCN reaction causing severe rash involving mucus membranes or skin necrosis: No Has patient had a PCN reaction that required hospitalization: No  Has patient had a PCN reaction occurring within the last 10 years: No If all of the above answers are "NO", then may proceed with Cephalosporin use.   Of face and tongue.  . Metformin And Related Nausea Only    Pt to discontinue med due to intolerance, cramps and diarrhea  . Statins Other (See Comments)    Major concerns re safety  . Tape Itching    Surgical adhesive  . Zetia [Ezetimibe] Other (See Comments)    Feels funny    Current Outpatient Medications on File Prior to Visit  Medication Sig Dispense Refill  . clotrimazole-betamethasone (LOTRISONE) cream Apply topically as needed. 30 g 1  . esomeprazole (NEXIUM) 40 MG capsule Take 1 capsule (40 mg total) by mouth daily. 90 capsule 3  . glipiZIDE (GLUCOTROL  XL) 5 MG 24 hr tablet TAKE 1 TABLET(5 MG) BY MOUTH DAILY WITH BREAKFAST 90 tablet 1  . LORazepam (ATIVAN) 1 MG tablet TAKE ONE TABLET BY MOUTH EVERY 8 HOURS AS NEEDED FOR ANXIETY AND/OR NAUSEA 30 tablet 5  . Metoprolol Tartrate 37.5 MG TABS Take 1 tablet by mouth 2 (two) times daily. 180 tablet 3  . naproxen (NAPROSYN) 500 MG tablet Take 1 tablet (500 mg total) by mouth 2 (two) times daily with a meal. (Patient taking differently: Take 500 mg by mouth as needed. ) 40 tablet 2  . NON FORMULARY [facility]  Scar Cream -  Verapamil 10%, Pentoxifylline 5% Apply 1-2 grams to affected area 3-4 times daily Qty. 120  gm 3 refills    . rosuvastatin (CRESTOR) 20 MG tablet Take 1 tablet (20 mg total) by mouth daily. 90 tablet 3  . temazepam (RESTORIL) 15 MG capsule One capsule at bedtime for sleep 90 capsule 1  . traZODone (DESYREL) 100 MG tablet Take 1 tablet (100 mg total) by mouth at bedtime. 90 tablet 3  . triamterene-hydrochlorothiazide (DYAZIDE) 37.5-25 MG capsule Take 1 each (1 capsule total) by mouth daily. 90 capsule 3  . venlafaxine XR (EFFEXOR-XR) 75 MG 24 hr capsule Take 1 capsule (75 mg total) by mouth daily. 90 capsule 3  . Vitamin D, Ergocalciferol, (DRISDOL) 50000 units CAPS capsule Take 1 capsule by mouth weekly. 12 capsule 0  . blood glucose meter kit and supplies Dispense based on patient and insurance preference. Use once daily testing dx e11.9 1 each 0  . FREESTYLE LITE test strip USE TO TEST BLOOD SUGAR ONCE A DAY (Patient not taking: Reported on 02/16/2018) 100 each 0  . Lancets (FREESTYLE) lancets USE TO TEST BLOOD SUGAR EVERY DAY 100 each 0   No current facility-administered medications on file prior to visit.         Objective:   Physical Exam Blood pressure 104/62, pulse 60, temperature 98.6 F (37 C), height 5' 7"  (1.702 m), weight 219 lb (99.3 kg). Alert and oriented. Skin warm and dry. Oral mucosa is moist.   . Sclera anicteric, conjunctivae is pink. Thyroid not enlarged. No cervical lymphadenopathy. Lungs clear. Heart regular rate and rhythm.  Abdomen is soft. Bowel sounds are positive. No hepatomegaly. No abdominal masses felt. No tenderness.  No edema to lower extremities.  .         Assessment & Plan:  Bloatin. Am going to try her on Dexilant and see how she does. She will let me know how she does. If not better, May try Protonix BID.

## 2018-03-02 ENCOUNTER — Other Ambulatory Visit: Payer: Self-pay | Admitting: Family Medicine

## 2018-03-05 ENCOUNTER — Other Ambulatory Visit: Payer: Self-pay | Admitting: Family Medicine

## 2018-03-05 DIAGNOSIS — F5101 Primary insomnia: Secondary | ICD-10-CM

## 2018-03-08 ENCOUNTER — Encounter: Payer: Self-pay | Admitting: Family Medicine

## 2018-03-08 LAB — HEPATIC FUNCTION PANEL
ALT: 18 IU/L (ref 0–32)
AST: 20 IU/L (ref 0–40)
Albumin: 4.1 g/dL (ref 3.5–5.5)
Alkaline Phosphatase: 80 IU/L (ref 39–117)
Bilirubin Total: 0.4 mg/dL (ref 0.0–1.2)
Bilirubin, Direct: 0.11 mg/dL (ref 0.00–0.40)
Total Protein: 7 g/dL (ref 6.0–8.5)

## 2018-03-18 ENCOUNTER — Other Ambulatory Visit: Payer: Self-pay | Admitting: Family Medicine

## 2018-03-23 ENCOUNTER — Other Ambulatory Visit: Payer: Self-pay | Admitting: Family Medicine

## 2018-04-17 ENCOUNTER — Ambulatory Visit (HOSPITAL_COMMUNITY): Payer: BLUE CROSS/BLUE SHIELD

## 2018-04-27 ENCOUNTER — Other Ambulatory Visit (HOSPITAL_COMMUNITY): Payer: Self-pay | Admitting: *Deleted

## 2018-04-27 ENCOUNTER — Other Ambulatory Visit: Payer: Self-pay | Admitting: Family Medicine

## 2018-04-27 ENCOUNTER — Ambulatory Visit (HOSPITAL_COMMUNITY)
Admission: RE | Admit: 2018-04-27 | Discharge: 2018-04-27 | Disposition: A | Discharge status: Home / Self Care | Payer: BLUE CROSS/BLUE SHIELD | Source: Ambulatory Visit | Attending: Oncology | Admitting: Oncology

## 2018-04-27 DIAGNOSIS — Z853 Personal history of malignant neoplasm of breast: Secondary | ICD-10-CM | POA: Insufficient documentation

## 2018-04-27 NOTE — Progress Notes (Signed)
Patient came today to have screening mammogram. Patient told technician in radiology that she has found a lump in the breast where she had breast cancer previously.  They canceled her screening mammogram and asked that we order diagnostic. Orders have been placed.

## 2018-05-10 ENCOUNTER — Other Ambulatory Visit (HOSPITAL_COMMUNITY): Payer: Self-pay | Admitting: Hematology

## 2018-05-10 DIAGNOSIS — IMO0002 Reserved for concepts with insufficient information to code with codable children: Secondary | ICD-10-CM

## 2018-05-10 DIAGNOSIS — R229 Localized swelling, mass and lump, unspecified: Principal | ICD-10-CM

## 2018-05-16 ENCOUNTER — Encounter (HOSPITAL_COMMUNITY): Payer: Self-pay

## 2018-05-16 ENCOUNTER — Ambulatory Visit (HOSPITAL_COMMUNITY)
Admission: RE | Admit: 2018-05-16 | Discharge: 2018-05-16 | Disposition: A | Discharge status: Home / Self Care | Payer: BLUE CROSS/BLUE SHIELD | Source: Ambulatory Visit | Attending: Hematology | Admitting: Hematology

## 2018-05-16 DIAGNOSIS — Z853 Personal history of malignant neoplasm of breast: Secondary | ICD-10-CM | POA: Diagnosis present

## 2018-05-22 ENCOUNTER — Telehealth: Payer: Self-pay | Admitting: Family Medicine

## 2018-05-22 ENCOUNTER — Other Ambulatory Visit: Payer: Self-pay

## 2018-05-22 MED ORDER — NAPROXEN 500 MG PO TABS: 500.0000 mg | ORAL_TABLET | Freq: Two times a day (BID) | ORAL | 0 refills | Status: DC

## 2018-05-22 NOTE — Telephone Encounter (Signed)
Patient is requesting a refill for 'ALL OF HER MEDICATIONS" to be sent to [address] [facility] in [place]. Cb# [phone] or [phone]

## 2018-05-22 NOTE — Telephone Encounter (Signed)
Mickel Baas from [facility] calling to refill naproxine 500mg  Cb#: [phone] Fax: [phone]

## 2018-05-23 ENCOUNTER — Other Ambulatory Visit: Payer: Self-pay

## 2018-05-23 ENCOUNTER — Telehealth: Payer: Self-pay | Admitting: Family Medicine

## 2018-05-23 ENCOUNTER — Other Ambulatory Visit: Payer: Self-pay | Admitting: Family Medicine

## 2018-05-23 DIAGNOSIS — E669 Obesity, unspecified: Principal | ICD-10-CM

## 2018-05-23 DIAGNOSIS — E1169 Type 2 diabetes mellitus with other specified complication: Secondary | ICD-10-CM

## 2018-05-23 DIAGNOSIS — F5101 Primary insomnia: Secondary | ICD-10-CM

## 2018-05-23 MED ORDER — ROSUVASTATIN CALCIUM 20 MG PO TABS: 20.0000 mg | ORAL_TABLET | Freq: Every day | ORAL | 3 refills | Status: DC

## 2018-05-23 MED ORDER — TRIAMTERENE-HCTZ 37.5-25 MG PO CAPS: 1.0000 | ORAL_CAPSULE | Freq: Every day | ORAL | 3 refills | Status: DC

## 2018-05-23 MED ORDER — GLIPIZIDE ER 5 MG PO TB24: ORAL_TABLET | ORAL | 3 refills | Status: DC

## 2018-05-23 MED ORDER — TEMAZEPAM 15 MG PO CAPS: ORAL_CAPSULE | ORAL | 0 refills | Status: DC

## 2018-05-23 MED ORDER — VENLAFAXINE HCL ER 75 MG PO CP24: 75.0000 mg | ORAL_CAPSULE | Freq: Every day | ORAL | 3 refills | Status: DC

## 2018-05-23 MED ORDER — VITAMIN D (ERGOCALCIFEROL) 1.25 MG (50000 UNIT) PO CAPS: ORAL_CAPSULE | ORAL | 0 refills | Status: DC

## 2018-05-23 MED ORDER — ESOMEPRAZOLE MAGNESIUM 40 MG PO CPDR: DELAYED_RELEASE_CAPSULE | ORAL | 3 refills | Status: DC

## 2018-05-23 NOTE — Telephone Encounter (Signed)
Pls order fasting lipid, cmp and EGFR, hBA1C , cBc and tSH for patient to obtain last week in August, she has a dept follow up, please let her know and mail the lab order to her, ( not sure which lab she uses , please check with her)  dX are diabetes, hyperlipidemias, hypertension and obesity I have signed off on the refills for her restoril please let her know and fax in. Thank you 

## 2018-05-23 NOTE — Telephone Encounter (Signed)
Called patient to ask which lab she would like to have her labs drawn at. No answer. Left vm requesting call back.

## 2018-05-23 NOTE — Telephone Encounter (Signed)
Medications refilled

## 2018-05-23 NOTE — Telephone Encounter (Signed)
Called patient to ask whether she wanted this sent to [facility] or [place]. Previous message requested all medication refills be sent to [facility] in [place]

## 2018-05-24 ENCOUNTER — Telehealth: Payer: Self-pay

## 2018-05-24 DIAGNOSIS — E785 Hyperlipidemia, unspecified: Secondary | ICD-10-CM

## 2018-05-24 DIAGNOSIS — I10 Essential (primary) hypertension: Secondary | ICD-10-CM

## 2018-05-24 DIAGNOSIS — E1169 Type 2 diabetes mellitus with other specified complication: Secondary | ICD-10-CM

## 2018-05-24 DIAGNOSIS — E669 Obesity, unspecified: Principal | ICD-10-CM

## 2018-05-24 NOTE — Telephone Encounter (Signed)
Spoke with patient regarding refill going to [facility]. She states she doesn't want anything to go to [facility] anymore. She also doesn't need the naproxen refilled at this time.

## 2018-05-24 NOTE — Telephone Encounter (Signed)
Labs ordered per Dr.Simpson. A1C, cmp+egfr, lipid, cbc, tsh. Ordered for [facility] per patient request

## 2018-05-24 NOTE — Telephone Encounter (Signed)
Spoke with patient. Labs ordered for [facility] to draw. Orders mailed to patient.

## 2018-05-25 ENCOUNTER — Ambulatory Visit: Payer: BLUE CROSS/BLUE SHIELD | Admitting: Family Medicine

## 2018-05-28 ENCOUNTER — Other Ambulatory Visit: Payer: Self-pay | Admitting: Family Medicine

## 2018-05-30 ENCOUNTER — Encounter: Payer: BLUE CROSS/BLUE SHIELD | Attending: Family Medicine | Admitting: Nutrition

## 2018-05-30 DIAGNOSIS — IMO0002 Reserved for concepts with insufficient information to code with codable children: Secondary | ICD-10-CM

## 2018-05-30 DIAGNOSIS — E118 Type 2 diabetes mellitus with unspecified complications: Secondary | ICD-10-CM

## 2018-05-30 DIAGNOSIS — E1165 Type 2 diabetes mellitus with hyperglycemia: Secondary | ICD-10-CM

## 2018-05-30 DIAGNOSIS — E669 Obesity, unspecified: Secondary | ICD-10-CM

## 2018-05-30 NOTE — Progress Notes (Signed)
  Medical Nutrition Therapy:  Appt start time: 1530  end time:  1600 Assessment:  Primary concerns today: Diabetes Type 2.. Lost 2 lbs.  Has cut out chocolate, chips and eating more fresh fruits and vegetables. Trying to eat three meals per day. Is working on cutting back on smoking. Has quit eating so much candy and sweets and salty snacks.  Just started eating better a week ago. Is sick and tired of being fat and not having energy.   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 5 mg GLipizide. FBS 78-130 mg/dl. Lost 4 lbs. Has been drinking unsweet tea.  Last 7 day 114 and last 30 days. Doing much better. Should get labs done in next few weeks. Glipidize 5 mg,   Lab Results  Component Value Date   HGBA1C 6.9 (H) 01/19/2018    \Preferred Learning Style:   Auditory  Visual  Hands on  Learning Readiness:   Ready  Change in progress   MEDICATIONS: See list   DIETARY INTAKE:    24-hr recall:  B (  9 AM): bacon bits, eggs and cheese omelet,  water L ( PM): sandwich or leftovers, water.  7-8 pm: Salad or chicken and green beans or left overs.    Beverages: Dt soda, unsweet tea or 1-2 cups coffee Usual physical activity:  ADL   Estimated energy needs: 1500  calories 170  g carbohydrates 112 g protein  42 g fat  Progress Towards Goal(s):  In progress.   Nutritional Diagnosis:  NB-1.1 Food and nutrition-related knowledge deficit As related to Diabetes.  As evidenced by A1C 7.2%..    Intervention:  Nutrition and Diabetes education provided on My Plate, CHO counting, meal planning, portion sizes, timing of meals, avoiding snacks between meals unless having a low blood sugar, target ranges for A1C and blood sugars, signs/symptoms and treatment of hyper/hypoglycemia, monitoring blood sugars, taking medications as prescribed, benefits of exercising 30 minutes per day and prevention of complications of DM.   Goals Keep up the great job!!!   Continue to increase fresh fruits and  vegetables Keep drinking water Walk 30 minutes  A day. Lose 1 lb per week Keep a food journal Get A1C to 6% or less.   Teaching Method Utilized:  Visual Auditory Hands on  Handouts given during visit include:  The Plate Method   Meal Plan Card  Diabetes Instructions.   Barriers to learning/adherence to lifestyle change: none  Demonstrated degree of understanding via:  Teach Back   Monitoring/Evaluation:  Dietary intake, exercise, meal planning, SBG, and body weight in 3 month(s).

## 2018-05-31 ENCOUNTER — Encounter: Payer: Self-pay | Admitting: Nutrition

## 2018-05-31 NOTE — Patient Instructions (Signed)
Goals Keep up the great job!!!   Continue to increase fresh fruits and vegetables Keep drinking water Walk 30 minutes  A day. Lose 1 lb per week Keep a food journal Get A1C to 6% or less.

## 2018-06-01 ENCOUNTER — Ambulatory Visit: Payer: BLUE CROSS/BLUE SHIELD | Admitting: Family Medicine

## 2018-06-07 ENCOUNTER — Ambulatory Visit: Payer: BLUE CROSS/BLUE SHIELD | Admitting: Family Medicine

## 2018-06-23 ENCOUNTER — Ambulatory Visit (HOSPITAL_COMMUNITY): Payer: BLUE CROSS/BLUE SHIELD | Admitting: Hematology

## 2018-06-27 ENCOUNTER — Telehealth: Payer: Self-pay | Admitting: Family Medicine

## 2018-06-27 ENCOUNTER — Other Ambulatory Visit: Payer: Self-pay

## 2018-06-27 ENCOUNTER — Other Ambulatory Visit: Payer: Self-pay | Admitting: Family Medicine

## 2018-06-27 DIAGNOSIS — E1169 Type 2 diabetes mellitus with other specified complication: Secondary | ICD-10-CM

## 2018-06-27 DIAGNOSIS — E669 Obesity, unspecified: Principal | ICD-10-CM

## 2018-06-27 DIAGNOSIS — F5101 Primary insomnia: Secondary | ICD-10-CM

## 2018-06-27 MED ORDER — NAPROXEN 500 MG PO TABS: 500.0000 mg | ORAL_TABLET | Freq: Two times a day (BID) | ORAL | 0 refills | Status: DC

## 2018-06-27 NOTE — Telephone Encounter (Signed)
Refill sent in

## 2018-06-27 NOTE — Telephone Encounter (Signed)
Altha Harm from [facility] (phone) [phone] (fax) [phone]  Patient needs refill of of naproxine 500mg 

## 2018-07-03 ENCOUNTER — Other Ambulatory Visit: Payer: Self-pay | Admitting: Family Medicine

## 2018-07-03 ENCOUNTER — Encounter: Payer: Self-pay | Admitting: Family Medicine

## 2018-07-03 MED ORDER — TEMAZEPAM 15 MG PO CAPS: 15.0000 mg | ORAL_CAPSULE | Freq: Every evening | ORAL | 0 refills | Status: DC | PRN

## 2018-07-04 ENCOUNTER — Other Ambulatory Visit: Payer: Self-pay

## 2018-07-06 ENCOUNTER — Telehealth: Payer: Self-pay | Admitting: Family Medicine

## 2018-07-06 DIAGNOSIS — E559 Vitamin D deficiency, unspecified: Secondary | ICD-10-CM

## 2018-07-06 DIAGNOSIS — E669 Obesity, unspecified: Secondary | ICD-10-CM

## 2018-07-06 DIAGNOSIS — I10 Essential (primary) hypertension: Secondary | ICD-10-CM

## 2018-07-06 DIAGNOSIS — E8881 Metabolic syndrome: Secondary | ICD-10-CM

## 2018-07-06 DIAGNOSIS — E1169 Type 2 diabetes mellitus with other specified complication: Secondary | ICD-10-CM

## 2018-07-06 NOTE — Telephone Encounter (Signed)
Do not send any more [facility]ILL PACK---only send to [facility]--[address] [place]

## 2018-07-06 NOTE — Telephone Encounter (Signed)
I have deleted the pill pak from her pharmacy list. I sent the last refill to [facility] (not [facility])

## 2018-07-07 ENCOUNTER — Ambulatory Visit (HOSPITAL_COMMUNITY): Payer: BLUE CROSS/BLUE SHIELD | Admitting: Hematology

## 2018-07-15 ENCOUNTER — Other Ambulatory Visit: Payer: Self-pay | Admitting: Family Medicine

## 2018-07-20 ENCOUNTER — Telehealth: Payer: Self-pay | Admitting: Family Medicine

## 2018-07-20 NOTE — Telephone Encounter (Signed)
Pt has sent previous message that she does not use [facility] pack anymore

## 2018-07-20 NOTE — Telephone Encounter (Signed)
temazepam (RESTORIL) 15 MG capsule  Medication  Date: 07/03/2018 Department: [facility] Ordering/Authorizing: Fayrene Helper, MD  Order Providers   Prescribing Provider Encounter Provider  Fayrene Helper, MD Fayrene Helper, MD  Outpatient Medication Detail    Disp Refills Start End   temazepam (RESTORIL) 15 MG capsule 30 capsule 0 07/03/2018    Sig - Route: Take 1 capsule (15 mg total) by mouth at bedtime as needed for sleep. - Oral   Class: Print   Notes to Pharmacy: Needs office visit before any more refills are sent in   [place], [place] Encounter  Priority and Order Details   Please resend to [facility]

## 2018-07-25 ENCOUNTER — Ambulatory Visit (INDEPENDENT_AMBULATORY_CARE_PROVIDER_SITE_OTHER): Payer: BLUE CROSS/BLUE SHIELD | Admitting: Family Medicine

## 2018-07-25 ENCOUNTER — Encounter: Payer: Self-pay | Admitting: Family Medicine

## 2018-07-25 VITALS — BP 108/66 | HR 68 | Resp 12 | Ht 67.0 in | Wt 215.1 lb

## 2018-07-25 DIAGNOSIS — I10 Essential (primary) hypertension: Secondary | ICD-10-CM | POA: Diagnosis not present

## 2018-07-25 DIAGNOSIS — E1169 Type 2 diabetes mellitus with other specified complication: Secondary | ICD-10-CM

## 2018-07-25 DIAGNOSIS — E785 Hyperlipidemia, unspecified: Secondary | ICD-10-CM | POA: Diagnosis not present

## 2018-07-25 DIAGNOSIS — G4730 Sleep apnea, unspecified: Secondary | ICD-10-CM

## 2018-07-25 DIAGNOSIS — F17218 Nicotine dependence, cigarettes, with other nicotine-induced disorders: Secondary | ICD-10-CM

## 2018-07-25 DIAGNOSIS — Z23 Encounter for immunization: Secondary | ICD-10-CM

## 2018-07-25 DIAGNOSIS — E669 Obesity, unspecified: Secondary | ICD-10-CM

## 2018-07-25 MED ORDER — GLIPIZIDE ER 5 MG PO TB24: ORAL_TABLET | ORAL | 3 refills | Status: DC

## 2018-07-25 MED ORDER — VITAMIN D (ERGOCALCIFEROL) 1.25 MG (50000 UNIT) PO CAPS: 50000.0000 [IU] | ORAL_CAPSULE | ORAL | 0 refills | Status: DC

## 2018-07-25 MED ORDER — NAPROXEN 500 MG PO TABS: 500.0000 mg | ORAL_TABLET | Freq: Two times a day (BID) | ORAL | 0 refills | Status: DC

## 2018-07-25 MED ORDER — TRIAMTERENE-HCTZ 37.5-25 MG PO TABS: ORAL_TABLET | ORAL | 3 refills | Status: DC

## 2018-07-25 MED ORDER — METOPROLOL TARTRATE 37.5 MG PO TABS: 1.0000 | ORAL_TABLET | Freq: Two times a day (BID) | ORAL | 3 refills | Status: DC

## 2018-07-25 MED ORDER — TRAZODONE HCL 100 MG PO TABS: 100.0000 mg | ORAL_TABLET | Freq: Every day | ORAL | 3 refills | Status: DC

## 2018-07-25 MED ORDER — TRIAMTERENE-HCTZ 37.5-25 MG PO CAPS: 1.0000 | ORAL_CAPSULE | Freq: Every day | ORAL | 3 refills | Status: DC

## 2018-07-25 MED ORDER — VENLAFAXINE HCL ER 75 MG PO CP24: 75.0000 mg | ORAL_CAPSULE | Freq: Every day | ORAL | 3 refills | Status: DC

## 2018-07-25 NOTE — Patient Instructions (Addendum)
Annual physical exam in 3 months  Prevnar today  Please reduce triamterene to HALF tablet daily due to low blood pressure, tablets have been prescribed   PLEASE get message here by tomorrow re labs so I can decide medication maamanagement  Please limit max cigarette to 6/ day

## 2018-07-26 ENCOUNTER — Other Ambulatory Visit: Payer: Self-pay | Admitting: Family Medicine

## 2018-07-29 ENCOUNTER — Encounter: Payer: Self-pay | Admitting: Family Medicine

## 2018-07-29 DIAGNOSIS — Z23 Encounter for immunization: Secondary | ICD-10-CM | POA: Insufficient documentation

## 2018-07-29 NOTE — Assessment & Plan Note (Signed)
Over corrected, dose reduction in medication DASH diet and commitment to daily physical activity for a minimum of 30 minutes discussed and encouraged, as a part of hypertension management. The importance of attaining a healthy weight is also discussed.  BP/Weight 07/25/2018 02/16/2018 01/24/2018 12/27/2017 11/10/2017 09/15/2017 7/37/1062  Systolic BP 694 854 627 - - 035 009  Diastolic BP 66 62 70 - - 70 64  Wt. (Lbs) 215.12 219 221 217 221 219 218.5  BMI 33.69 34.3 35.67 35.02 35.67 35.35 35.27

## 2018-07-29 NOTE — Assessment & Plan Note (Signed)
Hyperlipidemia:Low fat diet discussed and encouraged.   Lipid Panel  Lab Results  Component Value Date   CHOL 218 (H) 01/19/2018   HDL 38 (L) 01/19/2018   LDLCALC 158 (H) 01/19/2018   TRIG 112 01/19/2018   CHOLHDL 5.7 (H) 01/19/2018  Updated lab needed .

## 2018-07-29 NOTE — Assessment & Plan Note (Signed)
Asked: confirms currently smoking 8 to 10  Cigarettes/day Assess; unwilling to quit currently but will cut back Advise: needs to quit to reduce cancer risk , already had breast cancer, also strong  f/h CAD, needs to reduce her risk Assist: counseled for 5 mins, and literature offered Arrange : f/u in 3 months

## 2018-07-29 NOTE — Progress Notes (Signed)
Annette [name]     MRN: [id number]      DOB: January 01, 1961   HPI Annette [name] is here for follow up and re-evaluation of chronic medical conditions, medication management and review of any available recent lab and radiology data.  Preventive health is updated, specifically  Cancer screening and Immunization.   Has not followed through on cardiology eval discussed at last visit The PT c/o intermittent light headedness Not testing blood sugar on a daily basis Denies polyuria, polydipsia, blurred vision , or hypoglycemic episodes. States she had labs which are overdue, lab contacted , no record, will need re draw if this is the case, she is to contact office with follow up Ongoing chronic complaint of stress on the  Job, hours cut, reduced income and overwork ROS Denies recent fever or chills. Denies sinus pressure, nasal congestion, ear pain or sore throat. Denies chest congestion, productive cough or wheezing. Denies chest pains, palpitations and leg swelling Denies abdominal pain, nausea, vomiting,diarrhea or constipation.   Denies dysuria, frequency, hesitancy or incontinence. C/o  joint pain,  and limitation in mobility. Denies headaches, seizures, numbness, or tingling. Denies uncontroilled  depression, anxiety or insomnia.Takes medication which is working for her Denies skin break down or rash.   PE  BP 108/66 (BP Location: Right Arm, Patient Position: Sitting, Cuff Size: Large)   Pulse 68   Resp 12   Ht 5\' 7"  (1.702 m)   Wt 215 lb 1.9 oz (97.6 kg)   SpO2 96%   BMI 33.69 kg/m   Patient alert and oriented and in no cardiopulmonary distress.  HEENT: No facial asymmetry, EOMI,   oropharynx pink and moist.  Neck supple no JVD, no mass.  Chest: Clear to auscultation bilaterally.decfrewased air entry   CVS: S1, S2 no murmurs, no S3.Regular rate.  ABD: Soft non tender.   Ext: No edema  MS: Adequate ROM spine, shoulders, hips and knees.  Skin: Intact, no ulcerations  or rash noted.  Psych: Good eye contact, normal affect. Memory intact not anxious or depressed appearing.  CNS: CN 2-12 intact, power,  normal throughout.no focal deficits noted.   Assessment & Plan  HTN, goal below 130/80 Over corrected, dose reduction in medication DASH diet and commitment to daily physical activity for a minimum of 30 minutes discussed and encouraged, as a part of hypertension management. The importance of attaining a healthy weight is also discussed.  BP/Weight 07/25/2018 02/16/2018 01/24/2018 12/27/2017 11/10/2017 09/15/2017 9/67/8938  Systolic BP 101 751 025 - - 852 778  Diastolic BP 66 62 70 - - 70 64  Wt. (Lbs) 215.12 219 221 217 221 219 218.5  BMI 33.69 34.3 35.67 35.02 35.67 35.35 35.27       Diabetes mellitus type 2 in obese Annette [name]) Ms. Creech is reminded of the importance of commitment to daily physical activity for 30 minutes or more, as able and the need to limit carbohydrate intake to 30 to 60 grams per meal to help with blood sugar control.   The need to take medication as prescribed, test blood sugar as directed, and to call between visits if there is a concern that blood sugar is uncontrolled is also discussed.   Ms. Quakenbush is reminded of the importance of daily foot exam, annual eye examination, and good blood sugar, blood pressure and cholesterol control.  Diabetic Labs Latest Ref Rng & Units 01/19/2018 09/16/2017 06/14/2017 05/31/2017 07/17/2015  HbA1c 4.8 - 5.6 % 6.9(H) - 7.2(H) - 6.4(A)  Microalbumin Not  Estab. ug/mL - <3.0(H) - - -  Micro/Creat Ratio 0.0 - 30.0 mg/g creat - <6.0 - - -  Chol 100 - 199 mg/dL 218(H) - 200(H) - 196  HDL >39 mg/dL 38(L) - 38(L) - -  Calc LDL 0 - 99 mg/dL 158(H) - 143(H) - 132  Triglycerides 0 - 149 mg/dL 112 - 94 - -  Creatinine 0.57 - 1.00 mg/dL 1.07(H) - - 1.00 -   BP/Weight 07/25/2018 02/16/2018 01/24/2018 12/27/2017 11/10/2017 09/15/2017 9/37/1696  Systolic BP 789 381 017 - - 510 258  Diastolic BP 66 62 70 - -  70 64  Wt. (Lbs) 215.12 219 221 217 221 219 218.5  BMI 33.69 34.3 35.67 35.02 35.67 35.35 35.27   Foot/eye exam completion dates 07/15/2014  Foot Form Completion Done   Updated lab needed and is past due      Hyperlipidemia LDL goal <100 Hyperlipidemia:Low fat diet discussed and encouraged.   Lipid Panel  Lab Results  Component Value Date   CHOL 218 (H) 01/19/2018   HDL 38 (L) 01/19/2018   LDLCALC 158 (H) 01/19/2018   TRIG 112 01/19/2018   CHOLHDL 5.7 (H) 01/19/2018  Updated lab needed .      Morbid obesity improved Patient re-educated about  the importance of commitment to a  minimum of 150 minutes of exercise per week.  The importance of healthy food choices with portion control discussed. Encouraged to start a food diary, count calories and to consider  joining a support group. Sample diet sheets offered. Goals set by the patient for the next several months.   Weight /BMI 07/25/2018 02/16/2018 01/24/2018  WEIGHT 215 lb 1.9 oz 219 lb 221 lb  HEIGHT 5\' 7"  5\' 7"  5\' 6"   BMI 33.69 kg/m2 34.3 kg/m2 35.67 kg/m2      Nicotine dependence Asked: confirms currently smoking 8 to 10  Cigarettes/day Assess; unwilling to quit currently but will cut back Advise: needs to quit to reduce cancer risk , already had breast cancer, also strong  f/h CAD, needs to reduce her risk Assist: counseled for 5 mins, and literature offered Arrange : f/u in 3 months  Sleep apnea Reports compliance with CPAP  Need for vaccination with 13-polyvalent pneumococcal conjugate vaccine After obtaining informed consent, the vaccine is  administered by LPN.

## 2018-07-29 NOTE — Assessment & Plan Note (Signed)
After obtaining informed consent, the vaccine is  administered by LPN.  

## 2018-07-29 NOTE — Assessment & Plan Note (Signed)
Annette [name] is reminded of the importance of commitment to daily physical activity for 30 minutes or more, as able and the need to limit carbohydrate intake to 30 to 60 grams per meal to help with blood sugar control.   The need to take medication as prescribed, test blood sugar as directed, and to call between visits if there is a concern that blood sugar is uncontrolled is also discussed.   Annette [name] is reminded of the importance of daily foot exam, annual eye examination, and good blood sugar, blood pressure and cholesterol control.  Diabetic Labs Latest Ref Rng & Units 01/19/2018 09/16/2017 06/14/2017 05/31/2017 07/17/2015  HbA1c 4.8 - 5.6 % 6.9(H) - 7.2(H) - 6.4(A)  Microalbumin Not Estab. ug/mL - <3.0(H) - - -  Micro/Creat Ratio 0.0 - 30.0 mg/g creat - <6.0 - - -  Chol 100 - 199 mg/dL 218(H) - 200(H) - 196  HDL >39 mg/dL 38(L) - 38(L) - -  Calc LDL 0 - 99 mg/dL 158(H) - 143(H) - 132  Triglycerides 0 - 149 mg/dL 112 - 94 - -  Creatinine 0.57 - 1.00 mg/dL 1.07(H) - - 1.00 -   BP/Weight 07/25/2018 02/16/2018 01/24/2018 12/27/2017 11/10/2017 09/15/2017 11/14/1733  Systolic BP 670 141 030 - - 131 438  Diastolic BP 66 62 70 - - 70 64  Wt. (Lbs) 215.12 219 221 217 221 219 218.5  BMI 33.69 34.3 35.67 35.02 35.67 35.35 35.27   Foot/eye exam completion dates 07/15/2014  Foot Form Completion Done   Updated lab needed and is past due

## 2018-07-29 NOTE — Assessment & Plan Note (Signed)
Reports compliance with CPAP.

## 2018-07-29 NOTE — Assessment & Plan Note (Signed)
improved Patient re-educated about  the importance of commitment to a  minimum of 150 minutes of exercise per week.  The importance of healthy food choices with portion control discussed. Encouraged to start a food diary, count calories and to consider  joining a support group. Sample diet sheets offered. Goals set by the patient for the next several months.   Weight /BMI 07/25/2018 02/16/2018 01/24/2018  WEIGHT 215 lb 1.9 oz 219 lb 221 lb  HEIGHT 5\' 7"  5\' 7"  5\' 6"   BMI 33.69 kg/m2 34.3 kg/m2 35.67 kg/m2

## 2018-08-03 ENCOUNTER — Ambulatory Visit (HOSPITAL_COMMUNITY): Payer: BLUE CROSS/BLUE SHIELD | Admitting: Hematology

## 2018-08-09 ENCOUNTER — Other Ambulatory Visit: Payer: Self-pay | Admitting: Family Medicine

## 2018-08-09 LAB — CMP14+EGFR
ALT: 16 IU/L (ref 0–32)
AST: 15 IU/L (ref 0–40)
Albumin/Globulin Ratio: 1.5 (ref 1.2–2.2)
Albumin: 4 g/dL (ref 3.5–5.5)
Alkaline Phosphatase: 87 IU/L (ref 39–117)
BUN/Creatinine Ratio: 16 (ref 9–23)
BUN: 14 mg/dL (ref 6–24)
Bilirubin Total: 0.5 mg/dL (ref 0.0–1.2)
CO2: 24 mmol/L (ref 20–29)
Calcium: 9.9 mg/dL (ref 8.7–10.2)
Chloride: 104 mmol/L (ref 96–106)
Creatinine, Ser: 0.87 mg/dL (ref 0.57–1.00)
GFR calc Af Amer: 84 mL/min/{1.73_m2} (ref >59)
GFR calc non Af Amer: 73 mL/min/{1.73_m2} (ref >59)
Globulin, Total: 2.7 g/dL (ref 1.5–4.5)
Glucose: 189 mg/dL — ABNORMAL HIGH (ref 65–99)
Potassium: 4.5 mmol/L (ref 3.5–5.2)
Sodium: 145 mmol/L — ABNORMAL HIGH (ref 134–144)
Total Protein: 6.7 g/dL (ref 6.0–8.5)

## 2018-08-09 LAB — HEMOGLOBIN A1C
Est. average glucose Bld gHb Est-mCnc: 183 mg/dL
Hgb A1c MFr Bld: 8 % — ABNORMAL HIGH (ref 4.8–5.6)

## 2018-08-09 LAB — CBC
Hematocrit: 46.5 % (ref 34.0–46.6)
Hemoglobin: 15.9 g/dL (ref 11.1–15.9)
MCH: 31.7 pg (ref 26.6–33.0)
MCHC: 34.2 g/dL (ref 31.5–35.7)
MCV: 93 fL (ref 79–97)
Platelets: 383 10*3/uL (ref 150–450)
RBC: 5.02 x10E6/uL (ref 3.77–5.28)
RDW: 12 % — ABNORMAL LOW (ref 12.3–15.4)
WBC: 10.7 10*3/uL (ref 3.4–10.8)

## 2018-08-09 LAB — LIPID PANEL
Chol/HDL Ratio: 3.3 ratio (ref 0.0–4.4)
Cholesterol, Total: 138 mg/dL (ref 100–199)
HDL: 42 mg/dL (ref >39)
LDL Calculated: 76 mg/dL (ref 0–99)
Triglycerides: 98 mg/dL (ref 0–149)
VLDL Cholesterol Cal: 20 mg/dL (ref 5–40)

## 2018-08-09 MED ORDER — GLIPIZIDE ER 2.5 MG PO TB24: 2.5000 mg | ORAL_TABLET | Freq: Every day | ORAL | 3 refills | Status: DC

## 2018-08-13 ENCOUNTER — Encounter: Payer: Self-pay | Admitting: Family Medicine

## 2018-08-14 ENCOUNTER — Encounter: Payer: Self-pay | Admitting: Family Medicine

## 2018-08-15 ENCOUNTER — Other Ambulatory Visit: Payer: Self-pay | Admitting: Family Medicine

## 2018-08-15 MED ORDER — GLIPIZIDE ER 5 MG PO TB24: 5.0000 mg | ORAL_TABLET | Freq: Every day | ORAL | 1 refills | Status: DC

## 2018-08-15 NOTE — Progress Notes (Signed)
glip

## 2018-08-30 ENCOUNTER — Encounter (HOSPITAL_COMMUNITY): Payer: Self-pay | Admitting: Hematology

## 2018-08-30 ENCOUNTER — Other Ambulatory Visit: Payer: Self-pay

## 2018-08-30 ENCOUNTER — Inpatient Hospital Stay (HOSPITAL_COMMUNITY): Payer: BLUE CROSS/BLUE SHIELD | Attending: Hematology | Admitting: Hematology

## 2018-08-30 ENCOUNTER — Inpatient Hospital Stay (HOSPITAL_COMMUNITY): Payer: BLUE CROSS/BLUE SHIELD

## 2018-08-30 VITALS — BP 105/52 | HR 68 | Temp 98.8°F | Resp 18 | Wt 215.8 lb

## 2018-08-30 DIAGNOSIS — Z7984 Long term (current) use of oral hypoglycemic drugs: Secondary | ICD-10-CM | POA: Diagnosis not present

## 2018-08-30 DIAGNOSIS — Z79899 Other long term (current) drug therapy: Secondary | ICD-10-CM | POA: Insufficient documentation

## 2018-08-30 DIAGNOSIS — E039 Hypothyroidism, unspecified: Secondary | ICD-10-CM | POA: Insufficient documentation

## 2018-08-30 DIAGNOSIS — Z853 Personal history of malignant neoplasm of breast: Secondary | ICD-10-CM | POA: Diagnosis not present

## 2018-08-30 DIAGNOSIS — Z170 Estrogen receptor positive status [ER+]: Secondary | ICD-10-CM

## 2018-08-30 DIAGNOSIS — Z9221 Personal history of antineoplastic chemotherapy: Secondary | ICD-10-CM | POA: Diagnosis not present

## 2018-08-30 DIAGNOSIS — Z9081 Acquired absence of spleen: Secondary | ICD-10-CM | POA: Insufficient documentation

## 2018-08-30 DIAGNOSIS — Z923 Personal history of irradiation: Secondary | ICD-10-CM | POA: Insufficient documentation

## 2018-08-30 DIAGNOSIS — F17210 Nicotine dependence, cigarettes, uncomplicated: Secondary | ICD-10-CM | POA: Insufficient documentation

## 2018-08-30 DIAGNOSIS — Z23 Encounter for immunization: Secondary | ICD-10-CM | POA: Insufficient documentation

## 2018-08-30 DIAGNOSIS — E119 Type 2 diabetes mellitus without complications: Secondary | ICD-10-CM | POA: Diagnosis not present

## 2018-08-30 MED ORDER — CLOTRIMAZOLE-BETAMETHASONE 1-0.05 % EX CREA: TOPICAL_CREAM | CUTANEOUS | 1 refills | Status: DC | PRN

## 2018-08-30 NOTE — Assessment & Plan Note (Signed)
1.  Stage I left breast cancer, ER positive and HER-2 positive: -Diagnosed on 12/10/2005, status post lumpectomy by Dr. Anders Grant in [place]. -Status post Taxotere and Cytoxan plus Herceptin for 6 cycles followed by 1 year of Herceptin. -5 years of tamoxifen completed in November 2012. - Physical examination today was within normal limits. -We reviewed the results of the mammogram dated 05/16/2018 which was BI-RADS Category 2. -She will come back in a year for follow-up.  We have scheduled her for next mammogram.  2.  Splenectomy: -She had left adrenalectomy and splenectomy on 02/16/2012 for adrenal adenoma. - She has received her PCV 13 pneumococcal vaccine at her [place] office on 07/28/2018.  We will give her meningococcal vaccine today.  These will be repeated every 5 years.

## 2018-08-30 NOTE — Progress Notes (Signed)
[facility] [place], [place] [address]   CLINIC:  Medical Oncology/Hematology  PCP:  Fayrene Helper, MD [address] [place] [address] [phone]   REASON FOR VISIT: Follow-up for Stage I breast cancer, HER-2 +  CURRENT THERAPY: observation   INTERVAL HISTORY:  [name] 57 y.o. female returns for routine follow-up for breast cancer. She is doing well with no complaints at this time. She denies any new pains or lumps present. Denies any GI issues. Denies any bleeding or easy bruising. Denies any fevers or recent infections. She reports her appetite and energy level at 100% and she has no problem maintaining her weight. She lives at home and performs all her own ADLs and activities.    REVIEW OF SYSTEMS:  Review of Systems  All other systems reviewed and are negative.    PAST MEDICAL/SURGICAL HISTORY:  Past Medical History:  Diagnosis Date  . Breast cancer ([place]) 2007   History of  XRT, onTamoxifen  . Bruises easily   . Depression   . Diabetes mellitus    prediabetic  . GERD (gastroesophageal reflux disease)   . Hypertriglyceridemia   . Hypothyroidism    following chemo amnd radiaition for breast cancer, needed replacement short term  . Hypothyroidism (acquired)    replaced x 1 year  . IBS (irritable bowel syndrome)   . Menieres disease    Controlled with triamterene   . Migraines   . Obesity   . Palpitations   . 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[place])   . Sleep apnea 2010   Problems with CPAP   Past Surgical History:  Procedure Laterality Date  . ABDOMINAL HYSTERECTOMY  1998   Benign, fibroids  . 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 APPENDECTOMY  06/12/08  . BREAST RECONSTRUCTION     Left reconstructive  . BREAST SURGERY  2007   Left lumpectomy  . BREAST SURGERY     [facility] - right side  . CHOLECYSTECTOMY  1985  . ESOPHAGEAL DILATION  12/19/2015   Procedure:  ESOPHAGEAL DILATION;  Surgeon: Rogene Houston, MD;  Location: [facility];  Service: Endoscopy;;  . ESOPHAGOGASTRODUODENOSCOPY N/A 12/19/2015   Procedure: ESOPHAGOGASTRODUODENOSCOPY (EGD);  Surgeon: Rogene Houston, MD;  Location: [facility];  Service: Endoscopy;  Laterality: N/A;  11:40  . SPLENECTOMY, TOTAL  9/77/4142   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  . THYROIDECTOMY, PARTIAL  11/05/2010   Benign disease     SOCIAL HISTORY:  Social History   Socioeconomic History  . Marital status: Married    Spouse name: Not on file  . Number of children: Not on file  . Years of education: Not on file  . Highest education level: Not on file  Occupational History  . Not on file  Social Needs  . Financial resource strain: Not on file  . Food insecurity:    Worry: Not on file    Inability: Not on file  . Transportation needs:    Medical: Not on file    Non-medical: Not on file  Tobacco Use  . Smoking status: Current Every Day Smoker    Packs/day: 0.30    Years: 0.50    Pack years: 0.15    Types: Cigarettes  . Smokeless tobacco: Never Used  . Tobacco comment: 1/2 pack a day  Substance and Sexual Activity  . Alcohol use: No    Alcohol/week: 0.0 standard drinks  Comment: Encouraged to quit smoking. She has tried the nicotone gum and patches but did not help  . Drug use: No  . Sexual activity: Yes    Birth control/protection: Surgical  Lifestyle  . Physical activity:    Days per week: Not on file    Minutes per session: Not on file  . Stress: Not on file  Relationships  . Social connections:    Talks on phone: Not on file    Gets together: Not on file    Attends religious service: Not on file    Active member of club or organization: Not on file    Attends meetings of clubs or organizations: Not on file    Relationship status: Not on file  . Intimate partner violence:    Fear of current or ex partner: Not on file    Emotionally abused: Not on file     Physically abused: Not on file    Forced sexual activity: Not on file  Other Topics Concern  . Not on file  Social History Narrative  . Not on file    FAMILY HISTORY:  Family History  Problem Relation Age of Onset  . Diabetes Mother   . Heart disease Father   . Cancer Paternal Grandmother        Breast    CURRENT MEDICATIONS:  Outpatient Encounter Medications as of 08/30/2018  Medication Sig Note  . blood glucose meter kit and supplies Dispense based on patient and insurance preference. Use once daily testing dx e11.9   . clotrimazole-betamethasone (LOTRISONE) cream Apply topically as needed.   Marland Kitchen esomeprazole (NEXIUM) 40 MG capsule TAKE 1 CAPSULE BY MOUTH EVERY DAY   . FREESTYLE LITE test strip USE TO TEST BLOOD SUGAR ONCE A DAY   . glipiZIDE (GLUCOTROL XL) 5 MG 24 hr tablet Take 1 tablet (5 mg total) by mouth daily with breakfast.   . Lancets (FREESTYLE) lancets USE TO TEST BLOOD SUGAR EVERY DAY   . LORazepam (ATIVAN) 1 MG tablet TAKE ONE TABLET BY MOUTH EVERY 8 HOURS AS NEEDED FOR ANXIETY AND/OR NAUSEA   . Metoprolol Tartrate 37.5 MG TABS Take 1 tablet by mouth 2 (two) times daily.   . naproxen (NAPROSYN) 500 MG tablet Take 1 tablet (500 mg total) by mouth 2 (two) times daily with a meal. 07/25/2018: For headaches take as needed, stress  . NON FORMULARY [facility]  Scar Cream -  Verapamil 10%, Pentoxifylline 5% Apply 1-2 grams to affected area 3-4 times daily Qty. 120 gm 3 refills   . rosuvastatin (CRESTOR) 20 MG tablet Take 1 tablet (20 mg total) by mouth daily.   . temazepam (RESTORIL) 15 MG capsule Take 1 capsule (15 mg total) by mouth at bedtime as needed for sleep.   . traZODone (DESYREL) 100 MG tablet Take 1 tablet (100 mg total) by mouth at bedtime.   . triamterene-hydrochlorothiazide (DYAZIDE) 37.5-25 MG capsule    . venlafaxine XR (EFFEXOR-XR) 75 MG 24 hr capsule Take 1 capsule (75 mg total) by mouth daily.   . Vitamin D, Ergocalciferol, (DRISDOL) 50000 units  CAPS capsule Take 1 capsule (50,000 Units total) by mouth every 7 (seven) days. 1 capsule by mouth weekly   . [DISCONTINUED] clotrimazole-betamethasone (LOTRISONE) cream Apply topically as needed.   . [DISCONTINUED] glipiZIDE (GLUCOTROL XL) 2.5 MG 24 hr tablet Take 1 tablet (2.5 mg total) by mouth daily with breakfast.   . [DISCONTINUED] triamterene-hydrochlorothiazide (MAXZIDE-25) 37.5-25 MG tablet One half tablet once daily  No facility-administered encounter medications on file as of 08/30/2018.     ALLERGIES:  Allergies  Allergen Reactions  . Penicillins Swelling    Has patient had a PCN reaction causing immediate rash, facial/tongue/throat swelling, SOB or lightheadedness with hypotension: Yes Has patient had a PCN reaction causing severe rash involving mucus membranes or skin necrosis: No Has patient had a PCN reaction that required hospitalization: No  Has patient had a PCN reaction occurring within the last 10 years: No If all of the above answers are "NO", then may proceed with Cephalosporin use.   Of face and tongue.  . Metformin And Related Nausea Only    Pt to discontinue med due to intolerance, cramps and diarrhea  . Statins Other (See Comments)    Major concerns re safety  . Tape Itching    Surgical adhesive  . Zetia [Ezetimibe] Other (See Comments)    Feels funny     PHYSICAL EXAM:  ECOG Performance status: 1  Vitals:   08/30/18 1316  BP: (!) 105/52  Pulse: 68  Resp: 18  Temp: 98.8 F (37.1 C)  SpO2: 95%   Filed Weights   08/30/18 1316  Weight: 215 lb 12.8 oz (97.9 kg)    Physical Exam  Constitutional: She is oriented to person, place, and time. She appears well-developed and well-nourished.  Cardiovascular: Normal rate, regular rhythm and normal heart sounds.  Pulmonary/Chest: Effort normal and breath sounds normal.  Abdominal: Soft.  Musculoskeletal: Normal range of motion.  Neurological: She is alert and oriented to person, place, and time.    Skin: Skin is warm and dry.  Psychiatric: She has a normal mood and affect. Her behavior is normal. Judgment and thought content normal.  Breast: Left lumpectomy site is within normal limits.  No palpable masses, no skin changes or nipple discharge, no adenopathy.   LABORATORY DATA:  I have reviewed the labs as listed.  CBC    Component Value Date/Time   WBC 10.7 08/08/2018 1407   WBC 12.1 (H) 05/31/2017 2141   RBC 5.02 08/08/2018 1407   RBC 4.61 05/31/2017 2141   HGB 15.9 08/08/2018 1407   HCT 46.5 08/08/2018 1407   PLT 383 08/08/2018 1407   MCV 93 08/08/2018 1407   MCH 31.7 08/08/2018 1407   MCH 33.4 05/31/2017 2141   MCHC 34.2 08/08/2018 1407   MCHC 34.1 05/31/2017 2141   RDW 12.0 (L) 08/08/2018 1407   LYMPHSABS 5.5 (H) 05/31/2017 2141   MONOABS 1.0 05/31/2017 2141   EOSABS 0.5 05/31/2017 2141   BASOSABS 0.1 05/31/2017 2141   CMP Latest Ref Rng & Units 08/08/2018 03/07/2018 01/19/2018  Glucose 65 - 99 mg/dL 189(H) - 156(H)  BUN 6 - 24 mg/dL 14 - 17  Creatinine 0.57 - 1.00 mg/dL 0.87 - 1.07(H)  Sodium 134 - 144 mmol/L 145(H) - 144  Potassium 3.5 - 5.2 mmol/L 4.5 - 4.3  Chloride 96 - 106 mmol/L 104 - 105  CO2 20 - 29 mmol/L 24 - 24  Calcium 8.7 - 10.2 mg/dL 9.9 - 9.8  Total Protein 6.0 - 8.5 g/dL 6.7 7.0 7.0  Total Bilirubin 0.0 - 1.2 mg/dL 0.5 0.4 0.4  Alkaline Phos 39 - 117 IU/L 87 80 78  AST 0 - 40 IU/L _0 ALT 0 - 32 IU/L _1 DIAGNOSTIC IMAGING:  I have independently reviewed the scans and discussed with the patient.  I have reviewed Francene Finders, NP's note  and agree with the documentation.  I personally performed a face-to-face visit, made revisions and my assessment and plan is as follows.    ASSESSMENT & PLAN:   BREAST CANCER, HX OF 1.  Stage I left breast cancer, ER positive and HER-2 positive: -Diagnosed on 12/10/2005, status post lumpectomy by Dr. Anders Grant in [place]. -Status post Taxotere and Cytoxan plus Herceptin for 6 cycles followed  by 1 year of Herceptin. -5 years of tamoxifen completed in November 2012. - Physical examination today was within normal limits. -We reviewed the results of the mammogram dated 05/16/2018 which was BI-RADS Category 2. -She will come back in a year for follow-up.  We have scheduled her for next mammogram.  2.  Splenectomy: -She had left adrenalectomy and splenectomy on 02/16/2012 for adrenal adenoma. - She has received her PCV 13 pneumococcal vaccine at her [place] office on 07/28/2018.  We will give her meningococcal vaccine today.  These will be repeated every 5 years.      Orders placed this encounter:  Orders Placed This Encounter  Procedures  . MM Digital Screening  . Meningococcal conjugate vaccine 4-valent IM  . CBC with Differential/Platelet  . Comprehensive metabolic panel  . Lactate dehydrogenase      Derek Jack, MD [place] [phone]

## 2018-08-30 NOTE — Progress Notes (Signed)
Patient tolerated injection with no complaints voiced.  Site clean and dry.  Band aid applied.  VSs with discharge and left ambulatory with no s/s of distress noted.

## 2018-08-30 NOTE — Patient Instructions (Signed)
[place] at [facility] Discharge Instructions  Follow up in 1 year.   Thank you for choosing [place] at [facility] to provide your oncology and hematology care.  To afford each patient quality time with our provider, please arrive at least 15 minutes before your scheduled appointment time.   If you have a lab appointment with the [facility] please come in thru the  Main Entrance and check in at the main information desk  You need to re-schedule your appointment should you arrive 10 or more minutes late.  We strive to give you quality time with our providers, and arriving late affects you and other patients whose appointments are after yours.  Also, if you no show three or more times for appointments you may be dismissed from the clinic at the providers discretion.     Again, thank you for choosing [facility].  Our hope is that these requests will decrease the amount of time that you wait before being seen by our physicians.       _____________________________________________________________  Should you have questions after your visit to [facility], please contact our office at [phone] between the hours of 8:00 a.m. and 4:30 p.m.  Voicemails left after 4:00 p.m. will not be returned until the following business day.  For prescription refill requests, have your pharmacy contact our office and allow 72 hours.    Cancer Center Support Programs:   > Cancer Support Group  2nd Tuesday of the month 1pm-2pm, Journey Room

## 2018-09-05 ENCOUNTER — Encounter: Payer: Self-pay | Admitting: Family Medicine

## 2018-09-05 ENCOUNTER — Other Ambulatory Visit: Payer: Self-pay | Admitting: Family Medicine

## 2018-09-05 MED ORDER — FLUCONAZOLE 150 MG PO TABS: 150.0000 mg | ORAL_TABLET | Freq: Once | ORAL | 2 refills | Status: AC

## 2018-09-07 ENCOUNTER — Other Ambulatory Visit: Payer: Self-pay

## 2018-09-07 MED ORDER — ESOMEPRAZOLE MAGNESIUM 40 MG PO CPDR: DELAYED_RELEASE_CAPSULE | ORAL | 2 refills | Status: DC

## 2018-10-18 ENCOUNTER — Encounter: Payer: BLUE CROSS/BLUE SHIELD | Admitting: Family Medicine

## 2018-11-18 ENCOUNTER — Encounter: Payer: Self-pay | Admitting: Family Medicine

## 2018-11-20 ENCOUNTER — Telehealth: Payer: Self-pay

## 2018-11-20 DIAGNOSIS — I10 Essential (primary) hypertension: Secondary | ICD-10-CM

## 2018-11-20 DIAGNOSIS — E669 Obesity, unspecified: Principal | ICD-10-CM

## 2018-11-20 DIAGNOSIS — E785 Hyperlipidemia, unspecified: Secondary | ICD-10-CM

## 2018-11-20 DIAGNOSIS — E1169 Type 2 diabetes mellitus with other specified complication: Secondary | ICD-10-CM

## 2018-11-20 DIAGNOSIS — R946 Abnormal results of thyroid function studies: Secondary | ICD-10-CM

## 2018-11-20 NOTE — Telephone Encounter (Signed)
Labs ordered.

## 2018-11-20 NOTE — Telephone Encounter (Signed)
Added on t3 and t4

## 2018-11-21 ENCOUNTER — Ambulatory Visit: Payer: BC Managed Care – PPO | Admitting: Family Medicine

## 2018-11-21 ENCOUNTER — Encounter: Payer: Self-pay | Admitting: Family Medicine

## 2018-11-21 VITALS — BP 106/60 | HR 66 | Resp 14 | Ht 66.0 in | Wt 216.1 lb

## 2018-11-21 DIAGNOSIS — E1169 Type 2 diabetes mellitus with other specified complication: Secondary | ICD-10-CM

## 2018-11-21 DIAGNOSIS — J0110 Acute frontal sinusitis, unspecified: Secondary | ICD-10-CM

## 2018-11-21 DIAGNOSIS — E785 Hyperlipidemia, unspecified: Secondary | ICD-10-CM

## 2018-11-21 DIAGNOSIS — E669 Obesity, unspecified: Secondary | ICD-10-CM

## 2018-11-21 DIAGNOSIS — F17218 Nicotine dependence, cigarettes, with other nicotine-induced disorders: Secondary | ICD-10-CM

## 2018-11-21 DIAGNOSIS — I10 Essential (primary) hypertension: Secondary | ICD-10-CM

## 2018-11-21 DIAGNOSIS — J440 Chronic obstructive pulmonary disease with acute lower respiratory infection: Secondary | ICD-10-CM | POA: Diagnosis not present

## 2018-11-21 DIAGNOSIS — J209 Acute bronchitis, unspecified: Secondary | ICD-10-CM | POA: Diagnosis not present

## 2018-11-21 MED ORDER — BENZONATATE 100 MG PO CAPS: 100.0000 mg | ORAL_CAPSULE | Freq: Two times a day (BID) | ORAL | 0 refills | Status: DC | PRN

## 2018-11-21 MED ORDER — SULFAMETHOXAZOLE-TRIMETHOPRIM 800-160 MG PO TABS: 1.0000 | ORAL_TABLET | Freq: Two times a day (BID) | ORAL | 0 refills | Status: DC

## 2018-11-21 MED ORDER — FLUCONAZOLE 150 MG PO TABS: ORAL_TABLET | ORAL | 0 refills | Status: DC

## 2018-11-21 NOTE — Patient Instructions (Addendum)
Physical and pap in 4 months, call if if you need me before  I will message your lab results and encourage you to call  Nurse to discuss any questions, alaso message back  Congrats on being down to 8 cigg/day  You have 10 days of antibiotic and a decongeestant prescribed  Lab order to follow  It is important that you exercise regularly at least 30 minutes 5 times a week. If you develop chest pain, have severe difficulty breathing, or feel very tired, stop exercising immediately and seek medical attention    Think about what you will eat, plan ahead. Choose " clean, green, fresh or frozen" over canned, processed or packaged foods which are more sugary, salty and fatty. 70 to 75% of food eaten should be vegetables and fruit. Three meals at set times with snacks allowed between meals, but they must be fruit or vegetables. Aim to eat over a 12 hour period , example 7 am to 7 pm, and STOP after  your last meal of the day. Drink water,generally about 64 ounces per day, no other drink is as healthy. Fruit juice is best enjoyed in a healthy way, by EATING the fruit.   Thank you  for choosing [facility]. We consider it a privelige to serve you.  Delivering excellent health care in a caring and  compassionate way is our goal.  Partnering with you,  so that together we can achieve this goal is our strategy.

## 2018-11-21 NOTE — Progress Notes (Signed)
Annette [name]     MRN: [id number]      DOB: [birth year]   HPI Annette [name] is here for follow up and re-evaluation of chronic medical conditions, medication management and review of any available recent lab and radiology data.  Preventive health is updated, specifically  Cancer screening and Immunization.   Questions or concerns regarding consultations or procedures which the PT has had in the interim are  addressed. 1 week h/o worsening head and chest congestion, associated with fever and chills intermittently. Nasal drainage has thickened , and is yellowish green, and at times bloody. Sputum is thick and yellow. C/o bilateral ear pressure, denies hearing loss and sore throat. Increasing fatigue , poor appetitie and sleep disturbed by cough. No improvement with OTC medication.    ROS  Denies chest pains, palpitations and leg swelling Denies abdominal pain, nausea, vomiting,diarrhea or constipation.   Denies dysuria, frequency, hesitancy or incontinence. Denies joint pain, swelling and limitation in mobility. Denies headaches, seizures, numbness, or tingling. Denies uncontrolled depression, anxiety or insomnia. Denies skin break down or rash.   PE  BP 106/60   Pulse 66   Resp 14   Ht 5\' 6"  (1.676 m)   Wt 216 lb 1.9 oz (98 kg)   SpO2 95% Comment: room air  BMI 34.88 kg/m   Patient alert and oriented and in no cardiopulmonary distress.  HEENT: No facial asymmetry, EOMI,   oropharynx pink and moist.  Neck supple no JVD, no mass.TM clear, frontal sinus tender, TM clear,   Chest: decreased air entry, scattered crackles, no wheezes  CVS: S1, S2 no murmurs, no S3.Regular rate.  ABD: Soft non tender.   Ext: No edema  MS: Adequate ROM spine, shoulders, hips and knees.  Skin: Intact, no ulcerations or rash noted.  Psych: Good eye contact, normal affect. Memory intact not anxious or depressed appearing.  CNS: CN 2-12 intact, power,  normal throughout.no focal  deficits noted.   Assessment & Plan  Acute bronchitis with COPD ([place]) antibiotic and decongestant prescribed  Nicotine dependence Asked:confirms currently smokes cigarettes Assess: Unwilling to quit but cutting back Advise: needs to QUIT to reduce risk of cancer, cardio and cerebrovascular disease Assist: counseled for 5 minutes and literature provided Arrange: follow up in 3 months   Hyperlipidemia LDL goal <100 Hyperlipidemia:Low fat diet discussed and encouraged.   Lipid Panel  Lab Results  Component Value Date   CHOL 115 11/21/2018   HDL 37 (L) 11/21/2018   LDLCALC 59 11/21/2018   TRIG 93 11/21/2018   CHOLHDL 3.1 11/21/2018     Marked improvement , needs to inc exercise  HTN, goal below 130/80 Controlled, no change in medication DASH diet and commitment to daily physical activity for a minimum of 30 minutes discussed and encouraged, as a part of hypertension management. The importance of attaining a healthy weight is also discussed.  BP/Weight 11/21/2018 08/30/2018 07/25/2018 02/16/2018 01/24/2018 04/11/6282 03/09/2946  Systolic BP 654 650 354 656 812 - -  Diastolic BP 60 52 66 62 70 - -  Wt. (Lbs) 216.12 215.8 215.12 219 221 217 221  BMI 34.88 33.8 33.69 34.3 35.67 35.02 35.67       Frontal sinusitis Antibiotic and saline flushes  Obesity (BMI 30.0-34.9) Deteriorated.  Patient re-educated about  the importance of commitment to a  minimum of 150 minutes of exercise per week as able.  The importance of healthy food choices with portion control discussed, as well as eating regularly and within  a 12 hour window most days. The need to choose "clean , green" food 50 to 75% of the time is discussed, as well as to make water the primary drink and set a goal of 64 ounces water daily.  Encouraged to start a food diary,  and to consider  joining a support group. Sample diet sheets offered. Goals set by the patient for the next several months.   Weight /BMI 11/21/2018  08/30/2018 07/25/2018  WEIGHT 216 lb 1.9 oz 215 lb 12.8 oz 215 lb 1.9 oz  HEIGHT 5\' 6"  - 5\' 7"   BMI 34.88 kg/m2 33.8 kg/m2 33.69 kg/m2      Type 2 diabetes mellitus with other specified complication ([place]) Deteriorated Annette [name] is reminded of the importance of commitment to daily physical activity for 30 minutes or more, as able and the need to limit carbohydrate intake to 30 to 60 grams per meal to help with blood sugar control.   The need to take medication as prescribed, test blood sugar as directed, and to call between visits if there is a concern that blood sugar is uncontrolled is also discussed.   Annette [name] is reminded of the importance of daily foot exam, annual eye examination, and good blood sugar, blood pressure and cholesterol control. Medication adjustment made  Diabetic Labs Latest Ref Rng & Units 11/21/2018 08/08/2018 01/19/2018 09/16/2017 06/14/2017  HbA1c 4.8 - 5.6 % 8.4(H) 8.0(H) 6.9(H) - 7.2(H)  Microalbumin Not Estab. ug/mL - - - <3.0(H) -  Micro/Creat Ratio 0.0 - 30.0 mg/g creat - - - <6.0 -  Chol 100 - 199 mg/dL 115 138 218(H) - 200(H)  HDL >39 mg/dL 37(L) 42 38(L) - 38(L)  Calc LDL 0 - 99 mg/dL 59 76 158(H) - 143(H)  Triglycerides 0 - 149 mg/dL 93 98 112 - 94  Creatinine 0.57 - 1.00 mg/dL 1.01(H) 0.87 1.07(H) - -   BP/Weight 11/21/2018 08/30/2018 07/25/2018 02/16/2018 01/24/2018 1/65/5374 05/05/7077  Systolic BP 675 449 201 007 121 - -  Diastolic BP 60 52 66 62 70 - -  Wt. (Lbs) 216.12 215.8 215.12 219 221 217 221  BMI 34.88 33.8 33.69 34.3 35.67 35.02 35.67   Foot/eye exam completion dates 07/15/2014  Foot Form Completion Done

## 2018-11-22 ENCOUNTER — Encounter: Payer: Self-pay | Admitting: Family Medicine

## 2018-11-22 ENCOUNTER — Other Ambulatory Visit: Payer: Self-pay | Admitting: Family Medicine

## 2018-11-22 LAB — CMP14+EGFR
ALT: 18 IU/L (ref 0–32)
AST: 14 IU/L (ref 0–40)
Albumin/Globulin Ratio: 1.8 (ref 1.2–2.2)
Albumin: 4.2 g/dL (ref 3.8–4.9)
Alkaline Phosphatase: 84 IU/L (ref 39–117)
BUN/Creatinine Ratio: 16 (ref 9–23)
BUN: 16 mg/dL (ref 6–24)
Bilirubin Total: 0.4 mg/dL (ref 0.0–1.2)
CO2: 25 mmol/L (ref 20–29)
Calcium: 9.8 mg/dL (ref 8.7–10.2)
Chloride: 105 mmol/L (ref 96–106)
Creatinine, Ser: 1.01 mg/dL — ABNORMAL HIGH (ref 0.57–1.00)
GFR calc Af Amer: 70 mL/min/{1.73_m2} (ref >59)
GFR calc non Af Amer: 61 mL/min/{1.73_m2} (ref >59)
Globulin, Total: 2.4 g/dL (ref 1.5–4.5)
Glucose: 184 mg/dL — ABNORMAL HIGH (ref 65–99)
Potassium: 5.2 mmol/L (ref 3.5–5.2)
Sodium: 144 mmol/L (ref 134–144)
Total Protein: 6.6 g/dL (ref 6.0–8.5)

## 2018-11-22 LAB — HEMOGLOBIN A1C
Est. average glucose Bld gHb Est-mCnc: 194 mg/dL
Hgb A1c MFr Bld: 8.4 % — ABNORMAL HIGH (ref 4.8–5.6)

## 2018-11-22 LAB — T3, FREE: T3, Free: 3.2 pg/mL (ref 2.0–4.4)

## 2018-11-22 LAB — LIPID PANEL
Chol/HDL Ratio: 3.1 ratio (ref 0.0–4.4)
Cholesterol, Total: 115 mg/dL (ref 100–199)
HDL: 37 mg/dL — ABNORMAL LOW (ref >39)
LDL Calculated: 59 mg/dL (ref 0–99)
Triglycerides: 93 mg/dL (ref 0–149)
VLDL Cholesterol Cal: 19 mg/dL (ref 5–40)

## 2018-11-22 LAB — T4, FREE: Free T4: 1.08 ng/dL (ref 0.82–1.77)

## 2018-11-22 LAB — TSH: TSH: 1.68 u[IU]/mL (ref 0.450–4.500)

## 2018-11-22 MED ORDER — GLIPIZIDE ER 10 MG PO TB24: 10.0000 mg | ORAL_TABLET | Freq: Every day | ORAL | 1 refills | Status: DC

## 2018-11-27 ENCOUNTER — Encounter: Payer: Self-pay | Admitting: Family Medicine

## 2018-11-27 DIAGNOSIS — J321 Chronic frontal sinusitis: Secondary | ICD-10-CM | POA: Insufficient documentation

## 2018-11-27 NOTE — Assessment & Plan Note (Signed)
Deteriorated.  Patient re-educated about  the importance of commitment to a  minimum of 150 minutes of exercise per week as able.  The importance of healthy food choices with portion control discussed, as well as eating regularly and within a 12 hour window most days. The need to choose "clean , green" food 50 to 75% of the time is discussed, as well as to make water the primary drink and set a goal of 64 ounces water daily.  Encouraged to start a food diary,  and to consider  joining a support group. Sample diet sheets offered. Goals set by the patient for the next several months.   Weight /BMI 11/21/2018 08/30/2018 07/25/2018  WEIGHT 216 lb 1.9 oz 215 lb 12.8 oz 215 lb 1.9 oz  HEIGHT 5\' 6"  - 5\' 7"   BMI 34.88 kg/m2 33.8 kg/m2 33.69 kg/m2

## 2018-11-27 NOTE — Assessment & Plan Note (Signed)
antibiotic and decongestant prescribed

## 2018-11-27 NOTE — Assessment & Plan Note (Signed)
Hyperlipidemia:Low fat diet discussed and encouraged.   Lipid Panel  Lab Results  Component Value Date   CHOL 115 11/21/2018   HDL 37 (L) 11/21/2018   LDLCALC 59 11/21/2018   TRIG 93 11/21/2018   CHOLHDL 3.1 11/21/2018     Marked improvement , needs to inc exercise

## 2018-11-27 NOTE — Assessment & Plan Note (Signed)
Controlled, no change in medication DASH diet and commitment to daily physical activity for a minimum of 30 minutes discussed and encouraged, as a part of hypertension management. The importance of attaining a healthy weight is also discussed.  BP/Weight 11/21/2018 08/30/2018 07/25/2018 02/16/2018 01/24/2018 4/38/3818 4/0/3754  Systolic BP 360 677 034 035 248 - -  Diastolic BP 60 52 66 62 70 - -  Wt. (Lbs) 216.12 215.8 215.12 219 221 217 221  BMI 34.88 33.8 33.69 34.3 35.67 35.02 35.67

## 2018-11-27 NOTE — Assessment & Plan Note (Signed)
Antibiotic and saline flushes

## 2018-11-27 NOTE — Assessment & Plan Note (Signed)
Asked:confirms currently smokes cigarettes Assess: Unwilling to quit but cutting back Advise: needs to QUIT to reduce risk of cancer, cardio and cerebrovascular disease Assist: counseled for 5 minutes and literature provided Arrange: follow up in 3 months  

## 2018-11-27 NOTE — Assessment & Plan Note (Signed)
Deteriorated Annette [name] is reminded of the importance of commitment to daily physical activity for 30 minutes or more, as able and the need to limit carbohydrate intake to 30 to 60 grams per meal to help with blood sugar control.   The need to take medication as prescribed, test blood sugar as directed, and to call between visits if there is a concern that blood sugar is uncontrolled is also discussed.   Annette [name] is reminded of the importance of daily foot exam, annual eye examination, and good blood sugar, blood pressure and cholesterol control. Medication adjustment made  Diabetic Labs Latest Ref Rng & Units 11/21/2018 08/08/2018 01/19/2018 09/16/2017 06/14/2017  HbA1c 4.8 - 5.6 % 8.4(H) 8.0(H) 6.9(H) - 7.2(H)  Microalbumin Not Estab. ug/mL - - - <3.0(H) -  Micro/Creat Ratio 0.0 - 30.0 mg/g creat - - - <6.0 -  Chol 100 - 199 mg/dL 115 138 218(H) - 200(H)  HDL >39 mg/dL 37(L) 42 38(L) - 38(L)  Calc LDL 0 - 99 mg/dL 59 76 158(H) - 143(H)  Triglycerides 0 - 149 mg/dL 93 98 112 - 94  Creatinine 0.57 - 1.00 mg/dL 1.01(H) 0.87 1.07(H) - -   BP/Weight 11/21/2018 08/30/2018 07/25/2018 02/16/2018 01/24/2018 8/84/1660 03/07/159  Systolic BP 109 323 557 322 025 - -  Diastolic BP 60 52 66 62 70 - -  Wt. (Lbs) 216.12 215.8 215.12 219 221 217 221  BMI 34.88 33.8 33.69 34.3 35.67 35.02 35.67   Foot/eye exam completion dates 07/15/2014  Foot Form Completion Done

## 2018-12-09 ENCOUNTER — Other Ambulatory Visit: Payer: Self-pay | Admitting: Family Medicine

## 2018-12-20 ENCOUNTER — Telehealth: Payer: Self-pay | Admitting: Family Medicine

## 2018-12-20 MED ORDER — FREESTYLE LANCETS MISC: 5 refills | Status: DC

## 2018-12-20 NOTE — Telephone Encounter (Signed)
Done

## 2018-12-20 NOTE — Telephone Encounter (Signed)
Pt needs lancets called in

## 2018-12-26 ENCOUNTER — Telehealth: Payer: Self-pay | Admitting: *Deleted

## 2018-12-26 ENCOUNTER — Other Ambulatory Visit: Payer: Self-pay | Admitting: Family Medicine

## 2018-12-26 MED ORDER — BLOOD GLUCOSE METER KIT: PACK | 0 refills | Status: DC

## 2018-12-26 NOTE — Telephone Encounter (Signed)
New script faxed for one touch meter and testing supplies.

## 2018-12-26 NOTE — Telephone Encounter (Signed)
Pt called and said the freestyle light test strips were not covered by her insurance so she needs a prescription for one touch the strips and the meter and needles can be sent to [facility] on [address] in [place]

## 2019-01-05 ENCOUNTER — Encounter: Payer: Self-pay | Admitting: Family Medicine

## 2019-01-08 ENCOUNTER — Ambulatory Visit (INDEPENDENT_AMBULATORY_CARE_PROVIDER_SITE_OTHER): Payer: BC Managed Care – PPO | Admitting: Family Medicine

## 2019-01-08 ENCOUNTER — Encounter: Payer: Self-pay | Admitting: Family Medicine

## 2019-01-08 DIAGNOSIS — E785 Hyperlipidemia, unspecified: Secondary | ICD-10-CM

## 2019-01-08 DIAGNOSIS — E1169 Type 2 diabetes mellitus with other specified complication: Secondary | ICD-10-CM

## 2019-01-08 DIAGNOSIS — F17218 Nicotine dependence, cigarettes, with other nicotine-induced disorders: Secondary | ICD-10-CM

## 2019-01-08 DIAGNOSIS — R42 Dizziness and giddiness: Secondary | ICD-10-CM | POA: Insufficient documentation

## 2019-01-08 DIAGNOSIS — I10 Essential (primary) hypertension: Secondary | ICD-10-CM

## 2019-01-08 MED ORDER — TRAZODONE HCL 100 MG PO TABS: 100.0000 mg | ORAL_TABLET | Freq: Every day | ORAL | 3 refills | Status: DC

## 2019-01-08 MED ORDER — ESOMEPRAZOLE MAGNESIUM 40 MG PO CPDR: DELAYED_RELEASE_CAPSULE | ORAL | 3 refills | Status: DC

## 2019-01-08 MED ORDER — VENLAFAXINE HCL ER 75 MG PO CP24: 75.0000 mg | ORAL_CAPSULE | Freq: Every day | ORAL | 3 refills | Status: DC

## 2019-01-08 MED ORDER — METOPROLOL TARTRATE 37.5 MG PO TABS: 1.0000 | ORAL_TABLET | Freq: Two times a day (BID) | ORAL | 3 refills | Status: DC

## 2019-01-08 MED ORDER — TRIAMTERENE-HCTZ 37.5-25 MG PO TABS: ORAL_TABLET | ORAL | 3 refills | Status: DC

## 2019-01-08 MED ORDER — GLIPIZIDE ER 10 MG PO TB24: 10.0000 mg | ORAL_TABLET | Freq: Every day | ORAL | 1 refills | Status: DC

## 2019-01-08 MED ORDER — ROSUVASTATIN CALCIUM 20 MG PO TABS: 20.0000 mg | ORAL_TABLET | Freq: Every day | ORAL | 3 refills | Status: DC

## 2019-01-08 NOTE — Assessment & Plan Note (Signed)
Unconrolled  And deteriorating despite pt's report of compliance with diet. Refer to Endo. Increase glipizide to 15 mg today, I believe that insulin long acting is infdicated , will send a msg to Endo before starting Annette [name] is reminded of the importance of commitment to daily physical activity for 30 minutes or more, as able and the need to limit carbohydrate intake to 30 to 60 grams per meal to help with blood sugar control.   The need to take medication as prescribed, test blood sugar as directed, and to call between visits if there is a concern that blood sugar is uncontrolled is also discussed.   Annette [name] is reminded of the importance of daily foot exam, annual eye examination, and good blood sugar, blood pressure and cholesterol control.  Diabetic Labs Latest Ref Rng & Units 11/21/2018 08/08/2018 01/19/2018 09/16/2017 06/14/2017  HbA1c 4.8 - 5.6 % 8.4(H) 8.0(H) 6.9(H) - 7.2(H)  Microalbumin Not Estab. ug/mL - - - <3.0(H) -  Micro/Creat Ratio 0.0 - 30.0 mg/g creat - - - <6.0 -  Chol 100 - 199 mg/dL 115 138 218(H) - 200(H)  HDL >39 mg/dL 37(L) 42 38(L) - 38(L)  Calc LDL 0 - 99 mg/dL 59 76 158(H) - 143(H)  Triglycerides 0 - 149 mg/dL 93 98 112 - 94  Creatinine 0.57 - 1.00 mg/dL 1.01(H) 0.87 1.07(H) - -   BP/Weight 01/08/2019 11/21/2018 08/30/2018 07/25/2018 02/16/2018 01/24/2018 7/74/1287  Systolic BP 867 672 094 709 628 366 -  Diastolic BP 60 60 52 66 62 70 -  Wt. (Lbs) 217 216.12 215.8 215.12 219 221 217  BMI 35.02 34.88 33.8 33.69 34.3 35.67 35.02   Foot/eye exam completion dates 07/15/2014  Foot Form Completion Done     .

## 2019-01-08 NOTE — Assessment & Plan Note (Signed)
Asked:confirms currently smokes 7 to 10 cigarettes daily Assess: Unwilling to set a  quit date , but cutting back Advise: needs to QUIT to reduce risk of cancer, cardio and cerebrovascular disease Assist: counseled for 5 minutes and literature provided Arrange: follow up in 3 months

## 2019-01-08 NOTE — Assessment & Plan Note (Signed)
Over corrected and symptomatic, dose of maxizde reduce to half tablet daily

## 2019-01-08 NOTE — Assessment & Plan Note (Signed)
Reports maxzide prescribed for this, however, hypotensive and light headed, reduce dose by 50 %

## 2019-01-08 NOTE — Patient Instructions (Signed)
Keep f/u appointment in June as before, please call if you need me sooner  You are referred to Dr Dorris Fetch for management of your diabetes. Today start taking an additional 5 mg of glipizide , so that your total dose is 15 mg daily. I believe that you need insulin , and will reach out to Dr Dorris Fetch for his input before prescribing it, and will contact you by My chart message tomorrow  Please  DO follow carbohydrate limited diet , with 30 to 45 gm  Carbohydrate per meal and eat at set times  You are referred for an [place] to evaluate for blockage in neck arteries , since you report more frequent episodes of light headedness.  Please break triamterene  In HALF, new dose is HALF tablet once daily, this may reduce your light headedness since on current dose , your blood pressure is lower than necessary, and this may be contributing to your light headedness.  Please DO START coated aspirin 81 mg once daily to reduce risk of heart disease  Pleas continue to work on cutting back smoking and make quitting your ultimate goal. Call [facility] for help 24/7

## 2019-01-08 NOTE — Progress Notes (Signed)
Virtual Visit via Telephone Note  I connected with Annette [name] on 01/08/19 at  3:00 PM EDT by telephone and verified that I am speaking with the correct person using two identifiers.   I discussed the limitations, risks, security and privacy concerns of performing an evaluation and management service by telephone and the availability of in person appointments. I also discussed with the patient that there may be a patient responsible charge related to this service. The patient expressed understanding and agreed to proceed. Patient  Is at home and I am also at my home.Visual contact is desired but not currently possible during the visit  We agree referral to endo and possible addition of the insulin pending Endo recommendation by tomorrow, latest, today take an additional 5 mg glipizide , total of 15 mg over this 24 hr period C/o light headedness as tho she will pass out approx 5 times per week increased in frequency duration unchanged less than 1 minute, not specifically related to position/ change in position    History of Present Illness: See HPI    Observations/Objective: BP 110/60   Ht 5\' 6"  (1.676 m)   Wt 217 lb (98.4 kg)   BMI 35.02 kg/m    Assessment and Plan: Meniere's disease Reports maxzide prescribed for this, however, hypotensive and light headed, reduce dose by 50 %  Nicotine dependence Asked:confirms currently smokes 7 to 10 cigarettes daily Assess: Unwilling to set a  quit date , but cutting back Advise: needs to QUIT to reduce risk of cancer, cardio and cerebrovascular disease Assist: counseled for 5 minutes and literature provided Arrange: follow up in 3 months   Type 2 diabetes mellitus with other specified complication (Annette [name]) Unconrolled  And deteriorating despite pt's report of compliance with diet. Refer to Endo. Increase glipizide to 15 mg today, I believe that insulin long acting is infdicated , will send a msg to Endo before starting Annette [name] is  reminded of the importance of commitment to daily physical activity for 30 minutes or more, as able and the need to limit carbohydrate intake to 30 to 60 grams per meal to help with blood sugar control.   The need to take medication as prescribed, test blood sugar as directed, and to call between visits if there is a concern that blood sugar is uncontrolled is also discussed.   Annette [name] is reminded of the importance of daily foot exam, annual eye examination, and good blood sugar, blood pressure and cholesterol control.  Diabetic Labs Latest Ref Rng & Units 11/21/2018 08/08/2018 01/19/2018 09/16/2017 06/14/2017  HbA1c 4.8 - 5.6 % 8.4(H) 8.0(H) 6.9(H) - 7.2(H)  Microalbumin Not Estab. ug/mL - - - <3.0(H) -  Micro/Creat Ratio 0.0 - 30.0 mg/g creat - - - <6.0 -  Chol 100 - 199 mg/dL 115 138 218(H) - 200(H)  HDL >39 mg/dL 37(L) 42 38(L) - 38(L)  Calc LDL 0 - 99 mg/dL 59 76 158(H) - 143(H)  Triglycerides 0 - 149 mg/dL 93 98 112 - 94  Creatinine 0.57 - 1.00 mg/dL 1.01(H) 0.87 1.07(H) - -   BP/Weight 01/08/2019 11/21/2018 08/30/2018 07/25/2018 02/16/2018 01/24/2018 04/04/6377  Systolic BP 588 502 774 128 786 767 -  Diastolic BP 60 60 52 66 62 70 -  Wt. (Lbs) 217 216.12 215.8 215.12 219 221 217  BMI 35.02 34.88 33.8 33.69 34.3 35.67 35.02   Foot/eye exam completion dates 07/15/2014  Foot Form Completion Done     .  Light headedness Recurrent  Near syncope with increased frequency in past 2 to 3 months. Has hyperlipidemia and is a smoker, start aspirin 81 mg daily and referred for carotid doppler when available  HTN, goal below 130/80 Over corrected and symptomatic, dose of maxizde reduce to half tablet daily    Follow Up Instructions:    I discussed the assessment and treatment plan with the patient. The patient was provided an opportunity to ask questions and all were answered. The patient agreed with the plan and demonstrated an understanding of the instructions.   The patient was  advised to call back or seek an in-person evaluation if the symptoms worsen or if the condition fails to improve as anticipated.  I provided 30 minutes of non-face-to-face time during this encounter.   [name], MD

## 2019-01-08 NOTE — Assessment & Plan Note (Signed)
Recurrent  Near syncope with increased frequency in past 2 to 3 months. Has hyperlipidemia and is a smoker, start aspirin 81 mg daily and referred for carotid doppler when available

## 2019-01-09 ENCOUNTER — Telehealth: Payer: Self-pay | Admitting: *Deleted

## 2019-01-09 NOTE — Telephone Encounter (Signed)
Prior authorization needed for glipizide er 10 mg er tablets received via fax

## 2019-01-09 NOTE — Telephone Encounter (Signed)
PA approved.

## 2019-01-11 ENCOUNTER — Encounter: Payer: Self-pay | Admitting: Family Medicine

## 2019-01-15 ENCOUNTER — Ambulatory Visit (INDEPENDENT_AMBULATORY_CARE_PROVIDER_SITE_OTHER): Payer: BLUE CROSS/BLUE SHIELD | Admitting: "Endocrinology

## 2019-01-15 ENCOUNTER — Encounter: Payer: Self-pay | Admitting: "Endocrinology

## 2019-01-15 ENCOUNTER — Other Ambulatory Visit: Payer: Self-pay

## 2019-01-15 DIAGNOSIS — E1165 Type 2 diabetes mellitus with hyperglycemia: Secondary | ICD-10-CM | POA: Diagnosis not present

## 2019-01-15 MED ORDER — INSULIN PEN NEEDLE 31G X 8 MM MISC: 1.0000 | 3 refills | Status: DC

## 2019-01-15 MED ORDER — INSULIN DEGLUDEC 100 UNIT/ML ~~LOC~~ SOPN: 20.0000 [IU] | PEN_INJECTOR | Freq: Every day | SUBCUTANEOUS | 2 refills | Status: DC

## 2019-01-15 NOTE — Patient Instructions (Signed)

## 2019-01-15 NOTE — Progress Notes (Signed)
Subjective:    Patient ID: Annette [name], female    DOB: 15-Oct-1960, PCP Fayrene Helper, MD   Past Medical History:  Diagnosis Date  . Breast cancer ([place]) 2007   History of  XRT, onTamoxifen  . Bruises easily   . Depression   . Diabetes mellitus    prediabetic  . GERD (gastroesophageal reflux disease)   . Hypertriglyceridemia   . Hypothyroidism    following chemo amnd radiaition for breast cancer, needed replacement short term  . Hypothyroidism (acquired)    replaced x 1 year  . IBS (irritable bowel syndrome)   . Menieres disease    Controlled with triamterene   . Migraines   . Obesity   . Palpitations   . 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[place])   . Sleep apnea 2010   Problems with CPAP   Past Surgical History:  Procedure Laterality Date  . ABDOMINAL HYSTERECTOMY  1998   Benign, fibroids  . 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 APPENDECTOMY  06/12/08  . BREAST RECONSTRUCTION     Left reconstructive  . BREAST SURGERY  2007   Left lumpectomy  . BREAST SURGERY     [place] - right side  . CHOLECYSTECTOMY  1985  . ESOPHAGEAL DILATION  12/19/2015   Procedure: ESOPHAGEAL DILATION;  Surgeon: Rogene Houston, MD;  Location: [facility];  Service: Endoscopy;;  . ESOPHAGOGASTRODUODENOSCOPY N/A 12/19/2015   Procedure: ESOPHAGOGASTRODUODENOSCOPY (EGD);  Surgeon: Rogene Houston, MD;  Location: [facility];  Service: Endoscopy;  Laterality: N/A;  11:40  . 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 THYROIDECTOMY, PARTIAL  11/05/2010   Benign disease   Social History   Socioeconomic History  . Marital status: Married    Spouse name: Not on file  . Number of children: Not on file  . Years of education: Not on file  . Highest education level: Not on file  Occupational History  . Not on file  Social Needs  . Financial resource strain: Not on file  . Food insecurity:    Worry:  Not on file    Inability: Not on file  . Transportation needs:    Medical: Not on file    Non-medical: Not on file  Tobacco Use  . Smoking status: Current Every Day Smoker    Packs/day: 0.30    Years: 0.50    Pack years: 0.15    Types: Cigarettes  . Smokeless tobacco: Never Used  . Tobacco comment: 1/2 pack a day  Substance and Sexual Activity  . Alcohol use: No    Alcohol/week: 0.0 standard drinks    Comment: Encouraged to quit smoking. She has tried the nicotone gum and patches but did not help  . Drug use: No  . Sexual activity: Yes    Birth control/protection: Surgical  Lifestyle  . Physical activity:    Days per week: Not on file    Minutes per session: Not on file  . Stress: Not on file  Relationships  . Social connections:    Talks on phone: Not on file    Gets together: Not on file    Attends religious service: Not on file    Active member of club or organization: Not on file    Attends meetings of clubs or organizations: Not on file    Relationship status: Not on file  Other Topics Concern  . Not on  file  Social History Narrative  . Not on file   Family History  Problem Relation Age of Onset  . Diabetes Mother   . Heart disease Father   . Cancer Paternal Grandmother        Breast   Outpatient Encounter Medications as of 01/15/2019  Medication Sig  . blood glucose meter kit and supplies Dispense based on patient and insurance preference. Use up to four times daily as directed. (FOR ICD-10 E10.9, E11.9).  . clotrimazole-betamethasone (LOTRISONE) cream Apply topically as needed.  Marland Kitchen esomeprazole (NEXIUM) 40 MG capsule TAKE 1 CAPSULE BY MOUTH EVERY DAY  . glipiZIDE (GLUCOTROL XL) 10 MG 24 hr tablet Take 1 tablet (10 mg total) by mouth daily with breakfast.  . insulin degludec (TRESIBA FLEXTOUCH) 100 UNIT/ML SOPN FlexTouch Pen Inject 0.2 mLs (20 Units total) into the skin daily.  . Insulin Pen Needle (B-D ULTRAFINE III SHORT PEN) 31G X 8 MM MISC 1 each by Does  not apply route as directed.  Marland Kitchen LORazepam (ATIVAN) 1 MG tablet TAKE ONE TABLET BY MOUTH EVERY 8 HOURS AS NEEDED FOR ANXIETY AND/OR NAUSEA  . Metoprolol Tartrate 37.5 MG TABS Take 1 tablet by mouth 2 (two) times daily.  . naproxen (NAPROSYN) 500 MG tablet Take 1 tablet (500 mg total) by mouth 2 (two) times daily with a meal.  . NON FORMULARY [facility]  Scar Cream -  Verapamil 10%, Pentoxifylline 5% Apply 1-2 grams to affected area 3-4 times daily Qty. 120 gm 3 refills  . rosuvastatin (CRESTOR) 20 MG tablet Take 1 tablet (20 mg total) by mouth daily.  . temazepam (RESTORIL) 15 MG capsule TAKE ONE CAPSULE BY MOUTH EVERY NIGHT AT BEDTIME AS NEEDED  . traZODone (DESYREL) 100 MG tablet Take 1 tablet (100 mg total) by mouth at bedtime.  . triamterene-hydrochlorothiazide (MAXZIDE-25) 37.5-25 MG tablet Take one half tablet by mouth once daily  . venlafaxine XR (EFFEXOR-XR) 75 MG 24 hr capsule Take 1 capsule (75 mg total) by mouth daily.  . Vitamin D, Ergocalciferol, (DRISDOL) 50000 units CAPS capsule Take 1 capsule (50,000 Units total) by mouth every 7 (seven) days. 1 capsule by mouth weekly   No facility-administered encounter medications on file as of 01/15/2019.    ALLERGIES: Allergies  Allergen Reactions  . Penicillins Swelling    Has patient had a PCN reaction causing immediate rash, facial/tongue/throat swelling, SOB or lightheadedness with hypotension: Yes Has patient had a PCN reaction causing severe rash involving mucus membranes or skin necrosis: No Has patient had a PCN reaction that required hospitalization: No  Has patient had a PCN reaction occurring within the last 10 years: No If all of the above answers are "NO", then may proceed with Cephalosporin use.   Of face and tongue.  . Metformin And Related Nausea Only    Pt to discontinue med due to intolerance, cramps and diarrhea  . Statins Other (See Comments)    Major concerns re safety  . Tape Itching    Surgical  adhesive  . Zetia [Ezetimibe] Other (See Comments)    Feels funny   VACCINATION STATUS: Immunization History  Administered Date(s) Administered  . Influenza Split 08/14/2012, 07/23/2016  . Influenza,inj,Quad PF,6+ Mos 07/23/2013, 07/16/2014, 07/16/2015  . Meningococcal Conjugate 08/30/2018  . Meningococcal Polysaccharide 02/26/2012  . Pneumococcal Conjugate-13 07/25/2018  . Pneumococcal Polysaccharide-23 08/04/2007, 02/26/2012  . Td 08/04/2007    HPI This is a 58 year old female patient was seen in August 2016 with prediabetes.  She declined metformin  intervention at that time citing family history of side effects from metformin.  Since her last visit, she was diagnosed with type 2 diabetes.  Her most recent A1c is 8.4% on November 21, 2017.  Patient recently observed that she is running hyperglycemia.  This is a telephone visit due to the COVID-19 pandemic.  She reports that her average blood glucose is 250+ milligrams per DL.  Is currently on glipizide 10 mg p.o. twice daily. He reports polydipsia, polyuria.  Review of Systems  Objective:    There were no vitals taken for this visit.  Wt Readings from Last 3 Encounters:  01/08/19 217 lb (98.4 kg)  11/21/18 216 lb 1.9 oz (98 kg)  08/30/18 215 lb 12.8 oz (97.9 kg)    Physical Exam   CMP ( most recent) CMP     Component Value Date/Time   NA 144 11/21/2018 0950   K 5.2 11/21/2018 0950   CL 105 11/21/2018 0950   CO2 25 11/21/2018 0950   GLUCOSE 184 (H) 11/21/2018 0950   GLUCOSE 115 (H) 05/31/2017 2141   BUN 16 11/21/2018 0950   CREATININE 1.01 (H) 11/21/2018 0950   CREATININE 0.74 11/15/2014 1119   CALCIUM 9.8 11/21/2018 0950   PROT 6.6 11/21/2018 0950   ALBUMIN 4.2 11/21/2018 0950   AST 14 11/21/2018 0950   ALT 18 11/21/2018 0950   ALKPHOS 84 11/21/2018 0950   BILITOT 0.4 11/21/2018 0950   GFRNONAA 61 11/21/2018 0950   GFRNONAA 81 11/26/2013 1437   GFRAA 70 11/21/2018 0950   GFRAA >89 11/26/2013 1437      Diabetic Labs (most recent): Lab Results  Component Value Date   HGBA1C 8.4 (H) 11/21/2018   HGBA1C 8.0 (H) 08/08/2018   HGBA1C 6.9 (H) 01/19/2018     Lipid Panel ( most recent) Lipid Panel     Component Value Date/Time   CHOL 115 11/21/2018 0950   TRIG 93 11/21/2018 0950   HDL 37 (L) 11/21/2018 0950   CHOLHDL 3.1 11/21/2018 0950   CHOLHDL 5.0 11/15/2014 1119   VLDL 25 11/15/2014 1119   LDLCALC 59 11/21/2018 0950      Lab Results  Component Value Date   TSH 1.680 11/21/2018   TSH 4.130 06/14/2017   TSH 1.392 05/14/2013   TSH 2.663 08/02/2012   TSH 2.552 04/20/2011   TSH 2.206 11/12/2010   TSH 0.182 (L) 08/11/2010   TSH 1.445 02/16/2010   TSH 2.784 01/07/2010   FREET4 1.08 11/21/2018   FREET4 1.00 10/21/2015   FREET4 0.81 11/12/2010   FREET4 0.93 08/11/2010   FREET4 1.08 02/16/2010   FREET4 0.74 (L) 01/07/2010           Assessment & Plan:   1. Uncontrolled type 2 diabetes mellitus with hyperglycemia ([place]) -she  remains at a high risk for more acute and chronic complications of diabetes which include CAD, CVA, CKD, retinopathy, and neuropathy. These are all discussed in detail with the patient.  Her recent labs were reviewed with her, and patient's most recent A1c was 8.4%.   - I have re-counseled the patient on diet management   by adopting a carbohydrate restricted / protein rich  Diet.  - Suggestion is made for patient to avoid simple carbohydrates   from their diet including Cakes , Desserts, Ice Cream,  Soda (  diet and regular) , Sweet Tea , Candies,  Chips, Cookies, Artificial Sweeteners,   and "Sugar-free" Products .  This will help patient to have stable blood  glucose profile and potentially avoid unintended  Weight gain.  - Patient is advised to stick to a routine mealtimes to eat 3 meals  a day and avoid unnecessary snacks ( to snack only to correct hypoglycemia).  - The patient has been  scheduled with Jearld Fenton, RDN, CDE for  individualized DM education.  - I have approached patient with the following individualized plan to manage diabetes and patient agrees.  -On her current glycemic burden and her history of intolerance to metformin, she will require insulin treatment.  He agrees to initiate basal insulin.  I discussed and initiated Tresiba 20 units nightly, associated with strict monitoring of blood glucose 2 times a day-daily before breakfast and at bedtime.   -I advised her to lower her glipizide to 10 mg XL p.o. only at breakfast.  -Patient is encouraged to call clinic for blood glucose levels less than 70 or above 200 mg /dl. -She reports intolerance to metformin.  2) Lipids/HPL: She is advised to continue Crestor 20 mg p.o. nightly.  - Time spent with the patient: 25 min, of which >50% was spent in reviewing her sugar logs , discussing her hypo- and hyper-glycemic episodes, reviewing  previous labs and insulin doses and developing a plan to avoid hypo- and hyper-glycemia.   - I advised patient to maintain close follow up with Fayrene Helper, MD for primary care needs.   Follow up plan: -Return in about 9 weeks (around 03/19/2019) for Follow up with Pre-visit Labs, Meter, and Logs.  Glade Lloyd, MD Phone: [phone]  Fax: [phone]   01/15/2019, 4:39 PM  This note was partially dictated with voice recognition software. Similar sounding words can be transcribed inadequately or may not  be corrected upon review.

## 2019-01-17 ENCOUNTER — Other Ambulatory Visit: Payer: Self-pay | Admitting: Family Medicine

## 2019-01-24 ENCOUNTER — Encounter: Payer: Self-pay | Admitting: Family Medicine

## 2019-01-25 ENCOUNTER — Other Ambulatory Visit: Payer: Self-pay

## 2019-01-25 MED ORDER — VITAMIN D (ERGOCALCIFEROL) 1.25 MG (50000 UNIT) PO CAPS: 50000.0000 [IU] | ORAL_CAPSULE | ORAL | 0 refills | Status: DC

## 2019-02-15 ENCOUNTER — Other Ambulatory Visit: Payer: Self-pay | Admitting: Family Medicine

## 2019-02-16 ENCOUNTER — Encounter: Payer: Self-pay | Admitting: Family Medicine

## 2019-02-16 ENCOUNTER — Other Ambulatory Visit: Payer: Self-pay

## 2019-02-16 MED ORDER — GLIPIZIDE ER 10 MG PO TB24: 10.0000 mg | ORAL_TABLET | Freq: Every day | ORAL | 1 refills | Status: DC

## 2019-03-12 ENCOUNTER — Telehealth: Payer: Self-pay | Admitting: Family Medicine

## 2019-03-12 ENCOUNTER — Other Ambulatory Visit: Payer: Self-pay | Admitting: "Endocrinology

## 2019-03-12 ENCOUNTER — Other Ambulatory Visit: Payer: Self-pay | Admitting: Family Medicine

## 2019-03-12 ENCOUNTER — Encounter: Payer: Self-pay | Admitting: Family Medicine

## 2019-03-12 MED ORDER — CYCLOBENZAPRINE HCL 10 MG PO TABS: ORAL_TABLET | ORAL | 0 refills | Status: DC

## 2019-03-12 NOTE — Telephone Encounter (Signed)
Only active orders are for Dr Dorris Fetch. Checked last visit and AVS and no future labs ordered. Pt aware

## 2019-03-12 NOTE — Telephone Encounter (Signed)
Pt is waiting on lab work to be placed in the system

## 2019-03-12 NOTE — Progress Notes (Signed)
Flexeril 10  

## 2019-03-13 ENCOUNTER — Other Ambulatory Visit: Payer: Self-pay | Admitting: Family Medicine

## 2019-03-14 LAB — COMPREHENSIVE METABOLIC PANEL
ALT: 18 IU/L (ref 0–32)
AST: 20 IU/L (ref 0–40)
Albumin/Globulin Ratio: 1.8 (ref 1.2–2.2)
Albumin: 4.4 g/dL (ref 3.8–4.9)
Alkaline Phosphatase: 89 IU/L (ref 39–117)
BUN/Creatinine Ratio: 13 (ref 9–23)
BUN: 14 mg/dL (ref 6–24)
Bilirubin Total: 0.6 mg/dL (ref 0.0–1.2)
CO2: 22 mmol/L (ref 20–29)
Calcium: 9.5 mg/dL (ref 8.7–10.2)
Chloride: 103 mmol/L (ref 96–106)
Creatinine, Ser: 1.05 mg/dL — ABNORMAL HIGH (ref 0.57–1.00)
GFR calc Af Amer: 67 mL/min/{1.73_m2} (ref >59)
GFR calc non Af Amer: 58 mL/min/{1.73_m2} — ABNORMAL LOW (ref >59)
Globulin, Total: 2.5 g/dL (ref 1.5–4.5)
Glucose: 104 mg/dL — ABNORMAL HIGH (ref 65–99)
Potassium: 4.3 mmol/L (ref 3.5–5.2)
Sodium: 142 mmol/L (ref 134–144)
Total Protein: 6.9 g/dL (ref 6.0–8.5)

## 2019-03-14 LAB — HGB A1C W/O EAG: Hgb A1c MFr Bld: 8.3 % — ABNORMAL HIGH (ref 4.8–5.6)

## 2019-03-14 LAB — SPECIMEN STATUS REPORT

## 2019-03-19 ENCOUNTER — Ambulatory Visit: Payer: BLUE CROSS/BLUE SHIELD | Admitting: "Endocrinology

## 2019-03-20 ENCOUNTER — Encounter: Payer: Self-pay | Admitting: Family Medicine

## 2019-03-21 ENCOUNTER — Ambulatory Visit (INDEPENDENT_AMBULATORY_CARE_PROVIDER_SITE_OTHER): Payer: BC Managed Care – PPO | Admitting: "Endocrinology

## 2019-03-21 ENCOUNTER — Other Ambulatory Visit: Payer: Self-pay | Admitting: Family Medicine

## 2019-03-21 ENCOUNTER — Other Ambulatory Visit: Payer: Self-pay

## 2019-03-21 ENCOUNTER — Encounter: Payer: Self-pay | Admitting: "Endocrinology

## 2019-03-21 DIAGNOSIS — E782 Mixed hyperlipidemia: Secondary | ICD-10-CM | POA: Diagnosis not present

## 2019-03-21 DIAGNOSIS — I10 Essential (primary) hypertension: Secondary | ICD-10-CM | POA: Diagnosis not present

## 2019-03-21 DIAGNOSIS — E1165 Type 2 diabetes mellitus with hyperglycemia: Secondary | ICD-10-CM | POA: Diagnosis not present

## 2019-03-21 MED ORDER — TRESIBA FLEXTOUCH 100 UNIT/ML ~~LOC~~ SOPN: 30.0000 [IU] | PEN_INJECTOR | Freq: Every day | SUBCUTANEOUS | 3 refills | Status: DC

## 2019-03-21 NOTE — Progress Notes (Signed)
Endocrinology Telehealth Visit Follow up Note -During COVID -19 Pandemic 03/21/2019  This visit type was conducted due to national recommendations for restrictions regarding the COVID-19 Pandemic  in an effort to limit this patient's exposure and mitigate transmission of the corona virus.  Due to her co-morbid illnesses, Annette [name] is at  moderate to high risk for complications without adequate follow up.  This format is felt to be most appropriate for her at this time.  I connected with this patient on 03/21/2019   by telephone and verified that I am speaking with the correct person using two identifiers. Annette [name], 1961-01-25. she has verbally consented to this visit. All issues noted in this document were discussed and addressed. The format was not optimal for physical exam.   Subjective:    Patient ID: Annette [name], female    DOB: [birth year], PCP Fayrene Helper, MD   Past Medical History:  Diagnosis Date  . Breast cancer ([place]) 2007   History of  XRT, onTamoxifen  . Bruises easily   . Depression   . Diabetes mellitus    prediabetic  . GERD (gastroesophageal reflux disease)   . Hypertriglyceridemia   . Hypothyroidism    following chemo amnd radiaition for breast cancer, needed replacement short term  . Hypothyroidism (acquired)    replaced x 1 year  . IBS (irritable bowel syndrome)   . Menieres disease    Controlled with triamterene   . Migraines   . Obesity   . Palpitations   . 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[place])   . Sleep apnea 2010   Problems with CPAP   Past Surgical History:  Procedure Laterality Date  . ABDOMINAL HYSTERECTOMY  1998   Benign, fibroids  . 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 APPENDECTOMY  06/12/08  . BREAST RECONSTRUCTION     Left reconstructive  . BREAST SURGERY  2007   Left lumpectomy  . BREAST SURGERY     [facility] - right side  .  CHOLECYSTECTOMY  1985  . ESOPHAGEAL DILATION  12/19/2015   Procedure: ESOPHAGEAL DILATION;  Surgeon: Rogene Houston, MD;  Location: [facility];  Service: Endoscopy;;  . ESOPHAGOGASTRODUODENOSCOPY N/A 12/19/2015   Procedure: ESOPHAGOGASTRODUODENOSCOPY (EGD);  Surgeon: Rogene Houston, MD;  Location: [facility];  Service: Endoscopy;  Laterality: N/A;  11:40  . 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 THYROIDECTOMY, PARTIAL  11/05/2010   Benign disease   Social History   Socioeconomic History  . Marital status: Married    Spouse name: Not on file  . Number of children: Not on file  . Years of education: Not on file  . Highest education level: Not on file  Occupational History  . Not on file  Social Needs  . Financial resource strain: Not on file  . Food insecurity    Worry: Not on file    Inability: Not on file  . Transportation needs    Medical: Not on file    Non-medical: Not on file  Tobacco Use  . Smoking status: Current Every Day Smoker    Packs/day: 0.30  Years: 0.50    Pack years: 0.15    Types: Cigarettes  . Smokeless tobacco: Never Used  . Tobacco comment: 1/2 pack a day  Substance and Sexual Activity  . Alcohol use: No    Alcohol/week: 0.0 standard drinks    Comment: Encouraged to quit smoking. She has tried the nicotone gum and patches but did not help  . Drug use: No  . Sexual activity: Yes    Birth control/protection: Surgical  Lifestyle  . Physical activity    Days per week: Not on file    Minutes per session: Not on file  . Stress: Not on file  Relationships  . Social Herbalist on phone: Not on file    Gets together: Not on file    Attends religious service: Not on file    Active member of club or organization: Not on file    Attends meetings of clubs or organizations: Not on file    Relationship status: Not on file  Other Topics Concern  . Not on file  Social History Narrative  . Not  on file   Family History  Problem Relation Age of Onset  . Diabetes Mother   . Heart disease Father   . Cancer Paternal Grandmother        Breast   Outpatient Encounter Medications as of 03/21/2019  Medication Sig  . blood glucose meter kit and supplies Dispense based on patient and insurance preference. Use up to four times daily as directed. (FOR ICD-10 E10.9, E11.9).  . clotrimazole-betamethasone (LOTRISONE) cream Apply topically as needed.  . cyclobenzaprine (FLEXERIL) 10 MG tablet TAKE 1 TABLET BY MOUTH AT NIGHT AS NEEDED FOR MUSCLE SPASM  . esomeprazole (NEXIUM) 40 MG capsule TAKE 1 CAPSULE BY MOUTH EVERY DAY  . glipiZIDE (GLUCOTROL XL) 10 MG 24 hr tablet Take 1 tablet (10 mg total) by mouth daily with breakfast.  . insulin degludec (TRESIBA FLEXTOUCH) 100 UNIT/ML SOPN FlexTouch Pen Inject 0.3 mLs (30 Units total) into the skin daily with breakfast.  . Insulin Pen Needle (B-D ULTRAFINE III SHORT PEN) 31G X 8 MM MISC 1 each by Does not apply route as directed.  Marland Kitchen LORazepam (ATIVAN) 1 MG tablet TAKE ONE TABLET BY MOUTH EVERY 8 HOURS AS NEEDED FOR ANXIETY AND/OR NAUSEA  . Metoprolol Tartrate 37.5 MG TABS Take 1 tablet by mouth 2 (two) times daily.  . naproxen (NAPROSYN) 500 MG tablet Take 1 tablet (500 mg total) by mouth 2 (two) times daily with a meal.  . NON FORMULARY [facility]  Scar Cream -  Verapamil 10%, Pentoxifylline 5% Apply 1-2 grams to affected area 3-4 times daily Qty. 120 gm 3 refills  . ONETOUCH VERIO test strip USE TO TEST UP TO 4 TIMES PER DAY  . rosuvastatin (CRESTOR) 20 MG tablet Take 1 tablet (20 mg total) by mouth daily.  . temazepam (RESTORIL) 15 MG capsule TAKE ONE CAPSULE BY MOUTH EVERY NIGHT AT BEDTIME AS NEEDED  . traZODone (DESYREL) 100 MG tablet Take 1 tablet (100 mg total) by mouth at bedtime.  . triamterene-hydrochlorothiazide (MAXZIDE-25) 37.5-25 MG tablet Take one half tablet by mouth once daily  . venlafaxine XR (EFFEXOR-XR) 75 MG 24 hr capsule  Take 1 capsule (75 mg total) by mouth daily.  . Vitamin D, Ergocalciferol, (DRISDOL) 1.25 MG (50000 UT) CAPS capsule Take 1 capsule (50,000 Units total) by mouth every 7 (seven) days. 1 capsule by mouth weekly  . [DISCONTINUED] insulin degludec (TRESIBA FLEXTOUCH)  100 UNIT/ML SOPN FlexTouch Pen Inject 0.2 mLs (20 Units total) into the skin daily.   No facility-administered encounter medications on file as of 03/21/2019.    ALLERGIES: Allergies  Allergen Reactions  . Penicillins Swelling    Has patient had a PCN reaction causing immediate rash, facial/tongue/throat swelling, SOB or lightheadedness with hypotension: Yes Has patient had a PCN reaction causing severe rash involving mucus membranes or skin necrosis: No Has patient had a PCN reaction that required hospitalization: No  Has patient had a PCN reaction occurring within the last 10 years: No If all of the above answers are "NO", then may proceed with Cephalosporin use.   Of face and tongue.  . Metformin And Related Nausea Only    Pt to discontinue med due to intolerance, cramps and diarrhea  . Statins Other (See Comments)    Major concerns re safety  . Tape Itching    Surgical adhesive  . Zetia [Ezetimibe] Other (See Comments)    Feels funny   VACCINATION STATUS: Immunization History  Administered Date(s) Administered  . Influenza Split 08/14/2012, 07/23/2016  . Influenza,inj,Quad PF,6+ Mos 07/23/2013, 07/16/2014, 07/16/2015  . Meningococcal Conjugate 08/30/2018  . Meningococcal Polysaccharide 02/26/2012  . Pneumococcal Conjugate-13 07/25/2018  . Pneumococcal Polysaccharide-23 08/04/2007, 02/26/2012  . Td 08/04/2007    HPI This is a 58 year old female patient who is being engaged in telehealth for follow-up of type 2 diabetes, hyperlipidemia, hypertension.   -She was initiated on Tresiba 20 units nightly along with glipizide 10 mg p.o. daily.  She declined metformin treatment  citing family history of side effects from  metformin. -Her previsit labs show A1c of 8.3% unchanged from her last visit A1c.  She reports in her symptoms of polydipsia and polyuria.  She has no new complaints today.  Review of Systems  Objective:    There were no vitals taken for this visit.  Wt Readings from Last 3 Encounters:  01/08/19 217 lb (98.4 kg)  11/21/18 216 lb 1.9 oz (98 kg)  08/30/18 215 lb 12.8 oz (97.9 kg)    Physical Exam   CMP ( most recent) CMP     Component Value Date/Time   NA 142 03/12/2019 1414   K 4.3 03/12/2019 1414   CL 103 03/12/2019 1414   CO2 22 03/12/2019 1414   GLUCOSE 104 (H) 03/12/2019 1414   GLUCOSE 115 (H) 05/31/2017 2141   BUN 14 03/12/2019 1414   CREATININE 1.05 (H) 03/12/2019 1414   CREATININE 0.74 11/15/2014 1119   CALCIUM 9.5 03/12/2019 1414   PROT 6.9 03/12/2019 1414   ALBUMIN 4.4 03/12/2019 1414   AST 20 03/12/2019 1414   ALT 18 03/12/2019 1414   ALKPHOS 89 03/12/2019 1414   BILITOT 0.6 03/12/2019 1414   GFRNONAA 58 (L) 03/12/2019 1414   GFRNONAA 81 11/26/2013 1437   GFRAA 67 03/12/2019 1414   GFRAA >89 11/26/2013 1437     Diabetic Labs (most recent): Lab Results  Component Value Date   HGBA1C 8.3 (H) 03/12/2019   HGBA1C 8.4 (H) 11/21/2018   HGBA1C 8.0 (H) 08/08/2018     Lipid Panel ( most recent) Lipid Panel     Component Value Date/Time   CHOL 115 11/21/2018 0950   TRIG 93 11/21/2018 0950   HDL 37 (L) 11/21/2018 0950   CHOLHDL 3.1 11/21/2018 0950   CHOLHDL 5.0 11/15/2014 1119   VLDL 25 11/15/2014 1119   LDLCALC 59 11/21/2018 0950      Lab Results  Component Value Date  TSH 1.680 11/21/2018   TSH 4.130 06/14/2017   TSH 1.392 05/14/2013   TSH 2.663 08/02/2012   TSH 2.552 04/20/2011   TSH 2.206 11/12/2010   TSH 0.182 (L) 08/11/2010   TSH 1.445 02/16/2010   TSH 2.784 01/07/2010   FREET4 1.08 11/21/2018   FREET4 1.00 10/21/2015   FREET4 0.81 11/12/2010   FREET4 0.93 08/11/2010   FREET4 1.08 02/16/2010   FREET4 0.74 (L) 01/07/2010        Assessment & Plan:   1. Uncontrolled type 2 diabetes mellitus with hyperglycemia ([place]) -she  remains at a high risk for more acute and chronic complications of diabetes which include CAD, CVA, CKD, retinopathy, and neuropathy. These are all discussed in detail with the patient. She reports blood glucose readings from 122 to 202 mg per DL in the morning. Her recent labs were reviewed with her, and patient's most recent A1c was 8.3%.   - I have re-counseled the patient on diet management   by adopting a carbohydrate restricted / protein rich  Diet.  - she  admits there is a room for improvement in her diet and drink choices. -  Suggestion is made for her to avoid simple carbohydrates  from her diet including Cakes, Sweet Desserts / Pastries, Ice Cream, Soda (diet and regular), Sweet Tea, Candies, Chips, Cookies, Sweet Pastries,  Store Bought Juices, Alcohol in Excess of  1-2 drinks a day, Artificial Sweeteners, Coffee Creamer, and "Sugar-free" Products. This will help patient to have stable blood glucose profile and potentially avoid unintended weight gain.    - Patient is advised to stick to a routine mealtimes to eat 3 meals  a day and avoid unnecessary snacks ( to snack only to correct hypoglycemia).  - The patient has been  scheduled with Jearld Fenton, RDN, CDE for individualized DM education.  - I have approached patient with the following individualized plan to manage diabetes and patient agrees.  -Based on her current response, she will tolerate a higher dose of basal insulin.  I discussed and increased her Tyler Aas to 30 units [place] breakfast along with her glipizide 10 mg p.o. daily.    -She is willing to continue monitoring blood glucose twice a day-daily before breakfast and at bedtime. -I advised her to lower her glipizide to 10 mg XL p.o. only at breakfast.  -Patient is encouraged to call clinic for blood glucose levels less than 70 or above 200 mg /dl.   2)  Lipids/HPL: She is advised to continue Crestor 20 mg p.o. nightly, side effects and precautions discussed with her.    3.  Hypertension, advised to monitor blood pressure at home and call back if readings are greater than 140/90 on 2 separate occasions.  She is advised to continue metoprolol 25 mg p.o. twice daily.   - Time spent with the patient: 25 min, of which >50% was spent in reviewing her  current and  previous labs/studies, her blood glucose readings, previous treatments, and medications doses and developing a plan for long-term care based on the latest recommendations for standards of care. Please refer to " Patient Self Inventory" in the Media  tab for reviewed elements of pertinent patient history. Annette [name] participated in the discussions, expressed understanding, and voiced agreement with the above plans.  All questions were answered to her satisfaction. she is encouraged to contact clinic should she have any questions or concerns prior to her return visit.   - I advised patient to maintain close follow  up with Fayrene Helper, MD for primary care needs.   Follow up plan: -Return in about 4 months (around 07/21/2019) for Follow up with Pre-visit Labs, Meter, and Logs.  Glade Lloyd, MD Phone: [phone]  Fax: [phone]   03/21/2019, 5:25 PM  This note was partially dictated with voice recognition software. Similar sounding words can be transcribed inadequately or may not  be corrected upon review.

## 2019-03-22 ENCOUNTER — Ambulatory Visit (INDEPENDENT_AMBULATORY_CARE_PROVIDER_SITE_OTHER): Payer: BC Managed Care – PPO | Admitting: Family Medicine

## 2019-03-22 ENCOUNTER — Other Ambulatory Visit: Payer: Self-pay

## 2019-03-22 ENCOUNTER — Encounter: Payer: Self-pay | Admitting: Family Medicine

## 2019-03-22 ENCOUNTER — Other Ambulatory Visit: Payer: Self-pay | Admitting: Family Medicine

## 2019-03-22 VITALS — BP 110/60 | Ht 66.0 in | Wt 217.0 lb

## 2019-03-22 DIAGNOSIS — E785 Hyperlipidemia, unspecified: Secondary | ICD-10-CM

## 2019-03-22 DIAGNOSIS — M5442 Lumbago with sciatica, left side: Secondary | ICD-10-CM

## 2019-03-22 DIAGNOSIS — F17218 Nicotine dependence, cigarettes, with other nicotine-induced disorders: Secondary | ICD-10-CM | POA: Diagnosis not present

## 2019-03-22 DIAGNOSIS — Z794 Long term (current) use of insulin: Secondary | ICD-10-CM

## 2019-03-22 DIAGNOSIS — R42 Dizziness and giddiness: Secondary | ICD-10-CM

## 2019-03-22 DIAGNOSIS — E1169 Type 2 diabetes mellitus with other specified complication: Secondary | ICD-10-CM

## 2019-03-22 DIAGNOSIS — E669 Obesity, unspecified: Secondary | ICD-10-CM | POA: Diagnosis not present

## 2019-03-22 DIAGNOSIS — I10 Essential (primary) hypertension: Secondary | ICD-10-CM

## 2019-03-22 DIAGNOSIS — G8929 Other chronic pain: Secondary | ICD-10-CM

## 2019-03-22 MED ORDER — VITAMIN D (ERGOCALCIFEROL) 1.25 MG (50000 UNIT) PO CAPS: 50000.0000 [IU] | ORAL_CAPSULE | ORAL | 3 refills | Status: DC

## 2019-03-22 MED ORDER — LORAZEPAM 1 MG PO TABS: ORAL_TABLET | ORAL | 5 refills | Status: DC

## 2019-03-22 MED ORDER — TEMAZEPAM 15 MG PO CAPS: ORAL_CAPSULE | ORAL | 5 refills | Status: DC

## 2019-03-22 NOTE — Patient Instructions (Addendum)
annuial exam in Novemeb r, call if you need me before  Please get X rays of your mid and lower back on 06/22/202 when you go for the ultrasound of your neck arteries, check in at 1: 15  You will be referred to Dr Cyndy Freeze if studies show you need to see a neurosurgeon  I recommend tylenol 500 mg one to two  Daily for back pain  Npow smoking 5 to 10 cigarettes daily, please continue to work on Quitting, you are making progvress  Please  Eat on a regular schedule soi that you are healthy and to gain control of your blood sugar  Thanks for choosing [facility], we consider it a privelige to serve you.   Social distancing.avoid crowds Frequent hand washing with soap and water Keeping your hands off of your face.Wear a mask in public These 3 practices will help to keep both you and your community healthy during this time. Please practice them faithfully!

## 2019-03-22 NOTE — Progress Notes (Signed)
Virtual Visit via Telephone Note  I connected with Annette [name] on 03/22/19 at  1:20 PM EDT by telephone and verified that I am speaking with the correct person using two identifiers.  Location: Patient: home Provider: office   I discussed the limitations, risks, security and privacy concerns of performing an evaluation and management service by telephone and the availability of in person appointments. I also discussed with the patient that there may be a patient responsible charge related to this service. The patient expressed understanding and agreed to proceed.   History of Present Illness: Twice daily testing , evening sugar is still up to 300, morning blood sugar is about 200 Cannot walk because when she walks has back pain and abdominal bloating, pain up to 10,pain when sitting a 3, standing over 5 mins , 9 to 10, pain is in mid and low back , radiates around  abdomen and down both thighs to knees  Denies recent fever or chills. Denies sinus pressure, nasal congestion, ear pain or sore throat. Denies chest congestion, productive cough or wheezing. Denies chest pains, palpitations and leg swelling Denies abdominal pain, nausea, vomiting,diarrhea or constipation.   Denies dysuria, frequency, hesitancy or incontinence.   Denies  Uncontrolled depression, anxiety or insomnia. Denies skin break down or rash.     Observations/Objective:  BP 110/60   Ht 5\' 6"  (1.676 m)   Wt 217 lb (98.4 kg)   BMI 35.02 kg/m  Good communication with no confusion and intact memory. Alert and oriented x 3 No signs of respiratory distress during speech   Assessment and Plan: Nicotine dependence Asked:confirms currently smokes cigarettes Assess: Unwilling to quit but cutting back Advise: needs to QUIT to reduce risk of cancer, cardio and cerebrovascular disease Assist: counseled for 5 minutes and literature provided Arrange: follow up in 3 months   Obesity (BMI 30.0-34.9) Obesity  linked wit hypertension and diabetes Patient re-educated about  the importance of commitment to a  minimum of 150 minutes of exercise per week as able.  The importance of healthy food choices with portion control discussed, as well as eating regularly and within a 12 hour window most days. The need to choose "clean , green" food 50 to 75% of the time is discussed, as well as to make water the primary drink and set a goal of 64 ounces water daily.    Weight /BMI 03/22/2019 01/08/2019 11/21/2018  WEIGHT 217 lb 217 lb 216 lb 1.9 oz  HEIGHT 5\' 6"  5\' 6"  5\' 6"   BMI 35.02 kg/m2 35.02 kg/m2 34.88 kg/m2      Morbid obesity ([place]) Obesity linked with hypertension and dibetes  Patient re-educated about  the importance of commitment to a  minimum of 150 minutes of exercise per week as able.  The importance of healthy food choices with portion control discussed, as well as eating regularly and within a 12 hour window most days. The need to choose "clean , green" food 50 to 75% of the time is discussed, as well as to make water the primary drink and set a goal of 64 ounces water daily.    Weight /BMI 03/22/2019 01/08/2019 11/21/2018  WEIGHT 217 lb 217 lb 216 lb 1.9 oz  HEIGHT 5\' 6"  5\' 6"  5\' 6"   BMI 35.02 kg/m2 35.02 kg/m2 34.88 kg/m2      Light headedness Has carotid doppler scheduled  Type 2 diabetes mellitus with other specified complication ([place]) Uncontrolled , naged by Endo Annette [name] is reminded of the  importance of commitment to daily physical activity for 30 minutes or more, as able and the need to limit carbohydrate intake to 30 to 60 grams per meal to help with blood sugar control.   The need to take medication as prescribed, test blood sugar as directed, and to call between visits if there is a concern that blood sugar is uncontrolled is also discussed.   Annette [name] is reminded of the importance of daily foot exam, annual eye examination, and good blood sugar, blood pressure and  cholesterol control.  Diabetic Labs Latest Ref Rng & Units 03/12/2019 11/21/2018 08/08/2018 01/19/2018 09/16/2017  HbA1c 4.8 - 5.6 % 8.3(H) 8.4(H) 8.0(H) 6.9(H) -  Microalbumin Not Estab. ug/mL - - - - <3.0(H)  Micro/Creat Ratio 0.0 - 30.0 mg/g creat - - - - <6.0  Chol 100 - 199 mg/dL - 115 138 218(H) -  HDL >39 mg/dL - 37(L) 42 38(L) -  Calc LDL 0 - 99 mg/dL - 59 76 158(H) -  Triglycerides 0 - 149 mg/dL - 93 98 112 -  Creatinine 0.57 - 1.00 mg/dL 1.05(H) 1.01(H) 0.87 1.07(H) -   BP/Weight 03/22/2019 01/08/2019 11/21/2018 08/30/2018 07/25/2018 02/16/2018 6/54/6503  Systolic BP 546 568 127 517 001 749 449  Diastolic BP 60 60 60 52 66 62 70  Wt. (Lbs) 217 217 216.12 215.8 215.12 219 221  BMI 35.02 35.02 34.88 33.8 33.69 34.3 35.67   Foot/eye exam completion dates 07/15/2014  Foot Form Completion Done        Hyperlipidemia LDL goal <100 Hyperlipidemia:Low fat diet discussed and encouraged.   Lipid Panel  Lab Results  Component Value Date   CHOL 115 11/21/2018   HDL 37 (L) 11/21/2018   LDLCALC 59 11/21/2018   TRIG 93 11/21/2018   CHOLHDL 3.1 11/21/2018   Needs to commit to regular exercise    HTN, goal below 130/80 Controlled, no change in medication DASH diet and commitment to daily physical activity for a minimum of 30 minutes discussed and encouraged, as a part of hypertension management. The importance of attaining a healthy weight is also discussed.  BP/Weight 03/22/2019 01/08/2019 11/21/2018 08/30/2018 07/25/2018 02/16/2018 6/75/9163  Systolic BP 846 659 935 701 779 390 300  Diastolic BP 60 60 60 52 66 62 70  Wt. (Lbs) 217 217 216.12 215.8 215.12 219 221  BMI 35.02 35.02 34.88 33.8 33.69 34.3 35.67       Back pain Progressively debilitating, mid and low back pain, will ger X ray  Then follow up from there    Follow Up Instructions:    I discussed the assessment and treatment plan with the patient. The patient was provided an opportunity to ask questions and all  were answered. The patient agreed with the plan and demonstrated an understanding of the instructions.   The patient was advised to call back or seek an in-person evaluation if the symptoms worsen or if the condition fails to improve as anticipated.  I provided 25 minutes of non-face-to-face time during this encounter.   [name], MD

## 2019-03-24 ENCOUNTER — Other Ambulatory Visit (HOSPITAL_COMMUNITY): Payer: Self-pay | Admitting: Nurse Practitioner

## 2019-03-24 DIAGNOSIS — Z853 Personal history of malignant neoplasm of breast: Secondary | ICD-10-CM

## 2019-03-25 ENCOUNTER — Encounter: Payer: Self-pay | Admitting: Family Medicine

## 2019-03-25 DIAGNOSIS — M549 Dorsalgia, unspecified: Secondary | ICD-10-CM | POA: Insufficient documentation

## 2019-03-25 NOTE — Assessment & Plan Note (Signed)
Hyperlipidemia:Low fat diet discussed and encouraged.   Lipid Panel  Lab Results  Component Value Date   CHOL 115 11/21/2018   HDL 37 (L) 11/21/2018   LDLCALC 59 11/21/2018   TRIG 93 11/21/2018   CHOLHDL 3.1 11/21/2018   Needs to commit to regular exercise

## 2019-03-25 NOTE — Assessment & Plan Note (Signed)
Has carotid doppler scheduled

## 2019-03-25 NOTE — Assessment & Plan Note (Signed)
Uncontrolled , naged by [name] is reminded of the importance of commitment to daily physical activity for 30 minutes or more, as able and the need to limit carbohydrate intake to 30 to 60 grams per meal to help with blood sugar control.   The need to take medication as prescribed, test blood sugar as directed, and to call between visits if there is a concern that blood sugar is uncontrolled is also discussed.   Annette [name] is reminded of the importance of daily foot exam, annual eye examination, and good blood sugar, blood pressure and cholesterol control.  Diabetic Labs Latest Ref Rng & Units 03/12/2019 11/21/2018 08/08/2018 01/19/2018 09/16/2017  HbA1c 4.8 - 5.6 % 8.3(H) 8.4(H) 8.0(H) 6.9(H) -  Microalbumin Not Estab. ug/mL - - - - <3.0(H)  Micro/Creat Ratio 0.0 - 30.0 mg/g creat - - - - <6.0  Chol 100 - 199 mg/dL - 115 138 218(H) -  HDL >39 mg/dL - 37(L) 42 38(L) -  Calc LDL 0 - 99 mg/dL - 59 76 158(H) -  Triglycerides 0 - 149 mg/dL - 93 98 112 -  Creatinine 0.57 - 1.00 mg/dL 1.05(H) 1.01(H) 0.87 1.07(H) -   BP/Weight 03/22/2019 01/08/2019 11/21/2018 08/30/2018 07/25/2018 02/16/2018 6/55/3748  Systolic BP 270 786 754 492 010 071 219  Diastolic BP 60 60 60 52 66 62 70  Wt. (Lbs) 217 217 216.12 215.8 215.12 219 221  BMI 35.02 35.02 34.88 33.8 33.69 34.3 35.67   Foot/eye exam completion dates 07/15/2014  Foot Form Completion Done

## 2019-03-25 NOTE — Assessment & Plan Note (Signed)
Asked:confirms currently smokes cigarettes Assess: Unwilling to quit but cutting back Advise: needs to QUIT to reduce risk of cancer, cardio and cerebrovascular disease Assist: counseled for 5 minutes and literature provided Arrange: follow up in 3 months  

## 2019-03-25 NOTE — Assessment & Plan Note (Signed)
Controlled, no change in medication DASH diet and commitment to daily physical activity for a minimum of 30 minutes discussed and encouraged, as a part of hypertension management. The importance of attaining a healthy weight is also discussed.  BP/Weight 03/22/2019 01/08/2019 11/21/2018 08/30/2018 07/25/2018 02/16/2018 0/34/7425  Systolic BP 956 387 564 332 951 884 166  Diastolic BP 60 60 60 52 66 62 70  Wt. (Lbs) 217 217 216.12 215.8 215.12 219 221  BMI 35.02 35.02 34.88 33.8 33.69 34.3 35.67

## 2019-03-25 NOTE — Assessment & Plan Note (Signed)
Progressively debilitating, mid and low back pain, will ger X ray  Then follow up from there

## 2019-03-25 NOTE — Assessment & Plan Note (Signed)
Obesity linked with hypertension and dibetes  Patient re-educated about  the importance of commitment to a  minimum of 150 minutes of exercise per week as able.  The importance of healthy food choices with portion control discussed, as well as eating regularly and within a 12 hour window most days. The need to choose "clean , green" food 50 to 75% of the time is discussed, as well as to make water the primary drink and set a goal of 64 ounces water daily.    Weight /BMI 03/22/2019 01/08/2019 11/21/2018  WEIGHT 217 lb 217 lb 216 lb 1.9 oz  HEIGHT 5\' 6"  5\' 6"  5\' 6"   BMI 35.02 kg/m2 35.02 kg/m2 34.88 kg/m2

## 2019-03-25 NOTE — Assessment & Plan Note (Addendum)
Obesity linked wit hypertension and diabetes Patient re-educated about  the importance of commitment to a  minimum of 150 minutes of exercise per week as able.  The importance of healthy food choices with portion control discussed, as well as eating regularly and within a 12 hour window most days. The need to choose "clean , green" food 50 to 75% of the time is discussed, as well as to make water the primary drink and set a goal of 64 ounces water daily.    Weight /BMI 03/22/2019 01/08/2019 11/21/2018  WEIGHT 217 lb 217 lb 216 lb 1.9 oz  HEIGHT 5\' 6"  5\' 6"  5\' 6"   BMI 35.02 kg/m2 35.02 kg/m2 34.88 kg/m2

## 2019-03-26 ENCOUNTER — Ambulatory Visit (HOSPITAL_COMMUNITY)
Admission: RE | Admit: 2019-03-26 | Discharge: 2019-03-26 | Disposition: A | Discharge status: Home / Self Care | Payer: BC Managed Care – PPO | Source: Ambulatory Visit | Attending: Family Medicine | Admitting: Family Medicine

## 2019-03-26 ENCOUNTER — Other Ambulatory Visit: Payer: Self-pay

## 2019-03-26 DIAGNOSIS — R42 Dizziness and giddiness: Secondary | ICD-10-CM | POA: Diagnosis present

## 2019-03-27 ENCOUNTER — Encounter: Payer: Self-pay | Admitting: Family Medicine

## 2019-03-27 ENCOUNTER — Telehealth: Payer: Self-pay | Admitting: "Endocrinology

## 2019-03-27 NOTE — Telephone Encounter (Signed)
Patient left a VM and wanted to know if Dr Dorris Fetch was going to prescribe her dexcom. She said she did some research and Georgina Quint has a side effect of weight gain.

## 2019-03-27 NOTE — Telephone Encounter (Signed)
Forwarding to Dr. Dorris Fetch

## 2019-04-18 ENCOUNTER — Telehealth: Payer: Self-pay | Admitting: "Endocrinology

## 2019-04-18 ENCOUNTER — Other Ambulatory Visit: Payer: Self-pay

## 2019-04-18 MED ORDER — INSULIN GLARGINE (1 UNIT DIAL) 300 UNIT/ML ~~LOC~~ SOPN: 30.0000 [IU] | PEN_INJECTOR | Freq: Every day | SUBCUTANEOUS | 2 refills | Status: DC

## 2019-04-18 NOTE — Telephone Encounter (Signed)
PATIENT NEEDS A PA ON insulin degludec (TRESIBA FLEXTOUCH) 100 UNIT/ML SOPN FlexTouch Pen

## 2019-04-18 NOTE — Telephone Encounter (Signed)
Forwarding to [employer] for PA

## 2019-04-18 NOTE — Telephone Encounter (Signed)
Sent Toujeo to pts pharmacy

## 2019-04-23 ENCOUNTER — Telehealth: Payer: Self-pay

## 2019-04-23 ENCOUNTER — Other Ambulatory Visit: Payer: Self-pay | Admitting: "Endocrinology

## 2019-04-23 NOTE — Telephone Encounter (Signed)
Routing to Dr Nida 

## 2019-04-23 NOTE — Telephone Encounter (Signed)
error 

## 2019-05-17 ENCOUNTER — Telehealth: Payer: Self-pay | Admitting: Family Medicine

## 2019-05-17 ENCOUNTER — Ambulatory Visit: Payer: BLUE CROSS/BLUE SHIELD | Admitting: "Endocrinology

## 2019-05-17 DIAGNOSIS — N6320 Unspecified lump in the left breast, unspecified quadrant: Secondary | ICD-10-CM

## 2019-05-17 NOTE — Telephone Encounter (Signed)
Pt LVm that she had to cxl her Mammo appt, and that when she called to [facility], they told her she needed a DX mammogram, as she has a lump under her Left Breast. Please leave msg that it is completed  [phone]

## 2019-05-17 NOTE — Telephone Encounter (Signed)
Do you want to change the order to diagnostic or want to see her first?

## 2019-05-17 NOTE — Telephone Encounter (Signed)
pls change order , I do not need to see her , she reports a lump, send her a message when re ordered , tx

## 2019-05-21 ENCOUNTER — Ambulatory Visit (HOSPITAL_COMMUNITY): Payer: BLUE CROSS/BLUE SHIELD

## 2019-05-21 NOTE — Telephone Encounter (Signed)
Changed and made pt aware via mychart

## 2019-05-21 NOTE — Addendum Note (Signed)
Addended by: Eual Fines on: 05/21/2019 11:08 AM   Modules accepted: Orders

## 2019-05-23 ENCOUNTER — Other Ambulatory Visit: Payer: Self-pay | Admitting: Family Medicine

## 2019-05-23 ENCOUNTER — Encounter: Payer: Self-pay | Admitting: Family Medicine

## 2019-05-23 DIAGNOSIS — R001 Bradycardia, unspecified: Secondary | ICD-10-CM

## 2019-05-23 DIAGNOSIS — Z9189 Other specified personal risk factors, not elsewhere classified: Secondary | ICD-10-CM

## 2019-05-23 NOTE — Progress Notes (Signed)
amb card

## 2019-06-04 ENCOUNTER — Encounter (HOSPITAL_COMMUNITY): Payer: Self-pay | Admitting: Emergency Medicine

## 2019-06-04 ENCOUNTER — Ambulatory Visit (HOSPITAL_COMMUNITY): Payer: BC Managed Care – PPO

## 2019-06-04 ENCOUNTER — Emergency Department (HOSPITAL_COMMUNITY)
Admission: EM | Admit: 2019-06-04 | Discharge: 2019-06-04 | Disposition: A | Discharge status: Home / Self Care | Payer: BC Managed Care – PPO | Attending: Emergency Medicine | Admitting: Emergency Medicine

## 2019-06-04 ENCOUNTER — Other Ambulatory Visit: Payer: Self-pay

## 2019-06-04 DIAGNOSIS — H109 Unspecified conjunctivitis: Secondary | ICD-10-CM

## 2019-06-04 DIAGNOSIS — Z7984 Long term (current) use of oral hypoglycemic drugs: Secondary | ICD-10-CM | POA: Diagnosis not present

## 2019-06-04 DIAGNOSIS — E039 Hypothyroidism, unspecified: Secondary | ICD-10-CM | POA: Insufficient documentation

## 2019-06-04 DIAGNOSIS — H1031 Unspecified acute conjunctivitis, right eye: Secondary | ICD-10-CM | POA: Insufficient documentation

## 2019-06-04 DIAGNOSIS — E119 Type 2 diabetes mellitus without complications: Secondary | ICD-10-CM | POA: Diagnosis not present

## 2019-06-04 DIAGNOSIS — I10 Essential (primary) hypertension: Secondary | ICD-10-CM | POA: Diagnosis not present

## 2019-06-04 DIAGNOSIS — Z79899 Other long term (current) drug therapy: Secondary | ICD-10-CM | POA: Diagnosis not present

## 2019-06-04 DIAGNOSIS — Z7982 Long term (current) use of aspirin: Secondary | ICD-10-CM | POA: Diagnosis not present

## 2019-06-04 DIAGNOSIS — R197 Diarrhea, unspecified: Secondary | ICD-10-CM | POA: Diagnosis not present

## 2019-06-04 DIAGNOSIS — H5789 Other specified disorders of eye and adnexa: Secondary | ICD-10-CM | POA: Diagnosis present

## 2019-06-04 DIAGNOSIS — F17210 Nicotine dependence, cigarettes, uncomplicated: Secondary | ICD-10-CM | POA: Diagnosis not present

## 2019-06-04 LAB — COMPREHENSIVE METABOLIC PANEL
ALT: 26 U/L (ref 0–44)
AST: 27 U/L (ref 15–41)
Albumin: 4.2 g/dL (ref 3.5–5.0)
Alkaline Phosphatase: 78 U/L (ref 38–126)
Anion gap: 10 (ref 5–15)
BUN: 16 mg/dL (ref 6–20)
CO2: 26 mmol/L (ref 22–32)
Calcium: 9.7 mg/dL (ref 8.9–10.3)
Chloride: 108 mmol/L (ref 98–111)
Creatinine, Ser: 0.92 mg/dL (ref 0.44–1.00)
GFR calc Af Amer: >60 mL/min (ref >60)
GFR calc non Af Amer: >60 mL/min (ref >60)
Glucose, Bld: 97 mg/dL (ref 70–99)
Potassium: 4.2 mmol/L (ref 3.5–5.1)
Sodium: 144 mmol/L (ref 135–145)
Total Bilirubin: 0.6 mg/dL (ref 0.3–1.2)
Total Protein: 7.9 g/dL (ref 6.5–8.1)

## 2019-06-04 LAB — URINALYSIS, ROUTINE W REFLEX MICROSCOPIC
Bilirubin Urine: NEGATIVE
Glucose, UA: NEGATIVE mg/dL
Hgb urine dipstick: NEGATIVE
Ketones, ur: NEGATIVE mg/dL
Leukocytes,Ua: NEGATIVE
Nitrite: NEGATIVE
Protein, ur: NEGATIVE mg/dL
Specific Gravity, Urine: 1.021 (ref 1.005–1.030)
pH: 6 (ref 5.0–8.0)

## 2019-06-04 LAB — CBC
HCT: 51.1 % — ABNORMAL HIGH (ref 36.0–46.0)
Hemoglobin: 16.6 g/dL — ABNORMAL HIGH (ref 12.0–15.0)
MCH: 32.6 pg (ref 26.0–34.0)
MCHC: 32.5 g/dL (ref 30.0–36.0)
MCV: 100.4 fL — ABNORMAL HIGH (ref 80.0–100.0)
Platelets: 316 10*3/uL (ref 150–400)
RBC: 5.09 MIL/uL (ref 3.87–5.11)
RDW: 13.2 % (ref 11.5–15.5)
WBC: 15.7 10*3/uL — ABNORMAL HIGH (ref 4.0–10.5)
nRBC: 0 % (ref 0.0–0.2)

## 2019-06-04 LAB — LIPASE, BLOOD: Lipase: 51 U/L (ref 11–51)

## 2019-06-04 MED ORDER — TOBRAMYCIN 0.3 % OP SOLN: 2.0000 [drp] | OPHTHALMIC | 0 refills | Status: DC

## 2019-06-04 NOTE — Discharge Instructions (Addendum)
Your glucose was 97 which is excellent.  Rinse your eyes with tap water, then apply antibiotic drops.  Prescription given.  Recommend gastroenterologist for persistent diarrhea.  Phone number given.

## 2019-06-04 NOTE — ED Notes (Signed)
Called no answer

## 2019-06-04 NOTE — ED Triage Notes (Signed)
RT eye swelling yesterday, has gone down some today.  Also reports diarrhea, headache and nausea x 6 weeks.

## 2019-06-06 ENCOUNTER — Other Ambulatory Visit: Payer: Self-pay | Admitting: Family Medicine

## 2019-06-06 ENCOUNTER — Encounter: Payer: Self-pay | Admitting: Family Medicine

## 2019-06-06 NOTE — ED Provider Notes (Signed)
[facility] EMERGENCY DEPARTMENT Provider Note   CSN: [id number] Arrival date & time: 06/04/19  1702     History   Chief Complaint Chief Complaint  Patient presents with  . Diarrhea    HPI Annette [name] is a 58 y.o. female.     Chief complaint swollen right eye with discharge.  Additionally, patient complains of diarrhea, nausea, headache for several weeks.  No fever, chills, dysuria, chest pain, dyspnea, weight loss.  Diarrhea is watery.  No blood or mucus.  She is seeing a local gastroenterologist.  Severity of symptoms is mild to moderate.  She has tried nothing at home.     Past Medical History:  Diagnosis Date  . Breast cancer ([place]) 2007   History of  XRT, onTamoxifen  . Bruises easily   . Depression   . Diabetes mellitus    prediabetic  . GERD (gastroesophageal reflux disease)   . Hypertriglyceridemia   . Hypothyroidism    following chemo amnd radiaition for breast cancer, needed replacement short term  . Hypothyroidism (acquired)    replaced x 1 year  . IBS (irritable bowel syndrome)   . Menieres disease    Controlled with triamterene   . Migraines   . Obesity   . Palpitations   . 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 Sleep apnea 2010   Problems with CPAP    Patient Active Problem List   Diagnosis Date Noted  . Morbid obesity ([place]) 03/25/2019  . Back pain 03/25/2019  . Uncontrolled type 2 diabetes mellitus with hyperglycemia ([place]) 01/15/2019  . Light headedness 01/08/2019  . At risk for cardiovascular event 01/30/2018  . Palpitations 03/27/2015  . Hyperlipidemia LDL goal <100 07/15/2014  . GERD (gastroesophageal reflux disease) 10/09/2013  . IBS (irritable bowel syndrome) 10/09/2013  . GAD (generalized anxiety disorder) 05/14/2013  . Metabolic syndrome X [id number]  . HTN, goal below 130/80 01/09/2013  . Nicotine dependence 01/09/2013  . Insomnia 08/14/2012  . Type 2 diabetes mellitus with other specified complication ([place]) [id number]  .  Adrenal adenoma 08/06/2011  . Meniere's disease 04/22/2011  . Sleep apnea 04/22/2011  . ABNORMAL THYROID FUNCTION TESTS 08/17/2010  . Vitamin D deficiency 01/15/2010  . IBS 01/01/2010  . BREAST CANCER, HX OF 01/01/2010    Past Surgical History:  Procedure Laterality Date  . ABDOMINAL HYSTERECTOMY  1998   Benign, fibroids  . 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 APPENDECTOMY  06/12/08  . BREAST RECONSTRUCTION     Left reconstructive  . BREAST SURGERY  2007   Left lumpectomy  . BREAST SURGERY     Mammosite - right side  . CHOLECYSTECTOMY  1985  . ESOPHAGEAL DILATION  12/19/2015   Procedure: ESOPHAGEAL DILATION;  Surgeon: Rogene Houston, MD;  Location: [facility];  Service: Endoscopy;;  . ESOPHAGOGASTRODUODENOSCOPY N/A 12/19/2015   Procedure: ESOPHAGOGASTRODUODENOSCOPY (EGD);  Surgeon: Rogene Houston, MD;  Location: [facility];  Service: Endoscopy;  Laterality: N/A;  11:40  . 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 THYROIDECTOMY, PARTIAL  11/05/2010   Benign disease     OB History   No obstetric history on file.      Home Medications    Prior to Admission medications   Medication Sig Start Date End Date Taking? Authorizing Provider  aspirin EC 81 MG tablet Take 81 mg by mouth daily.   Yes [provider]  Carboxymethylcellulose  Sodium (EYE DROPS OP) Apply 1 drop to eye 2 (two) times daily as needed (FOR DRY EYE).   Yes [provider]  clotrimazole-betamethasone (LOTRISONE) cream APPLY TOPICALLY AS NEEDED Patient taking differently: Apply 1 application topically daily as needed (for irritation).  03/26/19  Yes Lockamy, Randi L, NP-C  esomeprazole (NEXIUM) 40 MG capsule TAKE 1 CAPSULE BY MOUTH EVERY DAY Patient taking differently: Take 40 mg by mouth every morning.  01/08/19  Yes Fayrene Helper, MD  glipiZIDE (GLUCOTROL XL) 10 MG 24 hr tablet Take 1 tablet (10  mg total) by mouth daily with breakfast. 02/16/19  Yes Fayrene Helper, MD  Insulin Glargine, 1 Unit Dial, 300 UNIT/ML SOPN Inject 30 Units into the skin at bedtime. 04/18/19  Yes Nida, Marella Chimes, MD  LORazepam (ATIVAN) 1 MG tablet TAKE ONE TABLET BY MOUTH EVERY 8 HOURS AS NEEDED FOR ANXIETY AND/OR NAUSEA Patient taking differently: Take 1 mg by mouth every 8 (eight) hours as needed for anxiety (AND/OR NAUSEA).  03/22/19  Yes Fayrene Helper, MD  Metoprolol Tartrate 37.5 MG TABS Take 1 tablet by mouth 2 (two) times daily. 01/08/19  Yes Fayrene Helper, MD  naproxen (NAPROSYN) 500 MG tablet Take 1 tablet (500 mg total) by mouth 2 (two) times daily with a meal. Patient taking differently: Take 500 mg by mouth 2 (two) times daily as needed for headache.  07/25/18  Yes Fayrene Helper, MD  rosuvastatin (CRESTOR) 20 MG tablet Take 1 tablet (20 mg total) by mouth daily. 01/08/19  Yes Fayrene Helper, MD  temazepam (RESTORIL) 15 MG capsule TAKE ONE CAPSULE BY MOUTH EVERY NIGHT AT BEDTIME AS NEEDED Patient taking differently: Take 15 mg by mouth at bedtime.  03/22/19  Yes Fayrene Helper, MD  traZODone (DESYREL) 100 MG tablet Take 1 tablet (100 mg total) by mouth at bedtime. 01/08/19  Yes Fayrene Helper, MD  triamterene-hydrochlorothiazide (MAXZIDE-25) 37.5-25 MG tablet Take one half tablet by mouth once daily Patient taking differently: Take 1 tablet by mouth daily.  01/08/19  Yes Fayrene Helper, MD  venlafaxine XR (EFFEXOR-XR) 75 MG 24 hr capsule Take 1 capsule (75 mg total) by mouth daily. 01/08/19  Yes Fayrene Helper, MD  Vitamin D, Ergocalciferol, (DRISDOL) 1.25 MG (50000 UT) CAPS capsule Take 1 capsule (50,000 Units total) by mouth every 7 (seven) days. 1 capsule by mouth weekly Patient taking differently: Take 50,000 Units by mouth every Tuesday. 1 capsule by mouth weekly 03/22/19  Yes Fayrene Helper, MD  tobramycin (TOBREX) 0.3 % ophthalmic solution Place 2 drops  into the right eye every 4 (four) hours. 06/04/19   Nat Christen, MD    Family History Family History  Problem Relation Age of Onset  . Diabetes Mother   . Heart disease Father   . Cancer Paternal Grandmother        Breast    Social History Social History   Tobacco Use  . Smoking status: Current Every Day Smoker    Packs/day: 0.30    Years: 0.50    Pack years: 0.15    Types: Cigarettes  . Smokeless tobacco: Never Used  . Tobacco comment: 1/2 pack a day  Substance Use Topics  . Alcohol use: No    Alcohol/week: 0.0 standard drinks    Comment: Encouraged to quit smoking. She has tried the nicotone gum and patches but did not help  . Drug use: No     Allergies   Penicillins, Metformin  and related, Statins, Tape, and Zetia [ezetimibe]   Review of Systems Review of Systems  All other systems reviewed and are negative.    Physical Exam Updated Vital Signs BP (!) 102/51   Pulse (!) 58   Temp 98.4 F (36.9 C) (Oral)   Resp 18   Ht 5\' 6"  (1.676 m)   Wt 98.4 kg   SpO2 96%   BMI 35.02 kg/m   Physical Exam Vitals signs and nursing note reviewed.  Constitutional:      Appearance: She is well-developed.  HENT:     Head: Normocephalic and atraumatic.  Eyes:     Conjunctiva/sclera: Conjunctivae normal.     Comments: Left eye normal.  Right eye: Area of erythema on the bilateral lids with a crusted yellowish discharge.  Neck:     Musculoskeletal: Neck supple.  Cardiovascular:     Rate and Rhythm: Normal rate and regular rhythm.  Pulmonary:     Effort: Pulmonary effort is normal.     Breath sounds: Normal breath sounds.  Abdominal:     General: Bowel sounds are normal.     Palpations: Abdomen is soft.  Musculoskeletal: Normal range of motion.  Skin:    General: Skin is warm and dry.  Neurological:     Mental Status: She is alert and oriented to person, place, and time.  Psychiatric:        Behavior: Behavior normal.      ED Treatments / Results   Labs (all labs ordered are listed, but only abnormal results are displayed) Labs Reviewed  CBC - Abnormal; Notable for the following components:      Result Value   WBC 15.7 (*)    Hemoglobin 16.6 (*)    HCT 51.1 (*)    MCV 100.4 (*)    All other components within normal limits  URINALYSIS, ROUTINE W REFLEX MICROSCOPIC - Abnormal; Notable for the following components:   APPearance HAZY (*)    All other components within normal limits  LIPASE, BLOOD  COMPREHENSIVE METABOLIC PANEL    EKG None  Radiology No results found.  Procedures Procedures (including critical care time)  Medications Ordered in ED Medications - No data to display   Initial Impression / Assessment and Plan / ED Course  I have reviewed the triage vital signs and the nursing notes.  Pertinent labs & imaging results that were available during my care of the patient were reviewed by me and considered in my medical decision making (see chart for details).        No acute abdomen in regards to her complaints of diarrhea.  She has gastroenterology follow-up.  Will Rx Tobrex ophthalmic solution for conjunctivitis.  Final Clinical Impressions(s) / ED Diagnoses   Final diagnoses:  Conjunctivitis of right eye, unspecified conjunctivitis type  Diarrhea, unspecified type    ED Discharge Orders         Ordered    tobramycin (TOBREX) 0.3 % ophthalmic solution  Every 4 hours     06/04/19 2315           Nat Christen, MD 06/06/19 1416

## 2019-06-07 ENCOUNTER — Telehealth: Payer: Self-pay | Admitting: *Deleted

## 2019-06-07 ENCOUNTER — Telehealth: Payer: Self-pay

## 2019-06-07 ENCOUNTER — Other Ambulatory Visit: Payer: Self-pay | Admitting: Family Medicine

## 2019-06-07 ENCOUNTER — Encounter: Payer: Self-pay | Admitting: Family Medicine

## 2019-06-07 MED ORDER — INSULIN GLARGINE 100 UNIT/ML SOLOSTAR PEN: 30.0000 [IU] | PEN_INJECTOR | Freq: Every day | SUBCUTANEOUS | 2 refills | Status: DC

## 2019-06-07 NOTE — Telephone Encounter (Signed)
Pt called requesting a refill on her tamazepam. She is going out of town and needs this medication today.

## 2019-06-07 NOTE — Telephone Encounter (Signed)
Could you refill this for her?

## 2019-06-07 NOTE — Telephone Encounter (Signed)
lantus sent

## 2019-06-08 ENCOUNTER — Encounter: Payer: Self-pay | Admitting: Cardiovascular Disease

## 2019-06-08 ENCOUNTER — Telehealth: Payer: Self-pay | Admitting: Radiology

## 2019-06-08 ENCOUNTER — Other Ambulatory Visit: Payer: Self-pay

## 2019-06-08 ENCOUNTER — Ambulatory Visit (INDEPENDENT_AMBULATORY_CARE_PROVIDER_SITE_OTHER): Payer: BC Managed Care – PPO | Admitting: Cardiovascular Disease

## 2019-06-08 VITALS — BP 110/68 | HR 66 | Ht 66.0 in | Wt 225.6 lb

## 2019-06-08 DIAGNOSIS — R079 Chest pain, unspecified: Secondary | ICD-10-CM

## 2019-06-08 DIAGNOSIS — R001 Bradycardia, unspecified: Secondary | ICD-10-CM

## 2019-06-08 DIAGNOSIS — Z01812 Encounter for preprocedural laboratory examination: Secondary | ICD-10-CM

## 2019-06-08 DIAGNOSIS — R0602 Shortness of breath: Secondary | ICD-10-CM

## 2019-06-08 DIAGNOSIS — R42 Dizziness and giddiness: Secondary | ICD-10-CM

## 2019-06-08 NOTE — Telephone Encounter (Signed)
Per the Doctor patient to wear 7 days instead of 48hrs. Patient was called and left a voicemail

## 2019-06-08 NOTE — Telephone Encounter (Signed)
Enrolled patient for a 3 day Zio monitor to be mailed. Brief instructions were gone over with patient and she knows to expect the monitor to arrive in 3-4 days. 48hr was changed to a 3 day Zio for mailing purposes during covid

## 2019-06-08 NOTE — Addendum Note (Signed)
Addended by: Kathyrn Lass on: 06/08/2019 04:45 PM   Modules accepted: Orders

## 2019-06-08 NOTE — Patient Instructions (Signed)
Medication Instructions:  Continue same medications   Lab work: None ordered  Testing/Procedures: Schedule Echo  Schedule 48 hour Holter Monitor  Schedule Treadmill  ( POET )  Need to have Covid Test 2 days before Treadmill at [facility] Thru Pre Procedure Line No appointment needed will need to quarantine until after test  Follow-Up: At [facility], you and your health needs are our priority.  As part of our continuing mission to provide you with exceptional heart care, we have created designated Provider Care Teams.  These Care Teams include your primary Cardiologist (physician) and Advanced Practice Providers (APPs -  Physician Assistants and Nurse Practitioners) who all work together to provide you with the care you need, when you need it. . Schedule follow up appointment in 4 months

## 2019-06-08 NOTE — Progress Notes (Signed)
Cardiology Office Note:   Date:  06/08/2019  NAME:  Annette [name]    MRN: [id number] DOB:  1961-08-26   PCP:  Fayrene Helper, MD  Cardiologist:  No primary care provider on file.  Electrophysiologist:  None   Referring MD: Fayrene Helper, MD   Chief Complaint  Patient presents with  . Bradycardia   History of Present Illness:   Annette [name] is a 58 y.o. female with a hx of diabetes, obesity who is being seen today for the evaluation of bradycardia at the request of Fayrene Helper, MD.  She presents for evaluation of bradycardia.  She reports since getting an apple watch around 3 weeks ago, she notices that her heart rate gets into the 40s.  She reports she is able to get up and walk around and increase it to the 70-80 range.  She does report occasional dizziness with this.  She also reports occasional chest tightness.  She is concerned that her heart rate is too low.  She also reports occasional decreased energy that has been going on for nearly 1 year, associated with shortness of breath.  She gets the dizziness and low heart rates nearly every day.  This is bothersome to her given her family history of heart disease.  Her EKG in office today demonstrates a heart rate of 66 which is very normal.  Her most recent lipid profile was normal with a LDL of 59.  She had a exercise stress echo in 2013 that was normal.  Her resting heart rate was noted to be sinus bradycardia, but it increased to prior with exercise.  Her cardiovascular disease risk factors include diabetes, recently started on insulin 2 months ago.  She is a 40-pack-year smoker and continues to do so.  She is trying to quit.  She has had a father who had a first myocardial infarction at 60 years of age.  Past Medical History: Past Medical History:  Diagnosis Date  . Breast cancer ([place]) 2007   History of  XRT, onTamoxifen  . Bruises easily   . Depression   . Diabetes mellitus    prediabetic  . GERD  (gastroesophageal reflux disease)   . Hypertriglyceridemia   . Hypothyroidism    following chemo amnd radiaition for breast cancer, needed replacement short term  . Hypothyroidism (acquired)    replaced x 1 year  . IBS (irritable bowel syndrome)   . Menieres disease    Controlled with triamterene   . Migraines   . Obesity   . Palpitations   . 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[place])   . Sleep apnea 2010   Problems with CPAP    Past Surgical History: Past Surgical History:  Procedure Laterality Date  . ABDOMINAL HYSTERECTOMY  1998   Benign, fibroids  . 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 APPENDECTOMY  06/12/08  . BREAST RECONSTRUCTION     Left reconstructive  . BREAST SURGERY  2007   Left lumpectomy  . BREAST SURGERY     Mammosite - right side  . CHOLECYSTECTOMY  1985  . ESOPHAGEAL DILATION  12/19/2015   Procedure: ESOPHAGEAL DILATION;  Surgeon: Rogene Houston, MD;  [facility];  Service: Endoscopy;;  . ESOPHAGOGASTRODUODENOSCOPY N/A 12/19/2015   Procedure: ESOPHAGOGASTRODUODENOSCOPY (EGD);  Surgeon: Rogene Houston, MD;  [facility];  Service: Endoscopy;  Laterality: N/A;  11:40  . 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 THYROIDECTOMY, PARTIAL  11/05/2010   Benign disease    Current Medications: Current Meds  Medication Sig  . aspirin EC 81 MG tablet Take 81 mg by mouth daily.  . Carboxymethylcellulose Sodium (EYE DROPS OP) Apply 1 drop to eye 2 (two) times daily as needed (FOR DRY EYE).  . clotrimazole-betamethasone (LOTRISONE) cream APPLY TOPICALLY AS NEEDED (Patient taking differently: Apply 1 application topically daily as needed (for irritation). )  . esomeprazole (NEXIUM) 40 MG capsule TAKE 1 CAPSULE BY MOUTH EVERY DAY (Patient taking differently: Take 40 mg by mouth every morning. )  . glipiZIDE (GLUCOTROL XL) 10 MG 24 hr tablet Take 1 tablet (10 mg total) by  mouth daily with breakfast.  . Insulin Glargine (LANTUS) 100 UNIT/ML Solostar Pen Inject 30 Units into the skin at bedtime.  Marland Kitchen LORazepam (ATIVAN) 1 MG tablet TAKE ONE TABLET BY MOUTH EVERY 8 HOURS AS NEEDED FOR ANXIETY AND/OR NAUSEA (Patient taking differently: Take 1 mg by mouth every 8 (eight) hours as needed for anxiety (AND/OR NAUSEA). )  . Metoprolol Tartrate 37.5 MG TABS Take 1 tablet by mouth 2 (two) times daily.  . naproxen (NAPROSYN) 500 MG tablet Take 1 tablet (500 mg total) by mouth 2 (two) times daily with a meal. (Patient taking differently: Take 500 mg by mouth 2 (two) times daily as needed for headache. )  . rosuvastatin (CRESTOR) 20 MG tablet Take 1 tablet (20 mg total) by mouth daily.  . temazepam (RESTORIL) 15 MG capsule TAKE ONE CAPSULE BY MOUTH EVERY NIGHT AT BEDTIME AS NEEDED (Patient taking differently: Take 15 mg by mouth at bedtime. )  . tobramycin (TOBREX) 0.3 % ophthalmic solution Place 2 drops into the right eye every 4 (four) hours.  . traZODone (DESYREL) 100 MG tablet Take 1 tablet (100 mg total) by mouth at bedtime.  . triamterene-hydrochlorothiazide (MAXZIDE-25) 37.5-25 MG tablet Take one half tablet by mouth once daily (Patient taking differently: Take 1 tablet by mouth daily. )  . venlafaxine XR (EFFEXOR-XR) 75 MG 24 hr capsule Take 1 capsule (75 mg total) by mouth daily.  . Vitamin D, Ergocalciferol, (DRISDOL) 1.25 MG (50000 UT) CAPS capsule Take 1 capsule (50,000 Units total) by mouth every 7 (seven) days. 1 capsule by mouth weekly (Patient taking differently: Take 50,000 Units by mouth every Tuesday. 1 capsule by mouth weekly)     Allergies:    Penicillins, Metformin and related, Statins, Tape, and Zetia [ezetimibe]   Social History: Social History   Socioeconomic History  . Marital status: Married    Spouse name: Not on file  . Number of children: Not on file  . Years of education: Not on file  . Highest education level: Not on file  Occupational History   . Not on file  Social Needs  . Financial resource strain: Not on file  . Food insecurity    Worry: Not on file    Inability: Not on file  . Transportation needs    Medical: Not on file    Non-medical: Not on file  Tobacco Use  . Smoking status: Current Every Day Smoker    Packs/day: 0.50    Years: 40.00    Pack years: 20.00    Types: Cigarettes  . Smokeless tobacco: Never Used  . Tobacco comment: 1/2 pack a day  Substance and Sexual Activity  . Alcohol use: No    Alcohol/week: 0.0 standard drinks    Comment: Encouraged to quit smoking. She has  tried the nicotone gum and patches but did not help  . Drug use: No  . Sexual activity: Yes    Birth control/protection: Surgical  Lifestyle  . Physical activity    Days per week: Not on file    Minutes per session: Not on file  . Stress: Not on file  Relationships  . Social Herbalist on phone: Not on file    Gets together: Not on file    Attends religious service: Not on file    Active member of club or organization: Not on file    Attends meetings of clubs or organizations: Not on file    Relationship status: Not on file  Other Topics Concern  . Not on file  Social History Narrative  . Not on file     Family History: The patient's family history includes Arrhythmia in her mother; Cancer in her paternal grandmother; Diabetes in her mother; Heart disease in her father.  ROS:   All other ROS reviewed and negative. Pertinent positives noted in the HPI.     EKGs/Labs/Other Studies Reviewed:   The following studies were personally reviewed by me today:  EKG:  EKG is ordered today.  The ekg ordered today demonstrates normal sinus rhythm, heart rate 66, normal intervals, no acute ischemic changes, no prior infarction, and was personally reviewed by me.   Recent Labs: 11/21/2018: TSH 1.680 06/04/2019: ALT 26; BUN 16; Creatinine, Ser 0.92; Hemoglobin 16.6; Platelets 316; Potassium 4.2; Sodium 144   Recent Lipid Panel     Component Value Date/Time   CHOL 115 11/21/2018 0950   TRIG 93 11/21/2018 0950   HDL 37 (L) 11/21/2018 0950   CHOLHDL 3.1 11/21/2018 0950   CHOLHDL 5.0 11/15/2014 1119   VLDL 25 11/15/2014 1119   LDLCALC 59 11/21/2018 0950    Physical Exam:   VS:  BP 110/68   Pulse 66   Ht 5\' 6"  (1.676 m)   Wt 225 lb 9.6 oz (102.3 kg)   SpO2 93%   BMI 36.41 kg/m    Wt Readings from Last 3 Encounters:  06/08/19 225 lb 9.6 oz (102.3 kg)  06/04/19 217 lb (98.4 kg)  03/22/19 217 lb (98.4 kg)    General: Well nourished, well developed, in no acute distress Heart: Atraumatic, normal size  Eyes: PEERLA, EOMI  Neck: Supple, no JVD Endocrine: No thryomegaly Cardiac: Normal S1, S2; RRR; no murmurs, rubs, or gallops Lungs: Clear to auscultation bilaterally, no wheezing, rhonchi or rales  Abd: Soft, nontender, no hepatomegaly  Ext: No edema, pulses 2+ Musculoskeletal: No deformities, BUE and BLE strength normal and equal Skin: Warm and dry, no rashes   Neuro: Alert and oriented to person, place, time, and situation, CNII-XII grossly intact, no focal deficits  Psych: Normal mood and affect   ASSESSMENT:   NAME@ is a 58 y.o. female who presents for the following: 1. Sinus bradycardia   2. Pre-procedure lab exam   3. SOB (shortness of breath) on exertion   4. Chest pain, unspecified type   5. Dizziness     PLAN:   1. Sinus bradycardia 2. Pre-procedure lab exam 3. SOB (shortness of breath) on exertion 4. Chest pain, unspecified type 5. Dizziness -She presents with a constellation of symptoms, and reported low heart rates on her apple watch.  Her EKG today is very normal without any acute changes, with heart rate 66 and normal sinus rhythm.  I have reviewed her medications and there are no  really apparent medications that will slow her heart rate.  Her thyroid is normal.  She is on minimal blood pressure medications.  She is on trazodone and Effexor, and these can affect heart rate but they  not appear to be doing so at the moment I will continue this for now.  I think it is reasonable for her to wear a 48-hour Holter monitor to determine what exactly is going on with her heart rate when it is low.  She also has atypical symptoms of chest pain reported intermittently.  I think we can also set her up for an exercise treadmill stress test given her normal EKG, at that time we can also assess her heart rate response to exercise.  I think a repeat echocardiogram is also reasonable to ensure heart structurally normal.  There are no murmurs on examination and she is clinically without any evidence of heart dysfunction. -We will see her back in around 4 months after testing.   Disposition: Return in about 4 months (around 10/08/2019).  Medication Adjustments/Labs and Tests Ordered: Current medicines are reviewed at length with the patient today.  Concerns regarding medicines are outlined above.  Orders Placed This Encounter  Procedures  . Novel Coronavirus, NAA ([facility])  . Holter monitor - 48 hour  . Exercise Tolerance Test  . EKG 12-Lead  . ECHOCARDIOGRAM COMPLETE   No orders of the defined types were placed in this encounter.   Patient Instructions  Medication Instructions:  Continue same medications   Lab work: None ordered  Testing/Procedures: Schedule Echo  Schedule 48 hour Holter Monitor  Schedule Treadmill  ( POET )  Need to have Covid Test 2 days before Treadmill at [facility] Thru Pre Procedure Line No appointment needed will need to quarantine until after test  Follow-Up: At [facility], you and your health needs are our priority.  As part of our continuing mission to provide you with exceptional heart care, we have created designated Provider Care Teams.  These Care Teams include your primary Cardiologist (physician) and Advanced Practice Providers (APPs -  Physician Assistants and Nurse Practitioners) who all work together to provide you with the care  you need, when you need it. . Schedule follow up appointment in 4 months      Signed, Lake Bells T. Audie Box, [place]  [address], [place], [place] [address] [phone]  06/08/2019 10:31 AM

## 2019-06-12 ENCOUNTER — Ambulatory Visit (INDEPENDENT_AMBULATORY_CARE_PROVIDER_SITE_OTHER): Payer: BC Managed Care – PPO

## 2019-06-12 DIAGNOSIS — R0602 Shortness of breath: Secondary | ICD-10-CM | POA: Diagnosis not present

## 2019-06-12 DIAGNOSIS — R42 Dizziness and giddiness: Secondary | ICD-10-CM

## 2019-06-12 DIAGNOSIS — Z01812 Encounter for preprocedural laboratory examination: Secondary | ICD-10-CM

## 2019-06-12 DIAGNOSIS — R079 Chest pain, unspecified: Secondary | ICD-10-CM

## 2019-06-12 DIAGNOSIS — R001 Bradycardia, unspecified: Secondary | ICD-10-CM | POA: Diagnosis not present

## 2019-06-14 ENCOUNTER — Other Ambulatory Visit: Payer: Self-pay

## 2019-06-14 ENCOUNTER — Other Ambulatory Visit (HOSPITAL_COMMUNITY): Payer: BC Managed Care – PPO

## 2019-06-14 ENCOUNTER — Ambulatory Visit (INDEPENDENT_AMBULATORY_CARE_PROVIDER_SITE_OTHER): Payer: BC Managed Care – PPO | Admitting: Family Medicine

## 2019-06-14 ENCOUNTER — Encounter: Payer: Self-pay | Admitting: Family Medicine

## 2019-06-14 VITALS — BP 102/60 | HR 72 | Temp 98.6°F | Resp 12 | Ht 60.0 in | Wt 224.0 lb

## 2019-06-14 DIAGNOSIS — H109 Unspecified conjunctivitis: Secondary | ICD-10-CM

## 2019-06-14 DIAGNOSIS — I10 Essential (primary) hypertension: Secondary | ICD-10-CM

## 2019-06-14 NOTE — Patient Instructions (Signed)
Thank you for coming into the office today. I appreciate the opportunity to provide you with the care for your health and wellness. Today we discussed: Follow-up from the emergency room for conjunctivitis  Follow-up: 08/21/2019  No labs today  So glad that your eyes doing much better. Try not to rub your eye.  If it gets red again please call the office.  When she stopped feeling the pain and tenderness and the redness completely dissolved she can stop using the eyedrops if you have any left.  Please let us know if you continue having trouble picking up your prescription today.  Please continue to practice social distancing to keep you, your family, and our community safe.  If you must go out, please wear a Mask and practice good handwashing.  [place] YOUR HANDS WELL AND FREQUENTLY. AVOID TOUCHING YOUR FACE, UNLESS YOUR HANDS ARE FRESHLY WASHED.  GET FRESH AIR DAILY. STAY HYDRATED WITH WATER.   It was a pleasure to see you and I look forward to continuing to work together on your health and well-being. Please do not hesitate to call the office if you need care or have questions about your care.  Have a wonderful day and week. With Gratitude, Cherly Beach, DNP, AGNP-BC

## 2019-06-14 NOTE — Progress Notes (Signed)
Subjective:     Patient ID: Annette [name], female   DOB: January 05, 1961, 58 y.o.   MRN: [id number]  Annette [name] presents for follow up for ER (in ED for pink eye)  In today for follow-up after going to the emergency room back on August 31 secondary to having a swollen right eye.  She had discharge with this as well.  She had complained of diarrhea, nausea, headache for several weeks as well.  But she denied fevers, chills, dysuria, chest pain, dyspnea, weight loss.  Diarrhea was noted to be watery.  There is no blood or mucus.  She has been seeing a local gastroenterologist for this.  She had tried nothing at home to relieve the eye discomfort.  She was diagnosed with conjunctivitis of the right eye unspecified type.  And was provided with tobramycin eyedrops.  Her eye today is no longer injected and not swollen.  She reports that is feeling much better.  All of her ocular eye movements are intact.  Today patient denies signs and symptoms of COVID 19 infection including fever, chills, cough, shortness of breath, and headache.  Past Medical, Surgical, Social History, Allergies, and Medications have been Reviewed.  Past Medical History:  Diagnosis Date  . Breast cancer ([place]) 2007   History of  XRT, onTamoxifen  . Bruises easily   . Depression   . Diabetes mellitus    prediabetic  . GERD (gastroesophageal reflux disease)   . Hypertriglyceridemia   . Hypothyroidism    following chemo amnd radiaition for breast cancer, needed replacement short term  . Hypothyroidism (acquired)    replaced x 1 year  . IBS (irritable bowel syndrome)   . Menieres disease    Controlled with triamterene   . Migraines   . Obesity   . Palpitations   . 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[place])   . Sleep apnea 2010   Problems with CPAP   Past Surgical History:  Procedure Laterality Date  . ABDOMINAL HYSTERECTOMY  1998   Benign, fibroids  . 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 APPENDECTOMY  06/12/08  . BREAST RECONSTRUCTION     Left reconstructive  . BREAST SURGERY  2007   Left lumpectomy  . BREAST SURGERY     Mammosite - right side  . CHOLECYSTECTOMY  1985  . ESOPHAGEAL DILATION  12/19/2015   Procedure: ESOPHAGEAL DILATION;  Surgeon: Rogene Houston, MD;  [facility];  Service: Endoscopy;;  . ESOPHAGOGASTRODUODENOSCOPY N/A 12/19/2015   Procedure: ESOPHAGOGASTRODUODENOSCOPY (EGD);  Surgeon: Rogene Houston, MD;  [facility];  Service: Endoscopy;  Laterality: N/A;  11:40  . 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 THYROIDECTOMY, PARTIAL  11/05/2010   Benign disease   Social History   Socioeconomic History  . Marital status: Married    Spouse name: Not on file  . Number of children: Not on file  . Years of education: Not on file  . Highest education level: Not on file  Occupational History  . Not on file  Social Needs  . Financial resource strain: Not on file  . Food insecurity    Worry: Not on file    Inability: Not on file  . Transportation needs    Medical: Not on file    Non-medical: Not on file  Tobacco Use  . Smoking status: Current Every Day Smoker    Packs/day: 0.50  Years: 40.00    Pack years: 20.00    Types: Cigarettes  . Smokeless tobacco: Never Used  . Tobacco comment: 1/2 pack a day  Substance and Sexual Activity  . Alcohol use: No    Alcohol/week: 0.0 standard drinks    Comment: Encouraged to quit smoking. She has tried the nicotone gum and patches but did not help  . Drug use: No  . Sexual activity: Yes    Birth control/protection: Surgical  Lifestyle  . Physical activity    Days per week: Not on file    Minutes per session: Not on file  . Stress: Not on file  Relationships  . Social Herbalist on phone: Not on file    Gets together: Not on file    Attends religious service: Not on file    Active member of club or  organization: Not on file    Attends meetings of clubs or organizations: Not on file    Relationship status: Not on file  . Intimate partner violence    Fear of current or ex partner: Not on file    Emotionally abused: Not on file    Physically abused: Not on file    Forced sexual activity: Not on file  Other Topics Concern  . Not on file  Social History Narrative  . Not on file    Outpatient Encounter Medications as of 06/14/2019  Medication Sig  . aspirin EC 81 MG tablet Take 81 mg by mouth daily.  . Carboxymethylcellulose Sodium (EYE DROPS OP) Apply 1 drop to eye 2 (two) times daily as needed (FOR DRY EYE).  . clotrimazole-betamethasone (LOTRISONE) cream APPLY TOPICALLY AS NEEDED (Patient taking differently: Apply 1 application topically daily as needed (for irritation). )  . esomeprazole (NEXIUM) 40 MG capsule TAKE 1 CAPSULE BY MOUTH EVERY DAY (Patient taking differently: Take 40 mg by mouth every morning. )  . glipiZIDE (GLUCOTROL XL) 10 MG 24 hr tablet Take 1 tablet (10 mg total) by mouth daily with breakfast.  . Insulin Glargine (LANTUS) 100 UNIT/ML Solostar Pen Inject 30 Units into the skin at bedtime.  Marland Kitchen LORazepam (ATIVAN) 1 MG tablet TAKE ONE TABLET BY MOUTH EVERY 8 HOURS AS NEEDED FOR ANXIETY AND/OR NAUSEA (Patient taking differently: Take 1 mg by mouth every 8 (eight) hours as needed for anxiety (AND/OR NAUSEA). )  . Metoprolol Tartrate 37.5 MG TABS Take 1 tablet by mouth 2 (two) times daily.  . naproxen (NAPROSYN) 500 MG tablet Take 1 tablet (500 mg total) by mouth 2 (two) times daily with a meal. (Patient taking differently: Take 500 mg by mouth 2 (two) times daily as needed for headache. )  . rosuvastatin (CRESTOR) 20 MG tablet Take 1 tablet (20 mg total) by mouth daily.  . temazepam (RESTORIL) 15 MG capsule Take 1 capsule (15 mg total) by mouth at bedtime.  Marland Kitchen tobramycin (TOBREX) 0.3 % ophthalmic solution Place 2 drops into the right eye every 4 (four) hours.  . traZODone  (DESYREL) 100 MG tablet Take 1 tablet (100 mg total) by mouth at bedtime.  . triamterene-hydrochlorothiazide (MAXZIDE-25) 37.5-25 MG tablet Take one half tablet by mouth once daily (Patient taking differently: Take 1 tablet by mouth daily. )  . venlafaxine XR (EFFEXOR-XR) 75 MG 24 hr capsule Take 1 capsule (75 mg total) by mouth daily.  . Vitamin D, Ergocalciferol, (DRISDOL) 1.25 MG (50000 UT) CAPS capsule Take 1 capsule (50,000 Units total) by mouth every 7 (seven) days. 1  capsule by mouth weekly (Patient taking differently: Take 50,000 Units by mouth every Tuesday. 1 capsule by mouth weekly)   No facility-administered encounter medications on file as of 06/14/2019.    Allergies  Allergen Reactions  . Penicillins Swelling    Has patient had a PCN reaction causing immediate rash, facial/tongue/throat swelling, SOB or lightheadedness with hypotension: Yes Has patient had a PCN reaction causing severe rash involving mucus membranes or skin necrosis: No Has patient had a PCN reaction that required hospitalization: No  Has patient had a PCN reaction occurring within the last 10 years: No If all of the above answers are "NO", then may proceed with Cephalosporin use.   Of face and tongue.  . Metformin And Related Nausea Only    Pt to discontinue med due to intolerance, cramps and diarrhea  . Statins Other (See Comments)    Major concerns re safety  . Tape Itching    Surgical adhesive  . Zetia [Ezetimibe] Other (See Comments)    Feels funny    Review of Systems  Constitutional: Negative for chills and fever.  HENT: Negative.   Eyes: Negative.   Respiratory: Negative.  Negative for cough and shortness of breath.   Cardiovascular: Negative.   Gastrointestinal: Negative.   Endocrine: Negative.   Genitourinary: Negative.   Musculoskeletal: Negative.   Skin: Negative.   Allergic/Immunologic: Negative.   Neurological: Negative.   Hematological: Negative.   Psychiatric/Behavioral:  Negative.   All other systems reviewed and are negative.      Objective:     BP 102/60   Pulse 72   Temp 98.6 F (37 C) (Oral)   Resp 12   Ht 5' (1.524 m)   Wt 224 lb (101.6 kg)   SpO2 95%   BMI 43.75 kg/m   Physical Exam Vitals signs and nursing note reviewed.  Constitutional:      Appearance: Normal appearance. She is well-developed and well-groomed. She is obese.  HENT:     Head: Normocephalic and atraumatic.     Right Ear: External ear normal.     Left Ear: External ear normal.     Nose: Nose normal.     Mouth/Throat:     Mouth: Mucous membranes are moist.     Pharynx: Oropharynx is clear.  Eyes:     General: Lids are normal.        Right eye: No discharge.        Left eye: No discharge.     Extraocular Movements: Extraocular movements intact.     Conjunctiva/sclera: Conjunctivae normal.     Right eye: Right conjunctiva is not injected.     Left eye: Left conjunctiva is not injected.  Neck:     Musculoskeletal: Normal range of motion and neck supple.  Cardiovascular:     Rate and Rhythm: Normal rate and regular rhythm.     Pulses: Normal pulses.     Heart sounds: Normal heart sounds.  Pulmonary:     Effort: Pulmonary effort is normal.     Breath sounds: Normal breath sounds.  Musculoskeletal: Normal range of motion.  Skin:    General: Skin is warm.  Neurological:     General: No focal deficit present.     Mental Status: She is alert and oriented to person, place, and time.  Psychiatric:        Attention and Perception: Attention normal.        Mood and Affect: Mood normal.  Speech: Speech normal.        Behavior: Behavior normal. Behavior is cooperative.        Thought Content: Thought content normal.        Cognition and Memory: Cognition normal.        Judgment: Judgment normal.        Assessment and Plan       1. Conjunctivitis of right eye, unspecified conjunctivitis type Resolved,  Provided education on how to avoid future pink eye  discussed  2. HTN, goal below 130/80 Annette [name] is encouraged to maintain a well balanced diet that is low in salt. Controlled, continue current medication regimen.  She is also reminded that exercise is beneficial for heart health and control of  Blood pressure. 30-60 minutes daily is recommended-walking was suggested.   Follow Up: 08/21/2019  Perlie Mayo, DNP, AGNP-BC [place], [place] [place], [place] [address] Office Hours: Mon-Thurs 8 am-5 pm; Fri 8 am-12 pm Office Phone:  [phone]  Office Fax: [phone]

## 2019-06-17 ENCOUNTER — Other Ambulatory Visit: Payer: Self-pay | Admitting: Family Medicine

## 2019-06-18 ENCOUNTER — Other Ambulatory Visit: Payer: Self-pay

## 2019-06-18 ENCOUNTER — Ambulatory Visit (HOSPITAL_COMMUNITY): Payer: BC Managed Care – PPO | Attending: Cardiology

## 2019-06-18 DIAGNOSIS — Z01812 Encounter for preprocedural laboratory examination: Secondary | ICD-10-CM | POA: Diagnosis not present

## 2019-06-18 DIAGNOSIS — R001 Bradycardia, unspecified: Secondary | ICD-10-CM | POA: Insufficient documentation

## 2019-06-18 MED ORDER — PERFLUTREN LIPID MICROSPHERE
1.0000 mL | INTRAVENOUS | Status: AC | PRN
  Administered 2019-06-18: 2 mL via INTRAVENOUS

## 2019-06-19 ENCOUNTER — Telehealth: Payer: Self-pay | Admitting: Cardiovascular Disease

## 2019-06-19 DIAGNOSIS — R072 Precordial pain: Secondary | ICD-10-CM

## 2019-06-19 DIAGNOSIS — R0602 Shortness of breath: Secondary | ICD-10-CM

## 2019-06-19 DIAGNOSIS — Z01818 Encounter for other preprocedural examination: Secondary | ICD-10-CM

## 2019-06-19 NOTE — Telephone Encounter (Signed)
FYI for you, Thanks!

## 2019-06-19 NOTE — Telephone Encounter (Signed)
Patient cancelled her upcoming Treadmill test. She did not want to take a COVID test, and even if she did want to take a test, she could not afford to miss two or three days of work to quarantine before the test.  If there is another test that would not require her to take a COVID test, please let her know

## 2019-06-19 NOTE — Addendum Note (Signed)
Addended by: Raiford Simmonds on: 06/19/2019 05:59 PM   Modules accepted: Orders

## 2019-06-19 NOTE — Telephone Encounter (Addendum)
Spoke to patient  -  Discuss with patient  another option - of getting results  Called cardiac CTA,-   per DR O'NEAL.    Patient  is  Aware- description of test given   - order is placed -  Aware  Test will need prior authorization  And  Instruction will be given once ccta is schedule Patient verbalized understanding    ( Lets get her set up for a cardiac CTA. We can give 50 mg metoprolol tartrate 1 hour before her scan. -W )    possible instructions -- Patient is already taking metoprolol 37.5 mg twice a day - will have her to take  The above the day of the test  Bmp done @ 2 weeks ago depending on when test is schedule may not need another BMP .--

## 2019-06-27 ENCOUNTER — Encounter (HOSPITAL_COMMUNITY): Payer: BC Managed Care – PPO

## 2019-07-02 ENCOUNTER — Other Ambulatory Visit: Payer: Self-pay | Admitting: Family Medicine

## 2019-07-03 ENCOUNTER — Other Ambulatory Visit: Payer: Self-pay

## 2019-07-23 ENCOUNTER — Ambulatory Visit: Payer: BC Managed Care – PPO | Admitting: "Endocrinology

## 2019-07-25 ENCOUNTER — Ambulatory Visit: Payer: BC Managed Care – PPO | Admitting: "Endocrinology

## 2019-07-25 ENCOUNTER — Other Ambulatory Visit: Payer: Self-pay | Admitting: "Endocrinology

## 2019-07-26 LAB — COMPREHENSIVE METABOLIC PANEL
ALT: 17 IU/L (ref 0–32)
AST: 18 IU/L (ref 0–40)
Albumin/Globulin Ratio: 1.3 (ref 1.2–2.2)
Albumin: 3.9 g/dL (ref 3.8–4.9)
Alkaline Phosphatase: 95 IU/L (ref 39–117)
BUN/Creatinine Ratio: 14 (ref 9–23)
BUN: 16 mg/dL (ref 6–24)
Bilirubin Total: 0.4 mg/dL (ref 0.0–1.2)
CO2: 26 mmol/L (ref 20–29)
Calcium: 9.7 mg/dL (ref 8.7–10.2)
Chloride: 103 mmol/L (ref 96–106)
Creatinine, Ser: 1.13 mg/dL — ABNORMAL HIGH (ref 0.57–1.00)
GFR calc Af Amer: 61 mL/min/{1.73_m2} (ref >59)
GFR calc non Af Amer: 53 mL/min/{1.73_m2} — ABNORMAL LOW (ref >59)
Globulin, Total: 2.9 g/dL (ref 1.5–4.5)
Glucose: 129 mg/dL — ABNORMAL HIGH (ref 65–99)
Potassium: 4.7 mmol/L (ref 3.5–5.2)
Sodium: 142 mmol/L (ref 134–144)
Total Protein: 6.8 g/dL (ref 6.0–8.5)

## 2019-07-26 LAB — HGB A1C W/O EAG: Hgb A1c MFr Bld: 7.5 % — ABNORMAL HIGH (ref 4.8–5.6)

## 2019-07-26 LAB — SPECIMEN STATUS REPORT

## 2019-08-21 ENCOUNTER — Encounter: Payer: Self-pay | Admitting: Family Medicine

## 2019-08-23 ENCOUNTER — Ambulatory Visit (INDEPENDENT_AMBULATORY_CARE_PROVIDER_SITE_OTHER): Payer: BC Managed Care – PPO | Admitting: Family Medicine

## 2019-08-23 ENCOUNTER — Other Ambulatory Visit: Payer: Self-pay

## 2019-08-23 ENCOUNTER — Other Ambulatory Visit (HOSPITAL_COMMUNITY)
Admission: RE | Admit: 2019-08-23 | Discharge: 2019-08-23 | Disposition: A | Discharge status: Home / Self Care | Payer: BC Managed Care – PPO | Source: Ambulatory Visit | Attending: Family Medicine | Admitting: Family Medicine

## 2019-08-23 ENCOUNTER — Encounter: Payer: Self-pay | Admitting: Family Medicine

## 2019-08-23 VITALS — BP 118/74 | HR 74 | Temp 98.6°F | Ht 66.0 in | Wt 229.0 lb

## 2019-08-23 DIAGNOSIS — Z1211 Encounter for screening for malignant neoplasm of colon: Secondary | ICD-10-CM

## 2019-08-23 DIAGNOSIS — M65832 Other synovitis and tenosynovitis, left forearm: Secondary | ICD-10-CM | POA: Diagnosis not present

## 2019-08-23 DIAGNOSIS — Z124 Encounter for screening for malignant neoplasm of cervix: Secondary | ICD-10-CM | POA: Diagnosis not present

## 2019-08-23 DIAGNOSIS — F17218 Nicotine dependence, cigarettes, with other nicotine-induced disorders: Secondary | ICD-10-CM | POA: Diagnosis not present

## 2019-08-23 DIAGNOSIS — E1169 Type 2 diabetes mellitus with other specified complication: Secondary | ICD-10-CM

## 2019-08-23 DIAGNOSIS — E559 Vitamin D deficiency, unspecified: Secondary | ICD-10-CM

## 2019-08-23 DIAGNOSIS — Z0001 Encounter for general adult medical examination with abnormal findings: Secondary | ICD-10-CM

## 2019-08-23 DIAGNOSIS — M65932 Unspecified synovitis and tenosynovitis, left forearm: Secondary | ICD-10-CM

## 2019-08-23 DIAGNOSIS — R102 Pelvic and perineal pain: Secondary | ICD-10-CM

## 2019-08-23 DIAGNOSIS — Z1231 Encounter for screening mammogram for malignant neoplasm of breast: Secondary | ICD-10-CM

## 2019-08-23 DIAGNOSIS — R1084 Generalized abdominal pain: Secondary | ICD-10-CM

## 2019-08-23 DIAGNOSIS — I10 Essential (primary) hypertension: Secondary | ICD-10-CM

## 2019-08-23 DIAGNOSIS — M542 Cervicalgia: Secondary | ICD-10-CM | POA: Diagnosis not present

## 2019-08-23 DIAGNOSIS — Z23 Encounter for immunization: Secondary | ICD-10-CM

## 2019-08-23 DIAGNOSIS — E785 Hyperlipidemia, unspecified: Secondary | ICD-10-CM

## 2019-08-23 DIAGNOSIS — F17200 Nicotine dependence, unspecified, uncomplicated: Secondary | ICD-10-CM

## 2019-08-23 DIAGNOSIS — Z0000 Encounter for general adult medical examination without abnormal findings: Secondary | ICD-10-CM

## 2019-08-23 DIAGNOSIS — E1165 Type 2 diabetes mellitus with hyperglycemia: Secondary | ICD-10-CM

## 2019-08-23 DIAGNOSIS — Z794 Long term (current) use of insulin: Secondary | ICD-10-CM

## 2019-08-23 MED ORDER — TEMAZEPAM 15 MG PO CAPS: 15.0000 mg | ORAL_CAPSULE | Freq: Every day | ORAL | 4 refills | Status: DC

## 2019-08-23 MED ORDER — LORAZEPAM 1 MG PO TABS: ORAL_TABLET | ORAL | 5 refills | Status: DC

## 2019-08-23 MED ORDER — GLIPIZIDE ER 10 MG PO TB24: 10.0000 mg | ORAL_TABLET | Freq: Every day | ORAL | 1 refills | Status: DC

## 2019-08-23 NOTE — Patient Instructions (Addendum)
F/U in office with MD in end April, call if you need me sooner, shingrix # 1 at visit  Please schedule mammogram at checkout  Pneumonia 23 and flu vaccine today Microalb from office today ( if possible) if not with next lab draw You will be referred for colonoscopy to Dr Laural Golden , past due  You will be referred for pelvic US to  evaluate pelvic pain  Medication for fungal nail infection, terbinafine is prescribed for 12 weeks  Sensation is decreased in feet, please examine daily and have Podiatry cut your nails  Congrats on improved blood sugar, keep it up  We will give you info re Bariatric clinic in [place]  Now smoking 2 to 5 ciggs/ day, QUITTING is in sight, you CAN do this  Please get fasting lipid, cmp and EGFR, CBC, tSH and vit d 1 week before visit  Thanks for choosing [facility], we consider it a privelige to serve you. tomo screen

## 2019-08-23 NOTE — Assessment & Plan Note (Addendum)
Annual exam as documented. Counseling done  re healthy lifestyle involving commitment to 150 minutes exercise per week, heart healthy diet, and attaining healthy weight.The importance of adequate sleep also discussed.  Immunization and cancer screening needs are specifically addressed at this visit.  

## 2019-08-26 ENCOUNTER — Encounter: Payer: Self-pay | Admitting: Family Medicine

## 2019-08-27 ENCOUNTER — Inpatient Hospital Stay (HOSPITAL_COMMUNITY): Payer: BC Managed Care – PPO | Attending: Hematology

## 2019-08-27 ENCOUNTER — Other Ambulatory Visit: Payer: Self-pay

## 2019-08-27 ENCOUNTER — Ambulatory Visit (HOSPITAL_COMMUNITY)
Admission: RE | Admit: 2019-08-27 | Discharge: 2019-08-27 | Disposition: A | Discharge status: Home / Self Care | Payer: BC Managed Care – PPO | Source: Ambulatory Visit | Attending: Family Medicine | Admitting: Family Medicine

## 2019-08-27 ENCOUNTER — Other Ambulatory Visit: Payer: Self-pay | Admitting: Family Medicine

## 2019-08-27 ENCOUNTER — Inpatient Hospital Stay (HOSPITAL_COMMUNITY): Payer: BC Managed Care – PPO

## 2019-08-27 DIAGNOSIS — Z853 Personal history of malignant neoplasm of breast: Secondary | ICD-10-CM | POA: Diagnosis present

## 2019-08-27 DIAGNOSIS — Z923 Personal history of irradiation: Secondary | ICD-10-CM | POA: Diagnosis not present

## 2019-08-27 DIAGNOSIS — R102 Pelvic and perineal pain: Secondary | ICD-10-CM | POA: Diagnosis not present

## 2019-08-27 DIAGNOSIS — Z9221 Personal history of antineoplastic chemotherapy: Secondary | ICD-10-CM | POA: Diagnosis not present

## 2019-08-27 DIAGNOSIS — Z170 Estrogen receptor positive status [ER+]: Secondary | ICD-10-CM | POA: Diagnosis not present

## 2019-08-27 LAB — COMPREHENSIVE METABOLIC PANEL
ALT: 27 U/L (ref 0–44)
AST: 23 U/L (ref 15–41)
Albumin: 3.7 g/dL (ref 3.5–5.0)
Alkaline Phosphatase: 82 U/L (ref 38–126)
Anion gap: 7 (ref 5–15)
BUN: 15 mg/dL (ref 6–20)
CO2: 27 mmol/L (ref 22–32)
Calcium: 9.3 mg/dL (ref 8.9–10.3)
Chloride: 106 mmol/L (ref 98–111)
Creatinine, Ser: 0.91 mg/dL (ref 0.44–1.00)
GFR calc Af Amer: >60 mL/min (ref >60)
GFR calc non Af Amer: >60 mL/min (ref >60)
Glucose, Bld: 140 mg/dL — ABNORMAL HIGH (ref 70–99)
Potassium: 3.7 mmol/L (ref 3.5–5.1)
Sodium: 140 mmol/L (ref 135–145)
Total Bilirubin: 0.8 mg/dL (ref 0.3–1.2)
Total Protein: 7.3 g/dL (ref 6.5–8.1)

## 2019-08-27 LAB — CBC WITH DIFFERENTIAL/PLATELET
Abs Immature Granulocytes: 0.02 10*3/uL (ref 0.00–0.07)
Basophils Absolute: 0.1 10*3/uL (ref 0.0–0.1)
Basophils Relative: 1 %
Eosinophils Absolute: 0.3 10*3/uL (ref 0.0–0.5)
Eosinophils Relative: 4 %
HCT: 47.2 % — ABNORMAL HIGH (ref 36.0–46.0)
Hemoglobin: 15.8 g/dL — ABNORMAL HIGH (ref 12.0–15.0)
Immature Granulocytes: 0 %
Lymphocytes Relative: 42 %
Lymphs Abs: 3.7 10*3/uL (ref 0.7–4.0)
MCH: 33 pg (ref 26.0–34.0)
MCHC: 33.5 g/dL (ref 30.0–36.0)
MCV: 98.5 fL (ref 80.0–100.0)
Monocytes Absolute: 0.8 10*3/uL (ref 0.1–1.0)
Monocytes Relative: 9 %
Neutro Abs: 3.9 10*3/uL (ref 1.7–7.7)
Neutrophils Relative %: 44 %
Platelets: 378 10*3/uL (ref 150–400)
RBC: 4.79 MIL/uL (ref 3.87–5.11)
RDW: 13.1 % (ref 11.5–15.5)
WBC: 8.8 10*3/uL (ref 4.0–10.5)
nRBC: 0 % (ref 0.0–0.2)

## 2019-08-27 NOTE — Telephone Encounter (Signed)
Was she supposed to have an xray as well?

## 2019-08-28 ENCOUNTER — Encounter: Payer: Self-pay | Admitting: Family Medicine

## 2019-08-28 ENCOUNTER — Encounter (INDEPENDENT_AMBULATORY_CARE_PROVIDER_SITE_OTHER): Payer: Self-pay | Admitting: *Deleted

## 2019-08-28 ENCOUNTER — Other Ambulatory Visit: Payer: Self-pay

## 2019-08-28 DIAGNOSIS — R102 Pelvic and perineal pain: Secondary | ICD-10-CM | POA: Insufficient documentation

## 2019-08-28 DIAGNOSIS — M65932 Unspecified synovitis and tenosynovitis, left forearm: Secondary | ICD-10-CM | POA: Insufficient documentation

## 2019-08-28 DIAGNOSIS — M542 Cervicalgia: Secondary | ICD-10-CM | POA: Insufficient documentation

## 2019-08-28 DIAGNOSIS — M65832 Other synovitis and tenosynovitis, left forearm: Secondary | ICD-10-CM | POA: Insufficient documentation

## 2019-08-28 LAB — CYTOLOGY - PAP
Adequacy: ABSENT
Comment: NEGATIVE
Diagnosis: NEGATIVE
High risk HPV: NEGATIVE

## 2019-08-28 MED ORDER — ESOMEPRAZOLE MAGNESIUM 40 MG PO CPDR: DELAYED_RELEASE_CAPSULE | ORAL | 3 refills | Status: DC

## 2019-08-28 NOTE — Assessment & Plan Note (Signed)
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

## 2019-08-28 NOTE — Assessment & Plan Note (Signed)
Over 1 year h/o pelvic pain, refer for [place] of pelvis, she is s/p hysterectomy, and ahs h/o breast cancer and diverticulosis also severe dJD lumbar spine

## 2019-08-28 NOTE — Assessment & Plan Note (Signed)
Obesity linked with hypertension and diabetes, information re bariatric surgery provided

## 2019-08-28 NOTE — Assessment & Plan Note (Signed)
Annette [name] is reminded of the importance of commitment to daily physical activity for 30 minutes or more, as able and the need to limit carbohydrate intake to 30 to 60 grams per meal to help with blood sugar control.   The need to take medication as prescribed, test blood sugar as directed, and to call between visits if there is a concern that blood sugar is uncontrolled is also discussed.   Annette [name] is reminded of the importance of daily foot exam, annual eye examination, and good blood sugar, blood pressure and cholesterol control.  Diabetic Labs Latest Ref Rng & Units 08/27/2019 07/25/2019 06/04/2019 03/12/2019 11/21/2018  HbA1c 4.8 - 5.6 % - 7.5(H) - 8.3(H) 8.4(H)  Microalbumin Not Estab. ug/mL - - - - -  Micro/Creat Ratio 0.0 - 30.0 mg/g creat - - - - -  Chol 100 - 199 mg/dL - - - - 115  HDL >39 mg/dL - - - - 37(L)  Calc LDL 0 - 99 mg/dL - - - - 59  Triglycerides 0 - 149 mg/dL - - - - 93  Creatinine 0.44 - 1.00 mg/dL 0.91 1.13(H) 0.92 1.05(H) 1.01(H)   BP/Weight 08/23/2019 06/14/2019 06/08/2019 06/04/2019 03/22/2019 01/08/2019 [id number]  Systolic BP 123456 A999333 A999333 A999333 A999333 A999333 [id number]  Diastolic BP 74 60 68 51 60 60 60  Wt. (Lbs) 229 224 225.6 217 217 217 216.12  BMI 36.96 43.75 36.41 35.02 35.02 35.02 34.88   Foot/eye exam completion dates 08/23/2019 07/15/2014  Foot Form Completion Done Done   Improved, managed by Endo

## 2019-08-28 NOTE — Assessment & Plan Note (Signed)
Improved , managed by [facility] Ms. Sulak is reminded of the importance of commitment to daily physical activity for 30 minutes or more, as able and the need to limit carbohydrate intake to 30 to 60 grams per meal to help with blood sugar control.   The need to take medication as prescribed, test blood sugar as directed, and to call between visits if there is a concern that blood sugar is uncontrolled is also discussed.   Ms. Ledden is reminded of the importance of daily foot exam, annual eye examination, and good blood sugar, blood pressure and cholesterol control.  Diabetic Labs Latest Ref Rng & Units 08/27/2019 07/25/2019 06/04/2019 03/12/2019 11/21/2018  HbA1c 4.8 - 5.6 % - 7.5(H) - 8.3(H) 8.4(H)  Microalbumin Not Estab. ug/mL - - - - -  Micro/Creat Ratio 0.0 - 30.0 mg/g creat - - - - -  Chol 100 - 199 mg/dL - - - - 115  HDL >39 mg/dL - - - - 37(L)  Calc LDL 0 - 99 mg/dL - - - - 59  Triglycerides 0 - 149 mg/dL - - - - 93  Creatinine 0.44 - 1.00 mg/dL 0.91 1.13(H) 0.92 1.05(H) 1.01(H)   BP/Weight 08/23/2019 06/14/2019 06/08/2019 06/04/2019 03/22/2019 01/08/2019 [id number]  Systolic BP 123456 A999333 A999333 A999333 A999333 A999333 A999333  Diastolic BP 74 60 68 51 60 60 60  Wt. (Lbs) 229 224 225.6 217 217 217 216.12  BMI 36.96 43.75 36.41 35.02 35.02 35.02 34.88   Foot/eye exam completion dates 08/23/2019 07/15/2014  Foot Form Completion Done Done

## 2019-08-28 NOTE — Progress Notes (Signed)
Annette [name]     MRN: [id number]      DOB: 1961-04-21  HPI: Patient is in for annual physical exam. 1 month h/o left shoulder to elbow, and now  left thumb with electric shock  sensations But constantly aches C/o pelvic pain for over 1 year,  resulting in dyspareunia preventing sexual activity C/o ongoing weight gain, interested in looking at bariatric surgery, information is provided Recent labs, if available are reviewed. Immunization is reviewed , and  updated if needed.   PE: BP 118/74   Pulse 74   Temp 98.6 F (37 C) (Temporal)   Ht 5\' 6"  (1.676 m)   Wt 229 lb (103.9 kg)   SpO2 92%   BMI 36.96 kg/m   Pleasant  female, alert and oriented x 3, in no cardio-pulmonary distress. Afebrile. HEENT No facial trauma or asymetry. Sinuses non tender.  Extra occullar muscles intact.. External ears normal, . Neck: adequate ROM, no adenopathy,JVD or thyromegaly.No bruits.  Chest: Clear to ascultation bilaterally.No crackles or wheezes.Decreased air entry Non tender to palpation  Breast: Asymetric due to left lumpectomy,no masses or lumps. No tenderness. No nipple discharge or inversion. No axillary or supraclavicular adenopathy  Cardiovascular system; Heart sounds normal,  S1 and  S2 ,no S3.  No murmur, or thrill. Apical beat not displaced Peripheral pulses normal.  Abdomen: Soft, non tender, no organomegaly or masses. No bruits. Bowel sounds normal. No guarding, tenderness or rebound.   GU: External genitalia normal female genitalia , normal female distribution of hair. No lesions. Urethral meatus normal in size, no  Prolapse, no lesions visibly  Present. Bladder non tender. Vagina pink and moist , with no visible lesions , discharge present . Adequate pelvic support no  cystocele or rectocele noted  Uterus absent , no adnexal masses, no  adnexal tenderness.   Musculoskeletal exam: Decreased though adequate  ROM of lumbar spine,normal in  hips ,  and  knees.reduced in left shoulder  Left thumb tenosynovitis  Neurologic: Cranial nerves 2 to 12 intact. Power, tone ,sensation and reflexes normal throughout. No disturbance in gait. No tremor.  Skin: Intact, no ulceration, erythema , scaling or rash noted. Pigmentation normal throughout  Psych; Normal mood and affect. Judgement and concentration normal   Assessment & Plan:  Annual physical exam Annual exam as documented. Counseling done  re healthy lifestyle involving commitment to 150 minutes exercise per week, heart healthy diet, and attaining healthy weight.The importance of adequate sleep also discussed.  Immunization and cancer screening needs are specifically addressed at this visit.   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   The need to take medication as prescribed, test blood sugar as directed, and to call between visits if there is  a concern that blood sugar is uncontrolled is also discussed.   Annette [name] is reminded of the importance of daily foot exam, annual eye examination, and good blood sugar, blood pressure and cholesterol control.  Diabetic Labs Latest Ref Rng & Units 08/27/2019 07/25/2019 06/04/2019 03/12/2019 11/21/2018  HbA1c 4.8 -  5.6 % - 7.5(H) - 8.3(H) 8.4(H)  Microalbumin Not Estab. ug/mL - - - - -  Micro/Creat Ratio 0.0 - 30.0 mg/g creat - - - - -  Chol 100 - 199 mg/dL - - - - 115  HDL >39 mg/dL - - - - 37(L)  Calc LDL 0 - 99 mg/dL - - - - 59  Triglycerides 0 - 149 mg/dL - - - - 93  Creatinine 0.44 - 1.00 mg/dL 0.91 1.13(H) 0.92 1.05(H) 1.01(H)   BP/Weight 08/23/2019 06/14/2019 06/08/2019 06/04/2019 03/22/2019 01/08/2019 [id number]  Systolic BP 123456 A999333 A999333 A999333 A999333 A999333 A999333  Diastolic BP 74 60 68 51 60 60 60  Wt. (Lbs) 229 224 225.6 217 217 217 216.12  BMI 36.96 43.75 36.41 35.02 35.02 35.02 34.88   Foot/eye exam completion dates 08/23/2019 07/15/2014  Foot Form Completion Done Done   Improved, managed by Endo     Type 2 diabetes mellitus with other specified complication ([place]) Improved , managed by [facility] Annette [name] is reminded of the importance of commitment to daily physical activity for 30 minutes or more, as able and the need to limit carbohydrate intake to 30 to 60 grams per meal to help with blood sugar control.   The need to take medication as prescribed, test blood sugar as directed, and to call between visits if there is a concern that blood sugar is uncontrolled is also discussed.   Annette [name] is reminded of the importance of daily foot exam, annual eye examination, and good blood sugar, blood pressure and cholesterol control.  Diabetic Labs Latest Ref Rng & Units 08/27/2019 07/25/2019 06/04/2019 03/12/2019 11/21/2018  HbA1c 4.8 - 5.6 % - 7.5(H) - 8.3(H) 8.4(H)  Microalbumin Not Estab. ug/mL - - - - -  Micro/Creat Ratio 0.0 - 30.0 mg/g creat - - - - -  Chol 100 - 199 mg/dL - - - - 115  HDL >39 mg/dL - - - - 37(L)  Calc LDL 0 - 99 mg/dL - - - - 59  Triglycerides 0 - 149 mg/dL - - - - 93  Creatinine 0.44 - 1.00 mg/dL 0.91 1.13(H) 0.92 1.05(H) 1.01(H)   BP/Weight 08/23/2019 06/14/2019 06/08/2019 06/04/2019 03/22/2019 01/08/2019 [id number]  Systolic BP 123456 A999333 A999333 A999333 A999333 A999333 A999333  Diastolic BP 74 60 68 51 60  60 60  Wt. (Lbs) 229 224 225.6 217 217 217 216.12  BMI 36.96 43.75 36.41 35.02 35.02 35.02 34.88   Foot/eye exam completion dates 08/23/2019 07/15/2014  Foot Form Completion Done Done        Nicotine dependence Asked:confirms currently smokes cigarettes Assess: Unwilling to quit but cutting back Advise: needs to QUIT to reduce risk of cancer, cardio and cerebrovascular disease Assist: counseled for 5 minutes and literature provided Arrange: follow up in 3 months Order screening chest scan

## 2019-08-28 NOTE — Assessment & Plan Note (Signed)
Asked:confirms currently smokes cigarettes Assess: Unwilling to quit but cutting back Advise: needs to QUIT to reduce risk of cancer, cardio and cerebrovascular disease Assist: counseled for 5 minutes and literature provided Arrange: follow up in 3 months Order screening chest scan

## 2019-08-28 NOTE — Assessment & Plan Note (Signed)
Refer Ortho for eval and management

## 2019-09-03 ENCOUNTER — Ambulatory Visit (HOSPITAL_COMMUNITY): Payer: BLUE CROSS/BLUE SHIELD | Admitting: Hematology

## 2019-09-10 ENCOUNTER — Encounter: Payer: Self-pay | Admitting: Orthopedic Surgery

## 2019-09-10 ENCOUNTER — Ambulatory Visit (INDEPENDENT_AMBULATORY_CARE_PROVIDER_SITE_OTHER): Payer: BC Managed Care – PPO | Admitting: Orthopedic Surgery

## 2019-09-10 ENCOUNTER — Ambulatory Visit: Payer: BC Managed Care – PPO

## 2019-09-10 ENCOUNTER — Other Ambulatory Visit: Payer: Self-pay

## 2019-09-10 VITALS — Temp 97.2°F | Ht 66.0 in | Wt 229.0 lb

## 2019-09-10 DIAGNOSIS — G8929 Other chronic pain: Secondary | ICD-10-CM

## 2019-09-10 DIAGNOSIS — M79645 Pain in left finger(s): Secondary | ICD-10-CM | POA: Diagnosis not present

## 2019-09-10 NOTE — Patient Instructions (Addendum)
voltaren gel 4 x A DAY 2 GM FOR 6 WEEKS  (OVER THE COUNTER)  WEAR BRACE 6 WEEKS   [facility] ARTHRITIS OF THE THUMB

## 2019-09-10 NOTE — Progress Notes (Signed)
Annette [name]  09/10/2019  Body mass index is 36.96 kg/m.   HISTORY SECTION :  Chief Complaint  Patient presents with  . Hand Pain    left thumb    HPI The patient presents for evaluation of  (mild/moderate/severe/ ) severe pain, in the (right /left) thumb, for several weeks, associated with shocklike sensation running from the base of the thumb to the tip of the thumb with tenderness to touch.  Prior treatment none  Patient states she had some chest pain radiating to her left shoulder and arm when that went away the pain in the thumb started   Review of Systems  HENT: Positive for tinnitus.   Eyes: Positive for blurred vision.  Gastrointestinal: Positive for abdominal pain, constipation, diarrhea, heartburn and nausea.  Musculoskeletal: Positive for back pain, joint pain, myalgias and neck pain.  Neurological: Positive for dizziness and headaches.     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   Past Surgical History:  Procedure Laterality Date  . ABDOMINAL HYSTERECTOMY  1998   Benign, fibroids  . 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 APPENDECTOMY  06/12/08  . BREAST RECONSTRUCTION     Left reconstructive  . BREAST SURGERY  2007   Left lumpectomy  . BREAST SURGERY     Mammosite - right side  . CHOLECYSTECTOMY  1985  . ESOPHAGEAL DILATION  12/19/2015   Procedure: ESOPHAGEAL DILATION;  Surgeon: Rogene Houston, MD;  Location: [facility];  Service: Endoscopy;;  . ESOPHAGOGASTRODUODENOSCOPY N/A 12/19/2015   Procedure: ESOPHAGOGASTRODUODENOSCOPY (EGD);  Surgeon: Rogene Houston, MD;  Location: [facility];  Service:  Endoscopy;  Laterality: N/A;  11:40  . 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 THYROIDECTOMY, PARTIAL  11/05/2010   Benign disease    Body mass index is 36.96 kg/m.   Allergies  Allergen Reactions  . Penicillins Swelling    Has patient had a PCN reaction causing immediate rash, facial/tongue/throat swelling, SOB or lightheadedness with hypotension: Yes Has patient had a PCN reaction causing severe rash involving mucus membranes or skin necrosis: No Has patient had a PCN reaction that required hospitalization: No  Has patient had a PCN reaction occurring within the last 10 years: No If all of the above answers are "NO", then may proceed with Cephalosporin use.   Of face and tongue.  . Metformin And Related Nausea Only    Pt to discontinue med due to intolerance, cramps and diarrhea  . Statins Other (See Comments)    Major concerns re safety  . Tape Itching    Surgical adhesive  . Zetia [Ezetimibe] Other (See Comments)    Feels funny     Current Outpatient Medications:  .  aspirin EC 81 MG tablet, Take 81 mg by mouth daily., Disp: , Rfl:  .  Carboxymethylcellulose Sodium (EYE DROPS OP), Apply 1 drop to eye 2 (two) times daily as needed (FOR DRY EYE)., Disp: , Rfl:  .  clotrimazole-betamethasone (LOTRISONE) cream, APPLY TOPICALLY AS NEEDED (Patient taking differently: Apply 1 application topically daily as needed (for irritation). ), Disp: 30 g, Rfl: 1 .  esomeprazole (NEXIUM) 40 MG capsule, TAKE 1 CAPSULE BY MOUTH EVERY DAY, Disp: 90 capsule, Rfl: 3 .  glipiZIDE (GLUCOTROL XL) 10 MG 24 hr tablet, Take 1 tablet (10 mg total) by mouth daily with breakfast., Disp: 90 tablet, Rfl: 1 .  Insulin Glargine (LANTUS) 100 UNIT/ML Solostar Pen, Inject 30 Units into the skin at bedtime., Disp: 15 mL, Rfl: 2 .  LORazepam (ATIVAN) 1 MG tablet, TAKE ONE TABLET BY MOUTH EVERY 8 HOURS AS NEEDED FOR ANXIETY AND/OR NAUSEA, Disp: 30 tablet, Rfl: 5 .   Metoprolol Tartrate 37.5 MG TABS, Take 1 tablet by mouth 2 (two) times daily., Disp: 180 tablet, Rfl: 3 .  naproxen (NAPROSYN) 500 MG tablet, Take 1 tablet (500 mg total) by mouth 2 (two) times daily with a meal. (Patient taking differently: Take 500 mg by mouth 2 (two) times daily as needed for headache. ), Disp: 40 tablet, Rfl: 0 .  ONETOUCH VERIO test strip, USE TO TEST UP TO 4 TIMES PER DAY, Disp: 150 strip, Rfl: 5 .  rosuvastatin (CRESTOR) 20 MG tablet, Take 1 tablet (20 mg total) by mouth daily., Disp: 90 tablet, Rfl: 3 .  temazepam (RESTORIL) 15 MG capsule, Take 1 capsule (15 mg total) by mouth at bedtime., Disp: 30 capsule, Rfl: 4 .  traZODone (DESYREL) 100 MG tablet, Take 1 tablet (100 mg total) by mouth at bedtime., Disp: 90 tablet, Rfl: 3 .  triamterene-hydrochlorothiazide (DYAZIDE) 37.5-25 MG capsule, TAKE ONE CAPSULE BY MOUTH EVERY DAY, Disp: 90 capsule, Rfl: 1 .  venlafaxine XR (EFFEXOR-XR) 75 MG 24 hr capsule, Take 1 capsule (75 mg total) by mouth daily., Disp: 90 capsule, Rfl: 3 .  Vitamin D, Ergocalciferol, (DRISDOL) 1.25 MG (50000 UT) CAPS capsule, Take 1 capsule (50,000 Units total) by mouth every 7 (seven) days. 1 capsule by mouth weekly (Patient taking differently: Take 50,000 Units by mouth every Tuesday. 1 capsule by mouth weekly), Disp: 12 capsule, Rfl: 3 .  tobramycin (TOBREX) 0.3 % ophthalmic solution, Place 2 drops into the right eye every 4 (four) hours., Disp: 5 mL, Rfl: 0   PHYSICAL EXAM SECTION: 1) Temp (!) 97.2 F (36.2 C)   Ht 5\' 6"  (1.676 m)   Wt 229 lb (103.9 kg)   BMI 36.96 kg/m   Body mass index is 36.96 kg/m. General appearance: Well-developed well-nourished no gross deformities  2) Cardiovascular normal pulse and perfusion in the LEFT UPPER  extremities normal color without edema  3) Neurologically deep tendon reflexes are equal and normal, no sensation loss or deficits no pathologic reflexes  4) Psychological: Awake alert and oriented x3 mood and  affect normal  5) Skin no lacerations or ulcerations no nodularity no palpable masses, no erythema or nodularity  6) Musculoskeletal:   LEFT THUMB De Quervain's test was negative Tenderness at the [facility] joint Positive grinding test Range of motion normal Pinch strength normal Flexion and extension tendons normal    MEDICAL DECISION SECTION:  Encounter Diagnosis  Name Primary?  . Chronic pain of left thumb Yes    Imaging X-ray shows mild spur at the [facility] joint with symmetric joint space no narrowing  Plan:  (Rx., Inj., surg., Frx, MRI/CT, XR:2) Recommend bracing for 6 weeks and topical Voltaren gel since she is already on Naprosyn and has Nexium listed as a medication for reflux  Follow-up if needed   10:32 AM Arther Abbott, MD  09/10/2019

## 2019-09-14 ENCOUNTER — Ambulatory Visit (HOSPITAL_COMMUNITY)
Admission: RE | Admit: 2019-09-14 | Discharge: 2019-09-14 | Disposition: A | Discharge status: Home / Self Care | Payer: BC Managed Care – PPO | Source: Ambulatory Visit | Attending: Family Medicine | Admitting: Family Medicine

## 2019-09-14 ENCOUNTER — Other Ambulatory Visit: Payer: Self-pay

## 2019-09-14 DIAGNOSIS — M542 Cervicalgia: Secondary | ICD-10-CM

## 2019-09-14 DIAGNOSIS — M5442 Lumbago with sciatica, left side: Secondary | ICD-10-CM | POA: Insufficient documentation

## 2019-09-14 DIAGNOSIS — G8929 Other chronic pain: Secondary | ICD-10-CM | POA: Diagnosis present

## 2019-09-14 DIAGNOSIS — Z1231 Encounter for screening mammogram for malignant neoplasm of breast: Secondary | ICD-10-CM | POA: Insufficient documentation

## 2019-09-16 ENCOUNTER — Encounter: Payer: Self-pay | Admitting: Family Medicine

## 2019-09-19 ENCOUNTER — Ambulatory Visit (HOSPITAL_COMMUNITY): Payer: BC Managed Care – PPO | Admitting: Hematology

## 2019-09-25 ENCOUNTER — Other Ambulatory Visit: Payer: Self-pay | Admitting: Family Medicine

## 2019-09-25 DIAGNOSIS — G8929 Other chronic pain: Secondary | ICD-10-CM

## 2019-09-25 DIAGNOSIS — E785 Hyperlipidemia, unspecified: Secondary | ICD-10-CM

## 2019-09-25 DIAGNOSIS — M25552 Pain in left hip: Secondary | ICD-10-CM

## 2019-09-25 DIAGNOSIS — M5442 Lumbago with sciatica, left side: Secondary | ICD-10-CM

## 2019-09-29 ENCOUNTER — Other Ambulatory Visit: Payer: Self-pay | Admitting: Family Medicine

## 2019-10-03 ENCOUNTER — Encounter: Payer: Self-pay | Admitting: Family Medicine

## 2019-10-03 ENCOUNTER — Ambulatory Visit: Payer: BC Managed Care – PPO | Admitting: Orthopaedic Surgery

## 2019-10-04 ENCOUNTER — Encounter: Payer: Self-pay | Admitting: Family Medicine

## 2019-10-04 ENCOUNTER — Other Ambulatory Visit: Payer: Self-pay | Admitting: Family Medicine

## 2019-10-04 DIAGNOSIS — E1165 Type 2 diabetes mellitus with hyperglycemia: Secondary | ICD-10-CM

## 2019-10-04 NOTE — Progress Notes (Signed)
amb endo  

## 2019-10-21 NOTE — Progress Notes (Signed)
Cardiology Office Note:   Date:  10/22/2019  NAME:  Annette [name]    MRN: [id number] DOB:  08/25/1961   PCP:  Fayrene Helper, MD  Cardiologist:  Evalina Field, MD   Referring MD: Fayrene Helper, MD   Chief Complaint  Patient presents with  . Follow-up    4 months.   History of Present Illness:   Annette [name] is a 59 y.o. female with a hx of diabetes, arthritis, hyperlipidemia, anxiety who presents for follow-up.  She was evaluated in September for bradycardia and chest pain.  Ambulatory ECG showed no significant bradycardia arrhythmias.  Echocardiogram was normal.  She did did not want to undergo exercise treadmill stress testing.  Cardiac CTA was ordered but she did not do this.  She reports it simply got lost due to the busy year.  She continues to report 3-4 episodes of chest pain per week.  The episodes can occur while sitting and last hours.  There is no trigger that she has identified.  No alleviating factor.  The pain simply resolves after minutes to hours.  I went over the results of her echocardiogram which were normal.  Her Zio patch showed no significant bradycardia arrhythmias or any significant arrhythmias to be concerned about.  She still smoking half pack a day and has done so for 40 years.  Review of her blood pressure is normal and cholesterol profile looks phenomenal.  Associated symptoms with the chest pain include intermittent shortness of breath.  She denies any lower extreme edema, orthopnea, PND.  Problem List 1. Bradycardia  -zio patch with minimum HR 54 -echo normal LVEF 60% 2. Diabetes (A1c 7.5) 3. HLD total cholesterol 115, HDL 37, LDL 59, triglycerides 93  Past Medical History: Past Medical History:  Diagnosis Date  . Breast cancer ([place]) 2007   History of  XRT, onTamoxifen  . Bruises easily   . Depression   . Diabetes mellitus    prediabetic  . GERD (gastroesophageal reflux disease)   . Hypertriglyceridemia   . Hypothyroidism    following chemo amnd radiaition for breast cancer, needed replacement short term  . Hypothyroidism (acquired)    replaced x 1 year  . IBS (irritable bowel syndrome)   . Menieres disease    Controlled with triamterene   . Migraines   . Obesity   . Palpitations   . 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[place])   . Sleep apnea 2010   Problems with CPAP    Past Surgical History: Past Surgical History:  Procedure Laterality Date  . ABDOMINAL HYSTERECTOMY  1998   Benign, fibroids  . 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 APPENDECTOMY  06/12/08  . BREAST RECONSTRUCTION     Left reconstructive  . BREAST SURGERY  2007   Left lumpectomy  . BREAST SURGERY     Mammosite - right side  . CHOLECYSTECTOMY  1985  . ESOPHAGEAL DILATION  12/19/2015   Procedure: ESOPHAGEAL DILATION;  Surgeon: Rogene Houston, MD;  [facility]: [facility];  Service: Endoscopy;;  . ESOPHAGOGASTRODUODENOSCOPY N/A 12/19/2015   Procedure: ESOPHAGOGASTRODUODENOSCOPY (EGD);  Surgeon: Rogene Houston, MD;  Location: [facility];  Service: Endoscopy;  Laterality: N/A;  11:40  . 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 THYROIDECTOMY, PARTIAL  11/05/2010   Benign disease    Current Medications: Current Meds  Medication Sig  . aspirin EC 81 MG tablet Take  81 mg by mouth daily.  . Carboxymethylcellulose Sodium (EYE DROPS OP) Apply 1 drop to eye 2 (two) times daily as needed (FOR DRY EYE).  . clotrimazole-betamethasone (LOTRISONE) cream APPLY TOPICALLY AS NEEDED (Patient taking differently: Apply 1 application topically daily as needed (for irritation). )  . esomeprazole (NEXIUM) 40 MG capsule TAKE 1 CAPSULE BY MOUTH EVERY DAY  . glipiZIDE (GLUCOTROL XL) 10 MG 24 hr tablet Take 1 tablet (10 mg total) by mouth daily with breakfast.  . Insulin Glargine (LANTUS) 100 UNIT/ML Solostar Pen Inject 30 Units into the skin at bedtime.  Marland Kitchen LORazepam (ATIVAN) 1  MG tablet TAKE ONE TABLET BY MOUTH EVERY 8 HOURS AS NEEDED FOR ANXIETY AND/OR NAUSEA  . Metoprolol Tartrate 37.5 MG TABS TAKE 1 TABLET BY MOUTH TWICE DAILY  . naproxen (NAPROSYN) 500 MG tablet Take 1 tablet (500 mg total) by mouth 2 (two) times daily with a meal. (Patient taking differently: Take 500 mg by mouth 2 (two) times daily as needed for headache. )  . ONETOUCH VERIO test strip USE TO TEST UP TO 4 TIMES PER DAY  . rosuvastatin (CRESTOR) 20 MG tablet Take 1 tablet (20 mg total) by mouth daily.  . temazepam (RESTORIL) 15 MG capsule Take 1 capsule (15 mg total) by mouth at bedtime.  Marland Kitchen tobramycin (TOBREX) 0.3 % ophthalmic solution Place 2 drops into the right eye every 4 (four) hours.  . traZODone (DESYREL) 100 MG tablet Take 1 tablet (100 mg total) by mouth at bedtime.  . triamterene-hydrochlorothiazide (DYAZIDE) 37.5-25 MG capsule TAKE ONE CAPSULE BY MOUTH EVERY DAY  . venlafaxine XR (EFFEXOR-XR) 75 MG 24 hr capsule TAKE 1 CAPSULE(75 MG) BY MOUTH DAILY  . Vitamin D, Ergocalciferol, (DRISDOL) 1.25 MG (50000 UT) CAPS capsule Take 1 capsule (50,000 Units total) by mouth every 7 (seven) days. 1 capsule by mouth weekly (Patient taking differently: Take 50,000 Units by mouth every Tuesday. 1 capsule by mouth weekly)     Allergies:    Penicillins, Metformin and related, Statins, Tape, and Zetia [ezetimibe]   Social History: Social History   Socioeconomic History  . Marital status: Married    Spouse name: Not on file  . Number of children: Not on file  . Years of education: Not on file  . Highest education level: Not on file  Occupational History  . Not on file  Tobacco Use  . Smoking status: Current Every Day Smoker    Packs/day: 0.50    Years: 40.00    Pack years: 20.00    Types: Cigarettes  . Smokeless tobacco: Never Used  . Tobacco comment: 1/2 pack a day  Substance and Sexual Activity  . Alcohol use: No    Alcohol/week: 0.0 standard drinks    Comment: Encouraged to quit  smoking. She has tried the nicotone gum and patches but did not help  . Drug use: No  . Sexual activity: Yes    Birth control/protection: Surgical  Other Topics Concern  . Not on file  Social History Narrative  . Not on file   Social Determinants of Health   Financial Resource Strain:   . Difficulty of Paying Living Expenses: Not on file  Food Insecurity:   . Worried About Charity fundraiser in the Last Year: Not on file  . Ran Out of Food in the Last Year: Not on file  Transportation Needs:   . Lack of Transportation (Medical): Not on file  . Lack of Transportation (Non-Medical): Not on file  Physical Activity:   . Days of Exercise per Week: Not on file  . Minutes of Exercise per Session: Not on file  Stress:   . Feeling of Stress : Not on file  Social Connections:   . Frequency of Communication with Friends and Family: Not on file  . Frequency of Social Gatherings with Friends and Family: Not on file  . Attends Religious Services: Not on file  . Active Member of Clubs or Organizations: Not on file  . Attends Archivist Meetings: Not on file  . Marital Status: Not on file     Family History: The patient's family history includes Arrhythmia in her mother; Cancer in her paternal grandmother; Diabetes in her mother; Heart disease in her father.  ROS:   All other ROS reviewed and negative. Pertinent positives noted in the HPI.     EKGs/Labs/Other Studies Reviewed:   The following studies were personally reviewed by me today:  TTE 06/18/2019  1. The left ventricle has normal systolic function with an ejection fraction of 60-65%. The cavity size was normal. Left ventricular diastolic parameters were normal. No evidence of left ventricular regional wall motion abnormalities.  2. The right ventricle has normal systolic function. The cavity was normal. There is no increase in right ventricular wall thickness. Right ventricular systolic pressure could not be assessed.   3. Trivial pericardial effusion is present.  4. No evidence of mitral valve stenosis.  5. The aortic valve is tricuspid. No stenosis of the aortic valve.  6. The aorta is normal unless otherwise noted.  7. The aortic root, ascending aorta and aortic arch are normal in size and structure.  Zio 07/08/2019 Patient had a min HR of 54 bpm (sinus bradycardia), max HR of 112 bpm (sinus tachycardia), and avg HR of 74 bpm (normal sinus rhythm). Predominant underlying rhythm was Sinus Rhythm. Isolated SVEs were rare (<1.0%), SVE Couplets were rare (<1.0%), and no SVE Triplets were present. Isolated VEs were rare (<1.0%), and no VE Couplets or VE Triplets were present. No atrial fibrillation or flutter.   There was 1 patient triggered event associated with diary entry.   06/14/2019 5:46 PM: Symptoms of dizziness reported that coincided with normal sinus rhythm @ 86 bpm.   Impression:  1. No significant arrhythmias detected.  2. Symptoms of dizziness do not appear to be related to any arrhythmia.    Recent Labs: 11/21/2018: TSH 1.680 08/27/2019: ALT 27; BUN 15; Creatinine, Ser 0.91; Hemoglobin 15.8; Platelets 378; Potassium 3.7; Sodium 140   Recent Lipid Panel    Component Value Date/Time   CHOL 115 11/21/2018 0950   TRIG 93 11/21/2018 0950   HDL 37 (L) 11/21/2018 0950   CHOLHDL 3.1 11/21/2018 0950   CHOLHDL 5.0 11/15/2014 1119   VLDL 25 11/15/2014 1119   LDLCALC 59 11/21/2018 0950    Physical Exam:   VS:  BP 92/64 (BP Location: Left Arm, Patient Position: Sitting, Cuff Size: Normal)   Pulse 73   Temp (!) 97 F (36.1 C)   Ht 5\' 6"  (1.676 m)   Wt 228 lb (103.4 kg)   BMI 36.80 kg/m    Wt Readings from Last 3 Encounters:  10/22/19 228 lb (103.4 kg)  09/10/19 229 lb (103.9 kg)  08/23/19 229 lb (103.9 kg)    General: Well nourished, well developed, in no acute distress Heart: Atraumatic, normal size  Eyes: PEERLA, EOMI  Neck: Supple, no JVD Endocrine: No thryomegaly Cardiac:  Normal S1, S2; RRR; no  murmurs, rubs, or gallops Lungs: Clear to auscultation bilaterally, no wheezing, rhonchi or rales  Abd: Soft, nontender, no hepatomegaly  Ext: No edema, pulses 2+ Musculoskeletal: No deformities, BUE and BLE strength normal and equal Skin: Warm and dry, no rashes   Neuro: Alert and oriented to person, place, time, and situation, CNII-XII grossly intact, no focal deficits  Psych: Normal mood and affect   ASSESSMENT:   Annette [name] is a 59 y.o. female who presents for the following: 1. Chest pain, unspecified type   2. Mixed hyperlipidemia   3. Tobacco abuse     PLAN:   1. Chest pain, unspecified type -She continues to have intermittent atypical chest pain.  CVD risk factors include active smoking and diabetes.  Overall, I am concerned and would like to proceed with a cardiac CTA.  We did try to do this several months back but were unsuccessful.  We will try again.  She will take metoprolol tartrate 75 mg (2 tablets of what she currently takes) 2 hours before the scan.  She will get a BMP 1 week before the scan as well.  She is not on metformin and this should not be an issue with contrast. -She has had an echocardiogram that is normal -EKG showed no evidence of prior infarction -If her CTA is negative this could just be anxiety is the main issue here  2. Mixed hyperlipidemia -At goal for diabetes.  Continue Crestor 20 mg daily  3. Tobacco abuse -Smoking cessation counseling provided today  Disposition: Return in about 3 months (around 01/20/2020).  Medication Adjustments/Labs and Tests Ordered: Current medicines are reviewed at length with the patient today.  Concerns regarding medicines are outlined above.  Orders Placed This Encounter  Procedures  . CT CORONARY MORPH W/CTA COR W/SCORE W/CA W/CM &/OR WO/CM  . CT CORONARY FRACTIONAL FLOW RESERVE DATA PREP  . CT CORONARY FRACTIONAL FLOW RESERVE FLUID ANALYSIS  . Basic metabolic panel   No orders of the  defined types were placed in this encounter.   Patient Instructions  Medication Instructions:  Take two of your current Metoprolol medications ONLY 2 hours before your CT. *If you need a refill on your cardiac medications before your next appointment, please call your pharmacy*  Lab Work: BMET one week before CT If you have labs (blood work) drawn today and your tests are completely normal, you will receive your results only by: Marland Kitchen MyChart Message (if you have MyChart) OR . A paper copy in the mail If you have any lab test that is abnormal or we need to change your treatment, we will call you to review the results.  Testing/Procedures: Your physician has requested that you have cardiac CT. Cardiac computed tomography (CT) is a painless test that uses an x-ray machine to take clear, detailed pictures of your heart. For further information please visit HugeFiesta[facility][email or web address]. Please follow instruction sheet as given.  Follow-Up: At [facility], you and your health needs are our priority.  As part of our continuing mission to provide you with exceptional heart care, we have created designated Provider Care Teams.  These Care Teams include your primary Cardiologist (physician) and Advanced Practice Providers (APPs -  Physician Assistants and Nurse Practitioners) who all work together to provide you with the care you need, when you need it.  Your next appointment:   3 month(s)  The format for your next appointment:   In Person  Provider:   Eleonore Chiquito, MD  Other Instructions Your  cardiac CT will be scheduled at one of the below locations:   [facility] [address] [place], [place] [address] [phone]  If scheduled at [facility], please arrive at the [facility] main entrance of [facility] 30-45 minutes prior to test start time. Proceed to the [facility] Radiology Department (first floor) to check-in and test prep.  Please follow these  instructions carefully (unless otherwise directed):  Hold all erectile dysfunction medications at least 3 days (72 hrs) prior to test.  On the Night Before the Test: . Be sure to Drink plenty of water. . Do not consume any caffeinated/decaffeinated beverages or chocolate 12 hours prior to your test. . Do not take any antihistamines 12 hours prior to your test.  On the Day of the Test: . Drink plenty of water. Do not drink any water within one hour of the test. . Do not eat any food 4 hours prior to the test. . You may take your regular medications prior to the test.  . Take metoprolol (Lopressor) two hours prior to test. . HOLD Furosemide/Hydrochlorothiazide morning of the test. . FEMALES- please wear underwire-free bra if available       After the Test: . Drink plenty of water. . After receiving IV contrast, you may experience a mild flushed feeling. This is normal. . On occasion, you may experience a mild rash up to 24 hours after the test. This is not dangerous. If this occurs, you can take Benadryl 25 mg and increase your fluid intake. . If you experience trouble breathing, this can be serious. If it is severe call 911 IMMEDIATELY. If it is mild, please call our office. . If you take any of these medications: Glipizide/Metformin, Avandament, Glucavance, please do not take 48 hours after completing test unless otherwise instructed.   Once we have confirmed authorization from your insurance company, we will call you to set up a date and time for your test.   For non-scheduling related questions, please contact the cardiac imaging nurse navigator should you have any questions/concerns: Marchia Bond, RN Navigator Cardiac Imaging [facility] Heart and Vascular Services [phone] Office        Signed, Addison Naegeli. Audie Box, [place]  [address], [place], [place] [address] [phone]  10/22/2019 3:47 PM

## 2019-10-22 ENCOUNTER — Ambulatory Visit (INDEPENDENT_AMBULATORY_CARE_PROVIDER_SITE_OTHER): Payer: BC Managed Care – PPO | Admitting: Cardiovascular Disease

## 2019-10-22 ENCOUNTER — Other Ambulatory Visit: Payer: Self-pay

## 2019-10-22 ENCOUNTER — Encounter: Payer: Self-pay | Admitting: Cardiovascular Disease

## 2019-10-22 VITALS — BP 92/64 | HR 73 | Temp 97.0°F | Ht 66.0 in | Wt 228.0 lb

## 2019-10-22 DIAGNOSIS — R079 Chest pain, unspecified: Secondary | ICD-10-CM

## 2019-10-22 DIAGNOSIS — E782 Mixed hyperlipidemia: Secondary | ICD-10-CM

## 2019-10-22 DIAGNOSIS — Z720 Tobacco use: Secondary | ICD-10-CM | POA: Diagnosis not present

## 2019-10-22 NOTE — Patient Instructions (Addendum)
Medication Instructions:  Take two of your current Metoprolol medications ONLY 2 hours before your CT. *If you need a refill on your cardiac medications before your next appointment, please call your pharmacy*  Lab Work: BMET one week before CT If you have labs (blood work) drawn today and your tests are completely normal, you will receive your results only by: Marland Kitchen MyChart Message (if you have MyChart) OR . A paper copy in the mail If you have any lab test that is abnormal or we need to change your treatment, we will call you to review the results.  Testing/Procedures: Your physician has requested that you have cardiac CT. Cardiac computed tomography (CT) is a painless test that uses an x-ray machine to take clear, detailed pictures of your heart. For further information please visit HugeFiesta[facility][email or web address]. Please follow instruction sheet as given.  Follow-Up: At [facility], you and your health needs are our priority.  As part of our continuing mission to provide you with exceptional heart care, we have created designated Provider Care Teams.  These Care Teams include your primary Cardiologist (physician) and Advanced Practice Providers (APPs -  Physician Assistants and Nurse Practitioners) who all work together to provide you with the care you need, when you need it.  Your next appointment:   3 month(s)  The format for your next appointment:   In Person  Provider:   Eleonore Chiquito, MD  Other Instructions Your cardiac CT will be scheduled at one of the below locations:   [facility] [address] [place], [place] [address] [phone]  If scheduled at [facility], please arrive at the [facility] main entrance of [facility] 30-45 minutes prior to test start time. Proceed to the [facility] Radiology Department (first floor) to check-in and test prep.  Please follow these instructions carefully (unless otherwise directed):  Hold all erectile  dysfunction medications at least 3 days (72 hrs) prior to test.  On the Night Before the Test: . Be sure to Drink plenty of water. . Do not consume any caffeinated/decaffeinated beverages or chocolate 12 hours prior to your test. . Do not take any antihistamines 12 hours prior to your test.  On the Day of the Test: . Drink plenty of water. Do not drink any water within one hour of the test. . Do not eat any food 4 hours prior to the test. . You may take your regular medications prior to the test.  . Take metoprolol (Lopressor) two hours prior to test. . HOLD Furosemide/Hydrochlorothiazide morning of the test. . FEMALES- please wear underwire-free bra if available       After the Test: . Drink plenty of water. . After receiving IV contrast, you may experience a mild flushed feeling. This is normal. . On occasion, you may experience a mild rash up to 24 hours after the test. This is not dangerous. If this occurs, you can take Benadryl 25 mg and increase your fluid intake. . If you experience trouble breathing, this can be serious. If it is severe call 911 IMMEDIATELY. If it is mild, please call our office. . If you take any of these medications: Glipizide/Metformin, Avandament, Glucavance, please do not take 48 hours after completing test unless otherwise instructed.   Once we have confirmed authorization from your insurance company, we will call you to set up a date and time for your test.   For non-scheduling related questions, please contact the cardiac imaging nurse navigator should you  have any questions/concerns: Marchia Bond, RN Navigator Cardiac Imaging [facility] Heart and Vascular Services [phone] Office

## 2019-10-23 ENCOUNTER — Ambulatory Visit (INDEPENDENT_AMBULATORY_CARE_PROVIDER_SITE_OTHER): Payer: BC Managed Care – PPO | Admitting: Orthopaedic Surgery

## 2019-10-23 ENCOUNTER — Ambulatory Visit: Payer: Self-pay

## 2019-10-23 ENCOUNTER — Encounter: Payer: Self-pay | Admitting: Orthopaedic Surgery

## 2019-10-23 VITALS — BP 114/68 | HR 63 | Ht 66.0 in | Wt 228.0 lb

## 2019-10-23 DIAGNOSIS — M4807 Spinal stenosis, lumbosacral region: Secondary | ICD-10-CM

## 2019-10-23 DIAGNOSIS — M5416 Radiculopathy, lumbar region: Secondary | ICD-10-CM

## 2019-10-23 DIAGNOSIS — M48062 Spinal stenosis, lumbar region with neurogenic claudication: Secondary | ICD-10-CM

## 2019-10-23 DIAGNOSIS — M533 Sacrococcygeal disorders, not elsewhere classified: Secondary | ICD-10-CM | POA: Diagnosis not present

## 2019-10-23 DIAGNOSIS — G8929 Other chronic pain: Secondary | ICD-10-CM

## 2019-10-23 DIAGNOSIS — M25552 Pain in left hip: Secondary | ICD-10-CM
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
## 2019-10-26 ENCOUNTER — Other Ambulatory Visit: Payer: Self-pay

## 2019-10-26 MED ORDER — INSULIN GLARGINE 100 UNIT/ML SOLOSTAR PEN: 30.0000 [IU] | PEN_INJECTOR | Freq: Every day | SUBCUTANEOUS | 0 refills | Status: DC

## 2019-11-01 ENCOUNTER — Encounter: Payer: Self-pay | Admitting: Family Medicine

## 2019-11-01 ENCOUNTER — Emergency Department (HOSPITAL_COMMUNITY): Payer: BC Managed Care – PPO

## 2019-11-01 ENCOUNTER — Emergency Department (HOSPITAL_COMMUNITY)
Admission: EM | Admit: 2019-11-01 | Discharge: 2019-11-02 | DRG: 179 | Disposition: A | Discharge status: Home / Self Care | Payer: BC Managed Care – PPO | Attending: Internal Medicine | Admitting: Internal Medicine

## 2019-11-01 ENCOUNTER — Encounter (HOSPITAL_COMMUNITY): Payer: Self-pay | Admitting: *Deleted

## 2019-11-01 ENCOUNTER — Other Ambulatory Visit: Payer: Self-pay

## 2019-11-01 DIAGNOSIS — E781 Pure hyperglyceridemia: Secondary | ICD-10-CM | POA: Diagnosis not present

## 2019-11-01 DIAGNOSIS — E119 Type 2 diabetes mellitus without complications: Secondary | ICD-10-CM | POA: Diagnosis not present

## 2019-11-01 DIAGNOSIS — Z923 Personal history of irradiation: Secondary | ICD-10-CM

## 2019-11-01 DIAGNOSIS — Z7982 Long term (current) use of aspirin: Secondary | ICD-10-CM

## 2019-11-01 DIAGNOSIS — I959 Hypotension, unspecified: Secondary | ICD-10-CM

## 2019-11-01 DIAGNOSIS — R0902 Hypoxemia: Secondary | ICD-10-CM

## 2019-11-01 DIAGNOSIS — Z79810 Long term (current) use of selective estrogen receptor modulators (SERMs): Secondary | ICD-10-CM

## 2019-11-01 DIAGNOSIS — K219 Gastro-esophageal reflux disease without esophagitis: Secondary | ICD-10-CM | POA: Diagnosis not present

## 2019-11-01 DIAGNOSIS — E785 Hyperlipidemia, unspecified: Secondary | ICD-10-CM | POA: Diagnosis not present

## 2019-11-01 DIAGNOSIS — Z794 Long term (current) use of insulin: Secondary | ICD-10-CM | POA: Diagnosis not present

## 2019-11-01 DIAGNOSIS — E559 Vitamin D deficiency, unspecified: Secondary | ICD-10-CM | POA: Diagnosis not present

## 2019-11-01 DIAGNOSIS — K589 Irritable bowel syndrome without diarrhea: Secondary | ICD-10-CM | POA: Diagnosis present

## 2019-11-01 DIAGNOSIS — Z833 Family history of diabetes mellitus: Secondary | ICD-10-CM

## 2019-11-01 DIAGNOSIS — I10 Essential (primary) hypertension: Secondary | ICD-10-CM | POA: Diagnosis present

## 2019-11-01 DIAGNOSIS — F17210 Nicotine dependence, cigarettes, uncomplicated: Secondary | ICD-10-CM | POA: Diagnosis present

## 2019-11-01 DIAGNOSIS — Z853 Personal history of malignant neoplasm of breast: Secondary | ICD-10-CM | POA: Diagnosis not present

## 2019-11-01 DIAGNOSIS — Z79899 Other long term (current) drug therapy: Secondary | ICD-10-CM

## 2019-11-01 DIAGNOSIS — K580 Irritable bowel syndrome with diarrhea: Secondary | ICD-10-CM | POA: Diagnosis present

## 2019-11-01 DIAGNOSIS — F411 Generalized anxiety disorder: Secondary | ICD-10-CM | POA: Diagnosis not present

## 2019-11-01 DIAGNOSIS — Z9081 Acquired absence of spleen: Secondary | ICD-10-CM

## 2019-11-01 DIAGNOSIS — Z9049 Acquired absence of other specified parts of digestive tract: Secondary | ICD-10-CM | POA: Diagnosis not present

## 2019-11-01 DIAGNOSIS — Z90710 Acquired absence of both cervix and uterus: Secondary | ICD-10-CM

## 2019-11-01 DIAGNOSIS — G4733 Obstructive sleep apnea (adult) (pediatric): Secondary | ICD-10-CM | POA: Diagnosis present

## 2019-11-01 DIAGNOSIS — U071 COVID-19: Secondary | ICD-10-CM | POA: Diagnosis present

## 2019-11-01 DIAGNOSIS — H8109 Meniere's disease, unspecified ear: Secondary | ICD-10-CM | POA: Diagnosis present

## 2019-11-01 DIAGNOSIS — Z9221 Personal history of antineoplastic chemotherapy: Secondary | ICD-10-CM | POA: Diagnosis not present

## 2019-11-01 DIAGNOSIS — G4730 Sleep apnea, unspecified: Secondary | ICD-10-CM | POA: Diagnosis not present

## 2019-11-01 DIAGNOSIS — F329 Major depressive disorder, single episode, unspecified: Secondary | ICD-10-CM | POA: Diagnosis present

## 2019-11-01 DIAGNOSIS — E039 Hypothyroidism, unspecified: Secondary | ICD-10-CM | POA: Diagnosis present

## 2019-11-01 LAB — CBC WITH DIFFERENTIAL/PLATELET
Abs Immature Granulocytes: 0.02 10*3/uL (ref 0.00–0.07)
Basophils Absolute: 0.1 10*3/uL (ref 0.0–0.1)
Basophils Relative: 1 %
Eosinophils Absolute: 0.3 10*3/uL (ref 0.0–0.5)
Eosinophils Relative: 3 %
HCT: 49.3 % — ABNORMAL HIGH (ref 36.0–46.0)
Hemoglobin: 16.4 g/dL — ABNORMAL HIGH (ref 12.0–15.0)
Immature Granulocytes: 0 %
Lymphocytes Relative: 41 %
Lymphs Abs: 3.7 10*3/uL (ref 0.7–4.0)
MCH: 32.8 pg (ref 26.0–34.0)
MCHC: 33.3 g/dL (ref 30.0–36.0)
MCV: 98.6 fL (ref 80.0–100.0)
Monocytes Absolute: 0.8 10*3/uL (ref 0.1–1.0)
Monocytes Relative: 9 %
Neutro Abs: 4.2 10*3/uL (ref 1.7–7.7)
Neutrophils Relative %: 46 %
Platelets: 311 10*3/uL (ref 150–400)
RBC: 5 MIL/uL (ref 3.87–5.11)
RDW: 13.2 % (ref 11.5–15.5)
WBC: 9.1 10*3/uL (ref 4.0–10.5)
nRBC: 0.2 % (ref 0.0–0.2)

## 2019-11-01 LAB — URINALYSIS, ROUTINE W REFLEX MICROSCOPIC
Bilirubin Urine: NEGATIVE
Glucose, UA: NEGATIVE mg/dL
Hgb urine dipstick: NEGATIVE
Ketones, ur: NEGATIVE mg/dL
Leukocytes,Ua: NEGATIVE
Nitrite: NEGATIVE
Protein, ur: NEGATIVE mg/dL
Specific Gravity, Urine: 1.009 (ref 1.005–1.030)
pH: 7 (ref 5.0–8.0)

## 2019-11-01 LAB — COMPREHENSIVE METABOLIC PANEL
ALT: 27 U/L (ref 0–44)
AST: 27 U/L (ref 15–41)
Albumin: 3.8 g/dL (ref 3.5–5.0)
Alkaline Phosphatase: 86 U/L (ref 38–126)
Anion gap: 11 (ref 5–15)
BUN: 13 mg/dL (ref 6–20)
CO2: 23 mmol/L (ref 22–32)
Calcium: 9.2 mg/dL (ref 8.9–10.3)
Chloride: 105 mmol/L (ref 98–111)
Creatinine, Ser: 0.95 mg/dL (ref 0.44–1.00)
GFR calc Af Amer: >60 mL/min (ref >60)
GFR calc non Af Amer: >60 mL/min (ref >60)
Glucose, Bld: 178 mg/dL — ABNORMAL HIGH (ref 70–99)
Potassium: 4.1 mmol/L (ref 3.5–5.1)
Sodium: 139 mmol/L (ref 135–145)
Total Bilirubin: 0.6 mg/dL (ref 0.3–1.2)
Total Protein: 7.4 g/dL (ref 6.5–8.1)

## 2019-11-01 LAB — TROPONIN I (HIGH SENSITIVITY): Troponin I (High Sensitivity): <2 ng/L (ref <18)

## 2019-11-01 LAB — FERRITIN: Ferritin: 98 ng/mL (ref 11–307)

## 2019-11-01 LAB — LACTATE DEHYDROGENASE: LDH: 142 U/L (ref 98–192)

## 2019-11-01 LAB — C-REACTIVE PROTEIN: CRP: 0.6 mg/dL (ref <1.0)

## 2019-11-01 LAB — BRAIN NATRIURETIC PEPTIDE: B Natriuretic Peptide: 46 pg/mL (ref 0.0–100.0)

## 2019-11-01 LAB — D-DIMER, QUANTITATIVE: D-Dimer, Quant: 0.81 ug/mL-FEU — ABNORMAL HIGH (ref 0.00–0.50)

## 2019-11-01 MED ORDER — TRIAMTERENE-HCTZ 37.5-25 MG PO CAPS
1.0000 | ORAL_CAPSULE | Freq: Every morning | ORAL | Status: DC
  Filled 2019-11-01: qty 1

## 2019-11-01 MED ORDER — SODIUM CHLORIDE 0.9 % IV SOLN
200.0000 mg | Freq: Once | INTRAVENOUS | Status: DC
  Filled 2019-11-01: qty 40

## 2019-11-01 MED ORDER — ALBUTEROL SULFATE HFA 108 (90 BASE) MCG/ACT IN AERS: 2.0000 | INHALATION_SPRAY | Freq: Four times a day (QID) | RESPIRATORY_TRACT | Status: DC

## 2019-11-01 MED ORDER — ASPIRIN EC 81 MG PO TBEC: 81.0000 mg | DELAYED_RELEASE_TABLET | Freq: Every day | ORAL | Status: DC

## 2019-11-01 MED ORDER — INSULIN GLARGINE 100 UNIT/ML ~~LOC~~ SOLN
30.0000 [IU] | Freq: Every day | SUBCUTANEOUS | Status: DC
  Filled 2019-11-01: qty 0.3

## 2019-11-01 MED ORDER — LORAZEPAM 1 MG PO TABS: 1.0000 mg | ORAL_TABLET | Freq: Three times a day (TID) | ORAL | Status: DC | PRN

## 2019-11-01 MED ORDER — ASCORBIC ACID 500 MG PO TABS: 500.0000 mg | ORAL_TABLET | Freq: Every day | ORAL | Status: DC

## 2019-11-01 MED ORDER — ONDANSETRON HCL 4 MG/2ML IJ SOLN
4.0000 mg | Freq: Once | INTRAMUSCULAR | Status: AC
  Administered 2019-11-01: 4 mg via INTRAVENOUS
  Filled 2019-11-01: qty 2

## 2019-11-01 MED ORDER — ROSUVASTATIN CALCIUM 10 MG PO TABS
20.0000 mg | ORAL_TABLET | Freq: Every day | ORAL | Status: DC
  Filled 2019-11-01: qty 2

## 2019-11-01 MED ORDER — MAGNESIUM CITRATE PO SOLN: 1.0000 | Freq: Once | ORAL | Status: DC | PRN

## 2019-11-01 MED ORDER — METOPROLOL TARTRATE 25 MG PO TABS: 37.5000 mg | ORAL_TABLET | Freq: Two times a day (BID) | ORAL | Status: DC

## 2019-11-01 MED ORDER — SODIUM CHLORIDE 0.9 % IV BOLUS
1000.0000 mL | Freq: Once | INTRAVENOUS | Status: AC
  Administered 2019-11-01: 1000 mL via INTRAVENOUS

## 2019-11-01 MED ORDER — GUAIFENESIN-DM 100-10 MG/5ML PO SYRP: 10.0000 mL | ORAL_SOLUTION | ORAL | Status: DC | PRN

## 2019-11-01 MED ORDER — TRAZODONE HCL 50 MG PO TABS: 100.0000 mg | ORAL_TABLET | Freq: Every day | ORAL | Status: DC

## 2019-11-01 MED ORDER — TEMAZEPAM 15 MG PO CAPS: 15.0000 mg | ORAL_CAPSULE | Freq: Every day | ORAL | Status: DC

## 2019-11-01 MED ORDER — ZINC SULFATE 220 (50 ZN) MG PO CAPS: 220.0000 mg | ORAL_CAPSULE | Freq: Every day | ORAL | Status: DC

## 2019-11-01 MED ORDER — HYDROCOD POLST-CPM POLST ER 10-8 MG/5ML PO SUER: 5.0000 mL | Freq: Two times a day (BID) | ORAL | Status: DC | PRN

## 2019-11-01 MED ORDER — BISACODYL 10 MG RE SUPP: 10.0000 mg | Freq: Every day | RECTAL | Status: DC | PRN

## 2019-11-01 MED ORDER — DEXAMETHASONE SODIUM PHOSPHATE 10 MG/ML IJ SOLN: 6.0000 mg | INTRAMUSCULAR | Status: DC

## 2019-11-01 MED ORDER — NAPROXEN 250 MG PO TABS
500.0000 mg | ORAL_TABLET | Freq: Once | ORAL | Status: AC
  Administered 2019-11-01: 500 mg via ORAL
  Filled 2019-11-01: qty 2

## 2019-11-01 MED ORDER — ENOXAPARIN SODIUM 40 MG/0.4ML ~~LOC~~ SOLN: 40.0000 mg | SUBCUTANEOUS | Status: DC

## 2019-11-01 MED ORDER — LACTATED RINGERS IV SOLN: INTRAVENOUS | Status: DC

## 2019-11-01 MED ORDER — POLYETHYLENE GLYCOL 3350 17 G PO PACK: 17.0000 g | PACK | Freq: Every day | ORAL | Status: DC | PRN

## 2019-11-01 MED ORDER — ONDANSETRON HCL 4 MG PO TABS: 4.0000 mg | ORAL_TABLET | Freq: Four times a day (QID) | ORAL | Status: DC | PRN

## 2019-11-01 MED ORDER — ENOXAPARIN SODIUM 60 MG/0.6ML ~~LOC~~ SOLN: 50.0000 mg | SUBCUTANEOUS | Status: DC

## 2019-11-01 MED ORDER — SODIUM CHLORIDE 0.9 % IV SOLN: 100.0000 mg | Freq: Every day | INTRAVENOUS | Status: DC

## 2019-11-01 MED ORDER — ONDANSETRON HCL 4 MG/2ML IJ SOLN: 4.0000 mg | Freq: Four times a day (QID) | INTRAMUSCULAR | Status: DC | PRN

## 2019-11-01 MED ORDER — PANTOPRAZOLE SODIUM 40 MG PO TBEC: 40.0000 mg | DELAYED_RELEASE_TABLET | Freq: Every day | ORAL | Status: DC

## 2019-11-01 MED ORDER — BENZONATATE 100 MG PO CAPS: 100.0000 mg | ORAL_CAPSULE | Freq: Three times a day (TID) | ORAL | 0 refills | Status: DC

## 2019-11-01 MED ORDER — VENLAFAXINE HCL ER 37.5 MG PO CP24: 75.0000 mg | ORAL_CAPSULE | Freq: Every day | ORAL | Status: DC

## 2019-11-01 MED ORDER — ALBUTEROL SULFATE HFA 108 (90 BASE) MCG/ACT IN AERS
2.0000 | INHALATION_SPRAY | Freq: Once | RESPIRATORY_TRACT | Status: AC
  Administered 2019-11-01: 2 via RESPIRATORY_TRACT
  Filled 2019-11-01: qty 6.7

## 2019-11-01 NOTE — ED Notes (Addendum)
PA to room to reassess pt and noticed pt saturation decreased to 89%. Pt was mid 29's prior.  PA placed pt on 2l [place].

## 2019-11-01 NOTE — ED Provider Notes (Addendum)
[facility] EMERGENCY DEPARTMENT Provider Note   CSN: [id number] Arrival date & time: 11/01/19  1554     History Chief Complaint  Patient presents with  . Hypotension    Annette [name] is a 59 y.o. female.  HPI    Pt is a 59 y/o female with a h/o breast cancer, diabetes, GERD, hypertriglyceridemia, migraines, sleep apnea, pituitary insufficiency, who presents to the ED today for eval of hypotension.  Patient states she became symptomatic of Covid about 4 days ago.  She has been having rhinorrhea, nasal congestion, dry cough, shortness of breath, chest pressure, nausea and diarrhea.  She was tested 2 days ago and was positive.  She also reports lightheadedness and weakness.  She denies any vomiting or fevers.  She states she is been eating and drinking normally.  States when she was tested for Covid at urgent care her blood pressure was in the 123XX123 systolic.  She was advised to go to the ED but she did not want to at that time.  She called her PCP who also advised her to come here for evaluation.  She is on triamterene.   Past Medical History:  Diagnosis Date  . Breast cancer ([place]) 2007   History of  XRT, onTamoxifen  . Bruises easily   . Depression   . Diabetes mellitus    prediabetic  . GERD (gastroesophageal reflux disease)   . Hypertriglyceridemia   . Hypothyroidism    following chemo amnd radiaition for breast cancer, needed replacement short term  . Hypothyroidism (acquired)    replaced x 1 year  . IBS (irritable bowel syndrome)   . Menieres disease    Controlled with triamterene   . Migraines   . Obesity   . Palpitations   . 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[place])   . Sleep apnea 2010   Problems with CPAP    Patient Active Problem List   Diagnosis Date Noted  . Hypoxia 11/01/2019  . COVID-19 virus infection 11/01/2019  . COVID-19 11/01/2019  . Pelvic pain 08/28/2019  . Neck pain on left side 08/28/2019  . Extensor tenosynovitis of wrist, left 08/28/2019  . Morbid  obesity ([place]) 03/25/2019  . Back pain 03/25/2019  . Light headedness 01/08/2019  . At risk for cardiovascular event 01/30/2018  . Annual physical exam 03/30/2015  . Palpitations 03/27/2015  . Hyperlipidemia LDL goal <100 07/15/2014  . GERD (gastroesophageal reflux disease) 10/09/2013  . IBS (irritable bowel syndrome) 10/09/2013  . GAD (generalized anxiety disorder) 05/14/2013  . Metabolic syndrome X 123XX123  . HTN, goal below 130/80 01/09/2013  . Nicotine dependence 01/09/2013  . Insomnia 08/14/2012  . Type 2 diabetes mellitus with other specified complication ([place]) [id number]  . Adrenal adenoma 08/06/2011  . Meniere's disease 04/22/2011  . Sleep apnea 04/22/2011  . ABNORMAL THYROID FUNCTION TESTS 08/17/2010  . Vitamin D deficiency 01/15/2010  . IBS 01/01/2010  . BREAST CANCER, HX OF 01/01/2010    Past Surgical History:  Procedure Laterality Date  . ABDOMINAL HYSTERECTOMY  1998   Benign, fibroids  . 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 APPENDECTOMY  06/12/08  . BREAST RECONSTRUCTION     Left reconstructive  . BREAST SURGERY  2007   Left lumpectomy  . BREAST SURGERY     Mammosite - right side  . CHOLECYSTECTOMY  1985  . ESOPHAGEAL DILATION  12/19/2015   Procedure: ESOPHAGEAL DILATION;  Surgeon: Rogene Houston, MD;  Location: [facility] ENDO SU[facility];  Service: Endoscopy;;  . ESOPHAGOGASTRODUODENOSCOPY N/A 12/19/2015   Procedure: ESOPHAGOGASTRODUODENOSCOPY (EGD);  Surgeon: Rogene Houston, MD;  Location: [facility] ENDO SUITE;  Service: Endoscopy;  Laterality: N/A;  11:40  . 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 THYROIDECTOMY, PARTIAL  11/05/2010   Benign disease     OB History   No obstetric history on file.     Family History  Problem Relation Age of Onset  . Diabetes Mother   . Arrhythmia Mother   . Heart disease Father   . Cancer Paternal Grandmother        Breast    Social  History   Tobacco Use  . Smoking status: Current Every Day Smoker    Packs/day: 0.50    Years: 40.00    Pack years: 20.00    Types: Cigarettes  . Smokeless tobacco: Never Used  . Tobacco comment: 1/2 pack a day  Substance Use Topics  . Alcohol use: No    Alcohol/week: 0.0 standard drinks    Comment: Encouraged to quit smoking. She has tried the nicotone gum and patches but did not help  . Drug use: No    Home Medications Prior to Admission medications   Medication Sig Start Date End Date Taking? Authorizing Provider  aspirin EC 81 MG tablet Take 81 mg by mouth daily.   Yes [provider]  brompheniramine-pseudoephedrine-DM 30-2-10 MG/5ML syrup Take 5 mLs by mouth 3 (three) times daily as needed (for cough).  10/30/19  Yes [provider]  Carboxymethylcellulose Sodium (EYE DROPS OP) Apply 1 drop to eye 2 (two) times daily as needed (FOR DRY EYE).   Yes [provider]  esomeprazole (NEXIUM) 40 MG capsule TAKE 1 CAPSULE BY MOUTH EVERY DAY Patient taking differently: Take 40 mg by mouth every morning.  08/28/19  Yes Fayrene Helper, MD  glipiZIDE (GLUCOTROL XL) 10 MG 24 hr tablet Take 1 tablet (10 mg total) by mouth daily with breakfast. 08/23/19  Yes Fayrene Helper, MD  Insulin Glargine (LANTUS) 100 UNIT/ML Solostar Pen Inject 30 Units into the skin at bedtime. 10/26/19  Yes Nida, Marella Chimes, MD  Metoprolol Tartrate 37.5 MG TABS TAKE 1 TABLET BY MOUTH TWICE DAILY Patient taking differently: Take 37.5 mg by mouth 2 (two) times daily.  09/25/19  Yes Fayrene Helper, MD  naproxen (NAPROSYN) 500 MG tablet Take 1 tablet (500 mg total) by mouth 2 (two) times daily with a meal. Patient taking differently: Take 500 mg by mouth 2 (two) times daily as needed for headache.  07/25/18  Yes Fayrene Helper, MD  rosuvastatin (CRESTOR) 20 MG tablet Take 1 tablet (20 mg total) by mouth daily. Patient taking differently: Take 20 mg by mouth every morning.   01/08/19  Yes Fayrene Helper, MD  temazepam (RESTORIL) 15 MG capsule Take 1 capsule (15 mg total) by mouth at bedtime. 08/23/19  Yes Fayrene Helper, MD  traZODone (DESYREL) 100 MG tablet Take 1 tablet (100 mg total) by mouth at bedtime. 01/08/19  Yes Fayrene Helper, MD  triamterene-hydrochlorothiazide (DYAZIDE) 37.5-25 MG capsule TAKE ONE CAPSULE BY MOUTH EVERY DAY Patient taking differently: Take 1 capsule by mouth every morning.  07/02/19  Yes Fayrene Helper, MD  venlafaxine XR (EFFEXOR-XR) 75 MG 24 hr capsule TAKE 1 CAPSULE(75 MG) BY MOUTH DAILY Patient taking differently: Take 75 mg by mouth daily with breakfast.  10/01/19  Yes Fayrene Helper, MD  Vitamin  D, Ergocalciferol, (DRISDOL) 1.25 MG (50000 UT) CAPS capsule Take 1 capsule (50,000 Units total) by mouth every 7 (seven) days. 1 capsule by mouth weekly Patient taking differently: Take 50,000 Units by mouth every Tuesday.  03/22/19  Yes Fayrene Helper, MD  LORazepam (ATIVAN) 1 MG tablet TAKE ONE TABLET BY MOUTH EVERY 8 HOURS AS NEEDED FOR ANXIETY AND/OR NAUSEA Patient taking differently: Take 1 mg by mouth every 8 (eight) hours as needed for anxiety. AND/OR NAUSEA 08/23/19   Fayrene Helper, MD  ONETOUCH VERIO test strip USE TO TEST UP TO 4 TIMES PER DAY Patient not taking: Reported on 11/01/2019 08/27/19   Fayrene Helper, MD    Allergies    Penicillins, Metformin and related, Statins, Tape, and Zetia [ezetimibe]  Review of Systems   Review of Systems  Constitutional: Positive for fatigue. Negative for fever.  HENT: Positive for congestion and rhinorrhea.   Eyes: Negative for pain and visual disturbance.  Respiratory: Positive for cough and shortness of breath.   Cardiovascular: Positive for chest pain. Negative for palpitations.  Gastrointestinal: Positive for diarrhea and nausea. Negative for abdominal pain, constipation and vomiting.  Genitourinary: Negative for dysuria and hematuria.   Musculoskeletal: Negative for back pain.  Skin: Negative for rash.  Neurological: Positive for weakness (generalized). Negative for seizures and syncope.  All other systems reviewed and are negative.   Physical Exam Updated Vital Signs BP 101/67   Pulse 64   Temp 98.3 F (36.8 C) (Oral)   Resp 11   Ht 5\' 6"  (1.676 m)   Wt 103.4 kg   SpO2 98%   BMI 36.80 kg/m   Physical Exam Vitals and nursing note reviewed.  Constitutional:      General: She is not in acute distress.    Appearance: She is well-developed. She is not ill-appearing or toxic-appearing.  HENT:     Head: Normocephalic and atraumatic.     Mouth/Throat:     Mouth: Mucous membranes are dry.  Eyes:     Conjunctiva/sclera: Conjunctivae normal.  Cardiovascular:     Rate and Rhythm: Normal rate and regular rhythm.     Pulses: Normal pulses.     Heart sounds: Normal heart sounds. No murmur.  Pulmonary:     Effort: Pulmonary effort is normal. No respiratory distress.     Breath sounds: Normal breath sounds. No wheezing, rhonchi or rales.     Comments: Speaking in full sentences Abdominal:     General: Bowel sounds are normal. There is no distension.     Palpations: Abdomen is soft.     Tenderness: There is no abdominal tenderness. There is no guarding or rebound.  Musculoskeletal:     Cervical back: Neck supple.  Skin:    General: Skin is warm and dry.  Neurological:     Mental Status: She is alert.     ED Results / Procedures / Treatments   Labs (all labs ordered are listed, but only abnormal results are displayed) Labs Reviewed  CBC WITH DIFFERENTIAL/PLATELET - Abnormal; Notable for the following components:      Result Value   Hemoglobin 16.4 (*)    HCT 49.3 (*)    All other components within normal limits  COMPREHENSIVE METABOLIC PANEL - Abnormal; Notable for the following components:   Glucose, Bld 178 (*)    All other components within normal limits  D-DIMER, QUANTITATIVE (NOT AT [facility]) -  Abnormal; Notable for the following components:   D-Dimer, Quant 0.81 (*)  All other components within normal limits  URINALYSIS, ROUTINE W REFLEX MICROSCOPIC  LACTATE DEHYDROGENASE  BRAIN NATRIURETIC PEPTIDE  PROCALCITONIN  FERRITIN  C-REACTIVE PROTEIN  TROPONIN I (HIGH SENSITIVITY)    EKG EKG Interpretation  Date/Time:  Thursday November 01 2019 16:57:56 EST Ventricular Rate:  70 PR Interval:    QRS Duration: 102 QT Interval:  419 QTC Calculation: 453 R Axis:   59 Text Interpretation: Sinus rhythm Abnormal R-wave progression, early transition Baseline wander in lead(s) V1 Confirmed by Gerlene Fee [phone]) on 11/01/2019 6:33:18 PM   Radiology DG Chest Portable 1 View  Result Date: 11/01/2019 CLINICAL DATA:  Shortness of breath, COVID positive EXAM: PORTABLE CHEST 1 VIEW COMPARISON:  Chest radiograph dated 05/25/2017 FINDINGS: The heart size and mediastinal contours are within normal limits. Both lungs are clear. The visualized skeletal structures are unremarkable. Surgical clips overlie the left axilla. IMPRESSION: No active disease. Electronically Signed   By: Zerita Boers M.D.   On: 11/01/2019 17:19    Procedures Procedures (including critical care time)  11:49 PM Cardiac monitoring reveals HR 60s, NSR (Rate & rhythm), as reviewed and interpreted by me. Cardiac monitoring was ordered due to c/o SOB in the setting of COVID and to monitor patient for dysrhythmia.   Medications Ordered in ED Medications  ondansetron (ZOFRAN) injection 4 mg (4 mg Intravenous Given 11/01/19 1705)  sodium chloride 0.9 % bolus 1,000 mL (0 mLs Intravenous Stopped 11/01/19 2021)  albuterol (VENTOLIN HFA) 108 (90 Base) MCG/ACT inhaler 2 puff (2 puffs Inhalation Given 11/01/19 2020)  sodium chloride 0.9 % bolus 1,000 mL (1,000 mLs Intravenous New Bag/Given (Non-Interop) 11/01/19 2020)  naproxen (NAPROSYN) tablet 500 mg (500 mg Oral Given 11/01/19 2343)    ED Course  I have reviewed the triage  vital signs and the nursing notes.  Pertinent labs & imaging results that were available during my care of the patient were reviewed by me and considered in my medical decision making (see chart for details).    MDM Rules/Calculators/A&P                     59 year old female presenting for evaluation of hypotension.  Became symptomatic of Covid and was diagnosed 2 days ago.  Was noted to be hypotensive during her appointment at urgent care and was advised to come to ED at that time.  She did not want to do so and chose to follow-up with her PCP instead who also advised to come to the ED for evaluation.  On arrival BP 94/31.  Vital signs are otherwise reassuring.  On record review, patient's BP normally runs in the low 123XX123 systolic so this does not appear to be too much off of her baseline.  Exam reassuring other than she looks a little bit dehydrated. Lungs are clear and heart sounds are normal. abd soft and nontender.   CBC with elevated hemoglobin, could be due to hemoconcentration CMP with normal electrolytes, liver, and kidney function Trop neg  - Has been constant for several days therefore delta not required. Cp nonexertional and seems atypical for ACS. Seems more likely related to COVID sxs UA w/i evidence for infection   EKG with NSR, Abnormal R-wave progression, early transition Baseline wander in lead(s) V1   CXR with no evidence of pna, ptx or other acute abnormality  Pt given IVF and zofran. On reassessment pt states she does not feel much improved. Her sats are 87% on the monitor with good waveform. She was  placed on 2L. Her BP has improved some with IVF and she is almost at her baseline. Will admit for further tx of COVID with hypoxia.   10:30 PM CONSULT with Dr. Scherrie November who accepts patient for admission.   11:40 PM discussed case with Dr. Retta Diones. He trialed the pt off O2 and she maintained sats at 97%. He states pt prefers to go home and he is comfortable with her being  discharged. He recommends ambulating her again to recheck sats prior to final disposition decision.   11:PM Pt ambulated in the room and sats remained at 93% and above. She did not have any significant tachypnea. She now states she feels much improved than when she got here and she would like to go home. I feel this is reasonable since she appears to have had only transient hypoxia. Have advised her to get pulse ox for home and monitor sxs closely. Will give meds for symptom mgmt. Advised on close pcp f/u and return precautions. She voices understanding and is in agreement with plan. All questions answered, pt stable for d/c.   ----  Annette [name] was evaluated in Emergency Department on 11/01/2019 for the symptoms described in the history of present illness. She was evaluated in the context of the global COVID-19 pandemic, which necessitated consideration that the patient might be at risk for infection with the SARS-CoV-2 virus that causes COVID-19. Institutional protocols and algorithms that pertain to the evaluation of patients at risk for COVID-19 are in a state of rapid change based on information released by regulatory bodies including the CDC and federal and state organizations. These policies and algorithms were followed during the patient's care in the ED.   Final Clinical Impression(s) / ED Diagnoses Final diagnoses:  Hypoxia    Rx / DC Orders ED Discharge Orders    None       Rodney Booze, PA-C 11/01/19 2230    Rodney Booze, PA-C 11/01/19 2238    Maudie Flakes, MD 11/01/19 2248    Tivis Ringer, Arthuro Canelo S, PA-C 11/01/19 2349    Maudie Flakes, MD 11/05/19 [phone]

## 2019-11-01 NOTE — ED Notes (Signed)
Pt ambulated on RA at  91-92%

## 2019-11-01 NOTE — H&P (Signed)
History and Physical    Patient Demographics:    Annette [name] [id number] DOB: [birth year] DOA: 11/01/2019  PCP: Fayrene Helper, MD  Patient coming from: Home  I have personally briefly reviewed patient's old medical records in [place]  Chief Complaint: Shortness of breath   Assessment & Plan:     Assessment/Plan Principal Problem:   COVID-19 virus infection Active Problems:   Vitamin D deficiency   IBS   Meniere's disease   Sleep apnea   GAD (generalized anxiety disorder)   GERD (gastroesophageal reflux disease)   Hypoxia     Principal Problem: COVID-19 infection Patient recently diagnosed with Covid on 10/30/2019.  Has been having symptoms for last 4 days.  Worsened shortness of breath over last 2 days.  Has had persistent cough, diarrhea.  Noted to be hypotensive on presentation.  Good response to IV fluids.  No significant hypoxemia with saturations staying above 94% on room air even with ambulation.  Chest x-ray shows no evidence of pneumonia.  Inflammatory markers were normal with CRP 0.6, ferritin 98, procalcitonin negative, LDH 142, negative troponin, normal BNP.  Discussed with the patient.  She did not wish to stay in the hospital as she did not have any evidence of hypoxemia.  I did reach out to the ED physician who was agreeable to discharge the patient. Will recommend prescribing zinc, ascorbic acid and cholecalciferol for supportive care.  No indication for dexamethasone or remdesivir currently.  Other Active Problems:  Hypertension: -Continue metoprolol, triamterene, hydrochlorothiazide  Hyperlipidemia: -Continue Crestor  Diabetes mellitus type 2: -Hold oral hypoglycemics -Continue Lantus -Sliding scale insulin coverage and fingerstick monitoring  Anxiety/ Depression: -Continue venlafaxine, trazodone, temazepam  GERD: -Continue esomeprazole      HPI:     HPI: Annette [name] is a 59 y.o. female with medical history  significant of hypertension, hyperlipidemia, diabetes mellitus type 2, GERD, anxiety, depression who presented to the ER with increased shortness of breath.  Patient has been having cough, rhinorrhea, sore throat, headache over the last 4 days.  Also has had some nausea and diarrhea.  No vomiting.  Tested for Covid on 10/30/2019 was positive.  Noted to be mildly hypotensive on initial presentation.  Responded well to IV fluids.  No evidence of hypoxemia.  No seizures, syncope, focal deficits,   ED Course:  Vital Signs reviewed on presentation, significant for temperature 98.3, heart rate 70, blood pressure 101/67, saturation 98% on 2 L nasal cannula. Labs reviewed, significant for sodium 139, potassium 4.1, BUN 30, creatinine 0.9, LFTs with normal meds, troponin negative, WBC count 9.1, hemoglobin 16.4, Hematocrit 49, platelets 311. Imaging personally Reviewed, chest x-ray shows no obvious infiltrates. EKG personally reviewed, shows sinus rhythm, no acute ST-T changes.    Review of systems:    Review of Systems: As per HPI otherwise 10 point review of systems negative.  All other review of systems is negative except the ones noted above in the HPI.    Past Medical and Surgical History:  Reviewed by me  Past Medical History:  Diagnosis Date  . Breast cancer ([place]) 2007   History of  XRT, onTamoxifen  . Bruises easily   . Depression   . Diabetes mellitus    prediabetic  . GERD (gastroesophageal reflux disease)   . Hypertriglyceridemia   . Hypothyroidism    following chemo amnd radiaition for breast cancer, needed replacement short term  . Hypothyroidism (acquired)    replaced x 1 year  . IBS (  irritable bowel syndrome)   . Menieres disease    Controlled with triamterene   . Migraines   . Obesity   . Palpitations   . 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[place])   . Sleep apnea 2010   Problems with CPAP    Past Surgical History:  Procedure Laterality Date  . ABDOMINAL HYSTERECTOMY  1998    Benign, fibroids  . 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 APPENDECTOMY  06/12/08  . BREAST RECONSTRUCTION     Left reconstructive  . BREAST SURGERY  2007   Left lumpectomy  . BREAST SURGERY     Mammosite - right side  . CHOLECYSTECTOMY  1985  . ESOPHAGEAL DILATION  12/19/2015   Procedure: ESOPHAGEAL DILATION;  Surgeon: Rogene Houston, MD;  Location: [facility];  Service: Endoscopy;;  . ESOPHAGOGASTRODUODENOSCOPY N/A 12/19/2015   Procedure: ESOPHAGOGASTRODUODENOSCOPY (EGD);  Surgeon: Rogene Houston, MD;  Location: [facility];  Service: Endoscopy;  Laterality: N/A;  11:40  . 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 THYROIDECTOMY, PARTIAL  11/05/2010   Benign disease     Social History:  Reviewed by me   reports that she has been smoking cigarettes. She has a 20.00 pack-year smoking history. She has never used smokeless tobacco. She reports that she does not drink alcohol or use drugs.  Allergies:    Allergies  Allergen Reactions  . Penicillins Swelling    Has patient had a PCN reaction causing immediate rash, facial/tongue/throat swelling, SOB or lightheadedness with hypotension: Yes Has patient had a PCN reaction causing severe rash involving mucus membranes or skin necrosis: No Has patient had a PCN reaction that required hospitalization: No  Has patient had a PCN reaction occurring within the last 10 years: No If all of the above answers are "NO", then may proceed with Cephalosporin use.   Of face and tongue.  . Metformin And Related Nausea Only    Pt to discontinue med due to intolerance, cramps and diarrhea  . Statins Other (See Comments)    Major concerns re safety  . Tape Itching    Surgical adhesive  . Zetia [Ezetimibe] Other (See Comments)    Feels funny    Family History :   Family History  Problem Relation Age of Onset  . Diabetes Mother   . Arrhythmia Mother    . Heart disease Father   . Cancer Paternal Grandmother        Breast   Family history reviewed, noted as above, not pertinent to current presentation.   Home Medications:    Prior to Admission medications   Medication Sig Start Date End Date Taking? Authorizing Provider  aspirin EC 81 MG tablet Take 81 mg by mouth daily.   Yes [provider]  brompheniramine-pseudoephedrine-DM 30-2-10 MG/5ML syrup Take 5 mLs by mouth 3 (three) times daily as needed (for cough).  10/30/19  Yes [provider]  Carboxymethylcellulose Sodium (EYE DROPS OP) Apply 1 drop to eye 2 (two) times daily as needed (FOR DRY EYE).   Yes [provider]  esomeprazole (NEXIUM) 40 MG capsule TAKE 1 CAPSULE BY MOUTH EVERY DAY Patient taking differently: Take 40 mg by mouth every morning.  08/28/19  Yes Fayrene Helper, MD  glipiZIDE (GLUCOTROL XL) 10 MG 24 hr tablet Take 1 tablet (10 mg total) by mouth daily with breakfast. 08/23/19  Yes Fayrene Helper, MD  Insulin Glargine (LANTUS)  100 UNIT/ML Solostar Pen Inject 30 Units into the skin at bedtime. 10/26/19  Yes Nida, Marella Chimes, MD  Metoprolol Tartrate 37.5 MG TABS TAKE 1 TABLET BY MOUTH TWICE DAILY Patient taking differently: Take 37.5 mg by mouth 2 (two) times daily.  09/25/19  Yes Fayrene Helper, MD  naproxen (NAPROSYN) 500 MG tablet Take 1 tablet (500 mg total) by mouth 2 (two) times daily with a meal. Patient taking differently: Take 500 mg by mouth 2 (two) times daily as needed for headache.  07/25/18  Yes Fayrene Helper, MD  rosuvastatin (CRESTOR) 20 MG tablet Take 1 tablet (20 mg total) by mouth daily. Patient taking differently: Take 20 mg by mouth every morning.  01/08/19  Yes Fayrene Helper, MD  temazepam (RESTORIL) 15 MG capsule Take 1 capsule (15 mg total) by mouth at bedtime. 08/23/19  Yes Fayrene Helper, MD  traZODone (DESYREL) 100 MG tablet Take 1 tablet (100 mg total) by mouth at bedtime. 01/08/19  Yes  Fayrene Helper, MD  triamterene-hydrochlorothiazide (DYAZIDE) 37.5-25 MG capsule TAKE ONE CAPSULE BY MOUTH EVERY DAY Patient taking differently: Take 1 capsule by mouth every morning.  07/02/19  Yes Fayrene Helper, MD  venlafaxine XR (EFFEXOR-XR) 75 MG 24 hr capsule TAKE 1 CAPSULE(75 MG) BY MOUTH DAILY Patient taking differently: Take 75 mg by mouth daily with breakfast.  10/01/19  Yes Fayrene Helper, MD  Vitamin D, Ergocalciferol, (DRISDOL) 1.25 MG (50000 UT) CAPS capsule Take 1 capsule (50,000 Units total) by mouth every 7 (seven) days. 1 capsule by mouth weekly Patient taking differently: Take 50,000 Units by mouth every Tuesday.  03/22/19  Yes Fayrene Helper, MD  LORazepam (ATIVAN) 1 MG tablet TAKE ONE TABLET BY MOUTH EVERY 8 HOURS AS NEEDED FOR ANXIETY AND/OR NAUSEA Patient taking differently: Take 1 mg by mouth every 8 (eight) hours as needed for anxiety. AND/OR NAUSEA 08/23/19   Fayrene Helper, MD  ONETOUCH VERIO test strip USE TO TEST UP TO 4 TIMES PER DAY Patient not taking: Reported on 11/01/2019 08/27/19   Fayrene Helper, MD    Physical Exam:    Physical Exam: Vitals:   11/01/19 2000 11/01/19 2100 11/01/19 2116 11/01/19 2124  BP: (!) 101/45 (!) 100/51 101/67   Pulse: 63 67 71 64  Resp:      Temp:      TempSrc:      SpO2: 97% 91% 96% 98%  Weight:      Height:        Constitutional: NAD, calm, comfortable Vitals:   11/01/19 2000 11/01/19 2100 11/01/19 2116 11/01/19 2124  BP: (!) 101/45 (!) 100/51 101/67   Pulse: 63 67 71 64  Resp:      Temp:      TempSrc:      SpO2: 97% 91% 96% 98%  Weight:      Height:       Eyes: PERRL, lids and conjunctivae normal ENMT: Mucous membranes are moist. Posterior pharynx clear of any exudate or lesions.Normal dentition.  Neck: normal, supple, no masses, no thyromegaly Respiratory: clear to auscultation bilaterally, no wheezing, no crackles. Normal respiratory effort. No accessory muscle use.   Cardiovascular: Regular rate and rhythm, no murmurs / rubs / gallops. No extremity edema. 2+ pedal pulses. No carotid bruits.  Abdomen: no tenderness, no masses palpated. No hepatosplenomegaly. Bowel sounds positive.  Musculoskeletal: no clubbing / cyanosis. No joint deformity upper and lower extremities. Good ROM, no contractures. Normal muscle tone.  Skin: no rashes, lesions, ulcers. No induration Neurologic: CN 2-12 grossly intact. Sensation intact, DTR normal. Strength 5/5 in all 4.  Psychiatric: Normal judgment and insight. Alert and oriented x 3. Normal mood.    Decubitus Ulcers: Not present on admission Catheters and tubes: None  Data Review:    Labs on Admission: I have personally reviewed following labs and imaging studies  CBC: Recent Labs  Lab 11/01/19 1633  WBC 9.1  NEUTROABS 4.2  HGB 16.4*  HCT 49.3*  MCV 98.6  PLT [id number]   Basic Metabolic Panel: Recent Labs  Lab 11/01/19 1633  NA 139  K 4.1  CL 105  CO2 23  GLUCOSE 178*  BUN 13  CREATININE 0.95  CALCIUM 9.2   GFR: Estimated Creatinine Clearance: 76.5 mL/min (by C-G formula based on SCr of 0.95 mg/dL). Liver Function Tests: Recent Labs  Lab 11/01/19 1633  AST 27  ALT 27  ALKPHOS 86  BILITOT 0.6  PROT 7.4  ALBUMIN 3.8   No results for input(s): LIPASE, AMYLASE in the last 168 hours. No results for input(s): AMMONIA in the last 168 hours. Coagulation Profile: No results for input(s): INR, PROTIME in the last 168 hours. Cardiac Enzymes: No results for input(s): CKTOTAL, CKMB, CKMBINDEX, TROPONINI in the last 168 hours. BNP (last 3 results) No results for input(s): PROBNP in the last 8760 hours. HbA1C: No results for input(s): HGBA1C in the last 72 hours. CBG: No results for input(s): GLUCAP in the last 168 hours. Lipid Profile: No results for input(s): CHOL, HDL, LDLCALC, TRIG, CHOLHDL, LDLDIRECT in the last 72 hours. Thyroid Function Tests: No results for input(s): TSH, T4TOTAL, FREET4,  T3FREE, THYROIDAB in the last 72 hours. Anemia Panel: No results for input(s): VITAMINB12, FOLATE, FERRITIN, TIBC, IRON, RETICCTPCT in the last 72 hours. Urine analysis:    Component Value Date/Time   COLORURINE YELLOW 11/01/2019 2003   APPEARANCEUR CLEAR 11/01/2019 2003   LABSPEC 1.009 11/01/2019 2003   PHURINE 7.0 11/01/2019 2003   GLUCOSEU NEGATIVE 11/01/2019 2003   HGBUR NEGATIVE 11/01/2019 2003   [place] 11/01/2019 2003   BILIRUBINUR neg 01/20/2012 [place] 11/01/2019 2003   PROTEINUR NEGATIVE 11/01/2019 2003   UROBILINOGEN 0.2 12/23/2013 2210   NITRITE NEGATIVE 11/01/2019 2003   LEUKOCYTESUR NEGATIVE 11/01/2019 2003     Imaging Results:      Radiological Exams on Admission: DG Chest Portable 1 View  Result Date: 11/01/2019 CLINICAL DATA:  Shortness of breath, COVID positive EXAM: PORTABLE CHEST 1 VIEW COMPARISON:  Chest radiograph dated 05/25/2017 FINDINGS: The heart size and mediastinal contours are within normal limits. Both lungs are clear. The visualized skeletal structures are unremarkable. Surgical clips overlie the left axilla. IMPRESSION: No active disease. Electronically Signed   By: Zerita Boers M.D.   On: 11/01/2019 17:19      Tymar Polyak Ginette Otto MD [facility]  If 7PM-7AM, please contact night-coverage   11/01/2019, 10:36 PM

## 2019-11-01 NOTE — ED Triage Notes (Signed)
Pt dx with covid on Tuesday, pt c/o body aches, chest and nasal congestion, and weakness.  Noted to have hypotension on Tuesday. In triage, pt's bp 94/31.

## 2019-11-01 NOTE — Discharge Instructions (Signed)
Please take 2 puffs of the albuterol inhaler every 4-6 hours as needed for cough or shortness of breath.  You were also given cough medication.  Please take as directed.  Please follow-up with your regular doctor in the next 3 to 5 days for reevaluation.  You should get a pulse ox to monitor your oxygen at home.  If you notice that your oxygen saturation is less than 93% then you should return to the emergency department.  Return to the emergency department for new or worsening symptoms in the meantime.  You should be isolated for at least 7 days since the onset of your symptoms AND >72 hours after symptoms resolution (absence of fever without the use of fever reducing medicaiton and improvement in respiratory symptoms), whichever is longer

## 2019-11-02 LAB — PROCALCITONIN: Procalcitonin: <0.1 ng/mL

## 2019-11-02 NOTE — ED Notes (Signed)
98% RA

## 2019-11-06 ENCOUNTER — Other Ambulatory Visit: Payer: Self-pay

## 2019-11-06 ENCOUNTER — Ambulatory Visit (INDEPENDENT_AMBULATORY_CARE_PROVIDER_SITE_OTHER): Payer: BC Managed Care – PPO | Admitting: Family Medicine

## 2019-11-06 VITALS — BP 100/63 | Ht 66.0 in | Wt 228.0 lb

## 2019-11-06 DIAGNOSIS — U071 COVID-19: Secondary | ICD-10-CM

## 2019-11-06 DIAGNOSIS — F17218 Nicotine dependence, cigarettes, with other nicotine-induced disorders: Secondary | ICD-10-CM | POA: Diagnosis not present

## 2019-11-06 DIAGNOSIS — E1169 Type 2 diabetes mellitus with other specified complication: Secondary | ICD-10-CM | POA: Diagnosis not present

## 2019-11-06 DIAGNOSIS — R0902 Hypoxemia: Secondary | ICD-10-CM

## 2019-11-06 DIAGNOSIS — Z794 Long term (current) use of insulin: Secondary | ICD-10-CM

## 2019-11-06 NOTE — Patient Instructions (Addendum)
Keep f/U  Appointment in April as before, call if you need me sooner  Please get fasting labs ordered in November ( reprint and mail please) 1 week before next visit  Please work on cutting back on smoking with a view to quitting  Use tylenol and benadryl for headache and sinus symptom.  Hop you coninue to improve, reporting about 30 % recovery now  Thanks for choosing [facility], we consider it a privelige to serve you.

## 2019-11-06 NOTE — Progress Notes (Signed)
Virtual Visit via Telephone Note  I connected with Annette [name] on 11/06/19 at  3:40 PM EST by telephone and verified that I am speaking with the correct person using two identifiers.  Location: Patient: home Provider: office    I discussed the limitations, risks, security and privacy concerns of performing an evaluation and management service by telephone and the availability of in person appointments. I also discussed with the patient that there may be a patient responsible charge related to this service. The patient expressed understanding and agreed to proceed.   History of Present Illness: Tested positive covid on 01/26, spouse was positive on 10/25/2019 Started feeling sick on 01/24, fatigue , with head and chest congestion, [facility] [place], constant headache worse with movement Was also evaluated at [facility] ED on 11/01/2019, and this visit is reviewed after boeing seen at [facility] with a low blood pressure, she had called in for advice regarding this. Nausea and  3 days diarrheah up to yesterday Reports an overall 30 % recovery to date Good year told her to return on the 3rd/  4th, and [facility] had told her to return on Nov 12, 2019. States she was contacted by her job on 11/05/2019 and she was told  that her services were no longer needed. This is an unexpected turn of events, and she is not happy   Observations/Objective: Ht 5\' 6"  (1.676 m)   Wt 228 lb (103.4 kg)   BMI 36.80 kg/m  Good communication with no confusion and intact memory. Alert and oriented x 3 No signs of respiratory distress during speech    Assessment and Plan: COVID-19 virus infection Symptomatic from infection testing positive on 10/30/2019, has both respiratory and gI symptoms. Denies fever.  continue supportive care at home, from ED visit, pt was initially going to be admitted, will need close follow up, discussed warning symptoms of deterioration esp shortness of breath, fever ,  or light headedness, verbalizes understanding  Hypoxia cXR in eD showed n infection, pt has chronic tobacco use  Type 2 diabetes mellitus with other specified complication ([place]) Annette [name] is reminded of the importance of commitment to daily physical activity for 30 minutes or more, as able and the need to limit carbohydrate intake to 30 to 60 grams per meal to help with blood sugar control.   The need to take medication as prescribed, test blood sugar as directed, and to call between visits if there is a concern that blood sugar is uncontrolled is also discussed.   Annette [name] is reminded of the importance of daily foot exam, annual eye examination, and good blood sugar, blood pressure and cholesterol control. Improved and has upcoming appt with Endo  Diabetic Labs Latest Ref Rng & Units 11/01/2019 08/27/2019 07/25/2019 06/04/2019 03/12/2019  HbA1c 4.8 - 5.6 % - - 7.5(H) - 8.3(H)  Microalbumin Not Estab. ug/mL - - - - -  Micro/Creat Ratio 0.0 - 30.0 mg/g creat - - - - -  Chol 100 - 199 mg/dL - - - - -  HDL >39 mg/dL - - - - -  Calc LDL 0 - 99 mg/dL - - - - -  Triglycerides 0 - 149 mg/dL - - - - -  Creatinine 0.44 - 1.00 mg/dL 0.95 0.91 1.13(H) 0.92 1.05(H)   BP/Weight 11/06/2019 11/01/2019 10/23/2019 10/22/2019 09/10/2019 08/23/2019 [id number]  Systolic BP - 123456 [id number] 92 - 123456 [id number]  Diastolic BP - 63 68 64 - 74 60  Wt. (Lbs) 228 228 228 228 229 229 224  BMI 36.8 36.8 36.8 36.8 36.96 36.96 43.75   Foot/eye exam completion dates 08/23/2019 07/15/2014  Foot Form Completion Done Done         Nicotine dependence Asked:confirms currently smokes cigarettes Assess: Unwilling to quit but cutting back Advise: needs to QUIT to reduce risk of cancer, cardio and cerebrovascular disease Assist: counseled for 5 minutes and literature provided Arrange: follow up in 3 months     Follow Up Instructions:    I discussed the assessment and treatment plan with the patient. The patient was provided  an opportunity to ask questions and all were answered. The patient agreed with the plan and demonstrated an understanding of the instructions.   The patient was advised to call back or seek an in-person evaluation if the symptoms worsen or if the condition fails to improve as anticipated.  I provided 21 minutes of non-face-to-face time during this encounter.   [name], MD

## 2019-11-12 ENCOUNTER — Other Ambulatory Visit: Payer: Self-pay | Admitting: *Deleted

## 2019-11-12 MED ORDER — ESOMEPRAZOLE MAGNESIUM 40 MG PO CPDR: DELAYED_RELEASE_CAPSULE | ORAL | 3 refills | Status: DC

## 2019-11-20 ENCOUNTER — Encounter: Payer: Self-pay | Admitting: Family Medicine

## 2019-11-20 ENCOUNTER — Telehealth: Payer: Self-pay | Admitting: Family Medicine

## 2019-11-20 NOTE — Assessment & Plan Note (Signed)
Asked:confirms currently smokes cigarettes Assess: Unwilling to quit but cutting back Advise: needs to QUIT to reduce risk of cancer, cardio and cerebrovascular disease Assist: counseled for 5 minutes and literature provided Arrange: follow up in 3 months  

## 2019-11-20 NOTE — Assessment & Plan Note (Signed)
Symptomatic from infection testing positive on 10/30/2019, has both respiratory and gI symptoms. Denies fever.  continue supportive care at home, from ED visit, pt was initially going to be admitted, will need close follow up, discussed warning symptoms of deterioration esp shortness of breath, fever , or light headedness, verbalizes understanding

## 2019-11-20 NOTE — Telephone Encounter (Signed)
Called patient and left message for them to return call at the office   

## 2019-11-20 NOTE — Assessment & Plan Note (Signed)
Ms. Pamplona is reminded of the importance of commitment to daily physical activity for 30 minutes or more, as able and the need to limit carbohydrate intake to 30 to 60 grams per meal to help with blood sugar control.   The need to take medication as prescribed, test blood sugar as directed, and to call between visits if there is a concern that blood sugar is uncontrolled is also discussed.   Ms. Depietro is reminded of the importance of daily foot exam, annual eye examination, and good blood sugar, blood pressure and cholesterol control. Improved and has upcoming appt with Endo  Diabetic Labs Latest Ref Rng & Units 11/01/2019 08/27/2019 07/25/2019 06/04/2019 03/12/2019  HbA1c 4.8 - 5.6 % - - 7.5(H) - 8.3(H)  Microalbumin Not Estab. ug/mL - - - - -  Micro/Creat Ratio 0.0 - 30.0 mg/g creat - - - - -  Chol 100 - 199 mg/dL - - - - -  HDL >39 mg/dL - - - - -  Calc LDL 0 - 99 mg/dL - - - - -  Triglycerides 0 - 149 mg/dL - - - - -  Creatinine 0.44 - 1.00 mg/dL 0.95 0.91 1.13(H) 0.92 1.05(H)   BP/Weight 11/06/2019 11/01/2019 10/23/2019 10/22/2019 09/10/2019 08/23/2019 [id number]  Systolic BP - 123456 [id number] 92 - 123456 [id number]  Diastolic BP - 63 68 64 - 74 60  Wt. (Lbs) 228 228 228 228 229 229 224  BMI 36.8 36.8 36.8 36.8 36.96 36.96 43.75   Foot/eye exam completion dates 08/23/2019 07/15/2014  Foot Form Completion Done Done

## 2019-11-20 NOTE — Telephone Encounter (Signed)
pls call and let pt know on record review, BP is repeatedly low, I recommend she take HALF maxzide (triamterene) daily instead of one if she is still taking it, and arrange nurse BP check in the next 1 to 2 weeks, with review while pt is in office if abnormal by either Provider, thanks

## 2019-11-20 NOTE — Assessment & Plan Note (Signed)
cXR in eD showed n infection, pt has chronic tobacco use

## 2019-11-26 NOTE — Telephone Encounter (Signed)
Called patient no answer.

## 2019-11-28 ENCOUNTER — Encounter (HOSPITAL_COMMUNITY): Payer: Self-pay

## 2019-11-29 ENCOUNTER — Ambulatory Visit (HOSPITAL_COMMUNITY): Payer: BC Managed Care – PPO

## 2019-12-03 ENCOUNTER — Telehealth: Payer: Self-pay

## 2019-12-03 ENCOUNTER — Other Ambulatory Visit: Payer: Self-pay | Admitting: *Deleted

## 2019-12-03 MED ORDER — TEMAZEPAM 15 MG PO CAPS: 15.0000 mg | ORAL_CAPSULE | Freq: Every day | ORAL | 4 refills | Status: DC

## 2019-12-03 NOTE — Telephone Encounter (Signed)
Please call in Temazepam --to [facility] on [address]

## 2019-12-03 NOTE — Telephone Encounter (Signed)
Called pt she stated she has to take a whole pill instead of half due to her Menieres disease I offered to make her nurse visit appt for bp check she stated she would wait to see what was decided with her medication.

## 2019-12-03 NOTE — Telephone Encounter (Signed)
Called pt she stated she has to take a whole pill instead of half due to her

## 2019-12-04 NOTE — Telephone Encounter (Signed)
Based on blood pressure reported I still recommend the half. Tablet ,please let her know, thanks

## 2019-12-04 NOTE — Telephone Encounter (Signed)
Let pt know to take a half of the pill instead of whole with verbal understanding

## 2019-12-06 ENCOUNTER — Telehealth: Payer: Self-pay

## 2019-12-06 NOTE — Telephone Encounter (Signed)
Pt is calling wanting a CT Scan of her Back , this is needed before she can schedule with Dr Ashok Pall office.  Please

## 2019-12-06 NOTE — Telephone Encounter (Signed)
Tele message in December and films showed that  recent X ray seemed no different than a scan done  in 2015. She has been referred to Ortho as a result, after they eval her , they will best determine imaging needed an arrange, pls see her previous msgs before calling  ?? Please ask!

## 2019-12-07 NOTE — Telephone Encounter (Signed)
Called patient- no answer. Seen ortho Rodell Perna) in Jan and they have already ordered a lumbar MRI and wants to see her back after she has this completed

## 2019-12-31 ENCOUNTER — Telehealth: Payer: Self-pay | Admitting: "Endocrinology

## 2019-12-31 ENCOUNTER — Other Ambulatory Visit: Payer: Self-pay | Admitting: "Endocrinology

## 2019-12-31 MED ORDER — INSULIN GLARGINE 100 UNIT/ML SOLOSTAR PEN: 30.0000 [IU] | PEN_INJECTOR | Freq: Every day | SUBCUTANEOUS | 0 refills | Status: DC

## 2019-12-31 NOTE — Telephone Encounter (Signed)
Annette [name] at [facility] called and asked if we could send in a refill for her Lantus. She is not coming back here, she has found another endocrinology office but can not see them until end of April. Please let me know either way so I can let Edwena Blow know so she can let patient know. Thanks [facility] [address]

## 2019-12-31 NOTE — Telephone Encounter (Signed)
I will send a rx for her.

## 2020-01-01 ENCOUNTER — Other Ambulatory Visit: Payer: Self-pay | Admitting: Family Medicine

## 2020-01-03 ENCOUNTER — Other Ambulatory Visit: Payer: Self-pay | Admitting: Family Medicine

## 2020-01-09 ENCOUNTER — Ambulatory Visit: Payer: BC Managed Care – PPO | Admitting: Cardiovascular Disease

## 2020-01-09 NOTE — Progress Notes (Deleted)
Cardiology Office Note:   Date:  01/09/2020  NAME:  Annette [name]    MRN: [id number] DOB:  05-Oct-1960   PCP:  Fayrene Helper, MD  Cardiologist:  Evalina Field, MD  Electrophysiologist:  None   Referring MD: Fayrene Helper, MD   No chief complaint on file. ***  History of Present Illness:   Annette [name] is a 59 y.o. female with a hx of sinus bradycardia, diabetes, HLD who presents for follow-up of chest pain. She has been evaluated several times for atypical chest pain that occurs 3-4 times per week. Occurs while sitting without trigger. Cardiac CTA was ordered and scheduled for 2/25 but not done.    Problem List 1. Bradycardia  -zio patch with minimum HR 54 -echo normal LVEF 60% 2. Diabetes (A1c 7.5) 3. HLD total cholesterol 115, HDL 37, LDL 59, triglycerides 93 4. Tobacco abuse -40 years   Past Medical History: Past Medical History:  Diagnosis Date  . Breast cancer ([place]) 2007   History of  XRT, onTamoxifen  . Bruises easily   . Depression   . Diabetes mellitus    prediabetic  . GERD (gastroesophageal reflux disease)   . Hypertriglyceridemia   . Hypothyroidism    following chemo amnd radiaition for breast cancer, needed replacement short term  . Hypothyroidism (acquired)    replaced x 1 year  . IBS (irritable bowel syndrome)   . Menieres disease    Controlled with triamterene   . Migraines   . Obesity   . Palpitations   . 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[place])   . Sleep apnea 2010   Problems with CPAP    Past Surgical History: Past Surgical History:  Procedure Laterality Date  . ABDOMINAL HYSTERECTOMY  1998   Benign, fibroids  . 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 APPENDECTOMY  06/12/08  . BREAST RECONSTRUCTION     Left reconstructive  . BREAST SURGERY  2007   Left lumpectomy  . BREAST SURGERY     Mammosite - right side  . CHOLECYSTECTOMY  1985  . ESOPHAGEAL DILATION  12/19/2015   Procedure:  ESOPHAGEAL DILATION;  Surgeon: Rogene Houston, MD;  [facility];  Service: Endoscopy;;  . ESOPHAGOGASTRODUODENOSCOPY N/A 12/19/2015   Procedure: ESOPHAGOGASTRODUODENOSCOPY (EGD);  Surgeon: Rogene Houston, MD;  Location: [facility];  Service: Endoscopy;  Laterality: N/A;  11:40  . 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 THYROIDECTOMY, PARTIAL  11/05/2010   Benign disease    Current Medications: No outpatient medications have been marked as taking for the 01/09/20 encounter (Appointment) with O'Neal, Cassie Freer, MD.     Allergies:    Penicillins, Metformin and related, Statins, Tape, and Zetia [ezetimibe]   Social History: Social History   Socioeconomic History  . Marital status: Married    Spouse name: Not on file  . Number of children: Not on file  . Years of education: Not on file  . Highest education level: Not on file  Occupational History  . Not on file  Tobacco Use  . Smoking status: Current Every Day Smoker    Packs/day: 0.50    Years: 40.00    Pack years: 20.00    Types: Cigarettes  . Smokeless tobacco: Never Used  . Tobacco comment: 1/2 pack a day  Substance and Sexual Activity  . Alcohol use: No    Alcohol/week: 0.0 standard drinks  Comment: Encouraged to quit smoking. She has tried the nicotone gum and patches but did not help  . Drug use: No  . Sexual activity: Yes    Birth control/protection: Surgical  Other Topics Concern  . Not on file  Social History Narrative  . Not on file   Social Determinants of Health   Financial Resource Strain:   . Difficulty of Paying Living Expenses:   Food Insecurity:   . Worried About Charity fundraiser in the Last Year:   . Arboriculturist in the Last Year:   Transportation Needs:   . Film/video editor (Medical):   Marland Kitchen Lack of Transportation (Non-Medical):   Physical Activity:   . Days of Exercise per Week:   . Minutes of Exercise per Session:     Stress:   . Feeling of Stress :   Social Connections:   . Frequency of Communication with Friends and Family:   . Frequency of Social Gatherings with Friends and Family:   . Attends Religious Services:   . Active Member of Clubs or Organizations:   . Attends Archivist Meetings:   Marland Kitchen Marital Status:      Family History: The patient's ***family history includes Arrhythmia in her mother; Cancer in her paternal grandmother; Diabetes in her mother; Heart disease in her father.  ROS:   All other ROS reviewed and negative. Pertinent positives noted in the HPI.     EKGs/Labs/Other Studies Reviewed:   The following studies were personally reviewed by me today:  EKG:  EKG is *** ordered today.  The ekg ordered today demonstrates ***, and was personally reviewed by me.   TTE 06/18/2019 1. The left ventricle has normal systolic function with an ejection fraction of 60-65%. The cavity size was normal. Left ventricular diastolic parameters were normal. No evidence of left ventricular regional wall motion abnormalities. 2. The right ventricle has normal systolic function. The cavity was normal. There is no increase in right ventricular wall thickness. Right ventricular systolic pressure could not be assessed. 3. Trivial pericardial effusion is present. 4. No evidence of mitral valve stenosis. 5. The aortic valve is tricuspid. No stenosis of the aortic valve. 6. The aorta is normal unless otherwise noted. 7. The aortic root, ascending aorta and aortic arch are normal in size and structure.  Zio 07/08/2019 Patient had a min HR of 54 bpm (sinus bradycardia), max HR of 112 bpm (sinus tachycardia), and avg HR of 74 bpm (normal sinus rhythm). Predominant underlying rhythm was Sinus Rhythm. Isolated SVEs were rare (<1.0%), SVE Couplets were rare (<1.0%), and no SVE Triplets were present. Isolated VEs were rare (<1.0%), and no VE Couplets or VE Triplets were present. No atrial fibrillation  or flutter.   There was 1 patient triggered event associated with diary entry.   06/14/2019 5:46 PM: Symptoms of dizziness reported that coincided with normal sinus rhythm @ 86 bpm.   Impression:  1. No significant arrhythmias detected.  2. Symptoms of dizziness do not appear to be related to any arrhythmia.  Recent Labs: 11/01/2019: ALT 27; B Natriuretic Peptide 46.0; BUN 13; Creatinine, Ser 0.95; Hemoglobin 16.4; Platelets 311; Potassium 4.1; Sodium 139   Recent Lipid Panel    Component Value Date/Time   CHOL 115 11/21/2018 0950   TRIG 93 11/21/2018 0950   HDL 37 (L) 11/21/2018 0950   CHOLHDL 3.1 11/21/2018 0950   CHOLHDL 5.0 11/15/2014 1119   VLDL 25 11/15/2014 1119   LDLCALC 59  11/21/2018 0950    Physical Exam:   VS:  There were no vitals taken for this visit.   Wt Readings from Last 3 Encounters:  11/06/19 228 lb (103.4 kg)  11/01/19 228 lb (103.4 kg)  10/23/19 228 lb (103.4 kg)    General: Well nourished, well developed, in no acute distress Heart: Atraumatic, normal size  Eyes: PEERLA, EOMI  Neck: Supple, no JVD Endocrine: No thryomegaly Cardiac: Normal S1, S2; RRR; no murmurs, rubs, or gallops Lungs: Clear to auscultation bilaterally, no wheezing, rhonchi or rales  Abd: Soft, nontender, no hepatomegaly  Ext: No edema, pulses 2+ Musculoskeletal: No deformities, BUE and BLE strength normal and equal Skin: Warm and dry, no rashes   Neuro: Alert and oriented to person, place, time, and situation, CNII-XII grossly intact, no focal deficits  Psych: Normal mood and affect   ASSESSMENT:   BENNETT KAUZLARICH is a 59 y.o. female who presents for the following: No diagnosis found.  PLAN:   There are no diagnoses linked to this encounter.  Disposition: No follow-ups on file.  Medication Adjustments/Labs and Tests Ordered: Current medicines are reviewed at length with the patient today.  Concerns regarding medicines are outlined above.  No orders of the defined  types were placed in this encounter.  No orders of the defined types were placed in this encounter.   There are no Patient Instructions on file for this visit.   Time Spent with Patient: I have spent a total of *** minutes with patient reviewing hospital notes, telemetry, EKGs, labs and examining the patient as well as establishing an assessment and plan that was discussed with the patient.  > 50% of time was spent in direct patient care.  Signed, Addison Naegeli. Audie Box, [place]  [address] [place], [place] [address] [phone]  01/09/2020 6:51 AM

## 2020-01-15 ENCOUNTER — Ambulatory Visit: Payer: BC Managed Care – PPO | Admitting: Family Medicine

## 2020-01-29 ENCOUNTER — Ambulatory Visit: Payer: BC Managed Care – PPO | Admitting: Family Medicine

## 2020-02-05 ENCOUNTER — Other Ambulatory Visit: Payer: Self-pay | Admitting: Neurosurgery

## 2020-02-05 DIAGNOSIS — M545 Low back pain, unspecified: Secondary | ICD-10-CM

## 2020-02-20 ENCOUNTER — Other Ambulatory Visit: Payer: Self-pay | Admitting: Family Medicine

## 2020-02-20 ENCOUNTER — Ambulatory Visit: Payer: BC Managed Care – PPO | Admitting: Family Medicine

## 2020-02-21 LAB — COMPREHENSIVE METABOLIC PANEL
ALT: 22 IU/L (ref 0–32)
AST: 26 IU/L (ref 0–40)
Albumin/Globulin Ratio: 1.6 (ref 1.2–2.2)
Albumin: 4.4 g/dL (ref 3.8–4.9)
Alkaline Phosphatase: 95 IU/L (ref 48–121)
BUN/Creatinine Ratio: 14 (ref 12–28)
BUN: 15 mg/dL (ref 8–27)
Bilirubin Total: 0.5 mg/dL (ref 0.0–1.2)
CO2: 25 mmol/L (ref 20–29)
Calcium: 9.9 mg/dL (ref 8.7–10.3)
Chloride: 100 mmol/L (ref 96–106)
Creatinine, Ser: 1.07 mg/dL — ABNORMAL HIGH (ref 0.57–1.00)
GFR calc Af Amer: 65 mL/min/{1.73_m2} (ref >59)
GFR calc non Af Amer: 57 mL/min/{1.73_m2} — ABNORMAL LOW (ref >59)
Globulin, Total: 2.7 g/dL (ref 1.5–4.5)
Glucose: 183 mg/dL — ABNORMAL HIGH (ref 65–99)
Potassium: 4.4 mmol/L (ref 3.5–5.2)
Sodium: 142 mmol/L (ref 134–144)
Total Protein: 7.1 g/dL (ref 6.0–8.5)

## 2020-02-21 LAB — CBC/DIFF AMBIGUOUS DEFAULT
Basophils Absolute: 0.1 10*3/uL (ref 0.0–0.2)
Basos: 1 %
EOS (ABSOLUTE): 0.3 10*3/uL (ref 0.0–0.4)
Eos: 3 %
Hematocrit: 51.8 % — ABNORMAL HIGH (ref 34.0–46.6)
Hemoglobin: 17.6 g/dL — ABNORMAL HIGH (ref 11.1–15.9)
Immature Grans (Abs): 0 10*3/uL (ref 0.0–0.1)
Immature Granulocytes: 0 %
Lymphocytes Absolute: 4.9 10*3/uL — ABNORMAL HIGH (ref 0.7–3.1)
Lymphs: 40 %
MCH: 33.6 pg — ABNORMAL HIGH (ref 26.6–33.0)
MCHC: 34 g/dL (ref 31.5–35.7)
MCV: 99 fL — ABNORMAL HIGH (ref 79–97)
Monocytes Absolute: 1 10*3/uL — ABNORMAL HIGH (ref 0.1–0.9)
Monocytes: 8 %
NRBC: 1 % — ABNORMAL HIGH (ref 0–0)
Neutrophils Absolute: 6 10*3/uL (ref 1.4–7.0)
Neutrophils: 48 %
Platelets: 363 10*3/uL (ref 150–450)
RBC: 5.24 x10E6/uL (ref 3.77–5.28)
RDW: 12.9 % (ref 11.7–15.4)
WBC: 12.3 10*3/uL — ABNORMAL HIGH (ref 3.4–10.8)

## 2020-02-21 LAB — LIPID PANEL W/O CHOL/HDL RATIO
Cholesterol, Total: 137 mg/dL (ref 100–199)
HDL: 41 mg/dL (ref >39)
LDL Chol Calc (NIH): 75 mg/dL (ref 0–99)
Triglycerides: 114 mg/dL (ref 0–149)
VLDL Cholesterol Cal: 21 mg/dL (ref 5–40)

## 2020-02-21 LAB — SPECIMEN STATUS REPORT

## 2020-02-21 LAB — VITAMIN D 25 HYDROXY (VIT D DEFICIENCY, FRACTURES): Vit D, 25-Hydroxy: 59.7 ng/mL (ref 30.0–100.0)

## 2020-02-21 LAB — TSH: TSH: 2.23 u[IU]/mL (ref 0.450–4.500)

## 2020-02-26 ENCOUNTER — Encounter: Payer: Self-pay | Admitting: Family Medicine

## 2020-02-26 NOTE — Telephone Encounter (Signed)
Please change pt appt time she would like to come in offce 4:20 is observed for phone visits

## 2020-02-29 ENCOUNTER — Telehealth: Payer: Self-pay

## 2020-02-29 DIAGNOSIS — D72829 Elevated white blood cell count, unspecified: Secondary | ICD-10-CM

## 2020-02-29 NOTE — Telephone Encounter (Signed)
-----   Message from Perlie Mayo, NP sent at 02/26/2020 11:30 AM EDT ----- Regarding: Can we go ahead and place this referral? Thank you :)  ----- Message ----- From: Fayrene Helper, MD Sent: 02/21/2020   9:00 AM EDT To: Perlie Mayo, NP  I recommend hematology eval for leukocytosis with elevated lymphocytes and monocytes, also has elevated Hb

## 2020-03-02 ENCOUNTER — Other Ambulatory Visit: Payer: Self-pay | Admitting: Family Medicine

## 2020-03-04 ENCOUNTER — Encounter: Payer: Self-pay | Admitting: Family Medicine

## 2020-03-06 ENCOUNTER — Other Ambulatory Visit: Payer: BC Managed Care – PPO

## 2020-03-11 ENCOUNTER — Telehealth: Payer: Self-pay | Admitting: Family Medicine

## 2020-03-11 ENCOUNTER — Ambulatory Visit: Payer: BC Managed Care – PPO | Admitting: Family Medicine

## 2020-03-11 ENCOUNTER — Encounter: Payer: Self-pay | Admitting: Family Medicine

## 2020-03-11 ENCOUNTER — Other Ambulatory Visit: Payer: Self-pay

## 2020-03-11 VITALS — BP 108/70 | HR 76 | Temp 97.3°F | Ht 66.0 in | Wt 221.0 lb

## 2020-03-11 DIAGNOSIS — E785 Hyperlipidemia, unspecified: Secondary | ICD-10-CM | POA: Diagnosis not present

## 2020-03-11 DIAGNOSIS — Z794 Long term (current) use of insulin: Secondary | ICD-10-CM | POA: Diagnosis not present

## 2020-03-11 DIAGNOSIS — Z1211 Encounter for screening for malignant neoplasm of colon: Secondary | ICD-10-CM

## 2020-03-11 DIAGNOSIS — Z79899 Other long term (current) drug therapy: Secondary | ICD-10-CM | POA: Diagnosis not present

## 2020-03-11 DIAGNOSIS — G4730 Sleep apnea, unspecified: Secondary | ICD-10-CM

## 2020-03-11 DIAGNOSIS — F17218 Nicotine dependence, cigarettes, with other nicotine-induced disorders: Secondary | ICD-10-CM

## 2020-03-11 DIAGNOSIS — E1169 Type 2 diabetes mellitus with other specified complication: Secondary | ICD-10-CM | POA: Diagnosis not present

## 2020-03-11 DIAGNOSIS — F5101 Primary insomnia: Secondary | ICD-10-CM

## 2020-03-11 LAB — POCT GLYCOSYLATED HEMOGLOBIN (HGB A1C): Hemoglobin A1C: 9.4 % — AB (ref 4.0–5.6)

## 2020-03-11 MED ORDER — LORAZEPAM 1 MG PO TABS: ORAL_TABLET | ORAL | 0 refills | Status: DC

## 2020-03-11 MED ORDER — VENLAFAXINE HCL ER 75 MG PO CP24: ORAL_CAPSULE | ORAL | 1 refills | Status: DC

## 2020-03-11 MED ORDER — NAPROXEN 500 MG PO TABS: 500.0000 mg | ORAL_TABLET | Freq: Two times a day (BID) | ORAL | 0 refills | Status: DC

## 2020-03-11 MED ORDER — TRIAMTERENE-HCTZ 37.5-25 MG PO CAPS: 1.0000 | ORAL_CAPSULE | Freq: Every day | ORAL | 1 refills | Status: DC

## 2020-03-11 MED ORDER — GLIPIZIDE ER 10 MG PO TB24: ORAL_TABLET | ORAL | 5 refills | Status: DC

## 2020-03-11 NOTE — Telephone Encounter (Signed)
I called back around 5:15 and had to leave a message at the pharmacy authorizing the  prescription, this was [facility]

## 2020-03-11 NOTE — Telephone Encounter (Signed)
PT is at [place] wanting someone to call her back about her prescription for her cpap machine tubing.

## 2020-03-11 NOTE — Assessment & Plan Note (Addendum)
Needs to have annual screening Asked:confirms currently smokes cigarettes, 1PPD Assess: Unwilling to quit but cutting back Advise: needs to QUIT to reduce risk of cancer, cardio and cerebrovascular disease Assist: counseled for 5 minutes and literature provided Arrange: follow up in 3 months

## 2020-03-11 NOTE — Patient Instructions (Addendum)
F/U in offic e in 3 months, call if you need me sooner  Please schedule lung scan for patient and give her appt info  Please work on cutting back on cigarette smoking to improve breahing and health  You will be referred to a new Endocrinologist Increase glipizide 10 mg to tWO tablets every morning, stay on  Same dose of insulin, send in fasting and bed time sugar via my chart , every 1 week for next 4 weeks  Urine test today (UDS)    you are being checked to see if oxygen falls too low  You are being referred for colonoscopy , hopefully in the next 4 to 6 months you will be able to have his done  Work on each problem , one at a time, then after a while the list will shorten and you will get through them!  It is important that you exercise regularly at least 30 minutes 5 times a week. If you develop chest pain, have severe difficulty breathing, or feel very tired, stop exercising immediately and seek medical attention   Think about what you will eat, plan ahead. Choose " clean, green, fresh or frozen" over canned, processed or packaged foods which are more sugary, salty and fatty. 70 to 75% of food eaten should be vegetables and fruit. Three meals at set times with snacks allowed between meals, but they must be fruit or vegetables. Aim to eat over a 12 hour period , example 7 am to 7 pm, and STOP after  your last meal of the day. Drink water,generally about 64 ounces per day, no other drink is as healthy. Fruit juice is best enjoyed in a healthy way, by EATING the fruit.

## 2020-03-12 ENCOUNTER — Other Ambulatory Visit (HOSPITAL_COMMUNITY)
Admission: RE | Admit: 2020-03-12 | Discharge: 2020-03-12 | Disposition: A | Discharge status: Home / Self Care | Payer: BC Managed Care – PPO | Source: Other Acute Inpatient Hospital | Attending: Family Medicine | Admitting: Family Medicine

## 2020-03-12 ENCOUNTER — Encounter (HOSPITAL_COMMUNITY): Payer: Self-pay | Admitting: Hematology

## 2020-03-12 ENCOUNTER — Encounter (HOSPITAL_COMMUNITY): Payer: Self-pay | Admitting: *Deleted

## 2020-03-12 ENCOUNTER — Other Ambulatory Visit: Payer: Self-pay

## 2020-03-12 ENCOUNTER — Inpatient Hospital Stay (HOSPITAL_COMMUNITY): Payer: BC Managed Care – PPO

## 2020-03-12 ENCOUNTER — Inpatient Hospital Stay (HOSPITAL_COMMUNITY): Payer: BC Managed Care – PPO | Attending: Hematology | Admitting: Hematology

## 2020-03-12 ENCOUNTER — Telehealth: Payer: BC Managed Care – PPO | Admitting: Family Medicine

## 2020-03-12 VITALS — BP 106/55 | HR 73 | Temp 98.7°F | Resp 18 | Ht 65.5 in | Wt 221.8 lb

## 2020-03-12 DIAGNOSIS — Z9223 Personal history of estrogen therapy: Secondary | ICD-10-CM | POA: Diagnosis not present

## 2020-03-12 DIAGNOSIS — G4730 Sleep apnea, unspecified: Secondary | ICD-10-CM

## 2020-03-12 DIAGNOSIS — E896 Postprocedural adrenocortical (-medullary) hypofunction: Secondary | ICD-10-CM | POA: Insufficient documentation

## 2020-03-12 DIAGNOSIS — D72829 Elevated white blood cell count, unspecified: Secondary | ICD-10-CM

## 2020-03-12 DIAGNOSIS — Z9221 Personal history of antineoplastic chemotherapy: Secondary | ICD-10-CM | POA: Insufficient documentation

## 2020-03-12 DIAGNOSIS — Z923 Personal history of irradiation: Secondary | ICD-10-CM | POA: Insufficient documentation

## 2020-03-12 DIAGNOSIS — Z170 Estrogen receptor positive status [ER+]: Secondary | ICD-10-CM | POA: Diagnosis not present

## 2020-03-12 DIAGNOSIS — Z803 Family history of malignant neoplasm of breast: Secondary | ICD-10-CM | POA: Insufficient documentation

## 2020-03-12 DIAGNOSIS — Z9081 Acquired absence of spleen: Secondary | ICD-10-CM | POA: Insufficient documentation

## 2020-03-12 DIAGNOSIS — D751 Secondary polycythemia: Secondary | ICD-10-CM | POA: Insufficient documentation

## 2020-03-12 DIAGNOSIS — Z853 Personal history of malignant neoplasm of breast: Secondary | ICD-10-CM | POA: Diagnosis not present

## 2020-03-12 DIAGNOSIS — F17210 Nicotine dependence, cigarettes, uncomplicated: Secondary | ICD-10-CM | POA: Insufficient documentation

## 2020-03-12 DIAGNOSIS — Z79899 Other long term (current) drug therapy: Secondary | ICD-10-CM | POA: Diagnosis present

## 2020-03-12 LAB — CBC WITH DIFFERENTIAL/PLATELET
Abs Immature Granulocytes: 0.02 10*3/uL (ref 0.00–0.07)
Basophils Absolute: 0.1 10*3/uL (ref 0.0–0.1)
Basophils Relative: 1 %
Eosinophils Absolute: 0.3 10*3/uL (ref 0.0–0.5)
Eosinophils Relative: 3 %
HCT: 49.3 % — ABNORMAL HIGH (ref 36.0–46.0)
Hemoglobin: 16.3 g/dL — ABNORMAL HIGH (ref 12.0–15.0)
Immature Granulocytes: 0 %
Lymphocytes Relative: 44 %
Lymphs Abs: 4.7 10*3/uL — ABNORMAL HIGH (ref 0.7–4.0)
MCH: 33.1 pg (ref 26.0–34.0)
MCHC: 33.1 g/dL (ref 30.0–36.0)
MCV: 100 fL (ref 80.0–100.0)
Monocytes Absolute: 0.9 10*3/uL (ref 0.1–1.0)
Monocytes Relative: 9 %
Neutro Abs: 4.5 10*3/uL (ref 1.7–7.7)
Neutrophils Relative %: 43 %
Platelets: 316 10*3/uL (ref 150–400)
RBC: 4.93 MIL/uL (ref 3.87–5.11)
RDW: 14 % (ref 11.5–15.5)
WBC: 10.5 10*3/uL (ref 4.0–10.5)
nRBC: 0.2 % (ref 0.0–0.2)

## 2020-03-12 LAB — VITAMIN D 25 HYDROXY (VIT D DEFICIENCY, FRACTURES): Vit D, 25-Hydroxy: 102.77 ng/mL — ABNORMAL HIGH (ref 30–100)

## 2020-03-12 LAB — LACTATE DEHYDROGENASE: LDH: 157 U/L (ref 98–192)

## 2020-03-12 LAB — COMPREHENSIVE METABOLIC PANEL
ALT: 21 U/L (ref 0–44)
AST: 18 U/L (ref 15–41)
Albumin: 3.8 g/dL (ref 3.5–5.0)
Alkaline Phosphatase: 77 U/L (ref 38–126)
Anion gap: 9 (ref 5–15)
BUN: 12 mg/dL (ref 6–20)
CO2: 27 mmol/L (ref 22–32)
Calcium: 9.8 mg/dL (ref 8.9–10.3)
Chloride: 103 mmol/L (ref 98–111)
Creatinine, Ser: 0.87 mg/dL (ref 0.44–1.00)
GFR calc Af Amer: >60 mL/min (ref >60)
GFR calc non Af Amer: >60 mL/min (ref >60)
Glucose, Bld: 195 mg/dL — ABNORMAL HIGH (ref 70–99)
Potassium: 4.2 mmol/L (ref 3.5–5.1)
Sodium: 139 mmol/L (ref 135–145)
Total Bilirubin: 0.7 mg/dL (ref 0.3–1.2)
Total Protein: 7.2 g/dL (ref 6.5–8.1)

## 2020-03-12 LAB — SAVE SMEAR(SSMR), FOR PROVIDER SLIDE REVIEW

## 2020-03-12 LAB — FOLATE: Folate: 7.6 ng/mL (ref >5.9)

## 2020-03-12 LAB — VITAMIN B12: Vitamin B-12: 251 pg/mL (ref 180–914)

## 2020-03-12 NOTE — Progress Notes (Signed)
CONSULT NOTE  Patient Care Team: Fayrene Helper, MD as PCP - General O'Neal, Cassie Freer, MD as PCP - Cardiology (Cardiology)  CHIEF COMPLAINTS/PURPOSE OF CONSULTATION: Leukocytosis  HISTORY OF PRESENTING ILLNESS:  Annette [name] 59 y.o. female was sent here by her PCP [name] for leukocytosis. Patient has a documented leukocytosis since 2018. Patient reports she a left adrenalectomy and splenectomy on 02/16/2012 for adrenal adenoma. Her spleen was nicked and had to be removed at that time. She denies any chronic infections and is up-to-date with her immunizations. Patient denies any recent steroid or antibiotic use. Patient denies any history of rheumatoid arthritis or lupus. Patient denies any B symptoms including night sweats, fevers, chills or unexplained weight loss. Patient does report smoking since the age of 39. She smokes 1 to 1-1/2 packs/day. The only time she stopped was while she was pregnant. Patient denies any alcohol or illicit drug use. Patient does have a history of breast cancer in 2007 where she received her treatment here at [facility]. She underwent chemo radiation and 5 years of tamoxifen with a lumpectomy. She has not noticed any recent bleeding such as epistasis, hematuria or hematochezia. She denies any recent chest pain on exertion, shortness of breath on minimal exertion, presyncopal episodes or palpitations. Patient has a family history positive for a paternal grandmother with breast cancer. Patient lives at home and performs all her own ADLs.   MEDICAL HISTORY:  Past Medical History:  Diagnosis Date  . Breast cancer ([place]) 2007   History of  XRT, onTamoxifen  . Bruises easily   . Depression   . Diabetes mellitus    prediabetic  . GERD (gastroesophageal reflux disease)   . Hypertriglyceridemia   . Hypothyroidism    following chemo amnd radiaition for breast cancer, needed replacement short term  . Hypothyroidism (acquired)     replaced x 1 year  . IBS (irritable bowel syndrome)   . Menieres disease    Controlled with triamterene   . Migraines   . Obesity   . Palpitations   . 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[place])   . Sleep apnea 2010   Problems with CPAP    SURGICAL HISTORY: Past Surgical History:  Procedure Laterality Date  . ABDOMINAL HYSTERECTOMY  1998   Benign, fibroids  . 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 APPENDECTOMY  06/12/08  . BREAST RECONSTRUCTION     Left reconstructive  . BREAST SURGERY  2007   Left lumpectomy  . BREAST SURGERY     Mammosite - right side  . CHOLECYSTECTOMY  1985  . ESOPHAGEAL DILATION  12/19/2015   Procedure: ESOPHAGEAL DILATION;  Surgeon: Rogene Houston, MD;  [facility];  Service: Endoscopy;;  . ESOPHAGOGASTRODUODENOSCOPY N/A 12/19/2015   Procedure: ESOPHAGOGASTRODUODENOSCOPY (EGD);  Surgeon: Rogene Houston, MD;  Location: [facility];  Service: Endoscopy;  Laterality: N/A;  11:40  . SPLENECTOMY, TOTAL  12/31/760   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  . THYROIDECTOMY, PARTIAL  11/05/2010   Benign disease    SOCIAL HISTORY: Social History   Socioeconomic History  . Marital status: Married    Spouse name: Alroy Dust  . Number of children: 2  . Years of education: Not on file  . Highest education level: Not on file  Occupational History  . Not on file  Tobacco Use  . Smoking status: Current Every Day Smoker    Packs/day: 0.50    Years:  40.00    Pack years: 20.00    Types: Cigarettes  . Smokeless tobacco: Never Used  . Tobacco comment: 1/2 pack a day  Substance and Sexual Activity  . Alcohol use: No    Alcohol/week: 0.0 standard drinks    Comment: Encouraged to quit smoking. She has tried the nicotone gum and patches but did not help  . Drug use: No  . Sexual activity: Yes    Birth control/protection: Surgical  Other Topics Concern  . Not on file  Social History Narrative  . Not on  file   Social Determinants of Health   Financial Resource Strain:   . Difficulty of Paying Living Expenses:   Food Insecurity:   . Worried About Charity fundraiser in the Last Year:   . Arboriculturist in the Last Year:   Transportation Needs:   . Film/video editor (Medical):   Marland Kitchen Lack of Transportation (Non-Medical):   Physical Activity:   . Days of Exercise per Week:   . Minutes of Exercise per Session:   Stress:   . Feeling of Stress :   Social Connections:   . Frequency of Communication with Friends and Family:   . Frequency of Social Gatherings with Friends and Family:   . Attends Religious Services:   . Active Member of Clubs or Organizations:   . Attends Archivist Meetings:   Marland Kitchen Marital Status:   Intimate Partner Violence:   . Fear of Current or Ex-Partner:   . Emotionally Abused:   Marland Kitchen Physically Abused:   . Sexually Abused:     FAMILY HISTORY: Family History  Problem Relation Age of Onset  . Diabetes Mother   . Arrhythmia Mother   . Heart disease Father   . Cancer Paternal Grandmother        Breast  . Diabetes Brother   . Heart disease Brother     ALLERGIES:  is allergic to penicillins; metformin and related; statins; tape; and zetia [ezetimibe].  MEDICATIONS:  Current Outpatient Medications  Medication Sig Dispense Refill  . aspirin EC 81 MG tablet Take 81 mg by mouth daily.    Marland Kitchen esomeprazole (NEXIUM) 40 MG capsule TAKE 1 CAPSULE BY MOUTH EVERY DAY 90 capsule 3  . glipiZIDE (GLUCOTROL XL) 10 MG 24 hr tablet Take two tablets by mouth every morning with breakfast 60 tablet 5  . insulin glargine (LANTUS) 100 UNIT/ML Solostar Pen Inject 30 Units into the skin at bedtime. 5 pen 0  . JARDIANCE 25 MG TABS tablet Take 25 mg by mouth daily.    . Metoprolol Tartrate 37.5 MG TABS TAKE 1 TABLET BY MOUTH TWICE DAILY (Patient taking differently: Take 37.5 mg by mouth 2 (two) times daily. ) 180 tablet 3  . naproxen (NAPROSYN) 500 MG tablet Take 1 tablet  (500 mg total) by mouth 2 (two) times daily with a meal. 40 tablet 0  . ONETOUCH VERIO test strip USE TO TEST UP TO 4 TIMES PER DAY 150 strip 5  . rosuvastatin (CRESTOR) 20 MG tablet TAKE 1 TABLET(20 MG) BY MOUTH DAILY 90 tablet 3  . temazepam (RESTORIL) 15 MG capsule Take 1 capsule (15 mg total) by mouth at bedtime. 30 capsule 4  . traZODone (DESYREL) 100 MG tablet TAKE 1 TABLET(100 MG) BY MOUTH AT BEDTIME 90 tablet 3  . triamterene-hydrochlorothiazide (DYAZIDE) 37.5-25 MG capsule Take 1 each (1 capsule total) by mouth daily. 90 capsule 1  . venlafaxine XR (EFFEXOR-XR) 75 MG  24 hr capsule TAKE 1 CAPSULE(75 MG) BY MOUTH DAILY 90 capsule 1  . Vitamin D, Ergocalciferol, (DRISDOL) 1.25 MG (50000 UT) CAPS capsule Take 1 capsule (50,000 Units total) by mouth every 7 (seven) days. 1 capsule by mouth weekly (Patient taking differently: Take 50,000 Units by mouth every Tuesday. ) 12 capsule 3  . Carboxymethylcellulose Sodium (EYE DROPS OP) Apply 1 drop to eye 2 (two) times daily as needed (FOR DRY EYE).    . LORazepam (ATIVAN) 1 MG tablet TAKE ONE TABLET BY MOUTH EVERY 8 HOURS AS NEEDED FOR ANXIETY AND/OR NAUSEA (Patient not taking: Reported on 03/12/2020) 30 tablet 5  . LORazepam (ATIVAN) 1 MG tablet Take one tablet by mouth once daily, as needed, for anxiety and / or nausea (Patient not taking: Reported on 03/12/2020) 30 tablet 0   No current facility-administered medications for this visit.    REVIEW OF SYSTEMS:   Constitutional: Denies fevers, chills or abnormal night sweats Respiratory: Denies cough, dyspnea or wheezes + shortness of breath Cardiovascular: Denies palpitation, chest discomfort, +lower extremity swelling Gastrointestinal:  Denies heartburn or change in bowel habits, +nausea and constipation Skin: Denies abnormal skin rashes Lymphatics: Denies new lymphadenopathy or easy bruising Neurological:Denies numbness, tingling or new weaknesses Behavioral/Psych: Mood is stable, no new changes   All other systems were reviewed with the patient and are negative.  PHYSICAL EXAMINATION: ECOG PERFORMANCE STATUS: 1 - Symptomatic but completely ambulatory  Vitals:   03/12/20 1320  BP: (!) 106/55  Pulse: 73  Resp: 18  Temp: 98.7 F (37.1 C)  SpO2: 95%   Filed Weights   03/12/20 1320  Weight: 221 lb 12.8 oz (100.6 kg)    GENERAL:alert, no distress and comfortable SKIN: skin color, texture, turgor are normal, no rashes or significant lesions NECK: supple, thyroid normal size, non-tender, without nodularity LYMPH:  no palpable lymphadenopathy in the cervical, axillary or inguinal LUNGS: clear to auscultation and percussion with normal breathing effort HEART: regular rate & rhythm and no murmurs and +lower extremity edema ABDOMEN:abdomen soft, non-tender and normal bowel sounds Musculoskeletal:no cyanosis of digits and no clubbing  PSYCH: alert & oriented x 3 with fluent speech NEURO: no focal motor/sensory deficits  LABORATORY DATA:  I have reviewed the data as listed Recent Results (from the past 2160 hour(s))  CBC/Diff Ambiguous Default     Status: Abnormal   Collection Time: 02/20/20 11:57 AM  Result Value Ref Range   WBC 12.3 (H) 3.4 - 10.8 x10E3/uL   RBC 5.24 3.77 - 5.28 x10E6/uL   Hemoglobin 17.6 (H) 11.1 - 15.9 g/dL   Hematocrit 51.8 (H) 34.0 - 46.6 %   MCV 99 (H) 79 - 97 fL   MCH 33.6 (H) 26.6 - 33.0 pg   MCHC 34.0 31.5 - 35.7 g/dL   RDW 12.9 11.7 - 15.4 %   Platelets 363 150 - 450 x10E3/uL   Neutrophils 48 Not Estab. %   Lymphs 40 Not Estab. %   Monocytes 8 Not Estab. %   Eos 3 Not Estab. %   Basos 1 Not Estab. %   Neutrophils Absolute 6.0 1.4 - 7.0 x10E3/uL   Lymphocytes Absolute 4.9 (H) 0.7 - 3.1 x10E3/uL   Monocytes Absolute 1.0 (H) 0.1 - 0.9 x10E3/uL   EOS (ABSOLUTE) 0.3 0.0 - 0.4 x10E3/uL   Basophils Absolute 0.1 0.0 - 0.2 x10E3/uL   Immature Granulocytes 0 Not Estab. %   Immature Grans (Abs) 0.0 0.0 - 0.1 x10E3/uL   NRBC 1 (H) 0 -  0 %     Comment: A hand-written panel/profile was received from your office. In accordance with the [facility] Ambiguous Test Code Policy dated July 8938, we have assigned CBC with Differential/Platelet, Test Code #005009 to this request. If this is not the testing you wished to receive on this specimen, please contact the [facility] Department to clarify the test order. We appreciate your business.   Comprehensive metabolic panel     Status: Abnormal   Collection Time: 02/20/20 11:57 AM  Result Value Ref Range   Glucose 183 (H) 65 - 99 mg/dL   BUN 15 8 - 27 mg/dL   Creatinine, Ser 1.07 (H) 0.57 - 1.00 mg/dL   GFR calc non Af Amer 57 (L) >59 mL/min/1.73   GFR calc Af Amer 65 >59 mL/min/1.73    Comment: **[facility] currently reports eGFR in compliance with the current**   recommendations of the [employer]. [facility] will   update reporting as new guidelines are published from the NKF-ASN   Task force.    BUN/Creatinine Ratio 14 12 - 28   Sodium 142 134 - 144 mmol/L   Potassium 4.4 3.5 - 5.2 mmol/L   Chloride 100 96 - 106 mmol/L   CO2 25 20 - 29 mmol/L   Calcium 9.9 8.7 - 10.3 mg/dL   Total Protein 7.1 6.0 - 8.5 g/dL   Albumin 4.4 3.8 - 4.9 g/dL   Globulin, Total 2.7 1.5 - 4.5 g/dL   Albumin/Globulin Ratio 1.6 1.2 - 2.2   Bilirubin Total 0.5 0.0 - 1.2 mg/dL   Alkaline Phosphatase 95 48 - 121 IU/L    Comment:               **Please note reference interval change**   AST 26 0 - 40 IU/L   ALT 22 0 - 32 IU/L  Lipid Panel w/o Chol/HDL Ratio     Status: None   Collection Time: 02/20/20 11:57 AM  Result Value Ref Range   Cholesterol, Total 137 100 - 199 mg/dL   Triglycerides 114 0 - 149 mg/dL   HDL 41 >39 mg/dL   VLDL Cholesterol Cal 21 5 - 40 mg/dL   LDL Chol Calc (NIH) 75 0 - 99 mg/dL  TSH     Status: None   Collection Time: 02/20/20 11:57 AM  Result Value Ref Range   TSH 2.230 0.450 - 4.500 uIU/mL  VITAMIN D 25 Hydroxy (Vit-D Deficiency,  Fractures)     Status: None   Collection Time: 02/20/20 11:57 AM  Result Value Ref Range   Vit D, 25-Hydroxy 59.7 30.0 - 100.0 ng/mL    Comment: Vitamin D deficiency has been defined by the Institute of Medicine and an Endocrine Society practice guideline as a level of serum 25-OH vitamin D less than 20 ng/mL (1,2). The Endocrine Society went on to further define vitamin D insufficiency as a level between 21 and 29 ng/mL (2). 1. IOM (Institute of Medicine). 2010. Dietary reference    intakes for calcium and D. [place]: The    [employer]. 2. Holick MF, Binkley , Bischoff-Ferrari HA, et al.    Evaluation, treatment, and prevention of vitamin D    deficiency: an Endocrine Society clinical practice    guideline. JCEM. 2011 Jul; 96(7):1911-30.   Specimen status report     Status: None   Collection Time: 02/20/20 11:57 AM  Result Value Ref Range   specimen status report Comment  Comment: Isac Caddy CMP14 Default Isac Caddy CMP14 Default A hand-written panel/profile was received from your office. In accordance with the [facility] Ambiguous Test Code Policy dated July 9147, we have completed your order by using the closest currently or formerly recognized AMA panel.  We have assigned Comprehensive Metabolic Panel (14), Test Code #[id number] to this request.  If this is not the testing you wished to receive on this specimen, please contact the [place] Client Inquiry/Technical Services Department to clarify the test order.  We appreciate your business. Ambig Abbrev LP Default Ambig Abbrev LP Default A hand-written panel/profile was received from your office. In accordance with the [facility] Ambiguous Test Code Policy dated July 8295, we have completed your order by using the closest currently or formerly recognized AMA panel.  We have assigned Lipid Panel, Test Code [phone] to this request. If this is not the testing you wished to receive on this specimen, plea se  contact the [place] Client Inquiry/Technical Services Department to clarify the test order.  We appreciate your business.   POCT HgB A1C     Status: Abnormal   Collection Time: 03/11/20  4:29 PM  Result Value Ref Range   Hemoglobin A1C 9.4 (A) 4.0 - 5.6 %   HbA1c POC (<> result, manual entry)     HbA1c, POC (prediabetic range)     HbA1c, POC (controlled diabetic range)    Folate     Status: None   Collection Time: 03/12/20  2:56 PM  Result Value Ref Range   Folate 7.6 >5.9 ng/mL    Comment: Performed at [facility], [address], [place], [place] [address]  VITAMIN D 25 Hydroxy (Vit-D Deficiency, Fractures)     Status: Abnormal   Collection Time: 03/12/20  2:56 PM  Result Value Ref Range   Vit D, 25-Hydroxy 102.77 (H) 30 - 100 ng/mL    Comment: (NOTE) Vitamin D deficiency has been defined by the [place] practice guideline as a level of serum 25-OH  vitamin D less than 20 ng/mL (1,2). The Endocrine Society went on to  further define vitamin D insufficiency as a level between 21 and 29  ng/mL (2). 1. IOM (Institute of Medicine). 2010. Dietary reference intakes for  calcium and D. [place]: The [employer]. 2. Holick MF, Binkley [place], Bischoff-Ferrari HA, et al. Evaluation,  treatment, and prevention of vitamin D deficiency: an Endocrine  Society clinical practice guideline, JCEM. 2011 Jul; 96(7): 1911-30. Performed at [facility], [place] [address] [place], [place] [address]   Vitamin B12     Status: None   Collection Time: 03/12/20  2:56 PM  Result Value Ref Range   Vitamin B-12 251 180 - 914 pg/mL    Comment: (NOTE) This assay is not validated for testing neonatal or myeloproliferative syndrome specimens for Vitamin B12 levels. Performed at [facility], [address] [place], [place] [address]   Comprehensive metabolic panel     Status: Abnormal   Collection Time: 03/12/20  2:56 PM  Result Value Ref Range    Sodium 139 135 - 145 mmol/L   Potassium 4.2 3.5 - 5.1 mmol/L   Chloride 103 98 - 111 mmol/L   CO2 27 22 - 32 mmol/L   Glucose, Bld 195 (H) 70 - 99 mg/dL    Comment: Glucose reference range applies only to samples taken after fasting for at least 8 hours.   BUN 12 6 - 20 mg/dL  Creatinine, Ser 0.87 0.44 - 1.00 mg/dL   Calcium 9.8 8.9 - 10.3 mg/dL   Total Protein 7.2 6.5 - 8.1 g/dL   Albumin 3.8 3.5 - 5.0 g/dL   AST 18 15 - 41 U/L   ALT 21 0 - 44 U/L   Alkaline Phosphatase 77 38 - 126 U/L   Total Bilirubin 0.7 0.3 - 1.2 mg/dL   GFR calc non Af Amer >60 >60 mL/min   GFR calc Af Amer >60 >60 mL/min   Anion gap 9 5 - 15    Comment: Performed at [facility], [address] [place], [place] [address]  CBC with Differential/Platelet     Status: Abnormal (Preliminary result)   Collection Time: 03/12/20  2:56 PM  Result Value Ref Range   WBC 10.5 4.0 - 10.5 K/uL   RBC 4.93 3.87 - 5.11 MIL/uL   Hemoglobin 16.3 (H) 12.0 - 15.0 g/dL   HCT 49.3 (H) 36.0 - 46.0 %   MCV 100.0 80.0 - 100.0 fL   MCH 33.1 26.0 - 34.0 pg   MCHC 33.1 30.0 - 36.0 g/dL   RDW 14.0 11.5 - 15.5 %   Platelets 316 150 - 400 K/uL   nRBC 0.2 0.0 - 0.2 %    Comment: Performed at [facility], [address] [place], [place] [address]   Neutrophils Relative % PENDING %   Neutro Abs PENDING 1.7 - 7.7 K/uL   Band Neutrophils PENDING %   Lymphocytes Relative PENDING %   Lymphs Abs PENDING 0.7 - 4.0 K/uL   Monocytes Relative PENDING %   Monocytes Absolute PENDING 0.1 - 1.0 K/uL   Eosinophils Relative PENDING %   Eosinophils Absolute PENDING 0.0 - 0.5 K/uL   Basophils Relative PENDING %   Basophils Absolute PENDING 0.0 - 0.1 K/uL   WBC Morphology PENDING    RBC Morphology PENDING    Smear Review PENDING    Other PENDING %   nRBC PENDING 0 /100 WBC   Metamyelocytes Relative PENDING %   Myelocytes PENDING %   Promyelocytes Relative PENDING %   Blasts PENDING %   Immature Granulocytes PENDING %   Abs Immature  Granulocytes PENDING 0.00 - 0.07 K/uL  Lactate dehydrogenase     Status: None   Collection Time: 03/12/20  2:56 PM  Result Value Ref Range   LDH 157 98 - 192 U/L    Comment: Performed at [facility], [address] [place], [place] [address]    RADIOGRAPHIC STUDIES: I have personally reviewed the radiological images as listed and agreed with the findings in the report.  ASSESSMENT:  1.  Leukocytosis: -CBC on 02/20/2020 shows white count 12.3.  She also had elevated hemoglobin of 17.6 and hematocrit of 51.8.  Differential showed elevated lymphocytes and monocytes. -She had intermittent leukocytosis for the past few years. 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 -She does not have any B symptoms.  No steroid use.  No chronic infections. -She is a current active smoker, 1 and half pack per day since age 4.  2.  Stage I left breast cancer, ER positive, HER-2 positive: -Diagnosed on 12/10/2005, status post lumpectomy. -Taxotere and Cytoxan plus Herceptin for 6 cycles followed by 1 year of Herceptin. -5 years of tamoxifen completed in November 2012.  PLAN:  1.  Leukocytosis: -We will check for JAK2 V617F and BCR/ABL.  We will also check for lymphoproliferative disorders by sending flow cytometry.  Repeat  CBC and review smear. -If the above tests are negative, can be resumed that she has leukocytosis from splenectomy and smoking.  2.  Erythrocytosis: -We will also check for JAK2 V617F mutation for polycythemia.  3.  Left breast cancer: -Mammogram on 11/14/2018 was BI-RADS Category 1.   All questions were answered. The patient knows to call the clinic with any problems, questions or concerns.      Derek Jack, MD 03/12/20 6:25 PM

## 2020-03-12 NOTE — Progress Notes (Signed)
I attempted to call patient today to screen for our lung cancer screening program. I left her a message and asked that she return our call at her earliest convenience.

## 2020-03-13 LAB — SURGICAL PATHOLOGY

## 2020-03-14 ENCOUNTER — Encounter (HOSPITAL_COMMUNITY): Payer: Self-pay | Admitting: *Deleted

## 2020-03-14 ENCOUNTER — Other Ambulatory Visit (HOSPITAL_COMMUNITY): Payer: Self-pay | Admitting: *Deleted

## 2020-03-14 DIAGNOSIS — Z87891 Personal history of nicotine dependence: Secondary | ICD-10-CM

## 2020-03-14 DIAGNOSIS — Z122 Encounter for screening for malignant neoplasm of respiratory organs: Secondary | ICD-10-CM

## 2020-03-14 NOTE — Progress Notes (Signed)
Received referral for initial lung cancer screening scan from Dr. [name].  Contacted patient and obtained smoking history (started age 59 less than a quarter pack, around age 61 started smoking 1 ppd, current smoker, She did quit for 2 years, 41.5 pack year) as well as answering questions related to the screening process.  Patient denies signs/symptoms of lung cancer such as weight loss or hemoptysis.  She does report a chronic cough that she has had for over 1 year. Patient denies comorbidity that would prevent curative treatment if lung cancer were to be found.  Patient is scheduled for shared decision making visit and CT scan on 6/18 at 1300.

## 2020-03-14 NOTE — Progress Notes (Signed)
Orders placed for LDCT 

## 2020-03-16 LAB — MISC LABCORP TEST (SEND OUT): Labcorp test code: 738526

## 2020-03-17 LAB — BCR-ABL1, CML/ALL, PCR, QUANT: Interpretation (BCRAL):: NEGATIVE

## 2020-03-17 LAB — FLOW CYTOMETRY

## 2020-03-19 ENCOUNTER — Other Ambulatory Visit: Payer: Self-pay | Admitting: Family Medicine

## 2020-03-19 ENCOUNTER — Encounter: Payer: Self-pay | Admitting: Family Medicine

## 2020-03-19 DIAGNOSIS — E1065 Type 1 diabetes mellitus with hyperglycemia: Secondary | ICD-10-CM

## 2020-03-20 LAB — JAK2 V617F, W REFLEX TO CALR/E12/MPL

## 2020-03-20 LAB — CALR + JAK2 E12-15 + MPL (REFLEXED)

## 2020-03-21 ENCOUNTER — Inpatient Hospital Stay (HOSPITAL_COMMUNITY): Payer: BC Managed Care – PPO | Attending: Hematology | Admitting: Nurse Practitioner

## 2020-03-21 ENCOUNTER — Encounter: Payer: Self-pay | Admitting: Family Medicine

## 2020-03-21 ENCOUNTER — Other Ambulatory Visit: Payer: Self-pay

## 2020-03-21 ENCOUNTER — Ambulatory Visit (HOSPITAL_COMMUNITY)
Admission: RE | Admit: 2020-03-21 | Discharge: 2020-03-21 | Disposition: A | Discharge status: Home / Self Care | Payer: BC Managed Care – PPO | Source: Ambulatory Visit | Attending: Hematology | Admitting: Hematology

## 2020-03-21 DIAGNOSIS — F17210 Nicotine dependence, cigarettes, uncomplicated: Secondary | ICD-10-CM | POA: Diagnosis not present

## 2020-03-21 DIAGNOSIS — Z87891 Personal history of nicotine dependence: Secondary | ICD-10-CM | POA: Insufficient documentation

## 2020-03-21 DIAGNOSIS — Z122 Encounter for screening for malignant neoplasm of respiratory organs: Secondary | ICD-10-CM | POA: Insufficient documentation

## 2020-03-21 DIAGNOSIS — Z720 Tobacco use: Secondary | ICD-10-CM

## 2020-03-21 NOTE — Assessment & Plan Note (Signed)
1.  Tobacco abuse: -This patient meets criteria for low-dose CT lung cancer screening. -She is asymptomatic for any signs and symptoms of lung cancer. -This shared decision making visit discussion included risk and benefits of screening, potential for follow-up, diagnostic testing for abnormal scans, potential for false positives, over diagnosis, and discussion about total radiation exposure. -Patient stated willingness to undergo diagnostics and treatment as needed. -Patient was counseled on smoking cessation to decrease risk of lung cancer, pulmonary disease, heart disease and stroke. -Patient was given a resource card with information on receiving free nicotine replacement therapy and information about free smoking cessation classes. -Patient will present for LD CT scan today and follow-up with PCP.

## 2020-03-21 NOTE — Assessment & Plan Note (Signed)
Supplies needed , as in mask and tubing are prescribed

## 2020-03-21 NOTE — Patient Instructions (Signed)
You were seen today for your shared decision making visit and a low-dose CT scan for lung cancer screening.    

## 2020-03-21 NOTE — Assessment & Plan Note (Signed)
Obesity linked with diabetes and arthritis  Patient re-educated about  the importance of commitment to a  minimum of 150 minutes of exercise per week as able.  The importance of healthy food choices with portion control discussed, as well as eating regularly and within a 12 hour window most days. The need to choose "clean , green" food 50 to 75% of the time is discussed, as well as to make water the primary drink and set a goal of 64 ounces water daily.    Weight /BMI 03/12/2020 03/11/2020 11/06/2019  WEIGHT 221 lb 12.8 oz 221 lb 228 lb  HEIGHT 5' 5.5" 5\' 6"  5\' 6"   BMI 36.35 kg/m2 35.67 kg/m2 36.8 kg/m2

## 2020-03-21 NOTE — Assessment & Plan Note (Signed)
Annette [name] is reminded of the importance of commitment to daily physical activity for 30 minutes or more, as able and the need to limit carbohydrate intake to 30 to 60 grams per meal to help with blood sugar control.   The need to take medication as prescribed, test blood sugar as directed, and to call between visits if there is a concern that blood sugar is uncontrolled is also discussed.   Annette [name] is reminded of the importance of daily foot exam, annual eye examination, and good blood sugar, blood pressure and cholesterol control. Uncontrolled , increase glipizide dose and send in readings weekly until she establishes with new Endo  Diabetic Labs Latest Ref Rng & Units 03/12/2020 03/11/2020 02/20/2020 11/01/2019 08/27/2019  HbA1c 4.0 - 5.6 % - 9.4(A) - - -  Microalbumin Not Estab. ug/mL - - - - -  Micro/Creat Ratio 0.0 - 30.0 mg/g creat - - - - -  Chol 100 - 199 mg/dL - - 137 - -  HDL >39 mg/dL - - 41 - -  Calc LDL 0 - 99 mg/dL - - 75 - -  Triglycerides 0 - 149 mg/dL - - 114 - -  Creatinine 0.44 - 1.00 mg/dL 0.87 - 1.07(H) 0.95 0.91   BP/Weight 03/12/2020 03/11/2020 11/06/2019 11/01/2019 10/23/2019 10/22/2019 29/02/2840  Systolic BP 324 401 027 253 664 92 -  Diastolic BP 55 70 63 63 68 64 -  Wt. (Lbs) 221.8 221 228 228 228 228 229  BMI 36.35 35.67 36.8 36.8 36.8 36.8 36.96   Foot/eye exam completion dates 08/23/2019 07/15/2014  Foot Form Completion Done Done

## 2020-03-21 NOTE — Progress Notes (Signed)
Annette [name]     MRN: [id number]      DOB: [birth year]   HPI Annette [name] is here for follow up and re-evaluation of chronic medical conditions, medication management and review of any available recent lab and radiology data.  Preventive health is updated, specifically  Cancer screening and Immunization.   Questions or concerns regarding consultations or procedures which the PT has had in the interim are  addressed. The PT denies any adverse reactions to current medications since the last visit.  C/o numbness in tips of fingers and in feet. C/O uncontrolled blood sugar an no longer wants to follow up withwith the Endocrinologist she chose to be referred to. Is overwhelmed by her multiple he.alth conditions, and to compound this was terminated from her job unexpectedly. She is now seeking disability, which she may indeed qualify for ROS Denies recent fever or chills. Denies sinus pressure, nasal congestion, ear pain or sore throat. Denies chest congestion, productive cough or wheezing. Denies chest pains, palpitations and leg swelling Denies abdominal pain, nausea, vomiting,diarrhea or constipation.   Denies dysuria, frequency, hesitancy or incontinence. C/o chronic joint pain,  and limitation in mobility.  Denies uncontrolled  depression, anxiety or insomnia. Denies skin break down or rash.   PE  BP 108/70   Pulse 76   Temp (!) 97.3 F (36.3 C) (Temporal)   Ht 5\' 6"  (1.676 m)   Wt 221 lb (100.2 kg)   SpO2 94%   BMI 35.67 kg/m   Patient alert and oriented and in no cardiopulmonary distress.  HEENT: No facial asymmetry, EOMI,     Neck supple .  Chest: Clear to auscultation bilaterally.Decreased air entry throughout  CVS: S1, S2 no murmurs, no S3.Regular rate.  Ext: No edema  MS: Decreased  ROM spine, shoulders, hips and knees.  Skin: Intact, no ulcerations or rash noted.  Psych: Good eye contact, normal affect. Memory intact mildly  anxious and  mildly  depressed  appearing.  CNS: CN 2-12 intact, power,  normal throughout.no focal deficits noted.   Assessment & Plan  Nicotine dependence Needs to have annual screening Asked:confirms currently smokes cigarettes, 1PPD Assess: Unwilling to quit but cutting back Advise: needs to QUIT to reduce risk of cancer, cardio and cerebrovascular disease Assist: counseled for 5 minutes and literature provided Arrange: follow up in 3 months    Type 2 diabetes mellitus with other specified complication ([place]) Annette [name] is reminded of the importance of commitment to daily physical activity for 30 minutes or more, as able and the need to limit carbohydrate intake to 30 to 60 grams per meal to help with blood sugar control.   The need to take medication as prescribed, test blood sugar as directed, and to call between visits if there is a concern that blood sugar is uncontrolled is also discussed.   Annette [name] is reminded of the importance of daily foot exam, annual eye examination, and good blood sugar, blood pressure and cholesterol control. Uncontrolled , increase glipizide dose and send in readings weekly until she establishes with new Endo  Diabetic Labs Latest Ref Rng & Units 03/12/2020 03/11/2020 02/20/2020 11/01/2019 08/27/2019  HbA1c 4.0 - 5.6 % - 9.4(A) - - -  Microalbumin Not Estab. ug/mL - - - - -  Micro/Creat Ratio 0.0 - 30.0 mg/g creat - - - - -  Chol 100 - 199 mg/dL - - 137 - -  HDL >39 mg/dL - - 41 - -  Calc LDL 0 -  99 mg/dL - - 75 - -  Triglycerides 0 - 149 mg/dL - - 114 - -  Creatinine 0.44 - 1.00 mg/dL 0.87 - 1.07(H) 0.95 0.91   BP/Weight 03/12/2020 03/11/2020 11/06/2019 11/01/2019 10/23/2019 10/22/2019 17/04/1164  Systolic BP 790 383 338 329 191 92 -  Diastolic BP 55 70 63 63 68 64 -  Wt. (Lbs) 221.8 221 228 228 228 228 229  BMI 36.35 35.67 36.8 36.8 36.8 36.8 36.96   Foot/eye exam completion dates 08/23/2019 07/15/2014  Foot Form Completion Done Done        Hyperlipidemia LDL goal  <100 Hyperlipidemia:Low fat diet discussed and encouraged.   Lipid Panel  Lab Results  Component Value Date   CHOL 137 02/20/2020   HDL 41 02/20/2020   LDLCALC 75 02/20/2020   TRIG 114 02/20/2020   CHOLHDL 3.1 11/21/2018   Controlled, no change in medication     Morbid obesity ([place]) Obesity linked with diabetes and arthritis  Patient re-educated about  the importance of commitment to a  minimum of 150 minutes of exercise per week as able.  The importance of healthy food choices with portion control discussed, as well as eating regularly and within a 12 hour window most days. The need to choose "clean , green" food 50 to 75% of the time is discussed, as well as to make water the primary drink and set a goal of 64 ounces water daily.    Weight /BMI 03/12/2020 03/11/2020 11/06/2019  WEIGHT 221 lb 12.8 oz 221 lb 228 lb  HEIGHT 5' 5.5" 5\' 6"  5\' 6"   BMI 36.35 kg/m2 35.67 kg/m2 36.8 kg/m2      Insomnia Sleep hygiene reviewed and written information offered also. Prescription sent for  medication needed.   Sleep apnea Supplies needed , as in mask and tubing are prescribed

## 2020-03-21 NOTE — Assessment & Plan Note (Signed)
Sleep hygiene reviewed and written information offered also. Prescription sent for  medication needed.  

## 2020-03-21 NOTE — Assessment & Plan Note (Signed)
Hyperlipidemia:Low fat diet discussed and encouraged.   Lipid Panel  Lab Results  Component Value Date   CHOL 137 02/20/2020   HDL 41 02/20/2020   LDLCALC 75 02/20/2020   TRIG 114 02/20/2020   CHOLHDL 3.1 11/21/2018   Controlled, no change in medication    

## 2020-03-21 NOTE — Progress Notes (Signed)
[facility] NOTE  Patient Care Team: Fayrene Helper, MD as PCP - General O'[place], Cassie Freer, MD as PCP - Cardiology (Cardiology)  CHIEF COMPLAINTS/PURPOSE OF CONSULTATION: Shared decision making visit for lung cancer screening.  HISTORY OF PRESENTING ILLNESS:  Annette [name] 59 y.o. female presents today for shared decision-making visit for lung cancer screening.  She has a past medical history significant for breast cancer, depression, diabetes, GERD, hypothyroidism, obesity, and sleep apnea.  Patient states she started smoking at the age of 65.  She has smoked 1 pack of cigarettes a day to equal 41.5-pack-year smoked.  She reports occasional cough without sputum production.  She reports some shortness of breath on exertion that does resolve with rest.  She denies any alcohol or illicit drug use.    MEDICAL HISTORY:  Past Medical History:  Diagnosis Date  . Breast cancer ([place]) 2007   History of  XRT, onTamoxifen  . Bruises easily   . Depression   . Diabetes mellitus    prediabetic  . GERD (gastroesophageal reflux disease)   . Hypertriglyceridemia   . Hypothyroidism    following chemo amnd radiaition for breast cancer, needed replacement short term  . Hypothyroidism (acquired)    replaced x 1 year  . IBS (irritable bowel syndrome)   . Menieres disease    Controlled with triamterene   . Migraines   . Obesity   . Palpitations   . 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[place])   . Sleep apnea 2010   Problems with CPAP    SURGICAL HISTORY: Past Surgical History:  Procedure Laterality Date  . ABDOMINAL HYSTERECTOMY  1998   Benign, fibroids  . 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 APPENDECTOMY  06/12/08  . BREAST RECONSTRUCTION     Left reconstructive  . BREAST SURGERY  2007   Left lumpectomy  . BREAST SURGERY     Mammosite - right side  . CHOLECYSTECTOMY  1985  . ESOPHAGEAL DILATION  12/19/2015   Procedure:  ESOPHAGEAL DILATION;  Surgeon: Rogene Houston, MD;  Location: [facility];  Service: Endoscopy;;  . ESOPHAGOGASTRODUODENOSCOPY N/A 12/19/2015   Procedure: ESOPHAGOGASTRODUODENOSCOPY (EGD);  Surgeon: Rogene Houston, MD;  Location: [facility];  Service: Endoscopy;  Laterality: N/A;  11:40  . 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 THYROIDECTOMY, PARTIAL  11/05/2010   Benign disease    SOCIAL HISTORY: Social History   Socioeconomic History  . Marital status: Married    Spouse name: Alroy Dust  . Number of children: 2  . Years of education: Not on file  . Highest education level: Not on file  Occupational History  . Not on file  Tobacco Use  . Smoking status: Current Every Day Smoker    Packs/day: 0.50    Years: 40.00    Pack years: 20.00    Types: Cigarettes  . Smokeless tobacco: Never Used  . Tobacco comment: 1/2 pack a day  Substance and Sexual Activity  . Alcohol use: No    Alcohol/week: 0.0 standard drinks    Comment: Encouraged to quit smoking. She has tried the nicotone gum and patches but did not help  . Drug use: No  . Sexual activity: Yes    Birth control/protection: Surgical  Other Topics Concern  . Not on file  Social History Narrative  . Not on file   Social Determinants of Health   Financial Resource  Strain:   . Difficulty of Paying Living Expenses:   Food Insecurity:   . Worried About Charity fundraiser in the Last Year:   . Arboriculturist in the Last Year:   Transportation Needs:   . Film/video editor (Medical):   Marland Kitchen Lack of Transportation (Non-Medical):   Physical Activity:   . Days of Exercise per Week:   . Minutes of Exercise per Session:   Stress:   . Feeling of Stress :   Social Connections:   . Frequency of Communication with Friends and Family:   . Frequency of Social Gatherings with Friends and Family:   . Attends Religious Services:   . Active Member of Clubs or Organizations:   .  Attends Archivist Meetings:   Marland Kitchen Marital Status:   Intimate Partner Violence:   . Fear of Current or Ex-Partner:   . Emotionally Abused:   Marland Kitchen Physically Abused:   . Sexually Abused:     FAMILY HISTORY: Family History  Problem Relation Age of Onset  . Diabetes Mother   . Arrhythmia Mother   . Heart disease Father   . Cancer Paternal Grandmother        Breast  . Diabetes Brother   . Heart disease Brother     ALLERGIES:  is allergic to penicillins, metformin and related, statins, tape, and zetia [ezetimibe].  MEDICATIONS:  Current Outpatient Medications  Medication Sig Dispense Refill  . aspirin EC 81 MG tablet Take 81 mg by mouth daily.    . Carboxymethylcellulose Sodium (EYE DROPS OP) Apply 1 drop to eye 2 (two) times daily as needed (FOR DRY EYE).    Marland Kitchen esomeprazole (NEXIUM) 40 MG capsule TAKE 1 CAPSULE BY MOUTH EVERY DAY 90 capsule 3  . glipiZIDE (GLUCOTROL XL) 10 MG 24 hr tablet Take two tablets by mouth every morning with breakfast 60 tablet 5  . insulin glargine (LANTUS) 100 UNIT/ML Solostar Pen Inject 30 Units into the skin at bedtime. 5 pen 0  . JARDIANCE 25 MG TABS tablet Take 25 mg by mouth daily.    Marland Kitchen LORazepam (ATIVAN) 1 MG tablet TAKE ONE TABLET BY MOUTH EVERY 8 HOURS AS NEEDED FOR ANXIETY AND/OR NAUSEA (Patient not taking: Reported on 03/12/2020) 30 tablet 5  . LORazepam (ATIVAN) 1 MG tablet Take one tablet by mouth once daily, as needed, for anxiety and / or nausea (Patient not taking: Reported on 03/12/2020) 30 tablet 0  . Metoprolol Tartrate 37.5 MG TABS TAKE 1 TABLET BY MOUTH TWICE DAILY (Patient taking differently: Take 37.5 mg by mouth 2 (two) times daily. ) 180 tablet 3  . naproxen (NAPROSYN) 500 MG tablet Take 1 tablet (500 mg total) by mouth 2 (two) times daily with a meal. 40 tablet 0  . ONETOUCH VERIO test strip USE TO TEST UP TO 4 TIMES PER DAY 150 strip 5  . rosuvastatin (CRESTOR) 20 MG tablet TAKE 1 TABLET(20 MG) BY MOUTH DAILY 90 tablet 3  .  temazepam (RESTORIL) 15 MG capsule Take 1 capsule (15 mg total) by mouth at bedtime. 30 capsule 4  . traZODone (DESYREL) 100 MG tablet TAKE 1 TABLET(100 MG) BY MOUTH AT BEDTIME 90 tablet 3  . triamterene-hydrochlorothiazide (DYAZIDE) 37.5-25 MG capsule Take 1 each (1 capsule total) by mouth daily. 90 capsule 1  . venlafaxine XR (EFFEXOR-XR) 75 MG 24 hr capsule TAKE 1 CAPSULE(75 MG) BY MOUTH DAILY 90 capsule 1  . Vitamin D, Ergocalciferol, (DRISDOL) 1.25 MG (50000 UT)  CAPS capsule Take 1 capsule (50,000 Units total) by mouth every 7 (seven) days. 1 capsule by mouth weekly (Patient taking differently: Take 50,000 Units by mouth every Tuesday. ) 12 capsule 3   No current facility-administered medications for this visit.    LABORATORY DATA:  I have reviewed the data as listed Lab Results  Component Value Date   WBC 10.5 03/12/2020   HGB 16.3 (H) 03/12/2020   HCT 49.3 (H) 03/12/2020   MCV 100.0 03/12/2020   PLT 316 03/12/2020     Chemistry      Component Value Date/Time   NA 139 03/12/2020 1456   NA 142 02/20/2020 1157   K 4.2 03/12/2020 1456   CL 103 03/12/2020 1456   CO2 27 03/12/2020 1456   BUN 12 03/12/2020 1456   BUN 15 02/20/2020 1157   CREATININE 0.87 03/12/2020 1456   CREATININE 0.74 11/15/2014 1119      Component Value Date/Time   CALCIUM 9.8 03/12/2020 1456   ALKPHOS 77 03/12/2020 1456   AST 18 03/12/2020 1456   ALT 21 03/12/2020 1456   BILITOT 0.7 03/12/2020 1456   BILITOT 0.5 02/20/2020 1157      ASSESSMENT & PLAN:  Tobacco abuse 1.  Tobacco abuse: -This patient meets criteria for low-dose CT lung cancer screening. -She is asymptomatic for any signs and symptoms of lung cancer. -This shared decision making visit discussion included risk and benefits of screening, potential for follow-up, diagnostic testing for abnormal scans, potential for false positives, over diagnosis, and discussion about total radiation exposure. -Patient stated willingness to undergo  diagnostics and treatment as needed. -Patient was counseled on smoking cessation to decrease risk of lung cancer, pulmonary disease, heart disease and stroke. -Patient was given a resource card with information on receiving free nicotine replacement therapy and information about free smoking cessation classes. -Patient will present for LD CT scan today and follow-up with PCP.    All questions were answered. The patient knows to call the clinic with any problems, questions or concerns. I spent 10 minutes counseling the patient face to face. The total time spent in the appointment was 20 minutes and more than 50% was on counseling.     Glennie Isle, NP-C 03/21/2020 9:12 AM

## 2020-03-24 ENCOUNTER — Encounter (HOSPITAL_COMMUNITY): Payer: Self-pay | Admitting: *Deleted

## 2020-03-24 NOTE — Progress Notes (Signed)
Patient notified via mail of LDCT lung cancer screening results with recommendations to follow up in 12 months.  Also notified of incidental findings and need to follow up with PCP.  Patient's referring provider was sent a copy of results.    IMPRESSION: 1. Lung-RADS 1, negative. Continue annual screening with low-dose chest CT without contrast in 12 months. 2. Right adrenal adenoma. 3. Aortic Atherosclerosis (ICD10-I70.0) and Emphysema (ICD10-J43.9).

## 2020-03-25 ENCOUNTER — Other Ambulatory Visit: Payer: Self-pay | Admitting: Family Medicine

## 2020-03-25 ENCOUNTER — Encounter: Payer: Self-pay | Admitting: Family Medicine

## 2020-03-25 NOTE — Progress Notes (Signed)
lantus 40

## 2020-03-26 ENCOUNTER — Encounter (INDEPENDENT_AMBULATORY_CARE_PROVIDER_SITE_OTHER): Payer: Self-pay | Admitting: *Deleted

## 2020-04-02 ENCOUNTER — Encounter: Payer: Self-pay | Admitting: Family Medicine

## 2020-04-08 ENCOUNTER — Encounter: Payer: Self-pay | Admitting: Family Medicine

## 2020-04-08 ENCOUNTER — Other Ambulatory Visit: Payer: Self-pay | Admitting: Family Medicine

## 2020-04-09 ENCOUNTER — Other Ambulatory Visit: Payer: Self-pay | Admitting: Family Medicine

## 2020-04-14 ENCOUNTER — Ambulatory Visit (HOSPITAL_COMMUNITY): Payer: BC Managed Care – PPO | Admitting: Hematology

## 2020-04-17 ENCOUNTER — Encounter: Payer: Self-pay | Admitting: Internal Medicine

## 2020-04-17 ENCOUNTER — Other Ambulatory Visit: Payer: Self-pay

## 2020-04-17 ENCOUNTER — Ambulatory Visit: Payer: BC Managed Care – PPO | Admitting: Internal Medicine

## 2020-04-17 VITALS — BP 100/60 | HR 75 | Ht 65.5 in | Wt 221.0 lb

## 2020-04-17 DIAGNOSIS — Z794 Long term (current) use of insulin: Secondary | ICD-10-CM | POA: Diagnosis not present

## 2020-04-17 DIAGNOSIS — E1165 Type 2 diabetes mellitus with hyperglycemia: Secondary | ICD-10-CM

## 2020-04-17 MED ORDER — TRULICITY 1.5 MG/0.5ML ~~LOC~~ SOAJ: 1.5000 mg | SUBCUTANEOUS | 11 refills | Status: DC

## 2020-04-17 MED ORDER — INSULIN PEN NEEDLE 32G X 4 MM MISC: 3 refills | Status: DC

## 2020-04-17 MED ORDER — TRULICITY 0.75 MG/0.5ML ~~LOC~~ SOAJ: SUBCUTANEOUS | 2 refills | Status: DC

## 2020-04-17 MED ORDER — TRESIBA FLEXTOUCH 200 UNIT/ML ~~LOC~~ SOPN: 50.0000 [IU] | PEN_INJECTOR | Freq: Every day | SUBCUTANEOUS | 3 refills | Status: DC

## 2020-04-17 NOTE — Patient Instructions (Addendum)
Please decrease: - Glipizide XL to 10 mg before breakfast  Please start: - Trulicity 6.04 mg weekly. After 1 month, try to increase to the higher dose (1.5 mg).  Change: - Lantus to Tresiba 50 units at bedtime   Please return in 2 months with your sugar log.   PATIENT INSTRUCTIONS FOR TYPE 2 DIABETES:  DIET AND EXERCISE Diet and exercise is an important part of diabetic treatment.  We recommended aerobic exercise in the form of brisk walking (working between 40-60% of maximal aerobic capacity, similar to brisk walking) for 150 minutes per week (such as 30 minutes five days per week) along with 3 times per week performing 'resistance' training (using various gauge rubber tubes with handles) 5-10 exercises involving the major muscle groups (upper body, lower body and core) performing 10-15 repetitions (or near fatigue) each exercise. Start at half the above goal but build slowly to reach the above goals. If limited by weight, joint pain, or disability, we recommend daily walking in a swimming pool with water up to waist to reduce pressure from joints while allow for adequate exercise.    BLOOD GLUCOSES Monitoring your blood glucoses is important for continued management of your diabetes. Please check your blood glucoses 2-4 times a day: fasting, before meals and at bedtime (you can rotate these measurements - e.g. one day check before the 3 meals, the next day check before 2 of the meals and before bedtime, etc.).   HYPOGLYCEMIA (low blood sugar) Hypoglycemia is usually a reaction to not eating, exercising, or taking too much insulin/ other diabetes drugs.  Symptoms include tremors, sweating, hunger, confusion, headache, etc. Treat IMMEDIATELY with 15 grams of Carbs: . 4 glucose tablets .  cup regular juice/soda . 2 tablespoons raisins . 4 teaspoons sugar . 1 tablespoon honey Recheck blood glucose in 15 mins and repeat above if still symptomatic/blood glucose <100.  RECOMMENDATIONS TO  REDUCE YOUR RISK OF DIABETIC COMPLICATIONS: * Take your prescribed MEDICATION(S) * Follow a DIABETIC diet: Complex carbs, fiber rich foods, (monounsaturated and polyunsaturated) fats * AVOID saturated/trans fats, high fat foods, >2,300 mg salt per day. * EXERCISE at least 5 times a week for 30 minutes or preferably daily.  * DO NOT SMOKE OR DRINK more than 1 drink a day. * Check your FEET every day. Do not wear tightfitting shoes. Contact us if you develop an ulcer * See your EYE doctor once a year or more if needed * Get a FLU shot once a year * Get a PNEUMONIA vaccine once before and once after age 34 years  GOALS:  * Your Hemoglobin A1c of <7%  * fasting sugars need to be <130 * after meals sugars need to be <180 (2h after you start eating) * Your Systolic BP should be 540 or lower  * Your Diastolic BP should be 80 or lower  * Your HDL (Good Cholesterol) should be 40 or higher  * Your LDL (Bad Cholesterol) should be 100 or lower. * Your Triglycerides should be 150 or lower  * Your Urine microalbumin (kidney function) should be <30 * Your Body Mass Index should be 25 or lower    Please consider the following ways to cut down carbs and fat and increase fiber and micronutrients in your diet: - substitute whole grain for white bread or pasta - substitute brown rice for white rice - substitute 90-calorie flat bread pieces for slices of bread when possible - substitute sweet potatoes or yams for white potatoes -  substitute humus for margarine - substitute tofu for cheese when possible - substitute almond or rice milk for regular milk (would not drink soy milk daily due to concern for soy estrogen influence on breast cancer risk) - substitute dark chocolate for other sweets when possible - substitute water - can add lemon or orange slices for taste - for diet sodas (artificial sweeteners will trick your body that you can eat sweets without getting calories and will lead you to overeating  and weight gain in the long run) - do not skip breakfast or other meals (this will slow down the metabolism and will result in more weight gain over time)  - can try smoothies made from fruit and almond/rice milk in am instead of regular breakfast - can also try old-fashioned (not instant) oatmeal made with almond/rice milk in am - order the dressing on the side when eating salad at a restaurant (pour less than half of the dressing on the salad) - eat as little meat as possible - can try juicing, but should not forget that juicing will get rid of the fiber, so would alternate with eating raw veg./fruits or drinking smoothies - use as little oil as possible, even when using olive oil - can dress a salad with a mix of balsamic vinegar and lemon juice, for e.g. - use agave nectar, stevia sugar, or regular sugar rather than artificial sweateners - steam or broil/roast veggies  - snack on veggies/fruit/nuts (unsalted, preferably) when possible, rather than processed foods - reduce or eliminate aspartame in diet (it is in diet sodas, chewing gum, etc) Read the labels!  Try to read Dr. Janene Harvey book: "Program for Reversing Diabetes" for other ideas for healthy eating.

## 2020-04-17 NOTE — Progress Notes (Signed)
Patient ID: Annette [name], female   DOB: 01/12/1961, 59 y.o.   MRN: [id number]   This visit occurred during the SARS-CoV-2 public health emergency.  Safety protocols were in place, including screening questions prior to the visit, additional usage of staff PPE, and extensive cleaning of exam room while observing appropriate contact time as indicated for disinfecting solutions.   HPI: Annette [name] is a 59 y.o.-year-old female, referred by her PCP, Dr. Moshe Cipro, for management of DM2, dx in 2018, insulin-dependent since 03/2019, uncontrolled, without long-term complications.  She previously saw Dr. Dorris Fetch and Dr. Chalmers Cater but would like to switch to see me.  Reviewed HbA1c levels: Lab Results  Component Value Date   HGBA1C 9.4 (A) 03/11/2020   HGBA1C 7.5 (H) 07/25/2019   HGBA1C 8.3 (H) 03/12/2019   HGBA1C 8.4 (H) 11/21/2018   HGBA1C 8.0 (H) 08/08/2018   HGBA1C 6.9 (H) 01/19/2018   HGBA1C 7.2 (H) 06/14/2017   HGBA1C 6.4 (A) 07/17/2015   HGBA1C 6.0 (H) 11/15/2014   HGBA1C 5.9 (H) 07/12/2014   Pt is on a regimen of: - Glipizide XL 20 mg before breakfast - Lantus 50 units at bedtime (dose increased 04/08/2020) She could not tolerate Metformin due to GI intolerance - abdominal pain and diarrhea. She had vaginal yeast infections and UTIs from [place]. Previously on Tresiba, but unclear why changed to Lantus.  Pt checks her sugars 2x a day and they are: - am: 108, 113-180, 237, 286 - 2h after b'fast: n/c - before lunch: n/c - 2h after lunch: n/c - before dinner: 93-267 - 2h after dinner: 243, 254 - bedtime: n/c - nighttime: n/c Lowest sugar was 57 >>  93; she has hypoglycemia awareness at 90s.  Highest sugar was 425.  Glucometer: One Touch verio  Pt's meals are - 1 meal a day: 5-7 pm!!! - Breakfast: usually skips - Lunch: skips or Atkins bar - Dinner: meat + potatoes + veggies - Snacks: fruits She only drinks unsweet tea and coffee.  - no CKD, last BUN/creatinine:  Lab  Results  Component Value Date   BUN 12 03/12/2020   BUN 15 02/20/2020   CREATININE 0.87 03/12/2020   CREATININE 1.07 (H) 02/20/2020  She is not on ACE inhibitor/ARB.  -+ HL; last set of lipids: Lab Results  Component Value Date   CHOL 137 02/20/2020   HDL 41 02/20/2020   LDLCALC 75 02/20/2020   TRIG 114 02/20/2020   CHOLHDL 3.1 11/21/2018  On Crestor 20.  She had side effects from Zetia - not feeling well.  - last eye exam was in fall 2020. No DR.  She sometimes has double vision.  - no numbness but some tingling in her feet. Her R thumb is half numb.  Pt has FH of DM in mother, brother, sister.  She has a history of obesity class III, HTN, OSA.  She had COVID-19 10/2019.  She recovered well afterwards.  She also had partial thyroidectomy and also adrenalectomy for benign adrenal nodule by Dr. Celine Ahr in the past  Latest TSH was reviewed and this was normal: Lab Results  Component Value Date   TSH 2.230 02/20/2020    ROS: Constitutional: + Weight gain, no weight loss, + fatigue, + hot flashes, no subjective hypothermia, no nocturia, + poor sleep + dysuria Eyes: + Blurry vision, no xerophthalmia ENT: no sore throat, no nodules palpated in neck, no dysphagia, no odynophagia, no hoarseness, no tinnitus, no hypoacusis Cardiovascular: + All: CP, shortness of breath, palpitations,  leg swelling Respiratory: + Cough, + SOB, no wheezing Gastrointestinal: + N, no V, + D, + C, + acid reflux Musculoskeletal: + Muscle and joint pains Skin: no rash, no hair loss, + easy bruising Neurological: no tremors, no numbness or tingling/no dizziness/+ HAs Psychiatric: + depression, no anxiety + Low libido  Past Medical History:  Diagnosis Date  . Breast cancer ([place]) 2007   History of  XRT, onTamoxifen  . Bruises easily   . Depression   . Diabetes mellitus    prediabetic  . GERD (gastroesophageal reflux disease)   . Hypertriglyceridemia   . Hypothyroidism    following chemo amnd  radiaition for breast cancer, needed replacement short term  . Hypothyroidism (acquired)    replaced x 1 year  . IBS (irritable bowel syndrome)   . Menieres disease    Controlled with triamterene   . Migraines   . Obesity   . Palpitations   . 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[employer])   . Sleep apnea 2010   Problems with CPAP   Past Surgical History:  Procedure Laterality Date  . ABDOMINAL HYSTERECTOMY  1998   Benign, fibroids  . 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 APPENDECTOMY  06/12/08  . BREAST RECONSTRUCTION     Left reconstructive  . BREAST SURGERY  2007   Left lumpectomy  . BREAST SURGERY     Mammosite - right side  . CHOLECYSTECTOMY  1985  . ESOPHAGEAL DILATION  12/19/2015   Procedure: ESOPHAGEAL DILATION;  Surgeon: Rogene Houston, MD;  Location: [facility];  Service: Endoscopy;;  . ESOPHAGOGASTRODUODENOSCOPY N/A 12/19/2015   Procedure: ESOPHAGOGASTRODUODENOSCOPY (EGD);  Surgeon: Rogene Houston, MD;  Location: [facility];  Service: Endoscopy;  Laterality: N/A;  11:40  . SPLENECTOMY, TOTAL  5/32/9924   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  . THYROIDECTOMY, PARTIAL  11/05/2010   Benign disease   Social History   Socioeconomic History  . Marital status: Married    Spouse name: Alroy Dust  . Number of children: 2  . Years of education: Not on file  . Highest education level: Not on file  Occupational History  . Not on file  Tobacco Use  . Smoking status: Current Every Day Smoker    Packs/day: 0.50    Years: 40.00    Pack years: 20.00    Types: Cigarettes  . Smokeless tobacco: Never Used  . Tobacco comment: 1/2 pack a day  Substance and Sexual Activity  . Alcohol use: No    Alcohol/week: 0.0 standard drinks    Comment: Encouraged to quit smoking. She has tried the nicotone gum and patches but did not help  . Drug use: No  . Sexual activity: Yes    Birth control/protection: Surgical  Other Topics Concern   . Not on file  Social History Narrative  . Not on file   Social Determinants of Health   Financial Resource Strain:   . Difficulty of Paying Living Expenses:   Food Insecurity:   . Worried About Charity fundraiser in the Last Year:   . Arboriculturist in the Last Year:   Transportation Needs:   . Film/video editor (Medical):   Marland Kitchen Lack of Transportation (Non-Medical):   Physical Activity:   . Days of Exercise per Week:   . Minutes of Exercise per Session:   Stress:   . Feeling of Stress :   Social Connections:   . Frequency of  Communication with Friends and Family:   . Frequency of Social Gatherings with Friends and Family:   . Attends Religious Services:   . Active Member of Clubs or Organizations:   . Attends Archivist Meetings:   Marland Kitchen Marital Status:   Intimate Partner Violence:   . Fear of Current or Ex-Partner:   . Emotionally Abused:   Marland Kitchen Physically Abused:   . Sexually Abused:    Current Outpatient Medications on File Prior to Visit  Medication Sig Dispense Refill  . aspirin EC 81 MG tablet Take 81 mg by mouth daily.    . Carboxymethylcellulose Sodium (EYE DROPS OP) Apply 1 drop to eye 2 (two) times daily as needed (FOR DRY EYE).    Marland Kitchen esomeprazole (NEXIUM) 40 MG capsule TAKE 1 CAPSULE BY MOUTH EVERY DAY 90 capsule 3  . glipiZIDE (GLUCOTROL XL) 10 MG 24 hr tablet Take two tablets by mouth every morning with breakfast 60 tablet 5  . insulin glargine (LANTUS) 100 UNIT/ML injection Inject 0.5 mLs (50 Units total) into the skin daily. 10 mL 11  . LORazepam (ATIVAN) 1 MG tablet TAKE ONE TABLET BY MOUTH EVERY 8 HOURS AS NEEDED FOR ANXIETY AND/OR NAUSEA 30 tablet 5  . LORazepam (ATIVAN) 1 MG tablet Take one tablet by mouth once daily, as needed, for anxiety and / or nausea 30 tablet 0  . Metoprolol Tartrate 37.5 MG TABS TAKE 1 TABLET BY MOUTH TWICE DAILY (Patient taking differently: Take 37.5 mg by mouth 2 (two) times daily. ) 180 tablet 3  . naproxen (NAPROSYN)  500 MG tablet Take 1 tablet (500 mg total) by mouth 2 (two) times daily with a meal. 40 tablet 0  . ONETOUCH VERIO test strip USE TO TEST UP TO 4 TIMES PER DAY 150 strip 5  . rosuvastatin (CRESTOR) 20 MG tablet TAKE 1 TABLET(20 MG) BY MOUTH DAILY 90 tablet 3  . temazepam (RESTORIL) 15 MG capsule Take 1 capsule (15 mg total) by mouth at bedtime. 30 capsule 4  . traZODone (DESYREL) 100 MG tablet TAKE 1 TABLET(100 MG) BY MOUTH AT BEDTIME 90 tablet 3  . triamterene-hydrochlorothiazide (DYAZIDE) 37.5-25 MG capsule Take 1 each (1 capsule total) by mouth daily. 90 capsule 1  . venlafaxine XR (EFFEXOR-XR) 75 MG 24 hr capsule TAKE 1 CAPSULE(75 MG) BY MOUTH DAILY 90 capsule 1  . Vitamin D, Ergocalciferol, (DRISDOL) 1.25 MG (50000 UNIT) CAPS capsule TAKE 1 CAPSULE BY MOUTH ONCE A WEEK 12 capsule 3   No current facility-administered medications on file prior to visit.   Allergies  Allergen Reactions  . Penicillins Swelling    Has patient had a PCN reaction causing immediate rash, facial/tongue/throat swelling, SOB or lightheadedness with hypotension: Yes Has patient had a PCN reaction causing severe rash involving mucus membranes or skin necrosis: No Has patient had a PCN reaction that required hospitalization: No  Has patient had a PCN reaction occurring within the last 10 years: No If all of the above answers are "NO", then may proceed with Cephalosporin use.   Of face and tongue.  . Metformin And Related Nausea Only    Pt to discontinue med due to intolerance, cramps and diarrhea  . Statins Other (See Comments)    Major concerns re safety  . Tape Itching    Surgical adhesive  . Zetia [Ezetimibe] Other (See Comments)    Feels funny   Family History  Problem Relation Age of Onset  . Diabetes Mother   . Arrhythmia Mother   .  Heart disease Father   . Hyperlipidemia Father   . Arrhythmia Father   . Cancer Paternal Grandmother        Breast  . Diabetes Sister   . Diabetes Brother   . Heart  disease Brother     PE: BP 100/60   Pulse 75   Ht 5' 5.5" (1.664 m)   Wt 221 lb (100.2 kg)   SpO2 96%   BMI 36.22 kg/m  Wt Readings from Last 3 Encounters:  04/17/20 221 lb (100.2 kg)  03/12/20 221 lb 12.8 oz (100.6 kg)  03/11/20 221 lb (100.2 kg)   Constitutional: overweight, in NAD Eyes: PERRLA, EOMI, no exophthalmos ENT: moist mucous membranes, no thyromegaly, no cervical lymphadenopathy Cardiovascular: RRR, No MRG Respiratory: CTA B Gastrointestinal: abdomen soft, NT, ND, BS+ Musculoskeletal: no deformities, strength intact in all 4 Skin: moist, warm, no rashes Neurological: no tremor with outstretched hands, DTR normal in all 4  ASSESSMENT: 1. DM2, insulin-dependent, uncontrolled, without long-term complications, but with hyperglycemia  No family history of medullary thyroid cancer or personal history of pancreatitis.  PLAN:  1. Patient with long-standing, uncontrolled diabetes, on oral antidiabetic regimen with sulfonylurea and also long-acting insulin, which became insufficient.  Latest HbA1c was reviewed from 03/11/2020 and this was higher, at 9.4%. - Of note, she had GI intolerance to Metformin and had UTIs and yeast infections from [place] (per review of the chart). -At this visit, we discussed about the need to improve her diet.  She cannot continue to have only 1 meal a day, but she can have lighter meals otherwise.  Discussed about the benefits of eating fruit and vegetables along with other plant-based foods with the rest of her meals.  We also discussed about fruit and when to eat them. -Otherwise, we discussed about reducing the dose of glipizide and about adding a GLP-1 receptor agonist to help both with fluctuations in the blood sugars, and also with her weight along with improving her cardiovascular risk.  We discussed about benefits and possible side effects from the medication.  She agrees to start this.  We will start at a low dose and increase as tolerated.   We also discussed about switching back from Lantus to [place] since this appeared to be covered for her.  I explained that Tyler Aas is under insulin and Lantus without as much fluctuation in her blood sugars and with a lower risk of hypoglycemia. - I suggested to:  Patient Instructions  Please decrease: - Glipizide XL to 10 mg before breakfast  Please start: - Trulicity 7.41 mg weekly. After 1 month, try to increase to the higher dose (1.5 mg).  Change: - Lantus to Tresiba 50 units at bedtime   Please return in 2 months with your sugar log.   - Strongly advised her to start checking sugars at different times of the day - check 2x a day, rotating checks - discussed about CBG targets for treatment: 80-130 mg/dL before meals and <180 mg/dL after meals; target HbA1c <7%. - given sugar log and advised how to fill it and to bring it at next appt  - given foot care handout and explained the principles  - given instructions for hypoglycemia management "15-15 rule"  - advised for yearly eye exams  - Return to clinic in 2 mo with sugar log   Philemon Kingdom, MD PhD [facility] Endocrinology

## 2020-04-22 ENCOUNTER — Other Ambulatory Visit: Payer: Self-pay | Admitting: Neurosurgery

## 2020-04-22 DIAGNOSIS — M545 Low back pain, unspecified: Secondary | ICD-10-CM

## 2020-04-30 ENCOUNTER — Other Ambulatory Visit: Payer: Self-pay

## 2020-04-30 ENCOUNTER — Inpatient Hospital Stay (HOSPITAL_COMMUNITY): Payer: BC Managed Care – PPO | Attending: Hematology | Admitting: Hematology

## 2020-04-30 VITALS — BP 91/39 | HR 76 | Temp 98.7°F | Resp 18 | Wt 221.5 lb

## 2020-04-30 DIAGNOSIS — Z8349 Family history of other endocrine, nutritional and metabolic diseases: Secondary | ICD-10-CM | POA: Diagnosis not present

## 2020-04-30 DIAGNOSIS — Z853 Personal history of malignant neoplasm of breast: Secondary | ICD-10-CM | POA: Insufficient documentation

## 2020-04-30 DIAGNOSIS — Z8249 Family history of ischemic heart disease and other diseases of the circulatory system: Secondary | ICD-10-CM | POA: Insufficient documentation

## 2020-04-30 DIAGNOSIS — K589 Irritable bowel syndrome without diarrhea: Secondary | ICD-10-CM | POA: Insufficient documentation

## 2020-04-30 DIAGNOSIS — Z7982 Long term (current) use of aspirin: Secondary | ICD-10-CM | POA: Insufficient documentation

## 2020-04-30 DIAGNOSIS — F17210 Nicotine dependence, cigarettes, uncomplicated: Secondary | ICD-10-CM | POA: Insufficient documentation

## 2020-04-30 DIAGNOSIS — D751 Secondary polycythemia: Secondary | ICD-10-CM | POA: Insufficient documentation

## 2020-04-30 DIAGNOSIS — E039 Hypothyroidism, unspecified: Secondary | ICD-10-CM | POA: Insufficient documentation

## 2020-04-30 DIAGNOSIS — K219 Gastro-esophageal reflux disease without esophagitis: Secondary | ICD-10-CM | POA: Insufficient documentation

## 2020-04-30 DIAGNOSIS — Z803 Family history of malignant neoplasm of breast: Secondary | ICD-10-CM | POA: Insufficient documentation

## 2020-04-30 DIAGNOSIS — E119 Type 2 diabetes mellitus without complications: Secondary | ICD-10-CM | POA: Insufficient documentation

## 2020-04-30 DIAGNOSIS — D72829 Elevated white blood cell count, unspecified: Secondary | ICD-10-CM | POA: Diagnosis not present

## 2020-04-30 DIAGNOSIS — Z79899 Other long term (current) drug therapy: Secondary | ICD-10-CM | POA: Diagnosis not present

## 2020-04-30 DIAGNOSIS — Z9081 Acquired absence of spleen: Secondary | ICD-10-CM | POA: Insufficient documentation

## 2020-04-30 DIAGNOSIS — E781 Pure hyperglyceridemia: Secondary | ICD-10-CM | POA: Insufficient documentation

## 2020-04-30 DIAGNOSIS — F329 Major depressive disorder, single episode, unspecified: Secondary | ICD-10-CM | POA: Insufficient documentation

## 2020-04-30 DIAGNOSIS — G4730 Sleep apnea, unspecified: Secondary | ICD-10-CM | POA: Diagnosis not present

## 2020-04-30 DIAGNOSIS — Z833 Family history of diabetes mellitus: Secondary | ICD-10-CM | POA: Insufficient documentation

## 2020-04-30 DIAGNOSIS — Z794 Long term (current) use of insulin: Secondary | ICD-10-CM | POA: Diagnosis not present

## 2020-04-30 NOTE — Patient Instructions (Signed)
[facility] at [facility] Discharge Instructions  You were seen today by Dr. Katragadda. He went over your recent results. Dr. Katragadda will see you back in 6 months for labs and follow up.   Thank you for choosing [facility] at [facility] to provide your oncology and hematology care.  To afford each patient quality time with our provider, please arrive at least 15 minutes before your scheduled appointment time.   If you have a lab appointment with the Cancer Center please come in thru the Main Entrance and check in at the main information desk  You need to re-schedule your appointment should you arrive 10 or more minutes late.  We strive to give you quality time with our providers, and arriving late affects you and other patients whose appointments are after yours.  Also, if you no show three or more times for appointments you may be dismissed from the clinic at the providers discretion.     Again, thank you for choosing [facility].  Our hope is that these requests will decrease the amount of time that you wait before being seen by our physicians.       _____________________________________________________________  Should you have questions after your visit to [facility], please contact our office at [phone] between the hours of 8:00 a.m. and 4:30 p.m.  Voicemails left after 4:00 p.m. will not be returned until the following business day.  For prescription refill requests, have your pharmacy contact our office and allow 72 hours.    Cancer Center Support Programs:   > Cancer Support Group  2nd Tuesday of the month 1pm-2pm, Journey Room    

## 2020-04-30 NOTE — Progress Notes (Signed)
Annette [name], [place] [address]   CLINIC:  Medical Oncology/Hematology  PCP:  Fayrene Helper, MD [address], [place] / [place] [address]  [phone]  REASON FOR VISIT:  Follow-up for leukocytosis  CURRENT THERAPY: None  INTERVAL HISTORY:  Annette [name], a 59 y.o. female, returns for routine follow-up for her leukocytosis. Annette [name] was last seen on 03/12/2020.  She takes a vitamin D supplement once per week.   She notes that when she was younger, she had an issue where her red blood cell count was greater than her white blood cell count and she broke out in a rash.   REVIEW OF SYSTEMS:  Review of Systems  Constitutional: Positive for appetite change (moderately decreased) and fatigue (severe).  HENT:  Negative.   Eyes: Negative.   Respiratory: Positive for shortness of breath.   Cardiovascular: Positive for chest pain.  Gastrointestinal: Positive for constipation and nausea.  Endocrine: Negative.   Genitourinary: Negative.    Musculoskeletal: Negative.   Skin: Negative.   Neurological: Positive for dizziness, headaches and numbness (hands and feet).  Hematological: Negative.   Psychiatric/Behavioral: Negative.   All other systems reviewed and are negative.   PAST MEDICAL/SURGICAL HISTORY:  Past Medical History:  Diagnosis Date  . Breast cancer ([place]) 2007   History of  XRT, onTamoxifen  . Bruises easily   . Depression   . Diabetes mellitus    prediabetic  . GERD (gastroesophageal reflux disease)   . Hypertriglyceridemia   . Hypothyroidism    following chemo amnd radiaition for breast cancer, needed replacement short term  . Hypothyroidism (acquired)    replaced x 1 year  . IBS (irritable bowel syndrome)   . Menieres disease    Controlled with triamterene   . Migraines   . Obesity   . Palpitations   . 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[place])   . Sleep apnea 2010   Problems with CPAP   Past Surgical History:   Procedure Laterality Date  . ABDOMINAL HYSTERECTOMY  1998   Benign, fibroids  . 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 APPENDECTOMY  06/12/08  . BREAST RECONSTRUCTION     Left reconstructive  . BREAST SURGERY  2007   Left lumpectomy  . BREAST SURGERY     Mammosite - right side  . CHOLECYSTECTOMY  1985  . ESOPHAGEAL DILATION  12/19/2015   Procedure: ESOPHAGEAL DILATION;  Surgeon: Rogene Houston, MD;  Location: [facility];  Service: Endoscopy;;  . ESOPHAGOGASTRODUODENOSCOPY N/A 12/19/2015   Procedure: ESOPHAGOGASTRODUODENOSCOPY (EGD);  Surgeon: Rogene Houston, MD;  Location: [facility];  Service: Endoscopy;  Laterality: N/A;  11:40  . SPLENECTOMY, TOTAL  4/00/8676   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  . THYROIDECTOMY, PARTIAL  11/05/2010   Benign disease    SOCIAL HISTORY:  Social History   Socioeconomic History  . Marital status: Married    Spouse name: Alroy Dust  . Number of children: 2  . Years of education: Not on file  . Highest education level: Not on file  Occupational History  . Not on file  Tobacco Use  . Smoking status: Current Every Day Smoker    Packs/day: 0.50    Years: 40.00    Pack years: 20.00    Types: Cigarettes  . Smokeless tobacco: Never Used  . Tobacco comment: 1/2 pack a day  Substance and Sexual Activity  . Alcohol  use: No    Alcohol/week: 0.0 standard drinks    Comment: Encouraged to quit smoking. She has tried the nicotone gum and patches but did not help  . Drug use: No  . Sexual activity: Yes    Birth control/protection: Surgical  Other Topics Concern  . Not on file  Social History Narrative  . Not on file   Social Determinants of Health   Financial Resource Strain:   . Difficulty of Paying Living Expenses:   Food Insecurity:   . Worried About Charity fundraiser in the Last Year:   . Arboriculturist in the Last Year:   Transportation Needs:   . Film/video editor  (Medical):   Marland Kitchen Lack of Transportation (Non-Medical):   Physical Activity:   . Days of Exercise per Week:   . Minutes of Exercise per Session:   Stress:   . Feeling of Stress :   Social Connections:   . Frequency of Communication with Friends and Family:   . Frequency of Social Gatherings with Friends and Family:   . Attends Religious Services:   . Active Member of Clubs or Organizations:   . Attends Archivist Meetings:   Marland Kitchen Marital Status:   Intimate Partner Violence:   . Fear of Current or Ex-Partner:   . Emotionally Abused:   Marland Kitchen Physically Abused:   . Sexually Abused:     FAMILY HISTORY:  Family History  Problem Relation Age of Onset  . Diabetes Mother   . Arrhythmia Mother   . Heart disease Father   . Hyperlipidemia Father   . Arrhythmia Father   . Cancer Paternal Grandmother        Breast  . Diabetes Sister   . Diabetes Brother   . Heart disease Brother     CURRENT MEDICATIONS:  Current Outpatient Medications  Medication Sig Dispense Refill  . aspirin EC 81 MG tablet Take 81 mg by mouth daily.    . Dulaglutide (TRULICITY) 3.54 SF/6.8LE SOPN Inject 0.75 mg in am weekly under skin 4 pen 2  . Dulaglutide (TRULICITY) 1.5 XN/1.7GY SOPN Inject 0.5 mLs (1.5 mg total) into the skin once a week. 4 pen 11  . esomeprazole (NEXIUM) 40 MG capsule TAKE 1 CAPSULE BY MOUTH EVERY DAY 90 capsule 3  . glipiZIDE (GLUCOTROL XL) 10 MG 24 hr tablet Take two tablets by mouth every morning with breakfast 60 tablet 5  . insulin degludec (TRESIBA FLEXTOUCH) 200 UNIT/ML FlexTouch Pen Inject 50 Units into the skin daily. 6 pen 3  . Insulin Pen Needle 32G X 4 MM MISC Use 1x a day 100 each 3  . Metoprolol Tartrate 37.5 MG TABS TAKE 1 TABLET BY MOUTH TWICE DAILY (Patient taking differently: Take 37.5 mg by mouth 2 (two) times daily. ) 180 tablet 3  . naproxen (NAPROSYN) 500 MG tablet Take 1 tablet (500 mg total) by mouth 2 (two) times daily with a meal. 40 tablet 0  . ONETOUCH VERIO test  strip USE TO TEST UP TO 4 TIMES PER DAY 150 strip 5  . rosuvastatin (CRESTOR) 20 MG tablet TAKE 1 TABLET(20 MG) BY MOUTH DAILY 90 tablet 3  . temazepam (RESTORIL) 15 MG capsule Take 1 capsule (15 mg total) by mouth at bedtime. 30 capsule 4  . traZODone (DESYREL) 100 MG tablet TAKE 1 TABLET(100 MG) BY MOUTH AT BEDTIME 90 tablet 3  . triamterene-hydrochlorothiazide (DYAZIDE) 37.5-25 MG capsule Take 1 each (1 capsule total) by mouth daily.  90 capsule 1  . venlafaxine XR (EFFEXOR-XR) 75 MG 24 hr capsule TAKE 1 CAPSULE(75 MG) BY MOUTH DAILY 90 capsule 1  . Vitamin D, Ergocalciferol, (DRISDOL) 1.25 MG (50000 UNIT) CAPS capsule TAKE 1 CAPSULE BY MOUTH ONCE A WEEK 12 capsule 3  . Carboxymethylcellulose Sodium (EYE DROPS OP) Apply 1 drop to eye 2 (two) times daily as needed (FOR DRY EYE). (Patient not taking: Reported on 04/30/2020)    . LORazepam (ATIVAN) 1 MG tablet TAKE ONE TABLET BY MOUTH EVERY 8 HOURS AS NEEDED FOR ANXIETY AND/OR NAUSEA (Patient not taking: Reported on 04/30/2020) 30 tablet 5   No current facility-administered medications for this visit.    ALLERGIES:  Allergies  Allergen Reactions  . Penicillins Swelling    Has patient had a PCN reaction causing immediate rash, facial/tongue/throat swelling, SOB or lightheadedness with hypotension: Yes Has patient had a PCN reaction causing severe rash involving mucus membranes or skin necrosis: No Has patient had a PCN reaction that required hospitalization: No  Has patient had a PCN reaction occurring within the last 10 years: No If all of the above answers are "NO", then may proceed with Cephalosporin use.   Of face and tongue.  . Metformin And Related Nausea Only    Pt to discontinue med due to intolerance, cramps and diarrhea  . Statins Other (See Comments)    Major concerns re safety  . Tape Itching    Surgical adhesive  . Zetia [Ezetimibe] Other (See Comments)    Feels funny    PHYSICAL EXAM:  Performance status (ECOG): 1 -  Symptomatic but completely ambulatory  Vitals:   04/30/20 1552 04/30/20 1553  BP: (!) 86/32 (!) 91/39  Pulse: 76   Resp: 18   Temp: 98.7 F (37.1 C)   SpO2: 94%    Wt Readings from Last 3 Encounters:  04/30/20 (!) 221 lb 8 oz (100.5 kg)  04/17/20 221 lb (100.2 kg)  03/12/20 221 lb 12.8 oz (100.6 kg)   Physical Exam Vitals and nursing note reviewed.  Constitutional:      Appearance: Normal appearance.  HENT:     Mouth/Throat:     Mouth: Mucous membranes are moist.  Eyes:     Pupils: Pupils are equal, round, and reactive to light.  Cardiovascular:     Rate and Rhythm: Normal rate and regular rhythm.     Pulses: Normal pulses.     Heart sounds: Normal heart sounds.  Pulmonary:     Effort: Pulmonary effort is normal.     Breath sounds: Normal breath sounds.  Abdominal:     Palpations: Abdomen is soft. There is no mass.     Tenderness: There is no abdominal tenderness.  Musculoskeletal:     Cervical back: Neck supple.     Right lower leg: No edema.     Left lower leg: No edema.  Neurological:     Mental Status: She is alert and oriented to person, place, and time.  Psychiatric:        Mood and Affect: Mood normal.        Behavior: Behavior normal.     LABORATORY DATA:  I have reviewed the labs as listed.  CBC Latest Ref Rng & Units 03/12/2020 02/20/2020 11/01/2019  WBC 4.0 - 10.5 K/uL 10.5 12.3(H) 9.1  Hemoglobin 12.0 - 15.0 g/dL 16.3(H) 17.6(H) 16.4(H)  Hematocrit 36 - 46 % 49.3(H) 51.8(H) 49.3(H)  Platelets 150 - 400 K/uL 316 363 311   CMP Latest Ref  Rng & Units 03/12/2020 02/20/2020 11/01/2019  Glucose 70 - 99 mg/dL 195(H) 183(H) 178(H)  BUN 6 - 20 mg/dL 12 15 13   Creatinine 0.44 - 1.00 mg/dL 0.87 1.07(H) 0.95  Sodium 135 - 145 mmol/L 139 142 139  Potassium 3.5 - 5.1 mmol/L 4.2 4.4 4.1  Chloride 98 - 111 mmol/L 103 100 105  CO2 22 - 32 mmol/L 27 25 23   Calcium 8.9 - 10.3 mg/dL 9.8 9.9 9.2  Total Protein 6.5 - 8.1 g/dL 7.2 7.1 7.4  Total Bilirubin 0.3 - 1.2 mg/dL  0.7 0.5 0.6  Alkaline Phos 38 - 126 U/L 77 95 86  AST 15 - 41 U/L 18 26 27   ALT 0 - 44 U/L 21 22 27       Component Value Date/Time   RBC 4.93 03/12/2020 1456   MCV 100.0 03/12/2020 1456   MCV 99 (H) 02/20/2020 1157   MCH 33.1 03/12/2020 1456   MCHC 33.1 03/12/2020 1456   RDW 14.0 03/12/2020 1456   RDW 12.9 02/20/2020 1157   LYMPHSABS 4.7 (H) 03/12/2020 1456   LYMPHSABS 4.9 (H) 02/20/2020 1157   MONOABS 0.9 03/12/2020 1456   EOSABS 0.3 03/12/2020 1456   EOSABS 0.3 02/20/2020 1157   BASOSABS 0.1 03/12/2020 1456   BASOSABS 0.1 02/20/2020 1157    DIAGNOSTIC IMAGING:  I have independently reviewed the scans and discussed with the patient. No results found.    ASSESSMENT:  1.  Leukocytosis: -CBC on 02/20/2020 shows white count 12.3.  She also had elevated hemoglobin of 17.6 and hematocrit of 51.8.  Differential showed elevated lymphocytes and monocytes. -She had intermittent leukocytosis for the past few years. 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 -She does not have any B symptoms.  No steroid use.  No chronic infections. -She is a current active smoker, 1 and half pack per day since age 50. -Flow cytometry was negative.  JAK2 V617F reflex testing and BCR/ABL was negative.  2.  Stage I left breast cancer, ER positive, HER-2 positive: -Diagnosed on 12/10/2005, status post lumpectomy. -Taxotere and Cytoxan plus Herceptin for 6 cycles followed by 1 year of Herceptin. -5 years of tamoxifen completed in November 2012.   PLAN:  1.  Leukocytosis: -We reviewed results.  JAK2 V617F and reflex testing was negative.  BCR/ABL was negative.  Flow cytometry did not show any abnormalities. -CBC showed white count 10.5 with elevated lymphocytes. -Most likely etiology is splenectomy and smoking. -We will see her back in 6 months with repeat labs.  2.  Erythrocytosis: -Hemoglobin was elevated at 16.3 and hematocrit 49.3.  JAK2 testing was  negative. -Most likely related to smoking.  3.  Left breast cancer: -Mammogram on 09/14/2019 was BI-RADS Category 1.  Orders placed this encounter:  No orders of the defined types were placed in this encounter.    Derek Jack, MD [facility] [phone]   I, Jacqualyn Posey, am acting as a scribe for Dr. Sanda Linger.  I, Derek Jack MD, have reviewed the above documentation for accuracy and completeness, and I agree with the above.

## 2020-05-05 ENCOUNTER — Other Ambulatory Visit: Payer: Self-pay | Admitting: Family Medicine

## 2020-05-05 ENCOUNTER — Encounter: Payer: Self-pay | Admitting: Family Medicine

## 2020-05-05 MED ORDER — TEMAZEPAM 15 MG PO CAPS: 15.0000 mg | ORAL_CAPSULE | Freq: Every day | ORAL | 4 refills | Status: DC

## 2020-05-24 ENCOUNTER — Ambulatory Visit
Admission: RE | Admit: 2020-05-24 | Discharge: 2020-05-24 | Disposition: A | Discharge status: Home / Self Care | Payer: BC Managed Care – PPO | Source: Ambulatory Visit | Attending: Neurosurgery | Admitting: Neurosurgery

## 2020-05-24 ENCOUNTER — Other Ambulatory Visit: Payer: Self-pay

## 2020-05-24 DIAGNOSIS — M545 Low back pain, unspecified: Secondary | ICD-10-CM

## 2020-05-26 ENCOUNTER — Encounter: Payer: Self-pay | Admitting: Internal Medicine

## 2020-05-27 MED ORDER — DEXCOM G6 TRANSMITTER MISC: 0 refills | Status: DC

## 2020-05-27 MED ORDER — DEXCOM G6 RECEIVER DEVI: 3 refills | Status: DC

## 2020-05-27 MED ORDER — DEXCOM G6 SENSOR MISC: 6 refills | Status: DC

## 2020-05-29 ENCOUNTER — Encounter: Payer: Self-pay | Admitting: Family Medicine

## 2020-05-29 ENCOUNTER — Telehealth: Payer: Self-pay | Admitting: Internal Medicine

## 2020-05-29 ENCOUNTER — Other Ambulatory Visit: Payer: Self-pay

## 2020-05-29 ENCOUNTER — Ambulatory Visit (INDEPENDENT_AMBULATORY_CARE_PROVIDER_SITE_OTHER): Payer: BC Managed Care – PPO | Admitting: Family Medicine

## 2020-05-29 VITALS — BP 92/62 | HR 80 | Resp 16 | Ht 65.0 in | Wt 220.0 lb

## 2020-05-29 DIAGNOSIS — E785 Hyperlipidemia, unspecified: Secondary | ICD-10-CM

## 2020-05-29 DIAGNOSIS — E1169 Type 2 diabetes mellitus with other specified complication: Secondary | ICD-10-CM | POA: Diagnosis not present

## 2020-05-29 DIAGNOSIS — Z23 Encounter for immunization: Secondary | ICD-10-CM | POA: Diagnosis not present

## 2020-05-29 DIAGNOSIS — F411 Generalized anxiety disorder: Secondary | ICD-10-CM

## 2020-05-29 DIAGNOSIS — E278 Other specified disorders of adrenal gland: Secondary | ICD-10-CM

## 2020-05-29 DIAGNOSIS — F17218 Nicotine dependence, cigarettes, with other nicotine-induced disorders: Secondary | ICD-10-CM | POA: Diagnosis not present

## 2020-05-29 DIAGNOSIS — Z794 Long term (current) use of insulin: Secondary | ICD-10-CM

## 2020-05-29 DIAGNOSIS — K219 Gastro-esophageal reflux disease without esophagitis: Secondary | ICD-10-CM

## 2020-05-29 NOTE — Patient Instructions (Addendum)
Annual physical exam with MD end January, call if you need me sooner  Please schedule mammogram at checkout   Please reduce cigarettes every 2 weeks, by one less if able , so start at 9/day this week  Please follow through and get colonoscopy this year  It is important that you exercise regularly at least 30 minutes 5 times a week. If you develop chest pain, have severe difficulty breathing, or feel very tired, stop exercising immediately and seek medical attention   Thanks for choosing [facility], we consider it a privelige to serve you.

## 2020-05-29 NOTE — Telephone Encounter (Signed)
I had asked the patient to check with her insurance about which CGM is covered AND what the criteria for getting one is that she needs to meet.  Patient did not ask about the criteria. I have sent her a message letting her know to call and ask them what their approval criteria is because if she does not meet that criteria, it will NOT be approved for coverage. I can not proceed with doing a PA until the patient has provided me with this information.

## 2020-05-29 NOTE — Telephone Encounter (Signed)
[employer] called to advise that a Pre Cert for the Dex Com G6 would need to be done through the [facility] line [phone]

## 2020-05-30 ENCOUNTER — Encounter: Payer: Self-pay | Admitting: Family Medicine

## 2020-05-30 DIAGNOSIS — E278 Other specified disorders of adrenal gland: Secondary | ICD-10-CM | POA: Insufficient documentation

## 2020-05-30 NOTE — Assessment & Plan Note (Signed)
Hyperlipidemia:Low fat diet discussed and encouraged.   Lipid Panel  Lab Results  Component Value Date   CHOL 137 02/20/2020   HDL 41 02/20/2020   LDLCALC 75 02/20/2020   TRIG 114 02/20/2020   CHOLHDL 3.1 11/21/2018   Controlled, no change in medication

## 2020-05-30 NOTE — Assessment & Plan Note (Signed)
Increased stress due to finances, however, coping well overall. No med chnage

## 2020-05-30 NOTE — Assessment & Plan Note (Signed)
Asked:confirms currently smokes cigarettes Approx 10/day Assess: Unwilling to set a quit date, but is cutting back Advise: needs to QUIT to reduce risk of cancer, cardio and cerebrovascular disease Assist: counseled for 5 minutes and literature provided Arrange: follow up in 2 to 4 months

## 2020-05-30 NOTE — Assessment & Plan Note (Signed)
  Patient re-educated about  the importance of commitment to a  minimum of 150 minutes of exercise per week as able.  The importance of healthy food choices with portion control discussed, as well as eating regularly and within a 12 hour window most days. The need to choose "clean , green" food 50 to 75% of the time is discussed, as well as to make water the primary drink and set a goal of 64 ounces water daily.    Weight /BMI 05/29/2020 04/30/2020 04/17/2020  WEIGHT 220 lb 221 lb 8 oz 221 lb  HEIGHT 5\' 5"  - 5' 5.5"  BMI 36.61 kg/m2 36.3 kg/m2 36.22 kg/m2

## 2020-05-30 NOTE — Assessment & Plan Note (Signed)
Annette [name] is reminded of the importance of commitment to daily physical activity for 30 minutes or more, as able and the need to limit carbohydrate intake to 30 to 60 grams per meal to help with blood sugar control.   The need to take medication as prescribed, test blood sugar as directed, and to call between visits if there is a concern that blood sugar is uncontrolled is also discussed.   Annette [name] is reminded of the importance of daily foot exam, annual eye examination, and good blood sugar, blood pressure and cholesterol control. Reports improvement in numbers, ahs Sept follow up with Endo  Diabetic Labs Latest Ref Rng & Units 03/12/2020 03/11/2020 02/20/2020 11/01/2019 08/27/2019  HbA1c 4.0 - 5.6 % - 9.4(A) - - -  Microalbumin Not Estab. ug/mL - - - - -  Micro/Creat Ratio 0.0 - 30.0 mg/g creat - - - - -  Chol 100 - 199 mg/dL - - 137 - -  HDL >39 mg/dL - - 41 - -  Calc LDL 0 - 99 mg/dL - - 75 - -  Triglycerides 0 - 149 mg/dL - - 114 - -  Creatinine 0.44 - 1.00 mg/dL 0.87 - 1.07(H) 0.95 0.91   BP/Weight 05/29/2020 04/30/2020 04/17/2020 03/12/2020 03/11/2020 11/06/2019 12/21/2332  Systolic BP 92 91 356 861 683 729 021  Diastolic BP 62 39 60 55 70 63 63  Wt. (Lbs) 220 221.5 221 221.8 221 228 228  BMI 36.61 36.3 36.22 36.35 35.67 36.8 36.8   Foot/eye exam completion dates 08/23/2019 07/15/2014  Foot Form Completion Done Done

## 2020-05-30 NOTE — Assessment & Plan Note (Signed)
Controlled, no change in medication  

## 2020-05-30 NOTE — Progress Notes (Signed)
Annette [name]     MRN: [id number]      DOB: 1961/02/27   HPI Annette [name] is here for follow up and re-evaluation of chronic medical conditions, medication management and review of any available recent lab and radiology data.  Preventive health is updated, specifically  Cancer screening and Immunization.   Questions or concerns regarding consultations or procedures which the PT has had in the interim are  addressed. The PT denies any adverse reactions to current medications since the last visit.  C/o increased financial stress as single income and need for home repairs Blood sugar much improved and she feels better Denies polyuria, polydipsia, blurred vision , or hypoglycemic episodes.   ROS Denies recent fever or chills. Denies sinus pressure, nasal congestion, ear pain or sore throat. Denies chest congestion, productive cough or wheezing. Denies chest pains, palpitations and leg swelling Denies abdominal pain, nausea, vomiting,diarrhea or constipation.   Denies dysuria, frequency, hesitancy or incontinence. Denies joint pain, swelling and limitation in mobility. Denies headaches, seizures, numbness, or tingling. Denies skin break down or rash.   PE  BP 92/62   Pulse 80   Resp 16   Ht 5\' 5"  (1.651 m)   Wt 220 lb (99.8 kg)   SpO2 93%   BMI 36.61 kg/m   Patient alert and oriented and in no cardiopulmonary distress.  HEENT: No facial asymmetry, EOMI,     Neck supple .  Chest: Clear to auscultation bilaterally.decreased air entry  CVS: S1, S2 no murmurs, no S3.Regular rate.  ABD: Soft non tender.   Ext: No edema  MS: Adequate ROM spine, shoulders, hips and knees.  Skin: Intact, no ulcerations or rash noted.  Psych: Good eye contact, normal affect. Memory intact mildly anxious not  depressed appearing.  CNS: CN 2-12 intact, power,  normal throughout.no focal deficits noted.   Assessment & Plan  Hyperlipidemia LDL goal <100 Hyperlipidemia:Low fat diet  discussed and encouraged.   Lipid Panel  Lab Results  Component Value Date   CHOL 137 02/20/2020   HDL 41 02/20/2020   LDLCALC 75 02/20/2020   TRIG 114 02/20/2020   CHOLHDL 3.1 11/21/2018   Controlled, no change in medication     GERD (gastroesophageal reflux disease) Controlled, no change in medication   Type 2 diabetes mellitus with other specified complication ([place]) Annette [name] is reminded of the importance of commitment to daily physical activity for 30 minutes or more, as able and the need to limit carbohydrate intake to 30 to 60 grams per meal to help with blood sugar control.   The need to take medication as prescribed, test blood sugar as directed, and to call between visits if there is a concern that blood sugar is uncontrolled is also discussed.   Annette [name] is reminded of the importance of daily foot exam, annual eye examination, and good blood sugar, blood pressure and cholesterol control. Reports improvement in numbers, ahs Sept follow up with Endo  Diabetic Labs Latest Ref Rng & Units 03/12/2020 03/11/2020 02/20/2020 11/01/2019 08/27/2019  HbA1c 4.0 - 5.6 % - 9.4(A) - - -  Microalbumin Not Estab. ug/mL - - - - -  Micro/Creat Ratio 0.0 - 30.0 mg/g creat - - - - -  Chol 100 - 199 mg/dL - - 137 - -  HDL >39 mg/dL - - 41 - -  Calc LDL 0 - 99 mg/dL - - 75 - -  Triglycerides 0 - 149 mg/dL - - 114 - -  Creatinine 0.44 - 1.00 mg/dL 0.87 - 1.07(H) 0.95 0.91   BP/Weight 05/29/2020 04/30/2020 04/17/2020 03/12/2020 03/11/2020 11/06/2019 12/28/7122  Systolic BP 92 91 580 998 338 250 539  Diastolic BP 62 39 60 55 70 63 63  Wt. (Lbs) 220 221.5 221 221.8 221 228 228  BMI 36.61 36.3 36.22 36.35 35.67 36.8 36.8   Foot/eye exam completion dates 08/23/2019 07/15/2014  Foot Form Completion Done Done        GAD (generalized anxiety disorder) Increased stress due to finances, however, coping well overall. No med chnage  Morbid obesity [facility])  Patient re-educated about  the  importance of commitment to a  minimum of 150 minutes of exercise per week as able.  The importance of healthy food choices with portion control discussed, as well as eating regularly and within a 12 hour window most days. The need to choose "clean , green" food 50 to 75% of the time is discussed, as well as to make water the primary drink and set a goal of 64 ounces water daily.    Weight /BMI 05/29/2020 04/30/2020 04/17/2020  WEIGHT 220 lb 221 lb 8 oz 221 lb  HEIGHT 5\' 5"  - 5' 5.5"  BMI 36.61 kg/m2 36.3 kg/m2 36.22 kg/m2      Nicotine dependence Asked:confirms currently smokes cigarettes Approx 10/day Assess: Unwilling to set a quit date, but is cutting back Advise: needs to QUIT to reduce risk of cancer, cardio and cerebrovascular disease Assist: counseled for 5 minutes and literature provided Arrange: follow up in 2 to 4 months

## 2020-06-05 ENCOUNTER — Other Ambulatory Visit (INDEPENDENT_AMBULATORY_CARE_PROVIDER_SITE_OTHER): Payer: Self-pay | Admitting: *Deleted

## 2020-06-05 ENCOUNTER — Other Ambulatory Visit: Payer: Self-pay

## 2020-06-05 ENCOUNTER — Ambulatory Visit (INDEPENDENT_AMBULATORY_CARE_PROVIDER_SITE_OTHER): Payer: BC Managed Care – PPO | Admitting: Gastroenterology

## 2020-06-05 ENCOUNTER — Encounter (INDEPENDENT_AMBULATORY_CARE_PROVIDER_SITE_OTHER): Payer: Self-pay | Admitting: Gastroenterology

## 2020-06-05 VITALS — BP 90/51 | HR 84 | Temp 99.2°F | Ht 65.0 in | Wt 220.5 lb

## 2020-06-05 DIAGNOSIS — Z01810 Encounter for preprocedural cardiovascular examination: Secondary | ICD-10-CM | POA: Diagnosis not present

## 2020-06-05 DIAGNOSIS — K582 Mixed irritable bowel syndrome: Secondary | ICD-10-CM

## 2020-06-05 NOTE — Progress Notes (Signed)
Annette [name], M.D. Gastroenterology & Hepatology [facility] [address] Annette [name], [place] [address] Primary Care Physician: Annette Helper, MD [address] [place] [address]  Referring MD: Annette Nakayama, MD  I will communicate my assessment and recommendations to the referring MD via EMR. Note: Occasional unusual wording and randomly placed punctuation marks may result from the use of speech recognition technology to transcribe this document"  Chief Complaint: Intermittent constipation and diarrhea  History of Present Illness: Annette [name] is a 59 y.o. female with past medical history of depression, diabetes, GERD, hyperlipidemia, hypothyroidism, IBS -M, obesity, OSA, history of breast cancer on remission, who presents for evaluation of intermittent episodes of constipation and diarrhea.  Patient reports that she has had a longstanding history of alternating bowel movements.  Due to this, the patient has been seen in the gastroenterology clinic several times in the past, with her predominant symptom being bloating.  She states that 60% of the times she has watery bowel movements, 2-3 times per day without any blood.  She is reports that occasionally her bowel movements have normal consistency, but occasionally she also has episodes of constipation that can last up to 1 week.  She does not take any laxatives.  Notably, she reports that since she started Trulicity she has noticed her symptoms have been happening more frequently.  Patient also reports a longstanding history of episodes of lower abdominal pain described as a pressure, intermittent in nature and usually resolving after the patient has bowel movements.  She also endorses having episodes of bloating that improve as well when she has a bowel movement.  Denies having any vomiting but she feels constantly nauseated.  Has not changed her diet in the past but  has not noticed any unintentional weight loss.   Last cross-sectional abdominal imaging was performed 2015 where she was found to have no acute alterations.  She has status post left adrenalectomy and splenectomy.  The patient denies having any fever, chills, hematochezia, melena, hematemesis, jaundice, pruritus.  Last EGD: 2017, normal esophagus but empiric dilation was performed, a 2 cm hiatal hernia was observed, no other alterations. Last Colonoscopy:2010, she believes she had polyps at that time.  Past Medical History: Past Medical History:  Diagnosis Date  . Breast cancer ([place]) 2007   History of  XRT, onTamoxifen  . Bruises easily   . Depression   . Diabetes mellitus    prediabetic  . GERD (gastroesophageal reflux disease)   . Hypertriglyceridemia   . Hypothyroidism    following chemo amnd radiaition for breast cancer, needed replacement short term  . Hypothyroidism (acquired)    replaced x 1 year  . IBS (irritable bowel syndrome)   . Menieres disease    Controlled with triamterene   . Migraines   . Obesity   . Palpitations   . 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[place])   . Sleep apnea 2010   Problems with CPAP    Past Surgical History: Past Surgical History:  Procedure Laterality Date  . ABDOMINAL HYSTERECTOMY  1998   Benign, fibroids  . 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 APPENDECTOMY  06/12/08  . BREAST RECONSTRUCTION     Left reconstructive  . BREAST SURGERY  2007   Left lumpectomy  . BREAST SURGERY     Mammosite - right side  . CHOLECYSTECTOMY  1985  . ESOPHAGEAL DILATION  12/19/2015   Procedure: ESOPHAGEAL DILATION;  Surgeon: Rogene Houston, MD;  [facility];  Service: Endoscopy;;  . ESOPHAGOGASTRODUODENOSCOPY N/A 12/19/2015   Procedure: ESOPHAGOGASTRODUODENOSCOPY (EGD);  Surgeon: Rogene Houston, MD;  [facility];  Service: Endoscopy;  Laterality: N/A;  11:40  . SPLENECTOMY, TOTAL  06/04/5175    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  . THYROIDECTOMY, PARTIAL  11/05/2010   Benign disease    Family History: Family History  Problem Relation Age of Onset  . Diabetes Mother   . Arrhythmia Mother   . Heart disease Father   . Hyperlipidemia Father   . Arrhythmia Father   . Cancer Paternal Grandmother        Breast  . Diabetes Sister   . Diabetes Brother   . Heart disease Brother     Social History: Social History   Tobacco Use  Smoking Status Current Every Day Smoker  . Packs/day: 0.50  . Years: 40.00  . Pack years: 20.00  . Types: Cigarettes  Smokeless Tobacco Never Used  Tobacco Comment   1/2 pack a day   Social History   Substance and Sexual Activity  Alcohol Use No  . Alcohol/week: 0.0 standard drinks   Comment: Encouraged to quit smoking. She has tried the nicotone gum and patches but did not help   Social History   Substance and Sexual Activity  Drug Use No    Allergies: Allergies  Allergen Reactions  . Penicillins Swelling    Has patient had a PCN reaction causing immediate rash, facial/tongue/throat swelling, SOB or lightheadedness with hypotension: Yes Has patient had a PCN reaction causing severe rash involving mucus membranes or skin necrosis: No Has patient had a PCN reaction that required hospitalization: No  Has patient had a PCN reaction occurring within the last 10 years: No If all of the above answers are "NO", then may proceed with Cephalosporin use.   Of face and tongue.  . Metformin And Related Nausea Only    Pt to discontinue med due to intolerance, cramps and diarrhea  . Statins Other (See Comments)    Major concerns re safety  . Tape Itching    Surgical adhesive  . Zetia [Ezetimibe] Other (See Comments)    Feels funny    Medications: Current Outpatient Medications  Medication Sig Dispense Refill  . aspirin EC 81 MG tablet Take 81 mg by mouth daily.    . Dulaglutide (TRULICITY) 1.60 VP/7.1GG SOPN Inject 0.75  mg in am weekly under skin 4 pen 2  . esomeprazole (NEXIUM) 40 MG capsule TAKE 1 CAPSULE BY MOUTH EVERY DAY 90 capsule 3  . glipiZIDE (GLUCOTROL XL) 10 MG 24 hr tablet Take two tablets by mouth every morning with breakfast (Patient taking differently: Take 10 mg by mouth daily with breakfast. Take two tablets by mouth every morning with breakfast) 60 tablet 5  . insulin degludec (TRESIBA FLEXTOUCH) 200 UNIT/ML FlexTouch Pen Inject 50 Units into the skin daily. 6 pen 3  . Insulin Pen Needle 32G X 4 MM MISC Use 1x a day 100 each 3  . LORazepam (ATIVAN) 1 MG tablet TAKE ONE TABLET BY MOUTH EVERY 8 HOURS AS NEEDED FOR ANXIETY AND/OR NAUSEA 30 tablet 5  . Metoprolol Tartrate 37.5 MG TABS TAKE 1 TABLET BY MOUTH TWICE DAILY (Patient taking differently: Take 37.5 mg by mouth 2 (two) times daily. ) 180 tablet 3  . naproxen (NAPROSYN) 500 MG tablet Take 1 tablet (500 mg total) by mouth 2 (two) times daily with  a meal. 40 tablet 0  . ONETOUCH VERIO test strip USE TO TEST UP TO 4 TIMES PER DAY 150 strip 5  . rosuvastatin (CRESTOR) 20 MG tablet TAKE 1 TABLET(20 MG) BY MOUTH DAILY 90 tablet 3  . temazepam (RESTORIL) 15 MG capsule Take 1 capsule (15 mg total) by mouth at bedtime. 30 capsule 4  . traZODone (DESYREL) 100 MG tablet TAKE 1 TABLET(100 MG) BY MOUTH AT BEDTIME 90 tablet 3  . triamterene-hydrochlorothiazide (DYAZIDE) 37.5-25 MG capsule Take 1 each (1 capsule total) by mouth daily. 90 capsule 1  . venlafaxine XR (EFFEXOR-XR) 75 MG 24 hr capsule TAKE 1 CAPSULE(75 MG) BY MOUTH DAILY 90 capsule 1  . Vitamin D, Ergocalciferol, (DRISDOL) 1.25 MG (50000 UNIT) CAPS capsule TAKE 1 CAPSULE BY MOUTH ONCE A WEEK 12 capsule 3  . Continuous Blood Gluc Receiver ([place]) DEVI For continuous blood glucose monitoring. (Patient not taking: Reported on 06/05/2020) 1 each 3  . Continuous Blood Gluc Sensor (DEXCOM G6 SENSOR) MISC For continuous blood glucose monitoring. Apply new sensor every 10 days. (Patient not  taking: Reported on 06/05/2020) 3 each 6  . Continuous Blood Gluc Transmit (DEXCOM G6 TRANSMITTER) MISC For continuous blood glucose monitoring. (Patient not taking: Reported on 06/05/2020) 1 each 0  . Dulaglutide (TRULICITY) 1.5 HG/9.9ME SOPN Inject 0.5 mLs (1.5 mg total) into the skin once a week. (Patient not taking: Reported on 06/05/2020) 4 pen 11   No current facility-administered medications for this visit.    Review of Systems: GENERAL: negative for malaise, night sweats HEENT: No changes in hearing or vision, no nose bleeds or other nasal problems. NECK: Negative for lumps, goiter, pain and significant neck swelling RESPIRATORY: Negative for cough, wheezing CARDIOVASCULAR: Negative for chest pain, leg swelling, palpitations, orthopnea GI: SEE HPI MUSCULOSKELETAL: Negative for joint pain or swelling, back pain, and muscle pain. SKIN: Negative for lesions, rash PSYCH: Negative for sleep disturbance, mood disorder and recent psychosocial stressors. HEMATOLOGY Negative for prolonged bleeding, bruising easily, and swollen nodes. ENDOCRINE: Negative for cold or heat intolerance, polyuria, polydipsia and goiter. NEURO: negative for tremor, gait imbalance, syncope and seizures. The remainder of the review of systems is noncontributory.   Physical Exam: BP (!) 90/51 (BP Location: Right Arm, Patient Position: Sitting, Cuff Size: Large)   Pulse 84   Temp 99.2 F (37.3 C) (Oral)   Ht 5\' 5"  (1.651 m)   Wt 220 lb 8 oz (100 kg)   BMI 36.69 kg/m  GENERAL: The patient is AO x3, in no acute distress.  Obese. HEENT: Head is normocephalic and atraumatic. EOMI are intact. Mouth is well hydrated and without lesions. NECK: Supple. No masses LUNGS: Clear to auscultation. No presence of rhonchi/wheezing/rales. Adequate chest expansion HEART: RRR, normal s1 and s2. ABDOMEN: Mildly tender to palpation diffusely in her abdomen with sparing of the right upper quadrant, but worse on the left side of the  abdomen, no guarding, no peritoneal signs, and nondistended. BS +. No masses. EXTREMITIES: Without any cyanosis, clubbing, rash, lesions or edema. NEUROLOGIC: AOx3, no focal motor deficit. SKIN: no jaundice, no rashes   Imaging/Labs: as above  I personally reviewed and interpreted the available labs, imaging and endoscopic files.  Impression and Plan: Annette [name] is a 59 y.o. female with past medical history of depression, diabetes, GERD, hyperlipidemia, hypothyroidism, IBS -M, obesity, OSA, history of breast cancer on remission, who presents for evaluation of intermittent episodes of constipation and diarrhea.  The patient has presented symptoms  that are typical for irritable bowel syndrome-M but with a major component of diarrhea.  She has been able to manage her symptoms with Imodium, which she can continue taking.  However, she may benefit from implementing dietary changes such as a low FODMAP diet and taking an antispasmodic (peppermint oil).  The patient is due for colorectal cancer screening, but we will also perform colonoscopy to obtain samples to rule out microscopic colitis.  The patient understood and agreed.  -Schedule colonoscopy -Explained presumed etiology of IBS symptoms. Patient was counseled about the benefit of implementing a low FODMAP to improve symptoms and recurrent episodes. A dietary list was provided to the patient. Also, the patient was counseled about the benefit of avoiding stressing situations and potential environmental triggers leading to symptomatology. -Start IBGard 1 tablet every 8-12 hours as needed for blaoting or abdominal pain  All questions were answered.      Annette Peppers, MD Gastroenterology and Hepatology [facility]

## 2020-06-05 NOTE — Addendum Note (Signed)
Addended by: Harvel Quale on: 06/05/2020 05:02 PM   Modules accepted: Level of Service

## 2020-06-05 NOTE — Patient Instructions (Signed)
Schedule colonoscopy Explained presumed etiology of IBS symptoms. Patient was counseled about the benefit of implementing a low FODMAP to improve symptoms and recurrent episodes. A dietary list was provided to the patient. Also, the patient was counseled about the benefit of avoiding stressing situations and potential environmental triggers leading to symptomatology. Perform blood workup Start IBGard 1 tablet every 8-12 hours as needed for blaoting or abdominal pain

## 2020-06-05 NOTE — H&P (View-Only) (Signed)
[name], M.D. Gastroenterology & Hepatology [facility] [address] [place], [place] [address] Primary Care Physician: Fayrene Helper, MD [address] [place] [address]  Referring MD: [name], MD  I will communicate my assessment and recommendations to the referring MD via EMR. Note: Occasional unusual wording and randomly placed punctuation marks may result from the use of speech recognition technology to transcribe this document"  Chief Complaint: Intermittent constipation and diarrhea  History of Present Illness: Annette [name] is a 59 y.o. female with past medical history of depression, diabetes, GERD, hyperlipidemia, hypothyroidism, IBS -M, obesity, OSA, history of breast cancer on remission, who presents for evaluation of intermittent episodes of constipation and diarrhea.  Patient reports that she has had a longstanding history of alternating bowel movements.  Due to this, the patient has been seen in the gastroenterology clinic several times in the past, with her predominant symptom being bloating.  She states that 60% of the times she has watery bowel movements, 2-3 times per day without any blood.  She is reports that occasionally her bowel movements have normal consistency, but occasionally she also has episodes of constipation that can last up to 1 week.  She does not take any laxatives.  Notably, she reports that since she started Trulicity she has noticed her symptoms have been happening more frequently.  Patient also reports a longstanding history of episodes of lower abdominal pain described as a pressure, intermittent in nature and usually resolving after the patient has bowel movements.  She also endorses having episodes of bloating that improve as well when she has a bowel movement.  Denies having any vomiting but she feels constantly nauseated.  Has not changed her diet in the past but  has not noticed any unintentional weight loss.   Last cross-sectional abdominal imaging was performed 2015 where she was found to have no acute alterations.  She has status post left adrenalectomy and splenectomy.  The patient denies having any fever, chills, hematochezia, melena, hematemesis, jaundice, pruritus.  Last EGD: 2017, normal esophagus but empiric dilation was performed, a 2 cm hiatal hernia was observed, no other alterations. Last Colonoscopy:2010, she believes she had polyps at that time.  Past Medical History: Past Medical History:  Diagnosis Date  . Breast cancer ([place]) 2007   History of  XRT, onTamoxifen  . Bruises easily   . Depression   . Diabetes mellitus    prediabetic  . GERD (gastroesophageal reflux disease)   . Hypertriglyceridemia   . Hypothyroidism    following chemo amnd radiaition for breast cancer, needed replacement short term  . Hypothyroidism (acquired)    replaced x 1 year  . IBS (irritable bowel syndrome)   . Menieres disease    Controlled with triamterene   . Migraines   . Obesity   . Palpitations   . 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[place])   . Sleep apnea 2010   Problems with CPAP    Past Surgical History: Past Surgical History:  Procedure Laterality Date  . ABDOMINAL HYSTERECTOMY  1998   Benign, fibroids  . 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 APPENDECTOMY  06/12/08  . BREAST RECONSTRUCTION     Left reconstructive  . BREAST SURGERY  2007   Left lumpectomy  . BREAST SURGERY     Mammosite - right side  . CHOLECYSTECTOMY  1985  . ESOPHAGEAL DILATION  12/19/2015   Procedure: ESOPHAGEAL DILATION;  Surgeon: Rogene Houston, MD;  [facility];  Service: Endoscopy;;  . ESOPHAGOGASTRODUODENOSCOPY N/A 12/19/2015   Procedure: ESOPHAGOGASTRODUODENOSCOPY (EGD);  Surgeon: Rogene Houston, MD;  [facility];  Service: Endoscopy;  Laterality: N/A;  11:40  . SPLENECTOMY, TOTAL  1/61/0960    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  . THYROIDECTOMY, PARTIAL  11/05/2010   Benign disease    Family History: Family History  Problem Relation Age of Onset  . Diabetes Mother   . Arrhythmia Mother   . Heart disease Father   . Hyperlipidemia Father   . Arrhythmia Father   . Cancer Paternal Grandmother        Breast  . Diabetes Sister   . Diabetes Brother   . Heart disease Brother     Social History: Social History   Tobacco Use  Smoking Status Current Every Day Smoker  . Packs/day: 0.50  . Years: 40.00  . Pack years: 20.00  . Types: Cigarettes  Smokeless Tobacco Never Used  Tobacco Comment   1/2 pack a day   Social History   Substance and Sexual Activity  Alcohol Use No  . Alcohol/week: 0.0 standard drinks   Comment: Encouraged to quit smoking. She has tried the nicotone gum and patches but did not help   Social History   Substance and Sexual Activity  Drug Use No    Allergies: Allergies  Allergen Reactions  . Penicillins Swelling    Has patient had a PCN reaction causing immediate rash, facial/tongue/throat swelling, SOB or lightheadedness with hypotension: Yes Has patient had a PCN reaction causing severe rash involving mucus membranes or skin necrosis: No Has patient had a PCN reaction that required hospitalization: No  Has patient had a PCN reaction occurring within the last 10 years: No If all of the above answers are "NO", then may proceed with Cephalosporin use.   Of face and tongue.  . Metformin And Related Nausea Only    Pt to discontinue med due to intolerance, cramps and diarrhea  . Statins Other (See Comments)    Major concerns re safety  . Tape Itching    Surgical adhesive  . Zetia [Ezetimibe] Other (See Comments)    Feels funny    Medications: Current Outpatient Medications  Medication Sig Dispense Refill  . aspirin EC 81 MG tablet Take 81 mg by mouth daily.    . Dulaglutide (TRULICITY) 4.54 UJ/8.1XB SOPN Inject 0.75  mg in am weekly under skin 4 pen 2  . esomeprazole (NEXIUM) 40 MG capsule TAKE 1 CAPSULE BY MOUTH EVERY DAY 90 capsule 3  . glipiZIDE (GLUCOTROL XL) 10 MG 24 hr tablet Take two tablets by mouth every morning with breakfast (Patient taking differently: Take 10 mg by mouth daily with breakfast. Take two tablets by mouth every morning with breakfast) 60 tablet 5  . insulin degludec (TRESIBA FLEXTOUCH) 200 UNIT/ML FlexTouch Pen Inject 50 Units into the skin daily. 6 pen 3  . Insulin Pen Needle 32G X 4 MM MISC Use 1x a day 100 each 3  . LORazepam (ATIVAN) 1 MG tablet TAKE ONE TABLET BY MOUTH EVERY 8 HOURS AS NEEDED FOR ANXIETY AND/OR NAUSEA 30 tablet 5  . Metoprolol Tartrate 37.5 MG TABS TAKE 1 TABLET BY MOUTH TWICE DAILY (Patient taking differently: Take 37.5 mg by mouth 2 (two) times daily. ) 180 tablet 3  . naproxen (NAPROSYN) 500 MG tablet Take 1 tablet (500 mg total) by mouth 2 (two) times daily with  a meal. 40 tablet 0  . ONETOUCH VERIO test strip USE TO TEST UP TO 4 TIMES PER DAY 150 strip 5  . rosuvastatin (CRESTOR) 20 MG tablet TAKE 1 TABLET(20 MG) BY MOUTH DAILY 90 tablet 3  . temazepam (RESTORIL) 15 MG capsule Take 1 capsule (15 mg total) by mouth at bedtime. 30 capsule 4  . traZODone (DESYREL) 100 MG tablet TAKE 1 TABLET(100 MG) BY MOUTH AT BEDTIME 90 tablet 3  . triamterene-hydrochlorothiazide (DYAZIDE) 37.5-25 MG capsule Take 1 each (1 capsule total) by mouth daily. 90 capsule 1  . venlafaxine XR (EFFEXOR-XR) 75 MG 24 hr capsule TAKE 1 CAPSULE(75 MG) BY MOUTH DAILY 90 capsule 1  . Vitamin D, Ergocalciferol, (DRISDOL) 1.25 MG (50000 UNIT) CAPS capsule TAKE 1 CAPSULE BY MOUTH ONCE A WEEK 12 capsule 3  . Continuous Blood Gluc Receiver ([place]) DEVI For continuous blood glucose monitoring. (Patient not taking: Reported on 06/05/2020) 1 each 3  . Continuous Blood Gluc Sensor (DEXCOM G6 SENSOR) MISC For continuous blood glucose monitoring. Apply new sensor every 10 days. (Patient not  taking: Reported on 06/05/2020) 3 each 6  . Continuous Blood Gluc Transmit (DEXCOM G6 TRANSMITTER) MISC For continuous blood glucose monitoring. (Patient not taking: Reported on 06/05/2020) 1 each 0  . Dulaglutide (TRULICITY) 1.5 DT/2.6ZT SOPN Inject 0.5 mLs (1.5 mg total) into the skin once a week. (Patient not taking: Reported on 06/05/2020) 4 pen 11   No current facility-administered medications for this visit.    Review of Systems: GENERAL: negative for malaise, night sweats HEENT: No changes in hearing or vision, no nose bleeds or other nasal problems. NECK: Negative for lumps, goiter, pain and significant neck swelling RESPIRATORY: Negative for cough, wheezing CARDIOVASCULAR: Negative for chest pain, leg swelling, palpitations, orthopnea GI: SEE HPI MUSCULOSKELETAL: Negative for joint pain or swelling, back pain, and muscle pain. SKIN: Negative for lesions, rash PSYCH: Negative for sleep disturbance, mood disorder and recent psychosocial stressors. HEMATOLOGY Negative for prolonged bleeding, bruising easily, and swollen nodes. ENDOCRINE: Negative for cold or heat intolerance, polyuria, polydipsia and goiter. NEURO: negative for tremor, gait imbalance, syncope and seizures. The remainder of the review of systems is noncontributory.   Physical Exam: BP (!) 90/51 (BP Location: Right Arm, Patient Position: Sitting, Cuff Size: Large)   Pulse 84   Temp 99.2 F (37.3 C) (Oral)   Ht 5\' 5"  (1.651 m)   Wt 220 lb 8 oz (100 kg)   BMI 36.69 kg/m  GENERAL: The patient is AO x3, in no acute distress.  Obese. HEENT: Head is normocephalic and atraumatic. EOMI are intact. Mouth is well hydrated and without lesions. NECK: Supple. No masses LUNGS: Clear to auscultation. No presence of rhonchi/wheezing/rales. Adequate chest expansion HEART: RRR, normal s1 and s2. ABDOMEN: Mildly tender to palpation diffusely in her abdomen with sparing of the right upper quadrant, but worse on the left side of the  abdomen, no guarding, no peritoneal signs, and nondistended. BS +. No masses. EXTREMITIES: Without any cyanosis, clubbing, rash, lesions or edema. NEUROLOGIC: AOx3, no focal motor deficit. SKIN: no jaundice, no rashes   Imaging/Labs: as above  I personally reviewed and interpreted the available labs, imaging and endoscopic files.  Impression and Plan: GLORA HULGAN is a 59 y.o. female with past medical history of depression, diabetes, GERD, hyperlipidemia, hypothyroidism, IBS -M, obesity, OSA, history of breast cancer on remission, who presents for evaluation of intermittent episodes of constipation and diarrhea.  The patient has presented symptoms  that are typical for irritable bowel syndrome-M but with a major component of diarrhea.  She has been able to manage her symptoms with Imodium, which she can continue taking.  However, she may benefit from implementing dietary changes such as a low FODMAP diet and taking an antispasmodic (peppermint oil).  The patient is due for colorectal cancer screening, but we will also perform colonoscopy to obtain samples to rule out microscopic colitis.  The patient understood and agreed.  -Schedule colonoscopy -Explained presumed etiology of IBS symptoms. Patient was counseled about the benefit of implementing a low FODMAP to improve symptoms and recurrent episodes. A dietary list was provided to the patient. Also, the patient was counseled about the benefit of avoiding stressing situations and potential environmental triggers leading to symptomatology. -Start IBGard 1 tablet every 8-12 hours as needed for blaoting or abdominal pain  All questions were answered.      [name], MD Gastroenterology and Hepatology [facility]

## 2020-06-06 LAB — BASIC METABOLIC PANEL
BUN/Creatinine Ratio: 14 (ref 12–28)
BUN: 13 mg/dL (ref 8–27)
CO2: 23 mmol/L (ref 20–29)
Calcium: 10.1 mg/dL (ref 8.7–10.3)
Chloride: 104 mmol/L (ref 96–106)
Creatinine, Ser: 0.94 mg/dL (ref 0.57–1.00)
GFR calc Af Amer: 76 mL/min/{1.73_m2} (ref >59)
GFR calc non Af Amer: 66 mL/min/{1.73_m2} (ref >59)
Glucose: 73 mg/dL (ref 65–99)
Potassium: 4 mmol/L (ref 3.5–5.2)
Sodium: 141 mmol/L (ref 134–144)

## 2020-06-07 ENCOUNTER — Other Ambulatory Visit: Payer: Self-pay | Admitting: Family Medicine

## 2020-06-11 ENCOUNTER — Ambulatory Visit: Payer: BC Managed Care – PPO | Admitting: Family Medicine

## 2020-06-11 ENCOUNTER — Encounter (INDEPENDENT_AMBULATORY_CARE_PROVIDER_SITE_OTHER): Payer: Self-pay | Admitting: *Deleted

## 2020-06-11 ENCOUNTER — Telehealth (INDEPENDENT_AMBULATORY_CARE_PROVIDER_SITE_OTHER): Payer: Self-pay | Admitting: *Deleted

## 2020-06-11 MED ORDER — PLENVU 140 G PO SOLR
1.0000 | Freq: Once | ORAL | 0 refills | Status: AC
Start: 2020-06-11 — End: 2020-06-11

## 2020-06-11 NOTE — Telephone Encounter (Signed)
Patient needs Plenvu (copay card) ° °

## 2020-06-16 ENCOUNTER — Ambulatory Visit: Payer: BC Managed Care – PPO | Admitting: Family Medicine

## 2020-06-18 ENCOUNTER — Ambulatory Visit (INDEPENDENT_AMBULATORY_CARE_PROVIDER_SITE_OTHER): Payer: BC Managed Care – PPO | Admitting: Gastroenterology

## 2020-06-24 ENCOUNTER — Other Ambulatory Visit: Payer: Self-pay

## 2020-06-24 ENCOUNTER — Encounter: Payer: Self-pay | Admitting: Internal Medicine

## 2020-06-24 ENCOUNTER — Ambulatory Visit: Payer: BC Managed Care – PPO | Admitting: Internal Medicine

## 2020-06-24 VITALS — BP 118/60 | HR 88 | Ht 65.0 in | Wt 218.0 lb

## 2020-06-24 DIAGNOSIS — E1165 Type 2 diabetes mellitus with hyperglycemia: Secondary | ICD-10-CM

## 2020-06-24 DIAGNOSIS — E782 Mixed hyperlipidemia: Secondary | ICD-10-CM

## 2020-06-24 DIAGNOSIS — Z794 Long term (current) use of insulin: Secondary | ICD-10-CM

## 2020-06-24 DIAGNOSIS — E669 Obesity, unspecified: Secondary | ICD-10-CM

## 2020-06-24 LAB — POCT GLYCOSYLATED HEMOGLOBIN (HGB A1C): Hemoglobin A1C: 7 % — AB (ref 4.0–5.6)

## 2020-06-24 MED ORDER — GLIPIZIDE ER 5 MG PO TB24: 5.0000 mg | ORAL_TABLET | Freq: Every day | ORAL | 3 refills | Status: DC

## 2020-06-24 NOTE — Progress Notes (Signed)
Patient ID: Annette [name], female   DOB: 22-Feb-1961, 59 y.o.   MRN: [id number]   This visit occurred during the SARS-CoV-2 public health emergency.  Safety protocols were in place, including screening questions prior to the visit, additional usage of staff PPE, and extensive cleaning of exam room while observing appropriate contact time as indicated for disinfecting solutions.   HPI: Annette [name] is a 59 y.o.-year-old female, referred by her PCP, Dr. Moshe Cipro, for management of DM2, dx in 2018, insulin-dependent since 03/2019, uncontrolled, without long-term complications.  She previously saw Dr. Dorris Fetch and Dr. Chalmers Cater but then switched to seeing me.  Her first visit was in 04/2020.  Reviewed HbA1c levels: Lab Results  Component Value Date   HGBA1C 9.4 (A) 03/11/2020   HGBA1C 7.5 (H) 07/25/2019   HGBA1C 8.3 (H) 03/12/2019   HGBA1C 8.4 (H) 11/21/2018   HGBA1C 8.0 (H) 08/08/2018   HGBA1C 6.9 (H) 01/19/2018   HGBA1C 7.2 (H) 06/14/2017   HGBA1C 6.4 (A) 07/17/2015   HGBA1C 6.0 (H) 11/15/2014   HGBA1C 5.9 (H) 07/12/2014   At last visit she was on: - Glipizide XL 20 mg before breakfast - Lantus 50 units at bedtime (dose increased 04/08/2020) She could not tolerate Metformin due to GI intolerance - abdominal pain and diarrhea. She had vaginal yeast infections and UTIs from [place]. Previously on Tresiba, but unclear why changed to Lantus.  We changed to: - Glipizide XL 10 mg before breakfast - Trulicity 6.16 >> 1.5 mg weekly - started 04/2020 -tolerated well - Tresiba 50 units at bedtime   Pt checks her sugars twice a day: - am: 108, 113-180, 237, 286 >> 89-137, 152 - 2h after b'fast: n/c - before lunch: n/c - 2h after lunch: n/c >> 64 - before dinner: 93-267 >> 51-119, 141, 144, 237 - 2h after dinner: 243, 254 >> 175, 198 - bedtime: n/c >> 94 - nighttime: n/c Lowest sugar was 57 >>  93 >> 51; she has hypoglycemia awareness in the 90s.  Highest sugar was 425 >> 237 (church  meal).  Glucometer: One Touch verio  At last visit, she was eating only 1 meal a day, but now she eats ~2. She only drinks unsweetened tea and coffee.  -No CKD, last BUN/creatinine:  Lab Results  Component Value Date   BUN 13 06/05/2020   BUN 12 03/12/2020   CREATININE 0.94 06/05/2020   CREATININE 0.87 03/12/2020  She is not on an ACE inhibitor/ARB.  -+ HL; last set of lipids: Lab Results  Component Value Date   CHOL 137 02/20/2020   HDL 41 02/20/2020   LDLCALC 75 02/20/2020   TRIG 114 02/20/2020   CHOLHDL 3.1 11/21/2018  On Crestor 20.  She was on Zetia before but not feeling well on it.  - last eye exam was in fall 2020: No DR. She sometimes has double vision.  - No numbness but some tingling in her feet. Her R thumb is half numb.  Pt has FH of DM in mother, brother, sister.  She has a history of HTN, OSA.  She had COVID-19 10/2019.  She recovered well afterwards.  She also had partial thyroidectomy and also adrenalectomy for benign adrenal nodule by Dr. Celine Ahr in the past  Latest TSH was normal: Lab Results  Component Value Date   TSH 2.230 02/20/2020    ROS: Constitutional: + loss, + fatigue, + hot flashes, no subjective hypothermia, poor sleep Eyes: no blurry vision, no xerophthalmia ENT: no sore throat,  no nodules palpated in neck, no dysphagia, no odynophagia, no hoarseness Cardiovascular: no CP/no SOB/no palpitations/no leg swelling Respiratory: no cough/no SOB/no wheezing Gastrointestinal: no N/no V/no D/no C/+ acid reflux Musculoskeletal: + muscle aches/+ joint aches Skin: no rashes, no hair loss Neurological: no tremors/no numbness/no tingling/no dizziness  I reviewed pt's medications, allergies, PMH, social hx, family hx, and changes were documented in the history of present illness. Otherwise, unchanged from my initial visit note.  Past Medical History:  Diagnosis Date  . Breast cancer ([place]) 2007   History of  XRT, onTamoxifen  . Bruises  easily   . Depression   . Diabetes mellitus    prediabetic  . GERD (gastroesophageal reflux disease)   . Hypertriglyceridemia   . Hypothyroidism    following chemo amnd radiaition for breast cancer, needed replacement short term  . Hypothyroidism (acquired)    replaced x 1 year  . IBS (irritable bowel syndrome)   . Menieres disease    Controlled with triamterene   . Migraines   . Obesity   . Palpitations   . 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[place])   . Sleep apnea 2010   Problems with CPAP   Past Surgical History:  Procedure Laterality Date  . ABDOMINAL HYSTERECTOMY  1998   Benign, fibroids  . 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 APPENDECTOMY  06/12/08  . BREAST RECONSTRUCTION     Left reconstructive  . BREAST SURGERY  2007   Left lumpectomy  . BREAST SURGERY     Mammosite - right side  . CHOLECYSTECTOMY  1985  . ESOPHAGEAL DILATION  12/19/2015   Procedure: ESOPHAGEAL DILATION;  Surgeon: Rogene Houston, MD;  Location: [facility];  Service: Endoscopy;;  . ESOPHAGOGASTRODUODENOSCOPY N/A 12/19/2015   Procedure: ESOPHAGOGASTRODUODENOSCOPY (EGD);  Surgeon: Rogene Houston, MD;  Location: [facility];  Service: Endoscopy;  Laterality: N/A;  11:40  . 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 THYROIDECTOMY, PARTIAL  11/05/2010   Benign disease   Social History   Socioeconomic History  . Marital status: Married    Spouse name: Alroy Dust  . Number of children: 2  . Years of education: Not on file  . Highest education level: Not on file  Occupational History  . Not on file  Tobacco Use  . Smoking status: Current Every Day Smoker    Packs/day: 0.50    Years: 40.00    Pack years: 20.00    Types: Cigarettes  . Smokeless tobacco: Never Used  . Tobacco comment: 1/2 pack a day  Substance and Sexual Activity  . Alcohol use: No    Alcohol/week: 0.0 standard drinks    Comment: Encouraged to quit  smoking. She has tried the nicotone gum and patches but did not help  . Drug use: No  . Sexual activity: Yes    Birth control/protection: Surgical  Other Topics Concern  . Not on file  Social History Narrative  . Not on file   Social Determinants of Health   Financial Resource Strain:   . Difficulty of Paying Living Expenses: Not on file  Food Insecurity:   . Worried About Charity fundraiser in the Last Year: Not on file  . Ran Out of Food in the Last Year: Not on file  Transportation Needs:   . Lack of Transportation (Medical): Not on file  . Lack of Transportation (Non-Medical): Not on file  Physical Activity:   .  Days of Exercise per Week: Not on file  . Minutes of Exercise per Session: Not on file  Stress:   . Feeling of Stress : Not on file  Social Connections:   . Frequency of Communication with Friends and Family: Not on file  . Frequency of Social Gatherings with Friends and Family: Not on file  . Attends Religious Services: Not on file  . Active Member of Clubs or Organizations: Not on file  . Attends Archivist Meetings: Not on file  . Marital Status: Not on file  Intimate Partner Violence:   . Fear of Current or Ex-Partner: Not on file  . Emotionally Abused: Not on file  . Physically Abused: Not on file  . Sexually Abused: Not on file   Current Outpatient Medications on File Prior to Visit  Medication Sig Dispense Refill  . aspirin EC 81 MG tablet Take 81 mg by mouth daily.    . Continuous Blood Gluc Receiver ([place]) DEVI For continuous blood glucose monitoring. (Patient not taking: Reported on 06/05/2020) 1 each 3  . Continuous Blood Gluc Sensor (DEXCOM G6 SENSOR) MISC For continuous blood glucose monitoring. Apply new sensor every 10 days. (Patient not taking: Reported on 06/05/2020) 3 each 6  . Continuous Blood Gluc Transmit (DEXCOM G6 TRANSMITTER) MISC For continuous blood glucose monitoring. (Patient not taking: Reported on 06/05/2020) 1  each 0  . Dulaglutide (TRULICITY) 8.25 KN/3.9JQ SOPN Inject 0.75 mg in am weekly under skin (Patient not taking: Reported on 06/24/2020) 4 pen 2  . Dulaglutide (TRULICITY) 1.5 BH/4.1PF SOPN Inject 0.5 mLs (1.5 mg total) into the skin once a week. 4 pen 11  . esomeprazole (NEXIUM) 40 MG capsule TAKE 1 CAPSULE BY MOUTH EVERY DAY (Patient not taking: Reported on 06/24/2020) 90 capsule 3  . glipiZIDE (GLUCOTROL XL) 10 MG 24 hr tablet TAKE 1 TABLET(10 MG) BY MOUTH DAILY WITH BREAKFAST (Patient taking differently: Take 10 mg by mouth daily with breakfast. ) 90 tablet 0  . insulin degludec (TRESIBA FLEXTOUCH) 200 UNIT/ML FlexTouch Pen Inject 50 Units into the skin daily. (Patient taking differently: Inject 50 Units into the skin at bedtime. ) 6 pen 3  . Insulin Pen Needle 32G X 4 MM MISC Use 1x a day 100 each 3  . LORazepam (ATIVAN) 1 MG tablet TAKE ONE TABLET BY MOUTH EVERY 8 HOURS AS NEEDED FOR ANXIETY AND/OR NAUSEA (Patient taking differently: Take 1 mg by mouth every 8 (eight) hours as needed for anxiety (nausea). ) 30 tablet 5  . Metoprolol Tartrate 37.5 MG TABS TAKE 1 TABLET BY MOUTH TWICE DAILY (Patient taking differently: Take 37.5 mg by mouth 2 (two) times daily. ) 180 tablet 3  . naproxen (NAPROSYN) 500 MG tablet Take 1 tablet (500 mg total) by mouth 2 (two) times daily with a meal. (Patient taking differently: Take 500 mg by mouth 2 (two) times daily as needed for moderate pain. ) 40 tablet 0  . omeprazole (PRILOSEC) 40 MG capsule Take 40 mg by mouth daily.    Glory Rosebush VERIO test strip USE TO TEST UP TO 4 TIMES PER DAY 150 strip 5  . Phenazopyridine HCl (AZO TABS PO) Take 1 tablet by mouth in the morning and at bedtime.    . rosuvastatin (CRESTOR) 20 MG tablet TAKE 1 TABLET(20 MG) BY MOUTH DAILY (Patient taking differently: Take 20 mg by mouth daily. ) 90 tablet 3  . temazepam (RESTORIL) 15 MG capsule Take 1 capsule (15 mg total)  by mouth at bedtime. 30 capsule 4  . traZODone (DESYREL) 100 MG  tablet TAKE 1 TABLET(100 MG) BY MOUTH AT BEDTIME (Patient taking differently: Take 100 mg by mouth at bedtime. ) 90 tablet 3  . triamterene-hydrochlorothiazide (DYAZIDE) 37.5-25 MG capsule Take 1 each (1 capsule total) by mouth daily. 90 capsule 1  . venlafaxine XR (EFFEXOR-XR) 75 MG 24 hr capsule TAKE 1 CAPSULE(75 MG) BY MOUTH DAILY (Patient taking differently: Take 75 mg by mouth daily with breakfast. ) 90 capsule 1  . Vitamin D, Ergocalciferol, (DRISDOL) 1.25 MG (50000 UNIT) CAPS capsule TAKE 1 CAPSULE BY MOUTH ONCE A WEEK (Patient taking differently: Take 50,000 Units by mouth every 7 (seven) days. ) 12 capsule 3   No current facility-administered medications on file prior to visit.   Allergies  Allergen Reactions  . Penicillins Swelling    Has patient had a PCN reaction causing immediate rash, facial/tongue/throat swelling, SOB or lightheadedness with hypotension: Yes Has patient had a PCN reaction causing severe rash involving mucus membranes or skin necrosis: No Has patient had a PCN reaction that required hospitalization: No  Has patient had a PCN reaction occurring within the last 10 years: No If all of the above answers are "NO", then may proceed with Cephalosporin use.   Of face and tongue.  . Metformin And Related Diarrhea and Nausea Only    Pt to discontinue med due to intolerance, cramps and diarrhea  . Statins Other (See Comments)    Major concerns re safety  . Tape Itching    Surgical adhesive  . Zetia [Ezetimibe] Other (See Comments)    Feels funny   Family History  Problem Relation Age of Onset  . Diabetes Mother   . Arrhythmia Mother   . Heart disease Father   . Hyperlipidemia Father   . Arrhythmia Father   . Cancer Paternal Grandmother        Breast  . Diabetes Sister   . Diabetes Brother   . Heart disease Brother     PE: BP 118/60   Pulse 88   Ht 5\' 5"  (1.651 m)   Wt 218 lb (98.9 kg)   SpO2 97%   BMI 36.28 kg/m  Wt Readings from Last 3 Encounters:   06/24/20 218 lb (98.9 kg)  06/05/20 220 lb 8 oz (100 kg)  05/29/20 220 lb (99.8 kg)   Constitutional: overweight, in NAD Eyes: PERRLA, EOMI, no exophthalmos ENT: moist mucous membranes, no thyromegaly, no cervical lymphadenopathy Cardiovascular: RRR, No MRG Respiratory: CTA B Gastrointestinal: abdomen soft, NT, ND, BS+ Musculoskeletal: no deformities, strength intact in all 4 Skin: moist, warm, no rashes Neurological: no tremor with outstretched hands, DTR normal in all 4  ASSESSMENT: 1. DM2, insulin-dependent, uncontrolled, without long-term complications, but with hyperglycemia  No family history of medullary thyroid cancer or personal history of pancreatitis.  2. HL  3.  Obesity class II  PLAN:  1. Patient with longstanding, uncontrolled, type 2 diabetes, on oral antidiabetic regimen with sulfonylurea and also long-acting insulin, to which we added weekly GLP-1 receptor agonist at last visit.  At that time her HbA1c was high, at 9.4%.  Of note, she has GI intolerance to Metformin and had UTIs and yeast infections with [place].  At last visit, we discussed about the need to improve her diet.  I strongly advised her against having only 1 meal a day.  Discussed about the benefits of a more plant-based diet.  Otherwise, I also advised her to reduce  the dose of glipizide, to change from Lantus to [place] to help reduce her blood sugar variability and add Trulicity low-dose. -At today's visit, reviewing her detailed sugar log, it appears that her sugars improved significantly since last visit.  They are mostly at goal, with some hyperglycemic excursions, but she also has low blood sugars especially before lunch if she is more active at church or skipping lunch.  She is working not missing lunch anymore.  For now, I advised her to decrease the dose of glipizide only 5 mg before breakfast.  At next visit, I plan to decrease this even more, and, possibly, also decrease the [place] dose. - I  suggested to:  Patient Instructions  Please continue: - Tresiba 50 units daily - Trulicity 1.5 mg weekly  Decrease: - Glipizide XL 5 mg before b'fast  Please return in 3 months with your sugar log.   - we checked her HbA1c: 7.0% much improved - advised to check sugars at different times of the day - 1-2x a day, rotating check times - advised for yearly eye exams >> she is UTD - return to clinic in 3 months   2. HL - Reviewed latest lipid panel from 02/2020: Fractions at goal: Lab Results  Component Value Date   CHOL 137 02/20/2020   HDL 41 02/20/2020   LDLCALC 75 02/20/2020   TRIG 114 02/20/2020   CHOLHDL 3.1 11/21/2018  - Continues the statin without side effects.  3.  Obesity class II -Continue GLP-1 receptor agonist, which should also help with weight loss -She lost 3 pounds last visit.  Decreasing the dose of glipizide at next visit hopefully also decreasing insulin, should also help.  Philemon Kingdom, MD PhD [facility] Endocrinology

## 2020-06-24 NOTE — Addendum Note (Signed)
Addended by: Cardell Peach I on: 06/24/2020 01:41 PM   Modules accepted: Orders

## 2020-06-24 NOTE — Patient Instructions (Signed)
Please continue: - Tresiba 50 units daily - Trulicity 1.5 mg weekly  Decrease: - Glipizide XL 5 mg before b'fast  Please return in 3 months with your sugar log.

## 2020-06-27 ENCOUNTER — Encounter: Payer: Self-pay | Admitting: Internal Medicine

## 2020-06-29 ENCOUNTER — Encounter (INDEPENDENT_AMBULATORY_CARE_PROVIDER_SITE_OTHER): Payer: Self-pay

## 2020-06-30 ENCOUNTER — Other Ambulatory Visit: Payer: Self-pay

## 2020-06-30 ENCOUNTER — Other Ambulatory Visit (HOSPITAL_COMMUNITY)
Admission: RE | Admit: 2020-06-30 | Discharge: 2020-06-30 | Disposition: A | Discharge status: Home / Self Care | Payer: BC Managed Care – PPO | Source: Ambulatory Visit | Attending: Gastroenterology | Admitting: Gastroenterology

## 2020-06-30 ENCOUNTER — Telehealth: Payer: Self-pay

## 2020-06-30 DIAGNOSIS — Z01812 Encounter for preprocedural laboratory examination: Secondary | ICD-10-CM | POA: Insufficient documentation

## 2020-06-30 DIAGNOSIS — Z20822 Contact with and (suspected) exposure to covid-19: Secondary | ICD-10-CM | POA: Insufficient documentation

## 2020-06-30 LAB — BASIC METABOLIC PANEL
Anion gap: 10 (ref 5–15)
BUN: 15 mg/dL (ref 6–20)
CO2: 26 mmol/L (ref 22–32)
Calcium: 9.2 mg/dL (ref 8.9–10.3)
Chloride: 102 mmol/L (ref 98–111)
Creatinine, Ser: 1.06 mg/dL — ABNORMAL HIGH (ref 0.44–1.00)
GFR calc Af Amer: >60 mL/min (ref >60)
GFR calc non Af Amer: 57 mL/min — ABNORMAL LOW (ref >60)
Glucose, Bld: 116 mg/dL — ABNORMAL HIGH (ref 70–99)
Potassium: 3.5 mmol/L (ref 3.5–5.1)
Sodium: 138 mmol/L (ref 135–145)

## 2020-06-30 MED ORDER — TOUJEO SOLOSTAR 300 UNIT/ML ~~LOC~~ SOPN: 50.0000 [IU] | PEN_INJECTOR | Freq: Every day | SUBCUTANEOUS | 1 refills | Status: DC

## 2020-06-30 NOTE — Telephone Encounter (Signed)
RX sent

## 2020-06-30 NOTE — Telephone Encounter (Signed)
Let's try [place] 50 units to start with  - but she may need a slightly higher dose going FWD.

## 2020-06-30 NOTE — Telephone Encounter (Signed)
PA for Tyler Aas has been denied.  Please advise.

## 2020-07-01 LAB — SARS CORONAVIRUS 2 (TAT 6-24 HRS): SARS Coronavirus 2: NEGATIVE

## 2020-07-02 ENCOUNTER — Ambulatory Visit (HOSPITAL_COMMUNITY): Payer: BC Managed Care – PPO | Admitting: Anesthesiology

## 2020-07-02 ENCOUNTER — Other Ambulatory Visit: Payer: Self-pay

## 2020-07-02 ENCOUNTER — Ambulatory Visit (HOSPITAL_COMMUNITY)
Admission: RE | Admit: 2020-07-02 | Discharge: 2020-07-02 | Disposition: A | Discharge status: Home / Self Care | Payer: BC Managed Care – PPO | Attending: Gastroenterology | Admitting: Gastroenterology

## 2020-07-02 ENCOUNTER — Encounter (HOSPITAL_COMMUNITY)
Admission: RE | Disposition: A | Discharge status: Home / Self Care | Payer: Self-pay | Source: Home / Self Care | Attending: Gastroenterology

## 2020-07-02 ENCOUNTER — Encounter (HOSPITAL_COMMUNITY): Payer: Self-pay | Admitting: Gastroenterology

## 2020-07-02 DIAGNOSIS — F419 Anxiety disorder, unspecified: Secondary | ICD-10-CM | POA: Insufficient documentation

## 2020-07-02 DIAGNOSIS — F329 Major depressive disorder, single episode, unspecified: Secondary | ICD-10-CM | POA: Diagnosis not present

## 2020-07-02 DIAGNOSIS — Z9081 Acquired absence of spleen: Secondary | ICD-10-CM | POA: Diagnosis not present

## 2020-07-02 DIAGNOSIS — Z888 Allergy status to other drugs, medicaments and biological substances status: Secondary | ICD-10-CM | POA: Insufficient documentation

## 2020-07-02 DIAGNOSIS — M542 Cervicalgia: Secondary | ICD-10-CM | POA: Insufficient documentation

## 2020-07-02 DIAGNOSIS — Z8349 Family history of other endocrine, nutritional and metabolic diseases: Secondary | ICD-10-CM | POA: Insufficient documentation

## 2020-07-02 DIAGNOSIS — Z791 Long term (current) use of non-steroidal anti-inflammatories (NSAID): Secondary | ICD-10-CM | POA: Insufficient documentation

## 2020-07-02 DIAGNOSIS — Z794 Long term (current) use of insulin: Secondary | ICD-10-CM | POA: Insufficient documentation

## 2020-07-02 DIAGNOSIS — E781 Pure hyperglyceridemia: Secondary | ICD-10-CM | POA: Insufficient documentation

## 2020-07-02 DIAGNOSIS — K219 Gastro-esophageal reflux disease without esophagitis: Secondary | ICD-10-CM | POA: Diagnosis not present

## 2020-07-02 DIAGNOSIS — K582 Mixed irritable bowel syndrome: Secondary | ICD-10-CM | POA: Diagnosis not present

## 2020-07-02 DIAGNOSIS — Z853 Personal history of malignant neoplasm of breast: Secondary | ICD-10-CM | POA: Insufficient documentation

## 2020-07-02 DIAGNOSIS — E669 Obesity, unspecified: Secondary | ICD-10-CM | POA: Diagnosis not present

## 2020-07-02 DIAGNOSIS — Z6836 Body mass index (BMI) 36.0-36.9, adult: Secondary | ICD-10-CM | POA: Insufficient documentation

## 2020-07-02 DIAGNOSIS — Z7982 Long term (current) use of aspirin: Secondary | ICD-10-CM | POA: Insufficient documentation

## 2020-07-02 DIAGNOSIS — H8109 Meniere's disease, unspecified ear: Secondary | ICD-10-CM | POA: Insufficient documentation

## 2020-07-02 DIAGNOSIS — Z880 Allergy status to penicillin: Secondary | ICD-10-CM | POA: Insufficient documentation

## 2020-07-02 DIAGNOSIS — Z79899 Other long term (current) drug therapy: Secondary | ICD-10-CM | POA: Diagnosis not present

## 2020-07-02 DIAGNOSIS — R000 Tachycardia, unspecified: Secondary | ICD-10-CM | POA: Insufficient documentation

## 2020-07-02 DIAGNOSIS — Z833 Family history of diabetes mellitus: Secondary | ICD-10-CM | POA: Insufficient documentation

## 2020-07-02 DIAGNOSIS — K449 Diaphragmatic hernia without obstruction or gangrene: Secondary | ICD-10-CM | POA: Insufficient documentation

## 2020-07-02 DIAGNOSIS — E119 Type 2 diabetes mellitus without complications: Secondary | ICD-10-CM | POA: Diagnosis not present

## 2020-07-02 DIAGNOSIS — E230 Hypopituitarism: Secondary | ICD-10-CM | POA: Insufficient documentation

## 2020-07-02 DIAGNOSIS — Z91048 Other nonmedicinal substance allergy status: Secondary | ICD-10-CM | POA: Insufficient documentation

## 2020-07-02 DIAGNOSIS — D124 Benign neoplasm of descending colon: Secondary | ICD-10-CM

## 2020-07-02 DIAGNOSIS — E785 Hyperlipidemia, unspecified: Secondary | ICD-10-CM | POA: Insufficient documentation

## 2020-07-02 DIAGNOSIS — Z90710 Acquired absence of both cervix and uterus: Secondary | ICD-10-CM | POA: Insufficient documentation

## 2020-07-02 DIAGNOSIS — E890 Postprocedural hypothyroidism: Secondary | ICD-10-CM | POA: Insufficient documentation

## 2020-07-02 DIAGNOSIS — G4733 Obstructive sleep apnea (adult) (pediatric): Secondary | ICD-10-CM | POA: Diagnosis not present

## 2020-07-02 DIAGNOSIS — Z9049 Acquired absence of other specified parts of digestive tract: Secondary | ICD-10-CM | POA: Insufficient documentation

## 2020-07-02 DIAGNOSIS — K621 Rectal polyp: Secondary | ICD-10-CM | POA: Insufficient documentation

## 2020-07-02 DIAGNOSIS — D120 Benign neoplasm of cecum: Secondary | ICD-10-CM | POA: Diagnosis not present

## 2020-07-02 DIAGNOSIS — Z803 Family history of malignant neoplasm of breast: Secondary | ICD-10-CM | POA: Insufficient documentation

## 2020-07-02 DIAGNOSIS — Z8249 Family history of ischemic heart disease and other diseases of the circulatory system: Secondary | ICD-10-CM | POA: Insufficient documentation

## 2020-07-02 DIAGNOSIS — G43909 Migraine, unspecified, not intractable, without status migrainosus: Secondary | ICD-10-CM | POA: Insufficient documentation

## 2020-07-02 DIAGNOSIS — F17210 Nicotine dependence, cigarettes, uncomplicated: Secondary | ICD-10-CM | POA: Insufficient documentation

## 2020-07-02 DIAGNOSIS — K529 Noninfective gastroenteritis and colitis, unspecified: Secondary | ICD-10-CM | POA: Diagnosis present

## 2020-07-02 HISTORY — PX: COLONOSCOPY WITH PROPOFOL: SHX5780

## 2020-07-02 HISTORY — PX: BIOPSY: SHX5522

## 2020-07-02 HISTORY — PX: POLYPECTOMY: SHX5525

## 2020-07-02 LAB — HM COLONOSCOPY

## 2020-07-02 LAB — GLUCOSE, CAPILLARY: Glucose-Capillary: 194 mg/dL — ABNORMAL HIGH (ref 70–99)

## 2020-07-02 SURGERY — COLONOSCOPY WITH PROPOFOL
Anesthesia: General

## 2020-07-02 MED ORDER — STERILE WATER FOR IRRIGATION IR SOLN
Status: DC | PRN
  Administered 2020-07-02: 100 mL

## 2020-07-02 MED ORDER — PROPOFOL 500 MG/50ML IV EMUL
INTRAVENOUS | Status: DC | PRN
  Administered 2020-07-02: 100 ug/kg/min via INTRAVENOUS

## 2020-07-02 MED ORDER — PHENYLEPHRINE 40 MCG/ML (10ML) SYRINGE FOR IV PUSH (FOR BLOOD PRESSURE SUPPORT)
PREFILLED_SYRINGE | INTRAVENOUS | Status: DC | PRN
  Administered 2020-07-02 (×3): 80 ug via INTRAVENOUS

## 2020-07-02 MED ORDER — LIDOCAINE HCL (CARDIAC) PF 100 MG/5ML IV SOSY
PREFILLED_SYRINGE | INTRAVENOUS | Status: DC | PRN
  Administered 2020-07-02: 50 mg via INTRAVENOUS

## 2020-07-02 MED ORDER — LACTATED RINGERS IV SOLN: INTRAVENOUS | Status: DC | PRN

## 2020-07-02 MED ORDER — LACTATED RINGERS IV SOLN
Freq: Once | INTRAVENOUS | Status: AC
  Administered 2020-07-02: 1000 mL via INTRAVENOUS

## 2020-07-02 MED ORDER — PROPOFOL 10 MG/ML IV BOLUS
INTRAVENOUS | Status: DC | PRN
  Administered 2020-07-02: 50 mg via INTRAVENOUS
  Administered 2020-07-02: 100 mg via INTRAVENOUS

## 2020-07-02 NOTE — Discharge Instructions (Signed)
You are being discharged to home.  Resume your previous diet.  We are waiting for your pathology results.  Your physician has recommended a repeat colonoscopy in six months for surveillance.   Colonoscopy, Adult, Care After This sheet gives you information about how to care for yourself after your procedure. Your health care provider may also give you more specific instructions. If you have problems or questions, contact your health care provider. What can I expect after the procedure? After the procedure, it is common to have:  A small amount of blood in your stool for 24 hours after the procedure.  Some gas.  Mild cramping or bloating of your abdomen. Follow these instructions at home: Eating and drinking   Drink enough fluid to keep your urine pale yellow.  Follow instructions from your health care provider   Resume your normal diet  Activity  Rest as told by your health care provider.  Avoid sitting for a long time without moving. Get up to take short walks every 1-2 hours. This is important to improve blood flow and breathing. Ask for help if you feel weak or unsteady.  Managing cramping and bloating   Try walking around when you have cramps or feel bloated.  General instructions  For the first 24 hours after the procedure: ? Do not drive or use machinery. ? Do not sign important documents. ? Do not drink alcohol. ? Do your regular daily activities at a slower pace than normal. ? Eat soft foods that are easy to digest.  Take over-the-counter and prescription medicines only as told by your health care provider.  Keep all follow-up visits as told by your health care provider. This is important. Contact a health care provider if:  You have blood in your stool 2-3 days after the procedure. Get help right away if you have:  More than a small spotting of blood in your stool.  Large blood clots in your stool.  Swelling of your abdomen.  Nausea or  vomiting.  A fever.  Increasing pain in your abdomen that is not relieved with medicine. Summary  After the procedure, it is common to have a small amount of blood in your stool. You may also have mild cramping and bloating of your abdomen.  For the first 24 hours after the procedure, do not drive or use machinery, sign important documents, or drink alcohol.  Get help right away if you have a lot of blood in your stool, nausea or vomiting, a fever, or increased pain in your abdomen. This information is not intended to replace advice given to you by your health care provider. Make sure you discuss any questions you have with your health care provider. Document Revised: 04/16/2019 Document Reviewed: 04/16/2019 Elsevier Patient Education  [place].

## 2020-07-02 NOTE — Transfer of Care (Signed)
Immediate Anesthesia Transfer of Care Note  Patient: Annette [name]  Procedure(s) Performed: COLONOSCOPY WITH PROPOFOL (N/A ) POLYPECTOMY BIOPSY  Patient Location: Endoscopy Unit  Anesthesia Type:General  Level of Consciousness: awake, alert  and oriented  Airway & Oxygen Therapy: Patient Spontanous Breathing  Post-op Assessment: Report given to RN and Post -op Vital signs reviewed and stable  Post vital signs: Reviewed and stable  Last Vitals:  Vitals Value Taken Time  BP    Temp    Pulse    Resp    SpO2      Last Pain:  Vitals:   07/02/20 1025  TempSrc:   PainSc: 0-No pain      Patients Stated Pain Goal: 9 (37/44/51 [id number])  Complications: No complications documented.

## 2020-07-02 NOTE — Anesthesia Postprocedure Evaluation (Signed)
Anesthesia Post Note  Patient: Annette [name]  Procedure(s) Performed: COLONOSCOPY WITH PROPOFOL (N/A ) POLYPECTOMY BIOPSY  Patient location during evaluation: Endoscopy Anesthesia Type: General Level of consciousness: awake and alert and oriented Pain management: pain level controlled Vital Signs Assessment: post-procedure vital signs reviewed and stable Respiratory status: spontaneous breathing, respiratory function stable and nonlabored ventilation Cardiovascular status: blood pressure returned to baseline Postop Assessment: no apparent nausea or vomiting Anesthetic complications: no   No complications documented.   Last Vitals:  Vitals:   07/02/20 0911  BP: 125/68  Pulse: 87  Resp: 14  Temp: 37.5 C  SpO2: 95%    Last Pain:  Vitals:   07/02/20 1025  TempSrc:   PainSc: 0-No pain                 Orlie Dakin

## 2020-07-02 NOTE — Anesthesia Preprocedure Evaluation (Addendum)
Anesthesia Evaluation  Patient identified by MRN, date of birth, ID band Patient awake    Reviewed: Allergy & Precautions, NPO status , Patient's Chart, lab work & pertinent test results, reviewed documented beta blocker date and time   History of Anesthesia Complications Negative for: history of anesthetic complications  Airway Mallampati: II  TM Distance: >3 FB Neck ROM: Full    Dental  (+) Dental Advisory Given, Missing Crown :   Pulmonary sleep apnea and Continuous Positive Airway Pressure Ventilation , pneumonia (covid), Current SmokerPatient did not abstain from smoking.,    Pulmonary exam normal breath sounds clear to auscultation       Cardiovascular Exercise Tolerance: Good Pt. on home beta blockers Normal cardiovascular exam+ dysrhythmias (tachycardia )  Rhythm:Regular Rate:Normal     Neuro/Psych  Headaches, PSYCHIATRIC DISORDERS Anxiety Depression    GI/Hepatic GERD  Medicated and Controlled,Splenectomy     Endo/Other  diabetes, Well Controlled, Type 2, Insulin DependentHypothyroidism Pituitary insufficiency  Renal/GU      Musculoskeletal Neck pain   Abdominal   Peds  Hematology negative hematology ROS (+)   Anesthesia Other Findings breast cancer   Reproductive/Obstetrics negative OB ROS                            Anesthesia Physical Anesthesia Plan  ASA: III  Anesthesia Plan: General   Post-op Pain Management:    Induction: Intravenous  PONV Risk Score and Plan: TIVA  Airway Management Planned: Nasal Cannula, Natural Airway and Simple Face Mask  Additional Equipment:   Intra-op Plan:   Post-operative Plan:   Informed Consent: I have reviewed the patients History and Physical, chart, labs and discussed the procedure including the risks, benefits and alternatives for the proposed anesthesia with the patient or authorized representative who has indicated his/her  understanding and acceptance.     Dental advisory given  Plan Discussed with: CRNA and Surgeon  Anesthesia Plan Comments:         Anesthesia Quick Evaluation

## 2020-07-02 NOTE — Op Note (Signed)
[facility] Patient Name: Annette [name] Procedure Date: 07/02/2020 10:07 AM MRN: [id number] Date of Birth: [birth year] Attending MD: [name] ,  CSN: [id number] Age: 59 Admit Type: Outpatient Procedure:                Colonoscopy Indications:              Chronic diarrhea Providers:                [name], Lambert Mody, Crystal Page,                            Rosina Lowenstein, RN, Casimer Bilis, Technician Referring MD:              Medicines:                Monitored Anesthesia Care Complications:            No immediate complications. Estimated Blood Loss:     Estimated blood loss: none. Procedure:                Pre-Anesthesia Assessment:                           - Prior to the procedure, a History and Physical                            was performed, and patient medications, allergies                            and sensitivities were reviewed. The patient's                            tolerance of previous anesthesia was reviewed.                           - The risks and benefits of the procedure and the                            sedation options and risks were discussed with the                            patient. All questions were answered and informed                            consent was obtained.                           - ASA Grade Assessment: II - A patient with mild                            systemic disease.                           After obtaining informed consent, the colonoscope                            was passed under direct vision. Throughout the  procedure, the patient's blood pressure, pulse, and                            oxygen saturations were monitored continuously. The                            PCF-H190DL ([id number]) scope was introduced through                            the anus and advanced to the the cecum, identified                            by appendiceal orifice and ileocecal valve.  The                            colonoscopy was performed without difficulty. The                            patient tolerated the procedure well. The quality                            of the bowel preparation was excellent. Scope                            withdrawal time was 15 minutes. Scope In: 10:27:57 AM Scope Out: 11:32:23 AM Scope Withdrawal Time: 1 hour 0 minutes 30 seconds  Total Procedure Duration: 1 hour 4 minutes 26 seconds  Findings:      The perianal and digital rectal examinations were normal.      A 15 mm polyp was found in the cecum. The polyp was multi-lobulated and       semi-sessile. Area was successfully injected with 6 mL Eleview for a       lift polypectomy. The polyp was removed with a combination of hot snare       and cold snare in a piecemeal fashion. Resection was complete, and       retrieval was complete. The edges of resection were ablated were tip of       the snare with soft coagulation.      Two sessile polyps were found in the rectum and descending colon. The       polyps were 10 to 12 mm in size. These polyps were removed with a cold       snare. Resection and retrieval were complete.      The rest of the examined colon appeared normal. Biopsies for histology       were taken with a cold forceps from the right colon and left colon for       evaluation of microscopic colitis.      The retroflexed view of the distal rectum and anal verge was normal and       showed no anal or rectal abnormalities. Impression:               - One 15 mm polyp in the cecum, removed with a cold                            snare  and removed with a hot snare. Resected and                            retrieved. Injected.                           - Two 10 to 12 mm polyps in the rectum and in the                            descending colon, removed with a cold snare.                            Resected and retrieved.                           - The rest entire examined colon is  normal.                            Biopsied.                           - The distal rectum and anal verge are normal on                            retroflexion view. Moderate Sedation:      Per Anesthesia Care Recommendation:           - Discharge patient to home (ambulatory).                           - Resume previous diet.                           - Await pathology results.                           - Repeat colonoscopy in 6 months for surveillance. Procedure Code(s):        --- Professional ---                           [phone], GC, Colonoscopy, flexible; with removal of                            tumor(s), polyp(s), or other lesion(s) by snare                            technique                           45381, Colonoscopy, flexible; with directed                            submucosal injection(s), any substance                           93235, 74, Colonoscopy, flexible; with biopsy,  single or multiple Diagnosis Code(s):        --- Professional ---                           K63.5, Polyp of colon                           K62.1, Rectal polyp                           K52.9, Noninfective gastroenteritis and colitis,                            unspecified CPT copyright 2019 [employer]. All rights reserved. The codes documented in this report are preliminary and upon coder review may  be revised to meet current compliance requirements. [name], MD [name],  07/02/2020 11:46:22 AM This report has been signed electronically. Number of Addenda: 0

## 2020-07-02 NOTE — Interval H&P Note (Signed)
History and Physical Interval Note:  07/02/2020 10:23 AM Annette [name] is a 59 y.o. female with past medical history of depression, diabetes, GERD, hyperlipidemia, hypothyroidism, IBS -M, obesity, OSA, history of breast cancer on remission, who came to the hospital for evaluation of intermittent episodes of constipation and diarrhea.  Patient has had a longstanding history of alternating bowel movements for multiple years.  Most of the times she has watery diarrhea 2-3 times per day without any bleeding but occasionally she has constipation as well.  No signs of bloating episodes in the past.  Her last colonoscopy was in 2010 and she believes she had polyps in the past.  No report for the colonoscopy is available.  BP 125/68   Pulse 87   Temp 99.5 F (37.5 C) (Oral)   Resp 14   Ht 5\' 5"  (1.651 m)   Wt 98.9 kg   SpO2 95%   BMI 36.28 kg/m  GENERAL: The patient is AO x3, in no acute distress. HEENT: Head is normocephalic and atraumatic. EOMI are intact. Mouth is well hydrated and without lesions. NECK: Supple. No masses LUNGS: Clear to auscultation. No presence of rhonchi/wheezing/rales. Adequate chest expansion HEART: RRR, normal s1 and s2. ABDOMEN: Soft, nontender, no guarding, no peritoneal signs, and nondistended. BS +. No masses. EXTREMITIES: Without any cyanosis, clubbing, rash, lesions or edema. NEUROLOGIC: AOx3, no focal motor deficit. SKIN: no jaundice, no rashes  [name]  has presented today for surgery, with the diagnosis of chronic diarrhea.  The various methods of treatment have been discussed with the patient and family. After consideration of risks, benefits and other options for treatment, the patient has consented to  Procedure(s) with comments: COLONOSCOPY WITH PROPOFOL (N/A) - 1045 as a surgical intervention.  The patient's history has been reviewed, patient examined, no change in status, stable for surgery.  I have reviewed the patient's chart and labs.   Questions were answered to the patient's satisfaction.     [name] Mayorga

## 2020-07-02 NOTE — Anesthesia Procedure Notes (Signed)
Date/Time: 07/02/2020 10:29 AM Performed by: Orlie Dakin, CRNA Pre-anesthesia Checklist: Patient identified, Emergency Drugs available, Suction available and Patient being monitored Patient Re-evaluated:Patient Re-evaluated prior to induction Oxygen Delivery Method: Nasal cannula Induction Type: IV induction Placement Confirmation: positive ETCO2

## 2020-07-03 LAB — SURGICAL PATHOLOGY

## 2020-07-07 ENCOUNTER — Encounter (HOSPITAL_COMMUNITY): Payer: Self-pay | Admitting: Gastroenterology

## 2020-07-11 ENCOUNTER — Encounter: Payer: Self-pay | Admitting: Family Medicine

## 2020-07-14 ENCOUNTER — Telehealth: Payer: Self-pay | Admitting: Family Medicine

## 2020-07-14 NOTE — Telephone Encounter (Signed)
Please write a letter on this patient's behalf stating her need for electricity  In  her home continually, based on her medical conditions.  She has asked that this be mailed to her I will sign when completed thank you

## 2020-07-15 NOTE — Telephone Encounter (Signed)
Letter completed.

## 2020-07-18 ENCOUNTER — Encounter (INDEPENDENT_AMBULATORY_CARE_PROVIDER_SITE_OTHER): Payer: Self-pay | Admitting: *Deleted

## 2020-07-20 NOTE — Telephone Encounter (Signed)
Noted, thanks, signed and left at front desk for mailing

## 2020-07-25 ENCOUNTER — Other Ambulatory Visit: Payer: Self-pay | Admitting: Family Medicine

## 2020-07-25 DIAGNOSIS — E785 Hyperlipidemia, unspecified: Secondary | ICD-10-CM

## 2020-07-31 ENCOUNTER — Telehealth: Payer: Self-pay | Admitting: Internal Medicine

## 2020-07-31 ENCOUNTER — Other Ambulatory Visit: Payer: Self-pay | Admitting: Family Medicine

## 2020-07-31 NOTE — Telephone Encounter (Signed)
Sent records to [facility] Records from [facility].

## 2020-08-01 ENCOUNTER — Encounter: Payer: Self-pay | Admitting: Family Medicine

## 2020-08-04 ENCOUNTER — Encounter: Payer: Self-pay | Admitting: Family Medicine

## 2020-08-04 ENCOUNTER — Other Ambulatory Visit: Payer: Self-pay

## 2020-08-04 ENCOUNTER — Telehealth (INDEPENDENT_AMBULATORY_CARE_PROVIDER_SITE_OTHER): Payer: BC Managed Care – PPO | Admitting: Family Medicine

## 2020-08-04 DIAGNOSIS — F17218 Nicotine dependence, cigarettes, with other nicotine-induced disorders: Secondary | ICD-10-CM | POA: Diagnosis not present

## 2020-08-04 DIAGNOSIS — J0100 Acute maxillary sinusitis, unspecified: Secondary | ICD-10-CM | POA: Diagnosis not present

## 2020-08-04 DIAGNOSIS — J209 Acute bronchitis, unspecified: Secondary | ICD-10-CM

## 2020-08-04 MED ORDER — FLUCONAZOLE 150 MG PO TABS: 150.0000 mg | ORAL_TABLET | Freq: Once | ORAL | 0 refills | Status: AC

## 2020-08-04 MED ORDER — AZITHROMYCIN 250 MG PO TABS: ORAL_TABLET | ORAL | 0 refills | Status: DC

## 2020-08-04 NOTE — Progress Notes (Signed)
Virtual Visit via Telephone Note  I connected with Annette [name] on 08/04/20 at 10:20 AM EDT by telephone and verified that I am speaking with the correct person using two identifiers.  Location: Patient: home Provider: office   I discussed the limitations, risks, security and privacy concerns of performing an evaluation and management service by telephone and the availability of in person appointments. I also discussed with the patient that there may be a patient responsible charge related to this service. The patient expressed understanding and agreed to proceed.   History of Present Illness: 4 day h/o maxillary pressure, stuffy nose, yellow nasal drainage, and couugh and chest congestion , yellow. No fever , chills or body aches . Started with sore throat, no ear pain. Spouse also sick  Now smoking 10 to 12   Reports improved blood sugar control Observations/Objective:  Good communication with no confusion and intact memory. Alert and oriented x 3 No signs of respiratory distress during speech   Assessment and Plan:  Nicotine dependence Asked:confirms currently smokes cigarettes approximately 10/day Assess: Unwilling to set a quit date, but is cutting back Advise: needs to QUIT to reduce risk of cancer, cardio and cerebrovascular disease Assist: counseled for 5 minutes and literature provided Arrange: follow up in 2 to 4 months   Acute maxillary sinusitis Z pack prescribed  Acute bronchitis Z pack prescribed, pt to use sugar free Robitussin Dm  ' Follow Up Instructions:    I discussed the assessment and treatment plan with the patient. The patient was provided an opportunity to ask questions and all were answered. The patient agreed with the plan and demonstrated an understanding of the instructions.   The patient was advised to call back or seek an in-person evaluation if the symptoms worsen or if the condition fails to improve as anticipated.  I provided  10inutes of non-face-to-face time during this encounter.   [name], MD

## 2020-08-04 NOTE — Patient Instructions (Signed)
F/U as before, call if you ned me sooner  You are treated for sinusitis and bronchitis, Azithromycin and fluconazole are prescribed  Use sugar free robitussin DM for cough  Congrats on greatly improved blood sugar, keep it up  Now smoking 10 to 12 cigarettes daily, continue to work on quitting  Thanks for choosing [facility], we consider it a privelige to serve you.

## 2020-08-05 ENCOUNTER — Encounter: Payer: Self-pay | Admitting: Family Medicine

## 2020-08-05 DIAGNOSIS — J0100 Acute maxillary sinusitis, unspecified: Secondary | ICD-10-CM | POA: Insufficient documentation

## 2020-08-05 NOTE — Assessment & Plan Note (Signed)
Z pack prescribed, pt to use sugar free Robitussin Dm

## 2020-08-05 NOTE — Assessment & Plan Note (Signed)
Asked:confirms currently smokes cigarettes approximately 10/day Assess: Unwilling to set a quit date, but is cutting back Advise: needs to QUIT to reduce risk of cancer, cardio and cerebrovascular disease Assist: counseled for 5 minutes and literature provided Arrange: follow up in 2 to 4 months

## 2020-08-05 NOTE — Assessment & Plan Note (Signed)
Z pack prescribed 

## 2020-08-06 ENCOUNTER — Other Ambulatory Visit: Payer: Self-pay | Admitting: Neurosurgery

## 2020-08-06 DIAGNOSIS — M5136 Other intervertebral disc degeneration, lumbar region: Secondary | ICD-10-CM

## 2020-08-14 ENCOUNTER — Other Ambulatory Visit: Payer: Self-pay | Admitting: Neurosurgery

## 2020-08-14 DIAGNOSIS — M5136 Other intervertebral disc degeneration, lumbar region: Secondary | ICD-10-CM

## 2020-08-22 ENCOUNTER — Other Ambulatory Visit: Payer: Self-pay | Admitting: Family Medicine

## 2020-08-27 ENCOUNTER — Other Ambulatory Visit: Payer: BC Managed Care – PPO

## 2020-08-30 ENCOUNTER — Other Ambulatory Visit: Payer: Self-pay | Admitting: Family Medicine

## 2020-09-09 ENCOUNTER — Ambulatory Visit
Admission: RE | Admit: 2020-09-09 | Discharge: 2020-09-09 | Disposition: A | Discharge status: Home / Self Care | Payer: BC Managed Care – PPO | Source: Ambulatory Visit | Attending: Neurosurgery | Admitting: Neurosurgery

## 2020-09-09 ENCOUNTER — Other Ambulatory Visit: Payer: Self-pay

## 2020-09-09 ENCOUNTER — Other Ambulatory Visit: Payer: Self-pay | Admitting: Neurosurgery

## 2020-09-09 DIAGNOSIS — M5136 Other intervertebral disc degeneration, lumbar region: Secondary | ICD-10-CM

## 2020-09-09 MED ORDER — VANCOMYCIN HCL IN DEXTROSE 1-5 GM/200ML-% IV SOLN
1000.0000 mg | INTRAVENOUS | Status: AC
  Administered 2020-09-09: 1000 mg via INTRAVENOUS

## 2020-09-09 MED ORDER — OXYCODONE-ACETAMINOPHEN 5-325 MG PO TABS
1.0000 | ORAL_TABLET | Freq: Once | ORAL | Status: AC
  Administered 2020-09-09: 1 via ORAL

## 2020-09-09 MED ORDER — MIDAZOLAM HCL 2 MG/2ML IJ SOLN
1.0000 mg | INTRAMUSCULAR | Status: DC | PRN
  Administered 2020-09-09 (×2): 0.5 mg via INTRAVENOUS

## 2020-09-09 MED ORDER — IOPAMIDOL (ISOVUE-M 200) INJECTION 41%
6.5000 mL | Freq: Once | INTRAMUSCULAR | Status: AC
  Administered 2020-09-09: 6.5 mL

## 2020-09-09 MED ORDER — FENTANYL CITRATE (PF) 100 MCG/2ML IJ SOLN
25.0000 ug | INTRAMUSCULAR | Status: DC | PRN
  Administered 2020-09-09: 50 ug via INTRAVENOUS
  Administered 2020-09-09: 25 ug via INTRAVENOUS

## 2020-09-09 MED ORDER — KETOROLAC TROMETHAMINE 30 MG/ML IJ SOLN
30.0000 mg | Freq: Once | INTRAMUSCULAR | Status: AC
  Administered 2020-09-09: 30 mg via INTRAVENOUS

## 2020-09-09 MED ORDER — SODIUM CHLORIDE 0.9 % IV SOLN: Freq: Once | INTRAVENOUS | Status: AC

## 2020-09-09 NOTE — Discharge Instructions (Signed)
Discogram Post Procedure Discharge Instructions ° °1. May resume a regular diet and any medications that you routinely take (including pain medications). °2. No driving day of procedure. °3. Upon discharge go home and rest for at least 4 hours.  May use an ice pack as needed to injection sites on back.  Ice to back 30 minutes on and 30 minutes off, all day. °4. May remove bandades later, today. °5. It is not unusual to be sore for several days after this procedure. ° ° ° °Please contact our office at [phone] for the following symptoms: ° °· Fever greater than 100 degrees °· Increased swelling, pain, or redness at injection site. ° ° °Thank you for visiting [facility]. ° ° °

## 2020-09-09 NOTE — Progress Notes (Signed)
Vital signs taken at [phone]

## 2020-09-09 NOTE — Discharge Instructions (Signed)
Discogram Post Procedure Discharge Instructions  1. You may resume a regular diet and any medications that you routinely take (including pain medications). 2. No driving the day of procedure. 3. Upon discharge go home and rest for at least four hours.  You may use an ice pack as needed to injection sites on back.    Please contact our office at [phone] for the following symptoms:   Fever greater than 100 degrees  Increased swelling, pain, or redness at injection site.   Thank you for visiting [facility].

## 2020-09-09 NOTE — Progress Notes (Signed)
23 minutes of sedation time for discogram.

## 2020-09-23 ENCOUNTER — Encounter: Payer: Self-pay | Admitting: Internal Medicine

## 2020-09-23 ENCOUNTER — Ambulatory Visit: Payer: BC Managed Care – PPO | Admitting: Internal Medicine

## 2020-09-23 NOTE — Progress Notes (Deleted)
Patient ID: Annette [name], female   DOB: [birth year], 59 y.o.   MRN: [id number]   This visit occurred during the SARS-CoV-2 public health emergency.  Safety protocols were in place, including screening questions prior to the visit, additional usage of staff PPE, and extensive cleaning of exam room while observing appropriate contact time as indicated for disinfecting solutions.   HPI: Annette [name] is a 59 y.o.-year-old female, referred by her PCP, Dr. Moshe Cipro, for management of DM2, dx in 2018, insulin-dependent since 03/2019, uncontrolled, without long-term complications.  She previously saw Dr. Dorris Fetch and Dr. Chalmers Cater but then switched to seeing me.  Last visit 3 months ago  Reviewed HbA1c levels Lab Results  Component Value Date   HGBA1C 7.0 (A) 06/24/2020   HGBA1C 9.4 (A) 03/11/2020   HGBA1C 7.5 (H) 07/25/2019   HGBA1C 8.3 (H) 03/12/2019   HGBA1C 8.4 (H) 11/21/2018   HGBA1C 8.0 (H) 08/08/2018   HGBA1C 6.9 (H) 01/19/2018   HGBA1C 7.2 (H) 06/14/2017   HGBA1C 6.4 (A) 07/17/2015   HGBA1C 6.0 (H) 11/15/2014   Previously on: - Glipizide XL 20 mg before breakfast - Lantus 50 units at bedtime (dose increased 04/08/2020) She could not tolerate Metformin due to GI intolerance - abdominal pain and diarrhea. She had vaginal yeast infections and UTIs from [place]. Previously on Tresiba, but unclear why changed to Lantus.  We changed to: - Glipizide XL 10 >> 5 mg before breakfast - Trulicity [id number] >> 1.5 mg weekly - started 04/2020 -tolerated well - Tresiba 50 units at bedtime  >> Toujeo 50 units at bedtime  Pt checks her sugars twice a day: - am: 108, 113-180, 237, 286 >> 89-137, 152 - 2h after b'fast: n/c - before lunch: n/c - 2h after lunch: n/c >> 64 - before dinner: 93-267 >> 51-119, 141, 144, 237 - 2h after dinner: 243, 254 >> 175, 198 - bedtime: n/c >> 94 - nighttime: n/c Lowest sugar was 57 >>  93 >> 51; she has hypoglycemia awareness in the 90s.  Highest sugar was 425 >>  237 (church meal).  Glucometer: One Touch verio  At last visit, she was eating only 1 meal a day, but now she eats ~2. She only drinks unsweetened tea and coffee.  -No history of CKD, last BUN/creatinine:  Lab Results  Component Value Date   BUN 15 06/30/2020   BUN 13 06/05/2020   CREATININE 1.06 (H) 06/30/2020   CREATININE 0.94 06/05/2020  She is not on ACE inhibitor/ARB.  -+ HL; last set of lipids: Lab Results  Component Value Date   CHOL 137 02/20/2020   HDL 41 02/20/2020   LDLCALC 75 02/20/2020   TRIG 114 02/20/2020   CHOLHDL 3.1 11/21/2018  On Crestor 20.  She was on Zetia but was not feeling well on it.  - last eye exam was in fall/2020: No DR reportedly.  She sometimes has double vision.  -She denies numbness but some tingling in her feet. Her R thumb is half numb.  Pt has FH of DM in mother, brother, sister.  She has a history of HTN, OSA.  She had COVID-19 10/2019.  She recovered well afterwards.  She also has a history of partial thyroidectomy and also adrenalectomy for benign adrenal nodule by Dr. Celine Ahr  Review latest TSH: Lab Results  Component Value Date   TSH 2.230 02/20/2020   ROS: Constitutional: no weight gain/no weight loss, + fatigue, + subjective hyperthermia, no subjective hypothermia Eyes: no blurry vision, no  xerophthalmia ENT: no sore throat, no nodules palpated in neck, no dysphagia, no odynophagia, no hoarseness Cardiovascular: no CP/no SOB/no palpitations/no leg swelling Respiratory: no cough/no SOB/no wheezing Gastrointestinal: no N/no V/no D/no C/+ acid reflux Musculoskeletal: + muscle aches/+ joint aches Skin: no rashes, no hair loss Neurological: no tremors/no numbness/no tingling/no dizziness  I reviewed pt's medications, allergies, PMH, social hx, family hx, and changes were documented in the history of present illness. Otherwise, unchanged from my initial visit note.  Past Medical History:  Diagnosis Date  . Breast cancer  ([place]) 2007   History of  XRT, onTamoxifen  . Bruises easily   . Depression   . Diabetes mellitus    prediabetic  . GERD (gastroesophageal reflux disease)   . Hypertriglyceridemia   . Hypothyroidism    following chemo amnd radiaition for breast cancer, needed replacement short term  . Hypothyroidism (acquired)    replaced x 1 year  . IBS (irritable bowel syndrome)   . Menieres disease    Controlled with triamterene   . Migraines   . Obesity   . Palpitations   . 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[place])   . Sleep apnea 2010   Problems with CPAP   Past Surgical History:  Procedure Laterality Date  . ABDOMINAL HYSTERECTOMY  1998   Benign, fibroids  . 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 APPENDECTOMY  06/12/08  . BIOPSY  07/02/2020   Procedure: BIOPSY;  Surgeon: Harvel Quale, MD;  [facility];  Service: Gastroenterology;;  . BREAST RECONSTRUCTION     Left reconstructive  . BREAST SURGERY  2007   Left lumpectomy  . BREAST SURGERY     Mammosite - right side  . CHOLECYSTECTOMY  1985  . COLONOSCOPY WITH PROPOFOL N/A 07/02/2020   Procedure: COLONOSCOPY WITH PROPOFOL;  Surgeon: Harvel Quale, MD;  [facility]: [facility];  Service: Gastroenterology;  Laterality: N/A;  1045  . ESOPHAGEAL DILATION  12/19/2015   Procedure: ESOPHAGEAL DILATION;  Surgeon: Rogene Houston, MD;  [facility];  Service: Endoscopy;;  . ESOPHAGOGASTRODUODENOSCOPY N/A 12/19/2015   Procedure: ESOPHAGOGASTRODUODENOSCOPY (EGD);  Surgeon: Rogene Houston, MD;  [facility];  Service: Endoscopy;  Laterality: N/A;  11:40  . POLYPECTOMY  07/02/2020   Procedure: POLYPECTOMY;  Surgeon: Montez Morita, Quillian Quince, MD;  [facility];  Service: Gastroenterology;;  . SPLENECTOMY, TOTAL  02/04/5464   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  . THYROIDECTOMY, PARTIAL  11/05/2010   Benign disease   Social History    Socioeconomic History  . Marital status: Married    Spouse name: Alroy Dust  . Number of children: 2  . Years of education: Not on file  . Highest education level: Not on file  Occupational History  . Not on file  Tobacco Use  . Smoking status: Current Every Day Smoker    Packs/day: 0.50    Years: 40.00    Pack years: 20.00    Types: Cigarettes  . Smokeless tobacco: Never Used  . Tobacco comment: 1/2 pack a day  Substance and Sexual Activity  . Alcohol use: No    Alcohol/week: 0.0 standard drinks    Comment: Encouraged to quit smoking. She has tried the nicotone gum and patches but did not help  . Drug use: No  . Sexual activity: Yes    Birth control/protection: Surgical  Other Topics Concern  . Not on file  Social History Narrative  .  Not on file   Social Determinants of Health   Financial Resource Strain: Not on file  Food Insecurity: Not on file  Transportation Needs: Not on file  Physical Activity: Not on file  Stress: Not on file  Social Connections: Not on file  Intimate Partner Violence: Not on file   Current Outpatient Medications on File Prior to Visit  Medication Sig Dispense Refill  . aspirin EC 81 MG tablet Take 81 mg by mouth daily.    Marland Kitchen azithromycin (ZITHROMAX) 250 MG tablet Take two tablets on day one , then  Take one tablet once daily for an additional 4 days 6 tablet 0  . Dulaglutide (TRULICITY) 1.5 0000000 SOPN Inject 0.5 mLs (1.5 mg total) into the skin once a week. 4 pen 11  . esomeprazole (NEXIUM) 40 MG capsule TAKE 1 CAPSULE BY MOUTH EVERY DAY 90 capsule 3  . glipiZIDE (GLUCOTROL XL) 5 MG 24 hr tablet Take 1 tablet (5 mg total) by mouth daily with breakfast. 90 tablet 3  . insulin degludec (TRESIBA FLEXTOUCH) 200 UNIT/ML FlexTouch Pen Inject 50 Units into the skin daily. (Patient taking differently: Inject 50 Units into the skin at bedtime. ) 6 pen 3  . insulin glargine, 1 Unit Dial, (TOUJEO SOLOSTAR) 300 UNIT/ML Solostar Pen Inject 50 Units into  the skin daily. 15 mL 1  . Insulin Pen Needle 32G X 4 MM MISC Use 1x a day 100 each 3  . LORazepam (ATIVAN) 1 MG tablet TAKE ONE TABLET BY MOUTH EVERY 8 HOURS AS NEEDED FOR ANXIETY AND/OR NAUSEA (Patient taking differently: Take 1 mg by mouth every 8 (eight) hours as needed for anxiety (nausea). ) 30 tablet 5  . Metoprolol Tartrate 37.5 MG TABS TAKE 1 TABLET BY MOUTH TWICE DAILY 180 tablet 3  . omeprazole (PRILOSEC) 40 MG capsule Take 40 mg by mouth daily.    Glory Rosebush VERIO test strip USE AS DIRECTED FOUR TIMES DAILY 150 strip 5  . rosuvastatin (CRESTOR) 20 MG tablet TAKE 1 TABLET(20 MG) BY MOUTH DAILY (Patient taking differently: Take 20 mg by mouth daily. ) 90 tablet 3  . temazepam (RESTORIL) 15 MG capsule Take 1 capsule (15 mg total) by mouth at bedtime. 30 capsule 4  . traZODone (DESYREL) 100 MG tablet TAKE 1 TABLET(100 MG) BY MOUTH AT BEDTIME (Patient taking differently: Take 100 mg by mouth at bedtime. ) 90 tablet 3  . triamterene-hydrochlorothiazide (DYAZIDE) 37.5-25 MG capsule TAKE ONE CAPSULE BY MOUTH EVERY DAY 90 capsule 1  . venlafaxine XR (EFFEXOR-XR) 75 MG 24 hr capsule TAKE 1 CAPSULE(75 MG) BY MOUTH DAILY 90 capsule 1  . Vitamin D, Ergocalciferol, (DRISDOL) 1.25 MG (50000 UNIT) CAPS capsule TAKE 1 CAPSULE BY MOUTH ONCE A WEEK (Patient taking differently: Take 50,000 Units by mouth every 7 (seven) days. ) 12 capsule 3   No current facility-administered medications on file prior to visit.   Allergies  Allergen Reactions  . Penicillins Swelling    Has patient had a PCN reaction causing immediate rash, facial/tongue/throat swelling, SOB or lightheadedness with hypotension: Yes Has patient had a PCN reaction causing severe rash involving mucus membranes or skin necrosis: No Has patient had a PCN reaction that required hospitalization: No  Has patient had a PCN reaction occurring within the last 10 years: No If all of the above answers are "NO", then may proceed with Cephalosporin  use.   Of face and tongue.  . Metformin And Related Diarrhea and Nausea Only  Pt to discontinue med due to intolerance, cramps and diarrhea  . Wound Dressing Adhesive Itching    Dermabond and other surgical glues for incisions  . Statins Other (See Comments)    Major concerns re safety  . Zetia [Ezetimibe] Other (See Comments)    Feels funny   Family History  Problem Relation Age of Onset  . Diabetes Mother   . Arrhythmia Mother   . Heart disease Father   . Hyperlipidemia Father   . Arrhythmia Father   . Cancer Paternal Grandmother        Breast  . Diabetes Sister   . Diabetes Brother   . Heart disease Brother     PE: There were no vitals taken for this visit. Wt Readings from Last 3 Encounters:  09/09/20 215 lb (97.5 kg)  07/02/20 218 lb (98.9 kg)  06/24/20 218 lb (98.9 kg)   Constitutional: overweight, in NAD Eyes: PERRLA, EOMI, no exophthalmos ENT: moist mucous membranes, no thyromegaly, no cervical lymphadenopathy Cardiovascular: RRR, No MRG Respiratory: CTA B Gastrointestinal: abdomen soft, NT, ND, BS+ Musculoskeletal: no deformities, strength intact in all 4 Skin: moist, warm, no rashes Neurological: no tremor with outstretched hands, DTR normal in all 4  ASSESSMENT: 1. DM2, insulin-dependent, uncontrolled, without long-term complications, but with hyperglycemia  No family history of medullary thyroid cancer or personal history of pancreatitis.  2. HL  3.  Obesity class II  PLAN:  1. Patient with longstanding, uncontrolled, type 2 diabetes, on oral antidiabetic regimen with sulfonylurea, and also basal insulin and weekly GLP-1 receptor agonist.  Her blood sugars improved significantly after adding the GLP-1 receptor agonist and after starting to improve diet.  At last visit, her HbA1c was 7.0%, improved, and blood sugars were significantly better, mostly at goal, with some hyperglycemic excursions and occasional low blood sugars especially before lunch if  she was more active at church or skipping lunch.  I advised her to decrease the dose of glipizide to half.  Otherwise, we did not change her regimen but we were discussing about increasing her insulin dose if her sugars remain low.  Since then, she had to change from [place] to [employer] per insurance preference.  - I suggested to:  Patient Instructions  Please continue: - Tresiba 50 units daily - Trulicity 1.5 mg weekly - Glipizide XL 5 mg before b'fast  Please return in 3-4 months with your sugar log.   - we checked her HbA1c: 7%  - advised to check sugars at different times of the day - 1-2x a day, rotating check times - advised for yearly eye exams >> she is UTD - return to clinic in 3-4 months  2. HL -Reviewed latest lipid panel from 02/2020: Fractions at goal: Lab Results  Component Value Date   CHOL 137 02/20/2020   HDL 41 02/20/2020   LDLCALC 75 02/20/2020   TRIG 114 02/20/2020   CHOLHDL 3.1 11/21/2018  -Continues Crestor 20 without side effects  3.  Obesity class II -Continue GLP-1 receptor agonist, which should also help with weight loss -  Philemon Kingdom, MD PhD [facility]

## 2020-09-25 ENCOUNTER — Encounter: Payer: Self-pay | Admitting: Internal Medicine

## 2020-09-29 ENCOUNTER — Encounter: Payer: Self-pay | Admitting: Internal Medicine

## 2020-09-29 ENCOUNTER — Ambulatory Visit: Payer: BC Managed Care – PPO | Admitting: Internal Medicine

## 2020-09-29 ENCOUNTER — Other Ambulatory Visit: Payer: Self-pay

## 2020-09-29 VITALS — BP 120/78 | HR 82 | Ht 66.0 in | Wt 215.4 lb

## 2020-09-29 DIAGNOSIS — E1165 Type 2 diabetes mellitus with hyperglycemia: Secondary | ICD-10-CM

## 2020-09-29 DIAGNOSIS — E782 Mixed hyperlipidemia: Secondary | ICD-10-CM

## 2020-09-29 DIAGNOSIS — Z794 Long term (current) use of insulin: Secondary | ICD-10-CM | POA: Diagnosis not present

## 2020-09-29 DIAGNOSIS — E669 Obesity, unspecified: Secondary | ICD-10-CM | POA: Diagnosis not present

## 2020-09-29 LAB — POCT GLYCOSYLATED HEMOGLOBIN (HGB A1C): Hemoglobin A1C: 6.6 % — AB (ref 4.0–5.6)

## 2020-09-29 MED ORDER — INSULIN PEN NEEDLE 32G X 4 MM MISC: 3 refills | Status: DC

## 2020-09-29 MED ORDER — TRESIBA FLEXTOUCH 200 UNIT/ML ~~LOC~~ SOPN
50.0000 [IU] | PEN_INJECTOR | Freq: Every day | SUBCUTANEOUS | 3 refills | Status: DC
Start: 2020-09-29 — End: 2021-01-12

## 2020-09-29 NOTE — Progress Notes (Signed)
Patient ID: Annette [name], female   DOB: 1961/02/01, 59 y.o.   MRN: [id number]   This visit occurred during the SARS-CoV-2 public health emergency.  Safety protocols were in place, including screening questions prior to the visit, additional usage of staff PPE, and extensive cleaning of exam room while observing appropriate contact time as indicated for disinfecting solutions.   HPI: Annette [name] is a 59 y.o.-year-old female, referred by her PCP, Dr. Moshe Cipro, for management of DM2, dx in 2018, insulin-dependent since 03/2019, uncontrolled, without long-term complications.  She previously saw Dr. Dorris Fetch and Dr. Chalmers Cater but then switched to seeing me.  Last visit 3 months ago.  She is preparing for lower back surgery.  Approximately 2 weeks ago she had to switch from [place] to [employer] per insurance preference.  Sugars increased so we increased the dose. However, sugars remained higher than before despite increase in dose.  Reviewed HbA1c levels: Lab Results  Component Value Date   HGBA1C 7.0 (A) 06/24/2020   HGBA1C 9.4 (A) 03/11/2020   HGBA1C 7.5 (H) 07/25/2019   HGBA1C 8.3 (H) 03/12/2019   HGBA1C 8.4 (H) 11/21/2018   HGBA1C 8.0 (H) 08/08/2018   HGBA1C 6.9 (H) 01/19/2018   HGBA1C 7.2 (H) 06/14/2017   HGBA1C 6.4 (A) 07/17/2015   HGBA1C 6.0 (H) 11/15/2014   Previously on: - Glipizide XL 20 mg before breakfast - Lantus 50 units at bedtime (dose increased 04/08/2020) She could not tolerate Metformin due to GI intolerance - abdominal pain and diarrhea. She had vaginal yeast infections and UTIs from [place]. Previously on Tresiba, but unclear why changed to Lantus.  We changed to: - Glipizide XL 10 >> 5 mg before breakfast - Trulicity [id number] >> 1.5 mg weekly - started 04/2020 -tolerated well - Tresiba 50 units at bedtime  >> Toujeo 50 >> 56 units at bedtime  Pt checks her sugars twice a day - am: 108, 113-180, 237, 286 >> 89-137, 152 >> 76-126 Tyler Aas), 105-190 (Toujeo) - 2h after  b'fast: n/c - before lunch: n/c - 2h after lunch: n/c >> 64 >> n/c - before dinner: 93-267 >> 51-119, 141, 144, 237 >> 59-159, 162 - 2h after dinner: 243, 254 >> 175, 198 >> n/c - bedtime: n/c >> 94 - nighttime: n/c Lowest sugar was 57 >>  93 >> 51; she has hypoglycemia awareness in the 90s.  Highest sugar was 425 >> 237 (church meal).  Glucometer: One Touch verio  At last visit, she was eating only 1 meal a day, but now she eats ~2. She only drinks unsweetened tea and coffee.  -She has no history of CKD, last BUN/creatinine:  Lab Results  Component Value Date   BUN 15 06/30/2020   BUN 13 06/05/2020   CREATININE 1.06 (H) 06/30/2020   CREATININE 0.94 06/05/2020  Not on ACE inhibitor/ARB.  + HL; last set of lipids: Lab Results  Component Value Date   CHOL 137 02/20/2020   HDL 41 02/20/2020   LDLCALC 75 02/20/2020   TRIG 114 02/20/2020   CHOLHDL 3.1 11/21/2018  On Crestor 20. She was also on Zetia but she did not feel well on it then stopped.  - last eye exam was in fall 2020: No DR reportedly.  She sometimes has double vision.  -no numbness but some tingling in her feet.  Pt has FH of DM in mother, brother, sister.  She also has a history of HTN, OSA.  She had COVID-19 10/2019. She recovered well afterwards  She also  has a history of partial thyroidectomy and also adrenalectomy for benign adrenal nodule by Dr. Celine Ahr  Reviewed latest TSH: Lab Results  Component Value Date   TSH 2.230 02/20/2020   ROS: Constitutional: no weight gain/no weight loss, + fatigue, + subjective hyperthermia, no subjective hypothermia Eyes: no blurry vision, no xerophthalmia ENT: no sore throat, no nodules palpated in neck, no dysphagia, no odynophagia, no hoarseness Cardiovascular: no CP/no SOB/no palpitations/no leg swelling Respiratory: no cough/no SOB/no wheezing Gastrointestinal: no N/no V/no D/no C/+ acid reflux Musculoskeletal: + muscle aches/+ joint aches Skin: no rashes, no  hair loss Neurological: no tremors/no numbness/no tingling/no dizziness  I reviewed pt's medications, allergies, PMH, social hx, family hx, and changes were documented in the history of present illness. Otherwise, unchanged from my initial visit note.  Past Medical History:  Diagnosis Date  . Breast cancer ([facility] [facility]) 2007   History of  XRT, onTamoxifen  . Bruises easily   . Depression   . Diabetes mellitus    prediabetic  . GERD (gastroesophageal reflux disease)   . Hypertriglyceridemia   . Hypothyroidism    following chemo amnd radiaition for breast cancer, needed replacement short term  . Hypothyroidism (acquired)    replaced x 1 year  . IBS (irritable bowel syndrome)   . Menieres disease    Controlled with triamterene   . Migraines   . Obesity   . Palpitations   . 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[place])   . Sleep apnea 2010   Problems with CPAP   Past Surgical History:  Procedure Laterality Date  . ABDOMINAL HYSTERECTOMY  1998   Benign, fibroids  . 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 APPENDECTOMY  06/12/08  . BIOPSY  07/02/2020   Procedure: BIOPSY;  Surgeon: Harvel Quale, MD;  [facility];  Service: Gastroenterology;;  . BREAST RECONSTRUCTION     Left reconstructive  . BREAST SURGERY  2007   Left lumpectomy  . BREAST SURGERY     Mammosite - right side  . CHOLECYSTECTOMY  1985  . COLONOSCOPY WITH PROPOFOL N/A 07/02/2020   Procedure: COLONOSCOPY WITH PROPOFOL;  Surgeon: Harvel Quale, MD;  Location[facility];  Service: Gastroenterology;  Laterality: N/A;  1045  . ESOPHAGEAL DILATION  12/19/2015   Procedure: ESOPHAGEAL DILATION;  Surgeon: Rogene Houston, MD;  Location: [facility];  Service: Endoscopy;;  . ESOPHAGOGASTRODUODENOSCOPY N/A 12/19/2015   Procedure: ESOPHAGOGASTRODUODENOSCOPY (EGD);  Surgeon: Rogene Houston, MD;  [facility];  Service: Endoscopy;  Laterality: N/A;  11:40  .  POLYPECTOMY  07/02/2020   Procedure: POLYPECTOMY;  Surgeon: Montez Morita, Quillian Quince, MD;  Location[facility];  Service: Gastroenterology;;  . 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 THYROIDECTOMY, PARTIAL  11/05/2010   Benign disease   Social History   Socioeconomic History  . Marital status: Married    Spouse name: Alroy Dust  . Number of children: 2  . Years of education: Not on file  . Highest education level: Not on file  Occupational History  . Not on file  Tobacco Use  . Smoking status: Current Every Day Smoker    Packs/day: 0.50    Years: 40.00    Pack years: 20.00    Types: Cigarettes  . Smokeless tobacco: Never Used  . Tobacco comment: 1/2 pack a day  Substance and Sexual Activity  . Alcohol use: No    Alcohol/week: 0.0  standard drinks    Comment: Encouraged to quit smoking. She has tried the nicotone gum and patches but did not help  . Drug use: No  . Sexual activity: Yes    Birth control/protection: Surgical  Other Topics Concern  . Not on file  Social History Narrative  . Not on file   Social Determinants of Health   Financial Resource Strain: Not on file  Food Insecurity: Not on file  Transportation Needs: Not on file  Physical Activity: Not on file  Stress: Not on file  Social Connections: Not on file  Intimate Partner Violence: Not on file   Current Outpatient Medications on File Prior to Visit  Medication Sig Dispense Refill  . aspirin EC 81 MG tablet Take 81 mg by mouth daily.    Marland Kitchen azithromycin (ZITHROMAX) 250 MG tablet Take two tablets on day one , then  Take one tablet once daily for an additional 4 days 6 tablet 0  . Dulaglutide (TRULICITY) 1.5 MG/0.5ML SOPN Inject 0.5 mLs (1.5 mg total) into the skin once a week. 4 pen 11  . esomeprazole (NEXIUM) 40 MG capsule TAKE 1 CAPSULE BY MOUTH EVERY DAY 90 capsule 3  . glipiZIDE (GLUCOTROL XL) 5 MG 24 hr tablet Take 1 tablet (5 mg total) by mouth daily  with breakfast. 90 tablet 3  . insulin degludec (TRESIBA FLEXTOUCH) 200 UNIT/ML FlexTouch Pen Inject 50 Units into the skin daily. (Patient taking differently: Inject 50 Units into the skin at bedtime. ) 6 pen 3  . insulin glargine, 1 Unit Dial, (TOUJEO SOLOSTAR) 300 UNIT/ML Solostar Pen Inject 50 Units into the skin daily. 15 mL 1  . Insulin Pen Needle 32G X 4 MM MISC Use 1x a day 100 each 3  . LORazepam (ATIVAN) 1 MG tablet TAKE ONE TABLET BY MOUTH EVERY 8 HOURS AS NEEDED FOR ANXIETY AND/OR NAUSEA (Patient taking differently: Take 1 mg by mouth every 8 (eight) hours as needed for anxiety (nausea). ) 30 tablet 5  . Metoprolol Tartrate 37.5 MG TABS TAKE 1 TABLET BY MOUTH TWICE DAILY 180 tablet 3  . omeprazole (PRILOSEC) 40 MG capsule Take 40 mg by mouth daily.    Letta Pate VERIO test strip USE AS DIRECTED FOUR TIMES DAILY 150 strip 5  . rosuvastatin (CRESTOR) 20 MG tablet TAKE 1 TABLET(20 MG) BY MOUTH DAILY (Patient taking differently: Take 20 mg by mouth daily. ) 90 tablet 3  . temazepam (RESTORIL) 15 MG capsule Take 1 capsule (15 mg total) by mouth at bedtime. 30 capsule 4  . traZODone (DESYREL) 100 MG tablet TAKE 1 TABLET(100 MG) BY MOUTH AT BEDTIME (Patient taking differently: Take 100 mg by mouth at bedtime. ) 90 tablet 3  . triamterene-hydrochlorothiazide (DYAZIDE) 37.5-25 MG capsule TAKE ONE CAPSULE BY MOUTH EVERY DAY 90 capsule 1  . venlafaxine XR (EFFEXOR-XR) 75 MG 24 hr capsule TAKE 1 CAPSULE(75 MG) BY MOUTH DAILY 90 capsule 1  . Vitamin D, Ergocalciferol, (DRISDOL) 1.25 MG (50000 UNIT) CAPS capsule TAKE 1 CAPSULE BY MOUTH ONCE A WEEK (Patient taking differently: Take 50,000 Units by mouth every 7 (seven) days. ) 12 capsule 3   No current facility-administered medications on file prior to visit.   Allergies  Allergen Reactions  . Penicillins Swelling    Has patient had a PCN reaction causing immediate rash, facial/tongue/throat swelling, SOB or lightheadedness with hypotension:  Yes Has patient had a PCN reaction causing severe rash involving mucus membranes or skin necrosis: No Has patient had  a PCN reaction that required hospitalization: No  Has patient had a PCN reaction occurring within the last 10 years: No If all of the above answers are "NO", then may proceed with Cephalosporin use.   Of face and tongue.  . Metformin And Related Diarrhea and Nausea Only    Pt to discontinue med due to intolerance, cramps and diarrhea  . Wound Dressing Adhesive Itching    Dermabond and other surgical glues for incisions  . Statins Other (See Comments)    Major concerns re safety  . Zetia [Ezetimibe] Other (See Comments)    Feels funny   Family History  Problem Relation Age of Onset  . Diabetes Mother   . Arrhythmia Mother   . Heart disease Father   . Hyperlipidemia Father   . Arrhythmia Father   . Cancer Paternal Grandmother        Breast  . Diabetes Sister   . Diabetes Brother   . Heart disease Brother     PE: BP 120/78   Pulse 82   Ht 5\' 6"  (1.676 m)   Wt 215 lb 6.4 oz (97.7 kg)   SpO2 95%   BMI 34.77 kg/m  Wt Readings from Last 3 Encounters:  09/29/20 215 lb 6.4 oz (97.7 kg)  09/09/20 215 lb (97.5 kg)  07/02/20 218 lb (98.9 kg)   Constitutional: overweight, in NAD Eyes: PERRLA, EOMI, no exophthalmos ENT: moist mucous membranes, no thyromegaly, no cervical lymphadenopathy Cardiovascular: RRR, No MRG Respiratory: CTA B Gastrointestinal: abdomen soft, NT, ND, BS+ Musculoskeletal: no deformities, strength intact in all 4 Skin: moist, warm, no rashes Neurological: no tremor with outstretched hands, DTR normal in all 4  ASSESSMENT: 1. DM2, insulin-dependent, uncontrolled, without long-term complications, but with hyperglycemia  No family history of medullary thyroid cancer or personal history of pancreatitis.  2. HL  3.  Obesity class II  PLAN:  1. Patient with longstanding, uncontrolled, type 2 diabetes, on oral diabetic regimen with  sulfonylurea, also, basal insulin and weekly GLP-1 receptor agonist. Her blood sugars improved significantly after adding the GLP-1 receptor agonist and after starting to improve her diet. At last visit, HbA1c was 7.0%, improved, and blood sugars were significantly better, mostly at goal, with only some hyperglycemic excursions and occasional low blood sugars especially before lunch if she was more active at church or skipping lunch. I advised her to decrease the dose of glipizide to half. Otherwise, we did not change her regimen but we were discussing about decreasing her insulin dose if her sugars remain low. Since then, she had to change from [place] to [employer] per insurance preference. She called [place] several days ago with high blood sugars increase the Toujeo dose. -At today's visit, it appears that sugars increased above the target range after she changed from [place] to [place], despite an increase in dose 4 days ago.  She tells me that she would like to go back to [place] which is only approximately $15 more expensive than Toujeo, which is $35.  She is ready to pay this difference to restart [place].  Since she did have several low blood sugars in the 50s and 60s while taking 50 units of Tresiba, I did advise her to decrease the dose to 46 units.  We will continue the rest of the regimen, but if she continues to have mild lows after decreasing the insulin dose, will need to decrease the glipizide dose further. - I suggested to:  Patient Instructions  Please try to restart: -  Tresiba 46 units daily (Stop Toujeo)  Continue: - Trulicity 1.5 mg weekly - Glipizide XL 5 mg before b'fast  Please call and schedule an eye appt with Dr. Prudencio Burly: [facility]:  Dr. Sherlyn Lick MD ?  Address: [address], [place], [place] [address]  [phone]  Please return in 3 months with your sugar log.   - we checked her HbA1c: 6.6% (lower) - advised to check sugars at different times  of the day - 1-2x a day, rotating check times - advised for yearly eye exams >> she is not UTD -given information about [facility] office - return to clinic in 3 months  2. HL -Reviewed latest lipid panel from 02/2020: Tractions at goal: Lab Results  Component Value Date   CHOL 137 02/20/2020   HDL 41 02/20/2020   LDLCALC 75 02/20/2020   TRIG 114 02/20/2020   CHOLHDL 3.1 11/21/2018  -Continues Crestor 20 without side effects  3.  Obesity class II -Continue GLP-1 receptor agonist, which should also help with weight loss -She lost 3 pounds since last visit  She is cleared for surgery from her diabetes point of view.  Philemon Kingdom, MD PhD [facility]

## 2020-09-29 NOTE — Patient Instructions (Addendum)
Please try to restart: - Tresiba 46 units daily (Stop Toujeo)  Continue: - Trulicity 1.5 mg weekly - Glipizide XL 5 mg before b'fast  Please call and schedule an eye appt with Dr. Randon Goldsmith: [facility]:  Dr. Jeni Salles MD ?  Address: [address] [place], [place], [place] [address]  [phone]  Please return in 3 months with your sugar log.

## 2020-09-30 ENCOUNTER — Other Ambulatory Visit: Payer: Self-pay | Admitting: Family Medicine

## 2020-10-01 ENCOUNTER — Other Ambulatory Visit: Payer: Self-pay | Admitting: Family Medicine

## 2020-10-01 ENCOUNTER — Telehealth: Payer: Self-pay

## 2020-10-01 ENCOUNTER — Encounter: Payer: Self-pay | Admitting: Family Medicine

## 2020-10-01 MED ORDER — TEMAZEPAM 15 MG PO CAPS: 15.0000 mg | ORAL_CAPSULE | Freq: Every day | ORAL | 0 refills | Status: DC

## 2020-10-01 NOTE — Telephone Encounter (Signed)
Temazepam was printed yesterday for Dr Lodema Hong to sign but she didn't get to. Pt called today waiting at the pharmay for it to be refilled. Are you able to refill this?

## 2020-10-01 NOTE — Telephone Encounter (Signed)
Shredded the printed version, I will order it electronic for one month. Thanks

## 2020-10-07 ENCOUNTER — Ambulatory Visit (INDEPENDENT_AMBULATORY_CARE_PROVIDER_SITE_OTHER): Payer: BC Managed Care – PPO | Admitting: Family Medicine

## 2020-10-07 ENCOUNTER — Encounter: Payer: Self-pay | Admitting: Family Medicine

## 2020-10-07 ENCOUNTER — Other Ambulatory Visit: Payer: Self-pay

## 2020-10-07 VITALS — BP 100/65 | HR 94 | Resp 15 | Ht 66.0 in | Wt 215.0 lb

## 2020-10-07 DIAGNOSIS — F411 Generalized anxiety disorder: Secondary | ICD-10-CM

## 2020-10-07 DIAGNOSIS — L989 Disorder of the skin and subcutaneous tissue, unspecified: Secondary | ICD-10-CM | POA: Insufficient documentation

## 2020-10-07 DIAGNOSIS — E1169 Type 2 diabetes mellitus with other specified complication: Secondary | ICD-10-CM | POA: Diagnosis not present

## 2020-10-07 DIAGNOSIS — E785 Hyperlipidemia, unspecified: Secondary | ICD-10-CM

## 2020-10-07 DIAGNOSIS — K219 Gastro-esophageal reflux disease without esophagitis: Secondary | ICD-10-CM | POA: Diagnosis not present

## 2020-10-07 DIAGNOSIS — Z1231 Encounter for screening mammogram for malignant neoplasm of breast: Secondary | ICD-10-CM | POA: Diagnosis not present

## 2020-10-07 DIAGNOSIS — F5104 Psychophysiologic insomnia: Secondary | ICD-10-CM

## 2020-10-07 DIAGNOSIS — F17208 Nicotine dependence, unspecified, with other nicotine-induced disorders: Secondary | ICD-10-CM

## 2020-10-07 DIAGNOSIS — Z794 Long term (current) use of insulin: Secondary | ICD-10-CM

## 2020-10-07 MED ORDER — TEMAZEPAM 15 MG PO CAPS
15.0000 mg | ORAL_CAPSULE | Freq: Every day | ORAL | 5 refills | Status: DC
Start: 2020-10-07 — End: 2020-11-03

## 2020-10-07 MED ORDER — OMEPRAZOLE 40 MG PO CPDR
40.0000 mg | DELAYED_RELEASE_CAPSULE | Freq: Every day | ORAL | 5 refills | Status: DC
Start: 2020-10-07 — End: 2020-10-11

## 2020-10-07 MED ORDER — DOXYCYCLINE HYCLATE 100 MG PO TABS: 100.0000 mg | ORAL_TABLET | Freq: Two times a day (BID) | ORAL | 0 refills | Status: DC

## 2020-10-07 MED ORDER — FLUCONAZOLE 150 MG PO TABS: ORAL_TABLET | ORAL | 0 refills | Status: DC

## 2020-10-07 MED ORDER — LORAZEPAM 1 MG PO TABS: ORAL_TABLET | ORAL | 5 refills | Status: DC

## 2020-10-07 NOTE — Assessment & Plan Note (Signed)
Fleshy growth under left breast for over 2 years, increasing in size and painful x 3 days, with redness, no drainage, asap surgery and oral doxy

## 2020-10-07 NOTE — Patient Instructions (Signed)
F/U IN  5 MONTHS, CALL IF YOU NEED ME SOONER  ANTBIOTIC PRESCRIBED FOR INFECTED SKIN AND URGENT REFERRAL TO SURGERY FOR GROWTH  mAMMOGRAM TO BE SCHEDULED AT CHECKOUT '  LIPID, CMP AND EGFR TODAY  PLEASE CONTINUE TO CUT DOWN ON CIGARETTES, TRY AS MUCH AS YOU CAN, NOW AT 10 TO 15, TRYO TO STICK WITH MAX OF 10  Thanks for choosing [facility], we consider it a privelige to serve you.

## 2020-10-08 LAB — LIPID PANEL
Chol/HDL Ratio: 3 ratio (ref 0.0–4.4)
Cholesterol, Total: 131 mg/dL (ref 100–199)
HDL: 43 mg/dL (ref >39)
LDL Chol Calc (NIH): 66 mg/dL (ref 0–99)
Triglycerides: 124 mg/dL (ref 0–149)
VLDL Cholesterol Cal: 22 mg/dL (ref 5–40)

## 2020-10-08 LAB — CMP14+EGFR
ALT: 18 IU/L (ref 0–32)
AST: 16 IU/L (ref 0–40)
Albumin/Globulin Ratio: 1.7 (ref 1.2–2.2)
Albumin: 4.3 g/dL (ref 3.8–4.8)
Alkaline Phosphatase: 77 IU/L (ref 44–121)
BUN/Creatinine Ratio: 25 (ref 12–28)
BUN: 21 mg/dL (ref 8–27)
Bilirubin Total: 0.3 mg/dL (ref 0.0–1.2)
CO2: 27 mmol/L (ref 20–29)
Calcium: 10.3 mg/dL (ref 8.7–10.3)
Chloride: 102 mmol/L (ref 96–106)
Creatinine, Ser: 0.85 mg/dL (ref 0.57–1.00)
GFR calc Af Amer: 86 mL/min/{1.73_m2} (ref >59)
GFR calc non Af Amer: 74 mL/min/{1.73_m2} (ref >59)
Globulin, Total: 2.5 g/dL (ref 1.5–4.5)
Glucose: 118 mg/dL — ABNORMAL HIGH (ref 65–99)
Potassium: 4.2 mmol/L (ref 3.5–5.2)
Sodium: 142 mmol/L (ref 134–144)
Total Protein: 6.8 g/dL (ref 6.0–8.5)

## 2020-10-10 ENCOUNTER — Telehealth: Payer: Self-pay | Admitting: Internal Medicine

## 2020-10-10 NOTE — Telephone Encounter (Signed)
Patient called to advise that her insurance changed and her pharmacy of choice has changed.    New pharmacy is [facility] - ph# [phone] fax# [phone]

## 2020-10-10 NOTE — Telephone Encounter (Signed)
Patient's preferred pharmacy updated. 

## 2020-10-11 ENCOUNTER — Encounter: Payer: Self-pay | Admitting: Family Medicine

## 2020-10-11 MED ORDER — OMEPRAZOLE 40 MG PO CPDR: 40.0000 mg | DELAYED_RELEASE_CAPSULE | Freq: Every day | ORAL | 5 refills | Status: DC

## 2020-10-11 NOTE — Assessment & Plan Note (Signed)
Sleep hygiene reviewed and written information offered also. Prescription sent for  medication needed.  

## 2020-10-11 NOTE — Assessment & Plan Note (Signed)
Asked:confirms currently smokes cigarettes over 1 PPD Assess: Unwilling to set a quit date, but is trying to cut back Advise: needs to QUIT to reduce risk of cancer, cardio and cerebrovascular disease Assist: counseled for 5 minutes and literature provided Arrange: follow up in 2 to 4 months

## 2020-10-11 NOTE — Assessment & Plan Note (Signed)
Controlled,change to omeprazole due to ins coverage

## 2020-10-11 NOTE — Assessment & Plan Note (Signed)
Continue current management, reports excess stress, has been turned down for disability and is at risk of losing her home. Encouraged her to keep on trying to get approval. Medication management is unchanged

## 2020-10-11 NOTE — Progress Notes (Signed)
Annette [name]     MRN: [id number]      DOB: April 06, 1961   HPI Annette [name] is here for follow up and re-evaluation of chronic medical conditions, medication management and review of any available recent lab and radiology data.  Preventive health is updated, specifically  Cancer screening and Immunization.   Questions or concerns regarding consultations or procedures which the PT has had in the interim are  addressed. The PT denies any adverse reactions to current medications since the last visit.  Denies polyuria, polydipsia, blurred vision , or hypoglycemic episodes. Several year h/o growth under left breast increasing in size over last several months, and tender and red at the base in past 3 days   ROS Denies recent fever or chills. Denies sinus pressure, nasal congestion, ear pain or sore throat. Denies chest congestion, productive cough or wheezing. Denies chest pains, palpitations and leg swelling Denies abdominal pain, nausea, vomiting,diarrhea or constipation.   Denies dysuria, frequency, hesitancy or incontinence. Denies joint pain, swelling and limitation in mobility. Denies headaches, seizures, numbness, or tingling. Denies uncontrolled  depression, anxiety or insomnia. Marland Kitchen   PE  BP 100/65   Pulse 94   Resp 15   Wt 215 lb (97.5 kg)   SpO2 96%   BMI 34.70 kg/m   Patient alert and oriented and in no cardiopulmonary distress.  HEENT: No facial asymmetry, EOMI,     Neck supple .  Chest: Clear to auscultation bilaterally  CVS: S1, S2 no murmurs, no S3.Regular rate.  ABD: Soft non tender.   Ext: No edema  MS: Adequate ROM spine, shoulders, hips and knees.  Skin: Intact, fleshy growth under left base with red base, pedunculated, size of a grape.  Psych: Good eye contact, normal affect. Memory intact not anxious or depressed appearing.  CNS: CN 2-12 intact, power,  normal throughout.no focal deficits noted.   Assessment & Plan  Skin lesion of chest  wall Fleshy growth under left breast for over 2 years, increasing in size and painful x 3 days, with redness, no drainage, asap surgery and oral doxy  GERD (gastroesophageal reflux disease) Controlled,change to omeprazole due to ins coverage   Nicotine dependence Asked:confirms currently smokes cigarettes over 1 PPD Assess: Unwilling to set a quit date, but is trying to cut back Advise: needs to QUIT to reduce risk of cancer, cardio and cerebrovascular disease Assist: counseled for 5 minutes and literature provided Arrange: follow up in 2 to 4 months   Type 2 diabetes mellitus with other specified complication ([place]) Controlled and managed by Endo Annette [name] is reminded of the importance of commitment to daily physical activity for 30 minutes or more, as able and the need to limit carbohydrate intake to 30 to 60 grams per meal to help with blood sugar control.   The need to take medication as prescribed, test blood sugar as directed, and to call between visits if there is a concern that blood sugar is uncontrolled is also discussed.   Annette [name] is reminded of the importance of daily foot exam, annual eye examination, and good blood sugar, blood pressure and cholesterol control.  Diabetic Labs Latest Ref Rng & Units 10/07/2020 09/29/2020 06/30/2020 06/24/2020 06/05/2020  HbA1c 4.0 - 5.6 % - 6.6(A) - 7.0(A) -  Microalbumin Not Estab. ug/mL - - - - -  Micro/Creat Ratio 0.0 - 30.0 mg/g creat - - - - -  Chol 100 - 199 mg/dL 131 - - - -  HDL >39  mg/dL 43 - - - -  Calc LDL 0 - 99 mg/dL 66 - - - -  Triglycerides 0 - 149 mg/dL 124 - - - -  Creatinine 0.57 - 1.00 mg/dL 0.85 - 1.06(H) - 0.94   BP/Weight 10/07/2020 09/29/2020 09/09/2020 07/02/2020 06/24/2020 06/05/2020 4/46/2863  Systolic BP 817 711 97 657 903 90 92  Diastolic BP 65 78 39 63 60 51 62  Wt. (Lbs) 215 215.4 215 218 218 220.5 220  BMI 34.7 34.77 35.78 36.28 36.28 36.69 36.61   Foot/eye exam completion dates 08/23/2019 07/15/2014  Foot  Form Completion Done Done        Hyperlipidemia LDL goal <100 Hyperlipidemia:Low fat diet discussed and encouraged.   Lipid Panel  Lab Results  Component Value Date   CHOL 131 10/07/2020   HDL 43 10/07/2020   LDLCALC 66 10/07/2020   TRIG 124 10/07/2020   CHOLHDL 3.0 10/07/2020    Controlled, no change in medication    GAD (generalized anxiety disorder) Continue current management, reports excess stress, has been turned down for disability and is at risk of losing her home. Encouraged her to keep on trying to get approval. Medication management is unchanged  Insomnia Sleep hygiene reviewed and written information offered also. Prescription sent for  medication needed.

## 2020-10-11 NOTE — Assessment & Plan Note (Signed)
Hyperlipidemia:Low fat diet discussed and encouraged.   Lipid Panel  Lab Results  Component Value Date   CHOL 131 10/07/2020   HDL 43 10/07/2020   LDLCALC 66 10/07/2020   TRIG 124 10/07/2020   CHOLHDL 3.0 10/07/2020    Controlled, no change in medication

## 2020-10-11 NOTE — Assessment & Plan Note (Signed)
Controlled and managed by Endo Ms. [name] is reminded of the importance of commitment to daily physical activity for 30 minutes or more, as able and the need to limit carbohydrate intake to 30 to 60 grams per meal to help with blood sugar control.   The need to take medication as prescribed, test blood sugar as directed, and to call between visits if there is a concern that blood sugar is uncontrolled is also discussed.   Annette [name] is reminded of the importance of daily foot exam, annual eye examination, and good blood sugar, blood pressure and cholesterol control.  Diabetic Labs Latest Ref Rng & Units 10/07/2020 09/29/2020 06/30/2020 06/24/2020 06/05/2020  HbA1c 4.0 - 5.6 % - 6.6(A) - 7.0(A) -  Microalbumin Not Estab. ug/mL - - - - -  Micro/Creat Ratio 0.0 - 30.0 mg/g creat - - - - -  Chol 100 - 199 mg/dL 131 - - - -  HDL >39 mg/dL 43 - - - -  Calc LDL 0 - 99 mg/dL 66 - - - -  Triglycerides 0 - 149 mg/dL 124 - - - -  Creatinine 0.57 - 1.00 mg/dL 0.85 - 1.06(H) - 0.94   BP/Weight 10/07/2020 09/29/2020 09/09/2020 07/02/2020 06/24/2020 06/05/2020 0/30/1314  Systolic BP 388 875 97 797 282 90 92  Diastolic BP 65 78 39 63 60 51 62  Wt. (Lbs) 215 215.4 215 218 218 220.5 220  BMI 34.7 34.77 35.78 36.28 36.28 36.69 36.61   Foot/eye exam completion dates 08/23/2019 07/15/2014  Foot Form Completion Done Done

## 2020-10-13 ENCOUNTER — Encounter: Payer: Self-pay | Admitting: Family Medicine

## 2020-10-14 ENCOUNTER — Ambulatory Visit: Payer: BC Managed Care – PPO | Admitting: General Surgery

## 2020-10-14 ENCOUNTER — Other Ambulatory Visit: Payer: Self-pay | Admitting: General Surgery

## 2020-10-14 ENCOUNTER — Other Ambulatory Visit: Payer: Self-pay

## 2020-10-14 ENCOUNTER — Encounter: Payer: Self-pay | Admitting: General Surgery

## 2020-10-14 ENCOUNTER — Telehealth (INDEPENDENT_AMBULATORY_CARE_PROVIDER_SITE_OTHER): Payer: BC Managed Care – PPO | Admitting: Family Medicine

## 2020-10-14 VITALS — Ht 66.0 in | Wt 215.0 lb

## 2020-10-14 VITALS — BP 90/59 | HR 70 | Temp 97.8°F | Resp 16 | Ht 65.5 in | Wt 215.0 lb

## 2020-10-14 DIAGNOSIS — L989 Disorder of the skin and subcutaneous tissue, unspecified: Secondary | ICD-10-CM | POA: Diagnosis not present

## 2020-10-14 DIAGNOSIS — J069 Acute upper respiratory infection, unspecified: Secondary | ICD-10-CM | POA: Diagnosis not present

## 2020-10-14 MED ORDER — PROMETHAZINE-DM 6.25-15 MG/5ML PO SYRP: 2.5000 mL | ORAL_SOLUTION | Freq: Three times a day (TID) | ORAL | 0 refills | Status: DC | PRN

## 2020-10-14 NOTE — Assessment & Plan Note (Signed)
It as only been 1 day since symptom onset- I have advised it is most likely viral related- and she should be tested for COVID. She reports she will if symptoms continue. No need for antibiotics at this time. Will wait 2-4 more days prior to starting anything if it is needed. Provided with Promethazine DM for cough and congestion   Reviewed side effects, risks and benefits of medication.   Patient acknowledged agreement and understanding of the plan.

## 2020-10-14 NOTE — Patient Instructions (Signed)
I appreciate the opportunity to provide you with care for your health and wellness.  Follow up: 03/10/2021 as scheduled  No labs or referrals today  Call back in 2-4 days if not better. Or if you spike a fever, develop chest pain, shortness of breath of have increased coughing- please go to  ED  Please consider COVID testing now if able to get scheduled for one, our office can help you with this.  Please continue to practice social distancing to keep you, your family, and our community safe.  If you must go out, please wear a mask and practice good handwashing.  It was a pleasure to see you and I look forward to continuing to work together on your health and well-being. Please do not hesitate to call the office if you need care or have questions about your care.  Have a wonderful day. With Gratitude, Cherly Beach, DNP, AGNP-BC

## 2020-10-14 NOTE — Patient Instructions (Signed)
Keep bandaid in place for 48-72 hours. Strips will stay on for 5-7 days. Continue your antibiotic as prescribed. Will call with results next week.  Call with issues.

## 2020-10-14 NOTE — Progress Notes (Signed)
Virtual Visit via Telephone Note   This visit type was conducted due to national recommendations for restrictions regarding the COVID-19 Pandemic (e.g. social distancing) in an effort to limit this patient's exposure and mitigate transmission in our community.  Due to her co-morbid illnesses, this patient is at least at moderate risk for complications without adequate follow up.  This format is felt to be most appropriate for this patient at this time.  The patient did not have access to video technology/had technical difficulties with video requiring transitioning to audio format only (telephone).  All issues noted in this document were discussed and addressed.  No physical exam could be performed with this format.    Evaluation Performed:  Follow-up visit  Date:  10/14/2020   ID:  Annette [name], DOB 1961/07/25, MRN [id number]  Patient Location: Home Provider Location: Office/Clinic   Participants: Nurse for intake and work up; Patient and Provider for Visit and Wrap up  Method of visit: Telephone Location of Patient: Home Location of Provider: Office Consent was obtain for visit over the telephone. Services rendered by provider: Visit was performed via telephone  I verified that I am speaking with the correct person using two identifiers.  PCP:  Fayrene Helper, MD   Chief Complaint:  Cough   History of Present Illness:    Annette [name] is a 60 y.o. female with 1 day history of body aches and cough. Denies recent exposure or sick contacts. Reports it started yesterday with a cough and body aches, did have a sore throat, but that and the body aches have resolved as of this morning.  She reports cough as the worse symptom she as right now. She also says she has some chest congestion- cough not productive. She denies chest pain, shortness of breath, fevers or chills.   No vaccines for Covid. Flu vaccine.   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 And has history of  cancer and reports when she gets sick it usually needs treatment measures.  The patient does not have symptoms concerning for COVID-19 infection (fever, chills, cough, or new shortness of breath).   Past Medical, Surgical, Social History, Allergies, and Medications have been Reviewed.  Past Medical History:  Diagnosis Date  . Arthritis    Phreesia 10/14/2020  . Blood transfusion without reported diagnosis    Phreesia 10/14/2020  . Breast cancer ([place]) 2007   History of  XRT, onTamoxifen  . Bruises easily   . Cancer ([place])    Phreesia 10/14/2020  . Depression   . Depression    Phreesia 10/14/2020  . Diabetes mellitus    prediabetic  . Diabetes mellitus without complication ([place])    Phreesia 10/14/2020  . GERD (gastroesophageal reflux disease)   . Hypertriglyceridemia   . Hypothyroidism    following chemo amnd radiaition for breast cancer, needed replacement short term  . Hypothyroidism (acquired)    replaced x 1 year  . IBS (irritable bowel syndrome)   . Menieres disease    Controlled with triamterene   . Migraines   . Obesity   . Palpitations   . 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[place])   . Sleep apnea 2010   Problems with CPAP   Past Surgical History:  Procedure Laterality Date  . ABDOMINAL HYSTERECTOMY  1998   Benign, fibroids  . 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 APPENDECTOMY  06/12/08  . BIOPSY  07/02/2020   Procedure: BIOPSY;  Surgeon: Montez Morita, Quillian Quince, MD;  [facility];  Service: Gastroenterology;;  . BREAST RECONSTRUCTION     Left reconstructive  . BREAST SURGERY  2007   Left lumpectomy  . BREAST SURGERY     Mammosite - right side  . CHOLECYSTECTOMY  1985  . COLONOSCOPY WITH PROPOFOL N/A 07/02/2020   Procedure: COLONOSCOPY WITH PROPOFOL;  Surgeon: Harvel Quale, MD;  [facility];  Service: Gastroenterology;  Laterality: N/A;  1045  . ESOPHAGEAL DILATION  12/19/2015   Procedure:  ESOPHAGEAL DILATION;  Surgeon: Rogene Houston, MD;  [facility];  Service: Endoscopy;;  . ESOPHAGOGASTRODUODENOSCOPY N/A 12/19/2015   Procedure: ESOPHAGOGASTRODUODENOSCOPY (EGD);  Surgeon: Rogene Houston, MD;  [facility];  Service: Endoscopy;  Laterality: N/A;  11:40  . POLYPECTOMY  07/02/2020   Procedure: POLYPECTOMY;  Surgeon: Montez Morita, Quillian Quince, MD;  [facility];  Service: Gastroenterology;;  . 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 THYROIDECTOMY, PARTIAL  11/05/2010   Benign disease     Current Meds  Medication Sig  . aspirin EC 81 MG tablet Take 81 mg by mouth daily.  Marland Kitchen doxycycline (VIBRA-TABS) 100 MG tablet Take 1 tablet (100 mg total) by mouth 2 (two) times daily.  . Dulaglutide (TRULICITY) 1.5 GM/0.1UU SOPN Inject 0.5 mLs (1.5 mg total) into the skin once a week.  . fluconazole (DIFLUCAN) 150 MG tablet TAKE ONE TABLET ONCE DAILY , AS NEEDED, FOR VAGINAL ITCH ASSOCIATED WITH ANTIBIOTIC USE  . glipiZIDE (GLUCOTROL XL) 5 MG 24 hr tablet Take 1 tablet (5 mg total) by mouth daily with breakfast.  . insulin degludec (TRESIBA FLEXTOUCH) 200 UNIT/ML FlexTouch Pen Inject 50 Units into the skin daily.  . Insulin Pen Needle 32G X 4 MM MISC Use 1x a day  . LORazepam (ATIVAN) 1 MG tablet TAKE ONE TABLET BY MOUTH EVERY 8 HOURS AS NEEDED FOR ANXIETY AND/OR NAUSEA  . Metoprolol Tartrate 37.5 MG TABS TAKE 1 TABLET BY MOUTH TWICE DAILY  . omeprazole (PRILOSEC) 40 MG capsule Take 1 capsule (40 mg total) by mouth daily.  Glory Rosebush VERIO test strip USE AS DIRECTED FOUR TIMES DAILY  . rosuvastatin (CRESTOR) 20 MG tablet TAKE 1 TABLET(20 MG) BY MOUTH DAILY  . temazepam (RESTORIL) 15 MG capsule Take 1 capsule (15 mg total) by mouth at bedtime.  . traZODone (DESYREL) 100 MG tablet TAKE 1 TABLET(100 MG) BY MOUTH AT BEDTIME  . triamterene-hydrochlorothiazide (DYAZIDE) 37.5-25 MG capsule TAKE ONE CAPSULE BY MOUTH EVERY  DAY  . venlafaxine XR (EFFEXOR-XR) 75 MG 24 hr capsule TAKE 1 CAPSULE(75 MG) BY MOUTH DAILY  . Vitamin D, Ergocalciferol, (DRISDOL) 1.25 MG (50000 UNIT) CAPS capsule TAKE 1 CAPSULE BY MOUTH ONCE A WEEK (Patient taking differently: Take 50,000 Units by mouth every 7 (seven) days.)     Allergies:   Penicillins, Metformin and related, Other, Wound dressing adhesive, Statins, and Zetia [ezetimibe]   ROS:   Please see the history of present illness.    All other systems reviewed and are negative.   Labs/Other Tests and Data Reviewed:    Recent Labs: 11/01/2019: B Natriuretic Peptide 46.0 02/20/2020: TSH 2.230 03/12/2020: Hemoglobin 16.3; Platelets 316 10/07/2020: ALT 18; BUN 21; Creatinine, Ser 0.85; Potassium 4.2; Sodium 142   Recent Lipid Panel Lab Results  Component Value Date/Time   CHOL 131 10/07/2020 11:37 AM   TRIG 124 10/07/2020 11:37 AM   HDL 43 10/07/2020  11:37 AM   CHOLHDL 3.0 10/07/2020 11:37 AM   CHOLHDL 5.0 11/15/2014 11:19 AM   LDLCALC 66 10/07/2020 11:37 AM    Wt Readings from Last 3 Encounters:  10/14/20 215 lb (97.5 kg)  10/07/20 215 lb (97.5 kg)  09/29/20 215 lb 6.4 oz (97.7 kg)     Objective:    Vital Signs:  Ht 5\' 6"  (1.676 m)   Wt 215 lb (97.5 kg)   BMI 34.70 kg/m    VITAL SIGNS:  reviewed GEN:  no acute distress RESPIRATORY:  no shortness of breath in converstaion  PSYCH:  normal affect  ASSESSMENT & PLAN:    1. Viral URI with Cough    - promethazine-dextromethorphan (PROMETHAZINE-DM) 6.25-15 MG/5ML syrup; Take 2.5 mLs by mouth 3 (three) times daily as needed for cough.  Dispense: 118 mL; Refill: 0    Time:   Today, I have spent 5 minutes with the patient with telehealth technology discussing the above problems.     Medication Adjustments/Labs and Tests Ordered: Current medicines are reviewed at length with the patient today.  Concerns regarding medicines are outlined above.   Tests Ordered: No orders of the defined types were placed in  this encounter.   Medication Changes: No orders of the defined types were placed in this encounter.    Disposition:  Follow up 03/10/2021  Signed, Perlie Mayo, NP  10/14/2020 8:28 AM     [facility]

## 2020-10-14 NOTE — Progress Notes (Signed)
[facility] History and Physical  Reason for Referral: Left chest wall nodule  Referring Physician: Fayrene Helper, MD  Chief Complaint    New Patient (Initial Visit)      Annette [name] is a 60 y.o. female.  HPI:  Annette [name] comes today and just this AM had a phone call with her PCP regarding congestion and cough. She says she is coughing minimally but did tell our reception area that she had no COVID symptoms. She is already roomed once I saw the documentation from the PCP regarding viral URI and need for COVID test.   Wearing full PPE with N95, I saw the patient. I discussed with her that she can still get the new variant of COVID despite having COVID last January. She is here today due to pain and a left chest wall/ abdominal wall nodule that has been swollen and irritated for the last several days. She has been given antibiotics for this, and she says that it has been present in the area since 2010. She has prior left breast cancer s/p excision and reconstruction in 2007.  She says this area was irritated by her bra and has been painful since that time. She denies any drainage. She thought it had been a skin tag.   Past Medical History:  Diagnosis Date  . Arthritis    Phreesia 10/14/2020  . Blood transfusion without reported diagnosis    Phreesia 10/14/2020  . Breast cancer ([place]) 2007   History of  XRT, onTamoxifen  . Bruises easily   . Cancer ([place])    Phreesia 10/14/2020  . Depression   . Depression    Phreesia 10/14/2020  . Diabetes mellitus    prediabetic  . Diabetes mellitus without complication ([place])    Phreesia 10/14/2020  . GERD (gastroesophageal reflux disease)   . Hypertriglyceridemia   . Hypothyroidism    following chemo amnd radiaition for breast cancer, needed replacement short term  . Hypothyroidism (acquired)    replaced x 1 year  . IBS (irritable bowel syndrome)   . Menieres disease    Controlled with triamterene   . Migraines    . Obesity   . Palpitations   . 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[place])   . Sleep apnea 2010   Problems with CPAP    Past Surgical History:  Procedure Laterality Date  . ABDOMINAL HYSTERECTOMY  1998   Benign, fibroids  . 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 APPENDECTOMY  06/12/08  . BIOPSY  07/02/2020   Procedure: BIOPSY;  Surgeon: Harvel Quale, MD;  [facility];  Service: Gastroenterology;;  . BREAST RECONSTRUCTION     Left reconstructive  . BREAST SURGERY  2007   Left lumpectomy  . BREAST SURGERY     Mammosite - right side  . CHOLECYSTECTOMY  1985  . COLONOSCOPY WITH PROPOFOL N/A 07/02/2020   Procedure: COLONOSCOPY WITH PROPOFOL;  Surgeon: Harvel Quale, MD;  [facility];  Service: Gastroenterology;  Laterality: N/A;  1045  . ESOPHAGEAL DILATION  12/19/2015   Procedure: ESOPHAGEAL DILATION;  Surgeon: Rogene Houston, MD;  [facility];  Service: Endoscopy;;  . ESOPHAGOGASTRODUODENOSCOPY N/A 12/19/2015   Procedure: ESOPHAGOGASTRODUODENOSCOPY (EGD);  Surgeon: Rogene Houston, MD;  [facility];  Service: Endoscopy;  Laterality: N/A;  11:40  . POLYPECTOMY  07/02/2020   Procedure: POLYPECTOMY;  Surgeon: Harvel Quale, MD;  [facility];  Service:  Gastroenterology;;  . 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 THYROIDECTOMY, PARTIAL  11/05/2010   Benign disease    Family History  Problem Relation Age of Onset  . Diabetes Mother   . Arrhythmia Mother   . Heart disease Father   . Hyperlipidemia Father   . Arrhythmia Father   . Cancer Paternal Grandmother        Breast  . Diabetes Sister   . Diabetes Brother   . Heart disease Brother     Social History   Tobacco Use  . Smoking status: Current Every Day Smoker    Packs/day: 0.50    Years: 40.00    Pack years: 20.00    Types: Cigarettes  . Smokeless  tobacco: Never Used  . Tobacco comment: 1/2 pack a day  Substance Use Topics  . Alcohol use: No    Alcohol/week: 0.0 standard drinks    Comment: Encouraged to quit smoking. She has tried the nicotone gum and patches but did not help  . Drug use: No    Medications: I have reviewed the patient's current medications. Allergies as of 10/14/2020      Reactions   Penicillins Swelling   Has patient had a PCN reaction causing immediate rash, facial/tongue/throat swelling, SOB or lightheadedness with hypotension: Yes Has patient had a PCN reaction causing severe rash involving mucus membranes or skin necrosis: No Has patient had a PCN reaction that required hospitalization: No  Has patient had a PCN reaction occurring within the last 10 years: No If all of the above answers are "NO", then may proceed with Cephalosporin use. Of face and tongue.   Metformin And Related Diarrhea, Nausea Only   Pt to discontinue med due to intolerance, cramps and diarrhea   Other Swelling   Wound Dressing Adhesive Itching   Dermabond and other surgical glues for incisions   Statins Other (See Comments)   Major concerns re safety   Zetia [ezetimibe] Other (See Comments)   Feels funny      Medication List       Accurate as of October 14, 2020 10:05 AM. If you have any questions, ask your nurse or doctor.        aspirin EC 81 MG tablet Take 81 mg by mouth daily.   doxycycline 100 MG tablet Commonly known as: VIBRA-TABS Take 1 tablet (100 mg total) by mouth 2 (two) times daily.   fluconazole 150 MG tablet Commonly known as: DIFLUCAN TAKE ONE TABLET ONCE DAILY , AS NEEDED, FOR VAGINAL ITCH ASSOCIATED WITH ANTIBIOTIC USE   glipiZIDE 5 MG 24 hr tablet Commonly known as: GLUCOTROL XL Take 1 tablet (5 mg total) by mouth daily with breakfast.   Insulin Pen Needle 32G X 4 MM Misc Use 1x a day   LORazepam 1 MG tablet Commonly known as: ATIVAN TAKE ONE TABLET BY MOUTH EVERY 8 HOURS AS NEEDED FOR ANXIETY  AND/OR NAUSEA   Metoprolol Tartrate 37.5 MG Tabs TAKE 1 TABLET BY MOUTH TWICE DAILY   omeprazole 40 MG capsule Commonly known as: PRILOSEC Take 1 capsule (40 mg total) by mouth daily.   OneTouch Verio test strip Generic drug: glucose blood USE AS DIRECTED FOUR TIMES DAILY   promethazine-dextromethorphan 6.25-15 MG/5ML syrup Commonly known as: PROMETHAZINE-DM Take 2.5 mLs by mouth 3 (three) times daily as needed for cough. Started by: Perlie Mayo, NP   rosuvastatin 20 MG tablet Commonly known as: CRESTOR TAKE 1 TABLET(20  MG) BY MOUTH DAILY   temazepam 15 MG capsule Commonly known as: RESTORIL Take 1 capsule (15 mg total) by mouth at bedtime.   traZODone 100 MG tablet Commonly known as: DESYREL TAKE 1 TABLET(100 MG) BY MOUTH AT BEDTIME   Tresiba FlexTouch 200 UNIT/ML FlexTouch Pen Generic drug: insulin degludec Inject 50 Units into the skin daily.   triamterene-hydrochlorothiazide 37.5-25 MG capsule Commonly known as: DYAZIDE TAKE ONE CAPSULE BY MOUTH EVERY DAY   Trulicity 1.5 TD/1.7OH Sopn Generic drug: Dulaglutide Inject 0.5 mLs (1.5 mg total) into the skin once a week.   venlafaxine XR 75 MG 24 hr capsule Commonly known as: EFFEXOR-XR TAKE 1 CAPSULE(75 MG) BY MOUTH DAILY   Vitamin D (Ergocalciferol) 1.25 MG (50000 UNIT) Caps capsule Commonly known as: DRISDOL TAKE 1 CAPSULE BY MOUTH ONCE A WEEK What changed: when to take this        ROS:  A comprehensive review of systems was negative except for: Respiratory: positive for cough and wheezing Gastrointestinal: positive for abdominal pain and nausea Musculoskeletal: positive for back pain, neck pain and joint pain Pain in the left lower chest/ abdominal wall at nodule site   Blood pressure (!) 90/59, pulse 70, temperature 97.8 F (36.6 C), temperature source Other (Comment), resp. rate 16, height 5' 5.5" (1.664 m), weight 215 lb (97.5 kg), SpO2 94 %. Physical Exam Vitals reviewed.  HENT:     Head:  Normocephalic.     Mouth/Throat:     Comments: Mask in place Eyes:     Extraocular Movements: Extraocular movements intact.  Cardiovascular:     Rate and Rhythm: Normal rate.  Pulmonary:     Effort: Pulmonary effort is normal.  Chest:     Comments: Breast right and left with scars from reconstruction, inferior to left breast on chest wall lateral a 1.5cm nodular mass that is pedunculate on a stalk, some tenderness and mild erythema, swollen, no drainage  Abdominal:     General: There is no distension.     Palpations: Abdomen is soft.     Tenderness: There is no abdominal tenderness.  Musculoskeletal:        General: Normal range of motion.  Neurological:     General: No focal deficit present.     Mental Status: She is alert and oriented to person, place, and time.  Psychiatric:        Mood and Affect: Mood normal.        Behavior: Behavior normal.        Thought Content: Thought content normal.        Judgment: Judgment normal.     Results: None   Procedure: Excision of skin nodule 1.5cm  Procedure diagnosis: Left chest wall nodule   Description: We discussed excision and risk of bleeding, infection, the wound opening up or finding something other than a benign tag or nodule given her history of cancer. The left chest wall inferior to the breast was prepped with betadine. Xylocaine 1% with epinephrine was used to anesthetize the area. An elliptical excision of skin was made around the nodule with about 1-59mm margin, and carried down through the dermis with sharp excision with a scalpel. Hemostasis was confirmed. The specimen was sent to pathology. The skin was closed with interrupted 3-0 Vicryl and benzoin and steri strips were placed on the skin. The incision was covered with a Band-Aid.   The patient tolerated the procedure well.   Assessment & Plan:  Annette [name] is a  60 y.o. female with left chest wall nodule that is irritated from her bra. It looks like a enlarged and  swollen skin tag but given her history I cannot be sure until we get the pathology back.   Keep bandaid in place for 48-72 hours. Strips will stay on for 5-7 days. Continue your antibiotic as prescribed. Will call with results next week.  Call with issues.  Tylenol and ibuprofen for pain   Encouraged patient to get COVID testing and to limit exposure to others.   All questions were answered to the satisfaction of the patient.   Virl Cagey 10/14/2020, 10:05 AM

## 2020-10-27 ENCOUNTER — Inpatient Hospital Stay (HOSPITAL_COMMUNITY): Admission: RE | Admit: 2020-10-27 | Payer: BC Managed Care – PPO | Source: Ambulatory Visit

## 2020-10-29 ENCOUNTER — Ambulatory Visit: Payer: BC Managed Care – PPO | Admitting: Family Medicine

## 2020-10-31 ENCOUNTER — Encounter: Payer: Self-pay | Admitting: Family Medicine

## 2020-10-31 ENCOUNTER — Other Ambulatory Visit: Payer: Self-pay | Admitting: Family Medicine

## 2020-11-01 ENCOUNTER — Other Ambulatory Visit: Payer: Self-pay | Admitting: Family Medicine

## 2020-11-03 ENCOUNTER — Other Ambulatory Visit: Payer: Self-pay | Admitting: Family Medicine

## 2020-11-03 MED ORDER — TEMAZEPAM 15 MG PO CAPS: 15.0000 mg | ORAL_CAPSULE | Freq: Every day | ORAL | 5 refills | Status: DC

## 2020-11-04 ENCOUNTER — Other Ambulatory Visit: Payer: Self-pay

## 2020-11-04 ENCOUNTER — Inpatient Hospital Stay (HOSPITAL_COMMUNITY): Payer: BC Managed Care – PPO | Attending: Hematology

## 2020-11-04 DIAGNOSIS — D72829 Elevated white blood cell count, unspecified: Secondary | ICD-10-CM | POA: Diagnosis not present

## 2020-11-04 DIAGNOSIS — Z853 Personal history of malignant neoplasm of breast: Secondary | ICD-10-CM | POA: Diagnosis not present

## 2020-11-04 DIAGNOSIS — F17210 Nicotine dependence, cigarettes, uncomplicated: Secondary | ICD-10-CM | POA: Diagnosis not present

## 2020-11-04 DIAGNOSIS — D751 Secondary polycythemia: Secondary | ICD-10-CM | POA: Insufficient documentation

## 2020-11-04 LAB — CBC WITH DIFFERENTIAL/PLATELET
Abs Immature Granulocytes: 0.02 10*3/uL (ref 0.00–0.07)
Basophils Absolute: 0.1 10*3/uL (ref 0.0–0.1)
Basophils Relative: 1 %
Eosinophils Absolute: 0.2 10*3/uL (ref 0.0–0.5)
Eosinophils Relative: 2 %
HCT: 48.4 % — ABNORMAL HIGH (ref 36.0–46.0)
Hemoglobin: 16.1 g/dL — ABNORMAL HIGH (ref 12.0–15.0)
Immature Granulocytes: 0 %
Lymphocytes Relative: 36 %
Lymphs Abs: 4 10*3/uL (ref 0.7–4.0)
MCH: 33.1 pg (ref 26.0–34.0)
MCHC: 33.3 g/dL (ref 30.0–36.0)
MCV: 99.4 fL (ref 80.0–100.0)
Monocytes Absolute: 0.7 10*3/uL (ref 0.1–1.0)
Monocytes Relative: 6 %
Neutro Abs: 6 10*3/uL (ref 1.7–7.7)
Neutrophils Relative %: 55 %
Platelets: 364 10*3/uL (ref 150–400)
RBC: 4.87 MIL/uL (ref 3.87–5.11)
RDW: 13.4 % (ref 11.5–15.5)
WBC: 11.1 10*3/uL — ABNORMAL HIGH (ref 4.0–10.5)
nRBC: 0 % (ref 0.0–0.2)

## 2020-11-04 LAB — LACTATE DEHYDROGENASE: LDH: 142 U/L (ref 98–192)

## 2020-11-05 ENCOUNTER — Other Ambulatory Visit: Payer: Self-pay | Admitting: Neurosurgery

## 2020-11-05 ENCOUNTER — Encounter (HOSPITAL_COMMUNITY): Payer: Self-pay

## 2020-11-05 ENCOUNTER — Ambulatory Visit (HOSPITAL_COMMUNITY)
Admission: RE | Admit: 2020-11-05 | Discharge: 2020-11-05 | Disposition: A | Discharge status: Home / Self Care | Payer: BC Managed Care – PPO | Source: Ambulatory Visit | Attending: Family Medicine | Admitting: Family Medicine

## 2020-11-05 DIAGNOSIS — Z1231 Encounter for screening mammogram for malignant neoplasm of breast: Secondary | ICD-10-CM | POA: Insufficient documentation

## 2020-11-07 ENCOUNTER — Other Ambulatory Visit: Payer: Self-pay

## 2020-11-07 DIAGNOSIS — E1165 Type 2 diabetes mellitus with hyperglycemia: Secondary | ICD-10-CM | POA: Insufficient documentation

## 2020-11-07 DIAGNOSIS — E7800 Pure hypercholesterolemia, unspecified: Secondary | ICD-10-CM | POA: Insufficient documentation

## 2020-11-11 ENCOUNTER — Encounter (HOSPITAL_COMMUNITY): Payer: Self-pay | Admitting: Hematology

## 2020-11-11 ENCOUNTER — Other Ambulatory Visit (HOSPITAL_COMMUNITY): Payer: Self-pay | Admitting: Hematology

## 2020-11-11 ENCOUNTER — Inpatient Hospital Stay (HOSPITAL_BASED_OUTPATIENT_CLINIC_OR_DEPARTMENT_OTHER): Payer: BC Managed Care – PPO | Admitting: Hematology

## 2020-11-11 ENCOUNTER — Other Ambulatory Visit: Payer: Self-pay

## 2020-11-11 ENCOUNTER — Other Ambulatory Visit (HOSPITAL_COMMUNITY): Payer: Self-pay | Admitting: Family Medicine

## 2020-11-11 VITALS — BP 90/49 | HR 78 | Temp 98.1°F | Resp 16 | Wt 212.5 lb

## 2020-11-11 DIAGNOSIS — D751 Secondary polycythemia: Secondary | ICD-10-CM | POA: Diagnosis not present

## 2020-11-11 DIAGNOSIS — D72820 Lymphocytosis (symptomatic): Secondary | ICD-10-CM

## 2020-11-11 DIAGNOSIS — Z1231 Encounter for screening mammogram for malignant neoplasm of breast: Secondary | ICD-10-CM

## 2020-11-11 NOTE — Progress Notes (Signed)
Annette [name], Annette [name]   CLINIC:  Medical Oncology/Hematology  PCP:  Fayrene Helper, MD [address], [place] / [place] [address]  [phone]  REASON FOR VISIT:  Follow-up for lymphocytosis  PRIOR THERAPY: None  CURRENT THERAPY: Observation  INTERVAL HISTORY:  Annette [name], a 60 y.o. female, returns for routine follow-up for her lymphocytosis. Annette [name] was last seen on 04/30/2020.  Today she is accompanied by her husband and she reports feeling poorly. She had a chest wall mass on the left chest removed by Dr. Constance Haw.  She has cut down her smoking to 1 PPD and less. She had her mammogram on 02/03.   REVIEW OF SYSTEMS:  Review of Systems  Constitutional: Positive for appetite change (50%) and fatigue (depleted).  Cardiovascular: Positive for palpitations.  Gastrointestinal: Positive for diarrhea (intermittent) and nausea.  Neurological: Positive for dizziness, headaches and numbness (tingling in feet).  Psychiatric/Behavioral: Positive for depression. The patient is nervous/anxious.   All other systems reviewed and are negative.   PAST MEDICAL/SURGICAL HISTORY:  Past Medical History:  Diagnosis Date  . Arthritis    Phreesia 10/14/2020  . Blood transfusion without reported diagnosis    Phreesia 10/14/2020  . Breast cancer ([place]) 2007   History of  XRT, onTamoxifen  . Bruises easily   . Cancer ([place])    Phreesia 10/14/2020  . Depression   . Depression    Phreesia 10/14/2020  . Diabetes mellitus    prediabetic  . Diabetes mellitus without complication ([place])    Phreesia 10/14/2020  . GERD (gastroesophageal reflux disease)   . Hypertriglyceridemia   . Hypothyroidism    following chemo amnd radiaition for breast cancer, needed replacement short term  . Hypothyroidism (acquired)    replaced x 1 year  . IBS (irritable bowel syndrome)   . Menieres disease    Controlled with triamterene   . Migraines    . Obesity   . Palpitations   . 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[place])   . Sleep apnea 2010   Problems with CPAP   Past Surgical History:  Procedure Laterality Date  . ABDOMINAL HYSTERECTOMY  1998   Benign, fibroids  . 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 APPENDECTOMY  06/12/08  . BIOPSY  07/02/2020   Procedure: BIOPSY;  Surgeon: Harvel Quale, MD;  [facility];  Service: Gastroenterology;;  . BREAST LUMPECTOMY Left 2008  . BREAST RECONSTRUCTION     Left reconstructive  . BREAST SURGERY  2007   Left lumpectomy  . BREAST SURGERY     Mammosite - right side  . CHOLECYSTECTOMY  1985  . COLONOSCOPY WITH PROPOFOL N/A 07/02/2020   Procedure: COLONOSCOPY WITH PROPOFOL;  Surgeon: Harvel Quale, MD;  [facility];  Service: Gastroenterology;  Laterality: N/A;  1045  . ESOPHAGEAL DILATION  12/19/2015   Procedure: ESOPHAGEAL DILATION;  Surgeon: Rogene Houston, MD;  [facility];  Service: Endoscopy;;  . ESOPHAGOGASTRODUODENOSCOPY N/A 12/19/2015   Procedure: ESOPHAGOGASTRODUODENOSCOPY (EGD);  Surgeon: Rogene Houston, MD;  [facility];  Service: Endoscopy;  Laterality: N/A;  11:40  . POLYPECTOMY  07/02/2020   Procedure: POLYPECTOMY;  Surgeon: Montez Morita, Quillian Quince, MD;  [facility];  Service: Gastroenterology;;  . SPLENECTOMY, TOTAL  9/37/9024   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  . THYROIDECTOMY, PARTIAL  11/05/2010   Benign disease  SOCIAL HISTORY:  Social History   Socioeconomic History  . Marital status: Married    Spouse name: Alroy Dust  . Number of children: 2  . Years of education: Not on file  . Highest education level: Not on file  Occupational History  . Not on file  Tobacco Use  . Smoking status: Current Every Day Smoker    Packs/day: 0.50    Years: 40.00    Pack years: 20.00    Types: Cigarettes  . Smokeless tobacco: Never Used  .  Tobacco comment: 1/2 pack a day  Substance and Sexual Activity  . Alcohol use: No    Alcohol/week: 0.0 standard drinks    Comment: Encouraged to quit smoking. She has tried the nicotone gum and patches but did not help  . Drug use: No  . Sexual activity: Yes    Birth control/protection: Surgical  Other Topics Concern  . Not on file  Social History Narrative  . Not on file   Social Determinants of Health   Financial Resource Strain: Medium Risk  . Difficulty of Paying Living Expenses: Somewhat hard  Food Insecurity: No Food Insecurity  . Worried About Charity fundraiser in the Last Year: Never true  . Ran Out of Food in the Last Year: Never true  Transportation Needs: No Transportation Needs  . Lack of Transportation (Medical): No  . Lack of Transportation (Non-Medical): No  Physical Activity: Inactive  . Days of Exercise per Week: 0 days  . Minutes of Exercise per Session: 0 min  Stress: Stress Concern Present  . Feeling of Stress : To some extent  Social Connections: Moderately Integrated  . Frequency of Communication with Friends and Family: Three times a week  . Frequency of Social Gatherings with Friends and Family: Three times a week  . Attends Religious Services: More than 4 times per year  . Active Member of Clubs or Organizations: No  . Attends Archivist Meetings: Never  . Marital Status: Married  Human resources officer Violence: Not At Risk  . Fear of Current or Ex-Partner: No  . Emotionally Abused: No  . Physically Abused: No  . Sexually Abused: No    FAMILY HISTORY:  Family History  Problem Relation Age of Onset  . Diabetes Mother   . Arrhythmia Mother   . Heart disease Father   . Hyperlipidemia Father   . Arrhythmia Father   . Cancer Paternal Grandmother        Breast  . Diabetes Sister   . Diabetes Brother   . Heart disease Brother     CURRENT MEDICATIONS:  Current Outpatient Medications  Medication Sig Dispense Refill  . temazepam  (RESTORIL) 15 MG capsule Take 1 capsule (15 mg total) by mouth at bedtime. 30 capsule 5  . aspirin (ASPIRIN 81) 81 MG EC tablet 1 tablet    . cyclobenzaprine (FLEXERIL) 10 MG tablet 1 tablet at bedtime as needed    . Dulaglutide (TRULICITY) 1.5 JH/4.1DE SOPN Inject 0.5 mLs (1.5 mg total) into the skin once a week. 4 pen 11  . Ergocalciferol (VITAMIN D2) 50 MCG (2000 UT) TABS 1 tablet    . fluconazole (DIFLUCAN) 150 MG tablet TAKE ONE TABLET ONCE DAILY , AS NEEDED, FOR VAGINAL ITCH ASSOCIATED WITH ANTIBIOTIC USE 2 tablet 0  . glipiZIDE (GLUCOTROL XL) 5 MG 24 hr tablet Take 1 tablet (5 mg total) by mouth daily with breakfast. 90 tablet 3  . glucose blood test strip See admin instructions.    Marland Kitchen  insulin degludec (TRESIBA FLEXTOUCH) 200 UNIT/ML FlexTouch Pen Inject 50 Units into the skin daily. 18 mL 3  . Insulin Pen Needle (PEN NEEDLES) 31G X 6 MM MISC See admin instructions.    Marland Kitchen LORazepam (ATIVAN) 1 MG tablet 1 tablet at bedtime as needed    . metoprolol tartrate (LOPRESSOR) 25 MG tablet 1 tablet with food    . Metoprolol Tartrate 37.5 MG TABS TAKE 1 TABLET BY MOUTH TWICE DAILY 180 tablet 3  . naproxen (NAPROSYN) 500 MG tablet 1 tablet with food or milk as needed    . omeprazole (PRILOSEC) 40 MG capsule Take 1 capsule (40 mg total) by mouth daily. 30 capsule 5  . ONETOUCH VERIO test strip USE AS DIRECTED FOUR TIMES DAILY 150 strip 5  . promethazine-dextromethorphan (PROMETHAZINE-DM) 6.25-15 MG/5ML syrup Take 2.5 mLs by mouth 3 (three) times daily as needed for cough. 118 mL 0  . rosuvastatin (CRESTOR) 20 MG tablet 1 tablet    . traZODone (DESYREL) 100 MG tablet 1 tablet at bedtime.    . triamterene-hydrochlorothiazide (MAXZIDE-25) 37.5-25 MG tablet 1 tablet in the morning    . venlafaxine XR (EFFEXOR-XR) 75 MG 24 hr capsule 1 capsule with food     No current facility-administered medications for this visit.    ALLERGIES:  Allergies  Allergen Reactions  . Penicillins Swelling    Has patient  had a PCN reaction causing immediate rash, facial/tongue/throat swelling, SOB or lightheadedness with hypotension: Yes Has patient had a PCN reaction causing severe rash involving mucus membranes or skin necrosis: No Has patient had a PCN reaction that required hospitalization: No  Has patient had a PCN reaction occurring within the last 10 years: No If all of the above answers are "NO", then may proceed with Cephalosporin use.   Of face and tongue.  . Metformin And Related Diarrhea and Nausea Only    Pt to discontinue med due to intolerance, cramps and diarrhea  . Other Swelling and Other (See Comments)  . Wound Dressing Adhesive Itching    Dermabond and other surgical glues for incisions  . Empagliflozin     Other reaction(s): Unknown  . Metformin Other (See Comments)  . Penicillin G Other (See Comments)  . Statins Other (See Comments)    Major concerns re safety  . Zetia [Ezetimibe] Other (See Comments)    Feels funny    PHYSICAL EXAM:  Performance status (ECOG): 1 - Symptomatic but completely ambulatory  Vitals:   11/11/20 1555  BP: (!) 90/49  Pulse: 78  Resp: 16  Temp: 98.1 F (36.7 C)  SpO2: 96%   Wt Readings from Last 3 Encounters:  11/11/20 212 lb 8.4 oz (96.4 kg)  10/14/20 215 lb (97.5 kg)  10/14/20 215 lb (97.5 kg)   Physical Exam Vitals reviewed.  Constitutional:      Appearance: Normal appearance. She is obese.  Cardiovascular:     Rate and Rhythm: Normal rate and regular rhythm.     Pulses: Normal pulses.     Heart sounds: Normal heart sounds.  Pulmonary:     Effort: Pulmonary effort is normal.     Breath sounds: Normal breath sounds.  Neurological:     General: No focal deficit present.     Mental Status: She is alert and oriented to person, place, and time.  Psychiatric:        Mood and Affect: Mood normal.        Behavior: Behavior normal.     LABORATORY DATA:  I have reviewed the labs as listed.  CBC Latest Ref Rng & Units 11/04/2020  03/12/2020 02/20/2020  WBC 4.0 - 10.5 K/uL 11.1(H) 10.5 12.3(H)  Hemoglobin 12.0 - 15.0 g/dL 16.1(H) 16.3(H) 17.6(H)  Hematocrit 36.0 - 46.0 % 48.4(H) 49.3(H) 51.8(H)  Platelets 150 - 400 K/uL 364 316 363   CMP Latest Ref Rng & Units 10/07/2020 06/30/2020 06/05/2020  Glucose 65 - 99 mg/dL 118(H) 116(H) 73  BUN 8 - 27 mg/dL 21 15 13   Creatinine 0.57 - 1.00 mg/dL 0.85 1.06(H) 0.94  Sodium 134 - 144 mmol/L 142 138 141  Potassium 3.5 - 5.2 mmol/L 4.2 3.5 4.0  Chloride 96 - 106 mmol/L 102 102 104  CO2 20 - 29 mmol/L 27 26 23   Calcium 8.7 - 10.3 mg/dL 10.3 9.2 10.1  Total Protein 6.0 - 8.5 g/dL 6.8 - -  Total Bilirubin 0.0 - 1.2 mg/dL 0.3 - -  Alkaline Phos 44 - 121 IU/L 77 - -  AST 0 - 40 IU/L 16 - -  ALT 0 - 32 IU/L 18 - -      Component Value Date/Time   RBC 4.87 11/04/2020 1515   MCV 99.4 11/04/2020 1515   MCV 99 (H) 02/20/2020 1157   MCH 33.1 11/04/2020 1515   MCHC 33.3 11/04/2020 1515   RDW 13.4 11/04/2020 1515   RDW 12.9 02/20/2020 1157   LYMPHSABS 4.0 11/04/2020 1515   LYMPHSABS 4.9 (H) 02/20/2020 1157   MONOABS 0.7 11/04/2020 1515   EOSABS 0.2 11/04/2020 1515   EOSABS 0.3 02/20/2020 1157   BASOSABS 0.1 11/04/2020 1515   BASOSABS 0.1 02/20/2020 1157   Lab Results  Component Value Date   LDH 142 11/04/2020   LDH 157 03/12/2020   LDH 142 11/01/2019    DIAGNOSTIC IMAGING:  I have independently reviewed the scans and discussed with the patient. MM 3D SCREEN BREAST BILATERAL  Result Date: 11/06/2020 CLINICAL DATA:  Screening. EXAM: DIGITAL SCREENING BILATERAL MAMMOGRAM WITH TOMO AND CAD COMPARISON:  Previous exam(s). ACR Breast Density Category b: There are scattered areas of fibroglandular density. FINDINGS: There are no findings suspicious for malignancy. The images were evaluated with computer-aided detection. IMPRESSION: No mammographic evidence of malignancy. A result letter of this screening mammogram will be mailed directly to the patient. RECOMMENDATION: Screening  mammogram in one year. (Code:SM-B-01Y) BI-RADS CATEGORY  1: Negative. Electronically Signed   By: Dorise Bullion III M.D   On: 11/06/2020 12:07     ASSESSMENT:  1. Leukocytosis: -CBC on 02/20/2020 shows white count 12.3. She also had elevated hemoglobin of 17.6 and hematocrit of 51.8. Differential showed elevated lymphocytes and monocytes. -She had intermittent leukocytosis for the past few years. 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 -She does not have any B symptoms. No steroid use. No chronic infections. -She is a current active smoker, 1 and half pack per day since age 13. -Flow cytometry was negative.  JAK2 V617F reflex testing and BCR/ABL was negative.  2. Stage I left breast cancer, ER positive, HER-2 positive: -Diagnosed on 12/10/2005, status post lumpectomy. -Taxotere and Cytoxan plus Herceptin for 6 cycles followed by 1 year of Herceptin. -5 years of tamoxifen completed in November 2012.   PLAN:  1.  JAK2 and BCR/ABL negativeleukocytosis: -She is continuing to smoke less than a pack a day. -She also has splenectomy which is likely contributing to leukocytosis. -Labs from 11/04/2020 shows white count 11.1.  Differential is normal.  LDH was normal. -  RTC 1 year with labs and follow-up.  2.  JAK2 negativeerythrocytosis: -Hemoglobin today 16.1 and hematocrit 48.4. -This is from smoking.  3. Left breast cancer: -Mammogram on 11/05/2020 was BI-RADS Category 1 reviewed by me.  Orders placed this encounter:  Orders Placed This Encounter  Procedures  . CBC with Differential/Platelet  . Lactate dehydrogenase     Derek Jack, MD [place] [phone]   I, Milinda Antis, am acting as a scribe for Dr. Sanda Linger.  I, Derek Jack MD, have reviewed the above documentation for accuracy and completeness, and I agree with the above.

## 2020-11-11 NOTE — Patient Instructions (Signed)
[facility] at [facility] Discharge Instructions  You were seen today by Dr. Katragadda. He went over your recent results and scans. Dr. Katragadda will see you back in 1 year for labs and follow up.   Thank you for choosing [facility] at [facility] to provide your oncology and hematology care.  To afford each patient quality time with our provider, please arrive at least 15 minutes before your scheduled appointment time.   If you have a lab appointment with the Cancer Center please come in thru the Main Entrance and check in at the main information desk  You need to re-schedule your appointment should you arrive 10 or more minutes late.  We strive to give you quality time with our providers, and arriving late affects you and other patients whose appointments are after yours.  Also, if you no show three or more times for appointments you may be dismissed from the clinic at the providers discretion.     Again, thank you for choosing [facility].  Our hope is that these requests will decrease the amount of time that you wait before being seen by our physicians.       _____________________________________________________________  Should you have questions after your visit to [facility], please contact our office at [phone] between the hours of 8:00 a.m. and 4:30 p.m.  Voicemails left after 4:00 p.m. will not be returned until the following business day.  For prescription refill requests, have your pharmacy contact our office and allow 72 hours.    Cancer Center Support Programs:   > Cancer Support Group  2nd Tuesday of the month 1pm-2pm, Journey Room   

## 2020-11-13 ENCOUNTER — Ambulatory Visit: Payer: BC Managed Care – PPO | Admitting: Internal Medicine

## 2020-11-17 ENCOUNTER — Ambulatory Visit: Payer: BC Managed Care – PPO | Admitting: Vascular Surgery

## 2020-11-17 ENCOUNTER — Other Ambulatory Visit: Payer: Self-pay

## 2020-11-17 ENCOUNTER — Encounter: Payer: Self-pay | Admitting: Vascular Surgery

## 2020-11-17 VITALS — BP 78/47 | HR 78 | Temp 97.3°F | Resp 16 | Ht 65.5 in | Wt 213.0 lb

## 2020-11-17 DIAGNOSIS — M5137 Other intervertebral disc degeneration, lumbosacral region: Secondary | ICD-10-CM

## 2020-11-17 NOTE — Progress Notes (Signed)
[facility] [place]  Patient name: Annette [name] MRN: [id number] DOB: 12/05/1960 Sex: female  REASON FOR CONSULT: Discuss anterior exposure for L4-5 and L5-S1 disc fusion with Dr. Christella Noa.  HPI: Annette [name] is a 60 y.o. female, who is here today for discussion of anterior exposure for spine surgery.  She is here today with her husband.  She has many year history of progressively severe degenerative disc disease.  She has failed conservative treatment and is now planned for 2 level spine exposure.  In reviewing Dr. Hewitt Shorts notes, the preference would be for anterior approach to L4-5 and L5-S1.  She does have pain in her back and this extends primarily down her right leg.  She does have prior history of multiple abdominal surgeries.  She underwent partial vaginal hysterectomy.  She had removal of an mass on her left adrenal gland and had injury of the spleen requiring splenectomy.  Also underwent appendectomy and also cholecystectomy through an upper midline incision.  Past Medical History:  Diagnosis Date  . Arthritis    Phreesia 10/14/2020  . Blood transfusion without reported diagnosis    Phreesia 10/14/2020  . Breast cancer ([place]) 2007   History of  XRT, onTamoxifen  . Bruises easily   . Cancer ([place])    Phreesia 10/14/2020  . Depression   . Depression    Phreesia 10/14/2020  . Diabetes mellitus    prediabetic  . Diabetes mellitus without complication ([place])    Phreesia 10/14/2020  . GERD (gastroesophageal reflux disease)   . Hypertriglyceridemia   . Hypothyroidism    following chemo amnd radiaition for breast cancer, needed replacement short term  . Hypothyroidism (acquired)    replaced x 1 year  . IBS (irritable bowel syndrome)   . Menieres disease    Controlled with triamterene   . Migraines   . Obesity   . Palpitations   . 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[place])   . Sleep apnea 2010   Problems with CPAP     Family History  Problem Relation Age of Onset  . Diabetes Mother   . Arrhythmia Mother   . Heart disease Father   . Hyperlipidemia Father   . Arrhythmia Father   . Cancer Paternal Grandmother        Breast  . Diabetes Sister   . Diabetes Brother   . Heart disease Brother     SOCIAL HISTORY: Social History   Socioeconomic History  . Marital status: Married    Spouse name: Alroy Dust  . Number of children: 2  . Years of education: Not on file  . Highest education level: Not on file  Occupational History  . Not on file  Tobacco Use  . Smoking status: Current Every Day Smoker    Packs/day: 0.50    Years: 40.00    Pack years: 20.00    Types: Cigarettes  . Smokeless tobacco: Never Used  . Tobacco comment: 1/2 pack a day  Substance and Sexual Activity  . Alcohol use: No    Alcohol/week: 0.0 standard drinks    Comment: Encouraged to quit smoking. She has tried the nicotone gum and patches but did not help  . Drug use: No  . Sexual activity: Yes    Birth control/protection: Surgical  Other Topics Concern  . Not on file  Social History Narrative  . Not on file   Social Determinants of Health   Financial Resource Strain: Medium Risk  . Difficulty of Paying Living  Expenses: Somewhat hard  Food Insecurity: No Food Insecurity  . Worried About Charity fundraiser in the Last Year: Never true  . Ran Out of Food in the Last Year: Never true  Transportation Needs: No Transportation Needs  . Lack of Transportation (Medical): No  . Lack of Transportation (Non-Medical): No  Physical Activity: Inactive  . Days of Exercise per Week: 0 days  . Minutes of Exercise per Session: 0 min  Stress: Stress Concern Present  . Feeling of Stress : To some extent  Social Connections: Moderately Integrated  . Frequency of Communication with Friends and Family: Three times a week  . Frequency of Social Gatherings with Friends and Family: Three times a week  . Attends Religious Services:  More than 4 times per year  . Active Member of Clubs or Organizations: No  . Attends Archivist Meetings: Never  . Marital Status: Married  Human resources officer Violence: Not At Risk  . Fear of Current or Ex-Partner: No  . Emotionally Abused: No  . Physically Abused: No  . Sexually Abused: No    Allergies  Allergen Reactions  . Penicillins Swelling    Has patient had a PCN reaction causing immediate rash, facial/tongue/throat swelling, SOB or lightheadedness with hypotension: Yes Has patient had a PCN reaction causing severe rash involving mucus membranes or skin necrosis: No Has patient had a PCN reaction that required hospitalization: No  Has patient had a PCN reaction occurring within the last 10 years: No If all of the above answers are "NO", then may proceed with Cephalosporin use.   Of face and tongue.  . Metformin And Related Diarrhea and Nausea Only    Pt to discontinue med due to intolerance, cramps and diarrhea  . Other Swelling and Other (See Comments)  . Wound Dressing Adhesive Itching    Dermabond and other surgical glues for incisions  . Empagliflozin     Other reaction(s): Unknown  . Metformin Other (See Comments)  . Penicillin G Other (See Comments)  . Statins Other (See Comments)    Major concerns re safety  . Zetia [Ezetimibe] Other (See Comments)    Feels funny    Current Outpatient Medications  Medication Sig Dispense Refill  . aspirin 81 MG EC tablet 1 tablet    . cyclobenzaprine (FLEXERIL) 10 MG tablet 1 tablet at bedtime as needed    . Dulaglutide (TRULICITY) 1.5 ZO/1.0RU SOPN Inject 0.5 mLs (1.5 mg total) into the skin once a week. 4 pen 11  . Ergocalciferol (VITAMIN D2) 50 MCG (2000 UT) TABS 1 tablet    . fluconazole (DIFLUCAN) 150 MG tablet TAKE ONE TABLET ONCE DAILY , AS NEEDED, FOR VAGINAL ITCH ASSOCIATED WITH ANTIBIOTIC USE 2 tablet 0  . glipiZIDE (GLUCOTROL XL) 5 MG 24 hr tablet Take 1 tablet (5 mg total) by mouth daily with breakfast.  90 tablet 3  . glucose blood test strip See admin instructions.    . insulin degludec (TRESIBA FLEXTOUCH) 200 UNIT/ML FlexTouch Pen Inject 50 Units into the skin daily. 18 mL 3  . Insulin Pen Needle (PEN NEEDLES) 31G X 6 MM MISC See admin instructions.    Marland Kitchen LORazepam (ATIVAN) 1 MG tablet 1 tablet at bedtime as needed    . metoprolol tartrate (LOPRESSOR) 25 MG tablet 1 tablet with food    . Metoprolol Tartrate 37.5 MG TABS TAKE 1 TABLET BY MOUTH TWICE DAILY 180 tablet 3  . naproxen (NAPROSYN) 500 MG tablet 1 tablet with food  or milk as needed    . omeprazole (PRILOSEC) 40 MG capsule Take 1 capsule (40 mg total) by mouth daily. 30 capsule 5  . ONETOUCH VERIO test strip USE AS DIRECTED FOUR TIMES DAILY 150 strip 5  . rosuvastatin (CRESTOR) 20 MG tablet 1 tablet    . temazepam (RESTORIL) 15 MG capsule Take 1 capsule (15 mg total) by mouth at bedtime. 30 capsule 5  . traZODone (DESYREL) 100 MG tablet 1 tablet at bedtime.    . triamterene-hydrochlorothiazide (MAXZIDE-25) 37.5-25 MG tablet 1 tablet in the morning    . venlafaxine XR (EFFEXOR-XR) 75 MG 24 hr capsule 1 capsule with food     No current facility-administered medications for this visit.    REVIEW OF SYSTEMS:  [X]  denotes positive finding, [ ]  denotes negative finding Cardiac  Comments:  Chest pain or chest pressure:    Shortness of breath upon exertion:    Short of breath when lying flat:    Irregular heart rhythm:        Vascular    Pain in calf, thigh, or hip brought on by ambulation: x  neurogenic  Pain in feet at night that wakes you up from your sleep:     Blood clot in your veins:    Leg swelling:         Pulmonary    Oxygen at home:    Productive cough:     Wheezing:         Neurologic    Sudden weakness in arms or legs:     Sudden numbness in arms or legs:     Sudden onset of difficulty speaking or slurred speech:    Temporary loss of vision in one eye:     Problems with dizziness:  x       Gastrointestinal     Blood in stool:     Vomited blood:         Genitourinary    Burning when urinating:     Blood in urine:        Psychiatric    Major depression:         Hematologic    Bleeding problems:    Problems with blood clotting too easily:        Skin    Rashes or ulcers:        Constitutional    Fever or chills:      PHYSICAL EXAM: Vitals:   11/17/20 1514  BP: (!) 78/47  Pulse: 78  Resp: 16  Temp: (!) 97.3 F (36.3 C)  TempSrc: Other (Comment)  SpO2: 94%  Weight: 213 lb (96.6 kg)  Height: 5' 5.5" (1.664 m)    GENERAL: The patient is a well-nourished female, in no acute distress. The vital signs are documented above. CARDIOVASCULAR: 2+ radial pulses, 2+ femoral pulses, 2+ popliteal and 2+ dorsalis pedis pulses bilaterally Abdomen: Upper midline incision from her prior cholecystectomy PULMONARY: There is good air exchange  MUSCULOSKELETAL: There are no major deformities or cyanosis. NEUROLOGIC: No focal weakness or paresthesias are detected. SKIN: There are no ulcers or rashes noted. PSYCHIATRIC: The patient has a normal affect.  DATA:  CT scan of her lumbar spine 09/09/2020 was reviewed.  This shows no evidence of calcification in her aorta iliac segments.  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  Explained that Dr. Cyndy Freeze has  determined her best treatment for her back pain is to level spine fusion.  Explained the exposure to include left paramedian incision and mobilization of the rectus muscle.  Explained retroperitoneal exposure with mobilization of intraperitoneal contents to the right and mobilization of the left ureter to the right.  I described mobilization of the arterial venous structures overlying the spine.  I discussed potential injury to all these and particularly emphasized the risk of major venous injury.  I feel that she is acceptable risk for  surgery. She is not morbidly obese She has had multiple abdominal surgeries but no major pelvic surgery She has no evidence of atherosclerotic disease in her aortoiliac segments.  We will plan surgery as scheduled on 12/03/2020   Rosetta Posner, MD [facility] [phone] Pager [phone]  Note: Portions of this report may have been transcribed using voice recognition software.  Every effort has been made to ensure accuracy; however, inadvertent computerized transcription errors may still be present.

## 2020-11-21 ENCOUNTER — Other Ambulatory Visit: Payer: Self-pay

## 2020-11-27 ENCOUNTER — Encounter (INDEPENDENT_AMBULATORY_CARE_PROVIDER_SITE_OTHER): Payer: Self-pay | Admitting: *Deleted

## 2020-12-01 ENCOUNTER — Encounter (HOSPITAL_COMMUNITY): Payer: Self-pay

## 2020-12-01 ENCOUNTER — Other Ambulatory Visit (HOSPITAL_COMMUNITY)
Admission: RE | Admit: 2020-12-01 | Discharge: 2020-12-01 | Disposition: A | Discharge status: Home / Self Care | Payer: BC Managed Care – PPO | Source: Ambulatory Visit | Attending: Neurosurgery | Admitting: Neurosurgery

## 2020-12-01 ENCOUNTER — Other Ambulatory Visit: Payer: Self-pay

## 2020-12-01 ENCOUNTER — Encounter (HOSPITAL_COMMUNITY)
Admission: RE | Admit: 2020-12-01 | Discharge: 2020-12-01 | Disposition: A | Discharge status: Home / Self Care | Payer: BC Managed Care – PPO | Source: Ambulatory Visit | Attending: Neurosurgery | Admitting: Neurosurgery

## 2020-12-01 DIAGNOSIS — Z01818 Encounter for other preprocedural examination: Secondary | ICD-10-CM | POA: Diagnosis present

## 2020-12-01 DIAGNOSIS — Z01812 Encounter for preprocedural laboratory examination: Secondary | ICD-10-CM | POA: Insufficient documentation

## 2020-12-01 DIAGNOSIS — I10 Essential (primary) hypertension: Secondary | ICD-10-CM | POA: Diagnosis not present

## 2020-12-01 DIAGNOSIS — Z20822 Contact with and (suspected) exposure to covid-19: Secondary | ICD-10-CM | POA: Insufficient documentation

## 2020-12-01 LAB — CBC
HCT: 45.4 % (ref 36.0–46.0)
Hemoglobin: 15.3 g/dL — ABNORMAL HIGH (ref 12.0–15.0)
MCH: 33.2 pg (ref 26.0–34.0)
MCHC: 33.7 g/dL (ref 30.0–36.0)
MCV: 98.5 fL (ref 80.0–100.0)
Platelets: 331 10*3/uL (ref 150–400)
RBC: 4.61 MIL/uL (ref 3.87–5.11)
RDW: 13.4 % (ref 11.5–15.5)
WBC: 12.2 10*3/uL — ABNORMAL HIGH (ref 4.0–10.5)
nRBC: 0.2 % (ref 0.0–0.2)

## 2020-12-01 LAB — BASIC METABOLIC PANEL
Anion gap: 10 (ref 5–15)
BUN: 15 mg/dL (ref 8–23)
CO2: 25 mmol/L (ref 22–32)
Calcium: 9.5 mg/dL (ref 8.9–10.3)
Chloride: 106 mmol/L (ref 98–111)
Creatinine, Ser: 1.07 mg/dL — ABNORMAL HIGH (ref 0.44–1.00)
GFR, Estimated: 59 mL/min — ABNORMAL LOW (ref >60)
Glucose, Bld: 81 mg/dL (ref 70–99)
Potassium: 3.9 mmol/L (ref 3.5–5.1)
Sodium: 141 mmol/L (ref 135–145)

## 2020-12-01 LAB — SURGICAL PCR SCREEN
MRSA, PCR: NEGATIVE
Staphylococcus aureus: NEGATIVE

## 2020-12-01 LAB — TYPE AND SCREEN
ABO/RH(D): A POS
Antibody Screen: NEGATIVE

## 2020-12-01 LAB — GLUCOSE, CAPILLARY: Glucose-Capillary: 73 mg/dL (ref 70–99)

## 2020-12-01 NOTE — Progress Notes (Signed)
Surgical Instructions    Your procedure is scheduled on 12/03/20.  Report to [facility] Main Entrance "A" at 6:30 A.M., then check in with the Admitting office.  Call this number if you have problems the morning of surgery:  [phone]   If you have any questions prior to your surgery date call [phone]: Open Monday-Friday 8am-4pm    Remember:  Do not eat OR drink after midnight the night before your surgery     Take these medicines the morning of surgery with A SIP OF WATER: Metoprolol Tartrate  omeprazole (PRILOSEC) rosuvastatin (CRESTOR)  venlafaxine XR (EFFEXOR-XR)   IF NEEDED: cyclobenzaprine (FLEXERIL) LORazepam (ATIVAN)  As of today, STOP taking any Aspirin (unless otherwise instructed by your surgeon) Aleve, Naproxen, Ibuprofen, Motrin, Advil, Goody's, BC's, all herbal medications, fish oil, and all vitamins.   WHAT DO I DO ABOUT MY DIABETES MEDICATION?   Marland Kitchen Do not take oral diabetes medicines (pills) the morning of surgery.  . THE NIGHT BEFORE SURGERY, take __50%__ units of __Tresiba___insulin.     . The day of surgery, do not take other diabetes injectables, including Byetta (exenatide), Bydureon (exenatide ER), Victoza (liraglutide), or Trulicity (dulaglutide).  . If your CBG is greater than 220 mg/dL, you may take  of your sliding scale (correction) dose of insulin.   HOW TO MANAGE YOUR DIABETES BEFORE AND AFTER SURGERY  Why is it important to control my blood sugar before and after surgery? . Improving blood sugar levels before and after surgery helps healing and can limit problems. . A way of improving blood sugar control is eating a healthy diet by: o  Eating less sugar and carbohydrates o  Increasing activity/exercise o  Talking with your doctor about reaching your blood sugar goals . High blood sugars (greater than 180 mg/dL) can raise your risk of infections and slow your recovery, so you will need to focus on controlling your diabetes during the  weeks before surgery. . Make sure that the doctor who takes care of your diabetes knows about your planned surgery including the date and location.  How do I manage my blood sugar before surgery? . Check your blood sugar at least 4 times a day, starting 2 days before surgery, to make sure that the level is not too high or low. . Check your blood sugar the morning of your surgery when you wake up and every 2 hours until you get to the Short Stay unit. o If your blood sugar is less than 70 mg/dL, you will need to treat for low blood sugar: - Do not take insulin. - Treat a low blood sugar (less than 70 mg/dL) with  cup of clear juice (cranberry or apple), 4 glucose tablets, OR glucose gel. - Recheck blood sugar in 15 minutes after treatment (to make sure it is greater than 70 mg/dL). If your blood sugar is not greater than 70 mg/dL on recheck, call [phone] for further instructions. . Report your blood sugar to the short stay nurse when you get to Short Stay.  . If you are admitted to the hospital after surgery: o Your blood sugar will be checked by the staff and you will probably be given insulin after surgery (instead of oral diabetes medicines) to make sure you have good blood sugar levels. o The goal for blood sugar control after surgery is 80-180 mg/dL.                      Do not  wear jewelry, make up, or nail polish            Do not wear lotions, powders, perfumes or deodorant.            Do not shave 48 hours prior to surgery.              Do not bring valuables to the hospital.            [facility] is not responsible for any belongings or valuables.  Do NOT Smoke (Tobacco/Vaping) or drink Alcohol 24 hours prior to your procedure If you use a CPAP at night, you may bring all equipment for your overnight stay.   Contacts, glasses, dentures or bridgework may not be worn into surgery, please bring cases for these belongings   For patients admitted to the hospital, discharge time  will be determined by your treatment team.   Patients discharged the day of surgery will not be allowed to drive home, and someone needs to stay with them for 24 hours.    Special instructions:   [place]- Preparing For Surgery  Before surgery, you can play an important role. Because skin is not sterile, your skin needs to be as free of germs as possible. You can reduce the number of germs on your skin by washing with CHG (chlorahexidine gluconate) Soap before surgery.  CHG is an antiseptic cleaner which kills germs and bonds with the skin to continue killing germs even after washing.    Oral Hygiene is also important to reduce your risk of infection.  Remember - BRUSH YOUR TEETH THE MORNING OF SURGERY WITH YOUR REGULAR TOOTHPASTE  Please do not use if you have an allergy to CHG or antibacterial soaps. If your skin becomes reddened/irritated stop using the CHG.  Do not shave (including legs and underarms) for at least 48 hours prior to first CHG shower. It is OK to shave your face.  Please follow these instructions carefully.   1. Shower the NIGHT BEFORE SURGERY and the MORNING OF SURGERY  2. If you chose to wash your hair, wash your hair first as usual with your normal shampoo.  3. After you shampoo, rinse your hair and body thoroughly to remove the shampoo.  4. Wash Face and genitals (private parts) with your normal soap.   5.  Shower the NIGHT BEFORE SURGERY and the MORNING OF SURGERY with CHG Soap.   6. Use CHG Soap as you would any other liquid soap. You can apply CHG directly to the skin and wash gently with a scrungie or a clean washcloth.   7. Apply the CHG Soap to your body ONLY FROM THE NECK DOWN.  Do not use on open wounds or open sores. Avoid contact with your eyes, ears, mouth and genitals (private parts). Wash Face and genitals (private parts)  with your normal soap.   8. Wash thoroughly, paying special attention to the area where your surgery will be  performed.  9. Thoroughly rinse your body with warm water from the neck down.  10. DO NOT shower/wash with your normal soap after using and rinsing off the CHG Soap.  11. Pat yourself dry with a CLEAN TOWEL.  12. Wear CLEAN PAJAMAS to bed the night before surgery  13. Place CLEAN SHEETS on your bed the night before your surgery  14. DO NOT SLEEP WITH PETS.   Day of Surgery: Wear Clean/Comfortable clothing the morning of surgery Do not apply any deodorants/lotions.   Remember  to brush your teeth WITH YOUR REGULAR TOOTHPASTE.   Please read over the following fact sheets that you were given.

## 2020-12-01 NOTE — Progress Notes (Signed)
PCP:  [name], MD Cardiologist: Eleonore Chiquito, MD  EKG: 12/01/20 CXR: 12/01/20 ECHO: 06/18/19 Stress Test: 12/16/11 Cardiac Cath: denies  Fasting Blood Sugar- 80-130 Checks Blood Sugar__2-3_ times a day  OSA/CPAP:  No  ASA: Patient to call office to see if needs to stop. Blood Thinners:  No  Covid test 12/01/20  Anesthesia Review:  No.  Patient has cardiologist because of family history.   Patient denies shortness of breath, fever, cough, and chest pain at PAT appointment.  Patient verbalized understanding of instructions provided today at the PAT appointment.  Patient asked to review instructions at home and day of surgery.

## 2020-12-02 LAB — SARS CORONAVIRUS 2 (TAT 6-24 HRS): SARS Coronavirus 2: NEGATIVE

## 2020-12-02 NOTE — Anesthesia Preprocedure Evaluation (Addendum)
Anesthesia Evaluation  Patient identified by MRN, date of birth, ID band Patient awake    Reviewed: Allergy & Precautions, NPO status , Patient's Chart, lab work & pertinent test results  Airway Mallampati: II  TM Distance: >3 FB Neck ROM: Full    Dental  (+) Caps, Missing   Pulmonary sleep apnea and Continuous Positive Airway Pressure Ventilation , Current SmokerPatient did not abstain from smoking.,    Pulmonary exam normal breath sounds clear to auscultation       Cardiovascular hypertension, Pt. on home beta blockers Normal cardiovascular exam Rhythm:Regular Rate:Normal  ECG: NSR, rate 70   Neuro/Psych  Headaches, PSYCHIATRIC DISORDERS Anxiety Depression    GI/Hepatic Neg liver ROS, GERD  Medicated and Controlled,IBS (irritable bowel syndrome)   Endo/Other  diabetes, Insulin Dependent, Oral Hypoglycemic Agents  Renal/GU negative Renal ROS     Musculoskeletal  (+) Arthritis ,   Abdominal (+) + obese,   Peds  Hematology HLD   Anesthesia Other Findings lumbosacral degenerative disc disease  Reproductive/Obstetrics                            Anesthesia Physical Anesthesia Plan  ASA: III  Anesthesia Plan: General   Post-op Pain Management:    Induction: Intravenous  PONV Risk Score and Plan: 2 and Ondansetron, Dexamethasone, Midazolam and Treatment may vary due to age or medical condition  Airway Management Planned: Oral ETT  Additional Equipment: Arterial line  Intra-op Plan:   Post-operative Plan: Extubation in OR  Informed Consent: I have reviewed the patients History and Physical, chart, labs and discussed the procedure including the risks, benefits and alternatives for the proposed anesthesia with the patient or authorized representative who has indicated his/her understanding and acceptance.     Dental advisory given  Plan Discussed with: CRNA  Anesthesia Plan  Comments:        Anesthesia Quick Evaluation

## 2020-12-03 ENCOUNTER — Inpatient Hospital Stay (HOSPITAL_COMMUNITY)
Admission: RE | Admit: 2020-12-03 | Discharge: 2020-12-06 | DRG: 460 | Disposition: A | Discharge status: Home / Self Care | Payer: BC Managed Care – PPO | Attending: Neurosurgery | Admitting: Neurosurgery

## 2020-12-03 ENCOUNTER — Inpatient Hospital Stay (HOSPITAL_COMMUNITY): Payer: BC Managed Care – PPO | Admitting: Anesthesiology

## 2020-12-03 ENCOUNTER — Inpatient Hospital Stay (HOSPITAL_COMMUNITY): Payer: BC Managed Care – PPO

## 2020-12-03 ENCOUNTER — Encounter (HOSPITAL_COMMUNITY): Payer: Self-pay | Admitting: Neurosurgery

## 2020-12-03 ENCOUNTER — Other Ambulatory Visit: Payer: Self-pay

## 2020-12-03 ENCOUNTER — Encounter (HOSPITAL_COMMUNITY)
Admission: RE | Disposition: A | Discharge status: Home / Self Care | Payer: Self-pay | Source: Home / Self Care | Attending: Neurosurgery

## 2020-12-03 DIAGNOSIS — M5136 Other intervertebral disc degeneration, lumbar region: Secondary | ICD-10-CM | POA: Diagnosis present

## 2020-12-03 DIAGNOSIS — I10 Essential (primary) hypertension: Secondary | ICD-10-CM | POA: Diagnosis present

## 2020-12-03 DIAGNOSIS — Z888 Allergy status to other drugs, medicaments and biological substances status: Secondary | ICD-10-CM

## 2020-12-03 DIAGNOSIS — Z7982 Long term (current) use of aspirin: Secondary | ICD-10-CM

## 2020-12-03 DIAGNOSIS — G4730 Sleep apnea, unspecified: Secondary | ICD-10-CM | POA: Diagnosis present

## 2020-12-03 DIAGNOSIS — E119 Type 2 diabetes mellitus without complications: Secondary | ICD-10-CM | POA: Diagnosis present

## 2020-12-03 DIAGNOSIS — Z90710 Acquired absence of both cervix and uterus: Secondary | ICD-10-CM | POA: Diagnosis not present

## 2020-12-03 DIAGNOSIS — E039 Hypothyroidism, unspecified: Secondary | ICD-10-CM | POA: Diagnosis present

## 2020-12-03 DIAGNOSIS — Z419 Encounter for procedure for purposes other than remedying health state, unspecified: Secondary | ICD-10-CM

## 2020-12-03 DIAGNOSIS — E785 Hyperlipidemia, unspecified: Secondary | ICD-10-CM | POA: Diagnosis present

## 2020-12-03 DIAGNOSIS — M5137 Other intervertebral disc degeneration, lumbosacral region: Secondary | ICD-10-CM | POA: Diagnosis present

## 2020-12-03 DIAGNOSIS — Z880 Allergy status to penicillin: Secondary | ICD-10-CM

## 2020-12-03 DIAGNOSIS — Z9049 Acquired absence of other specified parts of digestive tract: Secondary | ICD-10-CM

## 2020-12-03 DIAGNOSIS — M51369 Other intervertebral disc degeneration, lumbar region without mention of lumbar back pain or lower extremity pain: Secondary | ICD-10-CM | POA: Diagnosis present

## 2020-12-03 DIAGNOSIS — K219 Gastro-esophageal reflux disease without esophagitis: Secondary | ICD-10-CM | POA: Diagnosis present

## 2020-12-03 DIAGNOSIS — Z20822 Contact with and (suspected) exposure to covid-19: Secondary | ICD-10-CM | POA: Diagnosis present

## 2020-12-03 DIAGNOSIS — Z923 Personal history of irradiation: Secondary | ICD-10-CM

## 2020-12-03 DIAGNOSIS — K589 Irritable bowel syndrome without diarrhea: Secondary | ICD-10-CM | POA: Diagnosis present

## 2020-12-03 DIAGNOSIS — E669 Obesity, unspecified: Secondary | ICD-10-CM | POA: Diagnosis present

## 2020-12-03 DIAGNOSIS — F419 Anxiety disorder, unspecified: Secondary | ICD-10-CM | POA: Diagnosis present

## 2020-12-03 DIAGNOSIS — Z794 Long term (current) use of insulin: Secondary | ICD-10-CM

## 2020-12-03 DIAGNOSIS — Z6835 Body mass index (BMI) 35.0-35.9, adult: Secondary | ICD-10-CM

## 2020-12-03 DIAGNOSIS — F32A Depression, unspecified: Secondary | ICD-10-CM | POA: Diagnosis present

## 2020-12-03 DIAGNOSIS — Z7984 Long term (current) use of oral hypoglycemic drugs: Secondary | ICD-10-CM

## 2020-12-03 DIAGNOSIS — Z9221 Personal history of antineoplastic chemotherapy: Secondary | ICD-10-CM | POA: Diagnosis not present

## 2020-12-03 DIAGNOSIS — F17210 Nicotine dependence, cigarettes, uncomplicated: Secondary | ICD-10-CM | POA: Diagnosis present

## 2020-12-03 DIAGNOSIS — Z853 Personal history of malignant neoplasm of breast: Secondary | ICD-10-CM | POA: Diagnosis not present

## 2020-12-03 DIAGNOSIS — Z9081 Acquired absence of spleen: Secondary | ICD-10-CM | POA: Diagnosis not present

## 2020-12-03 DIAGNOSIS — Z79899 Other long term (current) drug therapy: Secondary | ICD-10-CM

## 2020-12-03 DIAGNOSIS — M1990 Unspecified osteoarthritis, unspecified site: Secondary | ICD-10-CM | POA: Diagnosis present

## 2020-12-03 HISTORY — PX: ABDOMINAL EXPOSURE: SHX5708

## 2020-12-03 HISTORY — PX: ANTERIOR LUMBAR FUSION: SHX1170

## 2020-12-03 LAB — GLUCOSE, CAPILLARY
Glucose-Capillary: 162 mg/dL — ABNORMAL HIGH (ref 70–99)
Glucose-Capillary: 68 mg/dL — ABNORMAL LOW (ref 70–99)
Glucose-Capillary: 136 mg/dL — ABNORMAL HIGH (ref 70–99)
Glucose-Capillary: 165 mg/dL — ABNORMAL HIGH (ref 70–99)
Glucose-Capillary: 171 mg/dL — ABNORMAL HIGH (ref 70–99)
Glucose-Capillary: 159 mg/dL — ABNORMAL HIGH (ref 70–99)

## 2020-12-03 LAB — POCT I-STAT 7, (LYTES, BLD GAS, ICA,H+H)
Acid-base deficit: 2 mmol/L (ref 0.0–2.0)
Bicarbonate: 24.1 mmol/L (ref 20.0–28.0)
Calcium, Ion: 1.2 mmol/L (ref 1.15–1.40)
HCT: 37 % (ref 36.0–46.0)
Hemoglobin: 12.6 g/dL (ref 12.0–15.0)
O2 Saturation: 97 %
Patient temperature: 37.1
Potassium: 3.9 mmol/L (ref 3.5–5.1)
Sodium: 140 mmol/L (ref 135–145)
TCO2: 25 mmol/L (ref 22–32)
pCO2 arterial: 43.6 mmHg (ref 32.0–48.0)
pH, Arterial: 7.35 (ref 7.350–7.450)
pO2, Arterial: 98 mmHg (ref 83.0–108.0)

## 2020-12-03 LAB — POCT I-STAT, CHEM 8
BUN: 19 mg/dL (ref 8–23)
Calcium, Ion: 1.25 mmol/L (ref 1.15–1.40)
Chloride: 106 mmol/L (ref 98–111)
Creatinine, Ser: 0.8 mg/dL (ref 0.44–1.00)
Glucose, Bld: 154 mg/dL — ABNORMAL HIGH (ref 70–99)
HCT: 39 % (ref 36.0–46.0)
Hemoglobin: 13.3 g/dL (ref 12.0–15.0)
Potassium: 3.8 mmol/L (ref 3.5–5.1)
Sodium: 140 mmol/L (ref 135–145)
TCO2: 23 mmol/L (ref 22–32)

## 2020-12-03 LAB — CBC
HCT: 39.7 % (ref 36.0–46.0)
Hemoglobin: 13.1 g/dL (ref 12.0–15.0)
MCH: 32.8 pg (ref 26.0–34.0)
MCHC: 33 g/dL (ref 30.0–36.0)
MCV: 99.5 fL (ref 80.0–100.0)
Platelets: 270 10*3/uL (ref 150–400)
RBC: 3.99 MIL/uL (ref 3.87–5.11)
RDW: 13.4 % (ref 11.5–15.5)
WBC: 18.9 10*3/uL — ABNORMAL HIGH (ref 4.0–10.5)
nRBC: 0.1 % (ref 0.0–0.2)

## 2020-12-03 LAB — CREATININE, SERUM
Creatinine, Ser: 1.02 mg/dL — ABNORMAL HIGH (ref 0.44–1.00)
GFR, Estimated: >60 mL/min (ref >60)

## 2020-12-03 LAB — HEMOGLOBIN A1C
Hgb A1c MFr Bld: 6.4 % — ABNORMAL HIGH (ref 4.8–5.6)
Mean Plasma Glucose: 136.98 mg/dL

## 2020-12-03 LAB — ABO/RH: ABO/RH(D): A POS

## 2020-12-03 SURGERY — ANTERIOR LUMBAR FUSION 2 LEVELS
Anesthesia: General | Site: Spine Lumbar

## 2020-12-03 MED ORDER — 0.9 % SODIUM CHLORIDE (POUR BTL) OPTIME
TOPICAL | Status: DC | PRN
  Administered 2020-12-03: 1000 mL

## 2020-12-03 MED ORDER — POTASSIUM CHLORIDE IN NACL 20-0.9 MEQ/L-% IV SOLN: INTRAVENOUS | Status: DC

## 2020-12-03 MED ORDER — OXYCODONE HCL 5 MG PO TABS
10.0000 mg | ORAL_TABLET | ORAL | Status: DC | PRN
  Administered 2020-12-03 – 2020-12-05 (×9): 10 mg via ORAL
  Filled 2020-12-03 (×9): qty 2

## 2020-12-03 MED ORDER — ALBUMIN HUMAN 5 % IV SOLN: INTRAVENOUS | Status: DC | PRN

## 2020-12-03 MED ORDER — ACETAMINOPHEN 650 MG RE SUPP: 650.0000 mg | RECTAL | Status: DC | PRN

## 2020-12-03 MED ORDER — OXYCODONE HCL 5 MG/5ML PO SOLN: 5.0000 mg | Freq: Once | ORAL | Status: DC | PRN

## 2020-12-03 MED ORDER — PHENYLEPHRINE 40 MCG/ML (10ML) SYRINGE FOR IV PUSH (FOR BLOOD PRESSURE SUPPORT)
PREFILLED_SYRINGE | INTRAVENOUS | Status: AC
  Filled 2020-12-03: qty 10

## 2020-12-03 MED ORDER — CHLORHEXIDINE GLUCONATE 0.12 % MT SOLN
OROMUCOSAL | Status: AC
  Administered 2020-12-03: 15 mL
  Filled 2020-12-03: qty 15

## 2020-12-03 MED ORDER — SUGAMMADEX SODIUM 200 MG/2ML IV SOLN
INTRAVENOUS | Status: DC | PRN
  Administered 2020-12-03: 400 mg via INTRAVENOUS

## 2020-12-03 MED ORDER — ONDANSETRON HCL 4 MG/2ML IJ SOLN
INTRAMUSCULAR | Status: AC
  Filled 2020-12-03: qty 2

## 2020-12-03 MED ORDER — HYDROMORPHONE HCL 1 MG/ML IJ SOLN
0.2500 mg | INTRAMUSCULAR | Status: DC | PRN
  Administered 2020-12-03 (×4): 0.5 mg via INTRAVENOUS

## 2020-12-03 MED ORDER — MORPHINE SULFATE (PF) 2 MG/ML IV SOLN: 1.0000 mg | INTRAVENOUS | Status: DC | PRN

## 2020-12-03 MED ORDER — ACETAMINOPHEN 500 MG PO TABS
1000.0000 mg | ORAL_TABLET | Freq: Four times a day (QID) | ORAL | Status: AC
  Administered 2020-12-03 – 2020-12-04 (×3): 1000 mg via ORAL
  Filled 2020-12-03 (×4): qty 2

## 2020-12-03 MED ORDER — DEXAMETHASONE SODIUM PHOSPHATE 10 MG/ML IJ SOLN
INTRAMUSCULAR | Status: AC
  Filled 2020-12-03: qty 1

## 2020-12-03 MED ORDER — MAGNESIUM CITRATE PO SOLN
1.0000 | Freq: Once | ORAL | Status: AC | PRN
  Administered 2020-12-04: 1 via ORAL
  Filled 2020-12-03: qty 296

## 2020-12-03 MED ORDER — PHENYLEPHRINE HCL (PRESSORS) 10 MG/ML IV SOLN
INTRAVENOUS | Status: DC | PRN
  Administered 2020-12-03: 80 ug via INTRAVENOUS

## 2020-12-03 MED ORDER — VENLAFAXINE HCL ER 75 MG PO CP24
75.0000 mg | ORAL_CAPSULE | Freq: Every day | ORAL | Status: DC
  Administered 2020-12-04 – 2020-12-06 (×3): 75 mg via ORAL
  Filled 2020-12-03 (×3): qty 1

## 2020-12-03 MED ORDER — BISACODYL 5 MG PO TBEC
5.0000 mg | DELAYED_RELEASE_TABLET | Freq: Every day | ORAL | Status: DC | PRN
Start: 2020-12-03 — End: 2020-12-06

## 2020-12-03 MED ORDER — ROCURONIUM BROMIDE 10 MG/ML (PF) SYRINGE
PREFILLED_SYRINGE | INTRAVENOUS | Status: AC
  Filled 2020-12-03: qty 10

## 2020-12-03 MED ORDER — CHLORHEXIDINE GLUCONATE CLOTH 2 % EX PADS: 6.0000 | MEDICATED_PAD | Freq: Once | CUTANEOUS | Status: DC

## 2020-12-03 MED ORDER — MIDAZOLAM HCL 5 MG/5ML IJ SOLN
INTRAMUSCULAR | Status: DC | PRN
  Administered 2020-12-03: 2 mg via INTRAVENOUS

## 2020-12-03 MED ORDER — CHLORHEXIDINE GLUCONATE 4 % EX LIQD: 60.0000 mL | Freq: Once | CUTANEOUS | Status: DC

## 2020-12-03 MED ORDER — ACETAMINOPHEN 500 MG PO TABS
ORAL_TABLET | ORAL | Status: AC
  Administered 2020-12-03: 1000 mg via ORAL
  Filled 2020-12-03: qty 2

## 2020-12-03 MED ORDER — SODIUM CHLORIDE 0.9% FLUSH: 3.0000 mL | INTRAVENOUS | Status: DC | PRN

## 2020-12-03 MED ORDER — VANCOMYCIN HCL IN DEXTROSE 1-5 GM/200ML-% IV SOLN: 1000.0000 mg | INTRAVENOUS | Status: AC

## 2020-12-03 MED ORDER — ONDANSETRON HCL 4 MG PO TABS: 4.0000 mg | ORAL_TABLET | Freq: Four times a day (QID) | ORAL | Status: DC | PRN

## 2020-12-03 MED ORDER — ASPIRIN EC 81 MG PO TBEC
81.0000 mg | DELAYED_RELEASE_TABLET | Freq: Every day | ORAL | Status: DC
  Administered 2020-12-04 – 2020-12-06 (×3): 81 mg via ORAL
  Filled 2020-12-03 (×3): qty 1

## 2020-12-03 MED ORDER — INSULIN GLARGINE 100 UNIT/ML ~~LOC~~ SOLN
50.0000 [IU] | Freq: Every day | SUBCUTANEOUS | Status: DC
  Administered 2020-12-03 – 2020-12-05 (×3): 50 [IU] via SUBCUTANEOUS
  Filled 2020-12-03 (×5): qty 0.5

## 2020-12-03 MED ORDER — PHENYLEPHRINE HCL-NACL 10-0.9 MG/250ML-% IV SOLN
INTRAVENOUS | Status: DC | PRN
  Administered 2020-12-03: 30 ug/min via INTRAVENOUS

## 2020-12-03 MED ORDER — CLOTRIMAZOLE 1 % EX CREA
1.0000 "application " | TOPICAL_CREAM | Freq: Every day | CUTANEOUS | Status: DC | PRN
  Filled 2020-12-03: qty 15

## 2020-12-03 MED ORDER — HYDROMORPHONE HCL 1 MG/ML IJ SOLN
INTRAMUSCULAR | Status: AC
  Filled 2020-12-03: qty 1

## 2020-12-03 MED ORDER — PROPOFOL 10 MG/ML IV BOLUS
INTRAVENOUS | Status: DC | PRN
  Administered 2020-12-03: 150 mg via INTRAVENOUS

## 2020-12-03 MED ORDER — ACETAMINOPHEN 325 MG PO TABS
650.0000 mg | ORAL_TABLET | ORAL | Status: DC | PRN
  Administered 2020-12-04 – 2020-12-05 (×4): 650 mg via ORAL
  Filled 2020-12-03 (×4): qty 2

## 2020-12-03 MED ORDER — DEXAMETHASONE SODIUM PHOSPHATE 10 MG/ML IJ SOLN
INTRAMUSCULAR | Status: DC | PRN
  Administered 2020-12-03: 4 mg via INTRAVENOUS

## 2020-12-03 MED ORDER — CYCLOBENZAPRINE HCL 10 MG PO TABS
10.0000 mg | ORAL_TABLET | Freq: Three times a day (TID) | ORAL | Status: DC | PRN
  Administered 2020-12-03 – 2020-12-05 (×4): 10 mg via ORAL
  Filled 2020-12-03 (×4): qty 1

## 2020-12-03 MED ORDER — SENNOSIDES-DOCUSATE SODIUM 8.6-50 MG PO TABS: 1.0000 | ORAL_TABLET | Freq: Every evening | ORAL | Status: DC | PRN

## 2020-12-03 MED ORDER — LACTATED RINGERS IV SOLN: INTRAVENOUS | Status: DC

## 2020-12-03 MED ORDER — LACTATED RINGERS IV SOLN: INTRAVENOUS | Status: DC | PRN

## 2020-12-03 MED ORDER — LIDOCAINE-EPINEPHRINE 0.5 %-1:200000 IJ SOLN
INTRAMUSCULAR | Status: AC
  Filled 2020-12-03: qty 1

## 2020-12-03 MED ORDER — SODIUM CHLORIDE 0.9 % IV SOLN: 250.0000 mL | INTRAVENOUS | Status: DC

## 2020-12-03 MED ORDER — ONDANSETRON HCL 4 MG/2ML IJ SOLN
INTRAMUSCULAR | Status: DC | PRN
  Administered 2020-12-03: 4 mg via INTRAVENOUS

## 2020-12-03 MED ORDER — THROMBIN 20000 UNITS EX SOLR
CUTANEOUS | Status: AC
  Filled 2020-12-03: qty 20000

## 2020-12-03 MED ORDER — AMISULPRIDE (ANTIEMETIC) 5 MG/2ML IV SOLN: 10.0000 mg | Freq: Once | INTRAVENOUS | Status: DC | PRN

## 2020-12-03 MED ORDER — INSULIN ASPART 100 UNIT/ML ~~LOC~~ SOLN
0.0000 [IU] | Freq: Three times a day (TID) | SUBCUTANEOUS | Status: DC
  Administered 2020-12-03 – 2020-12-05 (×2): 4 [IU] via SUBCUTANEOUS
  Administered 2020-12-05: 7 [IU] via SUBCUTANEOUS
  Administered 2020-12-06: 4 [IU] via SUBCUTANEOUS

## 2020-12-03 MED ORDER — ROSUVASTATIN CALCIUM 20 MG PO TABS
20.0000 mg | ORAL_TABLET | Freq: Every day | ORAL | Status: DC
  Administered 2020-12-04 – 2020-12-06 (×3): 20 mg via ORAL
  Filled 2020-12-03 (×3): qty 1

## 2020-12-03 MED ORDER — ONDANSETRON HCL 4 MG/2ML IJ SOLN: 4.0000 mg | Freq: Four times a day (QID) | INTRAMUSCULAR | Status: DC | PRN

## 2020-12-03 MED ORDER — TRAZODONE HCL 100 MG PO TABS
100.0000 mg | ORAL_TABLET | Freq: Every day | ORAL | Status: DC
  Administered 2020-12-03 – 2020-12-05 (×3): 100 mg via ORAL
  Filled 2020-12-03 (×3): qty 1

## 2020-12-03 MED ORDER — ROCURONIUM BROMIDE 10 MG/ML (PF) SYRINGE
PREFILLED_SYRINGE | INTRAVENOUS | Status: DC | PRN
  Administered 2020-12-03 (×3): 20 mg via INTRAVENOUS
  Administered 2020-12-03: 60 mg via INTRAVENOUS
  Administered 2020-12-03: 20 mg via INTRAVENOUS

## 2020-12-03 MED ORDER — LIDOCAINE 2% (20 MG/ML) 5 ML SYRINGE
INTRAMUSCULAR | Status: DC | PRN
  Administered 2020-12-03: 60 mg via INTRAVENOUS

## 2020-12-03 MED ORDER — METOPROLOL TARTRATE 25 MG PO TABS
37.5000 mg | ORAL_TABLET | Freq: Two times a day (BID) | ORAL | Status: DC
  Administered 2020-12-03 – 2020-12-06 (×5): 37.5 mg via ORAL
  Filled 2020-12-03 (×5): qty 2

## 2020-12-03 MED ORDER — ARTIFICIAL TEARS OPHTHALMIC OINT
TOPICAL_OINTMENT | OPHTHALMIC | Status: AC
  Filled 2020-12-03: qty 3.5

## 2020-12-03 MED ORDER — FENTANYL CITRATE (PF) 100 MCG/2ML IJ SOLN
INTRAMUSCULAR | Status: DC | PRN
  Administered 2020-12-03: 100 ug via INTRAVENOUS
  Administered 2020-12-03 (×4): 50 ug via INTRAVENOUS

## 2020-12-03 MED ORDER — OXYCODONE HCL ER 10 MG PO T12A
10.0000 mg | EXTENDED_RELEASE_TABLET | Freq: Two times a day (BID) | ORAL | Status: DC
  Administered 2020-12-03 – 2020-12-06 (×6): 10 mg via ORAL
  Filled 2020-12-03 (×6): qty 1

## 2020-12-03 MED ORDER — FENTANYL CITRATE (PF) 250 MCG/5ML IJ SOLN
INTRAMUSCULAR | Status: AC
  Filled 2020-12-03: qty 5

## 2020-12-03 MED ORDER — OXYCODONE HCL 5 MG PO TABS: 5.0000 mg | ORAL_TABLET | ORAL | Status: DC | PRN

## 2020-12-03 MED ORDER — HEPARIN SODIUM (PORCINE) 5000 UNIT/ML IJ SOLN
5000.0000 [IU] | Freq: Three times a day (TID) | INTRAMUSCULAR | Status: DC
  Administered 2020-12-04 – 2020-12-06 (×7): 5000 [IU] via SUBCUTANEOUS
  Filled 2020-12-03 (×7): qty 1

## 2020-12-03 MED ORDER — PHENOL 1.4 % MT LIQD: 1.0000 | OROMUCOSAL | Status: DC | PRN

## 2020-12-03 MED ORDER — OXYCODONE HCL 5 MG PO TABS: 5.0000 mg | ORAL_TABLET | Freq: Once | ORAL | Status: DC | PRN

## 2020-12-03 MED ORDER — TEMAZEPAM 15 MG PO CAPS
15.0000 mg | ORAL_CAPSULE | Freq: Every day | ORAL | Status: DC
  Administered 2020-12-03 – 2020-12-05 (×3): 15 mg via ORAL
  Filled 2020-12-03 (×3): qty 1

## 2020-12-03 MED ORDER — FLUCONAZOLE 150 MG PO TABS
150.0000 mg | ORAL_TABLET | Freq: Every day | ORAL | Status: DC | PRN
  Filled 2020-12-03: qty 1

## 2020-12-03 MED ORDER — GLIPIZIDE ER 5 MG PO TB24
5.0000 mg | ORAL_TABLET | Freq: Every day | ORAL | Status: DC
  Administered 2020-12-04 – 2020-12-06 (×3): 5 mg via ORAL
  Filled 2020-12-03 (×3): qty 1

## 2020-12-03 MED ORDER — DEXTROSE 50 % IV SOLN
12.5000 g | INTRAVENOUS | Status: AC
  Administered 2020-12-03: 12.5 g via INTRAVENOUS
  Filled 2020-12-03: qty 50

## 2020-12-03 MED ORDER — VANCOMYCIN HCL IN DEXTROSE 1-5 GM/200ML-% IV SOLN
INTRAVENOUS | Status: AC
  Administered 2020-12-03: 1000 mg via INTRAVENOUS
  Filled 2020-12-03: qty 200

## 2020-12-03 MED ORDER — ORAL CARE MOUTH RINSE: 15.0000 mL | Freq: Once | OROMUCOSAL | Status: DC

## 2020-12-03 MED ORDER — SENNA 8.6 MG PO TABS
1.0000 | ORAL_TABLET | Freq: Two times a day (BID) | ORAL | Status: DC
  Administered 2020-12-03 – 2020-12-05 (×3): 8.6 mg via ORAL
  Filled 2020-12-03 (×5): qty 1

## 2020-12-03 MED ORDER — CHLORHEXIDINE GLUCONATE 0.12 % MT SOLN: 15.0000 mL | Freq: Once | OROMUCOSAL | Status: DC

## 2020-12-03 MED ORDER — DULAGLUTIDE 1.5 MG/0.5ML ~~LOC~~ SOAJ: 1.5000 mg | SUBCUTANEOUS | Status: DC

## 2020-12-03 MED ORDER — LORAZEPAM 1 MG PO TABS: 1.0000 mg | ORAL_TABLET | Freq: Every day | ORAL | Status: DC | PRN

## 2020-12-03 MED ORDER — SODIUM CHLORIDE 0.9% FLUSH: 3.0000 mL | Freq: Two times a day (BID) | INTRAVENOUS | Status: DC

## 2020-12-03 MED ORDER — PROPOFOL 10 MG/ML IV BOLUS
INTRAVENOUS | Status: AC
  Filled 2020-12-03: qty 20

## 2020-12-03 MED ORDER — LIDOCAINE 2% (20 MG/ML) 5 ML SYRINGE
INTRAMUSCULAR | Status: AC
  Filled 2020-12-03: qty 5

## 2020-12-03 MED ORDER — INSULIN DEGLUDEC 200 UNIT/ML ~~LOC~~ SOPN: 50.0000 [IU] | PEN_INJECTOR | Freq: Every day | SUBCUTANEOUS | Status: DC

## 2020-12-03 MED ORDER — MENTHOL 3 MG MT LOZG: 1.0000 | LOZENGE | OROMUCOSAL | Status: DC | PRN

## 2020-12-03 MED ORDER — ACETAMINOPHEN 325 MG PO TABS: 650.0000 mg | ORAL_TABLET | ORAL | Status: DC | PRN

## 2020-12-03 MED ORDER — PROMETHAZINE HCL 25 MG/ML IJ SOLN: 6.2500 mg | INTRAMUSCULAR | Status: DC | PRN

## 2020-12-03 MED ORDER — THROMBIN 20000 UNITS EX SOLR: CUTANEOUS | Status: DC | PRN

## 2020-12-03 MED ORDER — PANTOPRAZOLE SODIUM 40 MG PO TBEC
80.0000 mg | DELAYED_RELEASE_TABLET | Freq: Every day | ORAL | Status: DC
  Administered 2020-12-04 – 2020-12-06 (×3): 80 mg via ORAL
  Filled 2020-12-03 (×3): qty 2

## 2020-12-03 MED ORDER — MIDAZOLAM HCL 2 MG/2ML IJ SOLN
INTRAMUSCULAR | Status: AC
  Filled 2020-12-03: qty 2

## 2020-12-03 MED ORDER — TRIAMTERENE-HCTZ 37.5-25 MG PO TABS
1.0000 | ORAL_TABLET | Freq: Every day | ORAL | Status: DC
  Administered 2020-12-05 – 2020-12-06 (×2): 1 via ORAL
  Filled 2020-12-03 (×4): qty 1

## 2020-12-03 MED ORDER — ACETAMINOPHEN 500 MG PO TABS: 1000.0000 mg | ORAL_TABLET | Freq: Once | ORAL | Status: AC

## 2020-12-03 SURGICAL SUPPLY — 72 items
APPLIER CLIP 11 MED OPEN (CLIP) ×3
BLADE CLIPPER SURG (BLADE) IMPLANT
CARTRIDGE OIL MAESTRO DRILL (MISCELLANEOUS) IMPLANT
CLIP APPLIE 11 MED OPEN (CLIP) ×2 IMPLANT
CLIP LIGATING EXTRA MED SLVR (CLIP) ×3 IMPLANT
CLIP LIGATING EXTRA SM BLUE (MISCELLANEOUS) ×3 IMPLANT
COVER WAND RF STERILE (DRAPES) ×6 IMPLANT
DERMABOND ADVANCED (GAUZE/BANDAGES/DRESSINGS) ×2
DERMABOND ADVANCED .7 DNX12 (GAUZE/BANDAGES/DRESSINGS) ×4 IMPLANT
DIFFUSER DRILL AIR PNEUMATIC (MISCELLANEOUS) IMPLANT
DRAPE C-ARM 42X72 X-RAY (DRAPES) ×3 IMPLANT
DRAPE C-ARMOR (DRAPES) ×3 IMPLANT
DRAPE LAPAROTOMY 100X72X124 (DRAPES) ×3 IMPLANT
DRSG OPSITE POSTOP 4X8 (GAUZE/BANDAGES/DRESSINGS) ×3 IMPLANT
DURAPREP 26ML APPLICATOR (WOUND CARE) ×3 IMPLANT
ELECT BLADE 4.0 EZ CLEAN MEGAD (MISCELLANEOUS) ×3
ELECT REM PT RETURN 9FT ADLT (ELECTROSURGICAL) ×3
ELECTRODE BLDE 4.0 EZ CLN MEGD (MISCELLANEOUS) ×2 IMPLANT
ELECTRODE REM PT RTRN 9FT ADLT (ELECTROSURGICAL) ×2 IMPLANT
GAUZE 4X4 16PLY RFD (DISPOSABLE) IMPLANT
GLOVE ECLIPSE 6.5 STRL STRAW (GLOVE) ×3 IMPLANT
GLOVE EXAM NITRILE XL STR (GLOVE) IMPLANT
GLOVE SS BIOGEL STRL SZ 7.5 (GLOVE) ×2 IMPLANT
GLOVE SUPERSENSE BIOGEL SZ 7.5 (GLOVE) ×1
GOWN STRL REUS W/ TWL LRG LVL3 (GOWN DISPOSABLE) ×2 IMPLANT
GOWN STRL REUS W/ TWL XL LVL3 (GOWN DISPOSABLE) IMPLANT
GOWN STRL REUS W/TWL 2XL LVL3 (GOWN DISPOSABLE) ×3 IMPLANT
GOWN STRL REUS W/TWL LRG LVL3 (GOWN DISPOSABLE) ×3
GOWN STRL REUS W/TWL XL LVL3 (GOWN DISPOSABLE)
GRAFT BONE PROTEIOS LRG 5CC (Orthopedic Implant) ×6 IMPLANT
INSERT FOGARTY 61MM (MISCELLANEOUS) IMPLANT
INSERT FOGARTY SM (MISCELLANEOUS) IMPLANT
KIT BASIN OR (CUSTOM PROCEDURE TRAY) ×3 IMPLANT
KIT TURNOVER KIT B (KITS) ×3 IMPLANT
LOOP VESSEL MAXI BLUE (MISCELLANEOUS) IMPLANT
LOOP VESSEL MINI RED (MISCELLANEOUS) IMPLANT
NEEDLE BLUNT 18X1 FOR OR ONLY (NEEDLE) ×3 IMPLANT
NEEDLE HYPO 25X1 1.5 SAFETY (NEEDLE) ×3 IMPLANT
NS IRRIG 1000ML POUR BTL (IV SOLUTION) ×3 IMPLANT
OIL CARTRIDGE MAESTRO DRILL (MISCELLANEOUS)
PACK LAMINECTOMY NEURO (CUSTOM PROCEDURE TRAY) ×3 IMPLANT
PAD ARMBOARD 7.5X6 YLW CONV (MISCELLANEOUS) ×6 IMPLANT
SCREW SYNFIX EVOL FINTIP 25 ST (Screw) ×9 IMPLANT
SCREW SYNFIX EVOL FINTIP 30 ST (Screw) ×3 IMPLANT
SLEEVE SYNFIX EVOL PROTECT ST (SLEEVE) ×3 IMPLANT
SLEEVE SYNFIX EVOL STL STER (SLEEVE) ×6 IMPLANT
SPACER SYNFIX EVOL 12X6 (Spacer) ×6 IMPLANT
SPONGE INTESTINAL PEANUT (DISPOSABLE) ×3 IMPLANT
SPONGE LAP 18X18 RF (DISPOSABLE) IMPLANT
SPONGE LAP 4X18 RFD (DISPOSABLE) IMPLANT
SPONGE SURGIFOAM ABS GEL SZ50 (HEMOSTASIS) ×3 IMPLANT
STAPLER VISISTAT 35W (STAPLE) IMPLANT
SUT PROLENE 4 0 RB 1 (SUTURE)
SUT PROLENE 4-0 RB1 .5 CRCL 36 (SUTURE) IMPLANT
SUT PROLENE 5 0 CC1 (SUTURE) IMPLANT
SUT PROLENE 6 0 C 1 30 (SUTURE) ×3 IMPLANT
SUT PROLENE 6 0 CC (SUTURE) IMPLANT
SUT SILK 0 TIES 10X30 (SUTURE) ×3 IMPLANT
SUT SILK 2 0 TIES 10X30 (SUTURE) ×3 IMPLANT
SUT SILK 2 0SH CR/8 30 (SUTURE) IMPLANT
SUT SILK 3 0 TIES 10X30 (SUTURE) ×3 IMPLANT
SUT SILK 3 0SH CR/8 30 (SUTURE) IMPLANT
SUT VIC AB 0 CT1 27 (SUTURE) ×3
SUT VIC AB 0 CT1 27XBRD ANBCTR (SUTURE) ×2 IMPLANT
SUT VIC AB 2-0 CP2 18 (SUTURE) ×3 IMPLANT
SUT VIC AB 3-0 SH 8-18 (SUTURE) ×3 IMPLANT
SYR 5ML LL (SYRINGE) ×3 IMPLANT
SYR 5ML LUER SLIP (SYRINGE) ×3 IMPLANT
TOWEL GREEN STERILE (TOWEL DISPOSABLE) ×6 IMPLANT
TOWEL GREEN STERILE FF (TOWEL DISPOSABLE) ×3 IMPLANT
TRAY FOLEY MTR SLVR 16FR STAT (SET/KITS/TRAYS/PACK) ×3 IMPLANT
WATER STERILE IRR 1000ML POUR (IV SOLUTION) ×3 IMPLANT

## 2020-12-03 NOTE — Progress Notes (Signed)
Inpatient Diabetes Program Recommendations  AACE/ADA: New Consensus Statement on Inpatient Glycemic Control (2015)  Target Ranges:  Prepandial:   less than 140 mg/dL      Peak postprandial:   less than 180 mg/dL (1-2 hours)      Critically ill patients:  140 - 180 mg/dL   Lab Results  Component Value Date   GLUCAP 136 (H) 12/03/2020   HGBA1C 6.6 (A) 09/29/2020    Review of Glycemic Control  Diabetes history: DM2 Outpatient Diabetes medications: Tresiba 50 units QD, Trulicity 1.5 mg weekly, glipizide 5 mg QD Current orders for Inpatient glycemic control: None  HgbA1C - 6.6%  Inpatient Diabetes Program Recommendations:     After surgery:  Lantus 25 units QD Novolog 0-15 units TID with meals and 0-5 HS Novolog 4 units TID with meals if eating > 50% meal  Watch glucose trends.  Thank you. Lorenda Peck, RD, LDN, CDE Inpatient Diabetes Coordinator [phone]

## 2020-12-03 NOTE — H&P (Signed)
BP (!) 106/40   Pulse 69   Temp (!) 97.2 F (36.2 C) (Oral)   Resp 18   Ht 5\' 5"  (1.651 m)   Wt 97.1 kg   SpO2 98%   BMI 35.61 kg/m      Annette [name] returns today after undergoing a discogram.  She had concordant pain at both L4-5 and L5-S1.  She had discordant pain at L3-4.  The L5-1 and the L4-5 disc spaces are markedly degenerated.  At this point, given the positive result in the discogram, I believe it is fair to offer her a lumbar arthrodesis.  At 5-1, I would much prefer, if it is at all possible, to do an anterior lumbar interbody arthrodesis.  If we are able to get the L4-5 with that approach, I would also prefer to do an anterior L4-5 procedure.  If not, I would proceed to turn her over and decompress her posteriorly at L4-5 in a conventional fashion using an interbody cage and pedicle screws.  She is alert, she is oriented by 4.  She answers all questions appropriately.  Memory, language, attention span, and fund of knowledge are normal. Speech is clear, it is also fluent.  She has full strength in the lower extremities.  Normal muscle tone and bulk.  Weighs 216 pounds, temperature is 97.5, blood pressure 106/68, pulse  78 pain 8/10.  Risks and benefits, bleeding, infection, no pain relief, need for further surgery, damage to the bowels and/or bladder, damage to bowel and/or bladder function, damage to the nerve roots, fusion failure, hardware failure, need for further operations were discussed.  She understands and wishes to proceed. Allergies  Allergen Reactions  . Penicillins Swelling    Has patient had a PCN reaction causing immediate rash, facial/tongue/throat swelling, SOB or lightheadedness with hypotension: Yes Has patient had a PCN reaction causing severe rash involving mucus membranes or skin necrosis: No Has patient had a PCN reaction that required hospitalization: No  Has patient had a PCN reaction occurring within the last 10 years: No If all of the above answers are  "NO", then may proceed with Cephalosporin use.   Of face and tongue.  . Metformin And Related Diarrhea and Nausea Only    Pt to discontinue med due to intolerance, cramps and diarrhea  . Wound Dressing Adhesive Itching and Rash    Dermabond and other surgical glues for incisions  . Empagliflozin Other (See Comments)    Yeast infection  Jardiance  . Penicillin G Other (See Comments)  . Statins Other (See Comments)    Fell bad but on Crestor  . Zetia [Ezetimibe] Other (See Comments)    Feels funny   Past Medical History:  Diagnosis Date  . Arthritis    Phreesia 10/14/2020  . Blood transfusion without reported diagnosis    Phreesia 10/14/2020  . Breast cancer ([place]) 2007   History of  XRT, onTamoxifen  . Bruises easily   . Cancer ([place])    Phreesia 10/14/2020  . Depression   . Depression    Phreesia 10/14/2020  . Diabetes mellitus    prediabetic  . Diabetes mellitus without complication ([place])    Phreesia 10/14/2020  . GERD (gastroesophageal reflux disease)   . Hypertriglyceridemia   . Hypothyroidism    following chemo amnd radiaition for breast cancer, needed replacement short term  . Hypothyroidism (acquired)    replaced x 1 year  . IBS (irritable bowel syndrome)   . Menieres disease    Controlled with  triamterene   . Migraines   . Obesity   . Palpitations   . 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[place])   . Sleep apnea 2010   Problems with CPAP   Past Surgical History:  Procedure Laterality Date  . ABDOMINAL HYSTERECTOMY  1998   Benign, fibroids  . 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 APPENDECTOMY  06/12/08  . BIOPSY  07/02/2020   Procedure: BIOPSY;  Surgeon: Harvel Quale, MD;  [facility];  Service: Gastroenterology;;  . BREAST LUMPECTOMY Left 2008  . BREAST RECONSTRUCTION     Left reconstructive  . BREAST SURGERY  2007   Left lumpectomy  . BREAST SURGERY     Mammosite - right side  . CHOLECYSTECTOMY   1985  . COLONOSCOPY WITH PROPOFOL N/A 07/02/2020   Procedure: COLONOSCOPY WITH PROPOFOL;  Surgeon: Harvel Quale, MD;  [facility];  Service: Gastroenterology;  Laterality: N/A;  1045  . ESOPHAGEAL DILATION  12/19/2015   Procedure: ESOPHAGEAL DILATION;  Surgeon: Rogene Houston, MD;  [facility];  Service: Endoscopy;;  . ESOPHAGOGASTRODUODENOSCOPY N/A 12/19/2015   Procedure: ESOPHAGOGASTRODUODENOSCOPY (EGD);  Surgeon: Rogene Houston, MD;  [facility];  Service: Endoscopy;  Laterality: N/A;  11:40  . POLYPECTOMY  07/02/2020   Procedure: POLYPECTOMY;  Surgeon: Montez Morita, Quillian Quince, MD;  [facility];  Service: Gastroenterology;;  . 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 THYROIDECTOMY, PARTIAL  11/05/2010   Benign disease   Family History  Problem Relation Age of Onset  . Diabetes Mother   . Arrhythmia Mother   . Heart disease Father   . Hyperlipidemia Father   . Arrhythmia Father   . Cancer Paternal Grandmother        Breast  . Diabetes Sister   . Diabetes Brother   . Heart disease Brother    Social History   Socioeconomic History  . Marital status: Married    Spouse name: Alroy Dust  . Number of children: 2  . Years of education: Not on file  . Highest education level: Not on file  Occupational History  . Not on file  Tobacco Use  . Smoking status: Current Every Day Smoker    Packs/day: 0.50    Years: 40.00    Pack years: 20.00    Types: Cigarettes  . Smokeless tobacco: Never Used  . Tobacco comment: 1/2 pack a day  Vaping Use  . Vaping Use: Never used  Substance and Sexual Activity  . Alcohol use: No    Alcohol/week: 0.0 standard drinks    Comment: Encouraged to quit smoking. She has tried the nicotone gum and patches but did not help  . Drug use: No  . Sexual activity: Yes    Birth control/protection: Surgical  Other Topics Concern  . Not on file  Social  History Narrative  . Not on file   Social Determinants of Health   Financial Resource Strain: Medium Risk  . Difficulty of Paying Living Expenses: Somewhat hard  Food Insecurity: No Food Insecurity  . Worried About Charity fundraiser in the Last Year: Never true  . Ran Out of Food in the Last Year: Never true  Transportation Needs: No Transportation Needs  . Lack of Transportation (Medical): No  . Lack of Transportation (Non-Medical): No  Physical Activity: Inactive  . Days of Exercise per Week: 0 days  . Minutes of  Exercise per Session: 0 min  Stress: Stress Concern Present  . Feeling of Stress : To some extent  Social Connections: Moderately Integrated  . Frequency of Communication with Friends and Family: Three times a week  . Frequency of Social Gatherings with Friends and Family: Three times a week  . Attends Religious Services: More than 4 times per year  . Active Member of Clubs or Organizations: No  . Attends Archivist Meetings: Never  . Marital Status: Married  Human resources officer Violence: Not At Risk  . Fear of Current or Ex-Partner: No  . Emotionally Abused: No  . Physically Abused: No  . Sexually Abused: No   Prior to Admission medications   Medication Sig Start Date End Date Taking? Authorizing Provider  aspirin 81 MG EC tablet Take 81 mg by mouth daily.   Yes [provider]  CLOTRIMAZOLE EX Apply 1 application topically daily as needed (itching an breast). 15 %   Yes [provider]  cyclobenzaprine (FLEXERIL) 10 MG tablet Take 10 mg by mouth daily as needed for muscle spasms.   Yes [provider]  Dulaglutide (TRULICITY) 1.5 XB/1.4NW SOPN Inject 0.5 mLs (1.5 mg total) into the skin once a week. 04/17/20  Yes Philemon Kingdom, MD  ergocalciferol (VITAMIN D2) 1.25 MG (50000 UT) capsule Take 50,000 Units by mouth once a week.   Yes [provider]  glipiZIDE (GLUCOTROL XL) 5 MG 24 hr tablet Take 1 tablet (5 mg total) by  mouth daily with breakfast. 06/24/20  Yes Philemon Kingdom, MD  insulin degludec (TRESIBA FLEXTOUCH) 200 UNIT/ML FlexTouch Pen Inject 50 Units into the skin daily. Patient taking differently: Inject 50 Units into the skin at bedtime. 09/29/20  Yes Philemon Kingdom, MD  LORazepam (ATIVAN) 1 MG tablet Take 1 mg by mouth daily as needed (Nausea).   Yes [provider]  Metoprolol Tartrate 37.5 MG TABS TAKE 1 TABLET BY MOUTH TWICE DAILY Patient taking differently: Take 37.5 mg by mouth 2 (two) times daily. 07/25/20  Yes Fayrene Helper, MD  naproxen (NAPROSYN) 500 MG tablet Take 500 mg by mouth daily as needed for headache.   Yes [provider]  omeprazole (PRILOSEC) 40 MG capsule Take 1 capsule (40 mg total) by mouth daily. 10/11/20  Yes Fayrene Helper, MD  rosuvastatin (CRESTOR) 20 MG tablet Take 20 mg by mouth daily.   Yes [provider]  temazepam (RESTORIL) 15 MG capsule Take 1 capsule (15 mg total) by mouth at bedtime. 11/03/20  Yes Fayrene Helper, MD  traZODone (DESYREL) 100 MG tablet Take 100 mg by mouth at bedtime.   Yes [provider]  triamterene-hydrochlorothiazide (MAXZIDE-25) 37.5-25 MG tablet Take 1 tablet by mouth daily.   Yes [provider]  venlafaxine XR (EFFEXOR-XR) 75 MG 24 hr capsule Take 75 mg by mouth daily with breakfast.   Yes [provider]  fluconazole (DIFLUCAN) 150 MG tablet TAKE ONE TABLET ONCE DAILY , AS NEEDED, FOR VAGINAL ITCH ASSOCIATED WITH ANTIBIOTIC USE Patient taking differently: Take 150 mg by mouth See admin instructions. TAKE ONE TABLET ONCE DAILY , AS NEEDED, FOR VAGINAL ITCH ASSOCIATED WITH ANTIBIOTIC USE 10/07/20   Fayrene Helper, MD  glucose blood test strip See admin instructions. 01/31/20   [provider]  Insulin Pen Needle (PEN NEEDLES) 31G X 6 MM MISC See admin instructions. 01/31/20   [provider]  ONETOUCH VERIO test strip USE AS DIRECTED FOUR TIMES DAILY  08/22/20  Fayrene Helper, MD   Physical Exam Constitutional:      Appearance: Normal appearance.  HENT:     Head: Normocephalic and atraumatic.     Nose: Nose normal.     Mouth/Throat:     Mouth: Mucous membranes are moist.     Pharynx: Oropharynx is clear.  Eyes:     Extraocular Movements: Extraocular movements intact.     Conjunctiva/sclera: Conjunctivae normal.     Pupils: Pupils are equal, round, and reactive to light.  Cardiovascular:     Rate and Rhythm: Normal rate and regular rhythm.     Pulses: Normal pulses.     Heart sounds: Normal heart sounds.  Pulmonary:     Effort: Pulmonary effort is normal.     Breath sounds: Normal breath sounds.  Abdominal:     General: Abdomen is flat.  Musculoskeletal:        General: Normal range of motion.     Cervical back: Normal range of motion and neck supple.  Skin:    General: Skin is warm and dry.  Neurological:     General: No focal deficit present.     Mental Status: She is alert and oriented to person, place, and time.     Cranial Nerves: Cranial nerves are intact.     Sensory: Sensation is intact.     Motor: Motor function is intact. No weakness.     Coordination: Coordination is intact.     Gait: Gait is intact.     Deep Tendon Reflexes: Reflexes are normal and symmetric.   OR for alif at L4/5,5/1. Risks and benefits were explained she understands and wishes to proceed.

## 2020-12-03 NOTE — Progress Notes (Signed)
Spoke with Bonnita Nasuti MA at Dr. Christella Noa regarding dressing being saturated with blood when patient stood prior to that dressing has been dry and intact Made Bonnita Nasuti aware pressure dressing applied and bleeding had stopped requested new dressing be in place and call back with any further problems

## 2020-12-03 NOTE — Op Note (Signed)
    OPERATIVE REPORT  DATE OF SURGERY: 12/03/2020  PATIENT: LENNY BOUCHILLON, 60 y.o. female MRN: [id number]  DOB: Jun 08, 1961  PRE-OPERATIVE DIAGNOSIS: Degenerative disc disease  POST-OPERATIVE DIAGNOSIS:  Same  PROCEDURE: Anterior exposure for L4-5 and L5-S1 disc fusion  SURGEON:  Curt Jews, M.D.  Co-surgeon for the exposure Dr. Ashok Pall  PHYSICIAN ASSISTANT: Dr. Servando Snare  The assistant was needed for exposure and to expedite the case  ANESTHESIA: General  EBL: per anesthesia record  Total I/O In: 2350 [I.V.:2100; IV Piggyback:250] Out: 32 [Urine:110; Blood:175]  BLOOD ADMINISTERED: none  DRAINS: none  SPECIMEN: none  COUNTS CORRECT:  YES  PATIENT DISPOSITION:  PACU - hemodynamically stable  PROCEDURE DETAILS: The patient was taken up and placed supine xiphoid area of the abdomen was prepped draped in sterile fashion.  Crosstable lateral images were reviewed at the 4 5 and 5 1 level.  This was marked on the anterior abdominal wall.  A left paramedian incision was made over this level and carried down through the subcutaneous fat with electrocautery.  Anterior rectus sheath was opened in line with the skin incision.  The rectus muscle was mobilized medially and the retroperitoneal space was entered bluntly.  The intraperitoneal contents were mobilized to the right.  Dissection was continued above the level of the iliac vessels.  The L5-S1 disc is identified.  Middle sacral vessels were clipped and divided.  After adequate exposure was obtained at the L5-S1 disc, attention was turned to the L4-5 disc.  The iliac vessels were mobilized to the right.  The patient had one large single iliolumbar vein and this was ligated and divided.  Blunt dissection was used to give exposure of the L4-5 disc.  The Thompson retractor was brought onto the field and the 4 5 disc was exposed with a reverse lip 150 blade to the right and left of the L4-5 disc.  Malleable retractors were  used for superior and inferior exposure.  The discectomy and fusion will be dictated as a separate note by Dr. Christella Noa.  Following this I rescrubbed into the procedure and repositioned the retractors to the L5-S1 level.  The remainder the procedure will be dictated as a separate note by Dr. Festus Barren, M.D., [facility] 12/03/2020 2:13 PM  Note: Portions of this report may have been transcribed using voice recognition software.  Every effort has been made to ensure accuracy; however, inadvertent computerized transcription errors may still be present.

## 2020-12-03 NOTE — Transfer of Care (Signed)
Immediate Anesthesia Transfer of Care Note  Patient: RAFEEF [name]  Procedure(s) Performed: Lumbar Four-Five Lumbar Five Sacral One Anterior lumbar interbody fusion (N/A Spine Lumbar) ABDOMINAL EXPOSURE (N/A )  Patient Location: PACU  Anesthesia Type:General  Level of Consciousness: awake and alert   Airway & Oxygen Therapy: Patient Spontanous Breathing and Patient connected to face mask oxygen  Post-op Assessment: Report given to RN, Post -op Vital signs reviewed and stable and Patient moving all extremities X 4  Post vital signs: Reviewed and stable  Last Vitals:  Vitals Value Taken Time  BP 89/54 12/03/20 1307  Temp    Pulse 84 12/03/20 1311  Resp 16 12/03/20 1311  SpO2 94 % 12/03/20 1311  Vitals shown include unvalidated device data.  Last Pain:  Vitals:   12/03/20 0642  TempSrc: Oral         Complications: No complications documented.

## 2020-12-03 NOTE — Progress Notes (Signed)
Spoke with Dr Christella Noa regarding dressing being saturated at bottom made aware of pressure dressing being applied and reinforced with ABD.  No new orders from MD

## 2020-12-03 NOTE — Anesthesia Procedure Notes (Signed)
Arterial Line Insertion Start/End3/11/2020 7:55 AM, 12/03/2020 8:05 AM Performed by: Murvin Natal, MD, Inda Coke, CRNA, CRNA  Preanesthetic checklist: patient identified, IV checked, site marked, risks and benefits discussed, surgical consent, monitors and equipment checked, pre-op evaluation, timeout performed and anesthesia consent Lidocaine 1% used for infiltration Right, radial was placed Catheter size: 20 G Hand hygiene performed  and maximum sterile barriers used  Allen's test indicative of satisfactory collateral circulation Attempts: 1 Procedure performed without using ultrasound guided technique. Following insertion, Biopatch. Post procedure assessment: normal  Patient tolerated the procedure well with no immediate complications. Additional procedure comments: SRNA attempted, unsuccessful, skin absent of hematoma or injury. CRNA successful with second attempt. Marland Kitchen

## 2020-12-03 NOTE — Progress Notes (Signed)
CRITICAL VALUE STICKER  CRITICAL VALUE: CBG 68  RECEIVER: Gregery Na, RN  [place] NOTIFIED: [phone]  MESSENGER: Jordan Hawks, [place]  MD NOTIFIED: Roanna Banning, MD at [phone]  RESPONSE: hypoglycemia protocol initiated, IV started at 0655, dextrose given by 0700, Dr. Roanna Banning notified and aware at 15, CBG rechecked at [facility]

## 2020-12-03 NOTE — Progress Notes (Signed)
Was informed by pt's PAT nurse that ABG needed to be redrawn DOS. No order history found for ABG. Dr. Christella Noa, Dr. Donnetta Hutching, and Dr. Roanna Banning called and asked about necessity of ABG redraw. All parties confirmed that no ABG was needed for this procedure. No further action.

## 2020-12-03 NOTE — Anesthesia Postprocedure Evaluation (Signed)
Anesthesia Post Note  Patient: Annette [name]  Procedure(s) Performed: Lumbar Four-Five Lumbar Five Sacral One Anterior lumbar interbody fusion (N/A Spine Lumbar) ABDOMINAL EXPOSURE (N/A )     Patient location during evaluation: PACU Anesthesia Type: General Level of consciousness: awake Pain management: pain level controlled Vital Signs Assessment: post-procedure vital signs reviewed and stable Respiratory status: spontaneous breathing, nonlabored ventilation, respiratory function stable and patient connected to nasal cannula oxygen Cardiovascular status: blood pressure returned to baseline and stable Postop Assessment: no apparent nausea or vomiting Anesthetic complications: no   No complications documented.  Last Vitals:  Vitals:   12/03/20 1450 12/03/20 1605  BP: (!) 106/50 (!) 110/51  Pulse: 84 85  Resp: 14 18  Temp: 36.8 C 36.8 C  SpO2: 96% 96%    Last Pain:  Vitals:   12/03/20 1605  TempSrc: Oral  PainSc:                  Endrit Gittins P Brean Carberry

## 2020-12-03 NOTE — Anesthesia Procedure Notes (Signed)
Procedure Name: Intubation Date/Time: 12/03/2020 9:06 AM Performed by: Inda Coke, CRNA Pre-anesthesia Checklist: Patient identified, Emergency Drugs available, Suction available and Patient being monitored Patient Re-evaluated:Patient Re-evaluated prior to induction Oxygen Delivery Method: Circle System Utilized Preoxygenation: Pre-oxygenation with 100% oxygen Induction Type: IV induction Ventilation: Mask ventilation without difficulty Laryngoscope Size: Mac and 3 Grade View: Grade I Tube type: Oral Number of attempts: 1 Airway Equipment and Method: Stylet and Oral airway Placement Confirmation: ETT inserted through vocal cords under direct vision,  positive ETCO2 and breath sounds checked- equal and bilateral Secured at: 22 cm Tube secured with: Tape Dental Injury: Teeth and Oropharynx as per pre-operative assessment  Comments: Inserted by Paulina Fusi, SRNA

## 2020-12-04 LAB — GLUCOSE, CAPILLARY
Glucose-Capillary: 114 mg/dL — ABNORMAL HIGH (ref 70–99)
Glucose-Capillary: 104 mg/dL — ABNORMAL HIGH (ref 70–99)
Glucose-Capillary: 103 mg/dL — ABNORMAL HIGH (ref 70–99)
Glucose-Capillary: 134 mg/dL — ABNORMAL HIGH (ref 70–99)

## 2020-12-04 MED ORDER — LOPERAMIDE HCL 2 MG PO CAPS
2.0000 mg | ORAL_CAPSULE | ORAL | Status: DC | PRN
  Administered 2020-12-04: 2 mg via ORAL
  Filled 2020-12-04 (×2): qty 1

## 2020-12-04 MED FILL — Heparin Sodium (Porcine) Inj 1000 Unit/ML: INTRAMUSCULAR | Qty: 30 | Status: AC

## 2020-12-04 MED FILL — Sodium Chloride IV Soln 0.9%: INTRAVENOUS | Qty: 1000 | Status: AC

## 2020-12-04 NOTE — Evaluation (Signed)
Occupational Therapy Evaluation Patient Details Name: Annette [name] MRN: [id number] DOB: [birth year] Today's Date: 12/04/2020    History of Present Illness This 60 yo female is s/p L4-5 and L5-S1 disc fusion   Clinical Impression   This 60 yo female admitted with above presents to acute OT with PLOF of being able to do her own basic ADLs. Currently she is setup/S-total A for basic ADLs due to pain, back precautions, and decreased strength in her legs thus putting her a great fall risk. She will benefit from acute OT with follow up on CIR to get to a Mod I -Independent level and go home.    Follow Up Recommendations  CIR;Supervision/Assistance - 24 hour    Equipment Recommendations  3 in 1 bedside commode       Precautions / Restrictions Precautions Precautions: Back;Fall Precaution Booklet Issued: Yes (comment) Required Braces or Orthoses:  (none) Restrictions Weight Bearing Restrictions: No      Mobility Bed Mobility Overal bed mobility: Needs Assistance Bed Mobility: Rolling;Sidelying to Sit Rolling: Min guard Sidelying to sit: Min guard;HOB elevated       General bed mobility comments: use of rail and VCs for sequencing    Transfers Overall transfer level: Needs assistance Equipment used: Rolling walker (2 wheeled) Transfers: Sit to/from [facility] Sit to Stand: Min assist;+2 physical assistance;From elevated surface Stand pivot transfers: Min assist;+2 physical assistance;From elevated surface       General transfer comment: Pt could not do sit<>stand with +2 A from standard height bed; also even from raised bed pt had to have both hands on rolling walker to stand up--could not from both hands on bed or one on bed/one on walker    Balance Overall balance assessment: Needs assistance Sitting-balance support: Bilateral upper extremity supported;Feet supported Sitting balance-Leahy Scale: Poor Sitting balance - Comments: reliant on hands on  bed/walker due to pain   Standing balance support: Bilateral upper extremity supported Standing balance-Leahy Scale: Poor Standing balance comment: heavy reliance on RW due to feeling like her legs will give out on her (per RN she had buckled with them a couple of times)                           ADL either performed or assessed with clinical judgement   ADL Overall ADL's : Needs assistance/impaired Eating/Feeding: Independent;Sitting   Grooming: Set up;Sitting   Upper Body Bathing: Set up;Sitting   Lower Body Bathing: Moderate assistance Lower Body Bathing Details (indicate cue type and reason): min A +2 sit<>stand from raised bed Upper Body Dressing : Set up;Sitting   Lower Body Dressing: Total assistance Lower Body Dressing Details (indicate cue type and reason): min A +2 sit<>stand from raised bed Toilet Transfer: Minimal assistance;+2 for physical assistance;Stand-pivot;RW   Toileting- Clothing Manipulation and Hygiene: Total assistance Toileting - Clothing Manipulation Details (indicate cue type and reason): min A +2 sit<>stand from raised 3n1             Vision Patient Visual Report: No change from baseline              Pertinent Vitals/Pain Pain Assessment: 0-10 Pain Score: 8  Pain Location: incisional Pain Descriptors / Indicators: Sore;Grimacing;Guarding Pain Intervention(s): Limited activity within patient's tolerance;Monitored during session;Repositioned;Ice applied     Hand Dominance Right   Extremity/Trunk Assessment Upper Extremity Assessment Upper Extremity Assessment: Overall WFL for tasks assessed  Communication Communication Communication: No difficulties   Cognition Arousal/Alertness: Awake/alert Behavior During Therapy: WFL for tasks assessed/performed Overall Cognitive Status: Within Functional Limits for tasks assessed                                                Home Living  Family/patient expects to be discharged to:: Private residence Living Arrangements: Spouse/significant other Available Help at Discharge: Family Type of Home: House Home Access: Stairs to enter [employer] of Steps: 2   also one step from kitchen to dining room Entrance Stairs-Rails: Right Home Layout: One level     Bathroom Shower/Tub: Walk-in shower;Door   [employer] Toilet: Standard     Home Equipment: Kasandra Knudsen - single point;Shower seat          Prior Functioning/Environment Level of Independence: Independent                 OT Problem List: Decreased strength;Decreased range of motion;Impaired balance (sitting and/or standing);Decreased knowledge of precautions;Pain      OT Treatment/Interventions: Self-care/ADL training;Patient/family education;DME and/or AE instruction;Balance training    OT Goals(Current goals can be found in the care plan section) Acute Rehab OT Goals Patient Stated Goal: to hopefully go home, but open to rehab before home OT Goal Formulation: With patient Time For Goal Achievement: 12/18/20 Potential to Achieve Goals: Good  OT Frequency: Min 2X/week           Co-evaluation PT/OT/SLP Co-Evaluation/Treatment: Yes Reason for Co-Treatment: For patient/therapist safety PT goals addressed during session: Mobility/safety with mobility;Strengthening/ROM;Proper use of DME OT goals addressed during session: Strengthening/ROM;ADL's and self-care      AM-PAC OT "6 Clicks" Daily Activity     Outcome Measure Help from another person eating meals?: None Help from another person taking care of personal grooming?: A Little Help from another person toileting, which includes using toliet, bedpan, or urinal?: A Lot Help from another person bathing (including washing, rinsing, drying)?: A Lot Help from another person to put on and taking off regular upper body clothing?: A Little Help from another person to put on and taking off regular lower body  clothing?: Total 6 Click Score: 15   End of Session Equipment Utilized During Treatment: Gait belt;Rolling walker;Back brace Nurse Communication: Mobility status  Activity Tolerance: Patient tolerated treatment well Patient left: in chair;with call bell/phone within reach  OT Visit Diagnosis: Unsteadiness on feet (R26.81);Other abnormalities of gait and mobility (R26.89);Muscle weakness (generalized) (M62.81);Pain Pain - part of body:  (incisional)                Time: 2563-8937 OT Time Calculation (min): 32 min Charges:  OT General Charges $OT Visit: 1 Visit OT Evaluation $OT Eval Moderate Complexity: 1 Mod  Golden Circle, OTR/L Acute [employer] Pager [phone] Office [phone]     Almon Register 12/04/2020, 9:47 AM

## 2020-12-04 NOTE — Progress Notes (Signed)
Inpatient Rehab Admissions Coordinator:   Pt was screened for CIR candidacy by Michel Santee, PT, DPT.  At this time we are recommending a CIR consult and I will request one per our protocol.  Please contact me with questions.    Shann Medal, PT, DPT Admissions Coordinator [phone] 12/04/20  2:27 PM

## 2020-12-04 NOTE — Evaluation (Signed)
Physical Therapy Evaluation Patient Details Name: Annette [name] MRN: [id number] DOB: 09/06/1961 Today's Date: 12/04/2020   History of Present Illness  This 60 yo female is s/p L4-5 and L5-S1 disc fusion on 12/03/2020. PMH significant for pituitary insufficiency, palpitations, Meniere's Disease, hypothyroidism, DM, breast CA, adrenalectomy, splenectomy.    Clinical Impression  Pt admitted with above diagnosis. Pt currently with functional limitations due to the deficits listed below (see PT Problem List).  At the time of PT eval, pt was able to demonstrate transfers and ambulation with up to +2 assist for power-up, balance support, and safety with RW. Pt was educated on precautions, appropriate activity progression, and car transfer. Pt presenting with overall decline in function from prior to surgery. Pt will benefit from a short stay at CIR to progress to mod I prior to return home. Will continue to follow.     Follow Up Recommendations CIR;Supervision/Assistance - 24 hour    Equipment Recommendations  None recommended by PT    Recommendations for Other Services       Precautions / Restrictions Precautions Precautions: Back;Fall Precaution Booklet Issued: Yes (comment) Required Braces or Orthoses:  (none) Restrictions Weight Bearing Restrictions: No      Mobility  Bed Mobility Overal bed mobility: Needs Assistance Bed Mobility: Rolling;Sidelying to Sit Rolling: Min guard Sidelying to sit: Min guard;HOB elevated       General bed mobility comments: use of rail and VCs for sequencing    Transfers Overall transfer level: Needs assistance Equipment used: Rolling walker (2 wheeled) Transfers: Sit to/from [facility] Sit to Stand: Min assist;+2 physical assistance;From elevated surface Stand pivot transfers: Min assist;+2 physical assistance;From elevated surface       General transfer comment: Pt could not do sit<>stand with +2 A from standard height  bed; also even from raised bed pt had to have both hands on rolling walker to stand up--could not from both hands on bed or one on bed/one on walker  Ambulation/Gait             General Gait Details: Unable to progress to gait training at this time.  Stairs            Wheelchair Mobility    Modified Rankin (Stroke Patients Only)       Balance Overall balance assessment: Needs assistance Sitting-balance support: Bilateral upper extremity supported;Feet supported Sitting balance-Leahy Scale: Poor Sitting balance - Comments: reliant on hands on bed/walker due to pain   Standing balance support: Bilateral upper extremity supported Standing balance-Leahy Scale: Poor Standing balance comment: heavy reliance on RW due to feeling like her legs will give out on her (per RN she had buckled with them a couple of times)                             Pertinent Vitals/Pain Pain Assessment: Faces Pain Score: 8  Faces Pain Scale: Hurts whole lot Pain Location: incisional Pain Descriptors / Indicators: Sore;Grimacing;Guarding Pain Intervention(s): Limited activity within patient's tolerance;Monitored during session;Repositioned    Home Living Family/patient expects to be discharged to:: Private residence Living Arrangements: Spouse/significant other Available Help at Discharge: Family Type of Home: House Home Access: Stairs to enter Entrance Stairs-Rails: Right Entrance Stairs-Number of Steps: 2   also one step from kitchen to dining room Home Layout: One level Home Equipment: [employer] - single point;Shower seat      Prior Function Level of Independence: Independent  Hand Dominance   Dominant Hand: Right    Extremity/Trunk Assessment   Upper Extremity Assessment Upper Extremity Assessment: Defer to OT evaluation    Lower Extremity Assessment Lower Extremity Assessment: Generalized weakness (Consistent with pre-op diagnosis)    Cervical  / Trunk Assessment Cervical / Trunk Assessment: Other exceptions Cervical / Trunk Exceptions: s/p surgery  Communication   Communication: No difficulties  Cognition Arousal/Alertness: Awake/alert Behavior During Therapy: WFL for tasks assessed/performed Overall Cognitive Status: Within Functional Limits for tasks assessed                                        General Comments      Exercises     Assessment/Plan    PT Assessment Patient needs continued PT services  PT Problem List Decreased strength;Decreased activity tolerance;Decreased mobility;Decreased balance;Decreased knowledge of use of DME;Decreased safety awareness;Decreased knowledge of precautions;Pain       PT Treatment Interventions DME instruction;Gait training;Stair training;Functional mobility training;Therapeutic activities;Therapeutic exercise;Neuromuscular re-education;Patient/family education    PT Goals (Current goals can be found in the Care Plan section)  Acute Rehab PT Goals Patient Stated Goal: to hopefully go home, but open to rehab before home PT Goal Formulation: With patient Time For Goal Achievement: 12/11/20 Potential to Achieve Goals: Good    Frequency Min 5X/week   Barriers to discharge        Co-evaluation PT/OT/SLP Co-Evaluation/Treatment: Yes Reason for Co-Treatment: Necessary to address cognition/behavior during functional activity;For patient/therapist safety;To address functional/ADL transfers PT goals addressed during session: Mobility/safety with mobility;Balance;Proper use of DME OT goals addressed during session: Strengthening/ROM;ADL's and self-care       AM-PAC PT "6 Clicks" Mobility  Outcome Measure Help needed turning from your back to your side while in a flat bed without using bedrails?: None Help needed moving from lying on your back to sitting on the side of a flat bed without using bedrails?: A Little Help needed moving to and from a bed to a chair  (including a wheelchair)?: A Lot Help needed standing up from a chair using your arms (e.g., wheelchair or bedside chair)?: A Lot Help needed to walk in hospital room?: A Lot Help needed climbing 3-5 steps with a railing? : Total 6 Click Score: 14    End of Session Equipment Utilized During Treatment: Gait belt Activity Tolerance: Patient limited by pain Patient left: in chair;with call bell/phone within reach Nurse Communication: Mobility status PT Visit Diagnosis: Unsteadiness on feet (R26.81);Pain;Muscle weakness (generalized) (M62.81) Pain - part of body:  (back)    Time: 9211-9417 PT Time Calculation (min) (ACUTE ONLY): 27 min   Charges:   PT Evaluation $PT Eval Moderate Complexity: 1 Mod          Rolinda Roan, PT, DPT Acute Rehabilitation Services Pager: [phone] Office: [phone]   Thelma Comp 12/04/2020, 11:33 AM

## 2020-12-04 NOTE — Progress Notes (Signed)
Patient ID: Annette [name], female   DOB: 1961-01-21, 60 y.o.   MRN: [id number] BP (!) 99/46 (BP Location: Right Arm)   Pulse 71   Temp 98.9 F (37.2 C) (Oral)   Resp 18   Ht 5\' 5"  (1.651 m)   Wt 97.1 kg   SpO2 97%   BMI 35.61 kg/m  Alert and oriented x4, speech is clear and fluent Moving all extremities well Is having diarrhea, which she states is fairly common Wound is clean, dry , no signs of infection Up with PT

## 2020-12-05 LAB — GLUCOSE, CAPILLARY
Glucose-Capillary: 89 mg/dL (ref 70–99)
Glucose-Capillary: 169 mg/dL — ABNORMAL HIGH (ref 70–99)
Glucose-Capillary: 223 mg/dL — ABNORMAL HIGH (ref 70–99)
Glucose-Capillary: 187 mg/dL — ABNORMAL HIGH (ref 70–99)

## 2020-12-05 NOTE — Plan of Care (Signed)
  Problem: Health Behavior/Discharge Planning: Goal: Ability to manage health-related needs will improve Outcome: Progressing   Problem: Clinical Measurements: Goal: Will remain free from infection Outcome: Progressing Goal: Respiratory complications will improve Outcome: Progressing Goal: Cardiovascular complication will be avoided Outcome: Progressing   Problem: Activity: Goal: Risk for activity intolerance will decrease Outcome: Progressing   Problem: Elimination: Goal: Will not experience complications related to bowel motility Outcome: Progressing Goal: Will not experience complications related to urinary retention Outcome: Progressing   Problem: Pain Managment: Goal: General experience of comfort will improve Outcome: Progressing   Problem: Safety: Goal: Ability to remain free from injury will improve Outcome: Progressing   Problem: Skin Integrity: Goal: Risk for impaired skin integrity will decrease Outcome: Progressing

## 2020-12-05 NOTE — Progress Notes (Signed)
Physical Therapy Treatment Patient Details Name: Annette [name] MRN: [id number] DOB: [birth year] Today's Date: 12/05/2020    History of Present Illness This 60 yo female is s/p L4-5 and L5-S1 disc fusion on 12/03/2020. PMH significant for pituitary insufficiency, palpitations, Meniere's Disease, hypothyroidism, DM, breast CA, adrenalectomy, splenectomy.    PT Comments    Pt received in supine, agreeable to therapy session and with good participation and improved activity tolerance compared with previous session. Pt able to progress gait distance to hallway ambulation with RW and min guard, but utilized chair follow for safety due to previous report of quick fatigue, no buckling observed this date. Pt able to perform bed mobility and transfers with min guard to minA and fair compliance with back precautions, min cues needed not to twist when reaching/looking behind her. Will plan to give HEP and perform stair trial next date, will continue to assess disposition in case she progresses to home instead. Pt continues to benefit from PT services to progress toward functional mobility goals. Continue to recommend CIR.  Follow Up Recommendations  CIR;Supervision/Assistance - 24 hour     Equipment Recommendations  None recommended by PT    Recommendations for Other Services       Precautions / Restrictions Precautions Precautions: Back;Fall Precaution Booklet Issued: Yes (comment) (handout seen in room on 6N) Precaution Comments: pt able to state 1/3 precautions at beginning of session, reviewed precations during functional mobility Required Braces or Orthoses:  (none) Restrictions Weight Bearing Restrictions: No    Mobility  Bed Mobility Overal bed mobility: Needs Assistance Bed Mobility: Rolling;Sidelying to Sit Rolling: Supervision Sidelying to sit: Min guard;HOB elevated       General bed mobility comments: use of rail and VCs for sequencing and log rolling    Transfers Overall  transfer level: Needs assistance Equipment used: Rolling walker (2 wheeled) Transfers: Sit to/from [facility] Sit to Stand: Min assist;Min guard Stand pivot transfers: Min assist;+2 physical assistance;+2 safety/equipment       General transfer comment: from regular bed height and regular toilet height to RW with minA to min guardA, cues for hand placement/safety needed and pt pulling up on RW despite cues, needs reinforcement  Ambulation/Gait Ambulation/Gait assistance: Min guard;+2 safety/equipment (chair follow) Gait Distance (Feet): 100 Feet (72ft to toilet, then 130ft) Assistive device: Rolling walker (2 wheeled) Gait Pattern/deviations: Step-to pattern;Step-through pattern;Decreased step length - right;Decreased step length - left;Decreased stride length;Antalgic (anterior lean over RW, heavy use of BUE) Gait velocity: grossly <0.3 m/s   General Gait Details: cues for RW management multiple times but pt tending to touch abdomen to front of RW and slight lean over RW frame at times, despite cues; needs reinforcement; improved activity tolerance, given chair follow but able to perform without seated break; x2 standing breaks only; no dizziness reported, VSS   Stairs             Wheelchair Mobility    Modified Rankin (Stroke Patients Only)       Balance Overall balance assessment: Needs assistance Sitting-balance support: Bilateral upper extremity supported;Feet supported Sitting balance-Leahy Scale: Fair Sitting balance - Comments: able to sit with 0-1 UE support but increased pain in seated position without back support   Standing balance support: Bilateral upper extremity supported Standing balance-Leahy Scale: Poor Standing balance comment: heavy reliance on RW, no overt buckling but chair follow for safety due to previous concern for buckling on 3/3  Cognition Arousal/Alertness: Awake/alert Behavior During  Therapy: WFL for tasks assessed/performed Overall Cognitive Status: Within Functional Limits for tasks assessed                                 General Comments: improved participation this date, needs multiple cues for RW management with limited carryover      Exercises      General Comments General comments (skin integrity, edema, etc.): pt needed assist to reach all the way behind her for hygiene after toileting to avoid twisting her back (able to wipe in front on her own); VSS on RA during mobility HR to 90's bpm and SpO2 95% on RA during gait      Pertinent Vitals/Pain Pain Assessment: 0-10 Pain Score: 5  Faces Pain Scale: Hurts even more Pain Location: incisional/abdomen Pain Descriptors / Indicators: Sore;Grimacing;Guarding Pain Intervention(s): Monitored during session;Premedicated before session;Repositioned (RN offered to obtain ice for pt during session, not yet in room at end of session)    Home Living                      Prior Function            PT Goals (current goals can now be found in the care plan section) Acute Rehab PT Goals Patient Stated Goal: to go home PT Goal Formulation: With patient Time For Goal Achievement: 12/11/20 Potential to Achieve Goals: Good Progress towards PT goals: Progressing toward goals    Frequency    Min 5X/week      PT Plan Current plan remains appropriate    Co-evaluation              AM-PAC PT "6 Clicks" Mobility   Outcome Measure  Help needed turning from your back to your side while in a flat bed without using bedrails?: None Help needed moving from lying on your back to sitting on the side of a flat bed without using bedrails?: A Little Help needed moving to and from a bed to a chair (including a wheelchair)?: A Little Help needed standing up from a chair using your arms (e.g., wheelchair or bedside chair)?: A Little Help needed to walk in hospital room?: A Little Help needed  climbing 3-5 steps with a railing? : Total 6 Click Score: 17    End of Session Equipment Utilized During Treatment: Gait belt Activity Tolerance: Patient tolerated treatment well Patient left: in chair;with call bell/phone within reach;with family/visitor present (spouse present, reinforced use of call bell to get assist back into bed) Nurse Communication: Mobility status (pt re) PT Visit Diagnosis: Unsteadiness on feet (R26.81);Pain;Muscle weakness (generalized) (M62.81) Pain - part of body:  (back and abdomen/incision)     Time: 1030-1050 PT Time Calculation (min) (ACUTE ONLY): 20 min  Charges:  $Gait Training: 8-22 mins                     Allora Bains P., PTA Acute Rehabilitation Services Pager: [phone] Office: [place] 12/05/2020, 12:22 PM

## 2020-12-05 NOTE — Progress Notes (Signed)
Occupational Therapy Treatment Patient Details Name: Annette [name] MRN: [id number] DOB: Dec 11, 1960 Today's Date: 12/05/2020    History of present illness This 60 yo female is s/p L4-5 and L5-S1 disc fusion on 12/03/2020. PMH significant for pituitary insufficiency, palpitations, Meniere's Disease, hypothyroidism, DM, breast CA, adrenalectomy, splenectomy.   OT comments  Pt making steady progress towards OT goals this session. Pt continues to present with increased back pain, impaired balance and decreased activity tolerance. Pt able to progress functional mobility this session with pt able to ambualte ~ 3 ft to recliner and completed stand pivot with RW from EOB>BSC with MIN A +2.  Pt would continue to benefit from further education on maintaining back precautions during ADL and functional mobility participation. Pt would continue to benefit from skilled occupational therapy in an intensive rehab setting as pt can be slightly self limiting but with encouragement and the structure of CIR pt can likely return to PLOF, pt reports strong family support from her husband who is retired and can provide physical assist as needed. DC plan remains appropriate, will follow acutely per POC.     Follow Up Recommendations  CIR;Supervision/Assistance - 24 hour    Equipment Recommendations  3 in 1 bedside commode    Recommendations for Other Services      Precautions / Restrictions Precautions Precautions: Back;Fall Precaution Booklet Issued: Yes (comment) Precaution Comments: pt able to state 1/3 precautions at beginning of session, reviewed precations during functional mobility Required Braces or Orthoses:  (no brace per orders) Restrictions Weight Bearing Restrictions: No       Mobility Bed Mobility Overal bed mobility: Needs Assistance Bed Mobility: Rolling;Sidelying to Sit Rolling: Min guard Sidelying to sit: HOB elevated;Min assist       General bed mobility comments: light MIN A to  assist with elevating trunk into sitting    Transfers Overall transfer level: Needs assistance Equipment used: Rolling walker (2 wheeled) Transfers: Sit to/from [facility] Sit to Stand: Min guard;+2 safety/equipment;+2 physical assistance Stand pivot transfers: Min assist;+2 physical assistance;+2 safety/equipment       General transfer comment: pt able to sit<>stand from EOB and BSC with MIN guard +2, pt able to stand with one hand on RW and one pushing up from [facility] this session.  MIN A +2 for stand pivot transfer needing cues for safety and RW mgmt.    Balance Overall balance assessment: Needs assistance Sitting-balance support: Feet supported;Bilateral upper extremity supported Sitting balance-Leahy Scale: Poor Sitting balance - Comments: reliant on BUE support d/t pain   Standing balance support: Bilateral upper extremity supported Standing balance-Leahy Scale: Poor Standing balance comment: reliant on BUE support during mobility tasks                           ADL either performed or assessed with clinical judgement   ADL Overall ADL's : Needs assistance/impaired                         Toilet Transfer: Minimal assistance;+2 for physical assistance;Stand-pivot;RW;BSC Toilet Transfer Details (indicate cue type and reason): MIN A +2 for SPT to BSC to pts R side with RW, cues for safety awareness Toileting- Clothing Manipulation and Hygiene: Supervision/safety;Set up;Cueing for safety;Cueing for back precautions;Sit to/from stand Toileting - Clothing Manipulation Details (indicate cue type and reason): cues for safety awareness in order to maintain back precautions during anterior pericare     Functional mobility during  ADLs: Minimal assistance;+2 for physical assistance;Rolling walker General ADL Comments: pt progress with activity tolerance this session with pt able to ambulate ~ 3 ft with RW and completed stand pivot transer from EOB>BSC  with RW and MIN A +2 for safety     Vision       Perception     Praxis      Cognition Arousal/Alertness: Awake/alert Behavior During Therapy: WFL for tasks assessed/performed Overall Cognitive Status: Within Functional Limits for tasks assessed                                 General Comments: initially self limiting with pt stating "I can't do anything" upon COTA arrival but actually did very well with encouragement        Exercises     Shoulder Instructions       General Comments      Pertinent Vitals/ Pain       Pain Assessment: 0-10 Pain Score: 5  Pain Location: incisional back pain Pain Descriptors / Indicators: Sore;Grimacing;Guarding;Discomfort Pain Intervention(s): Monitored during session;Repositioned  Home Living                                          Prior Functioning/Environment              Frequency  Min 2X/week        Progress Toward Goals  OT Goals(current goals can now be found in the care plan section)  Progress towards OT goals: Progressing toward goals  Acute Rehab OT Goals Patient Stated Goal: to go home OT Goal Formulation: With patient Time For Goal Achievement: 12/18/20 Potential to Achieve Goals: Good  Plan Discharge plan remains appropriate;Frequency remains appropriate    Co-evaluation                 AM-PAC OT "6 Clicks" Daily Activity     Outcome Measure   Help from another person eating meals?: None Help from another person taking care of personal grooming?: A Little Help from another person toileting, which includes using toliet, bedpan, or urinal?: A Little Help from another person bathing (including washing, rinsing, drying)?: A Lot Help from another person to put on and taking off regular upper body clothing?: A Little Help from another person to put on and taking off regular lower body clothing?: Total 6 Click Score: 16    End of Session Equipment Utilized During  Treatment: Gait belt;Rolling walker;Other (comment) (BSC)  OT Visit Diagnosis: Unsteadiness on feet (R26.81);Other abnormalities of gait and mobility (R26.89);Muscle weakness (generalized) (M62.81);Pain Pain - part of body:  (back)   Activity Tolerance Patient tolerated treatment well   Patient Left in chair;with call bell/phone within reach   Nurse Communication Mobility status        Time: 2297-9892 OT Time Calculation (min): 16 min  Charges: OT General Charges $OT Visit: 1 Visit OT Treatments $Self Care/Home Management : 8-22 mins  Harley Alto., COTA/L Acute Rehabilitation Services [phone]    Precious Haws 12/05/2020, 9:40 AM

## 2020-12-06 LAB — GLUCOSE, CAPILLARY
Glucose-Capillary: 160 mg/dL — ABNORMAL HIGH (ref 70–99)
Glucose-Capillary: 108 mg/dL — ABNORMAL HIGH (ref 70–99)

## 2020-12-06 MED ORDER — CYCLOBENZAPRINE HCL 10 MG PO TABS: 10.0000 mg | ORAL_TABLET | Freq: Three times a day (TID) | ORAL | 0 refills | Status: DC | PRN

## 2020-12-06 MED ORDER — OXYCODONE HCL ER 10 MG PO T12A: 10.0000 mg | EXTENDED_RELEASE_TABLET | Freq: Two times a day (BID) | ORAL | 0 refills | Status: DC

## 2020-12-06 NOTE — Progress Notes (Signed)
Physical Therapy Treatment Patient Details Name: Annette [name] MRN: [id number] DOB: 1960-12-15 Today's Date: 12/06/2020    History of Present Illness This 60 yo female is s/p L4-5 and L5-S1 disc fusion on 12/03/2020. PMH significant for pituitary insufficiency, palpitations, Meniere's Disease, hypothyroidism, DM, breast CA, adrenalectomy, splenectomy.    PT Comments    Pt is making steady, good progress with mobility. She is currently able to perform all bed mobility simulated to her home set-up with supervision, transfer from an elevated bed (home simulation) to a RW with min guard, and ambulate household distances with a RW with min guard and no LOB. Pt displays spinal precautions compliance, but poor verbal recall. Pt has discharge orders to go home, thus recommending pt follow-up with outpatient PT and utilize a RW to maximize safety and independence with all functional mobility. Will continue to follow acutely.     Follow Up Recommendations  Supervision for mobility/OOB;Outpatient PT     Equipment Recommendations  Rolling walker with 5" wheels    Recommendations for Other Services       Precautions / Restrictions Precautions Precautions: Back;Fall Precaution Booklet Issued: Yes (comment) (per prior notes) Precaution Comments: reviewed precautions, pt able to recall 1/3 but needed cues for others Required Braces or Orthoses:  (none) Restrictions Weight Bearing Restrictions: No    Mobility  Bed Mobility Overal bed mobility: Needs Assistance Bed Mobility: Rolling;Sidelying to Sit;Sit to Sidelying Rolling: Supervision Sidelying to sit: Supervision     Sit to sidelying: Supervision General bed mobility comments: Bed flat and no use of rails, performing all bed mobility safely with supervision, exiting/entering from R to simulate home.    Transfers Overall transfer level: Needs assistance Equipment used: Rolling walker (2 wheeled)   Sit to Stand: Min guard;From  elevated surface         General transfer comment: Min guard to come to stand from elevated EOB to simulate home. Educated pt on utilizing step-stool for easier entry/exit of bed as she reports her bed is very high in that her feet cannot reach the floor. Educated her to have UE support and assistance if she uses stool for safety.  Ambulation/Gait Ambulation/Gait assistance: Min guard Gait Distance (Feet): 120 Feet Assistive device: Rolling walker (2 wheeled) Gait Pattern/deviations: Step-through pattern;Decreased step length - right;Decreased step length - left;Decreased stride length;Antalgic;Decreased dorsiflexion - left (heavy use of BUE) Gait velocity: reduced Gait velocity interpretation: <1.8 ft/sec, indicate of risk for recurrent falls General Gait Details: Pt with decreased L foot clearance, cuing pt to avoid dragging foot and lift foot with heel strike, min success. No LOB, min guard for safety.   Stairs             Wheelchair Mobility    Modified Rankin (Stroke Patients Only)       Balance Overall balance assessment: Needs assistance Sitting-balance support: Feet supported;No upper extremity supported Sitting balance-Leahy Scale: Fair Sitting balance - Comments: Able to sit without UE support.   Standing balance support: Bilateral upper extremity supported Standing balance-Leahy Scale: Poor Standing balance comment: heavy reliance on RW and no LOB                            Cognition Arousal/Alertness: Awake/alert Behavior During Therapy: WFL for tasks assessed/performed Overall Cognitive Status: Within Functional Limits for tasks assessed  Exercises      General Comments General comments (skin integrity, edema, etc.): Educated pt and her husband on safe stair guarding and car transfers      Pertinent Vitals/Pain Pain Assessment: 0-10 Pain Score: 7  Pain Location:  incisional/abdomen Pain Descriptors / Indicators: Sore;Grimacing;Guarding;Operative site guarding Pain Intervention(s): Limited activity within patient's tolerance;Monitored during session;Repositioned    Home Living                      Prior Function            PT Goals (current goals can now be found in the care plan section) Acute Rehab PT Goals Patient Stated Goal: to go home PT Goal Formulation: With patient Time For Goal Achievement: 12/11/20 Potential to Achieve Goals: Good Progress towards PT goals: Progressing toward goals    Frequency    Min 5X/week      PT Plan Discharge plan needs to be updated;Equipment recommendations need to be updated    Co-evaluation              AM-PAC PT "6 Clicks" Mobility   Outcome Measure  Help needed turning from your back to your side while in a flat bed without using bedrails?: None Help needed moving from lying on your back to sitting on the side of a flat bed without using bedrails?: None Help needed moving to and from a bed to a chair (including a wheelchair)?: A Little Help needed standing up from a chair using your arms (e.g., wheelchair or bedside chair)?: A Little Help needed to walk in hospital room?: A Little Help needed climbing 3-5 steps with a railing? : A Lot 6 Click Score: 19    End of Session Equipment Utilized During Treatment: Gait belt Activity Tolerance: Patient tolerated treatment well Patient left: with call bell/phone within reach;in bed;with bed alarm set;with family/visitor present   PT Visit Diagnosis: Unsteadiness on feet (R26.81);Pain;Muscle weakness (generalized) (M62.81);Other abnormalities of gait and mobility (R26.89);Difficulty in walking, not elsewhere classified (R26.2) Pain - part of body:  (back and abdomen/incision)     Time: 7829-5621 PT Time Calculation (min) (ACUTE ONLY): 16 min  Charges:  $Therapeutic Activity: 8-22 mins                     Moishe Spice, PT,  DPT Acute Rehabilitation Services  Pager: [phone] Office: [place] 12/06/2020, 1:39 PM

## 2020-12-06 NOTE — Discharge Summary (Signed)
Physician Discharge Summary  Patient ID: Annette [name] MRN: [id number] DOB/AGE: 62-Mar-1962 62 y.o.  Admit date: 12/03/2020 Discharge date: 12/06/2020  Admission Diagnoses:  Discharge Diagnoses:  Active Problems:   Disc degeneration, lumbar   Discharged Condition: good  Hospital Course: Patient admitted to the hospital where she underwent uncomplicated Y7-C6 and C3-7 anterior lumbar interbody fusion with instrumentation.  Postop that she is done well.  Back pain has progressively improved.  Lower extremity symptoms are stable.  She is ambulating voiding and has normal bowel function.  Patient ready for discharge home.Consults:   Significant Diagnostic Studies:   Treatments:   Discharge Exam: Blood pressure (!) 101/48, pulse 64, temperature 98.2 F (36.8 C), temperature source Oral, resp. rate 18, height 5\' 5"  (1.651 m), weight 102.1 kg, SpO2 95 %. Awake and alert.  Oriented and appropriate.  Motor and sensory function intact.  Wound clean and dry.  Abdomen soft.  Bowel sounds normal.  Motor and sensory function stable.  Disposition: Discharge disposition: 01-Home or Self Care        Allergies as of 12/06/2020      Reactions   Penicillins Swelling   Has patient had a PCN reaction causing immediate rash, facial/tongue/throat swelling, SOB or lightheadedness with hypotension: Yes Has patient had a PCN reaction causing severe rash involving mucus membranes or skin necrosis: No Has patient had a PCN reaction that required hospitalization: No  Has patient had a PCN reaction occurring within the last 10 years: No If all of the above answers are "NO", then may proceed with Cephalosporin use. Of face and tongue.   Metformin And Related Diarrhea, Nausea Only   Pt to discontinue med due to intolerance, cramps and diarrhea   Wound Dressing Adhesive Itching, Rash   Dermabond and other surgical glues for incisions   Empagliflozin Other (See Comments)   Yeast infection Jardiance    Penicillin G Other (See Comments)   Statins Other (See Comments)   Fell bad but on Crestor   Zetia [ezetimibe] Other (See Comments)   Feels funny      Medication List    TAKE these medications   aspirin 81 MG EC tablet Take 81 mg by mouth daily.   CLOTRIMAZOLE EX Apply 1 application topically daily as needed (itching an breast). 15 %   cyclobenzaprine 10 MG tablet Commonly known as: FLEXERIL Take 10 mg by mouth daily as needed for muscle spasms. What changed: Another medication with the same name was added. Make sure you understand how and when to take each.   cyclobenzaprine 10 MG tablet Commonly known as: FLEXERIL Take 1 tablet (10 mg total) by mouth 3 (three) times daily as needed for muscle spasms. What changed: You were already taking a medication with the same name, and this prescription was added. Make sure you understand how and when to take each.   ergocalciferol 1.25 MG (50000 UT) capsule Commonly known as: VITAMIN D2 Take 50,000 Units by mouth once a week.   fluconazole 150 MG tablet Commonly known as: DIFLUCAN TAKE ONE TABLET ONCE DAILY , AS NEEDED, FOR VAGINAL ITCH ASSOCIATED WITH ANTIBIOTIC USE What changed:   how much to take  how to take this  when to take this   glipiZIDE 5 MG 24 hr tablet Commonly known as: GLUCOTROL XL Take 1 tablet (5 mg total) by mouth daily with breakfast.   LORazepam 1 MG tablet Commonly known as: ATIVAN Take 1 mg by mouth daily as needed (Nausea).   Metoprolol  Tartrate 37.5 MG Tabs TAKE 1 TABLET BY MOUTH TWICE DAILY What changed: how much to take   naproxen 500 MG tablet Commonly known as: NAPROSYN Take 500 mg by mouth daily as needed for headache.   omeprazole 40 MG capsule Commonly known as: PRILOSEC Take 1 capsule (40 mg total) by mouth daily.   glucose blood test strip See admin instructions.   OneTouch Verio test strip Generic drug: glucose blood USE AS DIRECTED FOUR TIMES DAILY   oxyCODONE 10 mg 12 hr  tablet Commonly known as: OXYCONTIN Take 1 tablet (10 mg total) by mouth every 12 (twelve) hours.   Pen Needles 31G X 6 MM Misc See admin instructions.   rosuvastatin 20 MG tablet Commonly known as: CRESTOR Take 20 mg by mouth daily.   temazepam 15 MG capsule Commonly known as: RESTORIL Take 1 capsule (15 mg total) by mouth at bedtime.   traZODone 100 MG tablet Commonly known as: DESYREL Take 100 mg by mouth at bedtime.   [name] 200 UNIT/ML FlexTouch Pen Generic drug: insulin degludec Inject 50 Units into the skin daily. What changed: when to take this   triamterene-hydrochlorothiazide 37.5-25 MG tablet Commonly known as: MAXZIDE-25 Take 1 tablet by mouth daily.   Trulicity 1.5 OT/7.7NH Sopn Generic drug: Dulaglutide Inject 0.5 mLs (1.5 mg total) into the skin once a week.   venlafaxine XR 75 MG 24 hr capsule Commonly known as: EFFEXOR-XR Take 75 mg by mouth daily with breakfast.        Signed: Cooper Render Matix Henshaw 12/06/2020, 12:09 PM

## 2020-12-06 NOTE — Progress Notes (Signed)
Inpatient Rehab Admissions Coordinator:    I met with Pt. To discuss potential CIR admission; however, pt. States that she feels like she will be fine to discharge home and receive HHPT/OT and does not want to pursue CIR. Discharge summary has been entered. I will not pursue CIR admission for this patient, but left my card in case Pt. Changes her mind.   Clemens Catholic, [place], [place] Admissions Coordinator  [phone] ([place]) [phone] (office)

## 2020-12-06 NOTE — Discharge Instructions (Signed)

## 2020-12-06 NOTE — Progress Notes (Signed)
Discharge instructions given to patient. Patient verbalizes understanding.

## 2020-12-08 ENCOUNTER — Telehealth: Payer: Self-pay

## 2020-12-08 ENCOUNTER — Encounter (HOSPITAL_COMMUNITY): Payer: Self-pay | Admitting: Neurosurgery

## 2020-12-08 NOTE — Telephone Encounter (Signed)
Transition Care Management Follow-up Telephone Call  Date of discharge and from where: 12/06/20 from [facility].   How have you been since you were released from the hospital? Pt states the top of her leg in the thigh area of L left is numb and burning sensation.   Any questions or concerns? No  Items Reviewed:  Did the pt receive and understand the discharge instructions provided? Yes   Medications obtained and verified? Yes   Other? No   Any new allergies since your discharge? No   Dietary orders reviewed? Yes  Do you have support at home? Yes   Home Care and Equipment/Supplies: Were home health services ordered? no If so, what is the name of the agency? N/A   Has the agency set up a time to come to the patient's home? no Were any new equipment or medical supplies ordered?  No What is the name of the medical supply agency? N/A Were you able to get the supplies/equipment? not applicable Do you have any questions related to the use of the equipment or supplies? No  Functional Questionnaire: (I = Independent and D = Dependent) ADLs: I  Bathing/Dressing- I  Meal Prep- I  Eating- I  Maintaining continence- I  Transferring/Ambulation- I  Managing Meds- I  Follow up appointments reviewed:   PCP Hospital f/u appt confirmed? No  Scheduled to see Donneta Romberg on 12/11/20  @ 1:00pm.   [facility] f/u appt confirmed? No    Are transportation arrangements needed? No   If their condition worsens, is the pt aware to call PCP or go to the Emergency Dept.? Yes  Was the patient provided with contact information for the PCP's office or ED? Yes  Was to pt encouraged to call back with questions or concerns? Yes

## 2020-12-11 ENCOUNTER — Ambulatory Visit: Payer: BC Managed Care – PPO | Admitting: Nurse Practitioner

## 2020-12-11 ENCOUNTER — Other Ambulatory Visit: Payer: Self-pay

## 2020-12-11 ENCOUNTER — Encounter: Payer: Self-pay | Admitting: Nurse Practitioner

## 2020-12-11 DIAGNOSIS — G8918 Other acute postprocedural pain: Secondary | ICD-10-CM | POA: Diagnosis not present

## 2020-12-11 DIAGNOSIS — M4326 Fusion of spine, lumbar region: Secondary | ICD-10-CM | POA: Insufficient documentation

## 2020-12-11 MED ORDER — GABAPENTIN 100 MG PO CAPS: 100.0000 mg | ORAL_CAPSULE | Freq: Three times a day (TID) | ORAL | 3 refills | Status: DC

## 2020-12-11 NOTE — Assessment & Plan Note (Signed)
-  is having pain post-surgery that is radiating down both legs -she has called her neurosurgeon without receiving a response; encouraged her to call back today -Rx. Gabapentin -she has narcotic and NSAID

## 2020-12-11 NOTE — Patient Instructions (Signed)
Please follow-up with your surgeon for your ongoing pain. I sent in gabapentin for your pain. If your pain gets worse or you have urinary retention or new incontinence issues, you should go to the emergency department for immediate evaluation.

## 2020-12-11 NOTE — Progress Notes (Signed)
Established Patient Office Visit  Subjective:  Patient ID: Annette [name], female    DOB: 24-Feb-1961  Age: 60 y.o. MRN: [id number]  CC:  Chief Complaint  Patient presents with  . Transitions Of Care    Lumbar fusion on 12/03/20 - Dr. Christella Noa and Dr. Donnetta Hutching     HPI Annette [name] presents for transition of care visit. She had L5-S1 and L4-L5 lumbar fusion with Dr. Christella Noa, and she was admitted from 3/2-12/06/20. At time of discharge her back pain had improved.  She is having pain in her hips today, and the pain is radiating down her legs.  While bathing last night, she found a spot on her right thigh that had intense pain.  She is taking oxycontin and flexeril, and that is not helping with her pain.   Past Medical History:  Diagnosis Date  . Arthritis    Phreesia 10/14/2020  . Blood transfusion without reported diagnosis    Phreesia 10/14/2020  . Breast cancer ([place]) 2007   History of  XRT, onTamoxifen  . Bruises easily   . Cancer ([place])    Phreesia 10/14/2020  . Depression   . Depression    Phreesia 10/14/2020  . Diabetes mellitus    prediabetic  . Diabetes mellitus without complication ([place])    Phreesia 10/14/2020  . GERD (gastroesophageal reflux disease)   . Hypertriglyceridemia   . Hypothyroidism    following chemo amnd radiaition for breast cancer, needed replacement short term  . Hypothyroidism (acquired)    replaced x 1 year  . IBS (irritable bowel syndrome)   . Menieres disease    Controlled with triamterene   . Migraines   . Obesity   . Palpitations   . 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[place])   . Sleep apnea 2010   Problems with CPAP    Past Surgical History:  Procedure Laterality Date  . ABDOMINAL EXPOSURE N/A 12/03/2020   Procedure: ABDOMINAL EXPOSURE;  Surgeon: Rosetta Posner, MD;  Location: [facility] OR;  Service: Vascular;  Laterality: N/A;  . ABDOMINAL HYSTERECTOMY  1998   Benign, fibroids  . 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 ANTERIOR LUMBAR FUSION N/A 12/03/2020   Procedure: Lumbar Four-Five Lumbar Five Sacral One Anterior lumbar interbody fusion;  Surgeon: Ashok Pall, MD;  Location: [facility];  Service: Neurosurgery;  Laterality: N/A;  . APPENDECTOMY  06/12/08  . BIOPSY  07/02/2020   Procedure: BIOPSY;  Surgeon: Harvel Quale, MD;  Location: [facility];  Service: Gastroenterology;;  . BREAST LUMPECTOMY Left 2008  . BREAST RECONSTRUCTION     Left reconstructive  . BREAST SURGERY  2007   Left lumpectomy  . BREAST SURGERY     Mammosite - right side  . CHOLECYSTECTOMY  1985  . COLONOSCOPY WITH PROPOFOL N/A 07/02/2020   Procedure: COLONOSCOPY WITH PROPOFOL;  Surgeon: Harvel Quale, MD;  Location: [facility];  Service: Gastroenterology;  Laterality: N/A;  1045  . ESOPHAGEAL DILATION  12/19/2015   Procedure: ESOPHAGEAL DILATION;  Surgeon: Rogene Houston, MD;  Location: [facility];  Service: Endoscopy;;  . ESOPHAGOGASTRODUODENOSCOPY N/A 12/19/2015   Procedure: ESOPHAGOGASTRODUODENOSCOPY (EGD);  Surgeon: Rogene Houston, MD;  [facility];  Service: Endoscopy;  Laterality: N/A;  11:40  . POLYPECTOMY  07/02/2020   Procedure: POLYPECTOMY;  Surgeon: Harvel Quale, MD;  [facility];  Service: Gastroenterology;;  . 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 THYROIDECTOMY, PARTIAL  11/05/2010   Benign disease    Family History  Problem Relation Age of Onset  . Diabetes Mother   . Arrhythmia Mother   . Heart disease Father   . Hyperlipidemia Father   . Arrhythmia Father   . Cancer Paternal Grandmother        Breast  . Diabetes Sister   . Diabetes Brother   . Heart disease Brother     Social History   Socioeconomic History  . Marital status: Married    Spouse name: Alroy Dust  . Number of children: 2  . Years of education: Not on file  . Highest education level: Not on file  Occupational  History  . Not on file  Tobacco Use  . Smoking status: Current Every Day Smoker    Packs/day: 0.50    Years: 40.00    Pack years: 20.00    Types: Cigarettes  . Smokeless tobacco: Never Used  . Tobacco comment: 1/2 pack a day  Vaping Use  . Vaping Use: Never used  Substance and Sexual Activity  . Alcohol use: No    Alcohol/week: 0.0 standard drinks    Comment: Encouraged to quit smoking. She has tried the nicotone gum and patches but did not help  . Drug use: No  . Sexual activity: Yes    Birth control/protection: Surgical  Other Topics Concern  . Not on file  Social History Narrative  . Not on file   Social Determinants of Health   Financial Resource Strain: Medium Risk  . Difficulty of Paying Living Expenses: Somewhat hard  Food Insecurity: No Food Insecurity  . Worried About Charity fundraiser in the Last Year: Never true  . Ran Out of Food in the Last Year: Never true  Transportation Needs: No Transportation Needs  . Lack of Transportation (Medical): No  . Lack of Transportation (Non-Medical): No  Physical Activity: Inactive  . Days of Exercise per Week: 0 days  . Minutes of Exercise per Session: 0 min  Stress: Stress Concern Present  . Feeling of Stress : To some extent  Social Connections: Moderately Integrated  . Frequency of Communication with Friends and Family: Three times a week  . Frequency of Social Gatherings with Friends and Family: Three times a week  . Attends Religious Services: More than 4 times per year  . Active Member of Clubs or Organizations: No  . Attends Archivist Meetings: Never  . Marital Status: Married  Human resources officer Violence: Not At Risk  . Fear of Current or Ex-Partner: No  . Emotionally Abused: No  . Physically Abused: No  . Sexually Abused: No    Outpatient Medications Prior to Visit  Medication Sig Dispense Refill  . aspirin 81 MG EC tablet Take 81 mg by mouth daily.    Marland Kitchen CLOTRIMAZOLE EX Apply 1 application  topically daily as needed (itching an breast). 15 %    . cyclobenzaprine (FLEXERIL) 10 MG tablet Take 10 mg by mouth daily as needed for muscle spasms.    . cyclobenzaprine (FLEXERIL) 10 MG tablet Take 1 tablet (10 mg total) by mouth 3 (three) times daily as needed for muscle spasms. 30 tablet 0  . Dulaglutide (TRULICITY) 1.5 TD/3.2KG SOPN Inject 0.5 mLs (1.5 mg total) into the skin once a week. 4 pen 11  . ergocalciferol (VITAMIN D2) 1.25 MG (50000 UT) capsule Take 50,000 Units by mouth once a week.    . fluconazole (  DIFLUCAN) 150 MG tablet TAKE ONE TABLET ONCE DAILY , AS NEEDED, FOR VAGINAL ITCH ASSOCIATED WITH ANTIBIOTIC USE (Patient taking differently: Take 150 mg by mouth See admin instructions. TAKE ONE TABLET ONCE DAILY , AS NEEDED, FOR VAGINAL ITCH ASSOCIATED WITH ANTIBIOTIC USE) 2 tablet 0  . glipiZIDE (GLUCOTROL XL) 5 MG 24 hr tablet Take 1 tablet (5 mg total) by mouth daily with breakfast. 90 tablet 3  . glucose blood test strip See admin instructions.    . insulin degludec (TRESIBA FLEXTOUCH) 200 UNIT/ML FlexTouch Pen Inject 50 Units into the skin daily. (Patient taking differently: Inject 50 Units into the skin at bedtime.) 18 mL 3  . Insulin Pen Needle (PEN NEEDLES) 31G X 6 MM MISC See admin instructions.    Marland Kitchen LORazepam (ATIVAN) 1 MG tablet Take 1 mg by mouth daily as needed (Nausea).    . Metoprolol Tartrate 37.5 MG TABS TAKE 1 TABLET BY MOUTH TWICE DAILY (Patient taking differently: Take 37.5 mg by mouth 2 (two) times daily.) 180 tablet 3  . naproxen (NAPROSYN) 500 MG tablet Take 500 mg by mouth daily as needed for headache.    Marland Kitchen omeprazole (PRILOSEC) 40 MG capsule Take 1 capsule (40 mg total) by mouth daily. 30 capsule 5  . ONETOUCH VERIO test strip USE AS DIRECTED FOUR TIMES DAILY 150 strip 5  . oxyCODONE (OXYCONTIN) 10 mg 12 hr tablet Take 1 tablet (10 mg total) by mouth every 12 (twelve) hours. 14 tablet 0  . rosuvastatin (CRESTOR) 20 MG tablet Take 20 mg by mouth daily.    .  temazepam (RESTORIL) 15 MG capsule Take 1 capsule (15 mg total) by mouth at bedtime. 30 capsule 5  . traZODone (DESYREL) 100 MG tablet Take 100 mg by mouth at bedtime.    . triamterene-hydrochlorothiazide (MAXZIDE-25) 37.5-25 MG tablet Take 1 tablet by mouth daily.    Marland Kitchen venlafaxine XR (EFFEXOR-XR) 75 MG 24 hr capsule Take 75 mg by mouth daily with breakfast.     No facility-administered medications prior to visit.    Allergies  Allergen Reactions  . Penicillins Swelling    Has patient had a PCN reaction causing immediate rash, facial/tongue/throat swelling, SOB or lightheadedness with hypotension: Yes Has patient had a PCN reaction causing severe rash involving mucus membranes or skin necrosis: No Has patient had a PCN reaction that required hospitalization: No  Has patient had a PCN reaction occurring within the last 10 years: No If all of the above answers are "NO", then may proceed with Cephalosporin use.   Of face and tongue.  . Metformin And Related Diarrhea and Nausea Only    Pt to discontinue med due to intolerance, cramps and diarrhea  . Wound Dressing Adhesive Itching and Rash    Dermabond and other surgical glues for incisions  . Empagliflozin Other (See Comments)    Yeast infection  Jardiance  . Penicillin G Other (See Comments)  . Statins Other (See Comments)    Fell bad but on Crestor  . Zetia [Ezetimibe] Other (See Comments)    Feels funny    ROS Review of Systems  Constitutional: Negative.   Respiratory: Negative.   Cardiovascular: Negative.   Musculoskeletal: Positive for back pain.       Radiates down both legs      Objective:    Physical Exam Constitutional:      Appearance: She is obese.  Cardiovascular:     Rate and Rhythm: Normal rate and regular rhythm.  Pulses: Normal pulses.     Heart sounds: Normal heart sounds.  Pulmonary:     Effort: Pulmonary effort is normal.     Breath sounds: Normal breath sounds.  Abdominal:     Comments:  Abdominal incision is healing well, staples in place, no sign of infeciton  Neurological:     Mental Status: She is alert.     BP (!) 102/50   Pulse 88   Temp 99.4 F (37.4 C)   Resp 20   Ht 5' 5.5" (1.664 m)   Wt 212 lb (96.2 kg)   SpO2 92%   BMI 34.74 kg/m  Wt Readings from Last 3 Encounters:  12/11/20 212 lb (96.2 kg)  12/05/20 225 lb 1.4 oz (102.1 kg)  12/01/20 214 lb 4.8 oz (97.2 kg)     Health Maintenance Due  Topic Date Due  . COVID-19 Vaccine (1) Never done    There are no preventive care reminders to display for this patient.  Lab Results  Component Value Date   TSH 2.230 02/20/2020   Lab Results  Component Value Date   WBC 18.9 (H) 12/03/2020   HGB 13.1 12/03/2020   HCT 39.7 12/03/2020   MCV 99.5 12/03/2020   PLT 270 12/03/2020   Lab Results  Component Value Date   NA 140 12/03/2020   K 3.8 12/03/2020   CO2 25 12/01/2020   GLUCOSE 154 (H) 12/03/2020   BUN 19 12/03/2020   CREATININE 1.02 (H) 12/03/2020   BILITOT 0.3 10/07/2020   ALKPHOS 77 10/07/2020   AST 16 10/07/2020   ALT 18 10/07/2020   PROT 6.8 10/07/2020   ALBUMIN 4.3 10/07/2020   CALCIUM 9.5 12/01/2020   ANIONGAP 10 12/01/2020   Lab Results  Component Value Date   CHOL 131 10/07/2020   Lab Results  Component Value Date   HDL 43 10/07/2020   Lab Results  Component Value Date   LDLCALC 66 10/07/2020   Lab Results  Component Value Date   TRIG 124 10/07/2020   Lab Results  Component Value Date   CHOLHDL 3.0 10/07/2020   Lab Results  Component Value Date   HGBA1C 6.4 (H) 12/03/2020      Assessment & Plan:   Problem List Items Addressed This Visit      Musculoskeletal and Integument   Fusion of spine, lumbar region    -discharged 12/06/20, notes reviewed -will get post-op labs today      Relevant Orders   CBC with Differential/Platelet   Basic Metabolic Panel (BMET)     Other   Pain associated with surgical procedure    -is having pain post-surgery that is  radiating down both legs -she has called her neurosurgeon without receiving a response; encouraged her to call back today -Rx. Gabapentin -she has narcotic and NSAID         Meds ordered this encounter  Medications  . gabapentin (NEURONTIN) 100 MG capsule    Sig: Take 1 capsule (100 mg total) by mouth 3 (three) times daily.    Dispense:  90 capsule    Refill:  3    Follow-up: Return if symptoms worsen or fail to improve.    Noreene Larsson, NP

## 2020-12-11 NOTE — Assessment & Plan Note (Signed)
-  discharged 12/06/20, notes reviewed -will get post-op labs today

## 2020-12-12 ENCOUNTER — Encounter (HOSPITAL_COMMUNITY): Payer: Self-pay

## 2020-12-12 ENCOUNTER — Emergency Department (HOSPITAL_COMMUNITY): Payer: BC Managed Care – PPO

## 2020-12-12 ENCOUNTER — Emergency Department (HOSPITAL_COMMUNITY)
Admission: EM | Admit: 2020-12-12 | Discharge: 2020-12-12 | Disposition: A | Discharge status: Home / Self Care | Payer: BC Managed Care – PPO | Attending: Emergency Medicine | Admitting: Emergency Medicine

## 2020-12-12 DIAGNOSIS — M79604 Pain in right leg: Secondary | ICD-10-CM

## 2020-12-12 DIAGNOSIS — F17210 Nicotine dependence, cigarettes, uncomplicated: Secondary | ICD-10-CM | POA: Diagnosis not present

## 2020-12-12 DIAGNOSIS — Z8616 Personal history of COVID-19: Secondary | ICD-10-CM | POA: Diagnosis not present

## 2020-12-12 DIAGNOSIS — Z7984 Long term (current) use of oral hypoglycemic drugs: Secondary | ICD-10-CM | POA: Diagnosis not present

## 2020-12-12 DIAGNOSIS — M79605 Pain in left leg: Secondary | ICD-10-CM | POA: Insufficient documentation

## 2020-12-12 DIAGNOSIS — Z794 Long term (current) use of insulin: Secondary | ICD-10-CM | POA: Diagnosis not present

## 2020-12-12 DIAGNOSIS — E1169 Type 2 diabetes mellitus with other specified complication: Secondary | ICD-10-CM | POA: Insufficient documentation

## 2020-12-12 DIAGNOSIS — Z7982 Long term (current) use of aspirin: Secondary | ICD-10-CM | POA: Diagnosis not present

## 2020-12-12 DIAGNOSIS — Z79899 Other long term (current) drug therapy: Secondary | ICD-10-CM | POA: Diagnosis not present

## 2020-12-12 DIAGNOSIS — Z86018 Personal history of other benign neoplasm: Secondary | ICD-10-CM | POA: Diagnosis not present

## 2020-12-12 DIAGNOSIS — E039 Hypothyroidism, unspecified: Secondary | ICD-10-CM | POA: Insufficient documentation

## 2020-12-12 DIAGNOSIS — Z923 Personal history of irradiation: Secondary | ICD-10-CM | POA: Diagnosis not present

## 2020-12-12 DIAGNOSIS — Z853 Personal history of malignant neoplasm of breast: Secondary | ICD-10-CM | POA: Insufficient documentation

## 2020-12-12 DIAGNOSIS — Z9221 Personal history of antineoplastic chemotherapy: Secondary | ICD-10-CM | POA: Insufficient documentation

## 2020-12-12 DIAGNOSIS — M79661 Pain in right lower leg: Secondary | ICD-10-CM | POA: Insufficient documentation

## 2020-12-12 DIAGNOSIS — E7800 Pure hypercholesterolemia, unspecified: Secondary | ICD-10-CM | POA: Diagnosis not present

## 2020-12-12 MED ORDER — OXYCODONE-ACETAMINOPHEN 5-325 MG PO TABS: 2.0000 | ORAL_TABLET | ORAL | 0 refills | Status: DC | PRN

## 2020-12-12 NOTE — ED Notes (Signed)
Reviewed discharge instructions with patient and support person. Follow-up care and medications reviewed. Patient and support person verbalized understanding. Patient A&Ox4, VSS, and ambulatory with steady gait upon discharge.  ?

## 2020-12-12 NOTE — ED Provider Notes (Signed)
[facility] EMERGENCY DEPARTMENT Provider Note   CSN: [id number] Arrival date & time: 12/12/20  0941     History Chief Complaint  Patient presents with  . Leg Pain    Annette [name] is a 60 y.o. female.  Radiating down her left leg anteriorly as well as pain to the right anterior shin region.  Symptoms been ongoing for the past 6 to 7 days since her surgery approximately 7 days ago.  She describes the pain as sharp and persistent for the last several days.  Patient states that she tried to call her neurology group but was unable to reach her neurosurgeon and decided come to the ER.  Patient has been taking oxycodone, however has stopped it for the last couple of days because she states is not working.  She saw her primary care doctor who started her on gabapentin which she states is also not working.  Otherwise denies fevers or cough.  Denies vomiting or diarrhea.  Denies any bowel or bladder dysfunction or any new numbness or weakness.        Past Medical History:  Diagnosis Date  . Arthritis    Phreesia 10/14/2020  . Blood transfusion without reported diagnosis    Phreesia 10/14/2020  . Breast cancer ([place]) 2007   History of  XRT, onTamoxifen  . Bruises easily   . Cancer ([place])    Phreesia 10/14/2020  . Depression   . Depression    Phreesia 10/14/2020  . Diabetes mellitus    prediabetic  . Diabetes mellitus without complication ([place])    Phreesia 10/14/2020  . GERD (gastroesophageal reflux disease)   . Hypertriglyceridemia   . Hypothyroidism    following chemo amnd radiaition for breast cancer, needed replacement short term  . Hypothyroidism (acquired)    replaced x 1 year  . IBS (irritable bowel syndrome)   . Menieres disease    Controlled with triamterene   . Migraines   . Obesity   . Palpitations   . 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[place])   . Sleep apnea 2010   Problems with CPAP    Patient Active Problem List   Diagnosis Date Noted  . Fusion  of spine, lumbar region 12/11/2020  . Pain associated with surgical procedure 12/11/2020  . Disc degeneration, lumbar 12/03/2020  . Pure hypercholesterolemia 11/07/2020  . Type 2 diabetes mellitus with hyperglycemia ([place]) 11/07/2020  . Viral URI with cough 10/14/2020  . Skin lesion of chest wall 10/07/2020  . Adrenal mass ([place]) 05/30/2020  . COVID-19 virus infection 11/01/2019  . Lab test positive for detection of COVID-19 virus 11/01/2019  . Neck pain on left side 08/28/2019  . Extensor tenosynovitis of wrist, left 08/28/2019  . Morbid obesity ([place]) 03/25/2019  . Back pain 03/25/2019  . Light headedness 01/08/2019  . At risk for cardiovascular event 01/30/2018  . Palpitations 03/27/2015  . Hyperlipidemia LDL goal <100 07/15/2014  . GERD (gastroesophageal reflux disease) 10/09/2013  . IBS (irritable bowel syndrome) 10/09/2013  . GAD (generalized anxiety disorder) 05/14/2013  . Metabolic syndrome X 06/26/3006  . Essential hypertension 01/09/2013  . Nicotine dependence 01/09/2013  . Insomnia 08/14/2012  . Type 2 diabetes mellitus with other specified complication ([place]) 62/26/3335  . Pneumonia 02/21/2012  . Respiratory insufficiency following shock, trauma, or surgery 02/21/2012  . Acute blood loss anemia 02/16/2012  . History of partial thyroidectomy 12/31/2011  . Adrenal adenoma 08/06/2011  . Meniere's disease 04/22/2011  . Sleep apnea 04/22/2011  . ABNORMAL THYROID FUNCTION  TESTS 08/17/2010  . Vitamin D deficiency 01/15/2010  . IBS 01/01/2010  . BREAST CANCER, HX OF 01/01/2010    Past Surgical History:  Procedure Laterality Date  . ABDOMINAL EXPOSURE N/A 12/03/2020   Procedure: ABDOMINAL EXPOSURE;  Surgeon: Rosetta Posner, MD;  Location: [facility] OR;  Service: Vascular;  Laterality: N/A;  . ABDOMINAL HYSTERECTOMY  1998   Benign, fibroids  . 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 ANTERIOR LUMBAR FUSION N/A 12/03/2020   Procedure: Lumbar  Four-Five Lumbar Five Sacral One Anterior lumbar interbody fusion;  Surgeon: Ashok Pall, MD;  Location: [facility];  Service: Neurosurgery;  Laterality: N/A;  . APPENDECTOMY  06/12/08  . BIOPSY  07/02/2020   Procedure: BIOPSY;  Surgeon: Harvel Quale, MD;  [facility];  Service: Gastroenterology;;  . BREAST LUMPECTOMY Left 2008  . BREAST RECONSTRUCTION     Left reconstructive  . BREAST SURGERY  2007   Left lumpectomy  . BREAST SURGERY     Mammosite - right side  . CHOLECYSTECTOMY  1985  . COLONOSCOPY WITH PROPOFOL N/A 07/02/2020   Procedure: COLONOSCOPY WITH PROPOFOL;  Surgeon: Harvel Quale, MD;  [facility];  Service: Gastroenterology;  Laterality: N/A;  1045  . ESOPHAGEAL DILATION  12/19/2015   Procedure: ESOPHAGEAL DILATION;  Surgeon: Rogene Houston, MD;  [facility];  Service: Endoscopy;;  . ESOPHAGOGASTRODUODENOSCOPY N/A 12/19/2015   Procedure: ESOPHAGOGASTRODUODENOSCOPY (EGD);  Surgeon: Rogene Houston, MD;  [facility];  Service: Endoscopy;  Laterality: N/A;  11:40  . POLYPECTOMY  07/02/2020   Procedure: POLYPECTOMY;  Surgeon: Montez Morita, Quillian Quince, MD;  [facility];  Service: Gastroenterology;;  . SPLENECTOMY, TOTAL  4/31/5400   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  . THYROIDECTOMY, PARTIAL  11/05/2010   Benign disease     OB History   No obstetric history on file.     Family History  Problem Relation Age of Onset  . Diabetes Mother   . Arrhythmia Mother   . Heart disease Father   . Hyperlipidemia Father   . Arrhythmia Father   . Cancer Paternal Grandmother        Breast  . Diabetes Sister   . Diabetes Brother   . Heart disease Brother     Social History   Tobacco Use  . Smoking status: Current Every Day Smoker    Packs/day: 0.50    Years: 40.00    Pack years: 20.00    Types: Cigarettes  . Smokeless tobacco: Never Used  . Tobacco comment: 1/2 pack a day   Vaping Use  . Vaping Use: Never used  Substance Use Topics  . Alcohol use: No    Alcohol/week: 0.0 standard drinks    Comment: Encouraged to quit smoking. She has tried the nicotone gum and patches but did not help  . Drug use: No    Home Medications Prior to Admission medications   Medication Sig Start Date End Date Taking? Authorizing Provider  aspirin 81 MG EC tablet Take 81 mg by mouth daily.    [provider]  CLOTRIMAZOLE EX Apply 1 application topically daily as needed (itching an breast). 15 %    [provider]  cyclobenzaprine (FLEXERIL) 10 MG tablet Take 10 mg by mouth daily as needed for muscle spasms.    [provider]  cyclobenzaprine (FLEXERIL) 10 MG tablet Take 1 tablet (10 mg total) by mouth 3 (  three) times daily as needed for muscle spasms. 12/06/20   Earnie Larsson, MD  Dulaglutide (TRULICITY) 1.5 OF/7.5ZW SOPN Inject 0.5 mLs (1.5 mg total) into the skin once a week. 04/17/20   Philemon Kingdom, MD  ergocalciferol (VITAMIN D2) 1.25 MG (50000 UT) capsule Take 50,000 Units by mouth once a week.    [provider]  fluconazole (DIFLUCAN) 150 MG tablet TAKE ONE TABLET ONCE DAILY , AS NEEDED, FOR VAGINAL ITCH ASSOCIATED WITH ANTIBIOTIC USE Patient taking differently: Take 150 mg by mouth See admin instructions. TAKE ONE TABLET ONCE DAILY , AS NEEDED, FOR VAGINAL ITCH ASSOCIATED WITH ANTIBIOTIC USE 10/07/20   Fayrene Helper, MD  gabapentin (NEURONTIN) 100 MG capsule Take 1 capsule (100 mg total) by mouth 3 (three) times daily. 12/11/20   Noreene Larsson, NP  glipiZIDE (GLUCOTROL XL) 5 MG 24 hr tablet Take 1 tablet (5 mg total) by mouth daily with breakfast. 06/24/20   Philemon Kingdom, MD  glucose blood test strip See admin instructions. 01/31/20   [provider]  insulin degludec (TRESIBA FLEXTOUCH) 200 UNIT/ML FlexTouch Pen Inject 50 Units into the skin daily. Patient taking differently: Inject 50 Units into the skin at bedtime.  09/29/20   Philemon Kingdom, MD  Insulin Pen Needle (PEN NEEDLES) 31G X 6 MM MISC See admin instructions. 01/31/20   [provider]  LORazepam (ATIVAN) 1 MG tablet Take 1 mg by mouth daily as needed (Nausea).    [provider]  Metoprolol Tartrate 37.5 MG TABS TAKE 1 TABLET BY MOUTH TWICE DAILY Patient taking differently: Take 37.5 mg by mouth 2 (two) times daily. 07/25/20   Fayrene Helper, MD  naproxen (NAPROSYN) 500 MG tablet Take 500 mg by mouth daily as needed for headache.    [provider]  omeprazole (PRILOSEC) 40 MG capsule Take 1 capsule (40 mg total) by mouth daily. 10/11/20   Fayrene Helper, MD  [facility] VERIO test strip USE AS DIRECTED FOUR TIMES DAILY 08/22/20   Fayrene Helper, MD  oxyCODONE (OXYCONTIN) 10 mg 12 hr tablet Take 1 tablet (10 mg total) by mouth every 12 (twelve) hours. 12/06/20   Earnie Larsson, MD  rosuvastatin (CRESTOR) 20 MG tablet Take 20 mg by mouth daily.    [provider]  temazepam (RESTORIL) 15 MG capsule Take 1 capsule (15 mg total) by mouth at bedtime. 11/03/20   Fayrene Helper, MD  traZODone (DESYREL) 100 MG tablet Take 100 mg by mouth at bedtime.    [provider]  triamterene-hydrochlorothiazide (MAXZIDE-25) 37.5-25 MG tablet Take 1 tablet by mouth daily.    [provider]  venlafaxine XR (EFFEXOR-XR) 75 MG 24 hr capsule Take 75 mg by mouth daily with breakfast.    [provider]    Allergies    Penicillins, Metformin and related, Wound dressing adhesive, Empagliflozin, Penicillin g, Statins, and Zetia [ezetimibe]  Review of Systems   Review of Systems  Constitutional: Negative for fever.  HENT: Negative for ear pain.   Eyes: Negative for pain.  Respiratory: Negative for cough.   Cardiovascular: Negative for chest pain.  Gastrointestinal: Negative for abdominal pain.  Genitourinary: Negative for flank pain.  Musculoskeletal: Negative for back pain.  Skin: Negative  for rash.  Neurological: Negative for headaches.    Physical Exam Updated Vital Signs BP (!) 118/55 (BP Location: Right Arm)   Pulse 96   Temp 98.2 F (36.8 C)   Resp 16   SpO2 100%  Physical Exam Constitutional:      General: She is not in acute distress.    Appearance: Normal appearance.  HENT:     Head: Normocephalic.     Nose: Nose normal.  Eyes:     Extraocular Movements: Extraocular movements intact.  Cardiovascular:     Rate and Rhythm: Normal rate.  Pulmonary:     Effort: Pulmonary effort is normal.  Musculoskeletal:        General: Normal range of motion.     Cervical back: Normal range of motion.     Comments: Normal range of motion of bilateral hips and knees and ankles.  No pain with with palpation of the left thigh or left lower extremity.  Moderate discomfort with palpation of the right anterior shin, no color change or bruise noted.  Compartment soft.  No posterior leg swelling or edema noted.  Neurological:     General: No focal deficit present.     Mental Status: She is alert. Mental status is at baseline.     ED Results / Procedures / Treatments   Labs (all labs ordered are listed, but only abnormal results are displayed) Labs Reviewed - No data to display  EKG None  Radiology DG Lumbar Spine 2-3 Views  Result Date: 12/12/2020 CLINICAL DATA:  Low back pain with radicular symptoms EXAM: LUMBAR SPINE - 2-3 VIEW COMPARISON:  Intraoperative lumbar images December 03, 2020; lumbar CT September 09, 2020 FINDINGS: Frontal, lateral, and spot lumbosacral lateral images were obtained. There are 5 non-rib-bearing lumbar type vertebral bodies. Anterior interbody fusion at L4 and L5 as well as at L5 and S1. No fracture or spondylolisthesis. The disc spaces appear unremarkable. Skin staples noted over left pelvis. IMPRESSION: Anterior interbody fusion at L4 and L5 and at L5 and S1 with support hardware intact. No fracture or spondylolisthesis. Disc spaces appear  unremarkable. Electronically Signed   By: Lowella Grip III M.D.   On: 12/12/2020 11:33    Procedures Procedures   Medications Ordered in ED Medications - No data to display  ED Course  I have reviewed the triage vital signs and the nursing notes.  Pertinent labs & imaging results that were available during my care of the patient were reviewed by me and considered in my medical decision making (see chart for details).    MDM Rules/Calculators/A&P                          DVT less likely given the tenderness is on the anterior shin region and left anterior thigh region.  No C or T or L-spine midline step-offs or tenderness noted.  Case discussed with patient surgeon Norval Gable x-ray imaging and then continued outpatient follow-up with the clinic.  Patient offered intramuscular pain medication, but declined.  I will recommend outpatient follow-up with her surgeon within the week.  Advising return for worsening symptoms new numbness weakness or any additional concerns.  Final Clinical Impression(s) / ED Diagnoses Final diagnoses:  Bilateral leg pain    Rx / DC Orders ED Discharge Orders    None       Luna Fuse, MD 12/12/20 1146

## 2020-12-12 NOTE — ED Triage Notes (Signed)
Pt reports pain to both her legs since having back surgery on Wednesday, denies any significant back pain, pt able to ambulate but with pain. Pain is worse in her right leg

## 2020-12-12 NOTE — Discharge Instructions (Addendum)
Call your primary care doctor or specialist as discussed in the next 2-3 days.   Return immediately back to the ER if:  Your symptoms worsen within the next 12-24 hours. You develop new symptoms such as new fevers, persistent vomiting, new pain, shortness of breath, or new weakness or numbness, or if you have any other concerns.  

## 2020-12-12 NOTE — Progress Notes (Signed)
Her WBCs are lower than when she was in the hospital, so things are going in the right direction. For her pain, she should follow-up with her neurosurgeon.

## 2020-12-13 LAB — CBC WITH DIFFERENTIAL/PLATELET
Basophils Absolute: 0.1 10*3/uL (ref 0.0–0.2)
Basos: 1 %
EOS (ABSOLUTE): 1 10*3/uL — ABNORMAL HIGH (ref 0.0–0.4)
Eos: 6 %
Hematocrit: 42.4 % (ref 34.0–46.6)
Hemoglobin: 14.2 g/dL (ref 11.1–15.9)
Immature Grans (Abs): 0.1 10*3/uL (ref 0.0–0.1)
Immature Granulocytes: 1 %
Lymphocytes Absolute: 3.8 10*3/uL — ABNORMAL HIGH (ref 0.7–3.1)
Lymphs: 25 %
MCH: 32.6 pg (ref 26.6–33.0)
MCHC: 33.5 g/dL (ref 31.5–35.7)
MCV: 98 fL — ABNORMAL HIGH (ref 79–97)
Monocytes Absolute: 1.4 10*3/uL — ABNORMAL HIGH (ref 0.1–0.9)
Monocytes: 9 %
Neutrophils Absolute: 8.9 10*3/uL — ABNORMAL HIGH (ref 1.4–7.0)
Neutrophils: 58 %
Platelets: 498 10*3/uL — ABNORMAL HIGH (ref 150–450)
RBC: 4.35 x10E6/uL (ref 3.77–5.28)
RDW: 12.5 % (ref 11.7–15.4)
WBC: 15.3 10*3/uL — ABNORMAL HIGH (ref 3.4–10.8)

## 2020-12-13 LAB — BASIC METABOLIC PANEL
BUN/Creatinine Ratio: 16 (ref 12–28)
BUN: 16 mg/dL (ref 8–27)
CO2: 21 mmol/L (ref 20–29)
Calcium: 9.8 mg/dL (ref 8.7–10.3)
Chloride: 97 mmol/L (ref 96–106)
Creatinine, Ser: 1.01 mg/dL — ABNORMAL HIGH (ref 0.57–1.00)
Glucose: 114 mg/dL — ABNORMAL HIGH (ref 65–99)
Potassium: 4.4 mmol/L (ref 3.5–5.2)
Sodium: 135 mmol/L (ref 134–144)
eGFR: 63 mL/min/{1.73_m2} (ref >59)

## 2020-12-16 ENCOUNTER — Emergency Department (HOSPITAL_COMMUNITY)
Admission: EM | Admit: 2020-12-16 | Discharge: 2020-12-16 | Disposition: A | Discharge status: Home / Self Care | Payer: BC Managed Care – PPO | Attending: Emergency Medicine | Admitting: Emergency Medicine

## 2020-12-16 ENCOUNTER — Other Ambulatory Visit: Payer: Self-pay

## 2020-12-16 DIAGNOSIS — E119 Type 2 diabetes mellitus without complications: Secondary | ICD-10-CM | POA: Diagnosis not present

## 2020-12-16 DIAGNOSIS — Z7984 Long term (current) use of oral hypoglycemic drugs: Secondary | ICD-10-CM | POA: Insufficient documentation

## 2020-12-16 DIAGNOSIS — Z79899 Other long term (current) drug therapy: Secondary | ICD-10-CM | POA: Diagnosis not present

## 2020-12-16 DIAGNOSIS — I10 Essential (primary) hypertension: Secondary | ICD-10-CM | POA: Insufficient documentation

## 2020-12-16 DIAGNOSIS — Z8616 Personal history of COVID-19: Secondary | ICD-10-CM | POA: Diagnosis not present

## 2020-12-16 DIAGNOSIS — M5410 Radiculopathy, site unspecified: Secondary | ICD-10-CM | POA: Diagnosis not present

## 2020-12-16 DIAGNOSIS — M79651 Pain in right thigh: Secondary | ICD-10-CM | POA: Diagnosis present

## 2020-12-16 DIAGNOSIS — Z853 Personal history of malignant neoplasm of breast: Secondary | ICD-10-CM | POA: Insufficient documentation

## 2020-12-16 DIAGNOSIS — E039 Hypothyroidism, unspecified: Secondary | ICD-10-CM | POA: Diagnosis not present

## 2020-12-16 DIAGNOSIS — M545 Low back pain, unspecified: Secondary | ICD-10-CM | POA: Insufficient documentation

## 2020-12-16 DIAGNOSIS — F17210 Nicotine dependence, cigarettes, uncomplicated: Secondary | ICD-10-CM | POA: Diagnosis not present

## 2020-12-16 DIAGNOSIS — Z7982 Long term (current) use of aspirin: Secondary | ICD-10-CM | POA: Insufficient documentation

## 2020-12-16 DIAGNOSIS — Z794 Long term (current) use of insulin: Secondary | ICD-10-CM | POA: Diagnosis not present

## 2020-12-16 MED ORDER — OXYCODONE-ACETAMINOPHEN 7.5-325 MG PO TABS: 1.0000 | ORAL_TABLET | ORAL | 0 refills | Status: DC | PRN

## 2020-12-16 MED ORDER — OXYCODONE-ACETAMINOPHEN 5-325 MG PO TABS
2.0000 | ORAL_TABLET | Freq: Once | ORAL | Status: AC
  Administered 2020-12-16: 2 via ORAL
  Filled 2020-12-16: qty 2

## 2020-12-16 MED ORDER — GABAPENTIN 100 MG PO CAPS: 200.0000 mg | ORAL_CAPSULE | Freq: Three times a day (TID) | ORAL | 3 refills | Status: DC

## 2020-12-16 NOTE — Discharge Instructions (Addendum)
Increase your gabapentin to 200 mg (two tablets) three times a day.  After three days, if your leg is not doing better, and if you are not having any side effects, then increase the dose to 300 mg (three tablets) three times a day.

## 2020-12-16 NOTE — Op Note (Signed)
12/03/2020  2:37 PM  PATIENT:  Annette [name]  60 y.o. female  PRE-OPERATIVE DIAGNOSIS:  lumbosacral degenerative disc disease  POST-OPERATIVE DIAGNOSIS:  lumbosacral degenerative disc disease  PROCEDURE:  Procedure(s): Lumbar Four-Five Lumbar Five Sacral One Anterior lumbar interbody fusion ABDOMINAL EXPOSURE  SURGEON: Surgeon(s): Ashok Pall, MD Early, Arvilla Meres, MD Cherre Robins, MD  ASSISTANTS:none  ANESTHESIA:   local and general  EBL:  No intake/output data recorded.  BLOOD ADMINISTERED:none  CELL SAVER GIVEN:none  COUNT:per nursing, radiology  DRAINS: none   SPECIMEN:  No Specimen  DICTATION: Annette [name] was taken to the operating room, intubated, and placed under a general anesthetic without difficulty. She was positioned supine. Her abdomen was prepped and draped in a sterile manner. Under separate cover the approach to the lumbar spine will be dictated by Dr. Donnetta Hutching.  Once the L4-S1 region of the spine were exposed I started the discetomy of L4/5.  The disc was positively identified with fluoroscopy. I incised the annulus, and performed a discetomy with a variety of instruments. This included curettes, rasps, rongeurs, punches, and dilators. I measured the space once satisfied with the discetomy and placed a synthes synfix device in the disc space. The device was filled with allograft morsels and Bmp. 4 screws were placed and secured.  I then placed the synfix at L5/S1 in the same manner as described above. I closed the wound after removing the retractors and ensuring there were no obvious or apparent injuries to the vasculature.  I closed the wound in layers approximating rectus abdominus fascia, subcutaneous plane, and the subcuticular plane. I applied a sterile dressing. Annette [name] was extubated and moving all extremities.  PLAN OF CARE: Admit to inpatient   PATIENT DISPOSITION:  PACU - hemodynamically stable.   Delay start of Pharmacological VTE  agent (>24hrs) due to surgical blood loss or risk of bleeding:  yes

## 2020-12-16 NOTE — ED Notes (Signed)
Dr. Roxanne Mins made aware of patient's BP (116/51) and O2 level (92%). He stated that he still wanted pt to receive pain meds. Will administer per Provider request and closely monitor patient.

## 2020-12-16 NOTE — ED Triage Notes (Signed)
Back surgery on 3/2.  Pt c/o lower back pain and bilateral LE pain that started after surgery. Pt states that pain shoots down both legs and if you touch her lower extremities, "if feels like you just punched me."   Pt ambulated from wheelchair to ED stretcher without difficulty.  Pt went to PCP on Thurs and was given 100 mg Gabapentin.  Pt states that she has attempted to contact surgeon multiple times, with no success.

## 2020-12-16 NOTE — ED Provider Notes (Signed)
[facility] Provider Note   CSN: [id number] Arrival date & time: 12/16/20  0203   History Chief Complaint  Patient presents with  . Post-op Problem  . Back Pain  . Leg Pain    Annette [name] is a 60 y.o. female.  The history is provided by the patient.  Back Pain Associated symptoms: leg pain   Leg Pain Associated symptoms: back pain   She has history of diabetes, hyperlipidemia and is status post lumbar spinal fusion surgery on 3/2 and comes in with ongoing pain in bilateral thighs and calves.  The pain goes across the anterior thigh and anterolateral and lateral calves.  It is worse on the right.  She rates pain at 9/10.  Pain has been present since surgery and has not abated.  She did see her primary care provider who gave her a prescription for gabapentin which has not helped.  She was seen in the emergency department and given prescription for 6 oxycodone-acetaminophen tablets and she states it was gave her relief for a couple of hours.  She denies any bowel or bladder dysfunction.  She has been ambulatory.  She has not noticed any weakness.  Past Medical History:  Diagnosis Date  . Arthritis    Phreesia 10/14/2020  . Blood transfusion without reported diagnosis    Phreesia 10/14/2020  . Breast cancer ([place]) 2007   History of  XRT, onTamoxifen  . Bruises easily   . Cancer ([place])    Phreesia 10/14/2020  . Depression   . Depression    Phreesia 10/14/2020  . Diabetes mellitus    prediabetic  . Diabetes mellitus without complication ([place])    Phreesia 10/14/2020  . GERD (gastroesophageal reflux disease)   . Hypertriglyceridemia   . Hypothyroidism    following chemo amnd radiaition for breast cancer, needed replacement short term  . Hypothyroidism (acquired)    replaced x 1 year  . IBS (irritable bowel syndrome)   . Menieres disease    Controlled with triamterene   . Migraines   . Obesity   . Palpitations   . 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[place])    . Sleep apnea 2010   Problems with CPAP    Patient Active Problem List   Diagnosis Date Noted  . Fusion of spine, lumbar region 12/11/2020  . Pain associated with surgical procedure 12/11/2020  . Disc degeneration, lumbar 12/03/2020  . Pure hypercholesterolemia 11/07/2020  . Type 2 diabetes mellitus with hyperglycemia ([place]) 11/07/2020  . Viral URI with cough 10/14/2020  . Skin lesion of chest wall 10/07/2020  . Adrenal mass ([place]) 05/30/2020  . COVID-19 virus infection 11/01/2019  . Lab test positive for detection of COVID-19 virus 11/01/2019  . Neck pain on left side 08/28/2019  . Extensor tenosynovitis of wrist, left 08/28/2019  . Morbid obesity ([place]) 03/25/2019  . Back pain 03/25/2019  . Light headedness 01/08/2019  . At risk for cardiovascular event 01/30/2018  . Palpitations 03/27/2015  . Hyperlipidemia LDL goal <100 07/15/2014  . GERD (gastroesophageal reflux disease) 10/09/2013  . IBS (irritable bowel syndrome) 10/09/2013  . GAD (generalized anxiety disorder) 05/14/2013  . Metabolic syndrome X 62/94/7654  . Essential hypertension 01/09/2013  . Nicotine dependence 01/09/2013  . Insomnia 08/14/2012  . Type 2 diabetes mellitus with other specified complication ([place]) 65/12/5463  . Pneumonia 02/21/2012  . Respiratory insufficiency following shock, trauma, or surgery 02/21/2012  . Acute blood loss anemia 02/16/2012  . History of partial thyroidectomy 12/31/2011  . Adrenal adenoma  08/06/2011  . Meniere's disease 04/22/2011  . Sleep apnea 04/22/2011  . ABNORMAL THYROID FUNCTION TESTS 08/17/2010  . Vitamin D deficiency 01/15/2010  . IBS 01/01/2010  . BREAST CANCER, HX OF 01/01/2010    Past Surgical History:  Procedure Laterality Date  . ABDOMINAL EXPOSURE N/A 12/03/2020   Procedure: ABDOMINAL EXPOSURE;  Surgeon: Rosetta Posner, MD;  Location: [facility] OR;  Service: Vascular;  Laterality: N/A;  . ABDOMINAL HYSTERECTOMY  1998   Benign, fibroids  . ADRENALECTOMY  02/16/2012    [facility], splemic trauma, 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  . ANTERIOR LUMBAR FUSION N/A 12/03/2020   Procedure: Lumbar Four-Five Lumbar Five Sacral One Anterior lumbar interbody fusion;  Surgeon: Ashok Pall, MD;  Location: [facility];  Service: Neurosurgery;  Laterality: N/A;  . APPENDECTOMY  06/12/08  . BIOPSY  07/02/2020   Procedure: BIOPSY;  Surgeon: Harvel Quale, MD;  Location: [facility];  Service: Gastroenterology;;  . BREAST LUMPECTOMY Left 2008  . BREAST RECONSTRUCTION     Left reconstructive  . BREAST SURGERY  2007   Left lumpectomy  . BREAST SURGERY     Mammosite - right side  . CHOLECYSTECTOMY  1985  . COLONOSCOPY WITH PROPOFOL N/A 07/02/2020   Procedure: COLONOSCOPY WITH PROPOFOL;  Surgeon: Harvel Quale, MD;  Location: [facility];  Service: Gastroenterology;  Laterality: N/A;  1045  . ESOPHAGEAL DILATION  12/19/2015   Procedure: ESOPHAGEAL DILATION;  Surgeon: Rogene Houston, MD;  Location: [facility];  Service: Endoscopy;;  . ESOPHAGOGASTRODUODENOSCOPY N/A 12/19/2015   Procedure: ESOPHAGOGASTRODUODENOSCOPY (EGD);  Surgeon: Rogene Houston, MD;  [facility];  Service: Endoscopy;  Laterality: N/A;  11:40  . POLYPECTOMY  07/02/2020   Procedure: POLYPECTOMY;  Surgeon: Montez Morita, Quillian Quince, MD;  Location: [facility];  Service: Gastroenterology;;  . SPLENECTOMY, TOTAL  2/29/7989   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  . THYROIDECTOMY, PARTIAL  11/05/2010   Benign disease     OB History   No obstetric history on file.     Family History  Problem Relation Age of Onset  . Diabetes Mother   . Arrhythmia Mother   . Heart disease Father   . Hyperlipidemia Father   . Arrhythmia Father   . Cancer Paternal Grandmother        Breast  . Diabetes Sister   . Diabetes Brother   . Heart disease Brother     Social History   Tobacco Use  . Smoking status: Current Every Day Smoker    Packs/day: 0.50    Years: 40.00    Pack  years: 20.00    Types: Cigarettes  . Smokeless tobacco: Never Used  . Tobacco comment: 1/2 pack a day  Vaping Use  . Vaping Use: Never used  Substance Use Topics  . Alcohol use: No    Alcohol/week: 0.0 standard drinks    Comment: Encouraged to quit smoking. She has tried the nicotone gum and patches but did not help  . Drug use: No    Home Medications Prior to Admission medications   Medication Sig Start Date End Date Taking? Authorizing Provider  aspirin 81 MG EC tablet Take 81 mg by mouth daily.    [provider]  CLOTRIMAZOLE EX Apply 1 application topically daily as needed (itching an breast). 15 %    [provider]  cyclobenzaprine (FLEXERIL) 10 MG tablet Take 10 mg by mouth daily as needed for muscle spasms.    [provider]  cyclobenzaprine (FLEXERIL) 10 MG tablet Take 1 tablet (10 mg total) by mouth 3 (three) times daily as needed for muscle spasms. 12/06/20   Earnie Larsson, MD  Dulaglutide (TRULICITY) 1.5 YB/0.1BP SOPN Inject 0.5 mLs (1.5 mg total) into the skin once a week. 04/17/20   Philemon Kingdom, MD  ergocalciferol (VITAMIN D2) 1.25 MG (50000 UT) capsule Take 50,000 Units by mouth once a week.    [provider]  fluconazole (DIFLUCAN) 150 MG tablet TAKE ONE TABLET ONCE DAILY , AS NEEDED, FOR VAGINAL ITCH ASSOCIATED WITH ANTIBIOTIC USE Patient taking differently: Take 150 mg by mouth See admin instructions. TAKE ONE TABLET ONCE DAILY , AS NEEDED, FOR VAGINAL ITCH ASSOCIATED WITH ANTIBIOTIC USE 10/07/20   Fayrene Helper, MD  gabapentin (NEURONTIN) 100 MG capsule Take 1 capsule (100 mg total) by mouth 3 (three) times daily. 12/11/20   Noreene Larsson, NP  glipiZIDE (GLUCOTROL XL) 5 MG 24 hr tablet Take 1 tablet (5 mg total) by mouth daily with breakfast. 06/24/20   Philemon Kingdom, MD  glucose blood test strip See admin instructions. 01/31/20   [provider]  insulin degludec (TRESIBA FLEXTOUCH) 200 UNIT/ML FlexTouch Pen Inject  50 Units into the skin daily. Patient taking differently: Inject 50 Units into the skin at bedtime. 09/29/20   Philemon Kingdom, MD  Insulin Pen Needle (PEN NEEDLES) 31G X 6 MM MISC See admin instructions. 01/31/20   [provider]  LORazepam (ATIVAN) 1 MG tablet Take 1 mg by mouth daily as needed (Nausea).    [provider]  Metoprolol Tartrate 37.5 MG TABS TAKE 1 TABLET BY MOUTH TWICE DAILY Patient taking differently: Take 37.5 mg by mouth 2 (two) times daily. 07/25/20   Fayrene Helper, MD  naproxen (NAPROSYN) 500 MG tablet Take 500 mg by mouth daily as needed for headache.    [provider]  omeprazole (PRILOSEC) 40 MG capsule Take 1 capsule (40 mg total) by mouth daily. 10/11/20   Fayrene Helper, MD  [facility] VERIO test strip USE AS DIRECTED FOUR TIMES DAILY 08/22/20   Fayrene Helper, MD  oxyCODONE (OXYCONTIN) 10 mg 12 hr tablet Take 1 tablet (10 mg total) by mouth every 12 (twelve) hours. 12/06/20   Earnie Larsson, MD  oxyCODONE-acetaminophen (PERCOCET/ROXICET) 5-325 MG tablet Take 2 tablets by mouth every 4 (four) hours as needed for severe pain. 12/12/20   Luna Fuse, MD  rosuvastatin (CRESTOR) 20 MG tablet Take 20 mg by mouth daily.    [provider]  temazepam (RESTORIL) 15 MG capsule Take 1 capsule (15 mg total) by mouth at bedtime. 11/03/20   Fayrene Helper, MD  traZODone (DESYREL) 100 MG tablet Take 100 mg by mouth at bedtime.    [provider]  triamterene-hydrochlorothiazide (MAXZIDE-25) 37.5-25 MG tablet Take 1 tablet by mouth daily.    [provider]  venlafaxine XR (EFFEXOR-XR) 75 MG 24 hr capsule Take 75 mg by mouth daily with breakfast.    [provider]    Allergies    Penicillins, Metformin and related, Wound dressing adhesive, Empagliflozin, Penicillin g, Statins, and Zetia [ezetimibe]  Review of Systems   Review of Systems  Musculoskeletal: Positive for back pain.  All other systems  reviewed and are negative.   Physical Exam Updated Vital Signs BP 121/60 (BP Location: Right Arm)   Pulse 86   Temp 98.6 F (37 C) (Oral)   Resp 17   Ht 5\' 5"  (1.651 m)  Wt 96.2 kg   SpO2 94%   BMI 35.29 kg/m   Physical Exam Vitals and nursing note reviewed.   61 year old female, resting comfortably and in no acute distress. Vital signs are normal. Oxygen saturation is 94%, which is normal. Head is normocephalic and atraumatic. PERRLA, EOMI. Oropharynx is clear. Neck is nontender and supple without adenopathy or JVD. Back is nontender and there is no CVA tenderness.  Straight leg raise is positive bilaterally at 15 degrees. Lungs are clear without rales, wheezes, or rhonchi. Chest is nontender. Heart has regular rate and rhythm without murmur. Abdomen is soft, flat, nontender without masses or hepatosplenomegaly and peristalsis is normoactive.  Surgical scar has staples in place and shows no evidence of infection. Extremities have no cyanosis or edema, full range of motion is present. Skin is warm and dry without rash. Neurologic: Mental status is normal, cranial nerves are intact, there are no motor or sensory deficits.  She does have hyperesthesia in the right anterior medial calf.  There is normal strength of plantarflexion and dorsiflexion.  ED Results / Procedures / Treatments    Procedures Procedures   Medications Ordered in ED Medications  oxyCODONE-acetaminophen (PERCOCET/ROXICET) 5-325 MG per tablet 2 tablet (has no administration in time range)    ED Course  I have reviewed the triage vital signs and the nursing notes.  MDM Rules/Calculators/A&P Ongoing leg pain following lumbar fusion surgery.  Her symptoms seem to correspond to an L4 nerve root.  Old records are reviewed showing she had fusion of L4-L5 and L5-S1 using an anterior approach.  I suspect she would benefit from a higher dose of gabapentin, as she has been prescribed 100mg  3 times a day.  No  evidence of any neurologic dysfunction.  However, she may need a longer period of narcotic use until the gabapentin dose becomes effective.  No indication for emergent imaging today.  She had good relief of pain with higher dose of oxycodone-acetaminophen.  She is discharged with a prescription for oxycodone-acetaminophen 7.5-325.  She is also advised to increase her gabapentin to 200 mg 3 times daily and, if not getting adequate relief and not having side effects after after 3 days, increasing the dose to 300 mg 3 times daily.  She will need to follow-up with her neurosurgeon.  Final Clinical Impression(s) / ED Diagnoses Final diagnoses:  Radicular pain of lower extremity    Rx / DC Orders ED Discharge Orders         Ordered    oxyCODONE-acetaminophen (PERCOCET) 7.5-325 MG tablet  Every 4 hours PRN        12/16/20 0407    gabapentin (NEURONTIN) 100 MG capsule  3 times daily        12/16/20 [id number]           Delora Fuel, MD 81/27/51 [phone]

## 2020-12-19 ENCOUNTER — Emergency Department (HOSPITAL_COMMUNITY)
Admission: EM | Admit: 2020-12-19 | Discharge: 2020-12-20 | Disposition: A | Discharge status: Home / Self Care | Payer: BC Managed Care – PPO | Attending: Emergency Medicine | Admitting: Emergency Medicine

## 2020-12-19 ENCOUNTER — Encounter (HOSPITAL_COMMUNITY): Payer: Self-pay | Admitting: Emergency Medicine

## 2020-12-19 ENCOUNTER — Other Ambulatory Visit: Payer: Self-pay

## 2020-12-19 DIAGNOSIS — Z853 Personal history of malignant neoplasm of breast: Secondary | ICD-10-CM | POA: Insufficient documentation

## 2020-12-19 DIAGNOSIS — R000 Tachycardia, unspecified: Secondary | ICD-10-CM | POA: Insufficient documentation

## 2020-12-19 DIAGNOSIS — R55 Syncope and collapse: Secondary | ICD-10-CM | POA: Diagnosis present

## 2020-12-19 DIAGNOSIS — Z8616 Personal history of COVID-19: Secondary | ICD-10-CM | POA: Diagnosis not present

## 2020-12-19 DIAGNOSIS — Z7984 Long term (current) use of oral hypoglycemic drugs: Secondary | ICD-10-CM | POA: Diagnosis not present

## 2020-12-19 DIAGNOSIS — Z794 Long term (current) use of insulin: Secondary | ICD-10-CM | POA: Insufficient documentation

## 2020-12-19 DIAGNOSIS — E119 Type 2 diabetes mellitus without complications: Secondary | ICD-10-CM | POA: Insufficient documentation

## 2020-12-19 DIAGNOSIS — Z7982 Long term (current) use of aspirin: Secondary | ICD-10-CM | POA: Diagnosis not present

## 2020-12-19 DIAGNOSIS — E039 Hypothyroidism, unspecified: Secondary | ICD-10-CM | POA: Insufficient documentation

## 2020-12-19 DIAGNOSIS — F17210 Nicotine dependence, cigarettes, uncomplicated: Secondary | ICD-10-CM | POA: Diagnosis not present

## 2020-12-19 DIAGNOSIS — E860 Dehydration: Secondary | ICD-10-CM | POA: Diagnosis not present

## 2020-12-19 DIAGNOSIS — G8918 Other acute postprocedural pain: Secondary | ICD-10-CM

## 2020-12-19 DIAGNOSIS — Z79899 Other long term (current) drug therapy: Secondary | ICD-10-CM | POA: Diagnosis not present

## 2020-12-19 LAB — CBC WITH DIFFERENTIAL/PLATELET
Abs Immature Granulocytes: 0.04 10*3/uL (ref 0.00–0.07)
Basophils Absolute: 0.1 10*3/uL (ref 0.0–0.1)
Basophils Relative: 1 %
Eosinophils Absolute: 0.2 10*3/uL (ref 0.0–0.5)
Eosinophils Relative: 2 %
HCT: 44.6 % (ref 36.0–46.0)
Hemoglobin: 15.4 g/dL — ABNORMAL HIGH (ref 12.0–15.0)
Immature Granulocytes: 0 %
Lymphocytes Relative: 24 %
Lymphs Abs: 3.5 10*3/uL (ref 0.7–4.0)
MCH: 33.8 pg (ref 26.0–34.0)
MCHC: 34.5 g/dL (ref 30.0–36.0)
MCV: 98 fL (ref 80.0–100.0)
Monocytes Absolute: 0.9 10*3/uL (ref 0.1–1.0)
Monocytes Relative: 6 %
Neutro Abs: 9.6 10*3/uL — ABNORMAL HIGH (ref 1.7–7.7)
Neutrophils Relative %: 67 %
Platelets: 560 10*3/uL — ABNORMAL HIGH (ref 150–400)
RBC: 4.55 MIL/uL (ref 3.87–5.11)
RDW: 13.8 % (ref 11.5–15.5)
WBC: 14.5 10*3/uL — ABNORMAL HIGH (ref 4.0–10.5)
nRBC: 0 % (ref 0.0–0.2)

## 2020-12-19 LAB — COMPREHENSIVE METABOLIC PANEL
ALT: 15 U/L (ref 0–44)
AST: 18 U/L (ref 15–41)
Albumin: 3.5 g/dL (ref 3.5–5.0)
Alkaline Phosphatase: 116 U/L (ref 38–126)
Anion gap: 12 (ref 5–15)
BUN: 15 mg/dL (ref 8–23)
CO2: 23 mmol/L (ref 22–32)
Calcium: 9.3 mg/dL (ref 8.9–10.3)
Chloride: 101 mmol/L (ref 98–111)
Creatinine, Ser: 0.93 mg/dL (ref 0.44–1.00)
GFR, Estimated: >60 mL/min (ref >60)
Glucose, Bld: 124 mg/dL — ABNORMAL HIGH (ref 70–99)
Potassium: 3 mmol/L — ABNORMAL LOW (ref 3.5–5.1)
Sodium: 136 mmol/L (ref 135–145)
Total Bilirubin: 0.7 mg/dL (ref 0.3–1.2)
Total Protein: 7.4 g/dL (ref 6.5–8.1)

## 2020-12-19 LAB — URINALYSIS, ROUTINE W REFLEX MICROSCOPIC
Bilirubin Urine: NEGATIVE
Glucose, UA: NEGATIVE mg/dL
Ketones, ur: NEGATIVE mg/dL
Leukocytes,Ua: NEGATIVE
Nitrite: NEGATIVE
Protein, ur: NEGATIVE mg/dL
Specific Gravity, Urine: 1.006 (ref 1.005–1.030)
pH: 7 (ref 5.0–8.0)

## 2020-12-19 MED ORDER — OXYCODONE-ACETAMINOPHEN 5-325 MG PO TABS
2.0000 | ORAL_TABLET | Freq: Four times a day (QID) | ORAL | Status: DC | PRN
  Administered 2020-12-19: 2 via ORAL
  Filled 2020-12-19: qty 2

## 2020-12-19 MED ORDER — MAGNESIUM SULFATE 2 GM/50ML IV SOLN
2.0000 g | INTRAVENOUS | Status: AC
  Administered 2020-12-19: 2 g via INTRAVENOUS
  Filled 2020-12-19: qty 50

## 2020-12-19 MED ORDER — ONDANSETRON 4 MG PO TBDP: 4.0000 mg | ORAL_TABLET | Freq: Three times a day (TID) | ORAL | 0 refills | Status: DC | PRN

## 2020-12-19 MED ORDER — POTASSIUM CHLORIDE 10 MEQ/100ML IV SOLN
10.0000 meq | Freq: Once | INTRAVENOUS | Status: AC
  Administered 2020-12-19: 10 meq via INTRAVENOUS
  Filled 2020-12-19: qty 100

## 2020-12-19 MED ORDER — FENTANYL CITRATE (PF) 100 MCG/2ML IJ SOLN
100.0000 ug | Freq: Once | INTRAMUSCULAR | Status: AC
  Administered 2020-12-19: 100 ug via INTRAVENOUS
  Filled 2020-12-19: qty 2

## 2020-12-19 MED ORDER — SODIUM CHLORIDE 0.9 % IV BOLUS
1000.0000 mL | Freq: Once | INTRAVENOUS | Status: AC
  Administered 2020-12-19: 1000 mL via INTRAVENOUS

## 2020-12-19 NOTE — ED Triage Notes (Signed)
Pt states that she had back surgery on 3/2. States since the surgery she has pain from head to toes. States she is here today for LOC. States that this has been going on since before her surgery.

## 2020-12-19 NOTE — ED Provider Notes (Signed)
[facility] EMERGENCY DEPARTMENT Provider Note   CSN: [id number] Arrival date & time: 12/19/20  1857     History Chief Complaint  Patient presents with  . Loss of Consciousness    Annette [name] is a 60 y.o. female.  HPI   This patient is a 60 year old female, she has a history of breast cancer, history of diabetes and prediabetes, history of acid reflux, hypothyroidism, irritable bowel syndrome, unfortunately the patient recently had back surgery, fusion of her lumbar spine with an anterior approach, she states that after the surgery she had increasing pain in her legs which is bilateral, it does not prevent her from ambulating, she states that ambulating does seem to make the pain worse but there is no numbness or weakness associated with it.  She has been taking oxycodone regularly every 4 hours but is almost out of the pain medication.  She reports that since her surgery she has been feeling more and more lightheaded, she is eating very little and in fact today she states she has had absolutely nothing to eat though she has been sipping on some liquids.  She reports that when she stood up to walk across the room with her walker which she has been using since surgery she had a syncopal event passed out and fell to the ground.  This was short-lived, there is no seizure activity, no urinary incontinence and she denies any pain in her upper back neck arms, her head, or face.  She has no abdominal pain.  The patient states that she had some assistance getting up off the ground by her husband and comes with complaint of syncope, dry mouth and nausea.  She has had very little to eat since her injury, she states she is very worried about her legs.  She did see her surgeon yesterday, Dr. Christella Noa, I do not have access to those notes, she states that the staples were removed from the anterior approach, she was told that an MRI would be ordered to further evaluate her back and leg pain, this has not  yet been set, she has not been contacted by the office.  Of note the patient has had 2 visits to the emergency department since her surgery most recently 3 days ago, she was prescribed gabapentin, she has ongoing prescription for oxycodone 7.5 mg tablets  Past Medical History:  Diagnosis Date  . Arthritis    Phreesia 10/14/2020  . Blood transfusion without reported diagnosis    Phreesia 10/14/2020  . Breast cancer ([employer]) 2007   History of  XRT, onTamoxifen  . Bruises easily   . Cancer ([place])    Phreesia 10/14/2020  . Depression   . Depression    Phreesia 10/14/2020  . Diabetes mellitus    prediabetic  . Diabetes mellitus without complication ([place])    Phreesia 10/14/2020  . GERD (gastroesophageal reflux disease)   . Hypertriglyceridemia   . Hypothyroidism    following chemo amnd radiaition for breast cancer, needed replacement short term  . Hypothyroidism (acquired)    replaced x 1 year  . IBS (irritable bowel syndrome)   . Menieres disease    Controlled with triamterene   . Migraines   . Obesity   . Palpitations   . 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[place])   . Sleep apnea 2010   Problems with CPAP    Patient Active Problem List   Diagnosis Date Noted  . Fusion of spine, lumbar region 12/11/2020  . Pain associated with surgical procedure  12/11/2020  . Disc degeneration, lumbar 12/03/2020  . Pure hypercholesterolemia 11/07/2020  . Type 2 diabetes mellitus with hyperglycemia ([place]) 11/07/2020  . Viral URI with cough 10/14/2020  . Skin lesion of chest wall 10/07/2020  . Adrenal mass ([place]) 05/30/2020  . COVID-19 virus infection 11/01/2019  . Lab test positive for detection of COVID-19 virus 11/01/2019  . Neck pain on left side 08/28/2019  . Extensor tenosynovitis of wrist, left 08/28/2019  . Morbid obesity ([place]) 03/25/2019  . Back pain 03/25/2019  . Light headedness 01/08/2019  . At risk for cardiovascular event 01/30/2018  . Palpitations 03/27/2015  . Hyperlipidemia LDL goal  <100 07/15/2014  . GERD (gastroesophageal reflux disease) 10/09/2013  . IBS (irritable bowel syndrome) 10/09/2013  . GAD (generalized anxiety disorder) 05/14/2013  . Metabolic syndrome X 40/98/1191  . Essential hypertension 01/09/2013  . Nicotine dependence 01/09/2013  . Insomnia 08/14/2012  . Type 2 diabetes mellitus with other specified complication ([place]) 47/82/9562  . Pneumonia 02/21/2012  . Respiratory insufficiency following shock, trauma, or surgery 02/21/2012  . Acute blood loss anemia 02/16/2012  . History of partial thyroidectomy 12/31/2011  . Adrenal adenoma 08/06/2011  . Meniere's disease 04/22/2011  . Sleep apnea 04/22/2011  . ABNORMAL THYROID FUNCTION TESTS 08/17/2010  . Vitamin D deficiency 01/15/2010  . IBS 01/01/2010  . BREAST CANCER, HX OF 01/01/2010    Past Surgical History:  Procedure Laterality Date  . ABDOMINAL EXPOSURE N/A 12/03/2020   Procedure: ABDOMINAL EXPOSURE;  Surgeon: Rosetta Posner, MD;  Location: [facility] OR;  Service: Vascular;  Laterality: N/A;  . ABDOMINAL HYSTERECTOMY  1998   Benign, fibroids  . 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 ANTERIOR LUMBAR FUSION N/A 12/03/2020   Procedure: Lumbar Four-Five Lumbar Five Sacral One Anterior lumbar interbody fusion;  Surgeon: Ashok Pall, MD;  Location: [facility];  Service: Neurosurgery;  Laterality: N/A;  . APPENDECTOMY  06/12/08  . BIOPSY  07/02/2020   Procedure: BIOPSY;  Surgeon: Harvel Quale, MD;  [facility];  Service: Gastroenterology;;  . BREAST LUMPECTOMY Left 2008  . BREAST RECONSTRUCTION     Left reconstructive  . BREAST SURGERY  2007   Left lumpectomy  . BREAST SURGERY     Mammosite - right side  . CHOLECYSTECTOMY  1985  . COLONOSCOPY WITH PROPOFOL N/A 07/02/2020   Procedure: COLONOSCOPY WITH PROPOFOL;  Surgeon: Harvel Quale, MD;  [facility];  Service: Gastroenterology;  Laterality: N/A;  1045  . ESOPHAGEAL  DILATION  12/19/2015   Procedure: ESOPHAGEAL DILATION;  Surgeon: Rogene Houston, MD;  [facility];  Service: Endoscopy;;  . ESOPHAGOGASTRODUODENOSCOPY N/A 12/19/2015   Procedure: ESOPHAGOGASTRODUODENOSCOPY (EGD);  Surgeon: Rogene Houston, MD;  [facility];  Service: Endoscopy;  Laterality: N/A;  11:40  . POLYPECTOMY  07/02/2020   Procedure: POLYPECTOMY;  Surgeon: Montez Morita, Quillian Quince, MD;  [facility];  Service: Gastroenterology;;  . SPLENECTOMY, TOTAL  11/03/8655   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  . THYROIDECTOMY, PARTIAL  11/05/2010   Benign disease     OB History   No obstetric history on file.     Family History  Problem Relation Age of Onset  . Diabetes Mother   . Arrhythmia Mother   . Heart disease Father   . Hyperlipidemia Father   . Arrhythmia Father   . Cancer Paternal Grandmother        Breast  . Diabetes  Sister   . Diabetes Brother   . Heart disease Brother     Social History   Tobacco Use  . Smoking status: Current Every Day Smoker    Packs/day: 0.50    Years: 40.00    Pack years: 20.00    Types: Cigarettes  . Smokeless tobacco: Never Used  . Tobacco comment: 1/2 pack a day  Vaping Use  . Vaping Use: Never used  Substance Use Topics  . Alcohol use: No    Alcohol/week: 0.0 standard drinks    Comment: Encouraged to quit smoking. She has tried the nicotone gum and patches but did not help  . Drug use: No    Home Medications Prior to Admission medications   Medication Sig Start Date End Date Taking? Authorizing Provider  ondansetron (ZOFRAN ODT) 4 MG disintegrating tablet Take 1 tablet (4 mg total) by mouth every 8 (eight) hours as needed for nausea. 12/19/20  Yes Noemi Chapel, MD  aspirin 81 MG EC tablet Take 81 mg by mouth daily.    [provider]  CLOTRIMAZOLE EX Apply 1 application topically daily as needed (itching an breast). 15 %    [provider]  cyclobenzaprine  (FLEXERIL) 10 MG tablet Take 10 mg by mouth daily as needed for muscle spasms.    [provider]  cyclobenzaprine (FLEXERIL) 10 MG tablet Take 1 tablet (10 mg total) by mouth 3 (three) times daily as needed for muscle spasms. 12/06/20   Earnie Larsson, MD  Dulaglutide (TRULICITY) 1.5 HA/1.9FX SOPN Inject 0.5 mLs (1.5 mg total) into the skin once a week. 04/17/20   Philemon Kingdom, MD  ergocalciferol (VITAMIN D2) 1.25 MG (50000 UT) capsule Take 50,000 Units by mouth once a week.    [provider]  fluconazole (DIFLUCAN) 150 MG tablet TAKE ONE TABLET ONCE DAILY , AS NEEDED, FOR VAGINAL ITCH ASSOCIATED WITH ANTIBIOTIC USE Patient taking differently: Take 150 mg by mouth See admin instructions. TAKE ONE TABLET ONCE DAILY , AS NEEDED, FOR VAGINAL ITCH ASSOCIATED WITH ANTIBIOTIC USE 10/07/20   Fayrene Helper, MD  gabapentin (NEURONTIN) 100 MG capsule Take 2 capsules (200 mg total) by mouth 3 (three) times daily. In three days, increase to 300 mg TID 06/06/39   Delora Fuel, MD  glipiZIDE (GLUCOTROL XL) 5 MG 24 hr tablet Take 1 tablet (5 mg total) by mouth daily with breakfast. 06/24/20   Philemon Kingdom, MD  glucose blood test strip See admin instructions. 01/31/20   [provider]  insulin degludec (TRESIBA FLEXTOUCH) 200 UNIT/ML FlexTouch Pen Inject 50 Units into the skin daily. Patient taking differently: Inject 50 Units into the skin at bedtime. 09/29/20   Philemon Kingdom, MD  Insulin Pen Needle (PEN NEEDLES) 31G X 6 MM MISC See admin instructions. 01/31/20   [provider]  LORazepam (ATIVAN) 1 MG tablet Take 1 mg by mouth daily as needed (Nausea).    [provider]  Metoprolol Tartrate 37.5 MG TABS TAKE 1 TABLET BY MOUTH TWICE DAILY Patient taking differently: Take 37.5 mg by mouth 2 (two) times daily. 07/25/20   Fayrene Helper, MD  naproxen (NAPROSYN) 500 MG tablet Take 500 mg by mouth daily as needed for headache.    [provider]   omeprazole (PRILOSEC) 40 MG capsule Take 1 capsule (40 mg total) by mouth daily. 10/11/20   Fayrene Helper, MD  [facility] VERIO test strip USE AS DIRECTED FOUR TIMES DAILY 08/22/20   Fayrene Helper, MD  oxyCODONE (OXYCONTIN) 10 mg 12 hr tablet Take 1 tablet (10 mg total) by mouth every 12 (twelve) hours. 12/06/20   Earnie Larsson, MD  oxyCODONE-acetaminophen (PERCOCET) 7.5-325 MG tablet Take 1 tablet by mouth every 4 (four) hours as needed for severe pain. 05/27/22   Delora Fuel, MD  rosuvastatin (CRESTOR) 20 MG tablet Take 20 mg by mouth daily.    [provider]  temazepam (RESTORIL) 15 MG capsule Take 1 capsule (15 mg total) by mouth at bedtime. 11/03/20   Fayrene Helper, MD  traZODone (DESYREL) 100 MG tablet Take 100 mg by mouth at bedtime.    [provider]  triamterene-hydrochlorothiazide (MAXZIDE-25) 37.5-25 MG tablet Take 1 tablet by mouth daily.    [provider]  venlafaxine XR (EFFEXOR-XR) 75 MG 24 hr capsule Take 75 mg by mouth daily with breakfast.    [provider]    Allergies    Penicillins, Metformin and related, Wound dressing adhesive, Empagliflozin, Penicillin g, Statins, and Zetia [ezetimibe]  Review of Systems   Review of Systems  All other systems reviewed and are negative.   Physical Exam Updated Vital Signs BP (!) 121/109   Pulse (!) 130   Temp 98.1 F (36.7 C)   Resp 19   Ht 1.651 m (5\' 5" )   Wt 96.2 kg   SpO2 96%   BMI 35.29 kg/m   Physical Exam Vitals and nursing note reviewed.  Constitutional:      General: She is not in acute distress.    Appearance: She is well-developed.  HENT:     Head: Normocephalic and atraumatic.     Mouth/Throat:     Mouth: Mucous membranes are dry.     Pharynx: No oropharyngeal exudate.  Eyes:     General: No scleral icterus.       Right eye: No discharge.        Left eye: No discharge.     Conjunctiva/sclera: Conjunctivae normal.     Pupils: Pupils are equal, round,  and reactive to light.  Neck:     Thyroid: No thyromegaly.     Vascular: No JVD.  Cardiovascular:     Rate and Rhythm: Regular rhythm. Tachycardia present.     Heart sounds: Normal heart sounds. No murmur heard. No friction rub. No gallop.   Pulmonary:     Effort: Pulmonary effort is normal. No respiratory distress.     Breath sounds: Normal breath sounds. No wheezing or rales.  Abdominal:     General: Bowel sounds are normal. There is no distension.     Palpations: Abdomen is soft. There is no mass.     Tenderness: There is abdominal tenderness.     Comments: Mild tenderness around the surgical site but the wound is clean dry and intact without any significant erythema and there is no drainage discharge or foul smell  Musculoskeletal:        General: Tenderness present. Normal range of motion.     Cervical back: Normal range of motion and neck supple.     Comments: The patient has normal pulses in her feet bilaterally, normal capillary refill, she is able to move both legs, normal straight leg raise, normal strength at the knees and the hips and the ankles bilaterally.  Sensation is intact.  Lymphadenopathy:     Cervical: No cervical adenopathy.  Skin:    General: Skin is warm and dry.     Findings: No erythema or rash.  Neurological:  Mental Status: She is alert.     Coordination: Coordination normal.  Psychiatric:        Behavior: Behavior normal.     ED Results / Procedures / Treatments   Labs (all labs ordered are listed, but only abnormal results are displayed) Labs Reviewed  CBC WITH DIFFERENTIAL/PLATELET - Abnormal; Notable for the following components:      Result Value   WBC 14.5 (*)    Hemoglobin 15.4 (*)    Platelets 560 (*)    Neutro Abs 9.6 (*)    All other components within normal limits  COMPREHENSIVE METABOLIC PANEL - Abnormal; Notable for the following components:   Potassium 3.0 (*)    Glucose, Bld 124 (*)    All other components within normal  limits  URINALYSIS, ROUTINE W REFLEX MICROSCOPIC - Abnormal; Notable for the following components:   Hgb urine dipstick SMALL (*)    Bacteria, UA RARE (*)    All other components within normal limits    EKG EKG Interpretation  Date/Time:  Friday December 19 2020 19:18:54 EDT Ventricular Rate:  154 PR Interval:    QRS Duration: 80 QT Interval:  326 QTC Calculation: 522 R Axis:   38 Text Interpretation: rapid tachycardia - narrow complex Nonspecific ST abnormality Since last tracing tachycardia now present Abnormal ECG Confirmed by Noemi Chapel [phone]) on 12/19/2020 7:29:39 PM   EKG Interpretation  Date/Time:  Friday December 19 2020 19:27:06 EDT Ventricular Rate:  114 PR Interval:    QRS Duration: 96 QT Interval:  333 QTC Calculation: 459 R Axis:   49 Text Interpretation: Sinus tachycardia RSR' in V1 or V2, right VCD or RVH Since last tracing rate slower Confirmed by Noemi Chapel [phone]) on 12/19/2020 7:48:22 PM        Radiology No results found.  Procedures Procedures   Medications Ordered in ED Medications  potassium chloride 10 mEq in 100 mL IVPB (10 mEq Intravenous New Bag/Given 12/19/20 2223)  magnesium sulfate IVPB 2 g 50 mL (2 g Intravenous New Bag/Given 12/19/20 2226)  oxyCODONE-acetaminophen (PERCOCET/ROXICET) 5-325 MG per tablet 2 tablet (2 tablets Oral Given 12/19/20 2247)  sodium chloride 0.9 % bolus 1,000 mL (0 mLs Intravenous Stopped 12/19/20 2213)  sodium chloride 0.9 % bolus 1,000 mL (0 mLs Intravenous Stopped 12/19/20 2213)  fentaNYL (SUBLIMAZE) injection 100 mcg (100 mcg Intravenous Given 12/19/20 2013)    ED Course  I have reviewed the triage vital signs and the nursing notes.  Pertinent labs & imaging results that were available during my care of the patient were reviewed by me and considered in my medical decision making (see chart for details).  Clinical Course as of 12/19/20 2258  Fri Dec 19, 2020  2211 The patient has had significant changes with  orthostatic vital signs, please see medical record for those details.  IV fluids given, tachycardia has now resolved, potassium will be replaced as well as magnesium. [BM]    Clinical Course User Index [BM] Noemi Chapel, MD   MDM Rules/Calculators/A&P                          The patient is tachycardic, initially her heart rate was at about 155 bpm however on repeat exam the EKG was performed and her heart rate is now 114 and what appears to be a sinus tachycardia.  I suspect that her syncopal event, her lack of food intake, lack of oral intake and tachycardia is related to  being dehydrated.  She is likely orthostatic.  Blood pressure currently 208 systolic in a supine position.  She will need IV fluid rehydration, some pain control, check labs to make sure she has no renal dysfunction or complications of being dehydrated.  Her pain seems to be consistent since surgery and at this time I do not think she needs advanced neuroimaging done emergently as we do not have it here after hours anyway.  Neurologically she is intact and her surgeon is ordering the MRI according to the patient.  The patient is agreeable to the plan.  EKG shows no signs of ischemia, syncopal event likely related to orthostasis and hypotension from dehydration  Patient has received IV fluids, she has a urinalysis without ketones, she has no signs of infection, she has normal neurologic exam, she does have orthostatic changes when she stands but is received IV fluids and feels better.  She will eat and drink before she goes home, she does not want any more IV fluids, she is willing to take potassium and magnesium to replace her low potassium.  Can follow-up outpatient, can follow-up with Dr. Christella Noa regarding MRI and she understands indications for return.  Both she and spouse are's agreeable to the  Final Clinical Impression(s) / ED Diagnoses Final diagnoses:  Syncope, unspecified syncope type  Dehydration  Postoperative pain     Rx / DC Orders ED Discharge Orders         Ordered    ondansetron (ZOFRAN ODT) 4 MG disintegrating tablet  Every 8 hours PRN        12/19/20 2255           Noemi Chapel, MD 12/19/20 2258

## 2020-12-19 NOTE — ED Notes (Signed)
Dr Miller at bedside. 

## 2020-12-19 NOTE — ED Notes (Signed)
Pt given water, crackers and peanut butter per request

## 2020-12-19 NOTE — Discharge Instructions (Addendum)
You are dehydrated which is causing you to have passing out spells when you stand up.  Please follow-up with your family doctor for ongoing pain control of your legs but you must drink more fluids.  I have offered you more IV fluids but you have declined and want to go home, I think that is reasonable but you must continue to drink plenty of fluids at home and start to get better calories even if it means drinking Ensure shakes or other liquid meals.  Please call Dr. Christella Noa on Monday to find out when her MRI is scheduled, return to the ER for worsening symptoms  Zofran as needed for nausea

## 2020-12-23 ENCOUNTER — Encounter: Payer: Self-pay | Admitting: Family Medicine

## 2020-12-24 ENCOUNTER — Other Ambulatory Visit: Payer: Self-pay | Admitting: Family Medicine

## 2021-01-05 ENCOUNTER — Other Ambulatory Visit: Payer: Self-pay | Admitting: Family Medicine

## 2021-01-06 ENCOUNTER — Other Ambulatory Visit: Payer: Self-pay

## 2021-01-06 ENCOUNTER — Ambulatory Visit: Payer: BC Managed Care – PPO | Admitting: Internal Medicine

## 2021-01-06 ENCOUNTER — Encounter: Payer: Self-pay | Admitting: Internal Medicine

## 2021-01-06 VITALS — BP 110/60 | HR 105 | Ht 65.0 in | Wt 201.0 lb

## 2021-01-06 DIAGNOSIS — E1165 Type 2 diabetes mellitus with hyperglycemia: Secondary | ICD-10-CM

## 2021-01-06 DIAGNOSIS — Z794 Long term (current) use of insulin: Secondary | ICD-10-CM

## 2021-01-06 DIAGNOSIS — E669 Obesity, unspecified: Secondary | ICD-10-CM

## 2021-01-06 DIAGNOSIS — E782 Mixed hyperlipidemia: Secondary | ICD-10-CM

## 2021-01-06 NOTE — Progress Notes (Signed)
Patient ID: Annette [name], female   DOB: Apr 04, 1961, 60 y.o.   MRN: [id number]   This visit occurred during the SARS-CoV-2 public health emergency.  Safety protocols were in place, including screening questions prior to the visit, additional usage of staff PPE, and extensive cleaning of exam room while observing appropriate contact time as indicated for disinfecting solutions.   HPI: Annette [name] is a 60 y.o.-year-old female, referred by her PCP, Dr. Moshe Cipro, for management of DM2, dx in 2018, insulin-dependent since 03/2019, uncontrolled, without long-term complications.  She previously saw Dr. Dorris Fetch and Dr. Chalmers Cater but then switched to seeing me.  Last visit 3 months ago.  Interim history: Since last visit, she had back surgery last month (12/03/2020).  After surgery, she presented to the ED 3x, including with syncope on 12/19/2020.  This was deemed to be due to postoperative pain and dehydration.  She still has significant B leg pain >> needs opiates  -hydrocodone did not work for her, but she got OxyContin 15 mg from a friend and this worked.  However, she is not prescribed this.  She will have an MRI of her back soon.   Reviewed HbA1c levels: Lab Results  Component Value Date   HGBA1C 6.4 (H) 12/03/2020   HGBA1C 6.6 (A) 09/29/2020   HGBA1C 7.0 (A) 06/24/2020   HGBA1C 9.4 (A) 03/11/2020   HGBA1C 7.5 (H) 07/25/2019   HGBA1C 8.3 (H) 03/12/2019   HGBA1C 8.4 (H) 11/21/2018   HGBA1C 8.0 (H) 08/08/2018   HGBA1C 6.9 (H) 01/19/2018   HGBA1C 7.2 (H) 06/14/2017   Previously on: - Glipizide XL 20 mg before breakfast - Lantus 50 units at bedtime (dose increased 04/08/2020) She could not tolerate Metformin due to GI intolerance - abdominal pain and diarrhea. She had vaginal yeast infections and UTIs from [employer]. Previously on Tresiba, but unclear why changed to Lantus.  We changed to: - Glipizide XL 10 >> 5 mg before breakfast - Trulicity 2.70 >> 1.5 mg weekly - started 04/2020 -tolerated  well - Tresiba 50 units at bedtime  >> Toujeo 50 >> 56 units at bedtime >> Tresiba 50 units daily  Pt checks her sugars twice a day: - am: 89-137, 152 >> 76-126 Annette [name]), 105-190 (Toujeo) >> 62, 79-141 - 2h after b'fast: n/c - before lunch: n/c - 2h after lunch: n/c >> 64 >> n/c - before dinner: 51-119, 141, 144, 237 >> 59-159, 162 >> 60-144 - 2h after dinner: 243, 254 >> 175, 198 >> n/c >> 133, 143, 177 - bedtime: n/c >> 94 >> n/c - nighttime: n/c Lowest sugar was 57 >>  93 >> 51 >> 58; she has hypoglycemia awareness in the 90s.  Highest sugar was 425 >> 237 (church meal) >> 252.  Glucometer: One Touch verio  At last visit, she was eating only 1 meal a day, but now she eats ~2. She only drinks unsweetened tea and coffee.  -She has no history of CKD, last BUN/creatinine:  Lab Results  Component Value Date   BUN 15 12/19/2020   BUN 16 12/11/2020   CREATININE 0.93 12/19/2020   CREATININE 1.01 (H) 12/11/2020  Not on ACE inhibitor/ARB.  + HL; last set of lipids: Lab Results  Component Value Date   CHOL 131 10/07/2020   HDL 43 10/07/2020   LDLCALC 66 10/07/2020   TRIG 124 10/07/2020   CHOLHDL 3.0 10/07/2020  On Crestor 20. She was also on Zetia but she did not feel well on it then stopped.  -  last eye exam was in fall 2020: No DR reportedly.  She sometimes has double vision.  -no numbness but some tingling in her feet.  Pt has FH of DM in mother, brother, sister.  She also has a history of HTN, OSA.  She had COVID-19 10/2019. She recovered well afterwards  She also has a history of partial thyroidectomy and also adrenalectomy for benign adrenal nodule by Dr. Celine Ahr.  Reviewed latest TSH: Lab Results  Component Value Date   TSH 2.230 02/20/2020   ROS: Constitutional: no weight gain/+ weight loss, no fatigue Eyes: no blurry vision, no xerophthalmia ENT: no sore throat, no nodules palpated in neck, no dysphagia, no odynophagia, no hoarseness Cardiovascular: no  CP/no SOB/no palpitations/no leg swelling Respiratory: no cough/no SOB/no wheezing Gastrointestinal: no N/no V/no D/no C/no acid reflux Musculoskeletal: + muscle aches/+ joint aches Skin: no rashes, no hair loss Neurological: no tremors/no numbness/no tingling/no dizziness  I reviewed pt's medications, allergies, PMH, social hx, family hx, and changes were documented in the history of present illness. Otherwise, unchanged from my initial visit note.  Past Medical History:  Diagnosis Date  . Arthritis    Phreesia 10/14/2020  . Blood transfusion without reported diagnosis    Phreesia 10/14/2020  . Breast cancer ([place]) 2007   History of  XRT, onTamoxifen  . Bruises easily   . Cancer ([place])    Phreesia 10/14/2020  . Depression   . Depression    Phreesia 10/14/2020  . Diabetes mellitus    prediabetic  . Diabetes mellitus without complication ([place])    Phreesia 10/14/2020  . GERD (gastroesophageal reflux disease)   . Hypertriglyceridemia   . Hypothyroidism    following chemo amnd radiaition for breast cancer, needed replacement short term  . Hypothyroidism (acquired)    replaced x 1 year  . IBS (irritable bowel syndrome)   . Menieres disease    Controlled with triamterene   . Migraines   . Obesity   . Palpitations   . 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[place])   . Sleep apnea 2010   Problems with CPAP   Past Surgical History:  Procedure Laterality Date  . ABDOMINAL EXPOSURE N/A 12/03/2020   Procedure: ABDOMINAL EXPOSURE;  Surgeon: Rosetta Posner, MD;  Location: [facility] OR;  Service: Vascular;  Laterality: N/A;  . ABDOMINAL HYSTERECTOMY  1998   Benign, fibroids  . 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 ANTERIOR LUMBAR FUSION N/A 12/03/2020   Procedure: Lumbar Four-Five Lumbar Five Sacral One Anterior lumbar interbody fusion;  Surgeon: Ashok Pall, MD;  Location: [place];  Service: Neurosurgery;  Laterality: N/A;  . APPENDECTOMY  06/12/08  . BIOPSY   07/02/2020   Procedure: BIOPSY;  Surgeon: Harvel Quale, MD;  [facility];  Service: Gastroenterology;;  . BREAST LUMPECTOMY Left 2008  . BREAST RECONSTRUCTION     Left reconstructive  . BREAST SURGERY  2007   Left lumpectomy  . BREAST SURGERY     Mammosite - right side  . CHOLECYSTECTOMY  1985  . COLONOSCOPY WITH PROPOFOL N/A 07/02/2020   Procedure: COLONOSCOPY WITH PROPOFOL;  Surgeon: Harvel Quale, MD;  [facility];  Service: Gastroenterology;  Laterality: N/A;  1045  . ESOPHAGEAL DILATION  12/19/2015   Procedure: ESOPHAGEAL DILATION;  Surgeon: Rogene Houston, MD;  [facility];  Service: Endoscopy;;  . ESOPHAGOGASTRODUODENOSCOPY N/A 12/19/2015   Procedure: ESOPHAGOGASTRODUODENOSCOPY (EGD);  Surgeon: Rogene Houston, MD;  [facility];  Service: Endoscopy;  Laterality: N/A;  11:40  . POLYPECTOMY  07/02/2020   Procedure: POLYPECTOMY;  Surgeon: Montez Morita, Quillian Quince, MD;  [facility];  Service: Gastroenterology;;  . SPLENECTOMY, TOTAL  7/61/6073   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  . THYROIDECTOMY, PARTIAL  11/05/2010   Benign disease   Social History   Socioeconomic History  . Marital status: Married    Spouse name: Alroy Dust  . Number of children: 2  . Years of education: Not on file  . Highest education level: Not on file  Occupational History  . Not on file  Tobacco Use  . Smoking status: Current Every Day Smoker    Packs/day: 0.50    Years: 40.00    Pack years: 20.00    Types: Cigarettes  . Smokeless tobacco: Never Used  . Tobacco comment: 1/2 pack a day  Vaping Use  . Vaping Use: Never used  Substance and Sexual Activity  . Alcohol use: No    Alcohol/week: 0.0 standard drinks    Comment: Encouraged to quit smoking. She has tried the nicotone gum and patches but did not help  . Drug use: No  . Sexual activity: Yes    Birth control/protection: Surgical  Other Topics  Concern  . Not on file  Social History Narrative  . Not on file   Social Determinants of Health   Financial Resource Strain: Medium Risk  . Difficulty of Paying Living Expenses: Somewhat hard  Food Insecurity: No Food Insecurity  . Worried About Charity fundraiser in the Last Year: Never true  . Ran Out of Food in the Last Year: Never true  Transportation Needs: No Transportation Needs  . Lack of Transportation (Medical): No  . Lack of Transportation (Non-Medical): No  Physical Activity: Inactive  . Days of Exercise per Week: 0 days  . Minutes of Exercise per Session: 0 min  Stress: Stress Concern Present  . Feeling of Stress : To some extent  Social Connections: Moderately Integrated  . Frequency of Communication with Friends and Family: Three times a week  . Frequency of Social Gatherings with Friends and Family: Three times a week  . Attends Religious Services: More than 4 times per year  . Active Member of Clubs or Organizations: No  . Attends Archivist Meetings: Never  . Marital Status: Married  Human resources officer Violence: Not At Risk  . Fear of Current or Ex-Partner: No  . Emotionally Abused: No  . Physically Abused: No  . Sexually Abused: No   Current Outpatient Medications on File Prior to Visit  Medication Sig Dispense Refill  . aspirin 81 MG EC tablet Take 81 mg by mouth daily.    Marland Kitchen CLOTRIMAZOLE EX Apply 1 application topically daily as needed (itching an breast). 15 %    . cyclobenzaprine (FLEXERIL) 10 MG tablet Take 10 mg by mouth daily as needed for muscle spasms.    . cyclobenzaprine (FLEXERIL) 10 MG tablet Take 1 tablet (10 mg total) by mouth 3 (three) times daily as needed for muscle spasms. 30 tablet 0  . cyclobenzaprine (FLEXERIL) 10 MG tablet TAKE 1 TABLET BY MOUTH AT NIGHT AS NEEDED FOR MUSCLE SPASM 30 tablet 5  . Dulaglutide (TRULICITY) 1.5 XT/0.6YI SOPN Inject 0.5 mLs (1.5 mg total) into the skin once a week. 4 pen 11  . ergocalciferol  (VITAMIN D2) 1.25 MG (50000 UT) capsule Take 50,000 Units by mouth once a week.    Marland Kitchen  fluconazole (DIFLUCAN) 150 MG tablet TAKE ONE TABLET ONCE DAILY , AS NEEDED, FOR VAGINAL ITCH ASSOCIATED WITH ANTIBIOTIC USE (Patient taking differently: Take 150 mg by mouth See admin instructions. TAKE ONE TABLET ONCE DAILY , AS NEEDED, FOR VAGINAL ITCH ASSOCIATED WITH ANTIBIOTIC USE) 2 tablet 0  . gabapentin (NEURONTIN) 100 MG capsule Take 2 capsules (200 mg total) by mouth 3 (three) times daily. In three days, increase to 300 mg TID 90 capsule 3  . glipiZIDE (GLUCOTROL XL) 5 MG 24 hr tablet Take 1 tablet (5 mg total) by mouth daily with breakfast. 90 tablet 3  . glucose blood test strip See admin instructions.    . insulin degludec (TRESIBA FLEXTOUCH) 200 UNIT/ML FlexTouch Pen Inject 50 Units into the skin daily. (Patient taking differently: Inject 50 Units into the skin at bedtime.) 18 mL 3  . Insulin Pen Needle (PEN NEEDLES) 31G X 6 MM MISC See admin instructions.    Marland Kitchen LORazepam (ATIVAN) 1 MG tablet Take 1 mg by mouth daily as needed (Nausea).    . Metoprolol Tartrate 37.5 MG TABS TAKE 1 TABLET BY MOUTH TWICE DAILY (Patient taking differently: Take 37.5 mg by mouth 2 (two) times daily.) 180 tablet 3  . naproxen (NAPROSYN) 500 MG tablet TAKE 1 TABLET(500 MG) BY MOUTH TWICE DAILY WITH A MEAL 40 tablet 0  . omeprazole (PRILOSEC) 40 MG capsule Take 1 capsule (40 mg total) by mouth daily. 30 capsule 5  . ondansetron (ZOFRAN ODT) 4 MG disintegrating tablet Take 1 tablet (4 mg total) by mouth every 8 (eight) hours as needed for nausea. 10 tablet 0  . ONETOUCH VERIO test strip USE AS DIRECTED FOUR TIMES DAILY 150 strip 5  . oxyCODONE (OXYCONTIN) 10 mg 12 hr tablet Take 1 tablet (10 mg total) by mouth every 12 (twelve) hours. 14 tablet 0  . oxyCODONE-acetaminophen (PERCOCET) 7.5-325 MG tablet Take 1 tablet by mouth every 4 (four) hours as needed for severe pain. 20 tablet 0  . rosuvastatin (CRESTOR) 20 MG tablet Take 20  mg by mouth daily.    . temazepam (RESTORIL) 15 MG capsule Take 1 capsule (15 mg total) by mouth at bedtime. 30 capsule 5  . traZODone (DESYREL) 100 MG tablet Take 100 mg by mouth at bedtime.    . triamterene-hydrochlorothiazide (MAXZIDE-25) 37.5-25 MG tablet Take 1 tablet by mouth daily.    Marland Kitchen venlafaxine XR (EFFEXOR-XR) 75 MG 24 hr capsule TAKE 1 CAPSULE(75 MG) BY MOUTH DAILY 90 capsule 0   No current facility-administered medications on file prior to visit.   Allergies  Allergen Reactions  . Penicillins Swelling    Has patient had a PCN reaction causing immediate rash, facial/tongue/throat swelling, SOB or lightheadedness with hypotension: Yes Has patient had a PCN reaction causing severe rash involving mucus membranes or skin necrosis: No Has patient had a PCN reaction that required hospitalization: No  Has patient had a PCN reaction occurring within the last 10 years: No If all of the above answers are "NO", then may proceed with Cephalosporin use.   Of face and tongue.  . Metformin And Related Diarrhea and Nausea Only    Pt to discontinue med due to intolerance, cramps and diarrhea  . Wound Dressing Adhesive Itching and Rash    Dermabond and other surgical glues for incisions  . Empagliflozin Other (See Comments)    Yeast infection  Jardiance  . Penicillin G Other (See Comments)  . Statins Other (See Comments)    Fell bad but  on Crestor  . Zetia [Ezetimibe] Other (See Comments)    Feels funny   Family History  Problem Relation Age of Onset  . Diabetes Mother   . Arrhythmia Mother   . Heart disease Father   . Hyperlipidemia Father   . Arrhythmia Father   . Cancer Paternal Grandmother        Breast  . Diabetes Sister   . Diabetes Brother   . Heart disease Brother     PE: BP 110/60 (BP Location: Right Arm, Patient Position: Sitting, Cuff Size: Normal)   Pulse (!) 105   Ht 5\' 5"  (1.651 m)   Wt 201 lb (91.2 kg)   SpO2 99%   BMI 33.45 kg/m  Wt Readings from Last  3 Encounters:  01/06/21 201 lb (91.2 kg)  12/19/20 212 lb 1.3 oz (96.2 kg)  12/16/20 212 lb 1.3 oz (96.2 kg)   Constitutional: overweight, in NAD, walks with a walker Eyes: PERRLA, EOMI, no exophthalmos ENT: moist mucous membranes, no thyromegaly, no cervical lymphadenopathy Cardiovascular: Tachycardia RR, No MRG Respiratory: CTA B Gastrointestinal: abdomen soft, NT, ND, BS+ Musculoskeletal: no deformities, strength intact in all 4 Skin: moist, warm, no rashes Neurological: no tremor with outstretched hands, DTR normal in all 4  ASSESSMENT: 1. DM2, insulin-dependent, uncontrolled, without long-term complications, but with hyperglycemia  No family history of medullary thyroid cancer or personal history of pancreatitis.  2. HL  3.  Obesity class II  PLAN:  1. Patient with longstanding, uncontrolled, type 2 diabetes, on oral antidiabetic regimen with sulfonylurea, and also daily long-acting insulin and weekly GLP-1 receptor agonist which significantly improved blood sugars after adding the GLP-1 receptor agonist and starting to improve her diet.  At last visit, HbA1c was excellent, at 6.6% but since then, she had another HbA1c was even better, at 6.4% last month. -At last visit, we did not change her regimen but she was telling me that Annette [name] was expensive (by $15) compared to Toujeo ($35).  However, she was preparing to starting back [place] as we discussed that this is reducing the variability in her blood sugars.  We discussed about reducing the dose of insulin if she switches to [place].  I advised her to let me know if she continued to have mild lows to decrease glipizide further. -At this visit, her sugars are improved and even had some low blood sugars in the 50s and 60s.  Lately, however, sugars have been quite well controlled, but they are lower before dinner.  Therefore, for now, I advised her to stop the glipizide dose.  I advised her that if sugars continue to decrease under 70  of glipizide, to also decrease [place] dose. - I suggested to:  Patient Instructions  Please continue: - Tresiba 50 units daily - Trulicity 1.5 mg weekly  Stop: - Glipizide XL 5 mg   If the sugars continue to decrease under 70 off Glipizide, then decrease Tresiba to 44-46 units.  Please call and schedule an eye appt with Dr. Prudencio Burly: [facility]:  Dr. Sherlyn Lick MD ?  Address: [address], [place], [place] [address]  [phone]  Please return in 3-4 months with your sugar log.   - advised to check sugars at different times of the day - 1x a day, rotating check times - advised for yearly eye exams >> she is not UTD -I again advised her to get an appointment with ophthalmology, but she is now confined to the house due to  her leg pain. - return to clinic in 3-4 months  2. HL -Reviewed latest lipid panel from 10/2020: All fractions at goal Lab Results  Component Value Date   CHOL 131 10/07/2020   HDL 43 10/07/2020   LDLCALC 66 10/07/2020   TRIG 124 10/07/2020   CHOLHDL 3.0 10/07/2020  -Continues Crestor 20 mg daily without side effects  3.  Obesity class II -She continues on a GLP-1 receptor agonist, which is also helping with weight loss -She lost 3 pounds before last visit but 11 pounds since last visit!  She is not eating well because of the pain.  Philemon Kingdom, MD PhD [facility]

## 2021-01-06 NOTE — Patient Instructions (Addendum)
Please continue: - Tresiba 50 units daily - Trulicity 1.5 mg weekly  Stop: - Glipizide XL 5 mg   If the sugars continue to decrease under 70 off Glipizide, then decrease Tresiba to 44-46 units.  Please call and schedule an eye appt with Dr. Prudencio Burly: [facility]:  Dr. Sherlyn Lick MD ?  Address: [address], [place], [place] [address]  [phone]  Please return in 3-4 months with your sugar log.

## 2021-01-07 ENCOUNTER — Other Ambulatory Visit (HOSPITAL_COMMUNITY): Payer: Self-pay | Admitting: Neurosurgery

## 2021-01-07 ENCOUNTER — Encounter: Payer: Self-pay | Admitting: Family Medicine

## 2021-01-07 ENCOUNTER — Other Ambulatory Visit: Payer: Self-pay | Admitting: Neurosurgery

## 2021-01-07 DIAGNOSIS — M5136 Other intervertebral disc degeneration, lumbar region: Secondary | ICD-10-CM

## 2021-01-08 ENCOUNTER — Other Ambulatory Visit: Payer: Self-pay | Admitting: Internal Medicine

## 2021-01-08 ENCOUNTER — Other Ambulatory Visit: Payer: Self-pay | Admitting: Family Medicine

## 2021-01-08 ENCOUNTER — Telehealth: Payer: Self-pay

## 2021-01-08 NOTE — Telephone Encounter (Signed)
In regards to pts MyChart note from today, pt returned my phone call back. She says she has been to the ER 3 times for the pain in her legs from her recent surgery and nothing has helped. She has called her surgeon and they won't give her another Rx for her pain. She admitted to getting an Oxycontin from a friend and was going to get a few more this weekend because she cannot take being in pain. She says she will call on Monday to discuss with you. Just wanted you aware.

## 2021-01-08 NOTE — Telephone Encounter (Signed)
Is there anything you can advise pt until Moshe Cipro returns next week?

## 2021-01-10 ENCOUNTER — Other Ambulatory Visit: Payer: Self-pay | Admitting: Family Medicine

## 2021-01-10 DIAGNOSIS — E785 Hyperlipidemia, unspecified: Secondary | ICD-10-CM

## 2021-01-12 ENCOUNTER — Encounter: Payer: Self-pay | Admitting: Family Medicine

## 2021-01-12 ENCOUNTER — Other Ambulatory Visit: Payer: Self-pay

## 2021-01-12 MED ORDER — TRAZODONE HCL 100 MG PO TABS: 100.0000 mg | ORAL_TABLET | Freq: Every day | ORAL | 1 refills | Status: DC

## 2021-01-12 MED ORDER — ROSUVASTATIN CALCIUM 20 MG PO TABS: 20.0000 mg | ORAL_TABLET | Freq: Every day | ORAL | 1 refills | Status: DC

## 2021-01-12 NOTE — Telephone Encounter (Signed)
Pt informed these were sent in.

## 2021-01-16 ENCOUNTER — Encounter (HOSPITAL_COMMUNITY): Payer: Self-pay | Admitting: Emergency Medicine

## 2021-01-16 ENCOUNTER — Emergency Department (HOSPITAL_COMMUNITY): Payer: BC Managed Care – PPO

## 2021-01-16 ENCOUNTER — Other Ambulatory Visit: Payer: Self-pay

## 2021-01-16 ENCOUNTER — Inpatient Hospital Stay (HOSPITAL_COMMUNITY)
Admission: EM | Admit: 2021-01-16 | Discharge: 2021-01-23 | DRG: 312 | Disposition: A | Discharge status: Home / Self Care | Payer: BC Managed Care – PPO | Attending: Internal Medicine | Admitting: Internal Medicine

## 2021-01-16 DIAGNOSIS — F419 Anxiety disorder, unspecified: Secondary | ICD-10-CM | POA: Diagnosis present

## 2021-01-16 DIAGNOSIS — Z794 Long term (current) use of insulin: Secondary | ICD-10-CM

## 2021-01-16 DIAGNOSIS — Z888 Allergy status to other drugs, medicaments and biological substances status: Secondary | ICD-10-CM

## 2021-01-16 DIAGNOSIS — E669 Obesity, unspecified: Secondary | ICD-10-CM | POA: Diagnosis present

## 2021-01-16 DIAGNOSIS — M79605 Pain in left leg: Secondary | ICD-10-CM | POA: Diagnosis present

## 2021-01-16 DIAGNOSIS — M79606 Pain in leg, unspecified: Secondary | ICD-10-CM

## 2021-01-16 DIAGNOSIS — I700 Atherosclerosis of aorta: Secondary | ICD-10-CM

## 2021-01-16 DIAGNOSIS — R55 Syncope and collapse: Secondary | ICD-10-CM | POA: Diagnosis present

## 2021-01-16 DIAGNOSIS — D72829 Elevated white blood cell count, unspecified: Secondary | ICD-10-CM

## 2021-01-16 DIAGNOSIS — R52 Pain, unspecified: Secondary | ICD-10-CM | POA: Diagnosis present

## 2021-01-16 DIAGNOSIS — E785 Hyperlipidemia, unspecified: Secondary | ICD-10-CM | POA: Diagnosis present

## 2021-01-16 DIAGNOSIS — Z791 Long term (current) use of non-steroidal anti-inflammatories (NSAID): Secondary | ICD-10-CM

## 2021-01-16 DIAGNOSIS — Z86010 Personal history of colonic polyps: Secondary | ICD-10-CM

## 2021-01-16 DIAGNOSIS — K219 Gastro-esophageal reflux disease without esophagitis: Secondary | ICD-10-CM | POA: Diagnosis present

## 2021-01-16 DIAGNOSIS — Z79899 Other long term (current) drug therapy: Secondary | ICD-10-CM

## 2021-01-16 DIAGNOSIS — R630 Anorexia: Secondary | ICD-10-CM

## 2021-01-16 DIAGNOSIS — Z8249 Family history of ischemic heart disease and other diseases of the circulatory system: Secondary | ICD-10-CM

## 2021-01-16 DIAGNOSIS — I10 Essential (primary) hypertension: Secondary | ICD-10-CM | POA: Diagnosis present

## 2021-01-16 DIAGNOSIS — Z20822 Contact with and (suspected) exposure to covid-19: Secondary | ICD-10-CM | POA: Diagnosis present

## 2021-01-16 DIAGNOSIS — Z7952 Long term (current) use of systemic steroids: Secondary | ICD-10-CM

## 2021-01-16 DIAGNOSIS — K529 Noninfective gastroenteritis and colitis, unspecified: Secondary | ICD-10-CM | POA: Diagnosis present

## 2021-01-16 DIAGNOSIS — R197 Diarrhea, unspecified: Secondary | ICD-10-CM

## 2021-01-16 DIAGNOSIS — G4733 Obstructive sleep apnea (adult) (pediatric): Secondary | ICD-10-CM | POA: Diagnosis present

## 2021-01-16 DIAGNOSIS — Z6833 Body mass index (BMI) 33.0-33.9, adult: Secondary | ICD-10-CM

## 2021-01-16 DIAGNOSIS — F32A Depression, unspecified: Secondary | ICD-10-CM | POA: Diagnosis present

## 2021-01-16 DIAGNOSIS — Z9049 Acquired absence of other specified parts of digestive tract: Secondary | ICD-10-CM

## 2021-01-16 DIAGNOSIS — M545 Low back pain, unspecified: Secondary | ICD-10-CM | POA: Diagnosis present

## 2021-01-16 DIAGNOSIS — G8929 Other chronic pain: Secondary | ICD-10-CM | POA: Diagnosis present

## 2021-01-16 DIAGNOSIS — Z880 Allergy status to penicillin: Secondary | ICD-10-CM

## 2021-01-16 DIAGNOSIS — Z79890 Hormone replacement therapy (postmenopausal): Secondary | ICD-10-CM

## 2021-01-16 DIAGNOSIS — E278 Other specified disorders of adrenal gland: Secondary | ICD-10-CM

## 2021-01-16 DIAGNOSIS — E1165 Type 2 diabetes mellitus with hyperglycemia: Secondary | ICD-10-CM | POA: Diagnosis present

## 2021-01-16 DIAGNOSIS — Z853 Personal history of malignant neoplasm of breast: Secondary | ICD-10-CM

## 2021-01-16 DIAGNOSIS — Z833 Family history of diabetes mellitus: Secondary | ICD-10-CM

## 2021-01-16 DIAGNOSIS — Z8719 Personal history of other diseases of the digestive system: Secondary | ICD-10-CM

## 2021-01-16 DIAGNOSIS — I951 Orthostatic hypotension: Principal | ICD-10-CM | POA: Diagnosis present

## 2021-01-16 DIAGNOSIS — Z90710 Acquired absence of both cervix and uterus: Secondary | ICD-10-CM

## 2021-01-16 DIAGNOSIS — Z923 Personal history of irradiation: Secondary | ICD-10-CM

## 2021-01-16 DIAGNOSIS — Z981 Arthrodesis status: Secondary | ICD-10-CM

## 2021-01-16 DIAGNOSIS — E11649 Type 2 diabetes mellitus with hypoglycemia without coma: Secondary | ICD-10-CM | POA: Diagnosis not present

## 2021-01-16 DIAGNOSIS — R599 Enlarged lymph nodes, unspecified: Secondary | ICD-10-CM

## 2021-01-16 DIAGNOSIS — E876 Hypokalemia: Secondary | ICD-10-CM | POA: Diagnosis present

## 2021-01-16 DIAGNOSIS — Z83438 Family history of other disorder of lipoprotein metabolism and other lipidemia: Secondary | ICD-10-CM

## 2021-01-16 DIAGNOSIS — Z7982 Long term (current) use of aspirin: Secondary | ICD-10-CM

## 2021-01-16 DIAGNOSIS — E1169 Type 2 diabetes mellitus with other specified complication: Secondary | ICD-10-CM | POA: Diagnosis present

## 2021-01-16 DIAGNOSIS — Z803 Family history of malignant neoplasm of breast: Secondary | ICD-10-CM

## 2021-01-16 DIAGNOSIS — Z9081 Acquired absence of spleen: Secondary | ICD-10-CM

## 2021-01-16 DIAGNOSIS — H8109 Meniere's disease, unspecified ear: Secondary | ICD-10-CM | POA: Diagnosis present

## 2021-01-16 LAB — COMPREHENSIVE METABOLIC PANEL
ALT: 29 U/L (ref 0–44)
AST: 34 U/L (ref 15–41)
Albumin: 4.1 g/dL (ref 3.5–5.0)
Alkaline Phosphatase: 89 U/L (ref 38–126)
Anion gap: 14 (ref 5–15)
BUN: 16 mg/dL (ref 8–23)
CO2: 22 mmol/L (ref 22–32)
Calcium: 10 mg/dL (ref 8.9–10.3)
Chloride: 98 mmol/L (ref 98–111)
Creatinine, Ser: 1.05 mg/dL — ABNORMAL HIGH (ref 0.44–1.00)
GFR, Estimated: >60 mL/min (ref >60)
Glucose, Bld: 140 mg/dL — ABNORMAL HIGH (ref 70–99)
Potassium: 2.9 mmol/L — ABNORMAL LOW (ref 3.5–5.1)
Sodium: 134 mmol/L — ABNORMAL LOW (ref 135–145)
Total Bilirubin: 0.7 mg/dL (ref 0.3–1.2)
Total Protein: 8 g/dL (ref 6.5–8.1)

## 2021-01-16 LAB — CBC WITH DIFFERENTIAL/PLATELET
Abs Immature Granulocytes: 0.03 10*3/uL (ref 0.00–0.07)
Basophils Absolute: 0.1 10*3/uL (ref 0.0–0.1)
Basophils Relative: 1 %
Eosinophils Absolute: 0.1 10*3/uL (ref 0.0–0.5)
Eosinophils Relative: 1 %
HCT: 46.6 % — ABNORMAL HIGH (ref 36.0–46.0)
Hemoglobin: 16 g/dL — ABNORMAL HIGH (ref 12.0–15.0)
Immature Granulocytes: 0 %
Lymphocytes Relative: 34 %
Lymphs Abs: 4.2 10*3/uL — ABNORMAL HIGH (ref 0.7–4.0)
MCH: 33.3 pg (ref 26.0–34.0)
MCHC: 34.3 g/dL (ref 30.0–36.0)
MCV: 96.9 fL (ref 80.0–100.0)
Monocytes Absolute: 1 10*3/uL (ref 0.1–1.0)
Monocytes Relative: 8 %
Neutro Abs: 7 10*3/uL (ref 1.7–7.7)
Neutrophils Relative %: 56 %
Platelets: 421 10*3/uL — ABNORMAL HIGH (ref 150–400)
RBC: 4.81 MIL/uL (ref 3.87–5.11)
RDW: 13 % (ref 11.5–15.5)
WBC: 12.4 10*3/uL — ABNORMAL HIGH (ref 4.0–10.5)
nRBC: 0 % (ref 0.0–0.2)

## 2021-01-16 LAB — D-DIMER, QUANTITATIVE: D-Dimer, Quant: 1.26 ug/mL-FEU — ABNORMAL HIGH (ref 0.00–0.50)

## 2021-01-16 LAB — URINALYSIS, ROUTINE W REFLEX MICROSCOPIC
Bilirubin Urine: NEGATIVE
Glucose, UA: NEGATIVE mg/dL
Ketones, ur: NEGATIVE mg/dL
Leukocytes,Ua: NEGATIVE
Nitrite: NEGATIVE
Protein, ur: NEGATIVE mg/dL
Specific Gravity, Urine: 1.042 — ABNORMAL HIGH (ref 1.005–1.030)
pH: 7 (ref 5.0–8.0)

## 2021-01-16 LAB — TROPONIN I (HIGH SENSITIVITY)
Troponin I (High Sensitivity): 4 ng/L (ref <18)
Troponin I (High Sensitivity): 4 ng/L (ref <18)

## 2021-01-16 LAB — C DIFFICILE QUICK SCREEN W PCR REFLEX
C Diff antigen: NEGATIVE
C Diff interpretation: NOT DETECTED
C Diff toxin: NEGATIVE

## 2021-01-16 LAB — TSH: TSH: 1.835 u[IU]/mL (ref 0.350–4.500)

## 2021-01-16 LAB — LIPASE, BLOOD: Lipase: 86 U/L — ABNORMAL HIGH (ref 11–51)

## 2021-01-16 LAB — MAGNESIUM: Magnesium: 1.9 mg/dL (ref 1.7–2.4)

## 2021-01-16 MED ORDER — IOHEXOL 350 MG/ML SOLN
100.0000 mL | Freq: Once | INTRAVENOUS | Status: AC | PRN
  Administered 2021-01-16: 100 mL via INTRAVENOUS

## 2021-01-16 MED ORDER — SODIUM CHLORIDE 0.9 % IV BOLUS
1000.0000 mL | Freq: Once | INTRAVENOUS | Status: AC
  Administered 2021-01-16: 1000 mL via INTRAVENOUS

## 2021-01-16 MED ORDER — OXYCODONE-ACETAMINOPHEN 5-325 MG PO TABS: 1.0000 | ORAL_TABLET | Freq: Three times a day (TID) | ORAL | 0 refills | Status: DC | PRN

## 2021-01-16 MED ORDER — ONDANSETRON HCL 4 MG/2ML IJ SOLN
4.0000 mg | Freq: Once | INTRAMUSCULAR | Status: AC
  Administered 2021-01-16: 4 mg via INTRAVENOUS
  Filled 2021-01-16: qty 2

## 2021-01-16 MED ORDER — POTASSIUM CHLORIDE 20 MEQ PO PACK
40.0000 meq | PACK | Freq: Once | ORAL | Status: AC
  Administered 2021-01-16: 40 meq via ORAL
  Filled 2021-01-16: qty 2

## 2021-01-16 MED ORDER — PREDNISONE 10 MG PO TABS: 10.0000 mg | ORAL_TABLET | Freq: Every day | ORAL | 0 refills | Status: DC

## 2021-01-16 NOTE — ED Provider Notes (Signed)
[facility] EMERGENCY DEPARTMENT Provider Note   CSN: [id number] Arrival date & time: 01/16/21  1707     History Chief Complaint  Patient presents with  . Abdominal Pain    Annette [name] is a 60 y.o. female.  HPI   60 y/o female with a h/o arthritis, breast cancer, depression, diabetes, GERD, elevated triglycerides, Mnire's disease, IBS, palpitations, pituitary insufficiency, sleep apnea, who presents to the ED today for eval of syncopal episodes.  Patient states that over the last month she has had 3 syncopal episodes.  Triage note indicates patient has had greater than 20 episodes however she clarifies that she has just felt lightheaded this many times.  Her husband states that she had shaking activity one time but otherwise does not seem to have a postictal state after these episodes.  She has had no urinary incontinence or tongue biting with these episodes.  Patient does note that she had surgery 1 month ago and following surgery she has had pain to the bilateral legs which she feels is not controlled.  She has had increased depression and has had decreased p.o. intake secondary to a decreased appetite. She reports some intermittent passive si but denies plan as she knows she needs to be here for her family.   She has had some intermittent vomiting and has also had diarrhea for the last month.  She denies any urinary symptoms or fevers.  She denies any chest pain, shortness of breath, pleuritic pain or cough.  There has been no bilateral lower extremity swelling.  Past Medical History:  Diagnosis Date  . Arthritis    Phreesia 10/14/2020  . Blood transfusion without reported diagnosis    Phreesia 10/14/2020  . Breast cancer ([place]) 2007   History of  XRT, onTamoxifen  . Bruises easily   . Cancer ([place])    Phreesia 10/14/2020  . Depression   . Depression    Phreesia 10/14/2020  . Diabetes mellitus    prediabetic  . Diabetes mellitus without complication ([place])    Phreesia  10/14/2020  . GERD (gastroesophageal reflux disease)   . Hypertriglyceridemia   . Hypothyroidism    following chemo amnd radiaition for breast cancer, needed replacement short term  . Hypothyroidism (acquired)    replaced x 1 year  . IBS (irritable bowel syndrome)   . Menieres disease    Controlled with triamterene   . Migraines   . Obesity   . Palpitations   . 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[place])   . Sleep apnea 2010   Problems with CPAP    Patient Active Problem List   Diagnosis Date Noted  . Fusion of spine, lumbar region 12/11/2020  . Pain associated with surgical procedure 12/11/2020  . Disc degeneration, lumbar 12/03/2020  . Pure hypercholesterolemia 11/07/2020  . Type 2 diabetes mellitus with hyperglycemia ([place]) 11/07/2020  . Viral URI with cough 10/14/2020  . Skin lesion of chest wall 10/07/2020  . Adrenal mass ([place]) 05/30/2020  . COVID-19 virus infection 11/01/2019  . Lab test positive for detection of COVID-19 virus 11/01/2019  . Neck pain on left side 08/28/2019  . Extensor tenosynovitis of wrist, left 08/28/2019  . Morbid obesity ([place]) 03/25/2019  . Back pain 03/25/2019  . Light headedness 01/08/2019  . At risk for cardiovascular event 01/30/2018  . Palpitations 03/27/2015  . Hyperlipidemia LDL goal <100 07/15/2014  . GERD (gastroesophageal reflux disease) 10/09/2013  . IBS (irritable bowel syndrome) 10/09/2013  . GAD (generalized anxiety disorder) 05/14/2013  . Metabolic  syndrome X 05/14/2013  . Essential hypertension 01/09/2013  . Nicotine dependence 01/09/2013  . Insomnia 08/14/2012  . Type 2 diabetes mellitus with other specified complication ([place]) 51/11/5850  . Pneumonia 02/21/2012  . Respiratory insufficiency following shock, trauma, or surgery 02/21/2012  . Acute blood loss anemia 02/16/2012  . History of partial thyroidectomy 12/31/2011  . Adrenal adenoma 08/06/2011  . Meniere's disease 04/22/2011  . Sleep apnea 04/22/2011  . ABNORMAL THYROID FUNCTION  TESTS 08/17/2010  . Vitamin D deficiency 01/15/2010  . IBS 01/01/2010  . BREAST CANCER, HX OF 01/01/2010    Past Surgical History:  Procedure Laterality Date  . ABDOMINAL EXPOSURE N/A 12/03/2020   Procedure: ABDOMINAL EXPOSURE;  Surgeon: Rosetta Posner, MD;  Location: [facility] OR;  Service: Vascular;  Laterality: N/A;  . ABDOMINAL HYSTERECTOMY  1998   Benign, fibroids  . 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 ANTERIOR LUMBAR FUSION N/A 12/03/2020   Procedure: Lumbar Four-Five Lumbar Five Sacral One Anterior lumbar interbody fusion;  Surgeon: Ashok Pall, MD;  Location: [place];  Service: Neurosurgery;  Laterality: N/A;  . APPENDECTOMY  06/12/08  . BIOPSY  07/02/2020   Procedure: BIOPSY;  Surgeon: Harvel Quale, MD;  [facility];  Service: Gastroenterology;;  . BREAST LUMPECTOMY Left 2008  . BREAST RECONSTRUCTION     Left reconstructive  . BREAST SURGERY  2007   Left lumpectomy  . BREAST SURGERY     [place] - right side  . CHOLECYSTECTOMY  1985  . COLONOSCOPY WITH PROPOFOL N/A 07/02/2020   Procedure: COLONOSCOPY WITH PROPOFOL;  Surgeon: Harvel Quale, MD;  Location: [facility];  Service: Gastroenterology;  Laterality: N/A;  1045  . ESOPHAGEAL DILATION  12/19/2015   Procedure: ESOPHAGEAL DILATION;  Surgeon: Rogene Houston, MD;  Location[facility];  Service: Endoscopy;;  . ESOPHAGOGASTRODUODENOSCOPY N/A 12/19/2015   Procedure: ESOPHAGOGASTRODUODENOSCOPY (EGD);  Surgeon: Rogene Houston, MD;  [facility];  Service: Endoscopy;  Laterality: N/A;  11:40  . POLYPECTOMY  07/02/2020   Procedure: POLYPECTOMY;  Surgeon: Montez Morita, Quillian Quince, MD;  Location[facility];  Service: Gastroenterology;;  . SPLENECTOMY, TOTAL  7/78/2423   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  . THYROIDECTOMY, PARTIAL  11/05/2010   Benign disease     OB History   No obstetric history on file.     Family  History  Problem Relation Age of Onset  . Diabetes Mother   . Arrhythmia Mother   . Heart disease Father   . Hyperlipidemia Father   . Arrhythmia Father   . Cancer Paternal Grandmother        Breast  . Diabetes Sister   . Diabetes Brother   . Heart disease Brother     Social History   Tobacco Use  . Smoking status: Current Every Day Smoker    Packs/day: 0.50    Years: 40.00    Pack years: 20.00    Types: Cigarettes  . Smokeless tobacco: Never Used  . Tobacco comment: 1/2 pack a day  Vaping Use  . Vaping Use: Never used  Substance Use Topics  . Alcohol use: No    Alcohol/week: 0.0 standard drinks    Comment: Encouraged to quit smoking. She has tried the nicotone gum and patches but did not help  . Drug use: No    Home Medications Prior to Admission medications   Medication Sig Start Date End Date Taking? Authorizing Provider  CLOTRIMAZOLE EX Apply 1 application topically daily as needed (itching an breast). 15 %   Yes [provider]  cyclobenzaprine (FLEXERIL) 10 MG tablet Take 1 tablet (10 mg total) by mouth 3 (three) times daily as needed for muscle spasms. 12/06/20  Yes Pool, Mallie Mussel, MD  Dulaglutide (TRULICITY) 1.5 JA/2.5KN SOPN Inject 0.5 mLs (1.5 mg total) into the skin once a week. 04/17/20  Yes Philemon Kingdom, MD  ergocalciferol (VITAMIN D2) 1.25 MG (50000 UT) capsule Take 50,000 Units by mouth once a week.   Yes [provider]  fluconazole (DIFLUCAN) 150 MG tablet TAKE ONE TABLET ONCE DAILY , AS NEEDED, FOR VAGINAL ITCH ASSOCIATED WITH ANTIBIOTIC USE Patient taking differently: Take 150 mg by mouth See admin instructions. TAKE ONE TABLET ONCE DAILY , AS NEEDED, FOR VAGINAL ITCH ASSOCIATED WITH ANTIBIOTIC USE 10/07/20  Yes Fayrene Helper, MD  insulin degludec (TRESIBA FLEXTOUCH) 200 UNIT/ML FlexTouch Pen Inject 50 Units into the skin at bedtime. Patient taking differently: Inject 46-48 Units into the skin at bedtime. 01/12/21  Yes Philemon Kingdom, MD  LORazepam (ATIVAN) 1 MG tablet Take 1 mg by mouth daily as needed (Nausea).   Yes [provider]  Melatonin 10 MG TABS Take 1 tablet by mouth daily.   Yes [provider]  Metoprolol Tartrate 37.5 MG TABS TAKE 1 TABLET BY MOUTH TWICE DAILY 01/12/21  Yes Fayrene Helper, MD  naproxen (NAPROSYN) 500 MG tablet TAKE 1 TABLET(500 MG) BY MOUTH TWICE DAILY WITH A MEAL Patient taking differently: Take 500 mg by mouth 2 (two) times daily with a meal. 01/08/21  Yes Fayrene Helper, MD  omeprazole (PRILOSEC) 40 MG capsule Take 1 capsule (40 mg total) by mouth daily. 10/11/20  Yes Fayrene Helper, MD  ondansetron (ZOFRAN ODT) 4 MG disintegrating tablet Take 1 tablet (4 mg total) by mouth every 8 (eight) hours as needed for nausea. 12/19/20  Yes Noemi Chapel, MD  oxyCODONE-acetaminophen (PERCOCET/ROXICET) 5-325 MG tablet Take 1 tablet by mouth every 8 (eight) hours as needed for severe pain. 01/16/21  Yes Kaydra Borgen S, PA-C  predniSONE (DELTASONE) 10 MG tablet Take 1 tablet (10 mg total) by mouth daily for 5 days. 01/16/21 01/21/21 Yes Aalani Aikens S, PA-C  rosuvastatin (CRESTOR) 20 MG tablet Take 1 tablet (20 mg total) by mouth daily. 01/12/21  Yes Fayrene Helper, MD  temazepam (RESTORIL) 15 MG capsule Take 1 capsule (15 mg total) by mouth at bedtime. 11/03/20  Yes Fayrene Helper, MD  traZODone (DESYREL) 100 MG tablet Take 1 tablet (100 mg total) by mouth at bedtime. 01/12/21  Yes Fayrene Helper, MD  triamterene-hydrochlorothiazide (MAXZIDE-25) 37.5-25 MG tablet Take 1 tablet by mouth daily.   Yes [provider]  venlafaxine XR (EFFEXOR-XR) 75 MG 24 hr capsule TAKE 1 CAPSULE(75 MG) BY MOUTH DAILY Patient taking differently: Take 75 mg by mouth daily with breakfast. 01/05/21  Yes Fayrene Helper, MD  aspirin 81 MG EC tablet Take 81 mg by mouth daily. Patient not taking: Reported on 01/16/2021    [provider]  gabapentin (NEURONTIN)  100 MG capsule Take 2 capsules (200 mg total) by mouth 3 (three) times daily. In three days, increase to 300 mg TID Patient not taking: No sig reported 3/97/67   Delora Fuel, MD  glucose blood test strip See admin instructions. 01/31/20   [provider]  Insulin Pen Needle (PEN NEEDLES) 31G X 6 MM MISC See admin instructions. 01/31/20  [provider]  [facility] VERIO test strip USE AS DIRECTED FOUR TIMES DAILY 08/22/20   Fayrene Helper, MD  oxyCODONE (OXYCONTIN) 10 mg 12 hr tablet Take 1 tablet (10 mg total) by mouth every 12 (twelve) hours. Patient not taking: No sig reported 12/06/20   Earnie Larsson, MD    Allergies    Penicillins, Metformin and related, Wound dressing adhesive, Empagliflozin, Penicillin g, Statins, and Zetia [ezetimibe]  Review of Systems   Review of Systems  Constitutional: Negative for fever.  HENT: Negative for ear pain and sore throat.   Eyes: Negative for visual disturbance.  Respiratory: Negative for cough and shortness of breath.   Cardiovascular: Negative for chest pain.  Gastrointestinal: Positive for abdominal pain, constipation, diarrhea, nausea and vomiting.  Genitourinary: Negative for dysuria and hematuria.  Musculoskeletal: Negative for back pain.  Skin: Negative for rash.  Neurological: Positive for syncope, weakness (generalized) and light-headedness. Negative for headaches.  All other systems reviewed and are negative.   Physical Exam Updated Vital Signs BP (!) 111/53   Pulse 94   Temp 98.3 F (36.8 C) (Oral)   Resp 20   Ht 5\' 5"  (1.651 m)   Wt 91.2 kg   SpO2 97%   BMI 33.45 kg/m   Physical Exam Vitals and nursing note reviewed.  Constitutional:      General: She is not in acute distress.    Appearance: She is well-developed.  HENT:     Head: Normocephalic and atraumatic.     Mouth/Throat:     Mouth: Mucous membranes are dry.  Eyes:     Extraocular Movements: Extraocular movements intact.      Conjunctiva/sclera: Conjunctivae normal.     Pupils: Pupils are equal, round, and reactive to light.  Cardiovascular:     Rate and Rhythm: Normal rate and regular rhythm.     Pulses:          Dorsalis pedis pulses are 2+ on the right side and 2+ on the left side.     Heart sounds: No murmur heard.   Pulmonary:     Effort: Pulmonary effort is normal. No respiratory distress.     Breath sounds: Normal breath sounds.  Abdominal:     General: Bowel sounds are normal.     Palpations: Abdomen is soft.     Tenderness: There is abdominal tenderness (mild llq ttp). There is no guarding or rebound.  Musculoskeletal:        General: No tenderness.     Cervical back: Neck supple.     Right lower leg: No edema.     Left lower leg: No edema.  Skin:    General: Skin is warm and dry.  Neurological:     Mental Status: She is alert.     Comments: Mental Status:  Alert, thought content appropriate, able to give a coherent history. Speech fluent without evidence of aphasia. Able to follow 2 step commands without difficulty.  Cranial Nerves:  II: pupils equal, round, reactive to light III,IV, VI: ptosis not present, extra-ocular motions intact bilaterally  V,VII: smile symmetric, facial light touch sensation equal VIII: hearing grossly normal to voice  X: uvula elevates symmetrically  XI: bilateral shoulder shrug symmetric and strong XII: midline tongue extension without fassiculations Motor:  Normal tone. 5/5 strength of BUE and BLE major muscle groups including strong and equal grip strength and dorsiflexion/plantar flexion Sensory: light touch normal in all extremities.   Psychiatric:  Mood and Affect: Affect is flat.     ED Results / Procedures / Treatments   Labs (all labs ordered are listed, but only abnormal results are displayed) Labs Reviewed  CBC WITH DIFFERENTIAL/PLATELET - Abnormal; Notable for the following components:      Result Value   WBC 12.4 (*)    Hemoglobin  16.0 (*)    HCT 46.6 (*)    Platelets 421 (*)    Lymphs Abs 4.2 (*)    All other components within normal limits  COMPREHENSIVE METABOLIC PANEL - Abnormal; Notable for the following components:   Sodium 134 (*)    Potassium 2.9 (*)    Glucose, Bld 140 (*)    Creatinine, Ser 1.05 (*)    All other components within normal limits  LIPASE, BLOOD - Abnormal; Notable for the following components:   Lipase 86 (*)    All other components within normal limits  D-DIMER, QUANTITATIVE - Abnormal; Notable for the following components:   D-Dimer, Quant 1.26 (*)    All other components within normal limits  C DIFFICILE QUICK SCREEN W PCR REFLEX  MAGNESIUM  TSH  URINALYSIS, ROUTINE W REFLEX MICROSCOPIC  CBG MONITORING, ED  TROPONIN I (HIGH SENSITIVITY)  TROPONIN I (HIGH SENSITIVITY)    EKG EKG Interpretation  Date/Time:  Friday January 16 2021 18:31:31 EDT Ventricular Rate:  90 PR Interval:  156 QRS Duration: 99 QT Interval:  385 QTC Calculation: 472 R Axis:   34 Text Interpretation: Sinus rhythm Confirmed by Davonna Belling [phone]) on 01/16/2021 9:13:38 PM   Radiology CT HEAD WO CONTRAST  Result Date: 01/16/2021 CLINICAL DATA:  Syncope EXAM: CT HEAD WITHOUT CONTRAST TECHNIQUE: Contiguous axial images were obtained from the base of the skull through the vertex without intravenous contrast. COMPARISON:  None. FINDINGS: Brain: No acute intracranial abnormality. Specifically, no hemorrhage, hydrocephalus, mass lesion, acute infarction, or significant intracranial injury. Vascular: No hyperdense vessel or unexpected calcification. Skull: No acute calvarial abnormality. Sinuses/Orbits: No acute findings Other: None IMPRESSION: No acute intracranial abnormality. Electronically Signed   By: Rolm Baptise M.D.   On: 01/16/2021 17:56   CT Angio Chest PE W and/or Wo Contrast  Result Date: 01/16/2021 CLINICAL DATA:  Nonlocalized acute abdominal pain., positive D-dimer with nausea, vomiting,  diarrhea and dehydration. Surgery spine 12/03/2020. EXAM: CT ANGIOGRAPHY CHEST CT ABDOMEN AND PELVIS WITH CONTRAST TECHNIQUE: Multidetector CT imaging of the chest was performed using the standard protocol during bolus administration of intravenous contrast. Multiplanar CT image reconstructions and MIPs were obtained to evaluate the vascular anatomy. Multidetector CT imaging of the abdomen and pelvis was performed using the standard protocol during bolus administration of intravenous contrast. CONTRAST:  152mL OMNIPAQUE IOHEXOL 350 MG/ML SOLN COMPARISON:  CT lumbar spine 09/09/2020, CT chest 03/21/2020, CT abdomen pelvis 12/24/2013 FINDINGS: CTA CHEST FINDINGS Cardiovascular: Satisfactory opacification of the pulmonary arteries to the segmental level. No evidence of pulmonary embolism. The main pulmonary artery is normal in caliber. Normal heart size. No significant pericardial effusion. The thoracic aorta is normal in caliber. Trace atherosclerotic plaque of the thoracic aorta. No coronary artery calcifications. Mediastinum/Nodes: Several prominent but nonenlarged bilateral hilar lymph nodes. Status post left axillary lymph node dissection. No enlarged mediastinal, hilar, or axillary lymph nodes. Status post left thyroidectomy. Otherwise the thyroid gland, trachea, and esophagus demonstrate no significant findings. Lungs/Pleura: No pulmonary nodule. No pulmonary mass. Bilateral lower lobe subsegmental atelectasis. Thin walled cystic lesion within the right upper lobe likely benign etiology. No focal consolidation. No pleural  effusion. No pneumothorax. Musculoskeletal: No chest wall abnormality.  Left mastectomy. No suspicious lytic or blastic osseous lesions. No acute displaced fracture. Review of the MIP images confirms the above findings. CT ABDOMEN and PELVIS FINDINGS Hepatobiliary: No focal liver abnormality. Status post cholecystectomy. No biliary dilatation. Pancreas: No focal lesion. Normal pancreatic  contour. No surrounding inflammatory changes. No main pancreatic ductal dilatation. Spleen: Status post splenectomy. Adrenals/Urinary Tract: Stable 2.2 cm right adrenal gland nodule with a density of 63 Hounsfield units. Interval development of a nother right adrenal gland nodule with a density of 27 Hounsfield units and size of approximately 1.4 cm. Status post left adrenalectomy. Bilateral kidneys enhance symmetrically. Fluid density lesion within the right kidney likely represents a simple renal cyst. Subcentimeter hypodensities are too small to characterize. No hydronephrosis. No hydroureter. The urinary bladder is unremarkable. On delayed imaging, there is no urothelial wall thickening and there are no filling defects in the opacified portions of the bilateral collecting systems or ureters. Stomach/Bowel: Stomach is within normal limits. No evidence of bowel wall thickening or dilatation. Status post appendectomy Vascular/Lymphatic: No abdominal aorta or iliac aneurysm. Trace atherosclerotic plaque of the aorta and its branches. No abdominal, pelvic, or inguinal lymphadenopathy. Reproductive: Status post hysterectomy. No adnexal masses. Other: No intraperitoneal free fluid. No intraperitoneal free gas. No organized fluid collection. Musculoskeletal: No abdominal wall hernia or abnormality. Healed anterior abdominal incisions. No suspicious lytic or blastic osseous lesions. No acute displaced fracture. Multilevel degenerative changes of the spine. Anterior and interbody L4-L5 and L5-S1 fusion with surgical hardware. Review of the MIP images confirms the above findings. IMPRESSION: 1. No pulmonary embolus. 2. Several prominent bilateral hilar lymph nodes with no definite lymphadenopathy. Recommend attention on follow-up. Otherwise no acute intra-abdominal abnormality. 3. Interval development of a 1.4 cm right adrenal gland nodule. Stable other 2.2 cm right adrenal gland nodule. Patient status post left  adrenalectomy. Recommend CT adrenal protocol for further evaluation. 4. Status post left thyroidectomy, left mastectomy, left axillary lymph node dissection, left adrenalectomy, cholecystectomy, splenectomy, appendectomy, hysterectomy. 5.  Aortic Atherosclerosis (ICD10-I70.0) - trace. Electronically Signed   By: Iven Finn M.D.   On: 01/16/2021 21:07   CT ABDOMEN PELVIS W CONTRAST  Result Date: 01/16/2021 CLINICAL DATA:  Nonlocalized acute abdominal pain., positive D-dimer with nausea, vomiting, diarrhea and dehydration. Surgery spine 12/03/2020. EXAM: CT ANGIOGRAPHY CHEST CT ABDOMEN AND PELVIS WITH CONTRAST TECHNIQUE: Multidetector CT imaging of the chest was performed using the standard protocol during bolus administration of intravenous contrast. Multiplanar CT image reconstructions and MIPs were obtained to evaluate the vascular anatomy. Multidetector CT imaging of the abdomen and pelvis was performed using the standard protocol during bolus administration of intravenous contrast. CONTRAST:  182mL OMNIPAQUE IOHEXOL 350 MG/ML SOLN COMPARISON:  CT lumbar spine 09/09/2020, CT chest 03/21/2020, CT abdomen pelvis 12/24/2013 FINDINGS: CTA CHEST FINDINGS Cardiovascular: Satisfactory opacification of the pulmonary arteries to the segmental level. No evidence of pulmonary embolism. The main pulmonary artery is normal in caliber. Normal heart size. No significant pericardial effusion. The thoracic aorta is normal in caliber. Trace atherosclerotic plaque of the thoracic aorta. No coronary artery calcifications. Mediastinum/Nodes: Several prominent but nonenlarged bilateral hilar lymph nodes. Status post left axillary lymph node dissection. No enlarged mediastinal, hilar, or axillary lymph nodes. Status post left thyroidectomy. Otherwise the thyroid gland, trachea, and esophagus demonstrate no significant findings. Lungs/Pleura: No pulmonary nodule. No pulmonary mass. Bilateral lower lobe subsegmental atelectasis.  Thin walled cystic lesion within the right upper lobe likely benign etiology.  No focal consolidation. No pleural effusion. No pneumothorax. Musculoskeletal: No chest wall abnormality.  Left mastectomy. No suspicious lytic or blastic osseous lesions. No acute displaced fracture. Review of the MIP images confirms the above findings. CT ABDOMEN and PELVIS FINDINGS Hepatobiliary: No focal liver abnormality. Status post cholecystectomy. No biliary dilatation. Pancreas: No focal lesion. Normal pancreatic contour. No surrounding inflammatory changes. No main pancreatic ductal dilatation. Spleen: Status post splenectomy. Adrenals/Urinary Tract: Stable 2.2 cm right adrenal gland nodule with a density of 63 Hounsfield units. Interval development of a nother right adrenal gland nodule with a density of 27 Hounsfield units and size of approximately 1.4 cm. Status post left adrenalectomy. Bilateral kidneys enhance symmetrically. Fluid density lesion within the right kidney likely represents a simple renal cyst. Subcentimeter hypodensities are too small to characterize. No hydronephrosis. No hydroureter. The urinary bladder is unremarkable. On delayed imaging, there is no urothelial wall thickening and there are no filling defects in the opacified portions of the bilateral collecting systems or ureters. Stomach/Bowel: Stomach is within normal limits. No evidence of bowel wall thickening or dilatation. Status post appendectomy Vascular/Lymphatic: No abdominal aorta or iliac aneurysm. Trace atherosclerotic plaque of the aorta and its branches. No abdominal, pelvic, or inguinal lymphadenopathy. Reproductive: Status post hysterectomy. No adnexal masses. Other: No intraperitoneal free fluid. No intraperitoneal free gas. No organized fluid collection. Musculoskeletal: No abdominal wall hernia or abnormality. Healed anterior abdominal incisions. No suspicious lytic or blastic osseous lesions. No acute displaced fracture. Multilevel  degenerative changes of the spine. Anterior and interbody L4-L5 and L5-S1 fusion with surgical hardware. Review of the MIP images confirms the above findings. IMPRESSION: 1. No pulmonary embolus. 2. Several prominent bilateral hilar lymph nodes with no definite lymphadenopathy. Recommend attention on follow-up. Otherwise no acute intra-abdominal abnormality. 3. Interval development of a 1.4 cm right adrenal gland nodule. Stable other 2.2 cm right adrenal gland nodule. Patient status post left adrenalectomy. Recommend CT adrenal protocol for further evaluation. 4. Status post left thyroidectomy, left mastectomy, left axillary lymph node dissection, left adrenalectomy, cholecystectomy, splenectomy, appendectomy, hysterectomy. 5.  Aortic Atherosclerosis (ICD10-I70.0) - trace. Electronically Signed   By: Iven Finn M.D.   On: 01/16/2021 21:07    Procedures Procedures   Medications Ordered in ED Medications  potassium chloride (KLOR-CON) packet 40 mEq (40 mEq Oral Given 01/16/21 2016)  sodium chloride 0.9 % bolus 1,000 mL (0 mLs Intravenous Stopped 01/16/21 2139)  ondansetron (ZOFRAN) injection 4 mg (4 mg Intravenous Given 01/16/21 2017)  iohexol (OMNIPAQUE) 350 MG/ML injection 100 mL (100 mLs Intravenous Contrast Given 01/16/21 2035)  sodium chloride 0.9 % bolus 1,000 mL (0 mLs Intravenous Stopped 01/16/21 2236)    ED Course  I have reviewed the triage vital signs and the nursing notes.  Pertinent labs & imaging results that were available during my care of the patient were reviewed by me and considered in my medical decision making (see chart for details).    MDM Rules/Calculators/A&P                          60 y/o F presenting with multiple complaints including increased weakness, syncope, nvd.   Reviewed/interpreted labs CBC with mild leukocytosis, hgb elevated which is likely due to hemoconcentration CMP with mildly elevated creatinine, hypokalemia, and mildly low sodium Lipase is  marginally elevated ddimer ordered due to recent surgery and recurrent syncope and is +, will get cta Trop neg Mg wnl tsh wnl ua pending cdif pending  CT head ordered in triage is wnl CT chest/abd/pelvis - No PE. prominent bilateral hilar lymph nodes.  Otherwise no acute intra-abdominal abnormality. Interval development of a 1.4 cm right adrenal gland nodule. Stable other 2.2 cm right adrenal gland nodule.  Aortic Atherosclerosis   Pt was given IVF and on reassessment she remains stable and has been able to tolerate po.  Reviewed chart. Pt seen last month with similar presentation. At that time she was found to be dehydrated and orthostatic. her syncope was felt to be related to this. Suspect the same today. We had a long discussion about her leg pain causing her to be less mobile and able to participate in her typical activities. She admits this exacerbated her depression and agrees that this may have led to her decreased appetite. She does not have any significant neuro deficits that would warrant emergent neurologic imaging and she does have mri scheduled this week and has upcoming appt with neurosurgery. We discussed initiating low dose steroid, pain meds and gabapentin to help control her pain. I further advised that she will need to f/u with pcp in regards to her depression. She is in agreement with the plan for f/u and appears to be stable for discharge. All questions were answered, pt stable for discharge pending remainder of w/u.   At shift change, care transitioned to Dr. Wyvonnia Dusky who will f/u on pending cdif test and ua and dispo accordingly.   Final Clinical Impression(s) / ED Diagnoses Final diagnoses:  Enlarged lymph nodes  Adrenal nodule (HCC)  Aortic atherosclerosis (HCC)  Decreased appetite  Pain of lower extremity, unspecified laterality    Rx / DC Orders ED Discharge Orders         Ordered    predniSONE (DELTASONE) 10 MG tablet  Daily        01/16/21 2305     oxyCODONE-acetaminophen (PERCOCET/ROXICET) 5-325 MG tablet  Every 8 hours PRN        01/16/21 2305           Rodney Booze, PA-C 01/16/21 2306    Davonna Belling, MD 01/26/21 [phone]

## 2021-01-16 NOTE — ED Notes (Signed)
EDP notified of orthostatic vitals.

## 2021-01-16 NOTE — ED Triage Notes (Signed)
Pt c/o N/V/D with dehydration since her spinal surgery on 3/2.  Seen here for the same on 12/19/20

## 2021-01-16 NOTE — Discharge Instructions (Addendum)
Prescription given for Percocet. Take medication as directed and do not operate machinery, drive a car, or work while taking this medication as it can make you drowsy.   Take prednisone as directed. You will need to follow your blood sugars closely and if they become significantly elevated you will need to talk to your primary care provider or stop the medication.   You can try to resume the prescription that you have of gabapentin   Please follow up with your primary care provider within 5-7 days for re-evaluation of your symptoms. If you do not have a primary care provider, information for a healthcare clinic has been provided for you to make arrangements for follow up care. Please return to the emergency department for any new or worsening symptoms.

## 2021-01-16 NOTE — ED Triage Notes (Signed)
Emergency Medicine Provider Triage Evaluation Note  Annette [name] , a 60 y.o. female  was evaluated in triage.  Pt complains of nausea, vomiting dehydration and recurrent syncope.  Patient states that this has been going since her surgery in March.  States that she has been seen in the emergency department multiple times for similar presentation, however last time since she was here she has had multiple recurrent episodes of syncope.  No focal neurodeficits.  Review of Systems  Positive: Syncope nausea, vomiting Negative: Fevers, head trauma.  Physical Exam  BP 103/60 (BP Location: Right Arm)   Pulse (!) 102   Temp 98.3 F (36.8 C) (Oral)   Resp 17   Ht 5\' 5"  (1.651 m)   Wt 91.2 kg   SpO2 98%   BMI 33.45 kg/m  Gen:   Awake, no distress HEENT:  Atraumatic  Resp:  Normal effort  Cardiac:  Normal rate  Abd:   Nondistended, nontender  MSK:   Moves extremities without difficulty  Neuro:  Speech clear, no facial droop, no aphasia.  Cranial nerves II through XII grossly intact.  Medical Decision Making  Medically screening exam initiated at 5:36 PM.  Appropriate orders placed.  Annette [name] was informed that the remainder of the evaluation will be completed by another provider, this initial triage assessment does not replace that evaluation, and the importance of remaining in the ED until their evaluation is complete.  Clinical Impression  Patient presenting to the emerge department for nausea vomiting diarrhea and dehydration since her spinal surgery in March, also states that she has had recurrent syncope states that its been about 20 times over the past month, worsening over the last week.  No head trauma.   MSE was initiated and I personally evaluated the patient and placed orders (if any) at  5:38 PM on January 16, 2021.  The patient appears stable so that the remainder of the MSE may be completed by another provider.    Annette Client, PA-C 01/16/21 1738

## 2021-01-17 ENCOUNTER — Observation Stay (HOSPITAL_COMMUNITY): Payer: BC Managed Care – PPO

## 2021-01-17 DIAGNOSIS — R52 Pain, unspecified: Secondary | ICD-10-CM | POA: Diagnosis present

## 2021-01-17 DIAGNOSIS — R55 Syncope and collapse: Secondary | ICD-10-CM

## 2021-01-17 DIAGNOSIS — Z794 Long term (current) use of insulin: Secondary | ICD-10-CM

## 2021-01-17 DIAGNOSIS — D72829 Elevated white blood cell count, unspecified: Secondary | ICD-10-CM

## 2021-01-17 DIAGNOSIS — E876 Hypokalemia: Secondary | ICD-10-CM | POA: Diagnosis present

## 2021-01-17 DIAGNOSIS — E1169 Type 2 diabetes mellitus with other specified complication: Secondary | ICD-10-CM

## 2021-01-17 LAB — MAGNESIUM: Magnesium: 1.6 mg/dL — ABNORMAL LOW (ref 1.7–2.4)

## 2021-01-17 LAB — RESP PANEL BY RT-PCR (FLU A&B, COVID) ARPGX2
Influenza A by PCR: NEGATIVE
Influenza B by PCR: NEGATIVE
SARS Coronavirus 2 by RT PCR: NEGATIVE

## 2021-01-17 LAB — CBC WITH DIFFERENTIAL/PLATELET
Abs Immature Granulocytes: 0.03 10*3/uL (ref 0.00–0.07)
Basophils Absolute: 0.1 10*3/uL (ref 0.0–0.1)
Basophils Relative: 1 %
Eosinophils Absolute: 0.2 10*3/uL (ref 0.0–0.5)
Eosinophils Relative: 1 %
HCT: 43.8 % (ref 36.0–46.0)
Hemoglobin: 14.9 g/dL (ref 12.0–15.0)
Immature Granulocytes: 0 %
Lymphocytes Relative: 43 %
Lymphs Abs: 5.5 10*3/uL — ABNORMAL HIGH (ref 0.7–4.0)
MCH: 33.6 pg (ref 26.0–34.0)
MCHC: 34 g/dL (ref 30.0–36.0)
MCV: 98.9 fL (ref 80.0–100.0)
Monocytes Absolute: 1.2 10*3/uL — ABNORMAL HIGH (ref 0.1–1.0)
Monocytes Relative: 9 %
Neutro Abs: 5.8 10*3/uL (ref 1.7–7.7)
Neutrophils Relative %: 46 %
Platelets: 350 10*3/uL (ref 150–400)
RBC: 4.43 MIL/uL (ref 3.87–5.11)
RDW: 13.1 % (ref 11.5–15.5)
WBC: 12.7 10*3/uL — ABNORMAL HIGH (ref 4.0–10.5)
nRBC: 0 % (ref 0.0–0.2)

## 2021-01-17 LAB — COMPREHENSIVE METABOLIC PANEL
ALT: 25 U/L (ref 0–44)
AST: 27 U/L (ref 15–41)
Albumin: 3.6 g/dL (ref 3.5–5.0)
Alkaline Phosphatase: 78 U/L (ref 38–126)
Anion gap: 12 (ref 5–15)
BUN: 11 mg/dL (ref 8–23)
CO2: 21 mmol/L — ABNORMAL LOW (ref 22–32)
Calcium: 8.8 mg/dL — ABNORMAL LOW (ref 8.9–10.3)
Chloride: 105 mmol/L (ref 98–111)
Creatinine, Ser: 0.87 mg/dL (ref 0.44–1.00)
GFR, Estimated: >60 mL/min (ref >60)
Glucose, Bld: 90 mg/dL (ref 70–99)
Potassium: 2.8 mmol/L — ABNORMAL LOW (ref 3.5–5.1)
Sodium: 138 mmol/L (ref 135–145)
Total Bilirubin: 0.5 mg/dL (ref 0.3–1.2)
Total Protein: 7.1 g/dL (ref 6.5–8.1)

## 2021-01-17 LAB — GLUCOSE, CAPILLARY
Glucose-Capillary: 85 mg/dL (ref 70–99)
Glucose-Capillary: 130 mg/dL — ABNORMAL HIGH (ref 70–99)
Glucose-Capillary: 151 mg/dL — ABNORMAL HIGH (ref 70–99)
Glucose-Capillary: 152 mg/dL — ABNORMAL HIGH (ref 70–99)

## 2021-01-17 LAB — ECHOCARDIOGRAM COMPLETE
Area-P 1/2: 4.77 cm2
Height: 65 in
S' Lateral: 2.3 cm
Weight: 3216 oz

## 2021-01-17 LAB — HIV ANTIBODY (ROUTINE TESTING W REFLEX): HIV Screen 4th Generation wRfx: NONREACTIVE

## 2021-01-17 MED ORDER — LOPERAMIDE HCL 2 MG PO CAPS
2.0000 mg | ORAL_CAPSULE | ORAL | Status: DC | PRN
  Administered 2021-01-19: 2 mg via ORAL
  Filled 2021-01-17: qty 1

## 2021-01-17 MED ORDER — VENLAFAXINE HCL ER 75 MG PO CP24
75.0000 mg | ORAL_CAPSULE | Freq: Every day | ORAL | Status: DC
  Administered 2021-01-17 – 2021-01-23 (×7): 75 mg via ORAL
  Filled 2021-01-17 (×7): qty 1

## 2021-01-17 MED ORDER — GABAPENTIN 300 MG PO CAPS
300.0000 mg | ORAL_CAPSULE | Freq: Three times a day (TID) | ORAL | Status: DC
  Administered 2021-01-17 – 2021-01-23 (×19): 300 mg via ORAL
  Filled 2021-01-17 (×20): qty 1

## 2021-01-17 MED ORDER — POTASSIUM CHLORIDE 10 MEQ/100ML IV SOLN
10.0000 meq | INTRAVENOUS | Status: AC
  Administered 2021-01-17 (×3): 10 meq via INTRAVENOUS
  Filled 2021-01-17 (×3): qty 100

## 2021-01-17 MED ORDER — TEMAZEPAM 15 MG PO CAPS: 15.0000 mg | ORAL_CAPSULE | Freq: Every day | ORAL | Status: DC

## 2021-01-17 MED ORDER — TRAZODONE HCL 50 MG PO TABS
100.0000 mg | ORAL_TABLET | Freq: Every day | ORAL | Status: DC
  Administered 2021-01-17 – 2021-01-22 (×6): 100 mg via ORAL
  Filled 2021-01-17 (×6): qty 2

## 2021-01-17 MED ORDER — CYCLOBENZAPRINE HCL 10 MG PO TABS
5.0000 mg | ORAL_TABLET | Freq: Two times a day (BID) | ORAL | Status: DC | PRN
  Administered 2021-01-17 – 2021-01-23 (×6): 5 mg via ORAL
  Filled 2021-01-17 (×7): qty 1

## 2021-01-17 MED ORDER — INSULIN ASPART 100 UNIT/ML ~~LOC~~ SOLN: 0.0000 [IU] | Freq: Every day | SUBCUTANEOUS | Status: DC

## 2021-01-17 MED ORDER — ACETAMINOPHEN 325 MG PO TABS
650.0000 mg | ORAL_TABLET | Freq: Four times a day (QID) | ORAL | Status: DC | PRN
  Administered 2021-01-17: 650 mg via ORAL
  Filled 2021-01-17: qty 2

## 2021-01-17 MED ORDER — OXYCODONE HCL 5 MG PO TABS
5.0000 mg | ORAL_TABLET | ORAL | Status: DC | PRN
  Administered 2021-01-17 – 2021-01-18 (×2): 5 mg via ORAL
  Filled 2021-01-17 (×2): qty 1

## 2021-01-17 MED ORDER — METOPROLOL TARTRATE 25 MG PO TABS
37.5000 mg | ORAL_TABLET | Freq: Two times a day (BID) | ORAL | Status: DC
  Administered 2021-01-17 – 2021-01-20 (×6): 37.5 mg via ORAL
  Filled 2021-01-17 (×8): qty 2

## 2021-01-17 MED ORDER — ONDANSETRON HCL 4 MG/2ML IJ SOLN: 4.0000 mg | Freq: Four times a day (QID) | INTRAMUSCULAR | Status: DC | PRN

## 2021-01-17 MED ORDER — INSULIN DETEMIR 100 UNIT/ML ~~LOC~~ SOLN
35.0000 [IU] | Freq: Every day | SUBCUTANEOUS | Status: DC
  Administered 2021-01-17: 35 [IU] via SUBCUTANEOUS
  Filled 2021-01-17 (×2): qty 0.35

## 2021-01-17 MED ORDER — POLYETHYLENE GLYCOL 3350 17 G PO PACK: 17.0000 g | PACK | Freq: Every day | ORAL | Status: DC | PRN

## 2021-01-17 MED ORDER — SODIUM CHLORIDE 0.9 % IV BOLUS
1000.0000 mL | Freq: Once | INTRAVENOUS | Status: AC
  Administered 2021-01-17: 1000 mL via INTRAVENOUS

## 2021-01-17 MED ORDER — VENLAFAXINE HCL ER 75 MG PO CP24: 75.0000 mg | ORAL_CAPSULE | Freq: Every day | ORAL | Status: DC

## 2021-01-17 MED ORDER — POTASSIUM CHLORIDE 10 MEQ/100ML IV SOLN
10.0000 meq | INTRAVENOUS | Status: AC
  Administered 2021-01-17 (×4): 10 meq via INTRAVENOUS
  Filled 2021-01-17 (×4): qty 100

## 2021-01-17 MED ORDER — MELATONIN 3 MG PO TABS
9.0000 mg | ORAL_TABLET | Freq: Every day | ORAL | Status: DC
  Administered 2021-01-17 – 2021-01-22 (×6): 9 mg via ORAL
  Filled 2021-01-17 (×6): qty 3

## 2021-01-17 MED ORDER — MAGNESIUM OXIDE 400 (241.3 MG) MG PO TABS
800.0000 mg | ORAL_TABLET | Freq: Every day | ORAL | Status: DC
  Administered 2021-01-17 – 2021-01-19 (×3): 800 mg via ORAL
  Filled 2021-01-17 (×3): qty 2

## 2021-01-17 MED ORDER — MECLIZINE HCL 12.5 MG PO TABS
12.5000 mg | ORAL_TABLET | Freq: Three times a day (TID) | ORAL | Status: DC
  Administered 2021-01-17 – 2021-01-23 (×13): 12.5 mg via ORAL
  Filled 2021-01-17 (×15): qty 1

## 2021-01-17 MED ORDER — TEMAZEPAM 15 MG PO CAPS
15.0000 mg | ORAL_CAPSULE | Freq: Every day | ORAL | Status: DC
  Administered 2021-01-17 – 2021-01-22 (×6): 15 mg via ORAL
  Filled 2021-01-17 (×7): qty 1

## 2021-01-17 MED ORDER — MELATONIN 5 MG PO TABS
10.0000 mg | ORAL_TABLET | Freq: Every day | ORAL | Status: DC
  Filled 2021-01-17 (×2): qty 2

## 2021-01-17 MED ORDER — SODIUM CHLORIDE 0.9 % IV SOLN: INTRAVENOUS | Status: DC

## 2021-01-17 MED ORDER — INSULIN ASPART 100 UNIT/ML ~~LOC~~ SOLN
0.0000 [IU] | Freq: Three times a day (TID) | SUBCUTANEOUS | Status: DC
  Administered 2021-01-17: 2 [IU] via SUBCUTANEOUS
  Administered 2021-01-17 – 2021-01-20 (×4): 3 [IU] via SUBCUTANEOUS
  Administered 2021-01-21: 2 [IU] via SUBCUTANEOUS
  Administered 2021-01-23: 3 [IU] via SUBCUTANEOUS

## 2021-01-17 MED ORDER — LORAZEPAM 1 MG PO TABS: 1.0000 mg | ORAL_TABLET | Freq: Every day | ORAL | Status: DC | PRN

## 2021-01-17 MED ORDER — MORPHINE SULFATE (PF) 2 MG/ML IV SOLN
2.0000 mg | INTRAVENOUS | Status: DC | PRN
  Administered 2021-01-17 (×2): 2 mg via INTRAVENOUS
  Filled 2021-01-17 (×2): qty 1

## 2021-01-17 MED ORDER — ACETAMINOPHEN 650 MG RE SUPP: 650.0000 mg | Freq: Four times a day (QID) | RECTAL | Status: DC | PRN

## 2021-01-17 MED ORDER — ROSUVASTATIN CALCIUM 20 MG PO TABS
20.0000 mg | ORAL_TABLET | Freq: Every day | ORAL | Status: DC
  Administered 2021-01-17 – 2021-01-23 (×7): 20 mg via ORAL
  Filled 2021-01-17 (×7): qty 1

## 2021-01-17 MED ORDER — ASPIRIN EC 81 MG PO TBEC
81.0000 mg | DELAYED_RELEASE_TABLET | Freq: Every day | ORAL | Status: DC
  Administered 2021-01-17 – 2021-01-23 (×7): 81 mg via ORAL
  Filled 2021-01-17 (×7): qty 1

## 2021-01-17 MED ORDER — PANTOPRAZOLE SODIUM 40 MG PO TBEC
40.0000 mg | DELAYED_RELEASE_TABLET | Freq: Every day | ORAL | Status: DC
  Administered 2021-01-17 – 2021-01-23 (×7): 40 mg via ORAL
  Filled 2021-01-17 (×7): qty 1

## 2021-01-17 MED ORDER — HEPARIN SODIUM (PORCINE) 5000 UNIT/ML IJ SOLN
5000.0000 [IU] | Freq: Three times a day (TID) | INTRAMUSCULAR | Status: AC
  Administered 2021-01-17 – 2021-01-22 (×16): 5000 [IU] via SUBCUTANEOUS
  Filled 2021-01-17 (×16): qty 1

## 2021-01-17 MED ORDER — SODIUM CHLORIDE 0.9 % IV BOLUS
250.0000 mL | Freq: Once | INTRAVENOUS | Status: AC
  Administered 2021-01-17: 250 mL via INTRAVENOUS

## 2021-01-17 MED ORDER — ONDANSETRON HCL 4 MG PO TABS: 4.0000 mg | ORAL_TABLET | Freq: Four times a day (QID) | ORAL | Status: DC | PRN

## 2021-01-17 MED ORDER — OXYCODONE HCL ER 10 MG PO T12A
10.0000 mg | EXTENDED_RELEASE_TABLET | Freq: Two times a day (BID) | ORAL | Status: DC
  Administered 2021-01-17 – 2021-01-22 (×11): 10 mg via ORAL
  Filled 2021-01-17 (×12): qty 1

## 2021-01-17 NOTE — ED Provider Notes (Signed)
Care assumed from couture PA-C and Dr. Alvino Chapel.  Patient here with recurrent episodes of near syncope, lightheadedness, vomiting and diarrhea.  Ongoing back and leg pain since her surgery in March.  Has had decreased p.o. intake at home and intermittent vomiting and diarrhea for the past 1 month.  Work-up today shows orthostasis and hypokalemia. Imaging was negative for pulmonary embolism.  No acute intra-abdominal pathology.  Was found to have new adrenal nodule.  Suspect that her near syncope is coming from orthostasis.  Her urinalysis is negative for infection and her C. difficile study is negative. No ketones in urine.  Will recheck orthostatics given her significant orthostasis earlier.   Remained significantly orthostatic with blood pressure of 77 with standing heart rate of 120. Additional IV fluids given.  She has minimal dizziness or lightheadedness but is able to stand and ambulate.  Husband at bedside recounts several episodes of near syncope where he had to catch the patient from falling. Given her significant orthostasis and hypotension with standing.  She is agreeable to observation admission overnight for IV fluids and potassium supplementation.  Discussed with Dr. Clearence Ped.    Ezequiel Essex, MD 01/17/21 [phone]

## 2021-01-17 NOTE — Progress Notes (Addendum)
PROGRESS NOTE  Annette [name] DDU:[id number] DOB: [birth year] DOA: 01/16/2021 PCP: Fayrene Helper, MD  HPI/Recap of past 24 hours: Patient admitted with chronic diarrhea since March 2022, she presented to the ED with dizziness and syncope Patient seen and examined at bedside she stated she still having diarrhea she feels quite tired Her potassium was noted to be 2.8 despite 4 rounds of potassium.  Her magnesium is also slightly low . This afternoon she was noted to be orthostatic hypotensive  Assessment/Plan: Principal Problem:   Syncope Active Problems:   Type 2 diabetes mellitus with other specified complication (HCC)   Hypokalemia   Intractable pain   Leukocytosis  #1 syncope with orthostatic hypotension patient will be given another fluid bolus  #2 persistent diarrhea.  Her C. difficile was negative I will start her on Imodium  3.  Hypokalemia persistent likely secondary to diarrhea her magnesium was also low she has received 4 rounds of potassium I will add another IV potassium x3 rounds we will monitor her electrolytes I will replace her magnesium.  4.  Hypomagnesemia I will start her magnesium replacement  5.  Type 2 diabetes mellitus fairly controlled.  Continue Lantus and sliding scale  6.  Leukocytosis.  Reactive secondary to diarrhea may be  Code Status: FULL  Severity of Illness: The appropriate patient status for this patient is OBSERVATION. Observation status is judged to be reasonable and necessary in order to provide the required intensity of service to ensure the patient's safety. The patient's presenting symptoms, physical exam findings, and initial radiographic and laboratory data in the context of their medical condition is felt to place them at decreased risk for further clinical deterioration. Furthermore, it is anticipated that the patient will be medically stable for discharge from the hospital within 2 midnights of admission. The following factors  support the patient status of observation.   " Patient is experiencing orthostatic hypotension and she is still having diarrhea and her potassium still low.  Family Communication: PATIENT  * Disposition Plan: HOME WHEN STABLE . BP AND POTASSIUM ARE LOW   Consultants:  NONE  Procedures:  NONE  Antimicrobials:  NONE  DVT prophylaxis:  SQ HEPARINE   Objective: Vitals:   01/17/21 0146 01/17/21 0218 01/17/21 0555 01/17/21 1001  BP: (!) 119/59 (!) 105/51 (!) 109/55 (!) 97/46  Pulse: 91 99 90 79  Resp: 17 20 18 14   Temp: 98 F (36.7 C) 98.1 F (36.7 C) 98 F (36.7 C) 98.3 F (36.8 C)  TempSrc: Oral  Oral Oral  SpO2: 97% 100% 99% 99%  Weight:      Height:        Intake/Output Summary (Last 24 hours) at 01/17/2021 1359 Last data filed at 01/17/2021 0914 Gross per 24 hour  Intake 1741.6 ml  Output --  Net 1741.6 ml   Filed Weights   01/16/21 1724  Weight: 91.2 kg   Body mass index is 33.45 kg/m.  Exam:  . General: 59 y.o. year-old female well developed well nourished in no acute distress.  Alert and oriented x3. . Cardiovascular: Regular rate and rhythm with no rubs or gallops.  No thyromegaly or JVD noted.   Marland Kitchen Respiratory: Clear to auscultation with no wheezes or rales. Good inspiratory effort. . Abdomen: Soft nontender nondistended with normal bowel sounds x4 quadrants. . Musculoskeletal: No lower extremity edema. 2/4 pulses in all 4 extremities. . Skin: No ulcerative lesions noted or rashes, . Psychiatry: Mood is appropriate for condition and  setting    Data Reviewed: CBC: Recent Labs  Lab 01/16/21 1816 01/17/21 0510  WBC 12.4* 12.7*  NEUTROABS 7.0 5.8  HGB 16.0* 14.9  HCT 46.6* 43.8  MCV 96.9 98.9  PLT 421* 194   Basic Metabolic Panel: Recent Labs  Lab 01/16/21 1816 01/17/21 0510  NA 134* 138  K 2.9* 2.8*  CL 98 105  CO2 22 21*  GLUCOSE 140* 90  BUN 16 11  CREATININE 1.05* 0.87  CALCIUM 10.0 8.8*  MG 1.9 1.6*   GFR: Estimated  Creatinine Clearance: 75.8 mL/min (by C-G formula based on SCr of 0.87 mg/dL). Liver Function Tests: Recent Labs  Lab 01/16/21 1816 01/17/21 0510  AST 34 27  ALT 29 25  ALKPHOS 89 78  BILITOT 0.7 0.5  PROT 8.0 7.1  ALBUMIN 4.1 3.6   Recent Labs  Lab 01/16/21 1816  LIPASE 86*   No results for input(s): AMMONIA in the last 168 hours. Coagulation Profile: No results for input(s): INR, PROTIME in the last 168 hours. Cardiac Enzymes: No results for input(s): CKTOTAL, CKMB, CKMBINDEX, TROPONINI in the last 168 hours. BNP (last 3 results) No results for input(s): PROBNP in the last 8760 hours. HbA1C: No results for input(s): HGBA1C in the last 72 hours. CBG: Recent Labs  Lab 01/17/21 0730 01/17/21 1108  GLUCAP 85 130*   Lipid Profile: No results for input(s): CHOL, HDL, LDLCALC, TRIG, CHOLHDL, LDLDIRECT in the last 72 hours. Thyroid Function Tests: Recent Labs    01/16/21 1816  TSH 1.835   Anemia Panel: No results for input(s): VITAMINB12, FOLATE, FERRITIN, TIBC, IRON, RETICCTPCT in the last 72 hours. Urine analysis:    Component Value Date/Time   COLORURINE STRAW (A) 01/16/2021 2240   APPEARANCEUR CLEAR 01/16/2021 2240   LABSPEC 1.042 (H) 01/16/2021 2240   PHURINE 7.0 01/16/2021 2240   GLUCOSEU NEGATIVE 01/16/2021 2240   HGBUR SMALL (A) 01/16/2021 2240   BILIRUBINUR NEGATIVE 01/16/2021 2240   BILIRUBINUR neg 01/20/2012 1128   KETONESUR NEGATIVE 01/16/2021 2240   PROTEINUR NEGATIVE 01/16/2021 2240   UROBILINOGEN 0.2 12/23/2013 2210   NITRITE NEGATIVE 01/16/2021 2240   LEUKOCYTESUR NEGATIVE 01/16/2021 2240   Sepsis Labs: @LABRCNTIP (procalcitonin:4,lacticidven:4)  ) Recent Results (from the past 240 hour(s))  C Difficile Quick Screen w PCR reflex     Status: None   Collection Time: 01/16/21 10:40 PM   Specimen: STOOL  Result Value Ref Range Status   C Diff antigen NEGATIVE NEGATIVE Final   C Diff toxin NEGATIVE NEGATIVE Final   C Diff interpretation No  C. difficile detected.  Final    Comment: Performed at [facility], [address], [place], [place] [address]  Resp Panel by RT-PCR (Flu A&B, Covid) Nasopharyngeal Swab     Status: None   Collection Time: 01/17/21 12:50 AM   Specimen: Nasopharyngeal Swab; Nasopharyngeal(NP) swabs in vial transport medium  Result Value Ref Range Status   SARS Coronavirus 2 by RT PCR NEGATIVE NEGATIVE Final    Comment: (NOTE) SARS-CoV-2 target nucleic acids are NOT DETECTED.  The SARS-CoV-2 RNA is generally detectable in upper respiratory specimens during the acute phase of infection. The lowest concentration of SARS-CoV-2 viral copies this assay can detect is 138 copies/mL. A negative result does not preclude SARS-Cov-2 infection and should not be used as the sole basis for treatment or other patient management decisions. A negative result may occur with  improper specimen collection/handling, submission of specimen other than nasopharyngeal swab, presence of viral mutation(s) within the areas  targeted by this assay, and inadequate number of viral copies(<138 copies/mL). A negative result must be combined with clinical observations, patient history, and epidemiological information. The expected result is Negative.  Fact Sheet for Patients:  EntrepreneurPulse.com.au  Fact Sheet for Healthcare Providers:  IncredibleEmployment.be  This test is no t yet approved or cleared by the [place] FDA and  has been authorized for detection and/or diagnosis of SARS-CoV-2 by FDA under an Emergency Use Authorization (EUA). This EUA will remain  in effect (meaning this test can be used) for the duration of the COVID-19 declaration under Section 564(b)(1) of the Act, 21 U.S.C.section 360bbb-3(b)(1), unless the authorization is terminated  or revoked sooner.       Influenza A by PCR NEGATIVE NEGATIVE Final   Influenza B by PCR NEGATIVE NEGATIVE Final    Comment:  (NOTE) The Xpert Xpress SARS-CoV-2/FLU/RSV plus assay is intended as an aid in the diagnosis of influenza from Nasopharyngeal swab specimens and should not be used as a sole basis for treatment. Nasal washings and aspirates are unacceptable for Xpert Xpress SARS-CoV-2/FLU/RSV testing.  Fact Sheet for Patients: EntrepreneurPulse.com.au  Fact Sheet for Healthcare Providers: IncredibleEmployment.be  This test is not yet approved or cleared by the [place] FDA and has been authorized for detection and/or diagnosis of SARS-CoV-2 by FDA under an Emergency Use Authorization (EUA). This EUA will remain in effect (meaning this test can be used) for the duration of the COVID-19 declaration under Section 564(b)(1) of the Act, 21 U.S.C. section 360bbb-3(b)(1), unless the authorization is terminated or revoked.  Performed at [facility], [address] [place], [place] [address]       Studies: CT HEAD WO CONTRAST  Result Date: 01/16/2021 CLINICAL DATA:  Syncope EXAM: CT HEAD WITHOUT CONTRAST TECHNIQUE: Contiguous axial images were obtained from the base of the skull through the vertex without intravenous contrast. COMPARISON:  None. FINDINGS: Brain: No acute intracranial abnormality. Specifically, no hemorrhage, hydrocephalus, mass lesion, acute infarction, or significant intracranial injury. Vascular: No hyperdense vessel or unexpected calcification. Skull: No acute calvarial abnormality. Sinuses/Orbits: No acute findings Other: None IMPRESSION: No acute intracranial abnormality. Electronically Signed   By: Rolm Baptise M.D.   On: 01/16/2021 17:56   CT Angio Chest PE W and/or Wo Contrast  Result Date: 01/16/2021 CLINICAL DATA:  Nonlocalized acute abdominal pain., positive D-dimer with nausea, vomiting, diarrhea and dehydration. Surgery spine 12/03/2020. EXAM: CT ANGIOGRAPHY CHEST CT ABDOMEN AND PELVIS WITH CONTRAST TECHNIQUE: Multidetector CT imaging of  the chest was performed using the standard protocol during bolus administration of intravenous contrast. Multiplanar CT image reconstructions and MIPs were obtained to evaluate the vascular anatomy. Multidetector CT imaging of the abdomen and pelvis was performed using the standard protocol during bolus administration of intravenous contrast. CONTRAST:  131mL OMNIPAQUE IOHEXOL 350 MG/ML SOLN COMPARISON:  CT lumbar spine 09/09/2020, CT chest 03/21/2020, CT abdomen pelvis 12/24/2013 FINDINGS: CTA CHEST FINDINGS Cardiovascular: Satisfactory opacification of the pulmonary arteries to the segmental level. No evidence of pulmonary embolism. The main pulmonary artery is normal in caliber. Normal heart size. No significant pericardial effusion. The thoracic aorta is normal in caliber. Trace atherosclerotic plaque of the thoracic aorta. No coronary artery calcifications. Mediastinum/Nodes: Several prominent but nonenlarged bilateral hilar lymph nodes. Status post left axillary lymph node dissection. No enlarged mediastinal, hilar, or axillary lymph nodes. Status post left thyroidectomy. Otherwise the thyroid gland, trachea, and esophagus demonstrate no significant findings. Lungs/Pleura: No pulmonary nodule. No pulmonary mass. Bilateral lower lobe subsegmental atelectasis.  Thin walled cystic lesion within the right upper lobe likely benign etiology. No focal consolidation. No pleural effusion. No pneumothorax. Musculoskeletal: No chest wall abnormality.  Left mastectomy. No suspicious lytic or blastic osseous lesions. No acute displaced fracture. Review of the MIP images confirms the above findings. CT ABDOMEN and PELVIS FINDINGS Hepatobiliary: No focal liver abnormality. Status post cholecystectomy. No biliary dilatation. Pancreas: No focal lesion. Normal pancreatic contour. No surrounding inflammatory changes. No main pancreatic ductal dilatation. Spleen: Status post splenectomy. Adrenals/Urinary Tract: Stable 2.2 cm right  adrenal gland nodule with a density of 63 Hounsfield units. Interval development of a nother right adrenal gland nodule with a density of 27 Hounsfield units and size of approximately 1.4 cm. Status post left adrenalectomy. Bilateral kidneys enhance symmetrically. Fluid density lesion within the right kidney likely represents a simple renal cyst. Subcentimeter hypodensities are too small to characterize. No hydronephrosis. No hydroureter. The urinary bladder is unremarkable. On delayed imaging, there is no urothelial wall thickening and there are no filling defects in the opacified portions of the bilateral collecting systems or ureters. Stomach/Bowel: Stomach is within normal limits. No evidence of bowel wall thickening or dilatation. Status post appendectomy Vascular/Lymphatic: No abdominal aorta or iliac aneurysm. Trace atherosclerotic plaque of the aorta and its branches. No abdominal, pelvic, or inguinal lymphadenopathy. Reproductive: Status post hysterectomy. No adnexal masses. Other: No intraperitoneal free fluid. No intraperitoneal free gas. No organized fluid collection. Musculoskeletal: No abdominal wall hernia or abnormality. Healed anterior abdominal incisions. No suspicious lytic or blastic osseous lesions. No acute displaced fracture. Multilevel degenerative changes of the spine. Anterior and interbody L4-L5 and L5-S1 fusion with surgical hardware. Review of the MIP images confirms the above findings. IMPRESSION: 1. No pulmonary embolus. 2. Several prominent bilateral hilar lymph nodes with no definite lymphadenopathy. Recommend attention on follow-up. Otherwise no acute intra-abdominal abnormality. 3. Interval development of a 1.4 cm right adrenal gland nodule. Stable other 2.2 cm right adrenal gland nodule. Patient status post left adrenalectomy. Recommend CT adrenal protocol for further evaluation. 4. Status post left thyroidectomy, left mastectomy, left axillary lymph node dissection, left  adrenalectomy, cholecystectomy, splenectomy, appendectomy, hysterectomy. 5.  Aortic Atherosclerosis (ICD10-I70.0) - trace. Electronically Signed   By: Iven Finn M.D.   On: 01/16/2021 21:07   CT ABDOMEN PELVIS W CONTRAST  Result Date: 01/16/2021 CLINICAL DATA:  Nonlocalized acute abdominal pain., positive D-dimer with nausea, vomiting, diarrhea and dehydration. Surgery spine 12/03/2020. EXAM: CT ANGIOGRAPHY CHEST CT ABDOMEN AND PELVIS WITH CONTRAST TECHNIQUE: Multidetector CT imaging of the chest was performed using the standard protocol during bolus administration of intravenous contrast. Multiplanar CT image reconstructions and MIPs were obtained to evaluate the vascular anatomy. Multidetector CT imaging of the abdomen and pelvis was performed using the standard protocol during bolus administration of intravenous contrast. CONTRAST:  154mL OMNIPAQUE IOHEXOL 350 MG/ML SOLN COMPARISON:  CT lumbar spine 09/09/2020, CT chest 03/21/2020, CT abdomen pelvis 12/24/2013 FINDINGS: CTA CHEST FINDINGS Cardiovascular: Satisfactory opacification of the pulmonary arteries to the segmental level. No evidence of pulmonary embolism. The main pulmonary artery is normal in caliber. Normal heart size. No significant pericardial effusion. The thoracic aorta is normal in caliber. Trace atherosclerotic plaque of the thoracic aorta. No coronary artery calcifications. Mediastinum/Nodes: Several prominent but nonenlarged bilateral hilar lymph nodes. Status post left axillary lymph node dissection. No enlarged mediastinal, hilar, or axillary lymph nodes. Status post left thyroidectomy. Otherwise the thyroid gland, trachea, and esophagus demonstrate no significant findings. Lungs/Pleura: No pulmonary nodule. No pulmonary mass. Bilateral  lower lobe subsegmental atelectasis. Thin walled cystic lesion within the right upper lobe likely benign etiology. No focal consolidation. No pleural effusion. No pneumothorax. Musculoskeletal: No  chest wall abnormality.  Left mastectomy. No suspicious lytic or blastic osseous lesions. No acute displaced fracture. Review of the MIP images confirms the above findings. CT ABDOMEN and PELVIS FINDINGS Hepatobiliary: No focal liver abnormality. Status post cholecystectomy. No biliary dilatation. Pancreas: No focal lesion. Normal pancreatic contour. No surrounding inflammatory changes. No main pancreatic ductal dilatation. Spleen: Status post splenectomy. Adrenals/Urinary Tract: Stable 2.2 cm right adrenal gland nodule with a density of 63 Hounsfield units. Interval development of a nother right adrenal gland nodule with a density of 27 Hounsfield units and size of approximately 1.4 cm. Status post left adrenalectomy. Bilateral kidneys enhance symmetrically. Fluid density lesion within the right kidney likely represents a simple renal cyst. Subcentimeter hypodensities are too small to characterize. No hydronephrosis. No hydroureter. The urinary bladder is unremarkable. On delayed imaging, there is no urothelial wall thickening and there are no filling defects in the opacified portions of the bilateral collecting systems or ureters. Stomach/Bowel: Stomach is within normal limits. No evidence of bowel wall thickening or dilatation. Status post appendectomy Vascular/Lymphatic: No abdominal aorta or iliac aneurysm. Trace atherosclerotic plaque of the aorta and its branches. No abdominal, pelvic, or inguinal lymphadenopathy. Reproductive: Status post hysterectomy. No adnexal masses. Other: No intraperitoneal free fluid. No intraperitoneal free gas. No organized fluid collection. Musculoskeletal: No abdominal wall hernia or abnormality. Healed anterior abdominal incisions. No suspicious lytic or blastic osseous lesions. No acute displaced fracture. Multilevel degenerative changes of the spine. Anterior and interbody L4-L5 and L5-S1 fusion with surgical hardware. Review of the MIP images confirms the above findings.  IMPRESSION: 1. No pulmonary embolus. 2. Several prominent bilateral hilar lymph nodes with no definite lymphadenopathy. Recommend attention on follow-up. Otherwise no acute intra-abdominal abnormality. 3. Interval development of a 1.4 cm right adrenal gland nodule. Stable other 2.2 cm right adrenal gland nodule. Patient status post left adrenalectomy. Recommend CT adrenal protocol for further evaluation. 4. Status post left thyroidectomy, left mastectomy, left axillary lymph node dissection, left adrenalectomy, cholecystectomy, splenectomy, appendectomy, hysterectomy. 5.  Aortic Atherosclerosis (ICD10-I70.0) - trace. Electronically Signed   By: Iven Finn M.D.   On: 01/16/2021 21:07   US Carotid Bilateral  Result Date: 01/17/2021 CLINICAL DATA:  59 year old with syncope. EXAM: BILATERAL CAROTID DUPLEX ULTRASOUND TECHNIQUE: Pearline Cables scale imaging, color Doppler and duplex ultrasound were performed of bilateral carotid and vertebral arteries in the neck. COMPARISON:  03/26/2019 FINDINGS: Criteria: Quantification of carotid stenosis is based on velocity parameters that correlate the residual internal carotid diameter with NASCET-based stenosis levels, using the diameter of the distal internal carotid lumen as the denominator for stenosis measurement. The following velocity measurements were obtained: RIGHT ICA: 85/19 cm/sec CCA: 00/93 cm/sec SYSTOLIC ICA/CCA RATIO:  0.9 ECA: 95 cm/sec LEFT ICA: 58/18 cm/sec CCA: 81/82 cm/sec SYSTOLIC ICA/CCA RATIO:  0.6 ECA: 75 cm/sec RIGHT CAROTID ARTERY: Right carotid arteries are patent without significant plaque or stenosis. Normal waveforms and velocities in the internal carotid artery. External carotid artery is patent with normal waveform. RIGHT VERTEBRAL ARTERY: Antegrade flow and normal waveform in the right vertebral artery. LEFT CAROTID ARTERY: Small amount of echogenic plaque at the left carotid bulb. Small amount of heterogeneous plaque near the proximal external  carotid artery. External carotid artery is patent with normal waveform. Normal waveforms and velocities in the internal carotid artery. LEFT VERTEBRAL ARTERY: Antegrade flow and normal  waveform in the left vertebral artery. IMPRESSION: 1. Small amount of carotid artery atherosclerosis. Estimated degree of stenosis in the internal carotid arteries is less than 50% bilaterally. 2. Patent vertebral arteries with antegrade flow. Electronically Signed   By: Markus Daft M.D.   On: 01/17/2021 11:20   ECHOCARDIOGRAM COMPLETE  Result Date: 01/17/2021    ECHOCARDIOGRAM REPORT   Patient Name:   Annette [name] Date of Exam: 01/17/2021 Medical Rec #:  [id number]         Height:       65.0 in Accession #:    [id number]        Weight:       201.0 lb Date of Birth:  August 10, 1961        BSA:          1.982 m Patient Age:    39 years          BP:           109/55 mmHg Patient Gender: F                 HR:           90 bpm. Exam Location:  [facility] Procedure: 2D Echo, Cardiac Doppler and Color Doppler Indications:    Syncope R55  History:        Patient has prior history of Echocardiogram examinations, most                 recent 06/18/2019. Risk Factors:Diabetes. Sleep apnea.                 Palpitations.                 Hypothyroidism.  Sonographer:    BW Referring Phys: [id number] ASIA B [place]  1. Left ventricular ejection fraction, by estimation, is 60 to 65%. The left ventricle has normal function. The left ventricle has no regional wall motion abnormalities. Left ventricular diastolic parameters were normal.  2. Right ventricular systolic function is normal. The right ventricular size is normal. Tricuspid regurgitation signal is inadequate for assessing PA pressure.  3. The mitral valve is normal in structure. Trivial mitral valve regurgitation. No evidence of mitral stenosis.  4. The aortic valve is normal in structure. Aortic valve regurgitation is not visualized. No aortic stenosis is present.  5. The  inferior vena cava is normal in size with greater than 50% respiratory variability, suggesting right atrial pressure of 3 mmHg. FINDINGS  Left Ventricle: Left ventricular ejection fraction, by estimation, is 60 to 65%. The left ventricle has normal function. The left ventricle has no regional wall motion abnormalities. The left ventricular internal cavity size was normal in size. There is  no left ventricular hypertrophy. Left ventricular diastolic parameters were normal. Right Ventricle: The right ventricular size is normal. No increase in right ventricular wall thickness. Right ventricular systolic function is normal. Tricuspid regurgitation signal is inadequate for assessing PA pressure. Left Atrium: Left atrial size was normal in size. Right Atrium: Right atrial size was normal in size. Pericardium: There is no evidence of pericardial effusion. Mitral Valve: The mitral valve is normal in structure. Trivial mitral valve regurgitation. No evidence of mitral valve stenosis. Tricuspid Valve: The tricuspid valve is normal in structure. Tricuspid valve regurgitation is trivial. No evidence of tricuspid stenosis. Aortic Valve: The aortic valve is normal in structure. Aortic valve regurgitation is not visualized. No aortic stenosis is present. Pulmonic Valve: The pulmonic valve was normal  in structure. Pulmonic valve regurgitation is not visualized. No evidence of pulmonic stenosis. Aorta: The aortic root is normal in size and structure. Venous: The inferior vena cava is normal in size with greater than 50% respiratory variability, suggesting right atrial pressure of 3 mmHg. IAS/Shunts: No atrial level shunt detected by color flow Doppler.  LEFT VENTRICLE PLAX 2D LVIDd:         3.90 cm  Diastology LVIDs:         2.30 cm  LV e' medial:    10.90 cm/s LV PW:         0.80 cm  LV E/e' medial:  9.1 LV IVS:        0.90 cm  LV e' lateral:   12.00 cm/s LVOT diam:     1.80 cm  LV E/e' lateral: 8.2 LV SV:         60 LV SV Index:    30 LVOT Area:     2.54 cm  RIGHT VENTRICLE RV S prime:     13.60 cm/s TAPSE (M-mode): 2.4 cm LEFT ATRIUM             Index       RIGHT ATRIUM           Index LA diam:        3.40 cm 1.72 cm/m  RA Area:     11.80 cm LA Vol (A2C):   38.8 ml 19.57 ml/m RA Volume:   27.30 ml  13.77 ml/m LA Vol (A4C):   36.2 ml 18.26 ml/m LA Biplane Vol: 37.5 ml 18.92 ml/m  AORTIC VALVE LVOT Vmax:   110.00 cm/s LVOT Vmean:  66.200 cm/s LVOT VTI:    0.234 m  AORTA Ao Root diam: 2.90 cm MITRAL VALVE MV Area (PHT): 4.77 cm    SHUNTS MV Decel Time: 159 msec    Systemic VTI:  0.23 m MV E velocity: 99.00 cm/s  Systemic Diam: 1.80 cm MV A velocity: 71.60 cm/s MV E/A ratio:  1.38 Cherlynn Kaiser MD Electronically signed by Cherlynn Kaiser MD Signature Date/Time: 01/17/2021/1:34:39 PM    Final     Scheduled Meds: . aspirin EC  81 mg Oral Daily  . gabapentin  300 mg Oral TID  . heparin  5,000 Units Subcutaneous Q8H  . insulin aspart  0-15 Units Subcutaneous TID WC  . insulin aspart  0-5 Units Subcutaneous QHS  . insulin detemir  35 Units Subcutaneous QHS  . magnesium oxide  800 mg Oral Daily  . meclizine  12.5 mg Oral TID  . melatonin  9 mg Oral QHS  . metoprolol tartrate  37.5 mg Oral BID  . oxyCODONE  10 mg Oral Q12H  . pantoprazole  40 mg Oral Daily  . rosuvastatin  20 mg Oral Daily  . temazepam  15 mg Oral QHS  . traZODone  100 mg Oral QHS  . venlafaxine XR  75 mg Oral Q breakfast    Continuous Infusions: . sodium chloride 75 mL/hr at 01/17/21 0914  . sodium chloride       LOS: 0 days     Cristal Deer, MD [facility]  To reach me or the doctor on call, go to: [email or web address] Password [facility]  01/17/2021, 1:59 PM

## 2021-01-17 NOTE — ED Notes (Signed)
Pt. Ambulated to the RR well. Pt. Stated she did not feel dizzy.

## 2021-01-17 NOTE — Evaluation (Addendum)
Physical Therapy Evaluation Patient Details Name: Annette [name] MRN: [id number] DOB: 1961-04-26 Today's Date: 01/17/2021   History of Present Illness  Annette [name]  is a 60 y.o. female, with history of sleep apnea, pituitary insufficiency, obesity, thyroid disease, GERD, diabetes mellitus type 2, recent lumbar surgery, and more presents the ED with a chief complaint of leg pain.  Patient reports that she had a lumbar surgery on March 2 and her legs have hurt ever since then.  She reports this is her fourth visit to the ED for the same problem.  She has dizziness, syncope as well.  She reports that she has not been eating since her surgery because she has had no appetite.  She reports that she knew her potassium and her fluid status were low because they were the last time she was in the ED, but she reports she left AMA.  Patient describes her pain as occurring from the hips down to the top of her feet.  It is a full leg ache and not a shooting type of pain.  She reports that she is called her surgeon several times, but has not been put on any pain medication so she was taking her friend's OxyContin 15 mg.  She reports that her pain is a 9 out of 10, and it improves to a 2 out of 10 when she takes the OxyContin.  Walking around does not make the pain better or worse.  Patient reports that she has no muscle cramps.  Patient reports that she is gone downhill since her surgery March 2.  Again she reports she has not been eating, but drinking very little water.  She reports her last normal bowel movement was before the surgery and that she has had diarrhea 3-6 times per day since then.  There is no blood or mucus in the stool.  She reports that she has had a C. difficile test.  She has not been on any recent antibiotics.  Patient describes 3 episodes of syncope since her surgery.  The first 1 was upon standing.  The second 1 was when she was already exerting herself, husband caught her as she collapsed, and  she reports tremors associated with that episode.  The third 1 was today when she was trying to get out of the car she collapsed again.  She denies any trauma/injuries during these episodes of syncope because her husband has caught her and lowered her to the ground safely.   Clinical Impression    Patient sitting up in bed at start of session and receptive to physical therapy. Patient able to perform all bed mobilities with independence and had good standing balance where she was able to reach over bed to pick up purse. Patient functioning at baseline with use of RW. No deviations from projected path, loss of balance or dizziness reported during ambulation today. Patient declined attempting steps as her hips started to hurt while walking. Patient left in chair with call bell within reach. Patient declined outpatient PT for treatment of back secondary to recent lumbar surgery but placed recommendation as patient would greatly benefit from this given pain presentation and lack of overall mobility since lumbar surgery. No skilled PT needs needed while in hospital as patient is functioning at baseline. Patient has met all physical therapy goals at this time and is discharged from physical therapy to care of nursing for ambulation daily as tolerated for length of stay.     Follow Up Recommendations Outpatient PT  Equipment Recommendations  None recommended by PT    Recommendations for Other Services       Precautions / Restrictions Restrictions Weight Bearing Restrictions: No      Mobility  Bed Mobility Overal bed mobility: Independent                  Transfers Overall transfer level: Independent                  Ambulation/Gait Ambulation/Gait assistance: Modified independent (Device/Increase time) Gait Distance (Feet): 200 Feet Assistive device: Rolling walker (2 wheeled) Gait Pattern/deviations: Decreased stride length Gait velocity: decreased   General Gait  Details: decreased gait velocity but no loss of balance, dizziness, knee weakness or deviation noted from projected path during ambulation  Stairs            Wheelchair Mobility    Modified Rankin (Stroke Patients Only)       Balance Overall balance assessment: Independent                                           Pertinent Vitals/Pain Pain Assessment: 0-10 Pain Score: 6  Pain Location: legs Pain Descriptors / Indicators: Constant Pain Intervention(s): Monitored during session    Home Living Family/patient expects to be discharged to:: Private residence Living Arrangements: Spouse/significant other Available Help at Discharge: Family Type of Home: House Home Access: Stairs to enter Entrance Stairs-Rails: Right Entrance Stairs-Number of Steps: 2   also one step from kitchen to dining room Home Layout: One level Home Equipment: Cane - single point;Shower seat;Walker - 2 wheels      Prior Function Level of Independence: Independent with assistive device(s)         Comments: States she uses a RW at baseline since surgery     Hand Dominance        Extremity/Trunk Assessment   Upper Extremity Assessment Upper Extremity Assessment: Generalized weakness            Communication   Communication: No difficulties  Cognition Arousal/Alertness: Awake/alert Behavior During Therapy: WFL for tasks assessed/performed                                          General Comments      Exercises General Exercises - Lower Extremity Long Arc Quad: Strengthening;Both;10 reps Hip Flexion/Marching: Strengthening;Both;10 reps Toe Raises: Strengthening;Both;10 reps Heel Raises: Strengthening;Both;10 reps   Assessment/Plan    PT Assessment Patent does not need any further PT services  PT Problem List         PT Treatment Interventions      PT Goals (Current goals can be found in the Care Plan section)  Acute Rehab PT  Goals Patient Stated Goal: to go home and to have less pain PT Goal Formulation: With patient Time For Goal Achievement: 01/31/21 Potential to Achieve Goals: Good    Frequency     Barriers to discharge        Co-evaluation               AM-PAC PT "6 Clicks" Mobility  Outcome Measure Help needed turning from your back to your side while in a flat bed without using bedrails?: None Help needed moving from lying on your back to sitting on the side  of a flat bed without using bedrails?: None Help needed moving to and from a bed to a chair (including a wheelchair)?: None Help needed standing up from a chair using your arms (e.g., wheelchair or bedside chair)?: None Help needed to walk in hospital room?: None Help needed climbing 3-5 steps with a railing? : A Little 6 Click Score: 23    End of Session Equipment Utilized During Treatment: Gait belt Activity Tolerance: Patient tolerated treatment well;No increased pain Patient left: in chair;with call bell/phone within reach Nurse Communication: Mobility status PT Visit Diagnosis: Unsteadiness on feet (R26.81);History of falling (Z91.81);Muscle weakness (generalized) (M62.81);Pain Pain - Right/Left: Right (both legs) Pain - part of body: Leg    Time: 2735-3029 PT Time Calculation (min) (ACUTE ONLY): 25 min   Charges:   PT Evaluation $PT Eval Low Complexity: 1 Low PT Treatments $Therapeutic Exercise: 8-22 mins      10:53 AM, 01/17/21 Jerene Pitch, DPT Physical Therapy with [facility]  [phone] office

## 2021-01-17 NOTE — ED Notes (Signed)
Nurse into room to give medication. Upon explaining to patient that she wil be receiving IV potassium pt became tearful. Pt states she does not want to have any more pain than she already experiences. This nurse explains that she will slow the infusion to a rate she can tolerate and if she cannot then if she would like it discontinued that is her choice. Pt agrees to try to receive medication, however remains tearful at this time.

## 2021-01-17 NOTE — Progress Notes (Signed)
   01/17/21 1315  Orthostatic Lying   BP- Lying (!) 93/30  Pulse- Lying 84  Orthostatic Sitting  BP- Sitting 92/50  Pulse- Sitting 88  Orthostatic Standing at 0 minutes  BP- Standing at 0 minutes (!) 85/43  Pulse- Standing at 0 minutes 92  MD Ugah made aware via secure chat. Pt is asymptomatic, NS going at 42ml/hr. Pt denies distress. Will continue to monitor closely.

## 2021-01-17 NOTE — H&P (Signed)
TRH H&P    Patient Demographics:    Annette [name], is a 60 y.o. female  MRN: [id number]  DOB - Nov 05, 1960  Admit Date - 01/16/2021  Referring MD/NP/PA: Rancour  Outpatient Primary MD for the patient is Fayrene Helper, MD  Patient coming from: Home  Chief complaint- back/leg pain   HPI:    Annette [name]  is a 60 y.o. female, with history of sleep apnea, pituitary insufficiency, obesity, thyroid disease, GERD, diabetes mellitus type 2, recent lumbar surgery, and more presents the ED with a chief complaint of leg pain.  Patient reports that she had a lumbar surgery on March 2 and her legs have hurt ever since then.  She reports this is her fourth visit to the ED for the same problem.  She has dizziness, syncope as well.  She reports that she has not been eating since her surgery because she has had no appetite.  She reports that she knew her potassium and her fluid status were low because they were the last time she was in the ED, but she reports she left AMA.  Patient describes her pain as occurring from the hips down to the top of her feet.  It is a full leg ache and not a shooting type of pain.  She reports that she is called her surgeon several times, but has not been put on any pain medication so she was taking her friend's OxyContin 15 mg.  She reports that her pain is a 9 out of 10, and it improves to a 2 out of 10 when she takes the OxyContin.  Walking around does not make the pain better or worse.  Patient reports that she has no muscle cramps.  Patient reports that she is gone downhill since her surgery March 2.  Again she reports she has not been eating, but drinking very little water.  She reports her last normal bowel movement was before the surgery and that she has had diarrhea 3-6 times per day since then.  There is no blood or mucus in the stool.  She reports that she has had a C. difficile test.   She has not been on any recent antibiotics.  Patient describes 3 episodes of syncope since her surgery.  The first 1 was upon standing.  The second 1 was when she was already exerting herself, husband caught her as she collapsed, and she reports tremors associated with that episode.  The third 1 was today when she was trying to get out of the car she collapsed again.  She denies any trauma/injuries during these episodes of syncope because her husband has caught her and lowered her to the ground safely.  Patient reports that she smokes half a pack a day but declines a nicotine patch she does not drink alcohol.  She does not use illicit drugs.  She is not vaccinated for COVID.  Patient is full code.  In the ED Temp 98.3, heart rate 92-1 02, respiratory rate 13-28, blood pressure 102/45, satting 92% Mild leukocytosis with a  white blood cell count of 12.4, hemoglobin 16.0 Chemistry panel reveals a hypokalemia of 2.9, hypochloremia 98, hyperglycemia 140 C. difficile negative CT abdomen pelvis shows no pulmonary embolus, multiple lymph nodes without definite lymphadenopathy, no acute intra-abdominal abnormality EKG shows a heart rate of 90, QTc 472, sinus rhythm Troponin 4, 4 D-dimer 1.26 TSH 1.35 Patient was given 40 mEq of potassium p.o. but still has not finished at the time of my exam, Zofran, 3 L normal saline She remains orthostatic after 3 L normal saline bolus Admission requested for intractable pain, syncope    Review of systems:    In addition to the HPI above,  No Fever-chills, No Headache, No changes with Vision or hearing, No problems swallowing food or Liquids, No Chest pain, Cough or Shortness of Breath, No Abdominal pain, admits to nausea, diarrhea No Blood in stool or Urine, No dysuria, No new skin rashes or bruises, No new weakness, tingling, numbness in any extremity, No recent weight gain or loss, No polyuria, polydypsia or polyphagia, No significant Mental  Stressors.  All other systems reviewed and are negative.    Past History of the following :    Past Medical History:  Diagnosis Date  . Arthritis    Phreesia 10/14/2020  . Blood transfusion without reported diagnosis    Phreesia 10/14/2020  . Breast cancer ([place]) 2007   History of  XRT, onTamoxifen  . Bruises easily   . Cancer ([place])    Phreesia 10/14/2020  . Depression   . Depression    Phreesia 10/14/2020  . Diabetes mellitus    prediabetic  . Diabetes mellitus without complication ([place])    Phreesia 10/14/2020  . GERD (gastroesophageal reflux disease)   . Hypertriglyceridemia   . Hypothyroidism    following chemo amnd radiaition for breast cancer, needed replacement short term  . Hypothyroidism (acquired)    replaced x 1 year  . IBS (irritable bowel syndrome)   . Menieres disease    Controlled with triamterene   . Migraines   . Obesity   . Palpitations   . 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[place])   . Sleep apnea 2010   Problems with CPAP      Past Surgical History:  Procedure Laterality Date  . ABDOMINAL EXPOSURE N/A 12/03/2020   Procedure: ABDOMINAL EXPOSURE;  Surgeon: Rosetta Posner, MD;  [facility];  Service: Vascular;  Laterality: N/A;  . ABDOMINAL HYSTERECTOMY  1998   Benign, fibroids  . 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 ANTERIOR LUMBAR FUSION N/A 12/03/2020   Procedure: Lumbar Four-Five Lumbar Five Sacral One Anterior lumbar interbody fusion;  Surgeon: Ashok Pall, MD;  Location: [facility];  Service: Neurosurgery;  Laterality: N/A;  . APPENDECTOMY  06/12/08  . BIOPSY  07/02/2020   Procedure: BIOPSY;  Surgeon: Harvel Quale, MD;  [facility];  Service: Gastroenterology;;  . BREAST LUMPECTOMY Left 2008  . BREAST RECONSTRUCTION     Left reconstructive  . BREAST SURGERY  2007   Left lumpectomy  . BREAST SURGERY     Mammosite - right side  . CHOLECYSTECTOMY  1985  . COLONOSCOPY WITH PROPOFOL N/A  07/02/2020   Procedure: COLONOSCOPY WITH PROPOFOL;  Surgeon: Harvel Quale, MD;  [facility];  Service: Gastroenterology;  Laterality: N/A;  1045  . ESOPHAGEAL DILATION  12/19/2015   Procedure: ESOPHAGEAL DILATION;  Surgeon: Rogene Houston, MD;  [facility];  Service: Endoscopy;;  . ESOPHAGOGASTRODUODENOSCOPY  N/A 12/19/2015   Procedure: ESOPHAGOGASTRODUODENOSCOPY (EGD);  Surgeon: Rogene Houston, MD;  [facility];  Service: Endoscopy;  Laterality: N/A;  11:40  . POLYPECTOMY  07/02/2020   Procedure: POLYPECTOMY;  Surgeon: Montez Morita, Quillian Quince, MD;  [facility];  Service: Gastroenterology;;  . 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 THYROIDECTOMY, PARTIAL  11/05/2010   Benign disease      Social History:      Social History   Tobacco Use  . Smoking status: Current Every Day Smoker    Packs/day: 0.50    Years: 40.00    Pack years: 20.00    Types: Cigarettes  . Smokeless tobacco: Never Used  . Tobacco comment: 1/2 pack a day  Substance Use Topics  . Alcohol use: No    Alcohol/week: 0.0 standard drinks    Comment: Encouraged to quit smoking. She has tried the nicotone gum and patches but did not help       Family History :     Family History  Problem Relation Age of Onset  . Diabetes Mother   . Arrhythmia Mother   . Heart disease Father   . Hyperlipidemia Father   . Arrhythmia Father   . Cancer Paternal Grandmother        Breast  . Diabetes Sister   . Diabetes Brother   . Heart disease Brother       Home Medications:   Prior to Admission medications   Medication Sig Start Date End Date Taking? Authorizing Provider  CLOTRIMAZOLE EX Apply 1 application topically daily as needed (itching an breast). 15 %   Yes [provider]  cyclobenzaprine (FLEXERIL) 10 MG tablet Take 1 tablet (10 mg total) by mouth 3 (three) times daily as needed for muscle  spasms. 12/06/20  Yes Pool, Mallie Mussel, MD  Dulaglutide (TRULICITY) 1.5 BJ/4.7WG SOPN Inject 0.5 mLs (1.5 mg total) into the skin once a week. 04/17/20  Yes Philemon Kingdom, MD  ergocalciferol (VITAMIN D2) 1.25 MG (50000 UT) capsule Take 50,000 Units by mouth once a week.   Yes [provider]  fluconazole (DIFLUCAN) 150 MG tablet TAKE ONE TABLET ONCE DAILY , AS NEEDED, FOR VAGINAL ITCH ASSOCIATED WITH ANTIBIOTIC USE Patient taking differently: Take 150 mg by mouth See admin instructions. TAKE ONE TABLET ONCE DAILY , AS NEEDED, FOR VAGINAL ITCH ASSOCIATED WITH ANTIBIOTIC USE 10/07/20  Yes Fayrene Helper, MD  insulin degludec (TRESIBA FLEXTOUCH) 200 UNIT/ML FlexTouch Pen Inject 50 Units into the skin at bedtime. Patient taking differently: Inject 46-48 Units into the skin at bedtime. 01/12/21  Yes Philemon Kingdom, MD  LORazepam (ATIVAN) 1 MG tablet Take 1 mg by mouth daily as needed (Nausea).   Yes [provider]  Melatonin 10 MG TABS Take 1 tablet by mouth daily.   Yes [provider]  Metoprolol Tartrate 37.5 MG TABS TAKE 1 TABLET BY MOUTH TWICE DAILY 01/12/21  Yes Fayrene Helper, MD  naproxen (NAPROSYN) 500 MG tablet TAKE 1 TABLET(500 MG) BY MOUTH TWICE DAILY WITH A MEAL Patient taking differently: Take 500 mg by mouth 2 (two) times daily with a meal. 01/08/21  Yes Fayrene Helper, MD  omeprazole (PRILOSEC) 40 MG capsule Take 1 capsule (40 mg total) by mouth daily. 10/11/20  Yes Fayrene Helper, MD  ondansetron (ZOFRAN ODT) 4 MG disintegrating tablet Take 1 tablet (4 mg total) by mouth every 8 (eight) hours as needed for nausea.  12/19/20  Yes Noemi Chapel, MD  oxyCODONE-acetaminophen (PERCOCET/ROXICET) 5-325 MG tablet Take 1 tablet by mouth every 8 (eight) hours as needed for severe pain. 01/16/21  Yes Couture, Cortni S, PA-C  predniSONE (DELTASONE) 10 MG tablet Take 1 tablet (10 mg total) by mouth daily for 5 days. 01/16/21 01/21/21 Yes Couture, Cortni S, PA-C   rosuvastatin (CRESTOR) 20 MG tablet Take 1 tablet (20 mg total) by mouth daily. 01/12/21  Yes Fayrene Helper, MD  temazepam (RESTORIL) 15 MG capsule Take 1 capsule (15 mg total) by mouth at bedtime. 11/03/20  Yes Fayrene Helper, MD  traZODone (DESYREL) 100 MG tablet Take 1 tablet (100 mg total) by mouth at bedtime. 01/12/21  Yes Fayrene Helper, MD  triamterene-hydrochlorothiazide (MAXZIDE-25) 37.5-25 MG tablet Take 1 tablet by mouth daily.   Yes [provider]  venlafaxine XR (EFFEXOR-XR) 75 MG 24 hr capsule TAKE 1 CAPSULE(75 MG) BY MOUTH DAILY Patient taking differently: Take 75 mg by mouth daily with breakfast. 01/05/21  Yes Fayrene Helper, MD  aspirin 81 MG EC tablet Take 81 mg by mouth daily. Patient not taking: Reported on 01/16/2021    [provider]  gabapentin (NEURONTIN) 100 MG capsule Take 2 capsules (200 mg total) by mouth 3 (three) times daily. In three days, increase to 300 mg TID Patient not taking: No sig reported 4/74/25   Delora Fuel, MD  glucose blood test strip See admin instructions. 01/31/20   [provider]  Insulin Pen Needle (PEN NEEDLES) 31G X 6 MM MISC See admin instructions. 01/31/20   [provider]  [facility] VERIO test strip USE AS DIRECTED FOUR TIMES DAILY 08/22/20   Fayrene Helper, MD  oxyCODONE (OXYCONTIN) 10 mg 12 hr tablet Take 1 tablet (10 mg total) by mouth every 12 (twelve) hours. Patient not taking: No sig reported 12/06/20   Earnie Larsson, MD     Allergies:     Allergies  Allergen Reactions  . Penicillins Swelling    Has patient had a PCN reaction causing immediate rash, facial/tongue/throat swelling, SOB or lightheadedness with hypotension: Yes Has patient had a PCN reaction causing severe rash involving mucus membranes or skin necrosis: No Has patient had a PCN reaction that required hospitalization: No  Has patient had a PCN reaction occurring within the last 10 years: No If all of the above  answers are "NO", then may proceed with Cephalosporin use.   Of face and tongue.  . Metformin And Related Diarrhea and Nausea Only    Pt to discontinue med due to intolerance, cramps and diarrhea  . Wound Dressing Adhesive Itching and Rash    Dermabond and other surgical glues for incisions  . Empagliflozin Other (See Comments)    Yeast infection  Jardiance  . Penicillin G Other (See Comments)  . Statins Other (See Comments)    Fell bad but on Crestor  . Zetia [Ezetimibe] Other (See Comments)    Feels funny     Physical Exam:   Vitals  Blood pressure (!) 105/51, pulse 99, temperature 98.1 F (36.7 C), resp. rate 20, height 5\' 5"  (1.651 m), weight 91.2 kg, SpO2 100 %.  1.  General: Patient lying supine in bed, no acute distress  2. Psychiatric: Anxious, alert and oriented x3, cooperative with exam  3. Neurologic: Speech and language are normal, face is symmetric, equal sensation in the upper extremities bilaterally, hypersensitivity in the lower extremities bilaterally, no acute deficit on limited exam  4. HEENMT:  Head is atraumatic, normocephalic, pupils reactive to light, neck is supple, trachea is midline, mucous membranes are moist  5. Respiratory : Lungs are clear to auscultation bilaterally without wheezes, rhonchi, rales, no clubbing, no cyanosis  6. Cardiovascular : Heart rate is normal, rhythm is regular, no murmurs rubs or gallops, no peripheral edema  7. Gastrointestinal:  Abdomen is soft, nondistended, tender in the left lower quadrant which she attributes to recent surgery, no masses palpated, bowel sounds active  8. Skin:  Skin is warm dry and intact without acute rash or lesion on limited exam  9.Musculoskeletal:  No acute deformity, no calf tenderness, peripheral pulses palpated    Data Review:    CBC Recent Labs  Lab 01/16/21 1816  WBC 12.4*  HGB 16.0*  HCT 46.6*  PLT 421*  MCV 96.9  MCH 33.3  MCHC 34.3  RDW 13.0  LYMPHSABS 4.2*   MONOABS 1.0  EOSABS 0.1  BASOSABS 0.1   ------------------------------------------------------------------------------------------------------------------  Results for orders placed or performed during the hospital encounter of 01/16/21 (from the past 48 hour(s))  CBC WITH DIFFERENTIAL     Status: Abnormal   Collection Time: 01/16/21  6:16 PM  Result Value Ref Range   WBC 12.4 (H) 4.0 - 10.5 K/uL   RBC 4.81 3.87 - 5.11 MIL/uL   Hemoglobin 16.0 (H) 12.0 - 15.0 g/dL   HCT 46.6 (H) 36.0 - 46.0 %   MCV 96.9 80.0 - 100.0 fL   MCH 33.3 26.0 - 34.0 pg   MCHC 34.3 30.0 - 36.0 g/dL   RDW 13.0 11.5 - 15.5 %   Platelets 421 (H) 150 - 400 K/uL   nRBC 0.0 0.0 - 0.2 %   Neutrophils Relative % 56 %   Neutro Abs 7.0 1.7 - 7.7 K/uL   Lymphocytes Relative 34 %   Lymphs Abs 4.2 (H) 0.7 - 4.0 K/uL   Monocytes Relative 8 %   Monocytes Absolute 1.0 0.1 - 1.0 K/uL   Eosinophils Relative 1 %   Eosinophils Absolute 0.1 0.0 - 0.5 K/uL   Basophils Relative 1 %   Basophils Absolute 0.1 0.0 - 0.1 K/uL   Immature Granulocytes 0 %   Abs Immature Granulocytes 0.03 0.00 - 0.07 K/uL    Comment: Performed at [facility], [address], [place], [place] [address]  Comprehensive metabolic panel     Status: Abnormal   Collection Time: 01/16/21  6:16 PM  Result Value Ref Range   Sodium 134 (L) 135 - 145 mmol/L   Potassium 2.9 (L) 3.5 - 5.1 mmol/L   Chloride 98 98 - 111 mmol/L   CO2 22 22 - 32 mmol/L   Glucose, Bld 140 (H) 70 - 99 mg/dL    Comment: Glucose reference range applies only to samples taken after fasting for at least 8 hours.   BUN 16 8 - 23 mg/dL   Creatinine, Ser 1.05 (H) 0.44 - 1.00 mg/dL   Calcium 10.0 8.9 - 10.3 mg/dL   Total Protein 8.0 6.5 - 8.1 g/dL   Albumin 4.1 3.5 - 5.0 g/dL   AST 34 15 - 41 U/L   ALT 29 0 - 44 U/L   Alkaline Phosphatase 89 38 - 126 U/L   Total Bilirubin 0.7 0.3 - 1.2 mg/dL   GFR, Estimated >60 >60 mL/min    Comment: (NOTE) Calculated using the CKD-EPI  Creatinine Equation (2021)    Anion gap 14 5 - 15    Comment: Performed at [facility],  [address] [place], [place] [address]  Lipase, blood     Status: Abnormal   Collection Time: 01/16/21  6:16 PM  Result Value Ref Range   Lipase 86 (H) 11 - 51 U/L    Comment: Performed at [facility], [address] [place], [place] [address]  D-dimer, quantitative     Status: Abnormal   Collection Time: 01/16/21  6:16 PM  Result Value Ref Range   D-Dimer, Quant 1.26 (H) 0.00 - 0.50 ug/mL-FEU    Comment: (NOTE) At the manufacturer cut-off value of 0.5 g/mL FEU, this assay has a negative predictive value of 95-100%.This assay is intended for use in conjunction with a clinical pretest probability (PTP) assessment model to exclude pulmonary embolism (PE) and deep venous thrombosis (DVT) in outpatients suspected of PE or DVT. Results should be correlated with clinical presentation. Performed at [facility], [address] [place], [place] [address]   Troponin I (High Sensitivity)     Status: None   Collection Time: 01/16/21  6:16 PM  Result Value Ref Range   Troponin I (High Sensitivity) 4 <18 ng/L    Comment: (NOTE) Elevated high sensitivity troponin I (hsTnI) values and significant  changes across serial measurements may suggest ACS but many other  chronic and acute conditions are known to elevate hsTnI results.  Refer to the "Links" section for chest pain algorithms and additional  guidance. Performed at [facility], [address] , [place] [address]   Magnesium     Status: None   Collection Time: 01/16/21  6:16 PM  Result Value Ref Range   Magnesium 1.9 1.7 - 2.4 mg/dL    Comment: Performed at [facility], [address], [place], [place] [address]  TSH     Status: None   Collection Time: 01/16/21  6:16 PM  Result Value Ref Range   TSH 1.835 0.350 - 4.500 uIU/mL    Comment: Performed by a 3rd Generation assay with a functional sensitivity of <=0.01 uIU/mL. Performed at  [facility], [address], [place], [place] [address]   Troponin I (High Sensitivity)     Status: None   Collection Time: 01/16/21  9:25 PM  Result Value Ref Range   Troponin I (High Sensitivity) 4 <18 ng/L    Comment: (NOTE) Elevated high sensitivity troponin I (hsTnI) values and significant  changes across serial measurements may suggest ACS but many other  chronic and acute conditions are known to elevate hsTnI results.  Refer to the "Links" section for chest pain algorithms and additional  guidance. Performed at [facility], [address] [place], [place] [address]   Urinalysis, Routine w reflex microscopic Urine, Clean Catch     Status: Abnormal   Collection Time: 01/16/21 10:40 PM  Result Value Ref Range   Color, Urine STRAW (A) YELLOW   APPearance CLEAR CLEAR   Specific Gravity, Urine 1.042 (H) 1.005 - 1.030   pH 7.0 5.0 - 8.0   Glucose, UA NEGATIVE NEGATIVE mg/dL   Hgb urine dipstick SMALL (A) NEGATIVE   Bilirubin Urine NEGATIVE NEGATIVE   Ketones, ur NEGATIVE NEGATIVE mg/dL   Protein, ur NEGATIVE NEGATIVE mg/dL   Nitrite NEGATIVE NEGATIVE   Leukocytes,Ua NEGATIVE NEGATIVE   RBC / HPF 0-5 0 - 5 RBC/hpf   WBC, UA 0-5 0 - 5 WBC/hpf   Bacteria, UA RARE (A) NONE SEEN   Squamous Epithelial / LPF 0-5 0 - 5    Comment: Performed at [facility], [place]  [address], [place], [place] [address]  C Difficile Quick Screen w PCR reflex     Status: None   Collection Time: 01/16/21 10:40 PM   Specimen: STOOL  Result Value Ref Range   C Diff antigen NEGATIVE NEGATIVE   C Diff toxin NEGATIVE NEGATIVE   C Diff interpretation No C. difficile detected.     Comment: Performed at [facility], [address], [place], [place] [address]  Resp Panel by RT-PCR (Flu A&B, Covid) Nasopharyngeal Swab     Status: None   Collection Time: 01/17/21 12:50 AM   Specimen: Nasopharyngeal Swab; Nasopharyngeal(NP) swabs in vial transport medium  Result Value Ref Range   SARS Coronavirus 2 by RT PCR  NEGATIVE NEGATIVE    Comment: (NOTE) SARS-CoV-2 target nucleic acids are NOT DETECTED.  The SARS-CoV-2 RNA is generally detectable in upper respiratory specimens during the acute phase of infection. The lowest concentration of SARS-CoV-2 viral copies this assay can detect is 138 copies/mL. A negative result does not preclude SARS-Cov-2 infection and should not be used as the sole basis for treatment or other patient management decisions. A negative result may occur with  improper specimen collection/handling, submission of specimen other than nasopharyngeal swab, presence of viral mutation(s) within the areas targeted by this assay, and inadequate number of viral copies(<138 copies/mL). A negative result must be combined with clinical observations, patient history, and epidemiological information. The expected result is Negative.  Fact Sheet for Patients:  EntrepreneurPulse.com.au  Fact Sheet for Healthcare Providers:  IncredibleEmployment.be  This test is no t yet approved or cleared by the [place] FDA and  has been authorized for detection and/or diagnosis of SARS-CoV-2 by FDA under an Emergency Use Authorization (EUA). This EUA will remain  in effect (meaning this test can be used) for the duration of the COVID-19 declaration under Section 564(b)(1) of the Act, 21 U.S.C.section 360bbb-3(b)(1), unless the authorization is terminated  or revoked sooner.       Influenza A by PCR NEGATIVE NEGATIVE   Influenza B by PCR NEGATIVE NEGATIVE    Comment: (NOTE) The Xpert Xpress SARS-CoV-2/FLU/RSV plus assay is intended as an aid in the diagnosis of influenza from Nasopharyngeal swab specimens and should not be used as a sole basis for treatment. Nasal washings and aspirates are unacceptable for Xpert Xpress SARS-CoV-2/FLU/RSV testing.  Fact Sheet for Patients: EntrepreneurPulse.com.au  Fact Sheet for Healthcare  Providers: IncredibleEmployment.be  This test is not yet approved or cleared by the [place] FDA and has been authorized for detection and/or diagnosis of SARS-CoV-2 by FDA under an Emergency Use Authorization (EUA). This EUA will remain in effect (meaning this test can be used) for the duration of the COVID-19 declaration under Section 564(b)(1) of the Act, 21 U.S.C. section 360bbb-3(b)(1), unless the authorization is terminated or revoked.  Performed at [facility] [place], [address] [place], [place] [address]     Chemistries  Recent Labs  Lab 01/16/21 1816  NA 134*  K 2.9*  CL 98  CO2 22  GLUCOSE 140*  BUN 16  CREATININE 1.05*  CALCIUM 10.0  MG 1.9  AST 34  ALT 29  ALKPHOS 89  BILITOT 0.7   ------------------------------------------------------------------------------------------------------------------  ------------------------------------------------------------------------------------------------------------------ GFR: Estimated Creatinine Clearance: 62.8 mL/min (A) (by C-G formula based on SCr of 1.05 mg/dL (H)). Liver Function Tests: Recent Labs  Lab 01/16/21 1816  AST 34  ALT 29  ALKPHOS 89  BILITOT 0.7  PROT 8.0  ALBUMIN 4.1   Recent Labs  Lab 01/16/21 1816  LIPASE 86*   No results for input(s): AMMONIA in the last 168 hours. Coagulation Profile: No results for input(s): INR, PROTIME in the last 168 hours. Cardiac Enzymes: No results for input(s): CKTOTAL, CKMB, CKMBINDEX, TROPONINI in the last 168 hours. BNP (last 3 results) No results for input(s): PROBNP in the last 8760 hours. HbA1C: No results for input(s): HGBA1C in the last 72 hours. CBG: No results for input(s): GLUCAP in the last 168 hours. Lipid Profile: No results for input(s): CHOL, HDL, LDLCALC, TRIG, CHOLHDL, LDLDIRECT in the last 72 hours. Thyroid Function Tests: Recent Labs    01/16/21 1816  TSH 1.835   Anemia Panel: No results for input(s):  VITAMINB12, FOLATE, FERRITIN, TIBC, IRON, RETICCTPCT in the last 72 hours.  --------------------------------------------------------------------------------------------------------------- Urine analysis:    Component Value Date/Time   COLORURINE STRAW (A) 01/16/2021 2240   APPEARANCEUR CLEAR 01/16/2021 2240   LABSPEC 1.042 (H) 01/16/2021 2240   PHURINE 7.0 01/16/2021 2240   GLUCOSEU NEGATIVE 01/16/2021 2240   HGBUR SMALL (A) 01/16/2021 2240   BILIRUBINUR NEGATIVE 01/16/2021 2240   BILIRUBINUR neg 01/20/2012 1128   KETONESUR NEGATIVE 01/16/2021 2240   PROTEINUR NEGATIVE 01/16/2021 2240   UROBILINOGEN 0.2 12/23/2013 2210   NITRITE NEGATIVE 01/16/2021 2240   LEUKOCYTESUR NEGATIVE 01/16/2021 2240      Imaging Results:    CT HEAD WO CONTRAST  Result Date: 01/16/2021 CLINICAL DATA:  Syncope EXAM: CT HEAD WITHOUT CONTRAST TECHNIQUE: Contiguous axial images were obtained from the base of the skull through the vertex without intravenous contrast. COMPARISON:  None. FINDINGS: Brain: No acute intracranial abnormality. Specifically, no hemorrhage, hydrocephalus, mass lesion, acute infarction, or significant intracranial injury. Vascular: No hyperdense vessel or unexpected calcification. Skull: No acute calvarial abnormality. Sinuses/Orbits: No acute findings Other: None IMPRESSION: No acute intracranial abnormality. Electronically Signed   By: Rolm Baptise M.D.   On: 01/16/2021 17:56   CT Angio Chest PE W and/or Wo Contrast  Result Date: 01/16/2021 CLINICAL DATA:  Nonlocalized acute abdominal pain., positive D-dimer with nausea, vomiting, diarrhea and dehydration. Surgery spine 12/03/2020. EXAM: CT ANGIOGRAPHY CHEST CT ABDOMEN AND PELVIS WITH CONTRAST TECHNIQUE: Multidetector CT imaging of the chest was performed using the standard protocol during bolus administration of intravenous contrast. Multiplanar CT image reconstructions and MIPs were obtained to evaluate the vascular anatomy. Multidetector  CT imaging of the abdomen and pelvis was performed using the standard protocol during bolus administration of intravenous contrast. CONTRAST:  143mL OMNIPAQUE IOHEXOL 350 MG/ML SOLN COMPARISON:  CT lumbar spine 09/09/2020, CT chest 03/21/2020, CT abdomen pelvis 12/24/2013 FINDINGS: CTA CHEST FINDINGS Cardiovascular: Satisfactory opacification of the pulmonary arteries to the segmental level. No evidence of pulmonary embolism. The main pulmonary artery is normal in caliber. Normal heart size. No significant pericardial effusion. The thoracic aorta is normal in caliber. Trace atherosclerotic plaque of the thoracic aorta. No coronary artery calcifications. Mediastinum/Nodes: Several prominent but nonenlarged bilateral hilar lymph nodes. Status post left axillary lymph node dissection. No enlarged mediastinal, hilar, or axillary lymph nodes. Status post left thyroidectomy. Otherwise the thyroid gland, trachea, and esophagus demonstrate no significant findings. Lungs/Pleura: No pulmonary nodule. No pulmonary mass. Bilateral lower lobe subsegmental atelectasis. Thin walled cystic lesion within the right upper lobe likely benign etiology. No focal consolidation. No pleural effusion. No pneumothorax. Musculoskeletal: No chest wall abnormality.  Left mastectomy. No suspicious lytic or blastic osseous lesions. No acute displaced fracture. Review of the MIP images confirms the above findings. CT ABDOMEN and PELVIS FINDINGS Hepatobiliary: No focal liver abnormality.  Status post cholecystectomy. No biliary dilatation. Pancreas: No focal lesion. Normal pancreatic contour. No surrounding inflammatory changes. No main pancreatic ductal dilatation. Spleen: Status post splenectomy. Adrenals/Urinary Tract: Stable 2.2 cm right adrenal gland nodule with a density of 63 Hounsfield units. Interval development of a nother right adrenal gland nodule with a density of 27 Hounsfield units and size of approximately 1.4 cm. Status post left  adrenalectomy. Bilateral kidneys enhance symmetrically. Fluid density lesion within the right kidney likely represents a simple renal cyst. Subcentimeter hypodensities are too small to characterize. No hydronephrosis. No hydroureter. The urinary bladder is unremarkable. On delayed imaging, there is no urothelial wall thickening and there are no filling defects in the opacified portions of the bilateral collecting systems or ureters. Stomach/Bowel: Stomach is within normal limits. No evidence of bowel wall thickening or dilatation. Status post appendectomy Vascular/Lymphatic: No abdominal aorta or iliac aneurysm. Trace atherosclerotic plaque of the aorta and its branches. No abdominal, pelvic, or inguinal lymphadenopathy. Reproductive: Status post hysterectomy. No adnexal masses. Other: No intraperitoneal free fluid. No intraperitoneal free gas. No organized fluid collection. Musculoskeletal: No abdominal wall hernia or abnormality. Healed anterior abdominal incisions. No suspicious lytic or blastic osseous lesions. No acute displaced fracture. Multilevel degenerative changes of the spine. Anterior and interbody L4-L5 and L5-S1 fusion with surgical hardware. Review of the MIP images confirms the above findings. IMPRESSION: 1. No pulmonary embolus. 2. Several prominent bilateral hilar lymph nodes with no definite lymphadenopathy. Recommend attention on follow-up. Otherwise no acute intra-abdominal abnormality. 3. Interval development of a 1.4 cm right adrenal gland nodule. Stable other 2.2 cm right adrenal gland nodule. Patient status post left adrenalectomy. Recommend CT adrenal protocol for further evaluation. 4. Status post left thyroidectomy, left mastectomy, left axillary lymph node dissection, left adrenalectomy, cholecystectomy, splenectomy, appendectomy, hysterectomy. 5.  Aortic Atherosclerosis (ICD10-I70.0) - trace. Electronically Signed   By: Iven Finn M.D.   On: 01/16/2021 21:07   CT ABDOMEN PELVIS  W CONTRAST  Result Date: 01/16/2021 CLINICAL DATA:  Nonlocalized acute abdominal pain., positive D-dimer with nausea, vomiting, diarrhea and dehydration. Surgery spine 12/03/2020. EXAM: CT ANGIOGRAPHY CHEST CT ABDOMEN AND PELVIS WITH CONTRAST TECHNIQUE: Multidetector CT imaging of the chest was performed using the standard protocol during bolus administration of intravenous contrast. Multiplanar CT image reconstructions and MIPs were obtained to evaluate the vascular anatomy. Multidetector CT imaging of the abdomen and pelvis was performed using the standard protocol during bolus administration of intravenous contrast. CONTRAST:  144mL OMNIPAQUE IOHEXOL 350 MG/ML SOLN COMPARISON:  CT lumbar spine 09/09/2020, CT chest 03/21/2020, CT abdomen pelvis 12/24/2013 FINDINGS: CTA CHEST FINDINGS Cardiovascular: Satisfactory opacification of the pulmonary arteries to the segmental level. No evidence of pulmonary embolism. The main pulmonary artery is normal in caliber. Normal heart size. No significant pericardial effusion. The thoracic aorta is normal in caliber. Trace atherosclerotic plaque of the thoracic aorta. No coronary artery calcifications. Mediastinum/Nodes: Several prominent but nonenlarged bilateral hilar lymph nodes. Status post left axillary lymph node dissection. No enlarged mediastinal, hilar, or axillary lymph nodes. Status post left thyroidectomy. Otherwise the thyroid gland, trachea, and esophagus demonstrate no significant findings. Lungs/Pleura: No pulmonary nodule. No pulmonary mass. Bilateral lower lobe subsegmental atelectasis. Thin walled cystic lesion within the right upper lobe likely benign etiology. No focal consolidation. No pleural effusion. No pneumothorax. Musculoskeletal: No chest wall abnormality.  Left mastectomy. No suspicious lytic or blastic osseous lesions. No acute displaced fracture. Review of the MIP images confirms the above findings. CT ABDOMEN and PELVIS FINDINGS Hepatobiliary:  No focal liver abnormality. Status post cholecystectomy. No biliary dilatation. Pancreas: No focal lesion. Normal pancreatic contour. No surrounding inflammatory changes. No main pancreatic ductal dilatation. Spleen: Status post splenectomy. Adrenals/Urinary Tract: Stable 2.2 cm right adrenal gland nodule with a density of 63 Hounsfield units. Interval development of a nother right adrenal gland nodule with a density of 27 Hounsfield units and size of approximately 1.4 cm. Status post left adrenalectomy. Bilateral kidneys enhance symmetrically. Fluid density lesion within the right kidney likely represents a simple renal cyst. Subcentimeter hypodensities are too small to characterize. No hydronephrosis. No hydroureter. The urinary bladder is unremarkable. On delayed imaging, there is no urothelial wall thickening and there are no filling defects in the opacified portions of the bilateral collecting systems or ureters. Stomach/Bowel: Stomach is within normal limits. No evidence of bowel wall thickening or dilatation. Status post appendectomy Vascular/Lymphatic: No abdominal aorta or iliac aneurysm. Trace atherosclerotic plaque of the aorta and its branches. No abdominal, pelvic, or inguinal lymphadenopathy. Reproductive: Status post hysterectomy. No adnexal masses. Other: No intraperitoneal free fluid. No intraperitoneal free gas. No organized fluid collection. Musculoskeletal: No abdominal wall hernia or abnormality. Healed anterior abdominal incisions. No suspicious lytic or blastic osseous lesions. No acute displaced fracture. Multilevel degenerative changes of the spine. Anterior and interbody L4-L5 and L5-S1 fusion with surgical hardware. Review of the MIP images confirms the above findings. IMPRESSION: 1. No pulmonary embolus. 2. Several prominent bilateral hilar lymph nodes with no definite lymphadenopathy. Recommend attention on follow-up. Otherwise no acute intra-abdominal abnormality. 3. Interval  development of a 1.4 cm right adrenal gland nodule. Stable other 2.2 cm right adrenal gland nodule. Patient status post left adrenalectomy. Recommend CT adrenal protocol for further evaluation. 4. Status post left thyroidectomy, left mastectomy, left axillary lymph node dissection, left adrenalectomy, cholecystectomy, splenectomy, appendectomy, hysterectomy. 5.  Aortic Atherosclerosis (ICD10-I70.0) - trace. Electronically Signed   By: Iven Finn M.D.   On: 01/16/2021 21:07    My personal review of EKG: Rhythm NSR, Rate 90 /min, QTc 472 ,no Acute ST changes   Assessment & Plan:    Active Problems:   Syncope   1. Syncope 1. Echo and ultrasound carotids in the a.m. 2. Orthostatics every shift -orthostatics remain positive after 3 L bolus 3. Meclizine for dizziness 4. Holding Maxzide 5. Monitor on telemetry 2. Intractable pain 1. Scheduled oxycodone 2. Immediate release oxycodone prior moderate pain 3. Morphine for severe pain 4. Start gabapentin for nerve pain 5. Consult PT 3. Hypokalemia 1. Replace and recheck 4. Leukocytosis 1. Likely stress reaction, urine not indicative of UTI, chest and abdomen imaging do not reveal a infection, afebrile, continue to monitor 5. Diabetes mellitus type 2 1. 50 units of long-acting insulin at baseline 2. Continue 35 units of long-acting insulin, sliding scale, carb modified diet 3. Hemoglobin A1c pending 4. Continue to monitor 6.  7.    DVT Prophylaxis-   Heparin- SCDs AM Labs Ordered, also please review Full Orders  Family Communication: No family at bedside Code Status: Full  Admission status: Observation Time spent in minutes :[place] DO

## 2021-01-17 NOTE — Progress Notes (Signed)
*  PRELIMINARY RESULTS* Echocardiogram 2D Echocardiogram has been performed.  Samuel Germany 01/17/2021, 1:03 PM

## 2021-01-18 DIAGNOSIS — Z79890 Hormone replacement therapy (postmenopausal): Secondary | ICD-10-CM | POA: Diagnosis not present

## 2021-01-18 DIAGNOSIS — G4733 Obstructive sleep apnea (adult) (pediatric): Secondary | ICD-10-CM | POA: Diagnosis present

## 2021-01-18 DIAGNOSIS — E1165 Type 2 diabetes mellitus with hyperglycemia: Secondary | ICD-10-CM | POA: Diagnosis present

## 2021-01-18 DIAGNOSIS — K219 Gastro-esophageal reflux disease without esophagitis: Secondary | ICD-10-CM | POA: Diagnosis present

## 2021-01-18 DIAGNOSIS — Z791 Long term (current) use of non-steroidal anti-inflammatories (NSAID): Secondary | ICD-10-CM | POA: Diagnosis not present

## 2021-01-18 DIAGNOSIS — E1169 Type 2 diabetes mellitus with other specified complication: Secondary | ICD-10-CM | POA: Diagnosis present

## 2021-01-18 DIAGNOSIS — Z888 Allergy status to other drugs, medicaments and biological substances status: Secondary | ICD-10-CM | POA: Diagnosis not present

## 2021-01-18 DIAGNOSIS — Z9049 Acquired absence of other specified parts of digestive tract: Secondary | ICD-10-CM | POA: Diagnosis not present

## 2021-01-18 DIAGNOSIS — R52 Pain, unspecified: Secondary | ICD-10-CM | POA: Diagnosis not present

## 2021-01-18 DIAGNOSIS — E876 Hypokalemia: Secondary | ICD-10-CM | POA: Diagnosis present

## 2021-01-18 DIAGNOSIS — I951 Orthostatic hypotension: Secondary | ICD-10-CM | POA: Diagnosis present

## 2021-01-18 DIAGNOSIS — R197 Diarrhea, unspecified: Secondary | ICD-10-CM | POA: Diagnosis not present

## 2021-01-18 DIAGNOSIS — Z79899 Other long term (current) drug therapy: Secondary | ICD-10-CM | POA: Diagnosis not present

## 2021-01-18 DIAGNOSIS — Z880 Allergy status to penicillin: Secondary | ICD-10-CM | POA: Diagnosis not present

## 2021-01-18 DIAGNOSIS — Z853 Personal history of malignant neoplasm of breast: Secondary | ICD-10-CM | POA: Diagnosis not present

## 2021-01-18 DIAGNOSIS — Z86010 Personal history of colonic polyps: Secondary | ICD-10-CM | POA: Diagnosis not present

## 2021-01-18 DIAGNOSIS — Z923 Personal history of irradiation: Secondary | ICD-10-CM | POA: Diagnosis not present

## 2021-01-18 DIAGNOSIS — F32A Depression, unspecified: Secondary | ICD-10-CM | POA: Diagnosis present

## 2021-01-18 DIAGNOSIS — H8109 Meniere's disease, unspecified ear: Secondary | ICD-10-CM | POA: Diagnosis present

## 2021-01-18 DIAGNOSIS — R55 Syncope and collapse: Secondary | ICD-10-CM | POA: Diagnosis present

## 2021-01-18 DIAGNOSIS — K529 Noninfective gastroenteritis and colitis, unspecified: Secondary | ICD-10-CM | POA: Diagnosis present

## 2021-01-18 DIAGNOSIS — Z90710 Acquired absence of both cervix and uterus: Secondary | ICD-10-CM | POA: Diagnosis not present

## 2021-01-18 DIAGNOSIS — Z9081 Acquired absence of spleen: Secondary | ICD-10-CM | POA: Diagnosis not present

## 2021-01-18 DIAGNOSIS — E785 Hyperlipidemia, unspecified: Secondary | ICD-10-CM | POA: Diagnosis present

## 2021-01-18 DIAGNOSIS — Z981 Arthrodesis status: Secondary | ICD-10-CM | POA: Diagnosis not present

## 2021-01-18 DIAGNOSIS — Z8719 Personal history of other diseases of the digestive system: Secondary | ICD-10-CM | POA: Diagnosis not present

## 2021-01-18 DIAGNOSIS — Z7982 Long term (current) use of aspirin: Secondary | ICD-10-CM | POA: Diagnosis not present

## 2021-01-18 DIAGNOSIS — Z20822 Contact with and (suspected) exposure to covid-19: Secondary | ICD-10-CM | POA: Diagnosis present

## 2021-01-18 LAB — BASIC METABOLIC PANEL
Anion gap: 7 (ref 5–15)
BUN: 7 mg/dL — ABNORMAL LOW (ref 8–23)
CO2: 24 mmol/L (ref 22–32)
Calcium: 9.2 mg/dL (ref 8.9–10.3)
Chloride: 114 mmol/L — ABNORMAL HIGH (ref 98–111)
Creatinine, Ser: 0.81 mg/dL (ref 0.44–1.00)
GFR, Estimated: >60 mL/min (ref >60)
Glucose, Bld: 71 mg/dL (ref 70–99)
Potassium: 3.6 mmol/L (ref 3.5–5.1)
Sodium: 145 mmol/L (ref 135–145)

## 2021-01-18 LAB — GLUCOSE, CAPILLARY
Glucose-Capillary: 109 mg/dL — ABNORMAL HIGH (ref 70–99)
Glucose-Capillary: 68 mg/dL — ABNORMAL LOW (ref 70–99)
Glucose-Capillary: 156 mg/dL — ABNORMAL HIGH (ref 70–99)
Glucose-Capillary: 63 mg/dL — ABNORMAL LOW (ref 70–99)
Glucose-Capillary: 72 mg/dL (ref 70–99)
Glucose-Capillary: 114 mg/dL — ABNORMAL HIGH (ref 70–99)

## 2021-01-18 LAB — MAGNESIUM: Magnesium: 1.7 mg/dL (ref 1.7–2.4)

## 2021-01-18 MED ORDER — INSULIN DETEMIR 100 UNIT/ML ~~LOC~~ SOLN
20.0000 [IU] | Freq: Every day | SUBCUTANEOUS | Status: DC
  Administered 2021-01-20: 20 [IU] via SUBCUTANEOUS
  Filled 2021-01-18 (×8): qty 0.2

## 2021-01-18 MED ORDER — PSYLLIUM 95 % PO PACK
1.0000 | PACK | Freq: Every day | ORAL | Status: DC
  Filled 2021-01-18 (×2): qty 1

## 2021-01-18 NOTE — Care Plan (Addendum)
Pt FSBS is 63.  MD Kyung Bacca made aware via secure chat. Awaiting reply. Pt given orange juice and eating a nutty buddy from home. Will recheck per protocol. Pt is asymptomatic at this time .   Pt has had 2 hypoglycemic events today , now her sugar is 63, was 151 earlier. Not sure if you want to adjust so this dont happen again. Pt is eating meals and snacks and still running this low     Recheck: 72   1805:Writer: do you want the levemir held, just want to clarify  1805: MD: yes hold it

## 2021-01-18 NOTE — Progress Notes (Signed)
PROGRESS NOTE  Annette [name] BOF:[id number] DOB: 15-Mar-1961 DOA: 01/16/2021 PCP: Fayrene Helper, MD  HPI/Recap of past 24 hours: Patient admitted with chronic diarrhea since March 2022, she presented to the ED with dizziness and syncope Patient seen and examined at bedside she stated she still having diarrhea she feels quite tired Her potassium was noted to be 2.8 despite 4 rounds of potassium.  Her magnesium is also slightly low . This afternoon she was noted to be orthostatic hypotensive  January 18, 2021. Patient seen and examined at bedside she says she still having diarrhea no other complaint except the nurse has reported low blood pressures and this afternoon she had a 2 hypoglycemic episode of 63.  Assessment/Plan: Principal Problem:   Syncope Active Problems:   Type 2 diabetes mellitus with other specified complication (HCC)   Hypokalemia   Intractable pain   Leukocytosis  #1 syncope with orthostatic hypotension patient will be given another fluid bolus  #2 persistent diarrhea.  Her C. difficile was negative I will start her on Imodium.  I will add Metamucil  3.  Hypokalemia persistent likely secondary to diarrhea her magnesium was also low she has received 4 rounds of potassium I will add another IV potassium x3 rounds we will monitor her electrolytes I will replace her magnesium.  4.  Hypomagnesemia I will start her magnesium replacement.  Repleted  5.  Type 2 diabetes mellitus fairly controlled.  Continue Lantus and sliding scale  6.  Leukocytosis.  Reactive secondary to diarrhea may be  Code Status: FULL  Severity of Illness: The appropriate patient status for this patient is OBSERVATION. Observation status is judged to be reasonable and necessary in order to provide the required intensity of service to ensure the patient's safety. The patient's presenting symptoms, physical exam findings, and initial radiographic and laboratory data in the context of their  medical condition is felt to place them at decreased risk for further clinical deterioration. Furthermore, it is anticipated that the patient will be medically stable for discharge from the hospital within 2 midnights of admission. The following factors support the patient status of observation.   " Patient is experiencing orthostatic hypotension and she is still having diarrhea and her potassium still low.  Family Communication: PATIENT  * Disposition Plan: HOME WHEN STABLE . BP AND POTASSIUM ARE LOW   Consultants:  NONE  Procedures:  NONE  Antimicrobials:  NONE  DVT prophylaxis:  SQ HEPARINE   Objective: Vitals:   01/17/21 2116 01/18/21 0551 01/18/21 0741 01/18/21 1311  BP: (!) 97/53 (!) 108/46 (!) 106/53 (!) 103/42  Pulse: 84 82 88 81  Resp: 19 18 16 16   Temp: 97.9 F (36.6 C) (!) 97.4 F (36.3 C)  98.5 F (36.9 C)  TempSrc:    Oral  SpO2: 98% 94%  97%  Weight:      Height:        Intake/Output Summary (Last 24 hours) at 01/18/2021 1806 Last data filed at 01/18/2021 1342 Gross per 24 hour  Intake 2034.08 ml  Output 950 ml  Net 1084.08 ml   Filed Weights   01/16/21 1724  Weight: 91.2 kg   Body mass index is 33.45 kg/m.  Exam:  . General: 60 y.o. year-old female well developed well nourished in no acute distress.  Alert and oriented x3. . Cardiovascular: Regular rate and rhythm with no rubs or gallops.  No thyromegaly or JVD noted.   Marland Kitchen Respiratory: Clear to auscultation with no wheezes  or rales. Good inspiratory effort. . Abdomen: Soft nontender nondistended with normal bowel sounds x4 quadrants. . Musculoskeletal: No lower extremity edema. 2/4 pulses in all 4 extremities. . Skin: No ulcerative lesions noted or rashes, . Psychiatry: Mood is appropriate for condition and setting    Data Reviewed: CBC: Recent Labs  Lab 01/16/21 1816 01/17/21 0510  WBC 12.4* 12.7*  NEUTROABS 7.0 5.8  HGB 16.0* 14.9  HCT 46.6* 43.8  MCV 96.9 98.9  PLT 421* 681    Basic Metabolic Panel: Recent Labs  Lab 01/16/21 1816 01/17/21 0510 01/18/21 0525  NA 134* 138 145  K 2.9* 2.8* 3.6  CL 98 105 114*  CO2 22 21* 24  GLUCOSE 140* 90 71  BUN 16 11 7*  CREATININE 1.05* 0.87 0.81  CALCIUM 10.0 8.8* 9.2  MG 1.9 1.6* 1.7   GFR: Estimated Creatinine Clearance: 81.4 mL/min (by C-G formula based on SCr of 0.81 mg/dL). Liver Function Tests: Recent Labs  Lab 01/16/21 1816 01/17/21 0510  AST 34 27  ALT 29 25  ALKPHOS 89 78  BILITOT 0.7 0.5  PROT 8.0 7.1  ALBUMIN 4.1 3.6   Recent Labs  Lab 01/16/21 1816  LIPASE 86*   No results for input(s): AMMONIA in the last 168 hours. Coagulation Profile: No results for input(s): INR, PROTIME in the last 168 hours. Cardiac Enzymes: No results for input(s): CKTOTAL, CKMB, CKMBINDEX, TROPONINI in the last 168 hours. BNP (last 3 results) No results for input(s): PROBNP in the last 8760 hours. HbA1C: No results for input(s): HGBA1C in the last 72 hours. CBG: Recent Labs  Lab 01/18/21 0803 01/18/21 0828 01/18/21 1132 01/18/21 1632 01/18/21 1709  GLUCAP 68* 109* 156* 63* 72   Lipid Profile: No results for input(s): CHOL, HDL, LDLCALC, TRIG, CHOLHDL, LDLDIRECT in the last 72 hours. Thyroid Function Tests: Recent Labs    01/16/21 1816  TSH 1.835   Anemia Panel: No results for input(s): VITAMINB12, FOLATE, FERRITIN, TIBC, IRON, RETICCTPCT in the last 72 hours. Urine analysis:    Component Value Date/Time   COLORURINE STRAW (A) 01/16/2021 2240   APPEARANCEUR CLEAR 01/16/2021 2240   LABSPEC 1.042 (H) 01/16/2021 2240   PHURINE 7.0 01/16/2021 2240   GLUCOSEU NEGATIVE 01/16/2021 2240   HGBUR SMALL (A) 01/16/2021 2240   BILIRUBINUR NEGATIVE 01/16/2021 2240   BILIRUBINUR neg 01/20/2012 1128   KETONESUR NEGATIVE 01/16/2021 2240   PROTEINUR NEGATIVE 01/16/2021 2240   UROBILINOGEN 0.2 12/23/2013 2210   NITRITE NEGATIVE 01/16/2021 2240   LEUKOCYTESUR NEGATIVE 01/16/2021 2240   Sepsis  Labs: @LABRCNTIP (procalcitonin:4,lacticidven:4)  ) Recent Results (from the past 240 hour(s))  C Difficile Quick Screen w PCR reflex     Status: None   Collection Time: 01/16/21 10:40 PM   Specimen: STOOL  Result Value Ref Range Status   C Diff antigen NEGATIVE NEGATIVE Final   C Diff toxin NEGATIVE NEGATIVE Final   C Diff interpretation No C. difficile detected.  Final    Comment: Performed at [facility], [address] [place], [place] [address]  Resp Panel by RT-PCR (Flu A&B, Covid) Nasopharyngeal Swab     Status: None   Collection Time: 01/17/21 12:50 AM   Specimen: Nasopharyngeal Swab; Nasopharyngeal(NP) swabs in vial transport medium  Result Value Ref Range Status   SARS Coronavirus 2 by RT PCR NEGATIVE NEGATIVE Final    Comment: (NOTE) SARS-CoV-2 target nucleic acids are NOT DETECTED.  The SARS-CoV-2 RNA is generally detectable in upper respiratory specimens during the acute phase  of infection. The lowest concentration of SARS-CoV-2 viral copies this assay can detect is 138 copies/mL. A negative result does not preclude SARS-Cov-2 infection and should not be used as the sole basis for treatment or other patient management decisions. A negative result may occur with  improper specimen collection/handling, submission of specimen other than nasopharyngeal swab, presence of viral mutation(s) within the areas targeted by this assay, and inadequate number of viral copies(<138 copies/mL). A negative result must be combined with clinical observations, patient history, and epidemiological information. The expected result is Negative.  Fact Sheet for Patients:  EntrepreneurPulse.com.au  Fact Sheet for Healthcare Providers:  IncredibleEmployment.be  This test is no t yet approved or cleared by the [place] FDA and  has been authorized for detection and/or diagnosis of SARS-CoV-2 by FDA under an Emergency Use Authorization (EUA). This EUA  will remain  in effect (meaning this test can be used) for the duration of the COVID-19 declaration under Section 564(b)(1) of the Act, 21 U.S.C.section 360bbb-3(b)(1), unless the authorization is terminated  or revoked sooner.       Influenza A by PCR NEGATIVE NEGATIVE Final   Influenza B by PCR NEGATIVE NEGATIVE Final    Comment: (NOTE) The Xpert Xpress SARS-CoV-2/FLU/RSV plus assay is intended as an aid in the diagnosis of influenza from Nasopharyngeal swab specimens and should not be used as a sole basis for treatment. Nasal washings and aspirates are unacceptable for Xpert Xpress SARS-CoV-2/FLU/RSV testing.  Fact Sheet for Patients: EntrepreneurPulse.com.au  Fact Sheet for Healthcare Providers: IncredibleEmployment.be  This test is not yet approved or cleared by the [place] FDA and has been authorized for detection and/or diagnosis of SARS-CoV-2 by FDA under an Emergency Use Authorization (EUA). This EUA will remain in effect (meaning this test can be used) for the duration of the COVID-19 declaration under Section 564(b)(1) of the Act, 21 U.S.C. section 360bbb-3(b)(1), unless the authorization is terminated or revoked.  Performed at [facility], [address], [place], [place] [address]       Studies: No results found.  Scheduled Meds: . aspirin EC  81 mg Oral Daily  . gabapentin  300 mg Oral TID  . heparin  5,000 Units Subcutaneous Q8H  . insulin aspart  0-15 Units Subcutaneous TID WC  . insulin aspart  0-5 Units Subcutaneous QHS  . insulin detemir  20 Units Subcutaneous QHS  . magnesium oxide  800 mg Oral Daily  . meclizine  12.5 mg Oral TID  . melatonin  9 mg Oral QHS  . metoprolol tartrate  37.5 mg Oral BID  . oxyCODONE  10 mg Oral Q12H  . pantoprazole  40 mg Oral Daily  . psyllium  1 packet Oral Daily  . rosuvastatin  20 mg Oral Daily  . temazepam  15 mg Oral QHS  . traZODone  100 mg Oral QHS  . venlafaxine  XR  75 mg Oral Q breakfast    Continuous Infusions: . sodium chloride 75 mL/hr at 01/18/21 1342     LOS: 0 days     Cristal Deer, MD [facility]  To reach me or the doctor on call, go to: [email or web address] Password [facility]  01/18/2021, 6:06 PM

## 2021-01-18 NOTE — Progress Notes (Signed)
   01/18/21 1100  Unsuccessful Nursing Procedure/Treatment  Type of Nursing Procedure/Treatment Peripheral IV insertion  Number of attempts 2  Location of attempt [phone]  This writer attempted to place another IV as patient IV continued to beep in the [facility]. Pt jumped one time while placing IV and the 2nd time vein rolled. IV decided to be left in the [facility] and will continue to work with pump as patient didn't want IV wrapped to prevent beeping. Blood return noted on both insertions.

## 2021-01-19 LAB — HEMOGLOBIN A1C
Hgb A1c MFr Bld: 6 % — ABNORMAL HIGH (ref 4.8–5.6)
Mean Plasma Glucose: 126 mg/dL

## 2021-01-19 LAB — GLUCOSE, CAPILLARY
Glucose-Capillary: 86 mg/dL (ref 70–99)
Glucose-Capillary: 186 mg/dL — ABNORMAL HIGH (ref 70–99)
Glucose-Capillary: 103 mg/dL — ABNORMAL HIGH (ref 70–99)
Glucose-Capillary: 151 mg/dL — ABNORMAL HIGH (ref 70–99)

## 2021-01-19 LAB — PROCALCITONIN: Procalcitonin: <0.1 ng/mL

## 2021-01-19 MED ORDER — GLUCERNA SHAKE PO LIQD
237.0000 mL | Freq: Three times a day (TID) | ORAL | Status: DC
  Administered 2021-01-19 – 2021-01-23 (×10): 237 mL via ORAL

## 2021-01-19 MED ORDER — LOPERAMIDE HCL 2 MG PO CAPS
4.0000 mg | ORAL_CAPSULE | Freq: Two times a day (BID) | ORAL | Status: DC
  Administered 2021-01-19 – 2021-01-23 (×7): 4 mg via ORAL
  Filled 2021-01-19 (×7): qty 2

## 2021-01-19 MED ORDER — ADULT MULTIVITAMIN W/MINERALS CH
1.0000 | ORAL_TABLET | Freq: Every day | ORAL | Status: DC
  Administered 2021-01-19 – 2021-01-23 (×5): 1 via ORAL
  Filled 2021-01-19 (×5): qty 1

## 2021-01-19 NOTE — Progress Notes (Addendum)
PROGRESS NOTE    Annette [name]  SAY:[id number] DOB: 10/15/1960 DOA: 01/16/2021 PCP: Fayrene Helper, MD   Brief Narrative:  Patient admitted with chronic diarrhea since March 2022, she presented to the ED with dizziness and syncope. Patient seen and examined at bedside she stated she still having diarrhea she feels quite tired.  She continues have ongoing diarrhea which is a chronic issue for her.  C. difficile testing negative.  Finally she was noted to have associated syncope with orthostatic hypotension secondarily to this.  Assessment & Plan:   Principal Problem:   Syncope Active Problems:   Type 2 diabetes mellitus with other specified complication (HCC)   Hypokalemia   Intractable pain   Leukocytosis  Syncope with associated orthostasis -Secondary to volume depletion related to persistent diarrhea -Continue IV fluid and recheck orthostatics in am  Acute on chronic diarrhea  -May be related IBS -Check GI panel -Cdiff negative -Schedule Imodium -Will consider GI consultation if not improving -Recheck am labs -DC metamucil  Type 2 DM -Controlled -Continue SSI and Lantus  Leukocytosis -Likely reactive secondary to above -Plan to check procalcitonin  DVT prophylaxis: Heparin Code Status: Full Family Communication:None at bedside Disposition Plan:  Status is: Inpatient  Remains inpatient appropriate because:IV treatments appropriate due to intensity of illness or inability to take PO and Inpatient level of care appropriate due to severity of illness   Dispo: The patient is from: Home              Anticipated d/c is to: Home              Patient currently is not medically stable to d/c.   Difficult to place patient No  Nutritional Assessment:  The patient's BMI is: Body mass index is 33.45 kg/m.Marland Kitchen  Seen by dietician.  I agree with the assessment and plan as outlined below:  Nutrition Status: Nutrition Problem: Predicted suboptimal nutrient  intake Etiology: decreased appetite Signs/Symptoms: other (comment) (per MST report) Interventions: MVI,Glucerna shake  .Consultants:   None  Procedures:   See below  Antimicrobials:   None   Subjective: Patient seen and evaluated today with ongoing symptoms of diarrhea with 3-4 loose stools daily. Imodium has been started today.  Objective: Vitals:   01/18/21 1311 01/18/21 2135 01/19/21 0542 01/19/21 1000  BP: (!) 103/42 (!) 105/52 (!) 95/52 (!) 118/46  Pulse: 81 88 84 94  Resp: 16 19 18    Temp: 98.5 F (36.9 C) 97.9 F (36.6 C) 98.9 F (37.2 C) 99 F (37.2 C)  TempSrc: Oral  Oral Oral  SpO2: 97% 97% 94% 95%  Weight:      Height:        Intake/Output Summary (Last 24 hours) at 01/19/2021 1205 Last data filed at 01/19/2021 0900 Gross per 24 hour  Intake 1519.03 ml  Output 500 ml  Net 1019.03 ml   Filed Weights   01/16/21 1724  Weight: 91.2 kg    Examination:  General exam: Appears calm and comfortable  Respiratory system: Clear to auscultation. Respiratory effort normal. Cardiovascular system: S1 & S2 heard, RRR.  Gastrointestinal system: Abdomen is soft Central nervous system: Alert and awake Extremities: No edema Skin: No significant lesions noted Psychiatry: Flat affect.    Data Reviewed: I have personally reviewed following labs and imaging studies  CBC: Recent Labs  Lab 01/16/21 1816 01/17/21 0510  WBC 12.4* 12.7*  NEUTROABS 7.0 5.8  HGB 16.0* 14.9  HCT 46.6* 43.8  MCV 96.9 98.9  PLT 421* 272   Basic Metabolic Panel: Recent Labs  Lab 01/16/21 1816 01/17/21 0510 01/18/21 0525  NA 134* 138 145  K 2.9* 2.8* 3.6  CL 98 105 114*  CO2 22 21* 24  GLUCOSE 140* 90 71  BUN 16 11 7*  CREATININE 1.05* 0.87 0.81  CALCIUM 10.0 8.8* 9.2  MG 1.9 1.6* 1.7   GFR: Estimated Creatinine Clearance: 81.4 mL/min (by C-G formula based on SCr of 0.81 mg/dL). Liver Function Tests: Recent Labs  Lab 01/16/21 1816 01/17/21 0510  AST 34 27  ALT  29 25  ALKPHOS 89 78  BILITOT 0.7 0.5  PROT 8.0 7.1  ALBUMIN 4.1 3.6   Recent Labs  Lab 01/16/21 1816  LIPASE 86*   No results for input(s): AMMONIA in the last 168 hours. Coagulation Profile: No results for input(s): INR, PROTIME in the last 168 hours. Cardiac Enzymes: No results for input(s): CKTOTAL, CKMB, CKMBINDEX, TROPONINI in the last 168 hours. BNP (last 3 results) No results for input(s): PROBNP in the last 8760 hours. HbA1C: Recent Labs    01/17/21 0509  HGBA1C 6.0*   CBG: Recent Labs  Lab 01/18/21 1632 01/18/21 1709 01/18/21 2138 01/19/21 0750 01/19/21 1109  GLUCAP 63* 72 114* 86 186*   Lipid Profile: No results for input(s): CHOL, HDL, LDLCALC, TRIG, CHOLHDL, LDLDIRECT in the last 72 hours. Thyroid Function Tests: Recent Labs    01/16/21 1816  TSH 1.835   Anemia Panel: No results for input(s): VITAMINB12, FOLATE, FERRITIN, TIBC, IRON, RETICCTPCT in the last 72 hours. Sepsis Labs: No results for input(s): PROCALCITON, LATICACIDVEN in the last 168 hours.  Recent Results (from the past 240 hour(s))  C Difficile Quick Screen w PCR reflex     Status: None   Collection Time: 01/16/21 10:40 PM   Specimen: STOOL  Result Value Ref Range Status   C Diff antigen NEGATIVE NEGATIVE Final   C Diff toxin NEGATIVE NEGATIVE Final   C Diff interpretation No C. difficile detected.  Final    Comment: Performed at [facility], [address], [place], [place] [address]  Resp Panel by RT-PCR (Flu A&B, Covid) Nasopharyngeal Swab     Status: None   Collection Time: 01/17/21 12:50 AM   Specimen: Nasopharyngeal Swab; Nasopharyngeal(NP) swabs in vial transport medium  Result Value Ref Range Status   SARS Coronavirus 2 by RT PCR NEGATIVE NEGATIVE Final    Comment: (NOTE) SARS-CoV-2 target nucleic acids are NOT DETECTED.  The SARS-CoV-2 RNA is generally detectable in upper respiratory specimens during the acute phase of infection. The lowest concentration of  SARS-CoV-2 viral copies this assay can detect is 138 copies/mL. A negative result does not preclude SARS-Cov-2 infection and should not be used as the sole basis for treatment or other patient management decisions. A negative result may occur with  improper specimen collection/handling, submission of specimen other than nasopharyngeal swab, presence of viral mutation(s) within the areas targeted by this assay, and inadequate number of viral copies(<138 copies/mL). A negative result must be combined with clinical observations, patient history, and epidemiological information. The expected result is Negative.  Fact Sheet for Patients:  EntrepreneurPulse.com.au  Fact Sheet for Healthcare Providers:  IncredibleEmployment.be  This test is no t yet approved or cleared by the [place] FDA and  has been authorized for detection and/or diagnosis of SARS-CoV-2 by FDA under an Emergency Use Authorization (EUA). This EUA will remain  in effect (meaning this test can be used) for the duration of  the COVID-19 declaration under Section 564(b)(1) of the Act, 21 U.S.C.section 360bbb-3(b)(1), unless the authorization is terminated  or revoked sooner.       Influenza A by PCR NEGATIVE NEGATIVE Final   Influenza B by PCR NEGATIVE NEGATIVE Final    Comment: (NOTE) The Xpert Xpress SARS-CoV-2/FLU/RSV plus assay is intended as an aid in the diagnosis of influenza from Nasopharyngeal swab specimens and should not be used as a sole basis for treatment. Nasal washings and aspirates are unacceptable for Xpert Xpress SARS-CoV-2/FLU/RSV testing.  Fact Sheet for Patients: EntrepreneurPulse.com.au  Fact Sheet for Healthcare Providers: IncredibleEmployment.be  This test is not yet approved or cleared by the [place] FDA and has been authorized for detection and/or diagnosis of SARS-CoV-2 by FDA under an Emergency Use  Authorization (EUA). This EUA will remain in effect (meaning this test can be used) for the duration of the COVID-19 declaration under Section 564(b)(1) of the Act, 21 U.S.C. section 360bbb-3(b)(1), unless the authorization is terminated or revoked.  Performed at [facility], [address], [place], [place] [address]          Radiology Studies: ECHOCARDIOGRAM COMPLETE  Result Date: 01/17/2021    ECHOCARDIOGRAM REPORT   Patient Name:   DANARIA LARSEN Date of Exam: 01/17/2021 Medical Rec #:  [id number]         Height:       65.0 in Accession #:    [id number]        Weight:       201.0 lb Date of Birth:  04-07-1961        BSA:          1.982 m Patient Age:    89 years          BP:           109/55 mmHg Patient Gender: F                 HR:           90 bpm. Exam Location:  [facility] Procedure: 2D Echo, Cardiac Doppler and Color Doppler Indications:    Syncope R55  History:        Patient has prior history of Echocardiogram examinations, most                 recent 06/18/2019. Risk Factors:Diabetes. Sleep apnea.                 Palpitations.                 Hypothyroidism.  Sonographer:    BW Referring Phys: [id number] ASIA B [place]  1. Left ventricular ejection fraction, by estimation, is 60 to 65%. The left ventricle has normal function. The left ventricle has no regional wall motion abnormalities. Left ventricular diastolic parameters were normal.  2. Right ventricular systolic function is normal. The right ventricular size is normal. Tricuspid regurgitation signal is inadequate for assessing PA pressure.  3. The mitral valve is normal in structure. Trivial mitral valve regurgitation. No evidence of mitral stenosis.  4. The aortic valve is normal in structure. Aortic valve regurgitation is not visualized. No aortic stenosis is present.  5. The inferior vena cava is normal in size with greater than 50% respiratory variability, suggesting right atrial pressure of 3 mmHg. FINDINGS   Left Ventricle: Left ventricular ejection fraction, by estimation, is 60 to 65%. The left ventricle has normal function. The left ventricle has no regional wall motion abnormalities.  The left ventricular internal cavity size was normal in size. There is  no left ventricular hypertrophy. Left ventricular diastolic parameters were normal. Right Ventricle: The right ventricular size is normal. No increase in right ventricular wall thickness. Right ventricular systolic function is normal. Tricuspid regurgitation signal is inadequate for assessing PA pressure. Left Atrium: Left atrial size was normal in size. Right Atrium: Right atrial size was normal in size. Pericardium: There is no evidence of pericardial effusion. Mitral Valve: The mitral valve is normal in structure. Trivial mitral valve regurgitation. No evidence of mitral valve stenosis. Tricuspid Valve: The tricuspid valve is normal in structure. Tricuspid valve regurgitation is trivial. No evidence of tricuspid stenosis. Aortic Valve: The aortic valve is normal in structure. Aortic valve regurgitation is not visualized. No aortic stenosis is present. Pulmonic Valve: The pulmonic valve was normal in structure. Pulmonic valve regurgitation is not visualized. No evidence of pulmonic stenosis. Aorta: The aortic root is normal in size and structure. Venous: The inferior vena cava is normal in size with greater than 50% respiratory variability, suggesting right atrial pressure of 3 mmHg. IAS/Shunts: No atrial level shunt detected by color flow Doppler.  LEFT VENTRICLE PLAX 2D LVIDd:         3.90 cm  Diastology LVIDs:         2.30 cm  LV e' medial:    10.90 cm/s LV PW:         0.80 cm  LV E/e' medial:  9.1 LV IVS:        0.90 cm  LV e' lateral:   12.00 cm/s LVOT diam:     1.80 cm  LV E/e' lateral: 8.2 LV SV:         60 LV SV Index:   30 LVOT Area:     2.54 cm  RIGHT VENTRICLE RV S prime:     13.60 cm/s TAPSE (M-mode): 2.4 cm LEFT ATRIUM             Index       RIGHT  ATRIUM           Index LA diam:        3.40 cm 1.72 cm/m  RA Area:     11.80 cm LA Vol (A2C):   38.8 ml 19.57 ml/m RA Volume:   27.30 ml  13.77 ml/m LA Vol (A4C):   36.2 ml 18.26 ml/m LA Biplane Vol: 37.5 ml 18.92 ml/m  AORTIC VALVE LVOT Vmax:   110.00 cm/s LVOT Vmean:  66.200 cm/s LVOT VTI:    0.234 m  AORTA Ao Root diam: 2.90 cm MITRAL VALVE MV Area (PHT): 4.77 cm    SHUNTS MV Decel Time: 159 msec    Systemic VTI:  0.23 m MV E velocity: 99.00 cm/s  Systemic Diam: 1.80 cm MV A velocity: 71.60 cm/s MV E/A ratio:  1.38 Cherlynn Kaiser MD Electronically signed by Cherlynn Kaiser MD Signature Date/Time: 01/17/2021/1:34:39 PM    Final         Scheduled Meds: . aspirin EC  81 mg Oral Daily  . feeding supplement (GLUCERNA SHAKE)  237 mL Oral TID BM  . gabapentin  300 mg Oral TID  . heparin  5,000 Units Subcutaneous Q8H  . insulin aspart  0-15 Units Subcutaneous TID WC  . insulin aspart  0-5 Units Subcutaneous QHS  . insulin detemir  20 Units Subcutaneous QHS  . magnesium oxide  800 mg Oral Daily  . meclizine  12.5 mg Oral TID  .  melatonin  9 mg Oral QHS  . metoprolol tartrate  37.5 mg Oral BID  . multivitamin with minerals  1 tablet Oral Daily  . oxyCODONE  10 mg Oral Q12H  . pantoprazole  40 mg Oral Daily  . psyllium  1 packet Oral Daily  . rosuvastatin  20 mg Oral Daily  . temazepam  15 mg Oral QHS  . traZODone  100 mg Oral QHS  . venlafaxine XR  75 mg Oral Q breakfast   Continuous Infusions: . sodium chloride 75 mL/hr at 01/19/21 0130     LOS: 1 day    Time spent: 35 minutes    Hassel Uphoff Darleen Crocker, DO [facility]  If 7PM-7AM, please contact night-coverage [email or web address] 01/19/2021, 12:05 PM

## 2021-01-19 NOTE — Progress Notes (Signed)
Initial Nutrition Assessment  DOCUMENTATION CODES:   Obesity unspecified  INTERVENTION:  Glucerna Shake po TID, each supplement provides 220 kcal and 10 grams of protein  MVI with minerals daily  NUTRITION DIAGNOSIS:   Predicted suboptimal nutrient intake related to decreased appetite as evidenced by other (comment) (per MST report).    GOAL:   Patient will meet greater than or equal to 90% of their needs    MONITOR:   PO intake,Supplement acceptance,Labs,I & O's,Weight trends  REASON FOR ASSESSMENT:   Malnutrition Screening Tool    ASSESSMENT:   Pt admitted with syncope and chronic diarrhea. PMH includes type 2 DM and GERD.  Cdiff negative. Pt has been consistently hypokalemic 2/2 diarrhea despite several attempts at repletion. RN also notes pt with orthostatic hypotension. MD notes pt to discharge home once BP and potassium are stable.  Pt unavailable at time of RD visit.   PO Intake: 50-100% x 6 recorded meals (79% average meal intake)  UOP: 920ml x24 hours  Medications: SSI TID w/ meals and bedtime, 20 units levemir daily, mag-ox, metamucil Labs reviewed. K+ WNL.  CBGs 63-72-114  Diet Order:   Diet Order            Diet heart healthy/carb modified Room service appropriate? Yes; Fluid consistency: Thin  Diet effective now                 EDUCATION NEEDS:   No education needs have been identified at this time  Skin:  Skin Assessment: Reviewed RN Assessment  Last BM:  4/17  Height:   Ht Readings from Last 1 Encounters:  01/16/21 5\' 5"  (1.651 m)    Weight:   Wt Readings from Last 1 Encounters:  01/16/21 91.2 kg    BMI:  Body mass index is 33.45 kg/m.  Estimated Nutritional Needs:   Kcal:  1800-2000  Protein:  100-115 grams  Fluid:  >1.8L    Larkin Ina, MS, RD, LDN RD pager number and weekend/on-call pager number located in [place].

## 2021-01-19 NOTE — TOC Progression Note (Signed)
Transition of Care [facility]) - Progression Note    Patient Details  Name: Annette [name] MRN: [id number] Date of Birth: 06-03-1961  Transition of [facility]) CM/SW Contact  Salome Arnt, [place] Phone Number: 01/19/2021, 8:30 AM  Clinical Narrative:  LCSW noted PT recommendation for outpatient PT. Discussed with pt who refuses due to recent lumbar surgery and copays. TOC will continue to follow.           Expected Discharge Plan and Services                                                 Social Determinants of Health (SDOH) Interventions    Readmission Risk Interventions No flowsheet data found.

## 2021-01-20 DIAGNOSIS — Z860100 Personal history of colon polyps, unspecified: Secondary | ICD-10-CM

## 2021-01-20 DIAGNOSIS — R197 Diarrhea, unspecified: Secondary | ICD-10-CM

## 2021-01-20 LAB — GASTROINTESTINAL PANEL BY PCR, STOOL (REPLACES STOOL CULTURE)

## 2021-01-20 LAB — BASIC METABOLIC PANEL
Anion gap: 9 (ref 5–15)
BUN: 9 mg/dL (ref 8–23)
CO2: 23 mmol/L (ref 22–32)
Calcium: 9 mg/dL (ref 8.9–10.3)
Chloride: 112 mmol/L — ABNORMAL HIGH (ref 98–111)
Creatinine, Ser: 0.76 mg/dL (ref 0.44–1.00)
GFR, Estimated: >60 mL/min (ref >60)
Glucose, Bld: 108 mg/dL — ABNORMAL HIGH (ref 70–99)
Potassium: 3 mmol/L — ABNORMAL LOW (ref 3.5–5.1)
Sodium: 144 mmol/L (ref 135–145)

## 2021-01-20 LAB — CBC
HCT: 39.4 % (ref 36.0–46.0)
Hemoglobin: 12.8 g/dL (ref 12.0–15.0)
MCH: 33 pg (ref 26.0–34.0)
MCHC: 32.5 g/dL (ref 30.0–36.0)
MCV: 101.5 fL — ABNORMAL HIGH (ref 80.0–100.0)
Platelets: 314 10*3/uL (ref 150–400)
RBC: 3.88 MIL/uL (ref 3.87–5.11)
RDW: 13.6 % (ref 11.5–15.5)
WBC: 12 10*3/uL — ABNORMAL HIGH (ref 4.0–10.5)
nRBC: 0 % (ref 0.0–0.2)

## 2021-01-20 LAB — GLUCOSE, CAPILLARY
Glucose-Capillary: 93 mg/dL (ref 70–99)
Glucose-Capillary: 152 mg/dL — ABNORMAL HIGH (ref 70–99)
Glucose-Capillary: 154 mg/dL — ABNORMAL HIGH (ref 70–99)
Glucose-Capillary: 151 mg/dL — ABNORMAL HIGH (ref 70–99)

## 2021-01-20 LAB — MAGNESIUM: Magnesium: 1.6 mg/dL — ABNORMAL LOW (ref 1.7–2.4)

## 2021-01-20 MED ORDER — LACTATED RINGERS IV BOLUS
1000.0000 mL | Freq: Once | INTRAVENOUS | Status: AC
  Administered 2021-01-20: 1000 mL via INTRAVENOUS

## 2021-01-20 MED ORDER — POTASSIUM CHLORIDE CRYS ER 20 MEQ PO TBCR
40.0000 meq | EXTENDED_RELEASE_TABLET | Freq: Two times a day (BID) | ORAL | Status: DC
  Administered 2021-01-20 – 2021-01-21 (×4): 40 meq via ORAL
  Filled 2021-01-20 (×4): qty 2

## 2021-01-20 MED ORDER — DULAGLUTIDE 1.5 MG/0.5ML ~~LOC~~ SOAJ: 1.5000 mg | SUBCUTANEOUS | Status: DC

## 2021-01-20 MED ORDER — MAGNESIUM SULFATE 2 GM/50ML IV SOLN
2.0000 g | Freq: Once | INTRAVENOUS | Status: AC
  Administered 2021-01-20: 2 g via INTRAVENOUS
  Filled 2021-01-20: qty 50

## 2021-01-20 NOTE — Consult Note (Addendum)
@LOGO @   Referring Provider: [facility]  Primary Care Physician:  Fayrene Helper, MD Primary Gastroenterologist:  Dr. Jenetta Downer  Date of Admission: 01/16/21  Date of Consultation: 01/20/21  Reason for Consultation:  Persistent Diarrhea  HPI:  Annette [name] is a 60 y.o. year old female with past medical history of depression, diabetes, GERD, hyperlipidemia, hypothyroidism, pituitary insufficiency, IBS-mixed type, OSA, history of breast cancer in remission, recent lumbar surgery who presented to the emergency room 4/15 with chief complaint of leg pain since her surgery.  Decreased appetite since surgery and reported dizziness and syncope as well.  Also reported last normal bowel movement was before surgery and since surgery, she had diarrhea with 3-6 BMs daily.  ED course: Temp 98.3, heart rate 92-102, respiratory rate 13-28, blood pressure 102/45, satting 92% Mild leukocytosis with a white blood cell count of 12.4, hemoglobin 16.0 Chemistry panel reveals a hypokalemia of 2.9, hypochloremia 98, hyperglycemia 140 C. difficile negative CT abdomen pelvis shows no pulmonary embolus, multiple prominent bilateral hilar lymph nodes without definite lymphadenopathy, interval development of 1.4 cm right adrenal gland nodule, stable 2.2 cm right adrenal gland nodule, s/p left adrenalectomy, recommended CT adrenal protocol for further evaluation. Troponin negative. D-dimer 1.26 TSH 1.35 She remained orthostatic after fluid boluses.  To further evaluate syncope, she underwent ultrasound carotid bilateral which revealed less than 50% stenosis in internal carotid arteries bilaterally.  Patent vertebral arteries.  Echocardiogram with no significant findings.  EF 60-65%.  No aortic stenosis.  Trivial mitral valve regurgitation.   Regarding diarrhea, GI pathogen panel is now in process and she is on Imodium 4 mg twice daily which was started on 4/18.   Today:  Since March 4th, she has had  persistent diarrhea. 3-6 BMs daily. Watery. Had a little brbpr on toilet tissue yesterday x1. This is new. No black stools. No BM today. Had a pancake and chocolate pudding for breakfast. No BM today as of 12:30 pm. Had 5 BMs yesterday. At home, she was taking 6-8 BMs per day. Intermittent lower abdominal cramping prior to BMs that improves thereafter.   No recent antibiotics. No known sick contacts. No well water. No travel.  No recent medication changes.  Prior to surgery, she had intermittent diarrhea that would last a couple of days at a time and responded very well to Imodium.  Typically with 2-3 BMs per day in the setting of diarrhea.  Previously prescribed Lomotil which worked well for her.  Stays away from fried foods. Little dairy.   Had vomiting at home. No hematemesis. StartedoOver the last week, the thought of eating was making her nauseated. So she wasn't eating much. Appetite has improved. 2 days prior to presenting to the ED. Chronic history of nausea, but worse since her surgery. No vomiting since presenting to the hospital. No dizziness or syncopal episodes. Patient states they stopped the triamterene/hydrochlorothiazide since admission which was prescribed for Mnire's disease.  She is worried about not having this medication.  Discussed the fact that this is being held due to hypotension.    Reports 30 pound weight loss since her surgery.  91 kg in the ED.  100 kg at her office visit in September 2021 with Dr. Jenetta Downer. 20 lb weight loss.   Constant pain from hips to lower legs bilaterally since surgery, intermittent burning and numbness in upper thighs since surgery.    Last EGD 2017: Normal examined esophagus s/p empiric dilation, 2 cm hiatal hernia, no other abnormalities. Last colonoscopy  September 2021: 15 mm polyp in the cecum removed with combination of hot snare and cold snare in piecemeal fashion, 210-12 mm polyps in the rectum and descending colon resected and  retrieved, no other significant abnormalities.  Biopsies were taken to evaluate for microscopic colitis.  Pathology revealed cecal polyp was sessile serrated polyp, other polyps were tubular adenomas, random colon biopsies with polypoid colonic mucosa with a prominent lymphoid aggregate, negative for dysplasia. Recommended repeat colonoscopy in 6 months.  Past Medical History:  Diagnosis Date  . Arthritis    Phreesia 10/14/2020  . Blood transfusion without reported diagnosis    Phreesia 10/14/2020  . Breast cancer ([place]) 2007   History of  XRT, onTamoxifen  . Bruises easily   . Cancer ([place])    Phreesia 10/14/2020  . Depression   . Depression    Phreesia 10/14/2020  . Diabetes mellitus    prediabetic  . Diabetes mellitus without complication ([place])    Phreesia 10/14/2020  . GERD (gastroesophageal reflux disease)   . Hypertriglyceridemia   . Hypothyroidism    following chemo amnd radiaition for breast cancer, needed replacement short term  . Hypothyroidism (acquired)    replaced x 1 year  . IBS (irritable bowel syndrome)   . Menieres disease    Controlled with triamterene   . Migraines   . Obesity   . Palpitations   . 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[place])   . Sleep apnea 2010   Problems with CPAP    Past Surgical History:  Procedure Laterality Date  . ABDOMINAL EXPOSURE N/A 12/03/2020   Procedure: ABDOMINAL EXPOSURE;  Surgeon: Rosetta Posner, MD;  Location: [facility] OR;  Service: Vascular;  Laterality: N/A;  . ABDOMINAL HYSTERECTOMY  1998   Benign, fibroids  . 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 ANTERIOR LUMBAR FUSION N/A 12/03/2020   Procedure: Lumbar Four-Five Lumbar Five Sacral One Anterior lumbar interbody fusion;  Surgeon: Ashok Pall, MD;  Location: [place];  Service: Neurosurgery;  Laterality: N/A;  . APPENDECTOMY  06/12/08  . BIOPSY  07/02/2020   Procedure: BIOPSY;  Surgeon: Harvel Quale, MD;  [facility];  Service:  Gastroenterology;;  . BREAST LUMPECTOMY Left 2008  . BREAST RECONSTRUCTION     Left reconstructive  . BREAST SURGERY  2007   Left lumpectomy  . BREAST SURGERY     Mammosite - right side  . CHOLECYSTECTOMY  1985  . COLONOSCOPY WITH PROPOFOL N/A 07/02/2020   Procedure: COLONOSCOPY WITH PROPOFOL;  Surgeon: Harvel Quale, MD;  [facility];  Service: Gastroenterology;  Laterality: N/A;  1045  . ESOPHAGEAL DILATION  12/19/2015   Procedure: ESOPHAGEAL DILATION;  Surgeon: Rogene Houston, MD;  [facility];  Service: Endoscopy;;  . ESOPHAGOGASTRODUODENOSCOPY N/A 12/19/2015   Procedure: ESOPHAGOGASTRODUODENOSCOPY (EGD);  Surgeon: Rogene Houston, MD;  [facility];  Service: Endoscopy;  Laterality: N/A;  11:40  . POLYPECTOMY  07/02/2020   Procedure: POLYPECTOMY;  Surgeon: Montez Morita, Quillian Quince, MD;  [facility];  Service: Gastroenterology;;  . SPLENECTOMY, TOTAL  6/73/4193   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  . THYROIDECTOMY, PARTIAL  11/05/2010   Benign disease    Prior to Admission medications   Medication Sig Start Date End Date Taking? Authorizing Provider  CLOTRIMAZOLE EX Apply 1 application topically daily as needed (itching an breast). 15 %   Yes [provider]  cyclobenzaprine (FLEXERIL) 10 MG tablet Take 1  tablet (10 mg total) by mouth 3 (three) times daily as needed for muscle spasms. 12/06/20  Yes Pool, Mallie Mussel, MD  Dulaglutide (TRULICITY) 1.5 PZ/0.2HE SOPN Inject 0.5 mLs (1.5 mg total) into the skin once a week. 04/17/20  Yes Philemon Kingdom, MD  ergocalciferol (VITAMIN D2) 1.25 MG (50000 UT) capsule Take 50,000 Units by mouth once a week.   Yes [provider]  fluconazole (DIFLUCAN) 150 MG tablet TAKE ONE TABLET ONCE DAILY , AS NEEDED, FOR VAGINAL ITCH ASSOCIATED WITH ANTIBIOTIC USE Patient taking differently: Take 150 mg by mouth See admin instructions. TAKE ONE TABLET ONCE DAILY , AS  NEEDED, FOR VAGINAL ITCH ASSOCIATED WITH ANTIBIOTIC USE 10/07/20  Yes Fayrene Helper, MD  insulin degludec (TRESIBA FLEXTOUCH) 200 UNIT/ML FlexTouch Pen Inject 50 Units into the skin at bedtime. Patient taking differently: Inject 46-48 Units into the skin at bedtime. 01/12/21  Yes Philemon Kingdom, MD  LORazepam (ATIVAN) 1 MG tablet Take 1 mg by mouth daily as needed (Nausea).   Yes [provider]  Melatonin 10 MG TABS Take 1 tablet by mouth daily.   Yes [provider]  Metoprolol Tartrate 37.5 MG TABS TAKE 1 TABLET BY MOUTH TWICE DAILY 01/12/21  Yes Fayrene Helper, MD  naproxen (NAPROSYN) 500 MG tablet TAKE 1 TABLET(500 MG) BY MOUTH TWICE DAILY WITH A MEAL Patient taking differently: Take 500 mg by mouth 2 (two) times daily with a meal. 01/08/21  Yes Fayrene Helper, MD  omeprazole (PRILOSEC) 40 MG capsule Take 1 capsule (40 mg total) by mouth daily. 10/11/20  Yes Fayrene Helper, MD  ondansetron (ZOFRAN ODT) 4 MG disintegrating tablet Take 1 tablet (4 mg total) by mouth every 8 (eight) hours as needed for nausea. 12/19/20  Yes Noemi Chapel, MD  oxyCODONE-acetaminophen (PERCOCET/ROXICET) 5-325 MG tablet Take 1 tablet by mouth every 8 (eight) hours as needed for severe pain. 01/16/21  Yes Couture, Cortni S, PA-C  predniSONE (DELTASONE) 10 MG tablet Take 1 tablet (10 mg total) by mouth daily for 5 days. 01/16/21 01/21/21 Yes Couture, Cortni S, PA-C  rosuvastatin (CRESTOR) 20 MG tablet Take 1 tablet (20 mg total) by mouth daily. 01/12/21  Yes Fayrene Helper, MD  temazepam (RESTORIL) 15 MG capsule Take 1 capsule (15 mg total) by mouth at bedtime. 11/03/20  Yes Fayrene Helper, MD  traZODone (DESYREL) 100 MG tablet Take 1 tablet (100 mg total) by mouth at bedtime. 01/12/21  Yes Fayrene Helper, MD  triamterene-hydrochlorothiazide (MAXZIDE-25) 37.5-25 MG tablet Take 1 tablet by mouth daily.   Yes [provider]  venlafaxine XR (EFFEXOR-XR) 75 MG 24 hr  capsule TAKE 1 CAPSULE(75 MG) BY MOUTH DAILY Patient taking differently: Take 75 mg by mouth daily with breakfast. 01/05/21  Yes Fayrene Helper, MD  aspirin 81 MG EC tablet Take 81 mg by mouth daily. Patient not taking: Reported on 01/16/2021    [provider]  gabapentin (NEURONTIN) 100 MG capsule Take 2 capsules (200 mg total) by mouth 3 (three) times daily. In three days, increase to 300 mg TID Patient not taking: No sig reported 02/27/77   Delora Fuel, MD  glucose blood test strip See admin instructions. 01/31/20   [provider]  Insulin Pen Needle (PEN NEEDLES) 31G X 6 MM MISC See admin instructions. 01/31/20   [provider]  [facility] VERIO test strip USE AS DIRECTED FOUR TIMES DAILY 08/22/20   Fayrene Helper, MD  oxyCODONE (OXYCONTIN) 10 mg 12 hr  tablet Take 1 tablet (10 mg total) by mouth every 12 (twelve) hours. Patient not taking: No sig reported 12/06/20   Earnie Larsson, MD    Current Facility-Administered Medications  Medication Dose Route Frequency Provider Last Rate Last Admin  . 0.9 %  sodium chloride infusion   Intravenous Continuous Zierle-Ghosh, Asia B, DO 75 mL/hr at 01/19/21 2202 New Bag at 01/19/21 2202  . acetaminophen (TYLENOL) tablet 650 mg  650 mg Oral Q6H PRN Zierle-Ghosh, Asia B, DO   650 mg at 01/17/21 1635   Or  . acetaminophen (TYLENOL) suppository 650 mg  650 mg Rectal Q6H PRN Zierle-Ghosh, Asia B, DO      . aspirin EC tablet 81 mg  81 mg Oral Daily Zierle-Ghosh, Asia B, DO   81 mg at 01/20/21 0941  . cyclobenzaprine (FLEXERIL) tablet 5 mg  5 mg Oral BID PRN Cristal Deer, MD   5 mg at 01/19/21 2205  . feeding supplement (GLUCERNA SHAKE) (GLUCERNA SHAKE) liquid 237 mL  237 mL Oral TID BM Shah, Pratik D, DO   237 mL at 01/20/21 0942  . gabapentin (NEURONTIN) capsule 300 mg  300 mg Oral TID Zierle-Ghosh, Asia B, DO   300 mg at 01/20/21 0940  . heparin injection 5,000 Units  5,000 Units Subcutaneous Q8H Zierle-Ghosh, Asia B, DO    5,000 Units at 01/20/21 0630  . insulin aspart (novoLOG) injection 0-15 Units  0-15 Units Subcutaneous TID WC Zierle-Ghosh, Asia B, DO   3 Units at 01/19/21 1159  . insulin aspart (novoLOG) injection 0-5 Units  0-5 Units Subcutaneous QHS Zierle-Ghosh, Asia B, DO      . insulin detemir (LEVEMIR) injection 20 Units  20 Units Subcutaneous QHS Cristal Deer, MD      . loperamide (IMODIUM) capsule 4 mg  4 mg Oral BID Manuella Ghazi, Pratik D, DO   4 mg at 01/20/21 0940  . LORazepam (ATIVAN) tablet 1 mg  1 mg Oral Daily PRN Zierle-Ghosh, Asia B, DO      . meclizine (ANTIVERT) tablet 12.5 mg  12.5 mg Oral TID Zierle-Ghosh, Asia B, DO   12.5 mg at 01/20/21 0941  . melatonin tablet 9 mg  9 mg Oral QHS Cristal Deer, MD   9 mg at 01/19/21 2154  . metoprolol tartrate (LOPRESSOR) tablet 37.5 mg  37.5 mg Oral BID Zierle-Ghosh, Asia B, DO   37.5 mg at 01/20/21 0941  . morphine 2 MG/ML injection 2 mg  2 mg Intravenous Q2H PRN Zierle-Ghosh, Asia B, DO   2 mg at 01/17/21 0604  . multivitamin with minerals tablet 1 tablet  1 tablet Oral Daily Manuella Ghazi, Pratik D, DO   1 tablet at 01/20/21 0941  . ondansetron (ZOFRAN) tablet 4 mg  4 mg Oral Q6H PRN Zierle-Ghosh, Asia B, DO       Or  . ondansetron (ZOFRAN) injection 4 mg  4 mg Intravenous Q6H PRN Zierle-Ghosh, Asia B, DO      . oxyCODONE (Oxy IR/ROXICODONE) immediate release tablet 5 mg  5 mg Oral Q4H PRN Zierle-Ghosh, Asia B, DO   5 mg at 01/18/21 0848  . oxyCODONE (OXYCONTIN) 12 hr tablet 10 mg  10 mg Oral Q12H Zierle-Ghosh, Asia B, DO   10 mg at 01/20/21 0940  . pantoprazole (PROTONIX) EC tablet 40 mg  40 mg Oral Daily Zierle-Ghosh, Asia B, DO   40 mg at 01/20/21 0941  . potassium chloride SA (KLOR-CON) CR tablet 40 mEq  40 mEq Oral BID Manuella Ghazi, Pratik  D, DO   40 mEq at 01/20/21 0941  . rosuvastatin (CRESTOR) tablet 20 mg  20 mg Oral Daily Zierle-Ghosh, Asia B, DO   20 mg at 01/20/21 0940  . temazepam (RESTORIL) capsule 15 mg  15 mg Oral QHS Zierle-Ghosh, Asia B, DO   15 mg  at 01/19/21 2154  . traZODone (DESYREL) tablet 100 mg  100 mg Oral QHS Zierle-Ghosh, Asia B, DO   100 mg at 01/19/21 2154  . venlafaxine XR (EFFEXOR-XR) 24 hr capsule 75 mg  75 mg Oral Q breakfast Zierle-Ghosh, Asia B, DO   75 mg at 01/20/21 1540    Allergies as of 01/16/2021 - Review Complete 01/16/2021  Allergen Reaction Noted  . Penicillins Swelling   . Metformin and related Diarrhea and Nausea Only 03/30/2015  . Wound dressing adhesive Itching and Rash 09/09/2020  . Empagliflozin Other (See Comments) 02/29/2020  . Penicillin g Other (See Comments) 02/29/2020  . Statins Other (See Comments) 07/15/2014  . Zetia [ezetimibe] Other (See Comments) 11/21/2014    Family History  Problem Relation Age of Onset  . Diabetes Mother   . Arrhythmia Mother   . Heart disease Father   . Hyperlipidemia Father   . Arrhythmia Father   . Cancer Paternal Grandmother        Breast  . Diabetes Sister   . Diabetes Brother   . Heart disease Brother     Social History   Socioeconomic History  . Marital status: Married    Spouse name: Alroy Dust  . Number of children: 2  . Years of education: Not on file  . Highest education level: Not on file  Occupational History  . Not on file  Tobacco Use  . Smoking status: Current Every Day Smoker    Packs/day: 0.50    Years: 40.00    Pack years: 20.00    Types: Cigarettes  . Smokeless tobacco: Never Used  . Tobacco comment: 1/2 pack a day  Vaping Use  . Vaping Use: Never used  Substance and Sexual Activity  . Alcohol use: No    Alcohol/week: 0.0 standard drinks    Comment: Encouraged to quit smoking. She has tried the nicotone gum and patches but did not help  . Drug use: No  . Sexual activity: Yes    Birth control/protection: Surgical  Other Topics Concern  . Not on file  Social History Narrative  . Not on file   Social Determinants of Health   Financial Resource Strain: Medium Risk  . Difficulty of Paying Living Expenses: Somewhat hard   Food Insecurity: No Food Insecurity  . Worried About Charity fundraiser in the Last Year: Never true  . Ran Out of Food in the Last Year: Never true  Transportation Needs: No Transportation Needs  . Lack of Transportation (Medical): No  . Lack of Transportation (Non-Medical): No  Physical Activity: Inactive  . Days of Exercise per Week: 0 days  . Minutes of Exercise per Session: 0 min  Stress: Stress Concern Present  . Feeling of Stress : To some extent  Social Connections: Moderately Integrated  . Frequency of Communication with Friends and Family: Three times a week  . Frequency of Social Gatherings with Friends and Family: Three times a week  . Attends Religious Services: More than 4 times per year  . Active Member of Clubs or Organizations: No  . Attends Archivist Meetings: Never  . Marital Status: Married  Human resources officer Violence: Not At Risk  .  Fear of Current or Ex-Partner: No  . Emotionally Abused: No  . Physically Abused: No  . Sexually Abused: No    Review of Systems: Gen: Denies fever, chills, cold or flulike symptoms. CV: Denies chest pain or palpitations. Resp: Denies shortness of breath or cough. GI: See HPI Derm: Denies rash Heme: See HPI  Physical Exam: Vital signs in last 24 hours: Temp:  [98.2 F (36.8 C)-99.1 F (37.3 C)] 99.1 F (37.3 C) (04/19 0542) Pulse Rate:  [80-96] 80 (04/19 0542) Resp:  [16-20] 20 (04/19 0542) BP: (86-116)/(41-60) 86/41 (04/19 0542) SpO2:  [94 %-98 %] 95 % (04/19 0542) FiO2 (%):  [0 %] 0 % (04/18 1429) Last BM Date: 01/19/21 General:   Alert,  well-developed, well-nourished, pleasant and cooperative in NAD Head:  Normocephalic and atraumatic. Eyes:  Sclera clear, no icterus.   Conjunctiva pink. Ears:  Normal auditory acuity. Lungs:  Clear throughout to auscultation.   No wheezes, crackles, or rhonchi. No acute distress. Heart:  Regular rate and rhythm; no murmurs, clicks, rubs,  or gallops. Abdomen:  Soft  and nondistended. Minimal TTP in RUQ. Mild TTP in LUQ and LLQ. Healing vertical incision in LLQ. No masses, hepatosplenomegaly or hernias noted. Normal bowel sounds, without guarding, and without rebound.   Rectal:  Deferred.   Msk:  Symmetrical without gross deformities. Normal posture. Extremities:  With trace LE edema.  Neurologic:  Alert and  oriented x4;  grossly normal neurologically. Skin:  Intact without significant lesions or rashes. Psych: Normal mood and affect.  Intake/Output from previous day: 04/18 0701 - 04/19 0700 In: 600 [P.O.:600] Out: -  Intake/Output this shift: No intake/output data recorded.  Lab Results: Recent Labs    01/20/21 0552  WBC 12.0*  HGB 12.8  HCT 39.4  PLT 314   BMET Recent Labs    01/18/21 0525 01/20/21 0552  NA 145 144  K 3.6 3.0*  CL 114* 112*  CO2 24 23  GLUCOSE 71 108*  BUN 7* 9  CREATININE 0.81 0.76  CALCIUM 9.2 9.0    Impression: 60 year old female with past medical history of depression, diabetes, GERD, hyperlipidemia, hypothyroidism, pituitary insufficiency, IBS-mixed type, OSA, history of breast cancer in remission, recent lumbar surgery who presented to the emergency room 4/15 with persistent leg pain and diarrhea since lumbar surgery on 3/2.  She is scheduled for MRI of the lumbar spine tomorrow for further evaluation of her leg pain.  GI was consulted due to persistent diarrhea with hypotension and reports of syncope outpatient.   Diarrhea: Chronic history of IBS with intermittent diarrhea that typically responds well to Imodium.  Since her lumbar surgery, she reports persistent watery diarrhea with 3-6 BMs daily that has not been responsive to Imodium, weight loss (documented 20 lbs over the last 6 months), decreased appetite with increased nausea and a couple days of vomiting just prior to presenting to the hospital. Chronic associated intermittent lower abdominal cramping prior to bowel movements that resolves thereafter  without change. Also reports single episode of toilet tissue hematochezia yesterday. Denies melena.  Denies recent antibiotics, sick contacts, well water, or recent medication changes.  Colonoscopy in September 2021 with random colon biopsies negative for microscopic colitis.  CT A/P this admission with no evidence of colitis, pancreatic abnormality, or other acute intra-abdominal abnormality.  She did have prominent bilateral hilar lymph nodes with no definite lymphadenopathy and interval development of 1.4 cm right adrenal gland nodule with stable 2.2 cm right adrenal nodule.  C. difficile  testing negative.  GI pathogen panel is pending. Also with mild leukocytosis, hemoglobin wnl, and she is afebrile.  She was started on scheduled Imodium on 4/18 and notes improvement in diarrhea today.  No bowel movement as of 12:30 PM today.  She has not had any nausea or vomiting since hospital admission and she reports her appetite returned to normal after admission.  Notably, she also tells me she has not had any dizziness or presyncope since presenting to the hospital.  Differential for diarrhea is quite broad. Will need to rule out infectious diarrhea to start with. It is encouraging she has had improvement with imodium. May have also had an IBS flare, possible bile salt diarrhea, celiac disease. If initial testing is negative and diarrhea persist, will need to consider further testing as hypotension and syncope prior to admission may be related to volume depletion.   Toilet tissue hematochezia: Single episode of toilet tissue hematochezia in the setting of diarrhea.  Suspect benign anorectal source.  Hemoglobin remained stable.  Continue to monitor.  History of colonic adenomas: Last colonoscopy in September 2021 with large 15 mm sessile serrated polyp and two 10-12 mm tubular adenomas.  Recommended repeat colonoscopy in 6 months.  She is overdue for surveillance colonoscopy.  This will likely be arranged  outpatient.  Plan: 1.  Follow-up on GI pathogen panel.   2.  TTG IgA and IgA total. 3.  Continue Imodium scheduled. 4.  Could consider cholestyramine if needed. 5.  Monitor for return of rectal bleeding. 6.  She is due for surveillance colonoscopy.  Suspect this will be completed outpatient.    LOS: 2 days    01/20/2021, 11:34 AM   Aliene Altes, PA-C [facility]

## 2021-01-20 NOTE — H&P (View-Only) (Signed)
@LOGO @   Referring Provider: [facility]  Primary Care Physician:  Fayrene Helper, MD Primary Gastroenterologist:  Dr. Jenetta Downer  Date of Admission: 01/16/21  Date of Consultation: 01/20/21  Reason for Consultation:  Persistent Diarrhea  HPI:  Annette [name] is a 60 y.o. year old female with past medical history of depression, diabetes, GERD, hyperlipidemia, hypothyroidism, pituitary insufficiency, IBS-mixed type, OSA, history of breast cancer in remission, recent lumbar surgery who presented to the emergency room 4/15 with chief complaint of leg pain since her surgery.  Decreased appetite since surgery and reported dizziness and syncope as well.  Also reported last normal bowel movement was before surgery and since surgery, she had diarrhea with 3-6 BMs daily.  ED course: Temp 98.3, heart rate 92-102, respiratory rate 13-28, blood pressure 102/45, satting 92% Mild leukocytosis with a white blood cell count of 12.4, hemoglobin 16.0 Chemistry panel reveals a hypokalemia of 2.9, hypochloremia 98, hyperglycemia 140 C. difficile negative CT abdomen pelvis shows no pulmonary embolus, multiple prominent bilateral hilar lymph nodes without definite lymphadenopathy, interval development of 1.4 cm right adrenal gland nodule, stable 2.2 cm right adrenal gland nodule, s/p left adrenalectomy, recommended CT adrenal protocol for further evaluation. Troponin negative. D-dimer 1.26 TSH 1.35 She remained orthostatic after fluid boluses.  To further evaluate syncope, she underwent ultrasound carotid bilateral which revealed less than 50% stenosis in internal carotid arteries bilaterally.  Patent vertebral arteries.  Echocardiogram with no significant findings.  EF 60-65%.  No aortic stenosis.  Trivial mitral valve regurgitation.   Regarding diarrhea, GI pathogen panel is now in process and she is on Imodium 4 mg twice daily which was started on 4/18.   Today:  Since March 4th, she has had  persistent diarrhea. 3-6 BMs daily. Watery. Had a little brbpr on toilet tissue yesterday x1. This is new. No black stools. No BM today. Had a pancake and chocolate pudding for breakfast. No BM today as of 12:30 pm. Had 5 BMs yesterday. At home, she was taking 6-8 BMs per day. Intermittent lower abdominal cramping prior to BMs that improves thereafter.   No recent antibiotics. No known sick contacts. No well water. No travel.  No recent medication changes.  Prior to surgery, she had intermittent diarrhea that would last a couple of days at a time and responded very well to Imodium.  Typically with 2-3 BMs per day in the setting of diarrhea.  Previously prescribed Lomotil which worked well for her.  Stays away from fried foods. Little dairy.   Had vomiting at home. No hematemesis. StartedoOver the last week, the thought of eating was making her nauseated. So she wasn't eating much. Appetite has improved. 2 days prior to presenting to the ED. Chronic history of nausea, but worse since her surgery. No vomiting since presenting to the hospital. No dizziness or syncopal episodes. Patient states they stopped the triamterene/hydrochlorothiazide since admission which was prescribed for Mnire's disease.  She is worried about not having this medication.  Discussed the fact that this is being held due to hypotension.    Reports 30 pound weight loss since her surgery.  91 kg in the ED.  100 kg at her office visit in September 2021 with Dr. Jenetta Downer. 20 lb weight loss.   Constant pain from hips to lower legs bilaterally since surgery, intermittent burning and numbness in upper thighs since surgery.    Last EGD 2017: Normal examined esophagus s/p empiric dilation, 2 cm hiatal hernia, no other abnormalities. Last colonoscopy  September 2021: 15 mm polyp in the cecum removed with combination of hot snare and cold snare in piecemeal fashion, 210-12 mm polyps in the rectum and descending colon resected and  retrieved, no other significant abnormalities.  Biopsies were taken to evaluate for microscopic colitis.  Pathology revealed cecal polyp was sessile serrated polyp, other polyps were tubular adenomas, random colon biopsies with polypoid colonic mucosa with a prominent lymphoid aggregate, negative for dysplasia. Recommended repeat colonoscopy in 6 months.  Past Medical History:  Diagnosis Date  . Arthritis    Phreesia 10/14/2020  . Blood transfusion without reported diagnosis    Phreesia 10/14/2020  . Breast cancer ([place]) 2007   History of  XRT, onTamoxifen  . Bruises easily   . Cancer ([place])    Phreesia 10/14/2020  . Depression   . Depression    Phreesia 10/14/2020  . Diabetes mellitus    prediabetic  . Diabetes mellitus without complication ([place])    Phreesia 10/14/2020  . GERD (gastroesophageal reflux disease)   . Hypertriglyceridemia   . Hypothyroidism    following chemo amnd radiaition for breast cancer, needed replacement short term  . Hypothyroidism (acquired)    replaced x 1 year  . IBS (irritable bowel syndrome)   . Menieres disease    Controlled with triamterene   . Migraines   . Obesity   . Palpitations   . 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[place])   . Sleep apnea 2010   Problems with CPAP    Past Surgical History:  Procedure Laterality Date  . ABDOMINAL EXPOSURE N/A 12/03/2020   Procedure: ABDOMINAL EXPOSURE;  Surgeon: Rosetta Posner, MD;  Location: [facility] OR;  Service: Vascular;  Laterality: N/A;  . ABDOMINAL HYSTERECTOMY  1998   Benign, fibroids  . 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 ANTERIOR LUMBAR FUSION N/A 12/03/2020   Procedure: Lumbar Four-Five Lumbar Five Sacral One Anterior lumbar interbody fusion;  Surgeon: Ashok Pall, MD;  Location: [place];  Service: Neurosurgery;  Laterality: N/A;  . APPENDECTOMY  06/12/08  . BIOPSY  07/02/2020   Procedure: BIOPSY;  Surgeon: Harvel Quale, MD;  [facility];  Service:  Gastroenterology;;  . BREAST LUMPECTOMY Left 2008  . BREAST RECONSTRUCTION     Left reconstructive  . BREAST SURGERY  2007   Left lumpectomy  . BREAST SURGERY     Mammosite - right side  . CHOLECYSTECTOMY  1985  . COLONOSCOPY WITH PROPOFOL N/A 07/02/2020   Procedure: COLONOSCOPY WITH PROPOFOL;  Surgeon: Harvel Quale, MD;  [facility];  Service: Gastroenterology;  Laterality: N/A;  1045  . ESOPHAGEAL DILATION  12/19/2015   Procedure: ESOPHAGEAL DILATION;  Surgeon: Rogene Houston, MD;  [facility];  Service: Endoscopy;;  . ESOPHAGOGASTRODUODENOSCOPY N/A 12/19/2015   Procedure: ESOPHAGOGASTRODUODENOSCOPY (EGD);  Surgeon: Rogene Houston, MD;  [facility];  Service: Endoscopy;  Laterality: N/A;  11:40  . POLYPECTOMY  07/02/2020   Procedure: POLYPECTOMY;  Surgeon: Montez Morita, Quillian Quince, MD;  [facility];  Service: Gastroenterology;;  . SPLENECTOMY, TOTAL  0/86/7619   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  . THYROIDECTOMY, PARTIAL  11/05/2010   Benign disease    Prior to Admission medications   Medication Sig Start Date End Date Taking? Authorizing Provider  CLOTRIMAZOLE EX Apply 1 application topically daily as needed (itching an breast). 15 %   Yes [provider]  cyclobenzaprine (FLEXERIL) 10 MG tablet Take 1  tablet (10 mg total) by mouth 3 (three) times daily as needed for muscle spasms. 12/06/20  Yes Pool, Mallie Mussel, MD  Dulaglutide (TRULICITY) 1.5 VQ/2.5ZD SOPN Inject 0.5 mLs (1.5 mg total) into the skin once a week. 04/17/20  Yes Philemon Kingdom, MD  ergocalciferol (VITAMIN D2) 1.25 MG (50000 UT) capsule Take 50,000 Units by mouth once a week.   Yes [provider]  fluconazole (DIFLUCAN) 150 MG tablet TAKE ONE TABLET ONCE DAILY , AS NEEDED, FOR VAGINAL ITCH ASSOCIATED WITH ANTIBIOTIC USE Patient taking differently: Take 150 mg by mouth See admin instructions. TAKE ONE TABLET ONCE DAILY , AS  NEEDED, FOR VAGINAL ITCH ASSOCIATED WITH ANTIBIOTIC USE 10/07/20  Yes Fayrene Helper, MD  insulin degludec (TRESIBA FLEXTOUCH) 200 UNIT/ML FlexTouch Pen Inject 50 Units into the skin at bedtime. Patient taking differently: Inject 46-48 Units into the skin at bedtime. 01/12/21  Yes Philemon Kingdom, MD  LORazepam (ATIVAN) 1 MG tablet Take 1 mg by mouth daily as needed (Nausea).   Yes [provider]  Melatonin 10 MG TABS Take 1 tablet by mouth daily.   Yes [provider]  Metoprolol Tartrate 37.5 MG TABS TAKE 1 TABLET BY MOUTH TWICE DAILY 01/12/21  Yes Fayrene Helper, MD  naproxen (NAPROSYN) 500 MG tablet TAKE 1 TABLET(500 MG) BY MOUTH TWICE DAILY WITH A MEAL Patient taking differently: Take 500 mg by mouth 2 (two) times daily with a meal. 01/08/21  Yes Fayrene Helper, MD  omeprazole (PRILOSEC) 40 MG capsule Take 1 capsule (40 mg total) by mouth daily. 10/11/20  Yes Fayrene Helper, MD  ondansetron (ZOFRAN ODT) 4 MG disintegrating tablet Take 1 tablet (4 mg total) by mouth every 8 (eight) hours as needed for nausea. 12/19/20  Yes Noemi Chapel, MD  oxyCODONE-acetaminophen (PERCOCET/ROXICET) 5-325 MG tablet Take 1 tablet by mouth every 8 (eight) hours as needed for severe pain. 01/16/21  Yes Couture, Cortni S, PA-C  predniSONE (DELTASONE) 10 MG tablet Take 1 tablet (10 mg total) by mouth daily for 5 days. 01/16/21 01/21/21 Yes Couture, Cortni S, PA-C  rosuvastatin (CRESTOR) 20 MG tablet Take 1 tablet (20 mg total) by mouth daily. 01/12/21  Yes Fayrene Helper, MD  temazepam (RESTORIL) 15 MG capsule Take 1 capsule (15 mg total) by mouth at bedtime. 11/03/20  Yes Fayrene Helper, MD  traZODone (DESYREL) 100 MG tablet Take 1 tablet (100 mg total) by mouth at bedtime. 01/12/21  Yes Fayrene Helper, MD  triamterene-hydrochlorothiazide (MAXZIDE-25) 37.5-25 MG tablet Take 1 tablet by mouth daily.   Yes [provider]  venlafaxine XR (EFFEXOR-XR) 75 MG 24 hr  capsule TAKE 1 CAPSULE(75 MG) BY MOUTH DAILY Patient taking differently: Take 75 mg by mouth daily with breakfast. 01/05/21  Yes Fayrene Helper, MD  aspirin 81 MG EC tablet Take 81 mg by mouth daily. Patient not taking: Reported on 01/16/2021    [provider]  gabapentin (NEURONTIN) 100 MG capsule Take 2 capsules (200 mg total) by mouth 3 (three) times daily. In three days, increase to 300 mg TID Patient not taking: No sig reported 6/38/75   Delora Fuel, MD  glucose blood test strip See admin instructions. 01/31/20   [provider]  Insulin Pen Needle (PEN NEEDLES) 31G X 6 MM MISC See admin instructions. 01/31/20   [provider]  [facility] VERIO test strip USE AS DIRECTED FOUR TIMES DAILY 08/22/20   Fayrene Helper, MD  oxyCODONE (OXYCONTIN) 10 mg 12 hr  tablet Take 1 tablet (10 mg total) by mouth every 12 (twelve) hours. Patient not taking: No sig reported 12/06/20   Earnie Larsson, MD    Current Facility-Administered Medications  Medication Dose Route Frequency Provider Last Rate Last Admin  . 0.9 %  sodium chloride infusion   Intravenous Continuous Zierle-Ghosh, Asia B, DO 75 mL/hr at 01/19/21 2202 New Bag at 01/19/21 2202  . acetaminophen (TYLENOL) tablet 650 mg  650 mg Oral Q6H PRN Zierle-Ghosh, Asia B, DO   650 mg at 01/17/21 1635   Or  . acetaminophen (TYLENOL) suppository 650 mg  650 mg Rectal Q6H PRN Zierle-Ghosh, Asia B, DO      . aspirin EC tablet 81 mg  81 mg Oral Daily Zierle-Ghosh, Asia B, DO   81 mg at 01/20/21 0941  . cyclobenzaprine (FLEXERIL) tablet 5 mg  5 mg Oral BID PRN Cristal Deer, MD   5 mg at 01/19/21 2205  . feeding supplement (GLUCERNA SHAKE) (GLUCERNA SHAKE) liquid 237 mL  237 mL Oral TID BM Shah, Pratik D, DO   237 mL at 01/20/21 0942  . gabapentin (NEURONTIN) capsule 300 mg  300 mg Oral TID Zierle-Ghosh, Asia B, DO   300 mg at 01/20/21 0940  . heparin injection 5,000 Units  5,000 Units Subcutaneous Q8H Zierle-Ghosh, Asia B, DO    5,000 Units at 01/20/21 0630  . insulin aspart (novoLOG) injection 0-15 Units  0-15 Units Subcutaneous TID WC Zierle-Ghosh, Asia B, DO   3 Units at 01/19/21 1159  . insulin aspart (novoLOG) injection 0-5 Units  0-5 Units Subcutaneous QHS Zierle-Ghosh, Asia B, DO      . insulin detemir (LEVEMIR) injection 20 Units  20 Units Subcutaneous QHS Cristal Deer, MD      . loperamide (IMODIUM) capsule 4 mg  4 mg Oral BID Manuella Ghazi, Pratik D, DO   4 mg at 01/20/21 0940  . LORazepam (ATIVAN) tablet 1 mg  1 mg Oral Daily PRN Zierle-Ghosh, Asia B, DO      . meclizine (ANTIVERT) tablet 12.5 mg  12.5 mg Oral TID Zierle-Ghosh, Asia B, DO   12.5 mg at 01/20/21 0941  . melatonin tablet 9 mg  9 mg Oral QHS Cristal Deer, MD   9 mg at 01/19/21 2154  . metoprolol tartrate (LOPRESSOR) tablet 37.5 mg  37.5 mg Oral BID Zierle-Ghosh, Asia B, DO   37.5 mg at 01/20/21 0941  . morphine 2 MG/ML injection 2 mg  2 mg Intravenous Q2H PRN Zierle-Ghosh, Asia B, DO   2 mg at 01/17/21 0604  . multivitamin with minerals tablet 1 tablet  1 tablet Oral Daily Manuella Ghazi, Pratik D, DO   1 tablet at 01/20/21 0941  . ondansetron (ZOFRAN) tablet 4 mg  4 mg Oral Q6H PRN Zierle-Ghosh, Asia B, DO       Or  . ondansetron (ZOFRAN) injection 4 mg  4 mg Intravenous Q6H PRN Zierle-Ghosh, Asia B, DO      . oxyCODONE (Oxy IR/ROXICODONE) immediate release tablet 5 mg  5 mg Oral Q4H PRN Zierle-Ghosh, Asia B, DO   5 mg at 01/18/21 0848  . oxyCODONE (OXYCONTIN) 12 hr tablet 10 mg  10 mg Oral Q12H Zierle-Ghosh, Asia B, DO   10 mg at 01/20/21 0940  . pantoprazole (PROTONIX) EC tablet 40 mg  40 mg Oral Daily Zierle-Ghosh, Asia B, DO   40 mg at 01/20/21 0941  . potassium chloride SA (KLOR-CON) CR tablet 40 mEq  40 mEq Oral BID Manuella Ghazi, Pratik  D, DO   40 mEq at 01/20/21 0941  . rosuvastatin (CRESTOR) tablet 20 mg  20 mg Oral Daily Zierle-Ghosh, Asia B, DO   20 mg at 01/20/21 0940  . temazepam (RESTORIL) capsule 15 mg  15 mg Oral QHS Zierle-Ghosh, Asia B, DO   15 mg  at 01/19/21 2154  . traZODone (DESYREL) tablet 100 mg  100 mg Oral QHS Zierle-Ghosh, Asia B, DO   100 mg at 01/19/21 2154  . venlafaxine XR (EFFEXOR-XR) 24 hr capsule 75 mg  75 mg Oral Q breakfast Zierle-Ghosh, Asia B, DO   75 mg at 01/20/21 2355    Allergies as of 01/16/2021 - Review Complete 01/16/2021  Allergen Reaction Noted  . Penicillins Swelling   . Metformin and related Diarrhea and Nausea Only 03/30/2015  . Wound dressing adhesive Itching and Rash 09/09/2020  . Empagliflozin Other (See Comments) 02/29/2020  . Penicillin g Other (See Comments) 02/29/2020  . Statins Other (See Comments) 07/15/2014  . Zetia [ezetimibe] Other (See Comments) 11/21/2014    Family History  Problem Relation Age of Onset  . Diabetes Mother   . Arrhythmia Mother   . Heart disease Father   . Hyperlipidemia Father   . Arrhythmia Father   . Cancer Paternal Grandmother        Breast  . Diabetes Sister   . Diabetes Brother   . Heart disease Brother     Social History   Socioeconomic History  . Marital status: Married    Spouse name: Alroy Dust  . Number of children: 2  . Years of education: Not on file  . Highest education level: Not on file  Occupational History  . Not on file  Tobacco Use  . Smoking status: Current Every Day Smoker    Packs/day: 0.50    Years: 40.00    Pack years: 20.00    Types: Cigarettes  . Smokeless tobacco: Never Used  . Tobacco comment: 1/2 pack a day  Vaping Use  . Vaping Use: Never used  Substance and Sexual Activity  . Alcohol use: No    Alcohol/week: 0.0 standard drinks    Comment: Encouraged to quit smoking. She has tried the nicotone gum and patches but did not help  . Drug use: No  . Sexual activity: Yes    Birth control/protection: Surgical  Other Topics Concern  . Not on file  Social History Narrative  . Not on file   Social Determinants of Health   Financial Resource Strain: Medium Risk  . Difficulty of Paying Living Expenses: Somewhat hard   Food Insecurity: No Food Insecurity  . Worried About Charity fundraiser in the Last Year: Never true  . Ran Out of Food in the Last Year: Never true  Transportation Needs: No Transportation Needs  . Lack of Transportation (Medical): No  . Lack of Transportation (Non-Medical): No  Physical Activity: Inactive  . Days of Exercise per Week: 0 days  . Minutes of Exercise per Session: 0 min  Stress: Stress Concern Present  . Feeling of Stress : To some extent  Social Connections: Moderately Integrated  . Frequency of Communication with Friends and Family: Three times a week  . Frequency of Social Gatherings with Friends and Family: Three times a week  . Attends Religious Services: More than 4 times per year  . Active Member of Clubs or Organizations: No  . Attends Archivist Meetings: Never  . Marital Status: Married  Human resources officer Violence: Not At Risk  .  Fear of Current or Ex-Partner: No  . Emotionally Abused: No  . Physically Abused: No  . Sexually Abused: No    Review of Systems: Gen: Denies fever, chills, cold or flulike symptoms. CV: Denies chest pain or palpitations. Resp: Denies shortness of breath or cough. GI: See HPI Derm: Denies rash Heme: See HPI  Physical Exam: Vital signs in last 24 hours: Temp:  [98.2 F (36.8 C)-99.1 F (37.3 C)] 99.1 F (37.3 C) (04/19 0542) Pulse Rate:  [80-96] 80 (04/19 0542) Resp:  [16-20] 20 (04/19 0542) BP: (86-116)/(41-60) 86/41 (04/19 0542) SpO2:  [94 %-98 %] 95 % (04/19 0542) FiO2 (%):  [0 %] 0 % (04/18 1429) Last BM Date: 01/19/21 General:   Alert,  well-developed, well-nourished, pleasant and cooperative in NAD Head:  Normocephalic and atraumatic. Eyes:  Sclera clear, no icterus.   Conjunctiva pink. Ears:  Normal auditory acuity. Lungs:  Clear throughout to auscultation.   No wheezes, crackles, or rhonchi. No acute distress. Heart:  Regular rate and rhythm; no murmurs, clicks, rubs,  or gallops. Abdomen:  Soft  and nondistended. Minimal TTP in RUQ. Mild TTP in LUQ and LLQ. Healing vertical incision in LLQ. No masses, hepatosplenomegaly or hernias noted. Normal bowel sounds, without guarding, and without rebound.   Rectal:  Deferred.   Msk:  Symmetrical without gross deformities. Normal posture. Extremities:  With trace LE edema.  Neurologic:  Alert and  oriented x4;  grossly normal neurologically. Skin:  Intact without significant lesions or rashes. Psych: Normal mood and affect.  Intake/Output from previous day: 04/18 0701 - 04/19 0700 In: 600 [P.O.:600] Out: -  Intake/Output this shift: No intake/output data recorded.  Lab Results: Recent Labs    01/20/21 0552  WBC 12.0*  HGB 12.8  HCT 39.4  PLT 314   BMET Recent Labs    01/18/21 0525 01/20/21 0552  NA 145 144  K 3.6 3.0*  CL 114* 112*  CO2 24 23  GLUCOSE 71 108*  BUN 7* 9  CREATININE 0.81 0.76  CALCIUM 9.2 9.0    Impression: 60 year old female with past medical history of depression, diabetes, GERD, hyperlipidemia, hypothyroidism, pituitary insufficiency, IBS-mixed type, OSA, history of breast cancer in remission, recent lumbar surgery who presented to the emergency room 4/15 with persistent leg pain and diarrhea since lumbar surgery on 3/2.  She is scheduled for MRI of the lumbar spine tomorrow for further evaluation of her leg pain.  GI was consulted due to persistent diarrhea with hypotension and reports of syncope outpatient.   Diarrhea: Chronic history of IBS with intermittent diarrhea that typically responds well to Imodium.  Since her lumbar surgery, she reports persistent watery diarrhea with 3-6 BMs daily that has not been responsive to Imodium, weight loss (documented 20 lbs over the last 6 months), decreased appetite with increased nausea and a couple days of vomiting just prior to presenting to the hospital. Chronic associated intermittent lower abdominal cramping prior to bowel movements that resolves thereafter  without change. Also reports single episode of toilet tissue hematochezia yesterday. Denies melena.  Denies recent antibiotics, sick contacts, well water, or recent medication changes.  Colonoscopy in September 2021 with random colon biopsies negative for microscopic colitis.  CT A/P this admission with no evidence of colitis, pancreatic abnormality, or other acute intra-abdominal abnormality.  She did have prominent bilateral hilar lymph nodes with no definite lymphadenopathy and interval development of 1.4 cm right adrenal gland nodule with stable 2.2 cm right adrenal nodule.  C. difficile  testing negative.  GI pathogen panel is pending. Also with mild leukocytosis, hemoglobin wnl, and she is afebrile.  She was started on scheduled Imodium on 4/18 and notes improvement in diarrhea today.  No bowel movement as of 12:30 PM today.  She has not had any nausea or vomiting since hospital admission and she reports her appetite returned to normal after admission.  Notably, she also tells me she has not had any dizziness or presyncope since presenting to the hospital.  Differential for diarrhea is quite broad. Will need to rule out infectious diarrhea to start with. It is encouraging she has had improvement with imodium. May have also had an IBS flare, possible bile salt diarrhea, celiac disease. If initial testing is negative and diarrhea persist, will need to consider further testing as hypotension and syncope prior to admission may be related to volume depletion.   Toilet tissue hematochezia: Single episode of toilet tissue hematochezia in the setting of diarrhea.  Suspect benign anorectal source.  Hemoglobin remained stable.  Continue to monitor.  History of colonic adenomas: Last colonoscopy in September 2021 with large 15 mm sessile serrated polyp and two 10-12 mm tubular adenomas.  Recommended repeat colonoscopy in 6 months.  She is overdue for surveillance colonoscopy.  This will likely be arranged  outpatient.  Plan: 1.  Follow-up on GI pathogen panel.   2.  TTG IgA and IgA total. 3.  Continue Imodium scheduled. 4.  Could consider cholestyramine if needed. 5.  Monitor for return of rectal bleeding. 6.  She is due for surveillance colonoscopy.  Suspect this will be completed outpatient.    LOS: 2 days    01/20/2021, 11:34 AM   Aliene Altes, PA-C [facility]

## 2021-01-20 NOTE — Progress Notes (Signed)
PROGRESS NOTE    Annette [name]  [id number]:[id number] DOB: [birth year] DOA: 01/16/2021 PCP: Fayrene Helper, MD   Brief Narrative:  Patient admitted with chronic diarrhea since March 2022, she presented to the ED with dizziness and syncope. Patient seen and examined at bedside she stated she still having diarrhea she feels quite tired.  She continues have ongoing diarrhea which is a chronic issue for her.  C. difficile testing negative.  Finally she was noted to have associated syncope with orthostatic hypotension secondarily to this. She continues to have ongoing diarrhea without improvement and GI will consult.  Assessment & Plan:   Principal Problem:   Syncope Active Problems:   Type 2 diabetes mellitus with other specified complication (HCC)   Hypokalemia   Intractable pain   Leukocytosis   Syncope with associated orthostasis -Secondary to volume depletion related to persistent diarrhea -Continue IV fluid -Plan to repeat orthostatics once blood pressure stabilizes  Acute on chronic diarrhea  -May be related IBS -Check GI panel-pending -Cdiff negative -Schedule Imodium which is not helping -Appreciate GI evaluation; may need repeat colonoscopy since surveillance was indicated 6 mos ago -Recheck am labs -DC metamucil  Hypomagnesemia/hypokalemia -Replete and reevaluate in a.m.  Type 2 DM -Controlled -Continue SSI and Lantus  Leukocytosis -Likely reactive secondary to above -Procalcitonin low  DVT prophylaxis: Heparin Code Status: Full Family Communication:None at bedside Disposition Plan:  Status is: Inpatient  Remains inpatient appropriate because:IV treatments appropriate due to intensity of illness or inability to take PO and Inpatient level of care appropriate due to severity of illness   Dispo: The patient is from: Home  Anticipated d/c is to: Home  Patient currently is not medically stable to d/c.              Difficult  to place patient No  Nutritional Assessment:  The patient's BMI is: Body mass index is 33.45 kg/m.Marland Kitchen  Seen by dietician.  I agree with the assessment and plan as outlined below:  Nutrition Status: Nutrition Problem: Predicted suboptimal nutrient intake Etiology: decreased appetite Signs/Symptoms: other (comment) (per MST report) Interventions: MVI,Glucerna shake  .Consultants:   GI  Procedures:   See below  Antimicrobials:   None   Subjective: Patient seen and evaluated today with ongoing complaints of diarrhea that has not been improved.  She denies any abdominal pain, cramping, nausea, or vomiting.  Objective: Vitals:   01/19/21 2205 01/19/21 2217 01/19/21 2219 01/20/21 0542  BP: 108/60 (!) 92/50 (!) 116/56 (!) 86/41  Pulse: 81 96 90 80  Resp: 18 20 18 20   Temp: 98.2 F (36.8 C) 98.2 F (36.8 C) 98.2 F (36.8 C) 99.1 F (37.3 C)  TempSrc: Oral   Oral  SpO2: 98% 94% 95% 95%  Weight:      Height:        Intake/Output Summary (Last 24 hours) at 01/20/2021 1036 Last data filed at 01/19/2021 1700 Gross per 24 hour  Intake 480 ml  Output --  Net 480 ml   Filed Weights   01/16/21 1724  Weight: 91.2 kg    Examination:  General exam: Appears calm and comfortable  Respiratory system: Clear to auscultation. Respiratory effort normal. Cardiovascular system: S1 & S2 heard, RRR.  Gastrointestinal system: Abdomen is soft Central nervous system: Alert and awake Extremities: No edema Skin: No significant lesions noted Psychiatry: Flat affect.    Data Reviewed: I have personally reviewed following labs and imaging studies  CBC: Recent Labs  Lab 01/16/21 1816 01/17/21  0510 01/20/21 0552  WBC 12.4* 12.7* 12.0*  NEUTROABS 7.0 5.8  --   HGB 16.0* 14.9 12.8  HCT 46.6* 43.8 39.4  MCV 96.9 98.9 101.5*  PLT 421* 350 628   Basic Metabolic Panel: Recent Labs  Lab 01/16/21 1816 01/17/21 0510 01/18/21 0525 01/20/21 0552  NA 134* 138 145 144  K  2.9* 2.8* 3.6 3.0*  CL 98 105 114* 112*  CO2 22 21* 24 23  GLUCOSE 140* 90 71 108*  BUN 16 11 7* 9  CREATININE 1.05* 0.87 0.81 0.76  CALCIUM 10.0 8.8* 9.2 9.0  MG 1.9 1.6* 1.7 1.6*   GFR: Estimated Creatinine Clearance: 82.4 mL/min (by C-G formula based on SCr of 0.76 mg/dL). Liver Function Tests: Recent Labs  Lab 01/16/21 1816 01/17/21 0510  AST 34 27  ALT 29 25  ALKPHOS 89 78  BILITOT 0.7 0.5  PROT 8.0 7.1  ALBUMIN 4.1 3.6   Recent Labs  Lab 01/16/21 1816  LIPASE 86*   No results for input(s): AMMONIA in the last 168 hours. Coagulation Profile: No results for input(s): INR, PROTIME in the last 168 hours. Cardiac Enzymes: No results for input(s): CKTOTAL, CKMB, CKMBINDEX, TROPONINI in the last 168 hours. BNP (last 3 results) No results for input(s): PROBNP in the last 8760 hours. HbA1C: No results for input(s): HGBA1C in the last 72 hours. CBG: Recent Labs  Lab 01/19/21 0750 01/19/21 1109 01/19/21 1612 01/19/21 2223 01/20/21 0748  GLUCAP 86 186* 103* 151* 93   Lipid Profile: No results for input(s): CHOL, HDL, LDLCALC, TRIG, CHOLHDL, LDLDIRECT in the last 72 hours. Thyroid Function Tests: No results for input(s): TSH, T4TOTAL, FREET4, T3FREE, THYROIDAB in the last 72 hours. Anemia Panel: No results for input(s): VITAMINB12, FOLATE, FERRITIN, TIBC, IRON, RETICCTPCT in the last 72 hours. Sepsis Labs: Recent Labs  Lab 01/19/21 1255  PROCALCITON <0.10    Recent Results (from the past 240 hour(s))  C Difficile Quick Screen w PCR reflex     Status: None   Collection Time: 01/16/21 10:40 PM   Specimen: STOOL  Result Value Ref Range Status   C Diff antigen NEGATIVE NEGATIVE Final   C Diff toxin NEGATIVE NEGATIVE Final   C Diff interpretation No C. difficile detected.  Final    Comment: Performed at [facility], [address] [place],  [address]  Resp Panel by RT-PCR (Flu A&B, Covid) Nasopharyngeal Swab     Status: None   Collection Time:  01/17/21 12:50 AM   Specimen: Nasopharyngeal Swab; Nasopharyngeal(NP) swabs in vial transport medium  Result Value Ref Range Status   SARS Coronavirus 2 by RT PCR NEGATIVE NEGATIVE Final    Comment: (NOTE) SARS-CoV-2 target nucleic acids are NOT DETECTED.  The SARS-CoV-2 RNA is generally detectable in upper respiratory specimens during the acute phase of infection. The lowest concentration of SARS-CoV-2 viral copies this assay can detect is 138 copies/mL. A negative result does not preclude SARS-Cov-2 infection and should not be used as the sole basis for treatment or other patient management decisions. A negative result may occur with  improper specimen collection/handling, submission of specimen other than nasopharyngeal swab, presence of viral mutation(s) within the areas targeted by this assay, and inadequate number of viral copies(<138 copies/mL). A negative result must be combined with clinical observations, patient history, and epidemiological information. The expected result is Negative.  Fact Sheet for Patients:  EntrepreneurPulse.com.au  Fact Sheet for Healthcare Providers:  IncredibleEmployment.be  This test is no t yet approved  or cleared by the [place] and  has been authorized for detection and/or diagnosis of SARS-CoV-2 by FDA under an Emergency Use Authorization (EUA). This EUA will remain  in effect (meaning this test can be used) for the duration of the COVID-19 declaration under Section 564(b)(1) of the Act, 21 U.S.C.section 360bbb-3(b)(1), unless the authorization is terminated  or revoked sooner.       Influenza A by PCR NEGATIVE NEGATIVE Final   Influenza B by PCR NEGATIVE NEGATIVE Final    Comment: (NOTE) The Xpert Xpress SARS-CoV-2/FLU/RSV plus assay is intended as an aid in the diagnosis of influenza from Nasopharyngeal swab specimens and should not be used as a sole basis for treatment. Nasal washings  and aspirates are unacceptable for Xpert Xpress SARS-CoV-2/FLU/RSV testing.  Fact Sheet for Patients: EntrepreneurPulse.com.au  Fact Sheet for Healthcare Providers: IncredibleEmployment.be  This test is not yet approved or cleared by the [place] FDA and has been authorized for detection and/or diagnosis of SARS-CoV-2 by FDA under an Emergency Use Authorization (EUA). This EUA will remain in effect (meaning this test can be used) for the duration of the COVID-19 declaration under Section 564(b)(1) of the Act, 21 U.S.C. section 360bbb-3(b)(1), unless the authorization is terminated or revoked.  Performed at [facility], [address] [place],  [address]          Radiology Studies: No results found.      Scheduled Meds: . aspirin EC  81 mg Oral Daily  . feeding supplement (GLUCERNA SHAKE)  237 mL Oral TID BM  . gabapentin  300 mg Oral TID  . heparin  5,000 Units Subcutaneous Q8H  . insulin aspart  0-15 Units Subcutaneous TID WC  . insulin aspart  0-5 Units Subcutaneous QHS  . insulin detemir  20 Units Subcutaneous QHS  . loperamide  4 mg Oral BID  . meclizine  12.5 mg Oral TID  . melatonin  9 mg Oral QHS  . metoprolol tartrate  37.5 mg Oral BID  . multivitamin with minerals  1 tablet Oral Daily  . oxyCODONE  10 mg Oral Q12H  . pantoprazole  40 mg Oral Daily  . potassium chloride  40 mEq Oral BID  . rosuvastatin  20 mg Oral Daily  . temazepam  15 mg Oral QHS  . traZODone  100 mg Oral QHS  . venlafaxine XR  75 mg Oral Q breakfast   Continuous Infusions: . sodium chloride 75 mL/hr at 01/19/21 2202     LOS: 2 days    Time spent: 35 minutes    Caylea Foronda Darleen Crocker, DO [facility]  If 7PM-7AM, please contact night-coverage [email or web address] 01/20/2021, 10:36 AM

## 2021-01-21 ENCOUNTER — Encounter (HOSPITAL_COMMUNITY): Payer: Self-pay

## 2021-01-21 ENCOUNTER — Ambulatory Visit (HOSPITAL_COMMUNITY): Admission: RE | Admit: 2021-01-21 | Payer: BC Managed Care – PPO | Source: Ambulatory Visit

## 2021-01-21 ENCOUNTER — Inpatient Hospital Stay (HOSPITAL_COMMUNITY): Payer: BC Managed Care – PPO

## 2021-01-21 DIAGNOSIS — Z86010 Personal history of colonic polyps: Secondary | ICD-10-CM

## 2021-01-21 DIAGNOSIS — R197 Diarrhea, unspecified: Secondary | ICD-10-CM

## 2021-01-21 DIAGNOSIS — R55 Syncope and collapse: Secondary | ICD-10-CM

## 2021-01-21 LAB — TISSUE TRANSGLUTAMINASE, IGA: Tissue Transglutaminase Ab, IgA: <2 U/mL (ref 0–3)

## 2021-01-21 LAB — BASIC METABOLIC PANEL
Anion gap: 8 (ref 5–15)
BUN: 8 mg/dL (ref 8–23)
CO2: 24 mmol/L (ref 22–32)
Calcium: 8.8 mg/dL — ABNORMAL LOW (ref 8.9–10.3)
Chloride: 112 mmol/L — ABNORMAL HIGH (ref 98–111)
Creatinine, Ser: 0.76 mg/dL (ref 0.44–1.00)
GFR, Estimated: >60 mL/min (ref >60)
Glucose, Bld: 117 mg/dL — ABNORMAL HIGH (ref 70–99)
Potassium: 3.4 mmol/L — ABNORMAL LOW (ref 3.5–5.1)
Sodium: 144 mmol/L (ref 135–145)

## 2021-01-21 LAB — GLUCOSE, CAPILLARY
Glucose-Capillary: 106 mg/dL — ABNORMAL HIGH (ref 70–99)
Glucose-Capillary: 147 mg/dL — ABNORMAL HIGH (ref 70–99)
Glucose-Capillary: 116 mg/dL — ABNORMAL HIGH (ref 70–99)
Glucose-Capillary: 145 mg/dL — ABNORMAL HIGH (ref 70–99)

## 2021-01-21 LAB — IGA: IgA: 298 mg/dL (ref 87–352)

## 2021-01-21 LAB — MAGNESIUM: Magnesium: 1.7 mg/dL (ref 1.7–2.4)

## 2021-01-21 LAB — CORTISOL: Cortisol, Plasma: 5.7 ug/dL

## 2021-01-21 MED ORDER — FLEET ENEMA 7-19 GM/118ML RE ENEM
2.0000 | ENEMA | Freq: Once | RECTAL | Status: AC
  Administered 2021-01-22: 2 via RECTAL
  Filled 2021-01-21: qty 2

## 2021-01-21 MED ORDER — GADOBUTROL 1 MMOL/ML IV SOLN
10.0000 mL | Freq: Once | INTRAVENOUS | Status: AC | PRN
  Administered 2021-01-21: 10 mL via INTRAVENOUS

## 2021-01-21 NOTE — Progress Notes (Signed)
PROGRESS NOTE    Annette [name]  UYQ:[id number] DOB: [birth year] DOA: 01/16/2021 PCP: Fayrene Helper, MD   Brief Narrative:   Patient admitted with chronic diarrhea since March 2022, she presented to the ED with dizziness and syncope.Patient seen and examined at bedside she stated she still having diarrhea she feels quite tired.She continues have ongoing diarrhea which is a chronic issue for her. C. difficile testing negative. Finally she was noted to have associated syncope with orthostatic hypotension secondarily to this. She continues to have ongoing diarrhea without improvement and further GI workup pending.  Assessment & Plan:   Principal Problem:   Syncope Active Problems:   Type 2 diabetes mellitus with other specified complication (HCC)   Hypokalemia   Intractable pain   Leukocytosis   Diarrhea   History of colonic polyps   Syncope with associated orthostasis -Secondary to volume depletion related to persistent diarrhea -Continue IV fluid -Plan to repeat orthostatics once blood pressure stabilizes  Acute on chronic diarrhea -May be related IBS -GI panel negative -Cdiff negative -Schedule Imodium which is not helping -Appreciate GI evaluation; may need repeat colonoscopy since surveillance was indicated 6 mos ago -Recheck am labs -DC metamucil  Hypomagnesemia/hypokalemia -Replete and reevaluate in a.m.  Asymptomatic hypotension-related to above -Hold home bp meds -Can resume metoprolol at lower doses if no longer orthostatic in am  Type 2 DM -Controlled -Continue SSI and Lantus  Leukocytosis -Likely reactive secondary to above -Procalcitonin low -Monitor intermittently  DVT prophylaxis:Heparin Code Status:Full Family Communication:None at bedside Disposition Plan: Status is: Inpatient  Remains inpatient appropriate because:IV treatments appropriate due to intensity of illness or inability to take PO and Inpatient level of care  appropriate due to severity of illness   Dispo: The patient is from:Home Anticipated d/c is GL:OVFI Patient currently is not medically stable to d/c. Difficult to place patient No  Nutritional Assessment:  The patient's BMI is:Body mass index is 33.45 kg/m.Marland Kitchen  Seen by dietician. I agree with the assessment and plan as outlined below:  Nutrition Status: Nutrition Problem: Predicted suboptimal nutrient intake Etiology: decreased appetite Signs/Symptoms: other (comment) (per MST report) Interventions: MVI,Glucerna shake  .Consultants:  GI  Procedures:  See below  Antimicrobials:  None   Subjective: Patient seen and evaluated today with ongoing diarrhea with approximately 6 bowel movements noted overnight.  Objective: Vitals:   01/20/21 0542 01/20/21 1401 01/20/21 2120 01/21/21 0457  BP: (!) 86/41 (!) 82/35 (!) 94/33 (!) 86/45  Pulse: 80 75 83 78  Resp: 20  20 20   Temp: 99.1 F (37.3 C) 97.8 F (36.6 C) 98.5 F (36.9 C) 98.9 F (37.2 C)  TempSrc: Oral Oral Oral Oral  SpO2: 95% 99% 97% 97%  Weight:      Height:        Intake/Output Summary (Last 24 hours) at 01/21/2021 4332 Last data filed at 01/21/2021 0500 Gross per 24 hour  Intake 2063.03 ml  Output --  Net 2063.03 ml   Filed Weights   01/16/21 1724  Weight: 91.2 kg    Examination:  General exam: Appears calm and comfortable  Respiratory system: Clear to auscultation. Respiratory effort normal. Cardiovascular system: S1 & S2 heard, RRR.  Gastrointestinal system: Abdomen is soft Central nervous system: Alert and awake Extremities: No edema Skin: No significant lesions noted Psychiatry: Flat affect.    Data Reviewed: I have personally reviewed following labs and imaging studies  CBC: Recent Labs  Lab 01/16/21 1816 01/17/21 0510 01/20/21 0552  WBC 12.4*  12.7* 12.0*  NEUTROABS 7.0 5.8  --   HGB 16.0* 14.9 12.8  HCT 46.6* 43.8 39.4   MCV 96.9 98.9 101.5*  PLT 421* 350 166   Basic Metabolic Panel: Recent Labs  Lab 01/16/21 1816 01/17/21 0510 01/18/21 0525 01/20/21 0552 01/21/21 0554  NA 134* 138 145 144 144  K 2.9* 2.8* 3.6 3.0* 3.4*  CL 98 105 114* 112* 112*  CO2 22 21* 24 23 24   GLUCOSE 140* 90 71 108* 117*  BUN 16 11 7* 9 8  CREATININE 1.05* 0.87 0.81 0.76 0.76  CALCIUM 10.0 8.8* 9.2 9.0 8.8*  MG 1.9 1.6* 1.7 1.6* 1.7   GFR: Estimated Creatinine Clearance: 82.4 mL/min (by C-G formula based on SCr of 0.76 mg/dL). Liver Function Tests: Recent Labs  Lab 01/16/21 1816 01/17/21 0510  AST 34 27  ALT 29 25  ALKPHOS 89 78  BILITOT 0.7 0.5  PROT 8.0 7.1  ALBUMIN 4.1 3.6   Recent Labs  Lab 01/16/21 1816  LIPASE 86*   No results for input(s): AMMONIA in the last 168 hours. Coagulation Profile: No results for input(s): INR, PROTIME in the last 168 hours. Cardiac Enzymes: No results for input(s): CKTOTAL, CKMB, CKMBINDEX, TROPONINI in the last 168 hours. BNP (last 3 results) No results for input(s): PROBNP in the last 8760 hours. HbA1C: No results for input(s): HGBA1C in the last 72 hours. CBG: Recent Labs  Lab 01/20/21 0748 01/20/21 1136 01/20/21 1707 01/20/21 2137 01/21/21 0754  GLUCAP 93 152* 154* 151* 106*   Lipid Profile: No results for input(s): CHOL, HDL, LDLCALC, TRIG, CHOLHDL, LDLDIRECT in the last 72 hours. Thyroid Function Tests: No results for input(s): TSH, T4TOTAL, FREET4, T3FREE, THYROIDAB in the last 72 hours. Anemia Panel: No results for input(s): VITAMINB12, FOLATE, FERRITIN, TIBC, IRON, RETICCTPCT in the last 72 hours. Sepsis Labs: Recent Labs  Lab 01/19/21 1255  PROCALCITON <0.10    Recent Results (from the past 240 hour(s))  C Difficile Quick Screen w PCR reflex     Status: None   Collection Time: 01/16/21 10:40 PM   Specimen: STOOL  Result Value Ref Range Status   C Diff antigen NEGATIVE NEGATIVE Final   C Diff toxin NEGATIVE NEGATIVE Final   C Diff  interpretation No C. difficile detected.  Final    Comment: Performed at [facility], [address], [place],  [address]  Resp Panel by RT-PCR (Flu A&B, Covid) Nasopharyngeal Swab     Status: None   Collection Time: 01/17/21 12:50 AM   Specimen: Nasopharyngeal Swab; Nasopharyngeal(NP) swabs in vial transport medium  Result Value Ref Range Status   SARS Coronavirus 2 by RT PCR NEGATIVE NEGATIVE Final    Comment: (NOTE) SARS-CoV-2 target nucleic acids are NOT DETECTED.  The SARS-CoV-2 RNA is generally detectable in upper respiratory specimens during the acute phase of infection. The lowest concentration of SARS-CoV-2 viral copies this assay can detect is 138 copies/mL. A negative result does not preclude SARS-Cov-2 infection and should not be used as the sole basis for treatment or other patient management decisions. A negative result may occur with  improper specimen collection/handling, submission of specimen other than nasopharyngeal swab, presence of viral mutation(s) within the areas targeted by this assay, and inadequate number of viral copies(<138 copies/mL). A negative result must be combined with clinical observations, patient history, and epidemiological information. The expected result is Negative.  Fact Sheet for Patients:  EntrepreneurPulse.com.au  Fact Sheet for Healthcare Providers:  IncredibleEmployment.be  This test  is no t yet approved or cleared by the [place] and  has been authorized for detection and/or diagnosis of SARS-CoV-2 by FDA under an Emergency Use Authorization (EUA). This EUA will remain  in effect (meaning this test can be used) for the duration of the COVID-19 declaration under Section 564(b)(1) of the Act, 21 U.S.C.section 360bbb-3(b)(1), unless the authorization is terminated  or revoked sooner.       Influenza A by PCR NEGATIVE NEGATIVE Final   Influenza B by PCR NEGATIVE NEGATIVE Final     Comment: (NOTE) The Xpert Xpress SARS-CoV-2/FLU/RSV plus assay is intended as an aid in the diagnosis of influenza from Nasopharyngeal swab specimens and should not be used as a sole basis for treatment. Nasal washings and aspirates are unacceptable for Xpert Xpress SARS-CoV-2/FLU/RSV testing.  Fact Sheet for Patients: EntrepreneurPulse.com.au  Fact Sheet for Healthcare Providers: IncredibleEmployment.be  This test is not yet approved or cleared by the [place] FDA and has been authorized for detection and/or diagnosis of SARS-CoV-2 by FDA under an Emergency Use Authorization (EUA). This EUA will remain in effect (meaning this test can be used) for the duration of the COVID-19 declaration under Section 564(b)(1) of the Act, 21 U.S.C. section 360bbb-3(b)(1), unless the authorization is terminated or revoked.  Performed at [facility], [address], [place], [place] [address]   Gastrointestinal Panel by PCR , Stool     Status: None   Collection Time: 01/19/21 10:22 AM   Specimen: Stool  Result Value Ref Range Status   Campylobacter species NOT DETECTED NOT DETECTED Final   Plesimonas shigelloides NOT DETECTED NOT DETECTED Final   Salmonella species NOT DETECTED NOT DETECTED Final   Yersinia enterocolitica NOT DETECTED NOT DETECTED Final   Vibrio species NOT DETECTED NOT DETECTED Final   Vibrio cholerae NOT DETECTED NOT DETECTED Final   Enteroaggregative E coli (EAEC) NOT DETECTED NOT DETECTED Final   Enteropathogenic E coli (EPEC) NOT DETECTED NOT DETECTED Final   Enterotoxigenic E coli (ETEC) NOT DETECTED NOT DETECTED Final   Shiga like toxin producing E coli (STEC) NOT DETECTED NOT DETECTED Final   Shigella/Enteroinvasive E coli (EIEC) NOT DETECTED NOT DETECTED Final   Cryptosporidium NOT DETECTED NOT DETECTED Final   Cyclospora cayetanensis NOT DETECTED NOT DETECTED Final   Entamoeba histolytica NOT DETECTED NOT DETECTED Final    Giardia lamblia NOT DETECTED NOT DETECTED Final   Adenovirus F40/41 NOT DETECTED NOT DETECTED Final   Astrovirus NOT DETECTED NOT DETECTED Final   Norovirus GI/GII NOT DETECTED NOT DETECTED Final   Rotavirus A NOT DETECTED NOT DETECTED Final   Sapovirus (I, II, IV, and V) NOT DETECTED NOT DETECTED Final    Comment: Performed at [facility], [address], [place], [place] [address]         Radiology Studies: No results found.      Scheduled Meds: . aspirin EC  81 mg Oral Daily  . Dulaglutide  1.5 mg Subcutaneous Weekly  . feeding supplement (GLUCERNA SHAKE)  237 mL Oral TID BM  . gabapentin  300 mg Oral TID  . heparin  5,000 Units Subcutaneous Q8H  . insulin aspart  0-15 Units Subcutaneous TID WC  . insulin aspart  0-5 Units Subcutaneous QHS  . insulin detemir  20 Units Subcutaneous QHS  . loperamide  4 mg Oral BID  . meclizine  12.5 mg Oral TID  . melatonin  9 mg Oral QHS  . multivitamin with minerals  1 tablet Oral  Daily  . oxyCODONE  10 mg Oral Q12H  . pantoprazole  40 mg Oral Daily  . potassium chloride  40 mEq Oral BID  . rosuvastatin  20 mg Oral Daily  . temazepam  15 mg Oral QHS  . traZODone  100 mg Oral QHS  . venlafaxine XR  75 mg Oral Q breakfast   Continuous Infusions: . sodium chloride 75 mL/hr at 01/21/21 0500     LOS: 3 days    Time spent: 35 minutes    Hansford Hirt Darleen Crocker, DO [facility]  If 7PM-7AM, please contact night-coverage [email or web address] 01/21/2021, 9:22 AM

## 2021-01-21 NOTE — Progress Notes (Signed)
Subjective:  Patient says she had 6 stools yesterday.  He says some of her stools are large volume.  Stools are loose.  She denies melena or rectal bleeding.  She does have abdominal cramping before her bowels move.  She has urgency.  She says her appetite has improved but she still afraid to eat because it makes her diarrhea worse.  She says diarrhea started 2 days after her back surgery.  Her back surgery was on 12/03/2020.  Current Medications:  Current Facility-Administered Medications:  .  0.9 %  sodium chloride infusion, , Intravenous, Continuous, Zierle-Ghosh, Asia B, DO, Last Rate: 75 mL/hr at 01/21/21 0500, Infusion Verify at 01/21/21 0500 .  acetaminophen (TYLENOL) tablet 650 mg, 650 mg, Oral, Q6H PRN, 650 mg at 01/17/21 1635 **OR** acetaminophen (TYLENOL) suppository 650 mg, 650 mg, Rectal, Q6H PRN, Zierle-Ghosh, Asia B, DO .  aspirin EC tablet 81 mg, 81 mg, Oral, Daily, Zierle-Ghosh, Asia B, DO, 81 mg at 01/21/21 1030 .  cyclobenzaprine (FLEXERIL) tablet 5 mg, 5 mg, Oral, BID PRN, Cristal Deer, MD, 5 mg at 01/20/21 2159 .  feeding supplement (GLUCERNA SHAKE) (GLUCERNA SHAKE) liquid 237 mL, 237 mL, Oral, TID BM, Shah, Pratik D, DO, 237 mL at 01/21/21 1044 .  gabapentin (NEURONTIN) capsule 300 mg, 300 mg, Oral, TID, Zierle-Ghosh, Asia B, DO, 300 mg at 01/21/21 1623 .  heparin injection 5,000 Units, 5,000 Units, Subcutaneous, Q8H, Zierle-Ghosh, Asia B, DO, 5,000 Units at 01/21/21 1438 .  insulin aspart (novoLOG) injection 0-15 Units, 0-15 Units, Subcutaneous, TID WC, Zierle-Ghosh, Asia B, DO, 2 Units at 01/21/21 1222 .  insulin aspart (novoLOG) injection 0-5 Units, 0-5 Units, Subcutaneous, QHS, Zierle-Ghosh, Asia B, DO .  insulin detemir (LEVEMIR) injection 20 Units, 20 Units, Subcutaneous, QHS, Cristal Deer, MD, 20 Units at 01/20/21 2310 .  loperamide (IMODIUM) capsule 4 mg, 4 mg, Oral, BID, Manuella Ghazi, Pratik D, DO, 4 mg at 01/21/21 1030 .  LORazepam (ATIVAN) tablet 1 mg, 1 mg, Oral,  Daily PRN, Zierle-Ghosh, Asia B, DO .  meclizine (ANTIVERT) tablet 12.5 mg, 12.5 mg, Oral, TID, Zierle-Ghosh, Asia B, DO, 12.5 mg at 01/21/21 1628 .  melatonin tablet 9 mg, 9 mg, Oral, QHS, Cristal Deer, MD, 9 mg at 01/20/21 2159 .  morphine 2 MG/ML injection 2 mg, 2 mg, Intravenous, Q2H PRN, Zierle-Ghosh, Asia B, DO, 2 mg at 01/17/21 0604 .  multivitamin with minerals tablet 1 tablet, 1 tablet, Oral, Daily, Manuella Ghazi, Pratik D, DO, 1 tablet at 01/21/21 1031 .  ondansetron (ZOFRAN) tablet 4 mg, 4 mg, Oral, Q6H PRN **OR** ondansetron (ZOFRAN) injection 4 mg, 4 mg, Intravenous, Q6H PRN, Zierle-Ghosh, Asia B, DO .  oxyCODONE (Oxy IR/ROXICODONE) immediate release tablet 5 mg, 5 mg, Oral, Q4H PRN, Zierle-Ghosh, Asia B, DO, 5 mg at 01/18/21 0848 .  oxyCODONE (OXYCONTIN) 12 hr tablet 10 mg, 10 mg, Oral, Q12H, Zierle-Ghosh, Asia B, DO, 10 mg at 01/21/21 1032 .  pantoprazole (PROTONIX) EC tablet 40 mg, 40 mg, Oral, Daily, Zierle-Ghosh, Asia B, DO, 40 mg at 01/21/21 1031 .  potassium chloride SA (KLOR-CON) CR tablet 40 mEq, 40 mEq, Oral, BID, Manuella Ghazi, Pratik D, DO, 40 mEq at 01/21/21 1031 .  rosuvastatin (CRESTOR) tablet 20 mg, 20 mg, Oral, Daily, Zierle-Ghosh, Asia B, DO, 20 mg at 01/21/21 1032 .  temazepam (RESTORIL) capsule 15 mg, 15 mg, Oral, QHS, Zierle-Ghosh, Asia B, DO, 15 mg at 01/20/21 2200 .  traZODone (DESYREL) tablet 100 mg, 100 mg, Oral, QHS, Zierle-Ghosh, Asia B, DO,  100 mg at 01/20/21 2159 .  venlafaxine XR (EFFEXOR-XR) 24 hr capsule 75 mg, 75 mg, Oral, Q breakfast, Zierle-Ghosh, Asia B, DO, 75 mg at 01/21/21 0859   Objective: Blood pressure (!) 88/54, pulse 90, temperature 98.5 F (36.9 C), temperature source Oral, resp. rate 20, height 5' 5"  (1.651 m), weight 91.2 kg, SpO2 100 %. Patient is alert and in no acute distress. Lungs are clear to auscultation. Abdomen is full.  She has left lower paramedian scar.  Bowel sounds are hyperactive.  Palpation reveals mild generalized tenderness which is  more pronounced in left and right lower quadrant of her abdomen.  No organomegaly or masses. No LE edema or clubbing noted.  Labs/studies Results:  CBC Latest Ref Rng & Units 01/20/2021 01/17/2021 01/16/2021  WBC 4.0 - 10.5 K/uL 12.0(H) 12.7(H) 12.4(H)  Hemoglobin 12.0 - 15.0 g/dL 12.8 14.9 16.0(H)  Hematocrit 36.0 - 46.0 % 39.4 43.8 46.6(H)  Platelets 150 - 400 K/uL 314 350 421(H)    CMP Latest Ref Rng & Units 01/21/2021 01/20/2021 01/18/2021  Glucose 70 - 99 mg/dL 117(H) 108(H) 71  BUN 8 - 23 mg/dL 8 9 7(L)  Creatinine 0.44 - 1.00 mg/dL 0.76 0.76 0.81  Sodium 135 - 145 mmol/L 144 144 145  Potassium 3.5 - 5.1 mmol/L 3.4(L) 3.0(L) 3.6  Chloride 98 - 111 mmol/L 112(H) 112(H) 114(H)  CO2 22 - 32 mmol/L 24 23 24   Calcium 8.9 - 10.3 mg/dL 8.8(L) 9.0 9.2  Total Protein 6.5 - 8.1 g/dL - - -  Total Bilirubin 0.3 - 1.2 mg/dL - - -  Alkaline Phos 38 - 126 U/L - - -  AST 15 - 41 U/L - - -  ALT 0 - 44 U/L - - -    Hepatic Function Latest Ref Rng & Units 01/17/2021 01/16/2021 12/19/2020  Total Protein 6.5 - 8.1 g/dL 7.1 8.0 7.4  Albumin 3.5 - 5.0 g/dL 3.6 4.1 3.5  AST 15 - 41 U/L 27 34 18  ALT 0 - 44 U/L 25 29 15   Alk Phosphatase 38 - 126 U/L 78 89 116  Total Bilirubin 0.3 - 1.2 mg/dL 0.5 0.7 0.7  Bilirubin, Direct 0.00 - 0.40 mg/dL - - -    Lab Results  Component Value Date   CRP 0.6 11/01/2019    Stool electrolytes are pending. Random cortisol level is 5.7 Tissue transglutaminase IgA antibody pending   Assessment:  #1.  Diarrhea.  Diarrhea started 2 days after back surgery about 7 weeks ago.  GI pathogen panel and C. difficile stool antigen and toxin negative.  She did not respond to 12 to 16 mg of loperamide at home.  While colitis needs to be ruled out it is possible that we are dealing with diabetic or secretory diarrhea.  #2.  Borderline random cortisol level.  Will check fasting cortisol level tomorrow.  #3.  History of advanced colonic adenomas.  Last colonoscopy was in September  2021 and she is due for one now but will have to wait until diarrhea has resolved.  #4.  Back pain radiating to left leg.  MR this afternoon revealed fusion at L4-5 and L5-S1 and minor bulges at L3-L4.  Recommendations  Also send stool specimen for osmolality while waiting for electrolytes. Record stool volume. Fecal lactoferrin. Possible flexible sigmoidoscopy in a.m. Will stop heparin tomorrow 5 hours before procedure.

## 2021-01-22 ENCOUNTER — Inpatient Hospital Stay (HOSPITAL_COMMUNITY): Payer: BC Managed Care – PPO | Admitting: Anesthesiology

## 2021-01-22 ENCOUNTER — Encounter (HOSPITAL_COMMUNITY): Payer: Self-pay | Admitting: Family Medicine

## 2021-01-22 ENCOUNTER — Encounter (HOSPITAL_COMMUNITY)
Admission: EM | Disposition: A | Discharge status: Home / Self Care | Payer: Self-pay | Source: Home / Self Care | Attending: Internal Medicine

## 2021-01-22 HISTORY — PX: FLEXIBLE SIGMOIDOSCOPY: SHX5431

## 2021-01-22 HISTORY — PX: BIOPSY: SHX5522

## 2021-01-22 LAB — GLUCOSE, CAPILLARY
Glucose-Capillary: 101 mg/dL — ABNORMAL HIGH (ref 70–99)
Glucose-Capillary: 103 mg/dL — ABNORMAL HIGH (ref 70–99)
Glucose-Capillary: 101 mg/dL — ABNORMAL HIGH (ref 70–99)
Glucose-Capillary: 81 mg/dL (ref 70–99)
Glucose-Capillary: 111 mg/dL — ABNORMAL HIGH (ref 70–99)

## 2021-01-22 LAB — BASIC METABOLIC PANEL WITH GFR
Anion gap: 7 (ref 5–15)
BUN: 11 mg/dL (ref 8–23)
CO2: 26 mmol/L (ref 22–32)
Calcium: 9 mg/dL (ref 8.9–10.3)
Chloride: 111 mmol/L (ref 98–111)
Creatinine, Ser: 0.93 mg/dL (ref 0.44–1.00)
GFR, Estimated: >60 mL/min
Glucose, Bld: 116 mg/dL — ABNORMAL HIGH (ref 70–99)
Potassium: 4.7 mmol/L (ref 3.5–5.1)
Sodium: 144 mmol/L (ref 135–145)

## 2021-01-22 LAB — MAGNESIUM: Magnesium: 1.8 mg/dL (ref 1.7–2.4)

## 2021-01-22 SURGERY — SIGMOIDOSCOPY, FLEXIBLE
Anesthesia: General

## 2021-01-22 MED ORDER — LIDOCAINE HCL (CARDIAC) PF 100 MG/5ML IV SOSY
PREFILLED_SYRINGE | INTRAVENOUS | Status: DC | PRN
  Administered 2021-01-22: 50 mg via INTRAVENOUS

## 2021-01-22 MED ORDER — PROPOFOL 10 MG/ML IV BOLUS
INTRAVENOUS | Status: DC | PRN
  Administered 2021-01-22: 150 mg via INTRAVENOUS
  Administered 2021-01-22: 50 mg via INTRAVENOUS

## 2021-01-22 MED ORDER — MIDODRINE HCL 5 MG PO TABS
5.0000 mg | ORAL_TABLET | Freq: Three times a day (TID) | ORAL | Status: DC
  Administered 2021-01-22 – 2021-01-23 (×5): 5 mg via ORAL
  Filled 2021-01-22 (×5): qty 1

## 2021-01-22 MED ORDER — SODIUM CHLORIDE 0.9 % IV SOLN: INTRAVENOUS | Status: DC

## 2021-01-22 MED ORDER — STERILE WATER FOR IRRIGATION IR SOLN
Status: DC | PRN
  Administered 2021-01-22: 1.5 mL

## 2021-01-22 MED ORDER — LACTATED RINGERS IV SOLN: INTRAVENOUS | Status: DC

## 2021-01-22 NOTE — Transfer of Care (Signed)
Immediate Anesthesia Transfer of Care Note  Patient: Annette [name]  Procedure(s) Performed: FLEXIBLE SIGMOIDOSCOPY (N/A ) BIOPSY  Patient Location: PACU  Anesthesia Type:General  Level of Consciousness: awake  Airway & Oxygen Therapy: Patient Spontanous Breathing  Post-op Assessment: Report given to RN and Post -op Vital signs reviewed and stable  Post vital signs: Reviewed and stable  Last Vitals:  Vitals Value Taken Time  BP    Temp    Pulse    Resp    SpO2      Last Pain:  Vitals:   01/22/21 1526  TempSrc:   PainSc: 4       Patients Stated Pain Goal: 0 (49/44/96 7591)  Complications: No complications documented.

## 2021-01-22 NOTE — Op Note (Signed)
[facility] Patient Name: Genola Yuille Procedure Date: 01/22/2021 3:10 PM MRN: [id number] Date of Birth: 03-Aug-1961 Attending MD: Norvel Richards , MD CSN: [id number] Age: 60 Admit Type: Outpatient Procedure:                Flexible Sigmoidoscopy Indications:              Diarrhea Providers:                Norvel Richards, MD, Caprice Kluver, Aram Candela Referring MD:              Medicines:                Propofol per Anesthesia Complications:            No immediate complications. Estimated Blood Loss:     Estimated blood loss was minimal. Procedure:                Pre-Anesthesia Assessment:                           - Prior to the procedure, a History and Physical                            was performed, and patient medications and                            allergies were reviewed. The patient's tolerance of                            previous anesthesia was also reviewed. The risks                            and benefits of the procedure and the sedation                            options and risks were discussed with the patient.                            All questions were answered, and informed consent                            was obtained. Prior Anticoagulants: The patient has                            taken no previous anticoagulant or antiplatelet                            agents. ASA Grade Assessment: III - A patient with                            severe systemic disease. After reviewing the risks                            and benefits, the patient was deemed in  satisfactory condition to undergo the procedure.                           After obtaining informed consent, the scope was                            passed under direct vision. The [id number] ([id number])                            scope was introduced through the anus and advanced                            to the the sigmoid colon. The flexible                             sigmoidoscopy was accomplished without difficulty.                            The patient tolerated the procedure well. The                            quality of the bowel preparation was fair. Scope In: 3:31:49 PM Scope Out: 3:38:30 PM Total Procedure Duration: 0 hours 6 minutes 41 seconds  Findings:      DRE revealed good sphincter tone. Please note there was formed stool in       the rectum trailing well up into the proximal sigmoid. I advanced the       scope in a nice one-to-one fashion to approximately 40 cm (likely to the       proximal sigmoid). Formed stool all the way with no liquid stool       present. Although all the mucosa of the segment of the lower GI tract       was not seen, a good bit was seen and it appeared entirely normal. Since       the patient had formed stool, no attempt at aspirating a stool sample       was undertaken (see photos). I did take 2 biopsies from the sigmoid       segment. This was with done without difficulty or apparent complication. Impression:               - Sigmoidoscopy to 40 cm revealed normal-appearing                            colonic mucosa and entirely formed stool throughout                            the rectum and sigmoid segments.                           - Status post sigmoid biopsy. Endoscopic findings                            and patient's presentation are discordant. I saw no  evidence of inflammation today and certainly formed                            stool found not consistent with an                            ongoing/inflammatory process (at least involving                            the - - distal lower GI tract).                           - It is interesting patient notes the onset of                            loose bowels and unrelenting lower extremity pain                            occurring at the same time immediately following                            her spine surgery. I  Wonder if somehow she has                            suffered a neurogenic insult to her colon. Moderate Sedation:      Moderate (conscious) sedation was personally administered by an       anesthesia professional. The following parameters were monitored: oxygen       saturation, heart rate, blood pressure, respiratory rate, EKG, adequacy       of pulmonary ventilation, and response to care. Recommendation:           - Return patient to hospital ward for observation.                            Observe clinically. Advance diet as tolerated.                            Patient will still need to return within the next                            year for early surveillance colonoscopy. Further                            recommendations to follow once pathology report is                            available for review (although significant findings                            unlikely) Procedure Code(s):        --- Professional ---                           [phone], Sigmoidoscopy, flexible; diagnostic,  including collection of specimen(s) by brushing or                            washing, when performed (separate procedure) Diagnosis Code(s):        --- Professional ---                           R19.7, Diarrhea, unspecified CPT copyright 2019 [employer]. All rights reserved. The codes documented in this report are preliminary and upon coder review may  be revised to meet current compliance requirements. Cristopher Estimable. Isabella Roemmich, MD Norvel Richards, MD 01/22/2021 4:23:52 PM This report has been signed electronically. Number of Addenda: 0

## 2021-01-22 NOTE — Progress Notes (Signed)
PROGRESS NOTE    Annette [name]  PTW:[id number] DOB: Jan 29, 1961 DOA: 01/16/2021 PCP: Fayrene Helper, MD   Brief Narrative:   Patient admitted with chronic diarrhea since March 2022, she presented to the ED with dizziness and syncope.Patient seen and examined at bedside she stated she still having diarrhea she feels quite tired.She continues have ongoing diarrhea which is a chronic issue for her. C. difficile testing negative. Finally she was noted to have associated syncope with orthostatic hypotension secondarily to this.She continues to have ongoing diarrheawithout improvement and further GI workup pending with flex sigmoidoscopy.  Assessment & Plan:   Principal Problem:   Syncope Active Problems:   Type 2 diabetes mellitus with other specified complication (HCC)   Hypokalemia   Intractable pain   Leukocytosis   Diarrhea   History of colonic polyps   Syncope with associated orthostasis -Secondary to volume depletion related to persistent diarrhea -Continue IV fluid -Plan to repeat orthostatics once blood pressure stabilizes -Started on midodrine 4/21  Acute on chronic diarrhea -May be related IBS -GI panel negative -Cdiff negative -Schedule Imodiumwhich is not helping -Appreciate GI evaluation with plans for sigmoidoscopy 4/21 -Recheck am labs  Asymptomatic hypotension-related to above -Hold home bp meds -Start on midodrine TID on 4/21  Type 2 DM -Controlled -Continue SSI and Lantus  Leukocytosis -Likely reactive secondary to above -Procalcitonin low -Monitor CBC in am  Back pain with recent surgery -MRI lumbar spine with no acute findings -Follow up with Neurosurgery Dr. Christella Noa in outpatient setting  DVT prophylaxis:Heparin Code Status:Full Family Communication:None at bedside Disposition Plan: Status is: Inpatient  Remains inpatient appropriate because:IV treatments appropriate due to intensity of illness or inability to  take PO and Inpatient level of care appropriate due to severity of illness   Dispo: The patient is from:Home Anticipated d/c is ZG:YFVC Patient currently is not medically stable to d/c. Difficult to place patient No  Nutritional Assessment:  The patient's BMI is:Body mass index is 33.45 kg/m.Marland Kitchen  Seen by dietician. I agree with the assessment and plan as outlined below:  Nutrition Status: Nutrition Problem: Predicted suboptimal nutrient intake Etiology: decreased appetite Signs/Symptoms: other (comment) (per MST report) Interventions: MVI,Glucerna shake  .Consultants:  GI  Procedures:  See below  Antimicrobials:  None  Subjective: Patient seen and evaluated today with ongoing diarrhea noted, similar to prior day. She has been NPO for flex sigmoidoscopy today.  Objective: Vitals:   01/21/21 2151 01/22/21 0452 01/22/21 0500 01/22/21 0632  BP: (!) 90/55 (!) 81/51    Pulse: 91 (!) 52    Resp: 20 16    Temp: 98.6 F (37 C) 98.4 F (36.9 C)    TempSrc: Oral Oral    SpO2: 99% 92% 99% 96%  Weight:      Height:        Intake/Output Summary (Last 24 hours) at 01/22/2021 1010 Last data filed at 01/22/2021 0900 Gross per 24 hour  Intake 1380 ml  Output 950 ml  Net 430 ml   Filed Weights   01/16/21 1724  Weight: 91.2 kg    Examination:  General exam: Appears calm and comfortable  Respiratory system: Clear to auscultation. Respiratory effort normal. Cardiovascular system: S1 & S2 heard, RRR.  Gastrointestinal system: Abdomen is soft Central nervous system: Alert and awake Extremities: No edema Skin: No significant lesions noted Psychiatry: Flat affect.    Data Reviewed: I have personally reviewed following labs and imaging studies  CBC: Recent Labs  Lab 01/16/21 1816 01/17/21  0510 01/20/21 0552  WBC 12.4* 12.7* 12.0*  NEUTROABS 7.0 5.8  --   HGB 16.0* 14.9 12.8  HCT 46.6* 43.8 39.4  MCV 96.9  98.9 101.5*  PLT 421* 350 353   Basic Metabolic Panel: Recent Labs  Lab 01/17/21 0510 01/18/21 0525 01/20/21 0552 01/21/21 0554 01/22/21 0511  NA 138 145 144 144 144  K 2.8* 3.6 3.0* 3.4* 4.7  CL 105 114* 112* 112* 111  CO2 21* 24 23 24 26   GLUCOSE 90 71 108* 117* 116*  BUN 11 7* 9 8 11   CREATININE 0.87 0.81 0.76 0.76 0.93  CALCIUM 8.8* 9.2 9.0 8.8* 9.0  MG 1.6* 1.7 1.6* 1.7 1.8   GFR: Estimated Creatinine Clearance: 70.9 mL/min (by C-G formula based on SCr of 0.93 mg/dL). Liver Function Tests: Recent Labs  Lab 01/16/21 1816 01/17/21 0510  AST 34 27  ALT 29 25  ALKPHOS 89 78  BILITOT 0.7 0.5  PROT 8.0 7.1  ALBUMIN 4.1 3.6   Recent Labs  Lab 01/16/21 1816  LIPASE 86*   No results for input(s): AMMONIA in the last 168 hours. Coagulation Profile: No results for input(s): INR, PROTIME in the last 168 hours. Cardiac Enzymes: No results for input(s): CKTOTAL, CKMB, CKMBINDEX, TROPONINI in the last 168 hours. BNP (last 3 results) No results for input(s): PROBNP in the last 8760 hours. HbA1C: No results for input(s): HGBA1C in the last 72 hours. CBG: Recent Labs  Lab 01/21/21 0754 01/21/21 1119 01/21/21 1603 01/21/21 2202 01/22/21 0740  GLUCAP 106* 147* 116* 145* 101*   Lipid Profile: No results for input(s): CHOL, HDL, LDLCALC, TRIG, CHOLHDL, LDLDIRECT in the last 72 hours. Thyroid Function Tests: No results for input(s): TSH, T4TOTAL, FREET4, T3FREE, THYROIDAB in the last 72 hours. Anemia Panel: No results for input(s): VITAMINB12, FOLATE, FERRITIN, TIBC, IRON, RETICCTPCT in the last 72 hours. Sepsis Labs: Recent Labs  Lab 01/19/21 1255  PROCALCITON <0.10    Recent Results (from the past 240 hour(s))  C Difficile Quick Screen w PCR reflex     Status: None   Collection Time: 01/16/21 10:40 PM   Specimen: STOOL  Result Value Ref Range Status   C Diff antigen NEGATIVE NEGATIVE Final   C Diff toxin NEGATIVE NEGATIVE Final   C Diff interpretation No  C. difficile detected.  Final    Comment: Performed at [facility], [address], [place], [place] [address]  Resp Panel by RT-PCR (Flu A&B, Covid) Nasopharyngeal Swab     Status: None   Collection Time: 01/17/21 12:50 AM   Specimen: Nasopharyngeal Swab; Nasopharyngeal(NP) swabs in vial transport medium  Result Value Ref Range Status   SARS Coronavirus 2 by RT PCR NEGATIVE NEGATIVE Final    Comment: (NOTE) SARS-CoV-2 target nucleic acids are NOT DETECTED.  The SARS-CoV-2 RNA is generally detectable in upper respiratory specimens during the acute phase of infection. The lowest concentration of SARS-CoV-2 viral copies this assay can detect is 138 copies/mL. A negative result does not preclude SARS-Cov-2 infection and should not be used as the sole basis for treatment or other patient management decisions. A negative result may occur with  improper specimen collection/handling, submission of specimen other than nasopharyngeal swab, presence of viral mutation(s) within the areas targeted by this assay, and inadequate number of viral copies(<138 copies/mL). A negative result must be combined with clinical observations, patient history, and epidemiological information. The expected result is Negative.  Fact Sheet for Patients:  EntrepreneurPulse.com.au  Fact Sheet for Healthcare  Providers:  IncredibleEmployment.be  This test is no t yet approved or cleared by the [place] and  has been authorized for detection and/or diagnosis of SARS-CoV-2 by FDA under an Emergency Use Authorization (EUA). This EUA will remain  in effect (meaning this test can be used) for the duration of the COVID-19 declaration under Section 564(b)(1) of the Act, 21 U.S.C.section 360bbb-3(b)(1), unless the authorization is terminated  or revoked sooner.       Influenza A by PCR NEGATIVE NEGATIVE Final   Influenza B by PCR NEGATIVE NEGATIVE Final    Comment:  (NOTE) The Xpert Xpress SARS-CoV-2/FLU/RSV plus assay is intended as an aid in the diagnosis of influenza from Nasopharyngeal swab specimens and should not be used as a sole basis for treatment. Nasal washings and aspirates are unacceptable for Xpert Xpress SARS-CoV-2/FLU/RSV testing.  Fact Sheet for Patients: EntrepreneurPulse.com.au  Fact Sheet for Healthcare Providers: IncredibleEmployment.be  This test is not yet approved or cleared by the [place] FDA and has been authorized for detection and/or diagnosis of SARS-CoV-2 by FDA under an Emergency Use Authorization (EUA). This EUA will remain in effect (meaning this test can be used) for the duration of the COVID-19 declaration under Section 564(b)(1) of the Act, 21 U.S.C. section 360bbb-3(b)(1), unless the authorization is terminated or revoked.  Performed at [facility], [address] [place], [place] [address]   Gastrointestinal Panel by PCR , Stool     Status: None   Collection Time: 01/19/21 10:22 AM   Specimen: Stool  Result Value Ref Range Status   Campylobacter species NOT DETECTED NOT DETECTED Final   Plesimonas shigelloides NOT DETECTED NOT DETECTED Final   Salmonella species NOT DETECTED NOT DETECTED Final   Yersinia enterocolitica NOT DETECTED NOT DETECTED Final   Vibrio species NOT DETECTED NOT DETECTED Final   Vibrio cholerae NOT DETECTED NOT DETECTED Final   Enteroaggregative E coli (EAEC) NOT DETECTED NOT DETECTED Final   Enteropathogenic E coli (EPEC) NOT DETECTED NOT DETECTED Final   Enterotoxigenic E coli (ETEC) NOT DETECTED NOT DETECTED Final   Shiga like toxin producing E coli (STEC) NOT DETECTED NOT DETECTED Final   Shigella/Enteroinvasive E coli (EIEC) NOT DETECTED NOT DETECTED Final   Cryptosporidium NOT DETECTED NOT DETECTED Final   Cyclospora cayetanensis NOT DETECTED NOT DETECTED Final   Entamoeba histolytica NOT DETECTED NOT DETECTED Final   Giardia  lamblia NOT DETECTED NOT DETECTED Final   Adenovirus F40/41 NOT DETECTED NOT DETECTED Final   Astrovirus NOT DETECTED NOT DETECTED Final   Norovirus GI/GII NOT DETECTED NOT DETECTED Final   Rotavirus A NOT DETECTED NOT DETECTED Final   Sapovirus (I, II, IV, and V) NOT DETECTED NOT DETECTED Final    Comment: Performed at [facility], [address], [place], [place] [address]         Radiology Studies: MR Lumbar Spine W Wo Contrast  Result Date: 01/21/2021 CLINICAL DATA:  Low back pain.  Previous fusion. EXAM: MRI LUMBAR SPINE WITHOUT AND WITH CONTRAST TECHNIQUE: Multiplanar and multiecho pulse sequences of the lumbar spine were obtained without and with intravenous contrast. CONTRAST:  77mL GADAVIST GADOBUTROL 1 MMOL/ML IV SOLN COMPARISON:  Radiography 12/18/2020.  MRI 05/24/2020. FINDINGS: Segmentation:  5 lumbar type vertebral bodies. Alignment:  Normal Vertebrae: No primary bone process. Previous discectomy and fusion procedure L4-5 and L5-S1. Conus medullaris and cauda equina: Conus extends to the L1 level. Conus and cauda equina appear normal. Paraspinal and other soft tissues: Negative Disc levels:  No abnormality at L2-3 or above. L3-4: Minimal desiccation and bulging of the disc. No stenosis or neural compression. L4-5: Previous anterior discectomy and fusion procedure. Sufficient patency of the canal and foramina. L5-S1: Previous anterior discectomy and fusion procedure. Sufficient patency of the canal and foramina. IMPRESSION: 1. Good appearance at the anterior discectomy and fusion levels of L4-5 and L5-S1. 2. L3-4 minor disc bulge. No stenosis or neural compression. Electronically Signed   By: Nelson Chimes M.D.   On: 01/21/2021 16:34        Scheduled Meds: . aspirin EC  81 mg Oral Daily  . feeding supplement (GLUCERNA SHAKE)  237 mL Oral TID BM  . gabapentin  300 mg Oral TID  . insulin aspart  0-15 Units Subcutaneous TID WC  . insulin aspart  0-5 Units Subcutaneous  QHS  . insulin detemir  20 Units Subcutaneous QHS  . loperamide  4 mg Oral BID  . meclizine  12.5 mg Oral TID  . melatonin  9 mg Oral QHS  . midodrine  5 mg Oral TID WC  . multivitamin with minerals  1 tablet Oral Daily  . oxyCODONE  10 mg Oral Q12H  . pantoprazole  40 mg Oral Daily  . rosuvastatin  20 mg Oral Daily  . sodium phosphate  2 enema Rectal Once  . temazepam  15 mg Oral QHS  . traZODone  100 mg Oral QHS  . venlafaxine XR  75 mg Oral Q breakfast   Continuous Infusions: . sodium chloride 75 mL/hr at 01/22/21 0555     LOS: 4 days    Time spent: 35 minutes    Tameshia Bonneville Darleen Crocker, DO [facility]  If 7PM-7AM, please contact night-coverage [email or web address] 01/22/2021, 10:10 AM

## 2021-01-22 NOTE — Anesthesia Preprocedure Evaluation (Signed)
Anesthesia Evaluation  Patient identified by MRN, date of birth, ID band Patient awake    Reviewed: Allergy & Precautions, H&P , NPO status , Patient's Chart, lab work & pertinent test results, reviewed documented beta blocker date and time   Airway Mallampati: II  TM Distance: >3 FB Neck ROM: full    Dental no notable dental hx. (+) Teeth Intact   Pulmonary sleep apnea , Current Smoker,    Pulmonary exam normal breath sounds clear to auscultation       Cardiovascular Exercise Tolerance: Good hypertension,  Rhythm:regular Rate:Normal     Neuro/Psych  Headaches, PSYCHIATRIC DISORDERS Anxiety Depression    GI/Hepatic Neg liver ROS, GERD  Medicated,  Endo/Other  diabetesHypothyroidism   Renal/GU negative Renal ROS  negative genitourinary   Musculoskeletal   Abdominal   Peds  Hematology  (+) Blood dyscrasia, anemia ,   Anesthesia Other Findings   Reproductive/Obstetrics negative OB ROS                             Anesthesia Physical Anesthesia Plan  ASA: II  Anesthesia Plan: General   Post-op Pain Management:    Induction:   PONV Risk Score and Plan: Propofol infusion  Airway Management Planned:   Additional Equipment:   Intra-op Plan:   Post-operative Plan:   Informed Consent: I have reviewed the patients History and Physical, chart, labs and discussed the procedure including the risks, benefits and alternatives for the proposed anesthesia with the patient or authorized representative who has indicated his/her understanding and acceptance.     Dental Advisory Given  Plan Discussed with: CRNA  Anesthesia Plan Comments:         Anesthesia Quick Evaluation

## 2021-01-22 NOTE — Interval H&P Note (Signed)
History and Physical Interval Note:  01/22/2021 3:23 PM  Annette [name]  has presented today for surgery, with the diagnosis of Unexplained diarrhea.  The various methods of treatment have been discussed with the patient and family. After consideration of risks, benefits and other options for treatment, the patient has consented to  Procedure(s): FLEXIBLE SIGMOIDOSCOPY (N/A) as a surgical intervention.  The patient's history has been reviewed, patient examined, no change in status, stable for surgery.  I have reviewed the patient's chart and labs.  Questions were answered to the patient's satisfaction.     Manus Rudd  Patient seen and examined in short stay.  Agree with need for sigmoidoscopy with further stool sampling/biopsy as appropriate.  Risk and benefits, limitations have been reviewed with Ms. Megan Salon.  She understands that this does not replace the need for an early follow-up colonoscopy given the advanced adenomas removed recently from her colon. Further recommendations to follow after sigmoidoscopy has been performed.

## 2021-01-22 NOTE — Anesthesia Procedure Notes (Signed)
Date/Time: 01/22/2021 3:34 PM Performed by: Orlie Dakin, CRNA Pre-anesthesia Checklist: Patient identified, Emergency Drugs available, Suction available and Patient being monitored Patient Re-evaluated:Patient Re-evaluated prior to induction Oxygen Delivery Method: Nasal cannula Induction Type: IV induction Placement Confirmation: positive ETCO2

## 2021-01-22 NOTE — Anesthesia Postprocedure Evaluation (Signed)
Anesthesia Post Note  Patient: Annette [name]  Procedure(s) Performed: FLEXIBLE SIGMOIDOSCOPY (N/A ) BIOPSY  Patient location during evaluation: Phase II Anesthesia Type: General Level of consciousness: awake Pain management: pain level controlled Vital Signs Assessment: post-procedure vital signs reviewed and stable Respiratory status: spontaneous breathing and respiratory function stable Cardiovascular status: blood pressure returned to baseline and stable Postop Assessment: no headache and no apparent nausea or vomiting Anesthetic complications: no Comments: Late entry   No complications documented.   Last Vitals:  Vitals:   01/22/21 1402 01/22/21 1420  BP: (!) 84/59 (!) 101/48  Pulse: 86 79  Resp: 19 10  Temp: 36.7 C 36.7 C  SpO2: 99% 99%    Last Pain:  Vitals:   01/22/21 1526  TempSrc:   PainSc: [place]

## 2021-01-23 ENCOUNTER — Encounter (HOSPITAL_COMMUNITY): Payer: Self-pay | Admitting: Internal Medicine

## 2021-01-23 ENCOUNTER — Telehealth: Payer: Self-pay | Admitting: Nurse Practitioner

## 2021-01-23 LAB — BASIC METABOLIC PANEL
Anion gap: 8 (ref 5–15)
BUN: 12 mg/dL (ref 8–23)
CO2: 26 mmol/L (ref 22–32)
Calcium: 9 mg/dL (ref 8.9–10.3)
Chloride: 109 mmol/L (ref 98–111)
Creatinine, Ser: 0.78 mg/dL (ref 0.44–1.00)
GFR, Estimated: >60 mL/min (ref >60)
Glucose, Bld: 127 mg/dL — ABNORMAL HIGH (ref 70–99)
Potassium: 3.9 mmol/L (ref 3.5–5.1)
Sodium: 143 mmol/L (ref 135–145)

## 2021-01-23 LAB — MAGNESIUM: Magnesium: 1.8 mg/dL (ref 1.7–2.4)

## 2021-01-23 LAB — GLUCOSE, CAPILLARY
Glucose-Capillary: 97 mg/dL (ref 70–99)
Glucose-Capillary: 153 mg/dL — ABNORMAL HIGH (ref 70–99)

## 2021-01-23 LAB — CBC
HCT: 37.4 % (ref 36.0–46.0)
Hemoglobin: 12.2 g/dL (ref 12.0–15.0)
MCH: 33.4 pg (ref 26.0–34.0)
MCHC: 32.6 g/dL (ref 30.0–36.0)
MCV: 102.5 fL — ABNORMAL HIGH (ref 80.0–100.0)
Platelets: 303 10*3/uL (ref 150–400)
RBC: 3.65 MIL/uL — ABNORMAL LOW (ref 3.87–5.11)
RDW: 13.5 % (ref 11.5–15.5)
WBC: 10.1 10*3/uL (ref 4.0–10.5)
nRBC: 0.4 % — ABNORMAL HIGH (ref 0.0–0.2)

## 2021-01-23 MED ORDER — MIDODRINE HCL 5 MG PO TABS: 5.0000 mg | ORAL_TABLET | Freq: Three times a day (TID) | ORAL | 2 refills | Status: AC

## 2021-01-23 MED ORDER — CYCLOBENZAPRINE HCL 10 MG PO TABS: 10.0000 mg | ORAL_TABLET | Freq: Three times a day (TID) | ORAL | 0 refills | Status: DC | PRN

## 2021-01-23 NOTE — Progress Notes (Signed)
Subjective: Feeling better today.  No diarrhea today.  Was previously having multiple stools a day, approximately 4-6.  Loose stools.  This all started after her spine surgery.  No other new GI complaints.  Objective: Vital signs in last 24 hours: Temp:  [97.6 F (36.4 C)-98.3 F (36.8 C)] 98.3 F (36.8 C) (04/22 0250) Pulse Rate:  [79-92] 82 (04/22 0250) Resp:  [6-19] 16 (04/22 0250) BP: (84-110)/(25-63) 84/44 (04/22 0800) SpO2:  [91 %-99 %] 94 % (04/22 0250) Last BM Date: 01/23/21 General:   Alert and oriented, pleasant Head:  Normocephalic and atraumatic. Eyes:  No icterus, sclera clear. Conjuctiva pink.  Heart:  S1, S2 present, no murmurs noted.  Lungs: Clear to auscultation bilaterally, without wheezing, rales, or rhonchi.  Abdomen:  Bowel sounds present, soft, non-tender, non-distended. No HSM or hernias noted. No rebound or guarding. No masses appreciated  Msk:  Symmetrical without gross deformities. Extremities:  Without clubbing or edema. Neurologic:  Alert and  oriented x4;  grossly normal neurologically. Psych:  Alert and cooperative. Normal mood and affect.  Intake/Output from previous day: 04/21 0701 - 04/22 0700 In: 920 [P.O.:720; I.V.:200] Out: 400 [Urine:400] Intake/Output this shift: Total I/O In: 360 [P.O.:360] Out: -   Lab Results: Recent Labs    01/23/21 0534  WBC 10.1  HGB 12.2  HCT 37.4  PLT 303   BMET Recent Labs    01/21/21 0554 01/22/21 0511 01/23/21 0534  NA 144 144 143  K 3.4* 4.7 3.9  CL 112* 111 109  CO2 24 26 26   GLUCOSE 117* 116* 127*  BUN 8 11 12   CREATININE 0.76 0.93 0.78  CALCIUM 8.8* 9.0 9.0   LFT No results for input(s): PROT, ALBUMIN, AST, ALT, ALKPHOS, BILITOT, BILIDIR, IBILI in the last 72 hours. PT/INR No results for input(s): LABPROT, INR in the last 72 hours. Hepatitis Panel No results for input(s): HEPBSAG, HCVAB, HEPAIGM, HEPBIGM in the last 72 hours.   Studies/Results: MR Lumbar Spine W Wo  Contrast  Result Date: 01/21/2021 CLINICAL DATA:  Low back pain.  Previous fusion. EXAM: MRI LUMBAR SPINE WITHOUT AND WITH CONTRAST TECHNIQUE: Multiplanar and multiecho pulse sequences of the lumbar spine were obtained without and with intravenous contrast. CONTRAST:  50mL GADAVIST GADOBUTROL 1 MMOL/ML IV SOLN COMPARISON:  Radiography 12/18/2020.  MRI 05/24/2020. FINDINGS: Segmentation:  5 lumbar type vertebral bodies. Alignment:  Normal Vertebrae: No primary bone process. Previous discectomy and fusion procedure L4-5 and L5-S1. Conus medullaris and cauda equina: Conus extends to the L1 level. Conus and cauda equina appear normal. Paraspinal and other soft tissues: Negative Disc levels: No abnormality at L2-3 or above. L3-4: Minimal desiccation and bulging of the disc. No stenosis or neural compression. L4-5: Previous anterior discectomy and fusion procedure. Sufficient patency of the canal and foramina. L5-S1: Previous anterior discectomy and fusion procedure. Sufficient patency of the canal and foramina. IMPRESSION: 1. Good appearance at the anterior discectomy and fusion levels of L4-5 and L5-S1. 2. L3-4 minor disc bulge. No stenosis or neural compression. Electronically Signed   By: Nelson Chimes M.D.   On: 01/21/2021 16:34    Assessment: Diarrhea-  Diarrhea started 2 days after back surgery about 7 weeks ago.  GI pathogen panel and C. difficile stool antigen and toxin negative.  She did not respond to 12 to 16 mg of loperamide at home.  Underwent flexible sigmoidoscopy yesterday which found normal-appearing colonic mucosa and entirely formed stool throughout the rectum and sigmoid segments, status post  sigmoid biopsy.  Overall felt endoscopic findings and patient presentation discordant, no inflammation.  Query neurological insult status post spinal surgery as contributing to her symptoms given unrelenting lower extremity pain and loose bowels status post surgery.  History of advanced colonic adenomas-   Last colonoscopy was in September 2021 and she is due for one now but will have to wait until diarrhea has resolved.  Plan outpatient colonoscopy.  Today she seems to be doing much better.  She is not had any diarrhea today.  She is happy about this.  Labs appear stable, mostly normal.  If she does have recurrent GI issues, due to possibility of neurological etiology after recent spine surgery, we could consider tricyclic antidepressant as possible chronic management option.  Plan: 1. Monitor for any recurrent diarrhea 2. Plan outpatient follow-up with GI at discharge 3. The patient is stable for discharge from a GI standpoint   We will sign off for now, will remove consult order. Please contact us and enter a new consult order if we can be of further assistance.  Thank you for allowing [place] to participate in the care of [place], [place], AGNP-C Adult & Gerontological Nurse Practitioner [facility]    LOS: 5 days    01/23/2021, 1:17 PM

## 2021-01-23 NOTE — Care Plan (Signed)
Pt off unit via wheelchair at this time with Probation officer.

## 2021-01-23 NOTE — Care Plan (Signed)
Pt given AVS at this time, pt denies questions. IV removed. Pt educated about medications, diet, and activity at this time. Husband at bedside.

## 2021-01-23 NOTE — Telephone Encounter (Signed)
Please schedule 4 week hospital follow-up.  Thanks!!

## 2021-01-23 NOTE — Discharge Summary (Addendum)
Physician Discharge Summary  Annette [name] [id number] DOB: 13-Jul-1961 DOA: 01/16/2021  PCP: Fayrene Helper, MD  Admit date: 01/16/2021  Discharge date: 01/23/2021  Admitted From:Home  Disposition:  Home  Recommendations for Outpatient Follow-up:  1. Follow up with PCP in 1-2 weeks 2. Follow-up with gastroenterology in 4 weeks as recommended, will be scheduled 3. Can use Imodium as needed for any persistent diarrhea 4. Continue on midodrine as prescribed and hold further antihypertensives 5. Follow-up blood pressure in the outpatient setting 6. Use compression stockings as prescribed  Home Health: None  Equipment/Devices:None  Discharge Condition:Stable  CODE STATUS: Full  Diet recommendation: Heart Healthy  Brief/Interim Summary: This is a 60 year old female with history of type 2 diabetes and recent history of low back surgery in the setting of chronic pain who was admitted with chronic diarrhea since March 2022. She presented to the ED with dizziness and syncope.Patient seen and examined at bedside she stated she still having diarrhea she feels quite tired.She continues have ongoing diarrhea which is a chronic issue for her. C. difficile testing negative. Finally she was noted to have associated syncope with orthostatic hypotension secondarily to this.  She was evaluated by GI and had flexible endoscopy performed during the course of this visit and was not noted to have any acute abnormalities.  She received IV fluid for several days and remained on scheduled Imodium.  No signs of infection were otherwise noted.  Her condition gradually resolved and her orthostatics improved as well with administration of IV fluid.  She is now tolerating diet and will follow up with GI in the outpatient setting.  In regards to her hypertension, she will remain off of her home antihypertensives and remain on midodrine as prescribed until further follow-up with her PCP.  Discharge  Diagnoses:  Principal Problem:   Syncope Active Problems:   Type 2 diabetes mellitus with other specified complication (HCC)   Hypokalemia   Intractable pain   Leukocytosis   Diarrhea   History of colonic polyps  Principal discharge diagnosis: Orthostatic syncope in the setting of acute on chronic diarrhea of unclear origin.  Discharge Instructions  Discharge Instructions    Diet - low sodium heart healthy   Complete by: As directed    Increase activity slowly   Complete by: As directed      Allergies as of 01/23/2021      Reactions   Penicillins Swelling   Has patient had a PCN reaction causing immediate rash, facial/tongue/throat swelling, SOB or lightheadedness with hypotension: Yes Has patient had a PCN reaction causing severe rash involving mucus membranes or skin necrosis: No Has patient had a PCN reaction that required hospitalization: No  Has patient had a PCN reaction occurring within the last 10 years: No If all of the above answers are "NO", then may proceed with Cephalosporin use. Of face and tongue.   Metformin And Related Diarrhea, Nausea Only   Pt to discontinue med due to intolerance, cramps and diarrhea   Wound Dressing Adhesive Itching, Rash   Dermabond and other surgical glues for incisions   Empagliflozin Other (See Comments)   Yeast infection Jardiance   Penicillin G Other (See Comments)   Statins Other (See Comments)   Fell bad but on Crestor   Zetia [ezetimibe] Other (See Comments)   Feels funny      Medication List    STOP taking these medications   Metoprolol Tartrate 37.5 MG Tabs   oxyCODONE 10 mg 12 hr tablet  Commonly known as: OXYCONTIN   oxyCODONE-acetaminophen 7.5-325 MG tablet Commonly known as: Percocet Replaced by: oxyCODONE-acetaminophen 5-325 MG tablet   triamterene-hydrochlorothiazide 37.5-25 MG tablet Commonly known as: MAXZIDE-25     TAKE these medications   aspirin 81 MG EC tablet Take 81 mg by mouth daily.    CLOTRIMAZOLE EX Apply 1 application topically daily as needed (itching an breast). 15 %   cyclobenzaprine 10 MG tablet Commonly known as: FLEXERIL Take 1 tablet (10 mg total) by mouth 3 (three) times daily as needed for muscle spasms.   ergocalciferol 1.25 MG (50000 UT) capsule Commonly known as: VITAMIN D2 Take 50,000 Units by mouth once a week.   fluconazole 150 MG tablet Commonly known as: DIFLUCAN TAKE ONE TABLET ONCE DAILY , AS NEEDED, FOR VAGINAL ITCH ASSOCIATED WITH ANTIBIOTIC USE What changed:   how much to take  how to take this  when to take this   gabapentin 100 MG capsule Commonly known as: NEURONTIN Take 2 capsules (200 mg total) by mouth 3 (three) times daily. In three days, increase to 300 mg TID   LORazepam 1 MG tablet Commonly known as: ATIVAN Take 1 mg by mouth daily as needed (Nausea).   Melatonin 10 MG Tabs Take 1 tablet by mouth daily.   midodrine 5 MG tablet Commonly known as: PROAMATINE Take 1 tablet (5 mg total) by mouth 3 (three) times daily with meals.   naproxen 500 MG tablet Commonly known as: NAPROSYN TAKE 1 TABLET(500 MG) BY MOUTH TWICE DAILY WITH A MEAL What changed: See the new instructions.   omeprazole 40 MG capsule Commonly known as: PRILOSEC Take 1 capsule (40 mg total) by mouth daily.   ondansetron 4 MG disintegrating tablet Commonly known as: Zofran ODT Take 1 tablet (4 mg total) by mouth every 8 (eight) hours as needed for nausea.   glucose blood test strip See admin instructions.   OneTouch Verio test strip Generic drug: glucose blood USE AS DIRECTED FOUR TIMES DAILY   oxyCODONE-acetaminophen 5-325 MG tablet Commonly known as: PERCOCET/ROXICET Take 1 tablet by mouth every 8 (eight) hours as needed for severe pain. Replaces: oxyCODONE-acetaminophen 7.5-325 MG tablet   Pen Needles 31G X 6 MM Misc See admin instructions.   rosuvastatin 20 MG tablet Commonly known as: CRESTOR Take 1 tablet (20 mg total) by mouth  daily.   temazepam 15 MG capsule Commonly known as: RESTORIL Take 1 capsule (15 mg total) by mouth at bedtime.   traZODone 100 MG tablet Commonly known as: DESYREL Take 1 tablet (100 mg total) by mouth at bedtime.   [name] 200 UNIT/ML FlexTouch Pen Generic drug: insulin degludec Inject 50 Units into the skin at bedtime. What changed: how much to take   Trulicity 1.5 0000000 Sopn Generic drug: Dulaglutide Inject 0.5 mLs (1.5 mg total) into the skin once a week.   venlafaxine XR 75 MG 24 hr capsule Commonly known as: EFFEXOR-XR TAKE 1 CAPSULE(75 MG) BY MOUTH DAILY What changed: See the new instructions.       Follow-up Information    [facility] EMERGENCY DEPARTMENT.   Specialty: Emergency Medicine Why: Return to the ER with any new or worsening symptoms Contact information: [address] [id number] Prudy Feeler [place] [id number] [phone]       Fayrene Helper, MD. Schedule an appointment as soon as possible for a visit in 1 week.   Specialty: Family Medicine Contact information: [address], [place] [address] [phone]  [facility] Follow up in 4 week(s).   Contact information: [place] [phone]             Allergies  Allergen Reactions  . Penicillins Swelling    Has patient had a PCN reaction causing immediate rash, facial/tongue/throat swelling, SOB or lightheadedness with hypotension: Yes Has patient had a PCN reaction causing severe rash involving mucus membranes or skin necrosis: No Has patient had a PCN reaction that required hospitalization: No  Has patient had a PCN reaction occurring within the last 10 years: No If all of the above answers are "NO", then may proceed with Cephalosporin use.   Of face and tongue.  . Metformin And Related Diarrhea and Nausea Only    Pt to discontinue med due to intolerance, cramps and  diarrhea  . Wound Dressing Adhesive Itching and Rash    Dermabond and other surgical glues for incisions  . Empagliflozin Other (See Comments)    Yeast infection  Jardiance  . Penicillin G Other (See Comments)  . Statins Other (See Comments)    Fell bad but on Crestor  . Zetia [Ezetimibe] Other (See Comments)    Feels funny    Consultations:  GI   Procedures/Studies: CT HEAD WO CONTRAST  Result Date: 01/16/2021 CLINICAL DATA:  Syncope EXAM: CT HEAD WITHOUT CONTRAST TECHNIQUE: Contiguous axial images were obtained from the base of the skull through the vertex without intravenous contrast. COMPARISON:  None. FINDINGS: Brain: No acute intracranial abnormality. Specifically, no hemorrhage, hydrocephalus, mass lesion, acute infarction, or significant intracranial injury. Vascular: No hyperdense vessel or unexpected calcification. Skull: No acute calvarial abnormality. Sinuses/Orbits: No acute findings Other: None IMPRESSION: No acute intracranial abnormality. Electronically Signed   By: Rolm Baptise M.D.   On: 01/16/2021 17:56   CT Angio Chest PE W and/or Wo Contrast  Result Date: 01/16/2021 CLINICAL DATA:  Nonlocalized acute abdominal pain., positive D-dimer with nausea, vomiting, diarrhea and dehydration. Surgery spine 12/03/2020. EXAM: CT ANGIOGRAPHY CHEST CT ABDOMEN AND PELVIS WITH CONTRAST TECHNIQUE: Multidetector CT imaging of the chest was performed using the standard protocol during bolus administration of intravenous contrast. Multiplanar CT image reconstructions and MIPs were obtained to evaluate the vascular anatomy. Multidetector CT imaging of the abdomen and pelvis was performed using the standard protocol during bolus administration of intravenous contrast. CONTRAST:  164mL OMNIPAQUE IOHEXOL 350 MG/ML SOLN COMPARISON:  CT lumbar spine 09/09/2020, CT chest 03/21/2020, CT abdomen pelvis 12/24/2013 FINDINGS: CTA CHEST FINDINGS Cardiovascular: Satisfactory opacification of the  pulmonary arteries to the segmental level. No evidence of pulmonary embolism. The main pulmonary artery is normal in caliber. Normal heart size. No significant pericardial effusion. The thoracic aorta is normal in caliber. Trace atherosclerotic plaque of the thoracic aorta. No coronary artery calcifications. Mediastinum/Nodes: Several prominent but nonenlarged bilateral hilar lymph nodes. Status post left axillary lymph node dissection. No enlarged mediastinal, hilar, or axillary lymph nodes. Status post left thyroidectomy. Otherwise the thyroid gland, trachea, and esophagus demonstrate no significant findings. Lungs/Pleura: No pulmonary nodule. No pulmonary mass. Bilateral lower lobe subsegmental atelectasis. Thin walled cystic lesion within the right upper lobe likely benign etiology. No focal consolidation. No pleural effusion. No pneumothorax. Musculoskeletal: No chest wall abnormality.  Left mastectomy. No suspicious lytic or blastic osseous lesions. No acute displaced fracture. Review of the MIP images confirms the above findings. CT ABDOMEN and PELVIS FINDINGS Hepatobiliary: No focal liver abnormality. Status post cholecystectomy. No biliary dilatation. Pancreas: No focal lesion. Normal pancreatic contour.  No surrounding inflammatory changes. No main pancreatic ductal dilatation. Spleen: Status post splenectomy. Adrenals/Urinary Tract: Stable 2.2 cm right adrenal gland nodule with a density of 63 Hounsfield units. Interval development of a nother right adrenal gland nodule with a density of 27 Hounsfield units and size of approximately 1.4 cm. Status post left adrenalectomy. Bilateral kidneys enhance symmetrically. Fluid density lesion within the right kidney likely represents a simple renal cyst. Subcentimeter hypodensities are too small to characterize. No hydronephrosis. No hydroureter. The urinary bladder is unremarkable. On delayed imaging, there is no urothelial wall thickening and there are no filling  defects in the opacified portions of the bilateral collecting systems or ureters. Stomach/Bowel: Stomach is within normal limits. No evidence of bowel wall thickening or dilatation. Status post appendectomy Vascular/Lymphatic: No abdominal aorta or iliac aneurysm. Trace atherosclerotic plaque of the aorta and its branches. No abdominal, pelvic, or inguinal lymphadenopathy. Reproductive: Status post hysterectomy. No adnexal masses. Other: No intraperitoneal free fluid. No intraperitoneal free gas. No organized fluid collection. Musculoskeletal: No abdominal wall hernia or abnormality. Healed anterior abdominal incisions. No suspicious lytic or blastic osseous lesions. No acute displaced fracture. Multilevel degenerative changes of the spine. Anterior and interbody L4-L5 and L5-S1 fusion with surgical hardware. Review of the MIP images confirms the above findings. IMPRESSION: 1. No pulmonary embolus. 2. Several prominent bilateral hilar lymph nodes with no definite lymphadenopathy. Recommend attention on follow-up. Otherwise no acute intra-abdominal abnormality. 3. Interval development of a 1.4 cm right adrenal gland nodule. Stable other 2.2 cm right adrenal gland nodule. Patient status post left adrenalectomy. Recommend CT adrenal protocol for further evaluation. 4. Status post left thyroidectomy, left mastectomy, left axillary lymph node dissection, left adrenalectomy, cholecystectomy, splenectomy, appendectomy, hysterectomy. 5.  Aortic Atherosclerosis (ICD10-I70.0) - trace. Electronically Signed   By: Iven Finn M.D.   On: 01/16/2021 21:07   MR Lumbar Spine W Wo Contrast  Result Date: 01/21/2021 CLINICAL DATA:  Low back pain.  Previous fusion. EXAM: MRI LUMBAR SPINE WITHOUT AND WITH CONTRAST TECHNIQUE: Multiplanar and multiecho pulse sequences of the lumbar spine were obtained without and with intravenous contrast. CONTRAST:  37mL GADAVIST GADOBUTROL 1 MMOL/ML IV SOLN COMPARISON:  Radiography 12/18/2020.   MRI 05/24/2020. FINDINGS: Segmentation:  5 lumbar type vertebral bodies. Alignment:  Normal Vertebrae: No primary bone process. Previous discectomy and fusion procedure L4-5 and L5-S1. Conus medullaris and cauda equina: Conus extends to the L1 level. Conus and cauda equina appear normal. Paraspinal and other soft tissues: Negative Disc levels: No abnormality at L2-3 or above. L3-4: Minimal desiccation and bulging of the disc. No stenosis or neural compression. L4-5: Previous anterior discectomy and fusion procedure. Sufficient patency of the canal and foramina. L5-S1: Previous anterior discectomy and fusion procedure. Sufficient patency of the canal and foramina. IMPRESSION: 1. Good appearance at the anterior discectomy and fusion levels of L4-5 and L5-S1. 2. L3-4 minor disc bulge. No stenosis or neural compression. Electronically Signed   By: Nelson Chimes M.D.   On: 01/21/2021 16:34   CT ABDOMEN PELVIS W CONTRAST  Result Date: 01/16/2021 CLINICAL DATA:  Nonlocalized acute abdominal pain., positive D-dimer with nausea, vomiting, diarrhea and dehydration. Surgery spine 12/03/2020. EXAM: CT ANGIOGRAPHY CHEST CT ABDOMEN AND PELVIS WITH CONTRAST TECHNIQUE: Multidetector CT imaging of the chest was performed using the standard protocol during bolus administration of intravenous contrast. Multiplanar CT image reconstructions and MIPs were obtained to evaluate the vascular anatomy. Multidetector CT imaging of the abdomen and pelvis was performed using the standard protocol during  bolus administration of intravenous contrast. CONTRAST:  170mL OMNIPAQUE IOHEXOL 350 MG/ML SOLN COMPARISON:  CT lumbar spine 09/09/2020, CT chest 03/21/2020, CT abdomen pelvis 12/24/2013 FINDINGS: CTA CHEST FINDINGS Cardiovascular: Satisfactory opacification of the pulmonary arteries to the segmental level. No evidence of pulmonary embolism. The main pulmonary artery is normal in caliber. Normal heart size. No significant pericardial effusion.  The thoracic aorta is normal in caliber. Trace atherosclerotic plaque of the thoracic aorta. No coronary artery calcifications. Mediastinum/Nodes: Several prominent but nonenlarged bilateral hilar lymph nodes. Status post left axillary lymph node dissection. No enlarged mediastinal, hilar, or axillary lymph nodes. Status post left thyroidectomy. Otherwise the thyroid gland, trachea, and esophagus demonstrate no significant findings. Lungs/Pleura: No pulmonary nodule. No pulmonary mass. Bilateral lower lobe subsegmental atelectasis. Thin walled cystic lesion within the right upper lobe likely benign etiology. No focal consolidation. No pleural effusion. No pneumothorax. Musculoskeletal: No chest wall abnormality.  Left mastectomy. No suspicious lytic or blastic osseous lesions. No acute displaced fracture. Review of the MIP images confirms the above findings. CT ABDOMEN and PELVIS FINDINGS Hepatobiliary: No focal liver abnormality. Status post cholecystectomy. No biliary dilatation. Pancreas: No focal lesion. Normal pancreatic contour. No surrounding inflammatory changes. No main pancreatic ductal dilatation. Spleen: Status post splenectomy. Adrenals/Urinary Tract: Stable 2.2 cm right adrenal gland nodule with a density of 63 Hounsfield units. Interval development of a nother right adrenal gland nodule with a density of 27 Hounsfield units and size of approximately 1.4 cm. Status post left adrenalectomy. Bilateral kidneys enhance symmetrically. Fluid density lesion within the right kidney likely represents a simple renal cyst. Subcentimeter hypodensities are too small to characterize. No hydronephrosis. No hydroureter. The urinary bladder is unremarkable. On delayed imaging, there is no urothelial wall thickening and there are no filling defects in the opacified portions of the bilateral collecting systems or ureters. Stomach/Bowel: Stomach is within normal limits. No evidence of bowel wall thickening or dilatation.  Status post appendectomy Vascular/Lymphatic: No abdominal aorta or iliac aneurysm. Trace atherosclerotic plaque of the aorta and its branches. No abdominal, pelvic, or inguinal lymphadenopathy. Reproductive: Status post hysterectomy. No adnexal masses. Other: No intraperitoneal free fluid. No intraperitoneal free gas. No organized fluid collection. Musculoskeletal: No abdominal wall hernia or abnormality. Healed anterior abdominal incisions. No suspicious lytic or blastic osseous lesions. No acute displaced fracture. Multilevel degenerative changes of the spine. Anterior and interbody L4-L5 and L5-S1 fusion with surgical hardware. Review of the MIP images confirms the above findings. IMPRESSION: 1. No pulmonary embolus. 2. Several prominent bilateral hilar lymph nodes with no definite lymphadenopathy. Recommend attention on follow-up. Otherwise no acute intra-abdominal abnormality. 3. Interval development of a 1.4 cm right adrenal gland nodule. Stable other 2.2 cm right adrenal gland nodule. Patient status post left adrenalectomy. Recommend CT adrenal protocol for further evaluation. 4. Status post left thyroidectomy, left mastectomy, left axillary lymph node dissection, left adrenalectomy, cholecystectomy, splenectomy, appendectomy, hysterectomy. 5.  Aortic Atherosclerosis (ICD10-I70.0) - trace. Electronically Signed   By: Iven Finn M.D.   On: 01/16/2021 21:07   US Carotid Bilateral  Result Date: 01/17/2021 CLINICAL DATA:  60 year old with syncope. EXAM: BILATERAL CAROTID DUPLEX ULTRASOUND TECHNIQUE: Pearline Cables scale imaging, color Doppler and duplex ultrasound were performed of bilateral carotid and vertebral arteries in the neck. COMPARISON:  03/26/2019 FINDINGS: Criteria: Quantification of carotid stenosis is based on velocity parameters that correlate the residual internal carotid diameter with NASCET-based stenosis levels, using the diameter of the distal internal carotid lumen as the denominator for  stenosis measurement. The  following velocity measurements were obtained: RIGHT ICA: 85/19 cm/sec CCA: [id number] cm/sec SYSTOLIC ICA/CCA RATIO:  0.9 ECA: 95 cm/sec LEFT ICA: 58/18 cm/sec CCA: [id number] cm/sec SYSTOLIC ICA/CCA RATIO:  0.6 ECA: 75 cm/sec RIGHT CAROTID ARTERY: Right carotid arteries are patent without significant plaque or stenosis. Normal waveforms and velocities in the internal carotid artery. External carotid artery is patent with normal waveform. RIGHT VERTEBRAL ARTERY: Antegrade flow and normal waveform in the right vertebral artery. LEFT CAROTID ARTERY: Small amount of echogenic plaque at the left carotid bulb. Small amount of heterogeneous plaque near the proximal external carotid artery. External carotid artery is patent with normal waveform. Normal waveforms and velocities in the internal carotid artery. LEFT VERTEBRAL ARTERY: Antegrade flow and normal waveform in the left vertebral artery. IMPRESSION: 1. Small amount of carotid artery atherosclerosis. Estimated degree of stenosis in the internal carotid arteries is less than 50% bilaterally. 2. Patent vertebral arteries with antegrade flow. Electronically Signed   By: Markus Daft M.D.   On: 01/17/2021 11:20   ECHOCARDIOGRAM COMPLETE  Result Date: 01/17/2021    ECHOCARDIOGRAM REPORT   Patient Name:   Annette [name] Date of Exam: 01/17/2021 Medical Rec #:  [id number]         Height:       65.0 in Accession #:    [id number]        Weight:       201.0 lb Date of Birth:  [birth year]        BSA:          1.982 m Patient Age:    54 years          BP:           109/55 mmHg Patient Gender: F                 HR:           90 bpm. Exam Location:  [facility] Procedure: 2D Echo, Cardiac Doppler and Color Doppler Indications:    Syncope R55  History:        Patient has prior history of Echocardiogram examinations, most                 recent 06/18/2019. Risk Factors:Diabetes. Sleep apnea.                 Palpitations.                 Hypothyroidism.   Sonographer:    BW Referring Phys: [id number] ASIA B [place]  1. Left ventricular ejection fraction, by estimation, is 60 to 65%. The left ventricle has normal function. The left ventricle has no regional wall motion abnormalities. Left ventricular diastolic parameters were normal.  2. Right ventricular systolic function is normal. The right ventricular size is normal. Tricuspid regurgitation signal is inadequate for assessing PA pressure.  3. The mitral valve is normal in structure. Trivial mitral valve regurgitation. No evidence of mitral stenosis.  4. The aortic valve is normal in structure. Aortic valve regurgitation is not visualized. No aortic stenosis is present.  5. The inferior vena cava is normal in size with greater than 50% respiratory variability, suggesting right atrial pressure of 3 mmHg. FINDINGS  Left Ventricle: Left ventricular ejection fraction, by estimation, is 60 to 65%. The left ventricle has normal function. The left ventricle has no regional wall motion abnormalities. The left ventricular internal cavity size was normal in size. There is  no left ventricular hypertrophy. Left  ventricular diastolic parameters were normal. Right Ventricle: The right ventricular size is normal. No increase in right ventricular wall thickness. Right ventricular systolic function is normal. Tricuspid regurgitation signal is inadequate for assessing PA pressure. Left Atrium: Left atrial size was normal in size. Right Atrium: Right atrial size was normal in size. Pericardium: There is no evidence of pericardial effusion. Mitral Valve: The mitral valve is normal in structure. Trivial mitral valve regurgitation. No evidence of mitral valve stenosis. Tricuspid Valve: The tricuspid valve is normal in structure. Tricuspid valve regurgitation is trivial. No evidence of tricuspid stenosis. Aortic Valve: The aortic valve is normal in structure. Aortic valve regurgitation is not visualized. No aortic stenosis  is present. Pulmonic Valve: The pulmonic valve was normal in structure. Pulmonic valve regurgitation is not visualized. No evidence of pulmonic stenosis. Aorta: The aortic root is normal in size and structure. Venous: The inferior vena cava is normal in size with greater than 50% respiratory variability, suggesting right atrial pressure of 3 mmHg. IAS/Shunts: No atrial level shunt detected by color flow Doppler.  LEFT VENTRICLE PLAX 2D LVIDd:         3.90 cm  Diastology LVIDs:         2.30 cm  LV e' medial:    10.90 cm/s LV PW:         0.80 cm  LV E/e' medial:  9.1 LV IVS:        0.90 cm  LV e' lateral:   12.00 cm/s LVOT diam:     1.80 cm  LV E/e' lateral: 8.2 LV SV:         60 LV SV Index:   30 LVOT Area:     2.54 cm  RIGHT VENTRICLE RV S prime:     13.60 cm/s TAPSE (M-mode): 2.4 cm LEFT ATRIUM             Index       RIGHT ATRIUM           Index LA diam:        3.40 cm 1.72 cm/m  RA Area:     11.80 cm LA Vol (A2C):   38.8 ml 19.57 ml/m RA Volume:   27.30 ml  13.77 ml/m LA Vol (A4C):   36.2 ml 18.26 ml/m LA Biplane Vol: 37.5 ml 18.92 ml/m  AORTIC VALVE LVOT Vmax:   110.00 cm/s LVOT Vmean:  66.200 cm/s LVOT VTI:    0.234 m  AORTA Ao Root diam: 2.90 cm MITRAL VALVE MV Area (PHT): 4.77 cm    SHUNTS MV Decel Time: 159 msec    Systemic VTI:  0.23 m MV E velocity: 99.00 cm/s  Systemic Diam: 1.80 cm MV A velocity: 71.60 cm/s MV E/A ratio:  1.38 Cherlynn Kaiser MD Electronically signed by Cherlynn Kaiser MD Signature Date/Time: 01/17/2021/1:34:39 PM    Final      Discharge Exam: Vitals:   01/23/21 0800 01/23/21 1436  BP: (!) 84/44 103/62  Pulse:  93  Resp:  18  Temp:  97.7 F (36.5 C)  SpO2:  100%   Vitals:   01/22/21 2158 01/23/21 0250 01/23/21 0800 01/23/21 1436  BP: (!) 104/43 (!) 86/55 (!) 84/44 103/62  Pulse: 92 82  93  Resp: 18 16  18   Temp: 97.9 F (36.6 C) 98.3 F (36.8 C)  97.7 F (36.5 C)  TempSrc:  Oral  Oral  SpO2: 98% 94%  100%  Weight:      Height:  General: Pt is  alert, awake, not in acute distress Cardiovascular: RRR, S1/S2 +, no rubs, no gallops Respiratory: CTA bilaterally, no wheezing, no rhonchi Abdominal: Soft, NT, ND, bowel sounds + Extremities: no edema, no cyanosis    The results of significant diagnostics from this hospitalization (including imaging, microbiology, ancillary and laboratory) are listed below for reference.     Microbiology: Recent Results (from the past 240 hour(s))  C Difficile Quick Screen w PCR reflex     Status: None   Collection Time: 01/16/21 10:40 PM   Specimen: STOOL  Result Value Ref Range Status   C Diff antigen NEGATIVE NEGATIVE Final   C Diff toxin NEGATIVE NEGATIVE Final   C Diff interpretation No C. difficile detected.  Final    Comment: Performed at [facility], [address] [place], [place] [address]  Resp Panel by RT-PCR (Flu A&B, Covid) Nasopharyngeal Swab     Status: None   Collection Time: 01/17/21 12:50 AM   Specimen: Nasopharyngeal Swab; Nasopharyngeal(NP) swabs in vial transport medium  Result Value Ref Range Status   SARS Coronavirus 2 by RT PCR NEGATIVE NEGATIVE Final    Comment: (NOTE) SARS-CoV-2 target nucleic acids are NOT DETECTED.  The SARS-CoV-2 RNA is generally detectable in upper respiratory specimens during the acute phase of infection. The lowest concentration of SARS-CoV-2 viral copies this assay can detect is 138 copies/mL. A negative result does not preclude SARS-Cov-2 infection and should not be used as the sole basis for treatment or other patient management decisions. A negative result may occur with  improper specimen collection/handling, submission of specimen other than nasopharyngeal swab, presence of viral mutation(s) within the areas targeted by this assay, and inadequate number of viral copies(<138 copies/mL). A negative result must be combined with clinical observations, patient history, and epidemiological information. The expected result is Negative.  Fact  Sheet for Patients:  EntrepreneurPulse.com.au  Fact Sheet for Healthcare Providers:  IncredibleEmployment.be  This test is no t yet approved or cleared by the [place] FDA and  has been authorized for detection and/or diagnosis of SARS-CoV-2 by FDA under an Emergency Use Authorization (EUA). This EUA will remain  in effect (meaning this test can be used) for the duration of the COVID-19 declaration under Section 564(b)(1) of the Act, 21 U.S.C.section 360bbb-3(b)(1), unless the authorization is terminated  or revoked sooner.       Influenza A by PCR NEGATIVE NEGATIVE Final   Influenza B by PCR NEGATIVE NEGATIVE Final    Comment: (NOTE) The Xpert Xpress SARS-CoV-2/FLU/RSV plus assay is intended as an aid in the diagnosis of influenza from Nasopharyngeal swab specimens and should not be used as a sole basis for treatment. Nasal washings and aspirates are unacceptable for Xpert Xpress SARS-CoV-2/FLU/RSV testing.  Fact Sheet for Patients: EntrepreneurPulse.com.au  Fact Sheet for Healthcare Providers: IncredibleEmployment.be  This test is not yet approved or cleared by the [place] FDA and has been authorized for detection and/or diagnosis of SARS-CoV-2 by FDA under an Emergency Use Authorization (EUA). This EUA will remain in effect (meaning this test can be used) for the duration of the COVID-19 declaration under Section 564(b)(1) of the Act, 21 U.S.C. section 360bbb-3(b)(1), unless the authorization is terminated or revoked.  Performed at [facility], [address] [place], [place] [address]   Gastrointestinal Panel by PCR , Stool     Status: None   Collection Time: 01/19/21 10:22 AM   Specimen: Stool  Result Value Ref Range Status   Campylobacter  species NOT DETECTED NOT DETECTED Final   Plesimonas shigelloides NOT DETECTED NOT DETECTED Final   Salmonella species NOT DETECTED NOT DETECTED  Final   Yersinia enterocolitica NOT DETECTED NOT DETECTED Final   Vibrio species NOT DETECTED NOT DETECTED Final   Vibrio cholerae NOT DETECTED NOT DETECTED Final   Enteroaggregative E coli (EAEC) NOT DETECTED NOT DETECTED Final   Enteropathogenic E coli (EPEC) NOT DETECTED NOT DETECTED Final   Enterotoxigenic E coli (ETEC) NOT DETECTED NOT DETECTED Final   Shiga like toxin producing E coli (STEC) NOT DETECTED NOT DETECTED Final   Shigella/Enteroinvasive E coli (EIEC) NOT DETECTED NOT DETECTED Final   Cryptosporidium NOT DETECTED NOT DETECTED Final   Cyclospora cayetanensis NOT DETECTED NOT DETECTED Final   Entamoeba histolytica NOT DETECTED NOT DETECTED Final   Giardia lamblia NOT DETECTED NOT DETECTED Final   Adenovirus F40/41 NOT DETECTED NOT DETECTED Final   Astrovirus NOT DETECTED NOT DETECTED Final   Norovirus GI/GII NOT DETECTED NOT DETECTED Final   Rotavirus A NOT DETECTED NOT DETECTED Final   Sapovirus (I, II, IV, and V) NOT DETECTED NOT DETECTED Final    Comment: Performed at [facility], [address] [place], [place] [address]     Labs: BNP (last 3 results) No results for input(s): BNP in the last 8760 hours. Basic Metabolic Panel: Recent Labs  Lab 01/18/21 0525 01/20/21 0552 01/21/21 0554 01/22/21 0511 01/23/21 0534  NA 145 144 144 144 143  K 3.6 3.0* 3.4* 4.7 3.9  CL 114* 112* 112* 111 109  CO2 24 23 24 26 26   GLUCOSE 71 108* 117* 116* 127*  BUN 7* 9 8 11 12   CREATININE 0.81 0.76 0.76 0.93 0.78  CALCIUM 9.2 9.0 8.8* 9.0 9.0  MG 1.7 1.6* 1.7 1.8 1.8   Liver Function Tests: Recent Labs  Lab 01/16/21 1816 01/17/21 0510  AST 34 27  ALT 29 25  ALKPHOS 89 78  BILITOT 0.7 0.5  PROT 8.0 7.1  ALBUMIN 4.1 3.6   Recent Labs  Lab 01/16/21 1816  LIPASE 86*   No results for input(s): AMMONIA in the last 168 hours. CBC: Recent Labs  Lab 01/16/21 1816 01/17/21 0510 01/20/21 0552 01/23/21 0534  WBC 12.4* 12.7* 12.0* 10.1  NEUTROABS 7.0 5.8   --   --   HGB 16.0* 14.9 12.8 12.2  HCT 46.6* 43.8 39.4 37.4  MCV 96.9 98.9 101.5* 102.5*  PLT 421* 350 314 303   Cardiac Enzymes: No results for input(s): CKTOTAL, CKMB, CKMBINDEX, TROPONINI in the last 168 hours. BNP: Invalid input(s): POCBNP CBG: Recent Labs  Lab 01/22/21 1423 01/22/21 1627 01/22/21 2154 01/23/21 0740 01/23/21 1108  GLUCAP 101* 81 111* 97 153*   D-Dimer No results for input(s): DDIMER in the last 72 hours. Hgb A1c No results for input(s): HGBA1C in the last 72 hours. Lipid Profile No results for input(s): CHOL, HDL, LDLCALC, TRIG, CHOLHDL, LDLDIRECT in the last 72 hours. Thyroid function studies No results for input(s): TSH, T4TOTAL, T3FREE, THYROIDAB in the last 72 hours.  Invalid input(s): FREET3 Anemia work up No results for input(s): VITAMINB12, FOLATE, FERRITIN, TIBC, IRON, RETICCTPCT in the last 72 hours. Urinalysis    Component Value Date/Time   COLORURINE STRAW (A) 01/16/2021 2240   APPEARANCEUR CLEAR 01/16/2021 2240   LABSPEC 1.042 (H) 01/16/2021 2240   PHURINE 7.0 01/16/2021 2240   GLUCOSEU NEGATIVE 01/16/2021 2240   HGBUR SMALL (A) 01/16/2021 2240   BILIRUBINUR NEGATIVE 01/16/2021 2240   BILIRUBINUR  neg 01/20/2012 1128   KETONESUR NEGATIVE 01/16/2021 2240   PROTEINUR NEGATIVE 01/16/2021 2240   UROBILINOGEN 0.2 12/23/2013 2210   NITRITE NEGATIVE 01/16/2021 2240   LEUKOCYTESUR NEGATIVE 01/16/2021 2240   Sepsis Labs Invalid input(s): PROCALCITONIN,  WBC,  LACTICIDVEN Microbiology Recent Results (from the past 240 hour(s))  C Difficile Quick Screen w PCR reflex     Status: None   Collection Time: 01/16/21 10:40 PM   Specimen: STOOL  Result Value Ref Range Status   C Diff antigen NEGATIVE NEGATIVE Final   C Diff toxin NEGATIVE NEGATIVE Final   C Diff interpretation No C. difficile detected.  Final    Comment: Performed at [facility], [address], [place], [place] [address]  Resp Panel by RT-PCR (Flu A&B, Covid) Nasopharyngeal  Swab     Status: None   Collection Time: 01/17/21 12:50 AM   Specimen: Nasopharyngeal Swab; Nasopharyngeal(NP) swabs in vial transport medium  Result Value Ref Range Status   SARS Coronavirus 2 by RT PCR NEGATIVE NEGATIVE Final    Comment: (NOTE) SARS-CoV-2 target nucleic acids are NOT DETECTED.  The SARS-CoV-2 RNA is generally detectable in upper respiratory specimens during the acute phase of infection. The lowest concentration of SARS-CoV-2 viral copies this assay can detect is 138 copies/mL. A negative result does not preclude SARS-Cov-2 infection and should not be used as the sole basis for treatment or other patient management decisions. A negative result may occur with  improper specimen collection/handling, submission of specimen other than nasopharyngeal swab, presence of viral mutation(s) within the areas targeted by this assay, and inadequate number of viral copies(<138 copies/mL). A negative result must be combined with clinical observations, patient history, and epidemiological information. The expected result is Negative.  Fact Sheet for Patients:  EntrepreneurPulse.com.au  Fact Sheet for Healthcare Providers:  IncredibleEmployment.be  This test is no t yet approved or cleared by the [place] FDA and  has been authorized for detection and/or diagnosis of SARS-CoV-2 by FDA under an Emergency Use Authorization (EUA). This EUA will remain  in effect (meaning this test can be used) for the duration of the COVID-19 declaration under Section 564(b)(1) of the Act, 21 U.S.C.section 360bbb-3(b)(1), unless the authorization is terminated  or revoked sooner.       Influenza A by PCR NEGATIVE NEGATIVE Final   Influenza B by PCR NEGATIVE NEGATIVE Final    Comment: (NOTE) The Xpert Xpress SARS-CoV-2/FLU/RSV plus assay is intended as an aid in the diagnosis of influenza from Nasopharyngeal swab specimens and should not be used as a sole  basis for treatment. Nasal washings and aspirates are unacceptable for Xpert Xpress SARS-CoV-2/FLU/RSV testing.  Fact Sheet for Patients: EntrepreneurPulse.com.au  Fact Sheet for Healthcare Providers: IncredibleEmployment.be  This test is not yet approved or cleared by the [place] FDA and has been authorized for detection and/or diagnosis of SARS-CoV-2 by FDA under an Emergency Use Authorization (EUA). This EUA will remain in effect (meaning this test can be used) for the duration of the COVID-19 declaration under Section 564(b)(1) of the Act, 21 U.S.C. section 360bbb-3(b)(1), unless the authorization is terminated or revoked.  Performed at [facility], [address] [place],  [address]   Gastrointestinal Panel by PCR , Stool     Status: None   Collection Time: 01/19/21 10:22 AM   Specimen: Stool  Result Value Ref Range Status   Campylobacter species NOT DETECTED NOT DETECTED Final   Plesimonas shigelloides NOT DETECTED NOT DETECTED Final   Salmonella  species NOT DETECTED NOT DETECTED Final   Yersinia enterocolitica NOT DETECTED NOT DETECTED Final   Vibrio species NOT DETECTED NOT DETECTED Final   Vibrio cholerae NOT DETECTED NOT DETECTED Final   Enteroaggregative E coli (EAEC) NOT DETECTED NOT DETECTED Final   Enteropathogenic E coli (EPEC) NOT DETECTED NOT DETECTED Final   Enterotoxigenic E coli (ETEC) NOT DETECTED NOT DETECTED Final   Shiga like toxin producing E coli (STEC) NOT DETECTED NOT DETECTED Final   Shigella/Enteroinvasive E coli (EIEC) NOT DETECTED NOT DETECTED Final   Cryptosporidium NOT DETECTED NOT DETECTED Final   Cyclospora cayetanensis NOT DETECTED NOT DETECTED Final   Entamoeba histolytica NOT DETECTED NOT DETECTED Final   Giardia lamblia NOT DETECTED NOT DETECTED Final   Adenovirus F40/41 NOT DETECTED NOT DETECTED Final   Astrovirus NOT DETECTED NOT DETECTED Final   Norovirus GI/GII NOT DETECTED NOT  DETECTED Final   Rotavirus A NOT DETECTED NOT DETECTED Final   Sapovirus (I, II, IV, and V) NOT DETECTED NOT DETECTED Final    Comment: Performed at [facility], [address] [place], [place] [address]     Time coordinating discharge: 35 minutes  SIGNED:   Rodena Goldmann, DO [facility] 01/23/2021, 4:19 PM  If 7PM-7AM, please contact night-coverage [email or web address]

## 2021-01-26 ENCOUNTER — Telehealth: Payer: Self-pay

## 2021-01-26 LAB — SURGICAL PATHOLOGY

## 2021-01-26 NOTE — Telephone Encounter (Signed)
TOC already scheduled.

## 2021-01-28 ENCOUNTER — Telehealth: Payer: Self-pay

## 2021-01-28 ENCOUNTER — Telehealth (INDEPENDENT_AMBULATORY_CARE_PROVIDER_SITE_OTHER): Payer: Self-pay | Admitting: Gastroenterology

## 2021-01-28 NOTE — Telephone Encounter (Signed)
Patient left voice mail message stating when she was in the hospital last week she was given something for dizziness - patient would like to know if Dr Jenetta Downer could send something to her pharmacy for dizziness - please advise - ph# [phone]

## 2021-01-28 NOTE — Telephone Encounter (Signed)
Pt says she has an appt next week and will discuss then.

## 2021-01-28 NOTE — Telephone Encounter (Signed)
Patient calling requesting something for dizziness ph# [phone]

## 2021-01-28 NOTE — Telephone Encounter (Signed)
Patient aware she will need to call her pcp Dr. Ricka Burdock dizziness medication.

## 2021-02-03 ENCOUNTER — Telehealth: Payer: Self-pay | Admitting: Internal Medicine

## 2021-02-03 NOTE — Telephone Encounter (Signed)
Annette [name] From  [employer] is calling regarding Needing a PA for  insulin degludec (TRESIBA FLEXTOUCH) 200 UNIT/ML FlexTouch Pen for patient.Insurance needs it for patient.  Fax number was given to Judson Roch. States that she will be sending a fax over.

## 2021-02-05 ENCOUNTER — Encounter: Payer: Self-pay | Admitting: Family Medicine

## 2021-02-05 ENCOUNTER — Ambulatory Visit: Payer: BC Managed Care – PPO | Admitting: Family Medicine

## 2021-02-05 ENCOUNTER — Other Ambulatory Visit: Payer: Self-pay

## 2021-02-05 VITALS — BP 104/46 | HR 90 | Temp 97.6°F | Ht 65.5 in | Wt 202.0 lb

## 2021-02-05 DIAGNOSIS — Z09 Encounter for follow-up examination after completed treatment for conditions other than malignant neoplasm: Secondary | ICD-10-CM | POA: Diagnosis not present

## 2021-02-05 DIAGNOSIS — R55 Syncope and collapse: Secondary | ICD-10-CM

## 2021-02-05 DIAGNOSIS — Z794 Long term (current) use of insulin: Secondary | ICD-10-CM

## 2021-02-05 DIAGNOSIS — E1169 Type 2 diabetes mellitus with other specified complication: Secondary | ICD-10-CM

## 2021-02-05 DIAGNOSIS — F17208 Nicotine dependence, unspecified, with other nicotine-induced disorders: Secondary | ICD-10-CM

## 2021-02-05 DIAGNOSIS — I959 Hypotension, unspecified: Secondary | ICD-10-CM

## 2021-02-05 DIAGNOSIS — M5442 Lumbago with sciatica, left side: Secondary | ICD-10-CM | POA: Diagnosis not present

## 2021-02-05 DIAGNOSIS — G8929 Other chronic pain: Secondary | ICD-10-CM

## 2021-02-05 MED ORDER — OXYCODONE-ACETAMINOPHEN 5-325 MG PO TABS: ORAL_TABLET | ORAL | 0 refills | Status: AC

## 2021-02-05 NOTE — Patient Instructions (Addendum)
Please change f/u from June to  mid July, cLL IF YOU NEED ME SOONER  CHEM 7 AND egfR AND mAGNESIUM TODAY  PLEASE WORK ON QUITTING SMOKING  LIMITED SUPPLEY OF PAIN MEDICATION FOR SHORT TERM USE ONLY, IF PERSISTS WLL NEED TO SPEAK WITH SURGEON. pLEASE LIMIT BIKE RIDING!  YOU ARE BEING REFERRED TO CARDIOLOGY  Thanks for choosing [facility], we consider it a privelige to serve you.

## 2021-02-06 DIAGNOSIS — Z09 Encounter for follow-up examination after completed treatment for conditions other than malignant neoplasm: Secondary | ICD-10-CM | POA: Insufficient documentation

## 2021-02-06 DIAGNOSIS — I959 Hypotension, unspecified: Secondary | ICD-10-CM | POA: Insufficient documentation

## 2021-02-06 LAB — BMP8+EGFR
BUN/Creatinine Ratio: 13 (ref 12–28)
BUN: 12 mg/dL (ref 8–27)
CO2: 24 mmol/L (ref 20–29)
Calcium: 10.3 mg/dL (ref 8.7–10.3)
Chloride: 110 mmol/L — ABNORMAL HIGH (ref 96–106)
Creatinine, Ser: 0.9 mg/dL (ref 0.57–1.00)
Glucose: 86 mg/dL (ref 65–99)
Potassium: 5 mmol/L (ref 3.5–5.2)
Sodium: 146 mmol/L — ABNORMAL HIGH (ref 134–144)
eGFR: 73 mL/min/{1.73_m2} (ref >59)

## 2021-02-06 LAB — MAGNESIUM: Magnesium: 2.1 mg/dL (ref 1.6–2.3)

## 2021-02-06 NOTE — Assessment & Plan Note (Signed)
Limited supply of pain medication prescribed

## 2021-02-06 NOTE — Assessment & Plan Note (Signed)
Asked:confirms currently smokes cigarettes °Assess: Unwilling to set a quit date, but is cutting back °Advise: needs to QUIT to reduce risk of cancer, cardio and cerebrovascular disease °Assist: counseled for 5 minutes and literature provided °Arrange: follow up in 2 to 4 months ° °

## 2021-02-06 NOTE — Assessment & Plan Note (Signed)
Recently discharged on midodrine due to  Consistently low blood pressure esp diastolic. Not taking as directed and BP is low in office refer to cardiology to follow up and manage this, not a new problem

## 2021-02-06 NOTE — Progress Notes (Signed)
   Annette [name]     MRN: [id number]      DOB: 1961/01/22   HPI Annette [name] is here for follow up of recent hospitalization at [facility] from 4/15 to 01/23/2021 main dx syncope  With hypokalemia  In the setting of acute on chronic diarrhea of unclear etiology Has been discharged on midorine to keep blood pressure up, she is not taking as directed and diastolic pressure is low Has gI follow up pending Went bike riding recent ly c/o increased bilateral groin pain, requests limited amt of pain medication, has been fol.lowing with Neurosurgery post op ROS Denies recent fever or chills. Denies sinus pressure, nasal congestion, ear pain or sore throat. Denies chest congestion, productive cough or wheezing. Denies chest pains, palpitations and leg swelling Denies abdominal pain, nausea, vomiting,diarrhea or constipation.   Denies dysuria, frequency, hesitancy or incontinence.  Denies headaches, seizures, numbness, or tingling. Denies uncontrolled  depression, anxiety or insomnia. Denies skin break down or rash.   PE  BP (!) 104/46 (BP Location: Right Arm, Patient Position: Sitting, Cuff Size: Large)   Pulse 90   Temp 97.6 F (36.4 C) (Temporal)   Ht 5' 5.5" (1.664 m)   Wt 202 lb (91.6 kg)   SpO2 97%   BMI 33.10 kg/m   Patient alert and oriented and in no cardiopulmonary distress.  HEENT: No facial asymmetry, EOMI,     Neck supple .  Chest: Clear to auscultation bilaterally.  CVS: S1, S2 no murmurs, no S3.Regular rate.  ABD: Soft non tender.   Ext: No edema  MS: decreased  ROM spine, Skin: Intact, no ulcerations or rash noted.  Psych: Good eye contact, normal affect. Memory intact not anxious or depressed appearing.  CNS: CN 2-12 intact, power,  normal throughout.no focal deficits noted.   [facility] discharge follow-up Patient in for follow up of recent hospitalization. Discharge summary, and laboratory and radiology data are reviewed, and any questions  or concerns  are discussed. Specific issues requiring follow up are specifically addressed.   Hypotension Recently discharged on midodrine due to  Consistently low blood pressure esp diastolic. Not taking as directed and BP is low in office refer to cardiology to follow up and manage this, not a new problem  Back pain Limited supply of pain medication prescribed  Nicotine dependence Asked:confirms currently smokes cigarettes Assess: Unwilling to set a quit date, but is cutting back Advise: needs to QUIT to reduce risk of cancer, cardio and cerebrovascular disease Assist: counseled for 5 minutes and literature provided Arrange: follow up in 2 to 4 months

## 2021-02-06 NOTE — Telephone Encounter (Signed)
PA has been done and waiting on response

## 2021-02-06 NOTE — Assessment & Plan Note (Signed)
Patient in for follow up of recent hospitalization. Discharge summary, and laboratory and radiology data are reviewed, and any questions or concerns  are discussed. Specific issues requiring follow up are specifically addressed.  

## 2021-02-09 ENCOUNTER — Telehealth: Payer: Self-pay | Admitting: Internal Medicine

## 2021-02-09 NOTE — Telephone Encounter (Signed)
MEDICATION: One Touch Delica Plus  PHARMACY:  [facility] [address] in [place]?  Yes, pharmacy stated request was denied  IS THIS A 90 DAY SUPPLY : 1 box of 100  IS PATIENT OUT OF MEDICATION: no  IF NOT; HOW MUCH IS LEFT:   LAST APPOINTMENT DATE: @5 /12/2020  NEXT APPOINTMENT DATE:@8 /16/2022  DO WE HAVE YOUR PERMISSION TO LEAVE A DETAILED MESSAGE?: [phone] (OK to leave message)  OTHER COMMENTS:    **Let patient know to contact pharmacy at the end of the day to make sure medication is ready. **  ** Please notify patient to allow 48-72 hours to process**  **Encourage patient to contact the pharmacy for refills or they can request refills through [facility]**

## 2021-02-10 ENCOUNTER — Other Ambulatory Visit: Payer: Self-pay | Admitting: *Deleted

## 2021-02-10 ENCOUNTER — Encounter: Payer: Self-pay | Admitting: Family Medicine

## 2021-02-11 ENCOUNTER — Other Ambulatory Visit: Payer: Self-pay | Admitting: *Deleted

## 2021-02-11 MED ORDER — ONETOUCH DELICA PLUS LANCET33G MISC: 3 refills | Status: DC

## 2021-02-11 NOTE — Telephone Encounter (Signed)
Rx sent 

## 2021-02-16 ENCOUNTER — Other Ambulatory Visit: Payer: Self-pay | Admitting: Family Medicine

## 2021-02-16 DIAGNOSIS — E785 Hyperlipidemia, unspecified: Secondary | ICD-10-CM

## 2021-02-19 ENCOUNTER — Telehealth: Payer: BC Managed Care – PPO | Admitting: Nurse Practitioner

## 2021-02-26 ENCOUNTER — Other Ambulatory Visit: Payer: Self-pay

## 2021-02-26 ENCOUNTER — Telehealth (INDEPENDENT_AMBULATORY_CARE_PROVIDER_SITE_OTHER): Payer: BC Managed Care – PPO | Admitting: Gastroenterology

## 2021-02-26 ENCOUNTER — Encounter (INDEPENDENT_AMBULATORY_CARE_PROVIDER_SITE_OTHER): Payer: Self-pay | Admitting: Gastroenterology

## 2021-02-26 VITALS — Ht 65.5 in | Wt 202.0 lb

## 2021-02-26 DIAGNOSIS — K580 Irritable bowel syndrome with diarrhea: Secondary | ICD-10-CM | POA: Diagnosis not present

## 2021-02-26 MED ORDER — RIFAXIMIN 550 MG PO TABS: 550.0000 mg | ORAL_TABLET | Freq: Three times a day (TID) | ORAL | 0 refills | Status: AC

## 2021-02-26 NOTE — Patient Instructions (Signed)
Start Xifaxan 550 mg TID for 2 weeks If no improvement with Xifaxan will consider a trial of Elavil + Lomotil as needed

## 2021-02-26 NOTE — Progress Notes (Signed)
[name], M.D. Gastroenterology & Hepatology [facility] [address] [place], [place] [address] Primary Care Physician: Fayrene Helper, MD [address] [place] [address]  This is a telephone virtual visit.  It required patient-provider interaction for the medical decision making as documented below. The patient has consented and agreed to proceed with a Telehealth encounter given the current Coronavirus pandemic.  VIRTUAL VISIT NOTE Patient location: Home Provider location: Home  I will communicate my assessment and recommendations to the referring MD via EMR.  Problems: 1. 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  IBS-D  History of Present Illness: Annette [name] is a 60 y.o. female  with past medical history of depression, diabetes, GERD, hyperlipidemia, hypothyroidism, IBS -D, obesity, OSA, history of breast cancer on remission, who presents for follow-up of chronic diarrhea and recent hospitalization due to this.  The patient was admitted to the hospital on 01/16/2021 after presenting severe diarrhea since early 2022.  This led to significant dehydration that required hospitalization.  The patient actually presented an episode of syncope.  She had negative C. difficile and GI pathogen panel.  Due to the severity of her diarrhea she underwent a flexible sigmoidoscopy where she was found to have a normal examination with presence of formed stool.  Also had other testing that included celiac serology (TTG IgA) which was negative.  The patient had improvement of her bowel movements after this stay and was subsequently discharged home on Imodium.  Notably, the patient presented exacerbation of all of her symptoms after undergoing back surgery.  She states that she did well last year but her symptoms worsen specifically after her surgery.  Patient reports she has fluctuation in her bowel movements between  2-6 BMs per day. BMs are between loose and watery in consistency. Has tried Equate shakes which cause the stool to be very watery but every time she eats any food she has loose bowel movements.  She denies having any nausea and abdominal pain. She has significant abdominal pain before she has a BM, it gets better when she defecates. No clearly identified food that triggers her symptoms. Does not follow any diet but has noticed that if she eats salads and greasy foods, her symptoms get worse.  She has never received Xifaxan.  She states that Lomotil has worked better than Imodium.  The patient denies having any nausea, vomiting, fever, chills, hematochezia, melena, hematemesis, abdominal distention, jaundice, pruritus or weight loss.  Had cholecystectomy in 1995.  Last Colonoscopy: 2021 - One 15 mm polyp in the cecum, removed with a cold snare and removed with a hot snare. Resected and retrieved. Injected.  This was sessile serrated polyp. - Two 10 to 12 mm polyps in the rectum and in the descending colon, removed with a cold snare. Resected and retrieved.  Tubular adenomas. - The rest entire examined colon is normal. Biopsied, negative for microscopic colitis - The distal rectum and anal verge are normal on retroflexion view.  Past Medical History: Past Medical History:  Diagnosis Date  . Arthritis    Phreesia 10/14/2020  . Blood transfusion without reported diagnosis    Phreesia 10/14/2020  . Breast cancer ([place]) 2007   History of  XRT, onTamoxifen  . Bruises easily   . Cancer ([place])    Phreesia 10/14/2020  . Depression   . Depression    Phreesia 10/14/2020  . Diabetes mellitus    prediabetic  . Diabetes mellitus without complication (  [place])    Phreesia 10/14/2020  . GERD (gastroesophageal reflux disease)   . Hypertriglyceridemia   . Hypothyroidism    following chemo amnd radiaition for breast cancer, needed replacement short term  . Hypothyroidism (acquired)    replaced x 1 year   . IBS (irritable bowel syndrome)   . Menieres disease    Controlled with triamterene   . Migraines   . Obesity   . Palpitations   . 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[place])   . Sleep apnea 2010   Problems with CPAP    Past Surgical History: Past Surgical History:  Procedure Laterality Date  . ABDOMINAL EXPOSURE N/A 12/03/2020   Procedure: ABDOMINAL EXPOSURE;  Surgeon: Rosetta Posner, MD;  Location: [facility] OR;  Service: Vascular;  Laterality: N/A;  . ABDOMINAL HYSTERECTOMY  1998   Benign, fibroids  . 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 ANTERIOR LUMBAR FUSION N/A 12/03/2020   Procedure: Lumbar Four-Five Lumbar Five Sacral One Anterior lumbar interbody fusion;  Surgeon: Ashok Pall, MD;  Location: [facility];  Service: Neurosurgery;  Laterality: N/A;  . APPENDECTOMY  06/12/08  . BIOPSY  07/02/2020   Procedure: BIOPSY;  Surgeon: Harvel Quale, MD;  [facility];  Service: Gastroenterology;;  . BIOPSY  01/22/2021   Procedure: BIOPSY;  Surgeon: Daneil Dolin, MD;  [facility];  Service: Endoscopy;;  . BREAST LUMPECTOMY Left 2008  . BREAST RECONSTRUCTION     Left reconstructive  . BREAST SURGERY  2007   Left lumpectomy  . BREAST SURGERY     Mammosite - right side  . CHOLECYSTECTOMY  1985  . COLONOSCOPY WITH PROPOFOL N/A 07/02/2020   Procedure: COLONOSCOPY WITH PROPOFOL;  Surgeon: Harvel Quale, MD;  [facility];  Service: Gastroenterology;  Laterality: N/A;  1045  . ESOPHAGEAL DILATION  12/19/2015   Procedure: ESOPHAGEAL DILATION;  Surgeon: Rogene Houston, MD;  [facility];  Service: Endoscopy;;  . ESOPHAGOGASTRODUODENOSCOPY N/A 12/19/2015   Procedure: ESOPHAGOGASTRODUODENOSCOPY (EGD);  Surgeon: Rogene Houston, MD;  [facility];  Service: Endoscopy;  Laterality: N/A;  11:40  . FLEXIBLE SIGMOIDOSCOPY N/A 01/22/2021   Procedure: FLEXIBLE SIGMOIDOSCOPY;  Surgeon: Daneil Dolin,  MD;  [facility];  Service: Endoscopy;  Laterality: N/A;  . POLYPECTOMY  07/02/2020   Procedure: POLYPECTOMY;  Surgeon: Harvel Quale, MD;  [facility];  Service: Gastroenterology;;  . 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 THYROIDECTOMY, PARTIAL  11/05/2010   Benign disease    Family History: Family History  Problem Relation Age of Onset  . Diabetes Mother   . Arrhythmia Mother   . Heart disease Father   . Hyperlipidemia Father   . Arrhythmia Father   . Cancer Paternal Grandmother        Breast  . Diabetes Sister   . Diabetes Brother   . Heart disease Brother     Social History: Social History   Tobacco Use  Smoking Status Current Every Day Smoker  . Packs/day: 0.50  . Years: 40.00  . Pack years: 20.00  . Types: Cigarettes  Smokeless Tobacco Never Used  Tobacco Comment   1/2 pack a day   Social History   Substance and Sexual Activity  Alcohol Use No  . Alcohol/week: 0.0 standard drinks   Comment: Encouraged to quit smoking. She has tried the nicotone gum and patches but  did not help   Social History   Substance and Sexual Activity  Drug Use No    Allergies: Allergies  Allergen Reactions  . Penicillins Swelling    Has patient had a PCN reaction causing immediate rash, facial/tongue/throat swelling, SOB or lightheadedness with hypotension: Yes Has patient had a PCN reaction causing severe rash involving mucus membranes or skin necrosis: No Has patient had a PCN reaction that required hospitalization: No  Has patient had a PCN reaction occurring within the last 10 years: No If all of the above answers are "NO", then may proceed with Cephalosporin use.   Of face and tongue.  . Metformin And Related Diarrhea and Nausea Only    Pt to discontinue med due to intolerance, cramps and diarrhea  . Wound Dressing Adhesive Itching and Rash    Dermabond and other surgical glues for  incisions  . Empagliflozin Other (See Comments)    Yeast infection  Jardiance  . Penicillin G Other (See Comments)  . Statins Other (See Comments)    Fell bad but on Crestor  . Zetia [Ezetimibe] Other (See Comments)    Feels funny    Medications: Current Outpatient Medications  Medication Sig Dispense Refill  . aspirin 81 MG EC tablet Take 81 mg by mouth daily.    Marland Kitchen CLOTRIMAZOLE EX Apply 1 application topically daily as needed (itching an breast). 15 %    . cyclobenzaprine (FLEXERIL) 10 MG tablet Take 1 tablet (10 mg total) by mouth 3 (three) times daily as needed for muscle spasms. 30 tablet 0  . Dulaglutide (TRULICITY) 1.5 PJ/8.2NK SOPN Inject 0.5 mLs (1.5 mg total) into the skin once a week. 4 pen 11  . ergocalciferol (VITAMIN D2) 1.25 MG (50000 UT) capsule Take 50,000 Units by mouth once a week.    Marland Kitchen glucose blood test strip See admin instructions.    . insulin degludec (TRESIBA FLEXTOUCH) 200 UNIT/ML FlexTouch Pen Inject 50 Units into the skin at bedtime. (Patient taking differently: Inject 46-48 Units into the skin at bedtime.) 18 mL 1  . Insulin Pen Needle (PEN NEEDLES) 31G X 6 MM MISC See admin instructions.    . Lancets (ONETOUCH DELICA PLUS [id number]) MISC Use 4 lancets per day to check blood sugar 200 each 3  . LORazepam (ATIVAN) 1 MG tablet Take 1 mg by mouth daily as needed (Nausea).    . Melatonin 10 MG TABS Take 1 tablet by mouth daily.    . naproxen (NAPROSYN) 500 MG tablet TAKE 1 TABLET(500 MG) BY MOUTH TWICE DAILY WITH A MEAL (Patient taking differently: Take 500 mg by mouth 2 (two) times daily with a meal.) 40 tablet 0  . omeprazole (PRILOSEC) 40 MG capsule Take 1 capsule (40 mg total) by mouth daily. 30 capsule 5  . ONETOUCH VERIO test strip USE AS DIRECTED FOUR TIMES DAILY 150 strip 5  . oxyCODONE-acetaminophen (PERCOCET) 5-325 MG tablet Take one tablet by mouth two tmes daily , as needed, for uncontrolled leg pain 10 tablet 0  . rosuvastatin (CRESTOR) 20 MG tablet  Take 1 tablet (20 mg total) by mouth daily. 90 tablet 1  . temazepam (RESTORIL) 15 MG capsule Take 1 capsule (15 mg total) by mouth at bedtime. 30 capsule 5  . traZODone (DESYREL) 100 MG tablet Take 1 tablet (100 mg total) by mouth at bedtime. 90 tablet 1  . venlafaxine XR (EFFEXOR-XR) 75 MG 24 hr capsule TAKE 1 CAPSULE(75 MG) BY MOUTH DAILY (Patient taking differently: Take 75 mg  by mouth daily with breakfast.) 90 capsule 0  . gabapentin (NEURONTIN) 100 MG capsule Take 2 capsules (200 mg total) by mouth 3 (three) times daily. In three days, increase to 300 mg TID (Patient not taking: Reported on 02/26/2021) 90 capsule 3   No current facility-administered medications for this visit.    Review of Systems: GENERAL: negative for malaise, night sweats HEENT: No changes in hearing or vision, no nose bleeds or other nasal problems. NECK: Negative for lumps, goiter, pain and significant neck swelling RESPIRATORY: Negative for cough, wheezing CARDIOVASCULAR: Negative for chest pain, leg swelling, palpitations, orthopnea GI: SEE HPI MUSCULOSKELETAL: Negative for joint pain or swelling, back pain, and muscle pain. SKIN: Negative for lesions, rash PSYCH: Negative for sleep disturbance, mood disorder and recent psychosocial stressors. HEMATOLOGY Negative for prolonged bleeding, bruising easily, and swollen nodes. ENDOCRINE: Negative for cold or heat intolerance, polyuria, polydipsia and goiter. NEURO: negative for tremor, gait imbalance, syncope and seizures. The remainder of the review of systems is noncontributory.   Physical Exam: No exam was performed as this was a telephone encounter  Imaging/Labs: as above  I personally reviewed and interpreted the available labs, imaging and endoscopic files.  Impression and Plan: Annette [name] is a 60 y.o. female  with past medical history of depression, diabetes, GERD, hyperlipidemia, hypothyroidism, IBS -D, obesity, OSA, history of breast cancer on  remission, who presents for follow-up of chronic diarrhea and recent hospitalization due to this.  The patient has presented significant episodes of diarrhea with negative investigations for celiac disease, gastrointestinal infections, malignancies or IBD/microscopic colitis.  It is possible that after her surgery she may have presented some worsening nerve damage that led to exacerbation of her underlying IBS-D.  I consider she would benefit from taking Xifaxan for 2 weeks for management of IBS-D.  If this fails to provide any improvement of the symptoms, she will benefit from a trial of combination of Elavil and Lomotil as needed.  The patient understood and agreed.  We will order a surveillance colonoscopy as she had a large colonic polyp removed in the past that was removed in a piecemeal fashion, is due for repeat colonoscopy 6 months after this procedure.  - Start Xifaxan 550 mg TID for 2 weeks - If no improvement with Xifaxan will consider a trial of Elavil + Lomotil as needed - Schedule Colonoscopy   All questions were answered.      Total visit time: I spent a total of  25 minutes  [name], MD Gastroenterology and Hepatology [facility]

## 2021-02-26 NOTE — Addendum Note (Signed)
Addended by: Harvel Quale on: 02/26/2021 08:36 PM   Modules accepted: Orders, SmartSet

## 2021-02-28 ENCOUNTER — Other Ambulatory Visit: Payer: Self-pay | Admitting: Family Medicine

## 2021-03-02 NOTE — Progress Notes (Signed)
Cardiology Clinic Note   Patient Name: Annette [name] Date of Encounter: 03/04/2021  Primary Care Provider:  Fayrene Helper, MD Primary Cardiologist:  Evalina Field, MD  Patient Profile    Annette [name] 60 year old female presents the clinic for follow-up evaluation of her essential hypertension and hyperlipidemia.  Past Medical History    Past Medical History:  Diagnosis Date  . Arthritis    Phreesia 10/14/2020  . Blood transfusion without reported diagnosis    Phreesia 10/14/2020  . Breast cancer ([place]) 2007   History of  XRT, onTamoxifen  . Bruises easily   . Cancer ([place])    Phreesia 10/14/2020  . Depression   . Depression    Phreesia 10/14/2020  . Diabetes mellitus    prediabetic  . Diabetes mellitus without complication ([place])    Phreesia 10/14/2020  . GERD (gastroesophageal reflux disease)   . Hypertriglyceridemia   . Hypothyroidism    following chemo amnd radiaition for breast cancer, needed replacement short term  . Hypothyroidism (acquired)    replaced x 1 year  . IBS (irritable bowel syndrome)   . Menieres disease    Controlled with triamterene   . Migraines   . Obesity   . Palpitations   . 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[place])   . Sleep apnea 2010   Problems with CPAP   Past Surgical History:  Procedure Laterality Date  . ABDOMINAL EXPOSURE N/A 12/03/2020   Procedure: ABDOMINAL EXPOSURE;  Surgeon: Rosetta Posner, MD;  [facility];  Service: Vascular;  Laterality: N/A;  . ABDOMINAL HYSTERECTOMY  1998   Benign, fibroids  . 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 ANTERIOR LUMBAR FUSION N/A 12/03/2020   Procedure: Lumbar Four-Five Lumbar Five Sacral One Anterior lumbar interbody fusion;  Surgeon: Ashok Pall, MD;  Location: [place];  Service: Neurosurgery;  Laterality: N/A;  . APPENDECTOMY  06/12/08  . BIOPSY  07/02/2020   Procedure: BIOPSY;  Surgeon: Harvel Quale, MD;  [facility];   Service: Gastroenterology;;  . BIOPSY  01/22/2021   Procedure: BIOPSY;  Surgeon: Daneil Dolin, MD;  [facility];  Service: Endoscopy;;  . BREAST LUMPECTOMY Left 2008  . BREAST RECONSTRUCTION     Left reconstructive  . BREAST SURGERY  2007   Left lumpectomy  . BREAST SURGERY     Mammosite - right side  . CHOLECYSTECTOMY  1985  . COLONOSCOPY WITH PROPOFOL N/A 07/02/2020   Procedure: COLONOSCOPY WITH PROPOFOL;  Surgeon: Harvel Quale, MD;  [facility];  Service: Gastroenterology;  Laterality: N/A;  1045  . ESOPHAGEAL DILATION  12/19/2015   Procedure: ESOPHAGEAL DILATION;  Surgeon: Rogene Houston, MD;  [facility];  Service: Endoscopy;;  . ESOPHAGOGASTRODUODENOSCOPY N/A 12/19/2015   Procedure: ESOPHAGOGASTRODUODENOSCOPY (EGD);  Surgeon: Rogene Houston, MD;  [facility];  Service: Endoscopy;  Laterality: N/A;  11:40  . FLEXIBLE SIGMOIDOSCOPY N/A 01/22/2021   Procedure: FLEXIBLE SIGMOIDOSCOPY;  Surgeon: Daneil Dolin, MD;  [facility];  Service: Endoscopy;  Laterality: N/A;  . POLYPECTOMY  07/02/2020   Procedure: POLYPECTOMY;  Surgeon: Harvel Quale, MD;  [facility];  Service: Gastroenterology;;  . 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 THYROIDECTOMY, PARTIAL  11/05/2010   Benign disease    Allergies  Allergies  Allergen Reactions  . Penicillins Swelling    Has patient  had a PCN reaction causing immediate rash, facial/tongue/throat swelling, SOB or lightheadedness with hypotension: Yes Has patient had a PCN reaction causing severe rash involving mucus membranes or skin necrosis: No Has patient had a PCN reaction that required hospitalization: No  Has patient had a PCN reaction occurring within the last 10 years: No If all of the above answers are "NO", then may proceed with Cephalosporin use.   Of face and tongue.  . Metformin And Related  Diarrhea and Nausea Only    Pt to discontinue med due to intolerance, cramps and diarrhea  . Wound Dressing Adhesive Itching and Rash    Dermabond and other surgical glues for incisions  . Empagliflozin Other (See Comments)    Yeast infection  Jardiance  . Penicillin G Other (See Comments)  . Statins Other (See Comments)    Fell bad but on Crestor  . Zetia [Ezetimibe] Other (See Comments)    Feels funny    History of Present Illness    SHAMS FILL has a PMH of diabetes, arthritis, hyperlipidemia, anxiety, bradycardia, essential hypertension, hypotension, IBS, Mnire's disease, type 2 diabetes, and GERD.  She was seen and evaluated September 2020 for bradycardia and chest pain.  An ambulatory ECG showed no significant bradycardia arrhythmias.  Her echocardiogram was normal.  She did not want to undergo an exercise stress test.  Cardiac CTA was ordered but she did not present for the study.  She reported she lost the date for the test due to being busy.  She was seen by Dr. Audie Box on 10/22/2019.  During that time she reported she continued to have 3-4 episodes of chest discomfort per week.  The episodes could occur while sitting that would last for hours.  She did not note any triggers prior to the events.  She denied alleviating factors.  She stated that the pain dissipated after minutes to hours.  Her echocardiogram was reviewed.  Her cardiac event monitor was also reviewed.  She continued to smoke half pack per day and had done so for 40 years.  Her blood pressure was normal at that time and her cholesterol panel was within defined limits.  She did report some associated shortness of breath with her chest discomfort.  She denied lower extremity edema and orthopnea.  She was admitted to the hospital on 01/17/2021 and discharged on 01/23/2021 with symptoms of syncope related to chronic diarrhea since March 2022.  She received IV fluids for several days and was given Imodium.  No signs of  infection were noted.  Her condition gradually resolved and she was discharged in stable condition.  She was instructed to follow-up with GI as an outpatient.  At that time she was taken off of her home antihypertension medications and instructed to follow-up with her PCP.  She presents to the clinic today for follow-up evaluation states she continues to have 3-4 episodes of diarrhea daily.  She reports that she has tried to increase her p.o. hydration.  She continues to have episodes where she is lightheaded and dizzy.  We reviewed her previous cardiac event monitor and recent echocardiogram she expresses understanding.  It does not appear that her symptoms are related to cardiac issues.  I reviewed this with her and her husband.  She has a colonoscopy scheduled for June.  I have encouraged her to maintain her p.o. hydration, wear lower extremity support stockings, increase her sodium intake, and will have her follow-up in 6 months with Dr. Audie Box.  Today she  denies chest pain, shortness of breath, lower extremity edema, fatigue, palpitations, melena, hematuria, hemoptysis, diaphoresis, weakness, presyncope, syncope, orthopnea, and PND.   Home Medications    Prior to Admission medications   Medication Sig Start Date End Date Taking? Authorizing Provider  aspirin 81 MG EC tablet Take 81 mg by mouth daily.    [provider]  CLOTRIMAZOLE EX Apply 1 application topically daily as needed (itching an breast). 15 %    [provider]  cyclobenzaprine (FLEXERIL) 10 MG tablet Take 1 tablet (10 mg total) by mouth 3 (three) times daily as needed for muscle spasms. 01/23/21   Manuella Ghazi, Pratik D, DO  Dulaglutide (TRULICITY) 1.5 JQ/3.0SP SOPN Inject 0.5 mLs (1.5 mg total) into the skin once a week. 04/17/20   Philemon Kingdom, MD  ergocalciferol (VITAMIN D2) 1.25 MG (50000 UT) capsule Take 50,000 Units by mouth once a week.    [provider]  gabapentin (NEURONTIN) 100 MG capsule Take 2  capsules (200 mg total) by mouth 3 (three) times daily. In three days, increase to 300 mg TID Patient not taking: Reported on 2/33/0076 11/30/31   Delora Fuel, MD  glucose blood test strip See admin instructions. 01/31/20   [provider]  insulin degludec (TRESIBA FLEXTOUCH) 200 UNIT/ML FlexTouch Pen Inject 50 Units into the skin at bedtime. Patient taking differently: Inject 46-48 Units into the skin at bedtime. 01/12/21   Philemon Kingdom, MD  Insulin Pen Needle (PEN NEEDLES) 31G X 6 MM MISC See admin instructions. 01/31/20   [provider]  Lancets [facility] DELICA PLUS [id number]) MISC Use 4 lancets per day to check blood sugar 02/11/21   Philemon Kingdom, MD  LORazepam (ATIVAN) 1 MG tablet Take 1 mg by mouth daily as needed (Nausea).    [provider]  Melatonin 10 MG TABS Take 1 tablet by mouth daily.    [provider]  naproxen (NAPROSYN) 500 MG tablet TAKE 1 TABLET(500 MG) BY MOUTH TWICE DAILY WITH A MEAL Patient taking differently: Take 500 mg by mouth 2 (two) times daily with a meal. 01/08/21   Fayrene Helper, MD  omeprazole (PRILOSEC) 40 MG capsule Take 1 capsule (40 mg total) by mouth daily. 10/11/20   Fayrene Helper, MD  [facility] VERIO test strip USE AS DIRECTED FOUR TIMES DAILY 08/22/20   Fayrene Helper, MD  oxyCODONE-acetaminophen (PERCOCET) 5-325 MG tablet Take one tablet by mouth two tmes daily , as needed, for uncontrolled leg pain 02/05/21 03/08/21  Fayrene Helper, MD  rifaximin (XIFAXAN) 550 MG TABS tablet Take 1 tablet (550 mg total) by mouth 3 (three) times daily for 14 days. 02/26/21 03/12/21  Harvel Quale, MD  rosuvastatin (CRESTOR) 20 MG tablet Take 1 tablet (20 mg total) by mouth daily. 01/12/21   Fayrene Helper, MD  temazepam (RESTORIL) 15 MG capsule Take 1 capsule (15 mg total) by mouth at bedtime. 11/03/20   Fayrene Helper, MD  traZODone (DESYREL) 100 MG tablet Take 1 tablet (100 mg total) by mouth at  bedtime. 01/12/21   Fayrene Helper, MD  venlafaxine XR (EFFEXOR-XR) 75 MG 24 hr capsule TAKE 1 CAPSULE(75 MG) BY MOUTH DAILY Patient taking differently: Take 75 mg by mouth daily with breakfast. 01/05/21   Fayrene Helper, MD    Family History    Family History  Problem Relation Age of Onset  . Diabetes Mother   . Arrhythmia Mother   . Heart disease Father   . Hyperlipidemia  Father   . Arrhythmia Father   . Cancer Paternal Grandmother        Breast  . Diabetes Sister   . Diabetes Brother   . Heart disease Brother    She indicated that her mother is deceased. She indicated that her father is deceased. She indicated that her sister is alive. She indicated that both of her brothers are deceased. She indicated that her paternal grandmother is deceased. She indicated that her child is alive. She indicated that her other is alive.  Social History    Social History   Socioeconomic History  . Marital status: Married    Spouse name: Alroy Dust  . Number of children: 2  . Years of education: Not on file  . Highest education level: Not on file  Occupational History  . Not on file  Tobacco Use  . Smoking status: Current Every Day Smoker    Packs/day: 0.50    Years: 40.00    Pack years: 20.00    Types: Cigarettes  . Smokeless tobacco: Never Used  . Tobacco comment: 1/2 pack a day  Vaping Use  . Vaping Use: Never used  Substance and Sexual Activity  . Alcohol use: No    Alcohol/week: 0.0 standard drinks    Comment: Encouraged to quit smoking. She has tried the nicotone gum and patches but did not help  . Drug use: No  . Sexual activity: Yes    Birth control/protection: Surgical  Other Topics Concern  . Not on file  Social History Narrative  . Not on file   Social Determinants of Health   Financial Resource Strain: Medium Risk  . Difficulty of Paying Living Expenses: Somewhat hard  Food Insecurity: No Food Insecurity  . Worried About Charity fundraiser in the Last  Year: Never true  . Ran Out of Food in the Last Year: Never true  Transportation Needs: No Transportation Needs  . Lack of Transportation (Medical): No  . Lack of Transportation (Non-Medical): No  Physical Activity: Inactive  . Days of Exercise per Week: 0 days  . Minutes of Exercise per Session: 0 min  Stress: Stress Concern Present  . Feeling of Stress : To some extent  Social Connections: Moderately Integrated  . Frequency of Communication with Friends and Family: Three times a week  . Frequency of Social Gatherings with Friends and Family: Three times a week  . Attends Religious Services: More than 4 times per year  . Active Member of Clubs or Organizations: No  . Attends Archivist Meetings: Never  . Marital Status: Married  Human resources officer Violence: Not At Risk  . Fear of Current or Ex-Partner: No  . Emotionally Abused: No  . Physically Abused: No  . Sexually Abused: No     Review of Systems    General:  No chills, fever, night sweats or weight changes.  Cardiovascular:  No chest pain, dyspnea on exertion, edema, orthopnea, palpitations, paroxysmal nocturnal dyspnea. Dermatological: No rash, lesions/masses Respiratory: No cough, dyspnea Urologic: No hematuria, dysuria Abdominal:   No nausea, vomiting, diarrhea, bright red blood per rectum, melena, or hematemesis Neurologic:  No visual changes, wkns, changes in mental status. All other systems reviewed and are otherwise negative except as noted above.  Physical Exam    VS:  BP (!) 90/58   Pulse 84   Ht 5' 5.5" (1.664 m)   Wt 193 lb 6.4 oz (87.7 kg)   SpO2 98%   BMI 31.69 kg/m  ,  BMI Body mass index is 31.69 kg/m. GEN: Well nourished, well developed, in no acute distress. HEENT: normal. Neck: Supple, no JVD, carotid bruits, or masses. Cardiac: RRR, no murmurs, rubs, or gallops. No clubbing, cyanosis, edema.  Radials/DP/PT 2+ and equal bilaterally.  Respiratory:  Respirations regular and unlabored, clear  to auscultation bilaterally. GI: Soft, nontender, nondistended, BS + x 4. MS: no deformity or atrophy. Skin: warm and dry, no rash. Neuro:  Strength and sensation are intact. Psych: Normal affect.  Accessory Clinical Findings    Recent Labs: 01/16/2021: TSH 1.835 01/17/2021: ALT 25 01/23/2021: Hemoglobin 12.2; Platelets 303 02/05/2021: BUN 12; Creatinine, Ser 0.90; Magnesium 2.1; Potassium 5.0; Sodium 146   Recent Lipid Panel    Component Value Date/Time   CHOL 131 10/07/2020 1137   TRIG 124 10/07/2020 1137   HDL 43 10/07/2020 1137   CHOLHDL 3.0 10/07/2020 1137   CHOLHDL 5.0 11/15/2014 1119   VLDL 25 11/15/2014 1119   LDLCALC 66 10/07/2020 1137    ECG personally reviewed by me today-none today.    Cardiac event monitor 07/03/2019 Patient had a min HR of 54 bpm (sinus bradycardia), max HR of 112 bpm (sinus tachycardia), and avg HR of 74 bpm (normal sinus rhythm). Predominant underlying rhythm was Sinus Rhythm. Isolated SVEs were rare (<1.0%), SVE Couplets were rare (<1.0%), and no SVE Triplets were present. Isolated VEs were rare (<1.0%), and no VE Couplets or VE Triplets were present. No atrial fibrillation or flutter.   There was 1 patient triggered event associated with diary entry.   06/14/2019 5:46 PM: Symptoms of dizziness reported that coincided with normal sinus rhythm @ 86 bpm.   Impression:  1. No significant arrhythmias detected.  2. Symptoms of dizziness do not appear to be related to any arrhythmia.   Evalina Field, MD   Echocardiogram 01/17/2021 IMPRESSIONS    1. Left ventricular ejection fraction, by estimation, is 60 to 65%. The  left ventricle has normal function. The left ventricle has no regional  wall motion abnormalities. Left ventricular diastolic parameters were  normal.  2. Right ventricular systolic function is normal. The right ventricular  size is normal. Tricuspid regurgitation signal is inadequate for assessing  PA pressure.  3. The  mitral valve is normal in structure. Trivial mitral valve  regurgitation. No evidence of mitral stenosis.  4. The aortic valve is normal in structure. Aortic valve regurgitation is  not visualized. No aortic stenosis is present.  5. The inferior vena cava is normal in size with greater than 50%  respiratory variability, suggesting right atrial pressure of 3 mmHg.  Assessment & Plan   1.  Chest pain- no recent episodes of chest discomfort or arm neck or back pain.  Previously noted intermittent episodes of nonexertional chest pain.  Underwent cardiac event monitoring which showed no significant arrhythmias.  Details above.  Echocardiogram was normal.  CT angio chest 01/16/2021 showed no coronary artery calcifications. Continue metoprolol Heart healthy low-sodium diet-salty 6 given Increase physical activity as tolerated  Hyperlipidemia- LDL 66 on 10/07/20 Continue Crestor Heart healthy low-sodium high-fiber diet Increase physical activity as tolerated  Tobacco abuse- smoking cessation recommended. 1/2 pack per day.  Smoking cessation information given.  Disposition: Follow-up with Dr. Audie Box in 6 months.  Jossie Ng. Laurier Jasperson NP-C    03/04/2021, 4:03 PM [facility] [address] Office [phone] Fax [phone]  Notice: This dictation was prepared with Dragon dictation along with smaller phrase technology. Any transcriptional errors  that result from this process are unintentional and may not be corrected upon review.  I spent 15 minutes examining this patient, reviewing medications, and using patient centered shared decision making involving her cardiac care.  Prior to her visit I spent greater than 20 minutes reviewing her past medical history,  medications, and prior cardiac tests.

## 2021-03-03 ENCOUNTER — Other Ambulatory Visit (HOSPITAL_COMMUNITY): Payer: Self-pay

## 2021-03-03 DIAGNOSIS — Z122 Encounter for screening for malignant neoplasm of respiratory organs: Secondary | ICD-10-CM

## 2021-03-03 DIAGNOSIS — Z87891 Personal history of nicotine dependence: Secondary | ICD-10-CM

## 2021-03-03 NOTE — Progress Notes (Signed)
Patient scheduled for follow-up LDCT on 03/27/2021 at 1500. Patient made aware.

## 2021-03-04 ENCOUNTER — Telehealth (INDEPENDENT_AMBULATORY_CARE_PROVIDER_SITE_OTHER): Payer: Self-pay

## 2021-03-04 ENCOUNTER — Telehealth (INDEPENDENT_AMBULATORY_CARE_PROVIDER_SITE_OTHER): Payer: BC Managed Care – PPO | Admitting: Nurse Practitioner

## 2021-03-04 ENCOUNTER — Other Ambulatory Visit (INDEPENDENT_AMBULATORY_CARE_PROVIDER_SITE_OTHER): Payer: Self-pay

## 2021-03-04 ENCOUNTER — Encounter: Payer: Self-pay | Admitting: Nurse Practitioner

## 2021-03-04 ENCOUNTER — Encounter (INDEPENDENT_AMBULATORY_CARE_PROVIDER_SITE_OTHER): Payer: Self-pay

## 2021-03-04 ENCOUNTER — Other Ambulatory Visit: Payer: Self-pay

## 2021-03-04 ENCOUNTER — Ambulatory Visit (INDEPENDENT_AMBULATORY_CARE_PROVIDER_SITE_OTHER): Payer: BC Managed Care – PPO | Admitting: General Practice

## 2021-03-04 ENCOUNTER — Encounter: Payer: Self-pay | Admitting: General Practice

## 2021-03-04 VITALS — BP 90/58 | HR 84 | Ht 65.5 in | Wt 193.4 lb

## 2021-03-04 DIAGNOSIS — E782 Mixed hyperlipidemia: Secondary | ICD-10-CM | POA: Diagnosis not present

## 2021-03-04 DIAGNOSIS — R079 Chest pain, unspecified: Secondary | ICD-10-CM

## 2021-03-04 DIAGNOSIS — G629 Polyneuropathy, unspecified: Secondary | ICD-10-CM

## 2021-03-04 DIAGNOSIS — Z720 Tobacco use: Secondary | ICD-10-CM | POA: Diagnosis not present

## 2021-03-04 MED ORDER — GABAPENTIN 300 MG PO CAPS: 300.0000 mg | ORAL_CAPSULE | Freq: Three times a day (TID) | ORAL | 1 refills | Status: DC

## 2021-03-04 MED ORDER — PEG 3350-KCL-NA BICARB-NACL 420 G PO SOLR: 4000.0000 mL | ORAL | 0 refills | Status: DC

## 2021-03-04 NOTE — Telephone Encounter (Signed)
Annette [name], CMA  

## 2021-03-04 NOTE — Progress Notes (Signed)
Acute Office Visit  Subjective:    Patient ID: Annette [name], female    DOB: 11/12/1960, 60 y.o.   MRN: [id number]  Chief Complaint  Patient presents with  . Medication Refill    Pt is needing Gabapentin - Pt states when she was in the hospital they were giving her 2 PO TID and on her on discharge instructions it says to increase to 3 PO TID    HPI Patient is in today for refill on gabapentin.  She states that she has ongoing leg pain in bilateral legs since having her back surgery.  She rates her pain at 4/10, but it is higher at night.  She states she was discharged taking 200 mg TID, and her dose was supposed to increase to 300 mg TID. This was confirmed by looking at her AVS from 01/16/21.  Past Medical History:  Diagnosis Date  . Arthritis    Phreesia 10/14/2020  . Blood transfusion without reported diagnosis    Phreesia 10/14/2020  . Breast cancer ([place]) 2007   History of  XRT, onTamoxifen  . Bruises easily   . Cancer ([place])    Phreesia 10/14/2020  . Depression   . Depression    Phreesia 10/14/2020  . Diabetes mellitus    prediabetic  . Diabetes mellitus without complication ([place])    Phreesia 10/14/2020  . GERD (gastroesophageal reflux disease)   . Hypertriglyceridemia   . Hypothyroidism    following chemo amnd radiaition for breast cancer, needed replacement short term  . Hypothyroidism (acquired)    replaced x 1 year  . IBS (irritable bowel syndrome)   . Menieres disease    Controlled with triamterene   . Migraines   . Obesity   . Palpitations   . 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[place])   . Sleep apnea 2010   Problems with CPAP    Past Surgical History:  Procedure Laterality Date  . ABDOMINAL EXPOSURE N/A 12/03/2020   Procedure: ABDOMINAL EXPOSURE;  Surgeon: Rosetta Posner, MD;  Location: [facility] OR;  Service: Vascular;  Laterality: N/A;  . ABDOMINAL HYSTERECTOMY  1998   Benign, fibroids  . ADRENALECTOMY  02/16/2012   [facility], splemic trauma, 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   . ANTERIOR LUMBAR FUSION N/A 12/03/2020   Procedure: Lumbar Four-Five Lumbar Five Sacral One Anterior lumbar interbody fusion;  Surgeon: Ashok Pall, MD;  Location: [place];  Service: Neurosurgery;  Laterality: N/A;  . APPENDECTOMY  06/12/08  . BIOPSY  07/02/2020   Procedure: BIOPSY;  Surgeon: Harvel Quale, MD;  [facility];  Service: Gastroenterology;;  . BIOPSY  01/22/2021   Procedure: BIOPSY;  Surgeon: Daneil Dolin, MD;  [facility];  Service: Endoscopy;;  . BREAST LUMPECTOMY Left 2008  . BREAST RECONSTRUCTION     Left reconstructive  . BREAST SURGERY  2007   Left lumpectomy  . BREAST SURGERY     [place]amm[place] - right side  . CHOLECYSTECTOMY  1985  . COLONOSCOPY WITH PROPOFOL N/A 07/02/2020   Procedure: COLONOSCOPY WITH PROPOFOL;  Surgeon: Harvel Quale, MD;  [facility];  Service: Gastroenterology;  Laterality: N/A;  1045  . ESOPHAGEAL DILATION  12/19/2015   Procedure: ESOPHAGEAL DILATION;  Surgeon: Rogene Houston, MD;  [facility];  Service: Endoscopy;;  . ESOPHAGOGASTRODUODENOSCOPY N/A 12/19/2015   Procedure: ESOPHAGOGASTRODUODENOSCOPY (EGD);  Surgeon: Rogene Houston, MD;  [facility];  Service: Endoscopy;  Laterality: N/A;  11:40  . FLEXIBLE SIGMOIDOSCOPY N/A 01/22/2021  Procedure: FLEXIBLE SIGMOIDOSCOPY;  Surgeon: Daneil Dolin, MD;  [facility];  Service: Endoscopy;  Laterality: N/A;  . POLYPECTOMY  07/02/2020   Procedure: POLYPECTOMY;  Surgeon: Harvel Quale, MD;  [facility];  Service: Gastroenterology;;  . 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 THYROIDECTOMY, PARTIAL  11/05/2010   Benign disease    Family History  Problem Relation Age of Onset  . Diabetes Mother   . Arrhythmia Mother   . Heart disease Father   . Hyperlipidemia Father   . Arrhythmia Father   . Cancer Paternal Grandmother        Breast  .  Diabetes Sister   . Diabetes Brother   . Heart disease Brother     Social History   Socioeconomic History  . Marital status: Married    Spouse name: Alroy Dust  . Number of children: 2  . Years of education: Not on file  . Highest education level: Not on file  Occupational History  . Not on file  Tobacco Use  . Smoking status: Current Every Day Smoker    Packs/day: 0.50    Years: 40.00    Pack years: 20.00    Types: Cigarettes  . Smokeless tobacco: Never Used  . Tobacco comment: 1/2 pack a day  Vaping Use  . Vaping Use: Never used  Substance and Sexual Activity  . Alcohol use: No    Alcohol/week: 0.0 standard drinks    Comment: Encouraged to quit smoking. She has tried the nicotone gum and patches but did not help  . Drug use: No  . Sexual activity: Yes    Birth control/protection: Surgical  Other Topics Concern  . Not on file  Social History Narrative  . Not on file   Social Determinants of Health   Financial Resource Strain: Medium Risk  . Difficulty of Paying Living Expenses: Somewhat hard  Food Insecurity: No Food Insecurity  . Worried About Charity fundraiser in the Last Year: Never true  . Ran Out of Food in the Last Year: Never true  Transportation Needs: No Transportation Needs  . Lack of Transportation (Medical): No  . Lack of Transportation (Non-Medical): No  Physical Activity: Inactive  . Days of Exercise per Week: 0 days  . Minutes of Exercise per Session: 0 min  Stress: Stress Concern Present  . Feeling of Stress : To some extent  Social Connections: Moderately Integrated  . Frequency of Communication with Friends and Family: Three times a week  . Frequency of Social Gatherings with Friends and Family: Three times a week  . Attends Religious Services: More than 4 times per year  . Active Member of Clubs or Organizations: No  . Attends Archivist Meetings: Never  . Marital Status: Married  Human resources officer Violence: Not At Risk  . Fear  of Current or Ex-Partner: No  . Emotionally Abused: No  . Physically Abused: No  . Sexually Abused: No    Outpatient Medications Prior to Visit  Medication Sig Dispense Refill  . aspirin 81 MG EC tablet Take 81 mg by mouth daily.    Marland Kitchen CLOTRIMAZOLE EX Apply 1 application topically daily as needed (itching an breast). 15 %    . cyclobenzaprine (FLEXERIL) 10 MG tablet Take 1 tablet (10 mg total) by mouth 3 (three) times daily as needed for muscle spasms. 30 tablet 0  . Dulaglutide (TRULICITY) 1.5 RA/0.7MA SOPN Inject 0.5 mLs (  1.5 mg total) into the skin once a week. 4 pen 11  . glucose blood test strip See admin instructions.    . insulin degludec (TRESIBA FLEXTOUCH) 200 UNIT/ML FlexTouch Pen Inject 50 Units into the skin at bedtime. (Patient taking differently: Inject 46-48 Units into the skin at bedtime.) 18 mL 1  . Insulin Pen Needle (PEN NEEDLES) 31G X 6 MM MISC See admin instructions.    . Lancets (ONETOUCH DELICA PLUS [id number]) MISC Use 4 lancets per day to check blood sugar 200 each 3  . LORazepam (ATIVAN) 1 MG tablet Take 1 mg by mouth daily as needed (Nausea).    . Melatonin 10 MG TABS Take 1 tablet by mouth daily.    . naproxen (NAPROSYN) 500 MG tablet TAKE 1 TABLET(500 MG) BY MOUTH TWICE DAILY WITH A MEAL (Patient taking differently: Take 500 mg by mouth 2 (two) times daily with a meal.) 40 tablet 0  . omeprazole (PRILOSEC) 40 MG capsule Take 1 capsule (40 mg total) by mouth daily. 30 capsule 5  . oxyCODONE-acetaminophen (PERCOCET) 5-325 MG tablet Take one tablet by mouth two tmes daily , as needed, for uncontrolled leg pain 10 tablet 0  . polyethylene glycol-electrolytes (TRILYTE) 420 g solution Take 4,000 mLs by mouth as directed. 4000 mL 0  . rifaximin (XIFAXAN) 550 MG TABS tablet Take 1 tablet (550 mg total) by mouth 3 (three) times daily for 14 days. 42 tablet 0  . rosuvastatin (CRESTOR) 20 MG tablet Take 1 tablet (20 mg total) by mouth daily. 90 tablet 1  . temazepam (RESTORIL)  15 MG capsule Take 1 capsule (15 mg total) by mouth at bedtime. 30 capsule 5  . traZODone (DESYREL) 100 MG tablet TAKE 1 TABLET(100 MG) BY MOUTH AT BEDTIME 90 tablet 1  . venlafaxine XR (EFFEXOR-XR) 75 MG 24 hr capsule TAKE 1 CAPSULE(75 MG) BY MOUTH DAILY (Patient taking differently: Take 75 mg by mouth daily with breakfast.) 90 capsule 0  . Vitamin D, Ergocalciferol, (DRISDOL) 1.25 MG (50000 UNIT) CAPS capsule TAKE 1 CAPSULE BY MOUTH ONCE A WEEK 12 capsule 0  . gabapentin (NEURONTIN) 100 MG capsule Take 2 capsules (200 mg total) by mouth 3 (three) times daily. In three days, increase to 300 mg TID 90 capsule 3  . ONETOUCH VERIO test strip USE AS DIRECTED FOUR TIMES DAILY 150 strip 5   No facility-administered medications prior to visit.    Allergies  Allergen Reactions  . Penicillins Swelling    Has patient had a PCN reaction causing immediate rash, facial/tongue/throat swelling, SOB or lightheadedness with hypotension: Yes Has patient had a PCN reaction causing severe rash involving mucus membranes or skin necrosis: No Has patient had a PCN reaction that required hospitalization: No  Has patient had a PCN reaction occurring within the last 10 years: No If all of the above answers are "NO", then may proceed with Cephalosporin use.   Of face and tongue.  . Metformin And Related Diarrhea and Nausea Only    Pt to discontinue med due to intolerance, cramps and diarrhea  . Wound Dressing Adhesive Itching and Rash    Dermabond and other surgical glues for incisions  . Empagliflozin Other (See Comments)    Yeast infection  Jardiance  . Penicillin G Other (See Comments)  . Statins Other (See Comments)    Fell bad but on Crestor  . Zetia [Ezetimibe] Other (See Comments)    Feels funny    Review of Systems  Constitutional: Negative.  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 Wt Readings from Last 3 Encounters:  02/26/21 202 lb (91.6 kg)  02/05/21 202 lb (91.6 kg)  01/16/21 201 lb (91.2 kg)    Health Maintenance Due  Topic Date Due  . COVID-19 Vaccine (1) Never done  . Zoster Vaccines- Shingrix (1 of 2) Never done    There are no preventive care reminders to display for this patient.   Lab Results  Component Value Date   TSH 1.835 01/16/2021   Lab Results  Component Value Date   WBC 10.1 01/23/2021   HGB 12.2 01/23/2021   HCT 37.4 01/23/2021   MCV 102.5 (H) 01/23/2021   PLT 303 01/23/2021   Lab Results  Component Value Date   NA 146 (H) 02/05/2021   K 5.0 02/05/2021   CO2 24 02/05/2021   GLUCOSE 86 02/05/2021   BUN 12 02/05/2021   CREATININE 0.90 02/05/2021   BILITOT 0.5 01/17/2021   ALKPHOS 78 01/17/2021   AST 27 01/17/2021   ALT 25 01/17/2021   PROT 7.1 01/17/2021   ALBUMIN 3.6 01/17/2021   CALCIUM 10.3 02/05/2021   ANIONGAP 8 01/23/2021   EGFR 73 02/05/2021   Lab Results  Component Value Date   CHOL 131 10/07/2020   Lab Results  Component Value Date   HDL 43 10/07/2020   Lab Results  Component Value Date   LDLCALC 66 10/07/2020   Lab Results  Component Value Date   TRIG 124 10/07/2020   Lab Results  Component Value Date   CHOLHDL 3.0 10/07/2020   Lab Results  Component Value Date   HGBA1C 6.0 (H) 01/17/2021       Assessment & Plan:   Problem List Items Addressed This Visit      Nervous and Auditory   Neuropathy    -was started on gabapentin for neuropathy around 01/16/21 -discharge summary states to increase dose to 300 mg TID, but she didn't receive rx for this at time of her discharge and she is running out of medicaiton -Rx. Gabapentin 300 mg TID          Meds ordered this encounter  Medications  . gabapentin (NEURONTIN) 300 MG capsule    Sig: Take 1 capsule (300 mg total) by mouth 3 (three) times daily.    Dispense:  270 capsule    Refill:  1   Date:  03/04/2021   Location of  Patient: Home Location of Provider: Office Consent was obtain for visit to be over via telehealth. I verified that I am speaking with the correct person using two identifiers.  I connected with  Eston Esters on 03/04/21 via telephone and verified that I am speaking with the correct person using two identifiers.   I discussed the limitations of evaluation and management by telemedicine. The patient expressed understanding and agreed to proceed.  Time spent: 9 minutes   Noreene Larsson, NP

## 2021-03-04 NOTE — Assessment & Plan Note (Signed)
-  was started on gabapentin for neuropathy around 01/16/21 -discharge summary states to increase dose to 300 mg TID, but she didn't receive rx for this at time of her discharge and she is running out of medicaiton -Rx. Gabapentin 300 mg TID

## 2021-03-04 NOTE — Patient Instructions (Addendum)
Medication Instructions:  Your physician recommends that you continue on your current medications as directed. Please refer to the Current Medication list given to you today.  *If you need a refill on your cardiac medications before your next appointment, please call your pharmacy*   Lab Work: None ordered.   Testing/Procedures: None ordered.    Follow-Up: At [facility], you and your health needs are our priority.  As part of our continuing mission to provide you with exceptional heart care, we have created designated Provider Care Teams.  These Care Teams include your primary Cardiologist (physician) and Advanced Practice Providers (APPs -  Physician Assistants and Nurse Practitioners) who all work together to provide you with the care you need, when you need it.  We recommend signing up for the patient portal called "MyChart".  Sign up information is provided on this After Visit Summary.  MyChart is used to connect with patients for Virtual Visits (Telemedicine).  Patients are able to view lab/test results, encounter notes, upcoming appointments, etc.  Non-urgent messages can be sent to your provider as well.   To learn more about what you can do with MyChart, go to [email or web address].    Your next appointment:   6 month(s)  The format for your next appointment:   In Person  Provider:   Eleonore Chiquito, MD   Other Instructions Please review the smoking cessation information sheet provided.  Per Coletta Memos increase activity as tolerated.     Steps to Quit Smoking Smoking tobacco is the leading cause of preventable death. It can affect almost every organ in the body. Smoking puts you and people around you at risk for many serious, long-lasting (chronic) diseases. Quitting smoking can be hard, but it is one of the best things that you can do for your health. It is never too late to quit. How do I get ready to quit? When you decide to quit smoking, make a plan to help  you succeed. Before you quit:  Pick a date to quit. Set a date within the next 2 weeks to give you time to prepare.  Write down the reasons why you are quitting. Keep this list in places where you will see it often.  Tell your family, friends, and co-workers that you are quitting. Their support is important.  Talk with your doctor about the choices that may help you quit.  Find out if your health insurance will pay for these treatments.  Know the people, places, things, and activities that make you want to smoke (triggers). Avoid them. What first steps can I take to quit smoking?  Throw away all cigarettes at home, at work, and in your car.  Throw away the things that you use when you smoke, such as ashtrays and lighters.  Clean your car. Make sure to empty the ashtray.  Clean your home, including curtains and carpets. What can I do to help me quit smoking? Talk with your doctor about taking medicines and seeing a counselor at the same time. You are more likely to succeed when you do both.  If you are pregnant or breastfeeding, talk with your doctor about counseling or other ways to quit smoking. Do not take medicine to help you quit smoking unless your doctor tells you to do so. To quit smoking: Quit right away  Quit smoking totally, instead of slowly cutting back on how much you smoke over a period of time.  Go to counseling. You are more likely to quit if  you go to counseling sessions regularly. Take medicine You may take medicines to help you quit. Some medicines need a prescription, and some you can buy over-the-counter. Some medicines may contain a drug called nicotine to replace the nicotine in cigarettes. Medicines may:  Help you to stop having the desire to smoke (cravings).  Help to stop the problems that come when you stop smoking (withdrawal symptoms). Your doctor may ask you to use:  Nicotine patches, gum, or lozenges.  Nicotine inhalers or  sprays.  Non-nicotine medicine that is taken by mouth. Find resources Find resources and other ways to help you quit smoking and remain smoke-free after you quit. These resources are most helpful when you use them often. They include:  Online chats with a Social worker.  Phone quitlines.  Printed Furniture conservator/restorer.  Support groups or group counseling.  Text messaging programs.  Mobile phone apps. Use apps on your mobile phone or tablet that can help you stick to your quit plan. There are many free apps for mobile phones and tablets as well as websites. Examples include Quit Guide from the [employer] and smokefree.gov   What things can I do to make it easier to quit?  Talk to your family and friends. Ask them to support and encourage you.  Call a phone quitline (1-800-QUIT-NOW), reach out to support groups, or work with a Social worker.  Ask people who smoke to not smoke around you.  Avoid places that make you want to smoke, such as: ? Bars. ? Parties. ? Smoke-break areas at work.  Spend time with people who do not smoke.  Lower the stress in your life. Stress can make you want to smoke. Try these things to help your stress: ? Getting regular exercise. ? Doing deep-breathing exercises. ? Doing yoga. ? Meditating. ? Doing a body scan. To do this, close your eyes, focus on one area of your body at a time from head to toe. Notice which parts of your body are tense. Try to relax the muscles in those areas.   How will I feel when I quit smoking? Day 1 to 3 weeks Within the first 24 hours, you may start to have some problems that come from quitting tobacco. These problems are very bad 2-3 days after you quit, but they do not often last for more than 2-3 weeks. You may get these symptoms:  Mood swings.  Feeling restless, nervous, angry, or annoyed.  Trouble concentrating.  Dizziness.  Strong desire for high-sugar foods and nicotine.  Weight gain.  Trouble pooping  (constipation).  Feeling like you may vomit (nausea).  Coughing or a sore throat.  Changes in how the medicines that you take for other issues work in your body.  Depression.  Trouble sleeping (insomnia). Week 3 and afterward After the first 2-3 weeks of quitting, you may start to notice more positive results, such as:  Better sense of smell and taste.  Less coughing and sore throat.  Slower heart rate.  Lower blood pressure.  Clearer skin.  Better breathing.  Fewer sick days. Quitting smoking can be hard. Do not give up if you fail the first time. Some people need to try a few times before they succeed. Do your best to stick to your quit plan, and talk with your doctor if you have any questions or concerns. Summary  Smoking tobacco is the leading cause of preventable death. Quitting smoking can be hard, but it is one of the best things that you can do  for your health.  When you decide to quit smoking, make a plan to help you succeed.  Quit smoking right away, not slowly over a period of time.  When you start quitting, seek help from your doctor, family, or friends. This information is not intended to replace advice given to you by your health care provider. Make sure you discuss any questions you have with your health care provider. Document Revised: 06/15/2019 Document Reviewed: 12/09/2018 Elsevier Patient Education  [place].

## 2021-03-10 ENCOUNTER — Ambulatory Visit: Payer: BC Managed Care – PPO | Admitting: Family Medicine

## 2021-03-23 ENCOUNTER — Other Ambulatory Visit (HOSPITAL_COMMUNITY)
Admission: RE | Admit: 2021-03-23 | Discharge: 2021-03-23 | Disposition: A | Discharge status: Home / Self Care | Payer: BC Managed Care – PPO | Source: Ambulatory Visit | Attending: Gastroenterology | Admitting: Gastroenterology

## 2021-03-23 ENCOUNTER — Other Ambulatory Visit: Payer: Self-pay

## 2021-03-23 DIAGNOSIS — Z01812 Encounter for preprocedural laboratory examination: Secondary | ICD-10-CM | POA: Insufficient documentation

## 2021-03-23 LAB — BASIC METABOLIC PANEL
Anion gap: 9 (ref 5–15)
BUN: 12 mg/dL (ref 8–23)
CO2: 27 mmol/L (ref 22–32)
Calcium: 9.5 mg/dL (ref 8.9–10.3)
Chloride: 100 mmol/L (ref 98–111)
Creatinine, Ser: 1.01 mg/dL — ABNORMAL HIGH (ref 0.44–1.00)
GFR, Estimated: >60 mL/min (ref >60)
Glucose, Bld: 84 mg/dL (ref 70–99)
Potassium: 4.2 mmol/L (ref 3.5–5.1)
Sodium: 136 mmol/L (ref 135–145)

## 2021-03-24 ENCOUNTER — Telehealth: Payer: Self-pay

## 2021-03-24 ENCOUNTER — Telehealth (INDEPENDENT_AMBULATORY_CARE_PROVIDER_SITE_OTHER): Payer: Self-pay

## 2021-03-24 ENCOUNTER — Other Ambulatory Visit: Payer: Self-pay

## 2021-03-24 DIAGNOSIS — F5104 Psychophysiologic insomnia: Secondary | ICD-10-CM

## 2021-03-24 DIAGNOSIS — F5101 Primary insomnia: Secondary | ICD-10-CM

## 2021-03-24 MED ORDER — VENLAFAXINE HCL ER 75 MG PO CP24: 75.0000 mg | ORAL_CAPSULE | Freq: Every day | ORAL | 0 refills | Status: DC

## 2021-03-24 NOTE — Telephone Encounter (Signed)
Rx refilled.

## 2021-03-24 NOTE — Telephone Encounter (Signed)
Patient called needing refills on venlafaxine ph# [phone]

## 2021-03-25 NOTE — Telephone Encounter (Signed)
I called patient again, no answer.

## 2021-03-26 NOTE — Telephone Encounter (Signed)
Per Patient she has not started the medication as of yet,but states she will develop symptoms. I advised if she did she would per Dr. Jenetta Downer have to get treatment from pcp. Patient states understanding.

## 2021-03-27 ENCOUNTER — Ambulatory Visit (HOSPITAL_COMMUNITY): Admission: RE | Admit: 2021-03-27 | Payer: BC Managed Care – PPO | Source: Ambulatory Visit

## 2021-03-27 ENCOUNTER — Encounter (HOSPITAL_COMMUNITY): Payer: Self-pay

## 2021-04-01 ENCOUNTER — Ambulatory Visit (HOSPITAL_COMMUNITY): Payer: BC Managed Care – PPO | Admitting: Certified Registered"

## 2021-04-01 ENCOUNTER — Other Ambulatory Visit: Payer: Self-pay

## 2021-04-01 ENCOUNTER — Ambulatory Visit (HOSPITAL_COMMUNITY)
Admission: RE | Admit: 2021-04-01 | Discharge: 2021-04-01 | Disposition: A | Discharge status: Home / Self Care | Payer: BC Managed Care – PPO | Attending: Gastroenterology | Admitting: Gastroenterology

## 2021-04-01 ENCOUNTER — Encounter (HOSPITAL_COMMUNITY): Payer: Self-pay | Admitting: Gastroenterology

## 2021-04-01 ENCOUNTER — Encounter (HOSPITAL_COMMUNITY)
Admission: RE | Disposition: A | Discharge status: Home / Self Care | Payer: Self-pay | Source: Home / Self Care | Attending: Gastroenterology

## 2021-04-01 DIAGNOSIS — K219 Gastro-esophageal reflux disease without esophagitis: Secondary | ICD-10-CM | POA: Diagnosis not present

## 2021-04-01 DIAGNOSIS — Z6832 Body mass index (BMI) 32.0-32.9, adult: Secondary | ICD-10-CM | POA: Insufficient documentation

## 2021-04-01 DIAGNOSIS — Z86010 Personal history of colonic polyps: Secondary | ICD-10-CM | POA: Diagnosis not present

## 2021-04-01 DIAGNOSIS — D122 Benign neoplasm of ascending colon: Secondary | ICD-10-CM | POA: Insufficient documentation

## 2021-04-01 DIAGNOSIS — F32A Depression, unspecified: Secondary | ICD-10-CM | POA: Insufficient documentation

## 2021-04-01 DIAGNOSIS — Z7982 Long term (current) use of aspirin: Secondary | ICD-10-CM | POA: Insufficient documentation

## 2021-04-01 DIAGNOSIS — Z880 Allergy status to penicillin: Secondary | ICD-10-CM | POA: Insufficient documentation

## 2021-04-01 DIAGNOSIS — E785 Hyperlipidemia, unspecified: Secondary | ICD-10-CM | POA: Insufficient documentation

## 2021-04-01 DIAGNOSIS — Z881 Allergy status to other antibiotic agents status: Secondary | ICD-10-CM | POA: Insufficient documentation

## 2021-04-01 DIAGNOSIS — F17210 Nicotine dependence, cigarettes, uncomplicated: Secondary | ICD-10-CM | POA: Diagnosis not present

## 2021-04-01 DIAGNOSIS — Z09 Encounter for follow-up examination after completed treatment for conditions other than malignant neoplasm: Secondary | ICD-10-CM | POA: Diagnosis not present

## 2021-04-01 DIAGNOSIS — K580 Irritable bowel syndrome with diarrhea: Secondary | ICD-10-CM | POA: Diagnosis not present

## 2021-04-01 DIAGNOSIS — Z888 Allergy status to other drugs, medicaments and biological substances status: Secondary | ICD-10-CM | POA: Insufficient documentation

## 2021-04-01 DIAGNOSIS — D120 Benign neoplasm of cecum: Secondary | ICD-10-CM | POA: Diagnosis not present

## 2021-04-01 DIAGNOSIS — Z853 Personal history of malignant neoplasm of breast: Secondary | ICD-10-CM | POA: Insufficient documentation

## 2021-04-01 DIAGNOSIS — Z794 Long term (current) use of insulin: Secondary | ICD-10-CM | POA: Insufficient documentation

## 2021-04-01 DIAGNOSIS — E119 Type 2 diabetes mellitus without complications: Secondary | ICD-10-CM | POA: Diagnosis not present

## 2021-04-01 DIAGNOSIS — Z79899 Other long term (current) drug therapy: Secondary | ICD-10-CM | POA: Insufficient documentation

## 2021-04-01 DIAGNOSIS — E669 Obesity, unspecified: Secondary | ICD-10-CM | POA: Insufficient documentation

## 2021-04-01 HISTORY — PX: COLONOSCOPY WITH PROPOFOL: SHX5780

## 2021-04-01 HISTORY — PX: HEMOSTASIS CLIP PLACEMENT: SHX6857

## 2021-04-01 HISTORY — PX: POLYPECTOMY: SHX149

## 2021-04-01 LAB — GLUCOSE, CAPILLARY
Glucose-Capillary: 75 mg/dL (ref 70–99)
Glucose-Capillary: 124 mg/dL — ABNORMAL HIGH (ref 70–99)
Glucose-Capillary: 92 mg/dL (ref 70–99)

## 2021-04-01 LAB — HM COLONOSCOPY

## 2021-04-01 SURGERY — COLONOSCOPY WITH PROPOFOL
Anesthesia: General

## 2021-04-01 MED ORDER — STERILE WATER FOR IRRIGATION IR SOLN
Status: DC | PRN
  Administered 2021-04-01: 200 mL

## 2021-04-01 MED ORDER — LACTATED RINGERS IV SOLN: INTRAVENOUS | Status: DC

## 2021-04-01 MED ORDER — PROPOFOL 10 MG/ML IV BOLUS
INTRAVENOUS | Status: DC | PRN
  Administered 2021-04-01: 80 mg via INTRAVENOUS
  Administered 2021-04-01: 125 ug/kg/min via INTRAVENOUS

## 2021-04-01 MED ORDER — DEXTROSE 50 % IV SOLN
INTRAVENOUS | Status: AC
  Filled 2021-04-01: qty 50

## 2021-04-01 MED ORDER — EPHEDRINE SULFATE 50 MG/ML IJ SOLN
INTRAMUSCULAR | Status: DC | PRN
  Administered 2021-04-01 (×2): 10 mg via INTRAVENOUS

## 2021-04-01 MED ORDER — DEXTROSE 50 % IV SOLN
12.5000 g | Freq: Once | INTRAVENOUS | Status: AC
  Administered 2021-04-01: 12.5 g via INTRAVENOUS

## 2021-04-01 NOTE — Discharge Instructions (Addendum)
You are being discharged to home.  °Resume your previous diet.  °We are waiting for your pathology results.  °Your physician has recommended a repeat colonoscopy in two years for surveillance.  °

## 2021-04-01 NOTE — Progress Notes (Signed)
Assisted pt to car ambulatory at discharge. As pt opened the car door she sat down on curb stating "My right leg gave out on me.  Ever since my back surgery it will do that sometimes."  Pt denies pain or injury.  Pt's spouse at car and witnessed.  Informed Dr. Jenetta Downer, no other concerns.

## 2021-04-01 NOTE — Anesthesia Postprocedure Evaluation (Signed)
Anesthesia Post Note  Patient: Annette [name]  Procedure(s) Performed: COLONOSCOPY WITH PROPOFOL POLYPECTOMY INTESTINAL HEMOSTASIS CLIP PLACEMENT  Patient location during evaluation: Phase II Anesthesia Type: General Level of consciousness: awake Pain management: pain level controlled Vital Signs Assessment: post-procedure vital signs reviewed and stable Respiratory status: spontaneous breathing and respiratory function stable Cardiovascular status: blood pressure returned to baseline and stable Postop Assessment: no headache and no apparent nausea or vomiting Anesthetic complications: no Comments: Late entry   No notable events documented.   Last Vitals:  Vitals:   04/01/21 0900 04/01/21 0912  BP: (!) 83/41 (!) 84/52  Pulse:    Resp: 20   Temp:    SpO2: 96%     Last Pain:  Vitals:   04/01/21 0900  TempSrc:   PainSc: 0-No pain                 Louann Sjogren

## 2021-04-01 NOTE — Anesthesia Preprocedure Evaluation (Signed)
Anesthesia Evaluation  Patient identified by MRN, date of birth, ID band Patient awake    Reviewed: Allergy & Precautions, H&P , NPO status , Patient's Chart, lab work & pertinent test results, reviewed documented beta blocker date and time   Airway Mallampati: II  TM Distance: >3 FB Neck ROM: full    Dental no notable dental hx.    Pulmonary sleep apnea , Current Smoker,    Pulmonary exam normal breath sounds clear to auscultation       Cardiovascular Exercise Tolerance: Good hypertension,  Rhythm:regular Rate:Normal     Neuro/Psych  Headaches, PSYCHIATRIC DISORDERS Anxiety Depression    GI/Hepatic Neg liver ROS, GERD  Medicated,  Endo/Other  diabetes, Type 2Hypothyroidism   Renal/GU negative Renal ROS  negative genitourinary   Musculoskeletal   Abdominal   Peds  Hematology  (+) Blood dyscrasia, anemia ,   Anesthesia Other Findings Relative hypoglycemia this morning - treat with D50.  Reproductive/Obstetrics negative OB ROS                             Anesthesia Physical Anesthesia Plan  ASA: 3  Anesthesia Plan: General   Post-op Pain Management:    Induction:   PONV Risk Score and Plan: Propofol infusion  Airway Management Planned:   Additional Equipment:   Intra-op Plan:   Post-operative Plan:   Informed Consent: I have reviewed the patients History and Physical, chart, labs and discussed the procedure including the risks, benefits and alternatives for the proposed anesthesia with the patient or authorized representative who has indicated his/her understanding and acceptance.     Dental Advisory Given  Plan Discussed with: CRNA  Anesthesia Plan Comments:         Anesthesia Quick Evaluation

## 2021-04-01 NOTE — Op Note (Addendum)
[facility] Patient Name: Annette [name] Procedure Date: 04/01/2021 8:16 AM MRN: [id number] Date of Birth: 1960-12-04 Attending MD: [name] ,  CSN: [id number] Age: 60 Admit Type: Outpatient Procedure:                Colonoscopy Indications:              Surveillance: Piecemeal removal of large sessile                            adenoma last colonoscopy (< 3 yrs) Providers:                [name], [place] Page, Pa[place] Risa Grill, Technician Referring MD:              Medicines:                Monitored Anesthesia Care Complications:            No immediate complications. Estimated Blood Loss:     Estimated blood loss: none. Procedure:                Pre-Anesthesia Assessment:                           - Prior to the procedure, a History and Physical                            was performed, and patient medications, allergies                            and sensitivities were reviewed. The patient's                            tolerance of previous anesthesia was reviewed.                           - The risks and benefits of the procedure and the                            sedation options and risks were discussed with the                            patient. All questions were answered and informed                            consent was obtained.                           - After reviewing the risks and benefits, the                            patient was deemed in satisfactory condition to                            undergo the procedure.                           -  ASA Grade Assessment: II - A patient with mild                            systemic disease.                           After obtaining informed consent, the colonoscope                            was passed under direct vision. Throughout the                            procedure, the patient's blood pressure, pulse, and                            oxygen saturations  were monitored continuously. The                            [id number]-[id number] ([id number]) scope was introduced through                            the anus and advanced to the the cecum, identified                            by appendiceal orifice and ileocecal valve. The                            colonoscopy was performed without difficulty. The                            patient tolerated the procedure well. Scope In: 8:27:52 AM Scope Out: 8:57:23 AM Scope Withdrawal Time: 0 hours 24 minutes 30 seconds  Total Procedure Duration: 0 hours 29 minutes 31 seconds  Findings:      The perianal and digital rectal examinations were normal.      A 9 mm polyp was found in the cecum. The polyp was sessile. The polyp       was located at the site of previous polypectomy, possibly a residual       polyp. The polyp was removed with a cold snare. Resection and retrieval       were complete. To prevent bleeding after the polypectomy, two hemostatic       clips were successfully placed. There was no bleeding at the end of the       procedure.      Two sessile polyps were found in the ascending colon. The polyps were 3       to 5 mm in size. These polyps were removed with a cold snare. Resection       were complete for both bpolyps but only one was retrieved.      The retroflexed view of the distal rectum and anal verge was normal and       showed no anal or rectal abnormalities. Impression:               - One 9 mm polyp in the cecum, removed with a cold  snare. Resected and retrieved. Clips were placed.                           - Two 3 to 5 mm polyps in the ascending colon,                            removed with a cold snare. Resected and retrieved.                           - The distal rectum and anal verge are normal on                            retroflexion view. Moderate Sedation:      Per Anesthesia Care Recommendation:           - Discharge patient to home (ambulatory).                            - Resume previous diet.                           - Await pathology results.                           - Repeat colonoscopy in 2 years for surveillance. Procedure Code(s):        --- Professional ---                           [phone], Colonoscopy, flexible; with removal of                            tumor(s), polyp(s), or other lesion(s) by snare                            technique Diagnosis Code(s):        --- Professional ---                           K63.5, Polyp of colon                           Z86.010, Personal history of colonic polyps CPT copyright 2019 American [employer] Association. All rights reserved. The codes documented in this report are preliminary and upon coder review may  be revised to meet current compliance requirements. [name], MD [name],  04/01/2021 9:10:44 AM This report has been signed electronically. Number of Addenda: 0

## 2021-04-01 NOTE — Transfer of Care (Signed)
Immediate Anesthesia Transfer of Care Note  Patient: Annette [name]  Procedure(s) Performed: COLONOSCOPY WITH PROPOFOL POLYPECTOMY INTESTINAL HEMOSTASIS CLIP PLACEMENT  Patient Location: Endoscopy Unit  Anesthesia Type:General  Level of Consciousness: awake, alert , patient cooperative and responds to stimulation  Airway & Oxygen Therapy: Patient Spontanous Breathing  Post-op Assessment: Report given to RN, Post -op Vital signs reviewed and stable and Patient moving all extremities  Post vital signs: Reviewed and stable  Last Vitals:  Vitals Value Taken Time  BP    Temp    Pulse    Resp    SpO2      Last Pain:  Vitals:   04/01/21 0822  TempSrc:   PainSc: 0-No pain      Patients Stated Pain Goal: 8 (64/38/37 7939)  Complications: No notable events documented.

## 2021-04-01 NOTE — H&P (Signed)
Annette [name] is an 60 y.o. female.   Chief Complaint: History of colon polyps s/p piecemeal resection HPI: Annette [name] is a 60 y.o. female  with past medical history of depression, diabetes, GERD, hyperlipidemia, hypothyroidism, IBS -D, obesity, OSA, history of breast cancer on remission, who presents for surveillance colonoscopy after piecemeal resection of cecal polyp.  Patient denies having any complaints.  Only had some diarrhea yesterday after taking the laxative but not anymore.  Denies any nausea, vomiting, fever, chills, abdominal pain or distention.  Last Colonoscopy: 2021 - One 15 mm polyp in the cecum, removed with a cold snare and removed with a hot snare. Resected and retrieved. Injected.  This was sessile serrated polyp. - Two 10 to 12 mm polyps in the rectum and in the descending colon, removed with a cold snare. Resected and retrieved.  Tubular adenomas. - The rest entire examined colon is normal. Biopsied, negative for microscopic colitis - The distal rectum and anal verge are normal on retroflexion view.  Past Medical History:  Diagnosis Date   Arthritis    Phreesia 10/14/2020   Blood transfusion without reported diagnosis    Phreesia 10/14/2020   Breast cancer ([place]) 2007   History of  XRT, onTamoxifen   Bruises easily    Cancer ([place])    Phreesia 10/14/2020   Depression    Depression    Phreesia 10/14/2020   Diabetes mellitus    prediabetic   Diabetes mellitus without complication ([place])    Phreesia 10/14/2020   GERD (gastroesophageal reflux disease)    Hypertriglyceridemia    Hypothyroidism    following chemo amnd radiaition for breast cancer, needed replacement short term   Hypothyroidism (acquired)    replaced x 1 year   IBS (irritable bowel syndrome)    Menieres disease    Controlled with triamterene    Migraines    Obesity    Palpitations    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[place])    Sleep apnea 2010   Problems with CPAP    Past Surgical History:   Procedure Laterality Date   ABDOMINAL EXPOSURE N/A 12/03/2020   Procedure: ABDOMINAL EXPOSURE;  Surgeon: Rosetta Posner, MD;  Location: [place];  Service: Vascular;  Laterality: N/A;   ABDOMINAL HYSTERECTOMY  1998   Benign, fibroids   ADRENALECTOMY  02/16/2012   [facility], splemic trauma, 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   ANTERIOR LUMBAR FUSION N/A 12/03/2020   Procedure: Lumbar Four-Five Lumbar Five Sacral One Anterior lumbar interbody fusion;  Surgeon: Ashok Pall, MD;  Location: [place];  Service: Neurosurgery;  Laterality: N/A;   APPENDECTOMY  06/12/08   BIOPSY  07/02/2020   Procedure: BIOPSY;  Surgeon: Harvel Quale, MD;  [facility];  Service: Gastroenterology;;   BIOPSY  01/22/2021   Procedure: BIOPSY;  Surgeon: Daneil Dolin, MD;  [facility];  Service: Endoscopy;;   BREAST LUMPECTOMY Left 2008   BREAST RECONSTRUCTION     Left reconstructive   BREAST SURGERY  2007   Left lumpectomy   BREAST SURGERY     Mammosite - right side   CHOLECYSTECTOMY  1985   COLONOSCOPY WITH PROPOFOL N/A 07/02/2020   Procedure: COLONOSCOPY WITH PROPOFOL;  Surgeon: Harvel Quale, MD;  [facility];  Service: Gastroenterology;  Laterality: N/A;  1045   ESOPHAGEAL DILATION  12/19/2015   Procedure: ESOPHAGEAL DILATION;  Surgeon: Rogene Houston, MD;  [facility];  Service: Endoscopy;;   ESOPHAGOGASTRODUODENOSCOPY N/A 12/19/2015   Procedure:  ESOPHAGOGASTRODUODENOSCOPY (EGD);  Surgeon: Rogene Houston, MD;  [facility];  Service: Endoscopy;  Laterality: N/A;  11:40   FLEXIBLE SIGMOIDOSCOPY N/A 01/22/2021   Procedure: FLEXIBLE SIGMOIDOSCOPY;  Surgeon: Daneil Dolin, MD;  [facility];  Service: Endoscopy;  Laterality: N/A;   POLYPECTOMY  07/02/2020   Procedure: POLYPECTOMY;  Surgeon: Harvel Quale, MD;  [facility];  Service: Gastroenterology;;   SPLENECTOMY, 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 She has a 20.00 pack-year smoking history. She has never used smokeless tobacco. She reports that she does not drink alcohol and does not use drugs.  Allergies:  Allergies  Allergen Reactions   Penicillins Swelling    Has patient had a PCN reaction causing immediate rash, facial/tongue/throat swelling, SOB or lightheadedness with hypotension: Yes Has patient had a PCN reaction causing severe rash involving mucus membranes or skin necrosis: No Has patient had a PCN reaction that required hospitalization: No  Has patient had a PCN reaction occurring within the last 10 years: No If all of the above answers are "NO", then may proceed with Cephalosporin use.   Of face and tongue.   Metformin And Related Diarrhea and Nausea Only    Pt to discontinue med due to intolerance, cramps and diarrhea   Wound Dressing Adhesive Itching and Rash    Dermabond and other surgical glues for incisions   Empagliflozin Other (See Comments)    Yeast infection  Jardiance   Statins Other (See Comments)    Fell bad but on Crestor   Zetia [Ezetimibe] Other (See Comments)    Feels funny    Medications Prior to Admission  Medication Sig Dispense Refill   aspirin 81 MG EC tablet Take 81 mg by mouth in the morning.     cyclobenzaprine (FLEXERIL) 10 MG tablet Take 1 tablet (10 mg total) by mouth 3 (three) times daily as needed for muscle spasms. (Patient taking differently: Take 10 mg by mouth at bedtime.) 30 tablet 0   Dulaglutide (TRULICITY) 1.5 ID/7.8EU SOPN Inject 0.5 mLs (1.5 mg total)  into the skin once a week. (Patient taking differently: Inject 1.5 mg into the skin every Tuesday.) 4 pen 11   gabapentin (NEURONTIN) 300 MG capsule Take 1 capsule (300 mg total) by mouth 3 (three) times daily. 270 capsule 1   insulin degludec (TRESIBA FLEXTOUCH) 200 UNIT/ML FlexTouch Pen Inject 50 Units into the skin at bedtime. (Patient taking differently: Inject 48 Units into the skin at bedtime.) 18 mL 1   LORazepam (ATIVAN) 1 MG tablet Take 1 mg by mouth 2 (two) times daily as needed (Nausea).     Metoprolol Tartrate 37.5 MG TABS Take 37.5 mg by mouth 2 (two) times daily.     naproxen (NAPROSYN) 500 MG tablet TAKE 1 TABLET(500 MG) BY MOUTH TWICE DAILY WITH A MEAL (Patient taking differently: Take 500 mg by mouth daily as needed for headache.) 40 tablet 0  omeprazole (PRILOSEC) 40 MG capsule Take 1 capsule (40 mg total) by mouth daily. (Patient taking differently: Take 40 mg by mouth in the morning.) 30 capsule 5   polyethylene glycol-electrolytes (TRILYTE) 420 g solution Take 4,000 mLs by mouth as directed. 4000 mL 0   rifaximin (XIFAXAN) 550 MG TABS tablet Take 550 mg by mouth 3 (three) times daily.     rosuvastatin (CRESTOR) 20 MG tablet Take 1 tablet (20 mg total) by mouth daily. (Patient taking differently: Take 20 mg by mouth in the morning.) 90 tablet 1   temazepam (RESTORIL) 15 MG capsule Take 1 capsule (15 mg total) by mouth at bedtime. 30 capsule 5   traZODone (DESYREL) 100 MG tablet TAKE 1 TABLET(100 MG) BY MOUTH AT BEDTIME (Patient taking differently: Take 100 mg by mouth at bedtime.) 90 tablet 1   triamterene-hydrochlorothiazide (DYAZIDE) 37.5-25 MG capsule Take 1 capsule by mouth in the morning.     venlafaxine XR (EFFEXOR-XR) 75 MG 24 hr capsule Take 1 capsule (75 mg total) by mouth daily with breakfast. TAKE 1 CAPSULE(75 MG) BY MOUTH DAILY (Patient taking differently: Take 75 mg by mouth in the morning.) 90 capsule 0   Vitamin D, Ergocalciferol, (DRISDOL) 1.25 MG (50000 UNIT) CAPS  capsule TAKE 1 CAPSULE BY MOUTH ONCE A WEEK (Patient taking differently: Take 50,000 Units by mouth every Tuesday.) 12 capsule 0   CLOTRIMAZOLE EX Apply 1 application topically daily as needed (itching an breast).     glucose blood test strip See admin instructions.     Insulin Pen Needle (PEN NEEDLES) 31G X 6 MM MISC See admin instructions.     Lancets (ONETOUCH DELICA PLUS [id number]) MISC Use 4 lancets per day to check blood sugar 200 each 3   Melatonin 10 MG TABS Take 10 mg by mouth at bedtime as needed (sleep).     ONETOUCH VERIO test strip USE AS DIRECTED FOUR TIMES DAILY 150 strip 5   oxyCODONE (OXY IR/ROXICODONE) 5 MG immediate release tablet Take 5 mg by mouth every 6 (six) hours as needed for pain.      Results for orders placed or performed during the hospital encounter of 04/01/21 (from the past 48 hour(s))  Glucose, capillary     Status: None   Collection Time: 04/01/21  7:15 AM  Result Value Ref Range   Glucose-Capillary 75 70 - 99 mg/dL    Comment: Glucose reference range applies only to samples taken after fasting for at least 8 hours.   No results found.  Review of Systems  Constitutional: Negative.   HENT: Negative.    Eyes: Negative.   Respiratory: Negative.    Cardiovascular: Negative.   Gastrointestinal: Negative.   Endocrine: Negative.   Genitourinary: Negative.   Musculoskeletal: Negative.   Skin: Negative.   Allergic/Immunologic: Negative.   Neurological: Negative.   Hematological: Negative.   Psychiatric/Behavioral: Negative.     Blood pressure (!) 109/59, pulse 77, temperature 98.6 F (37 C), temperature source Oral, resp. rate 14, height 5\' 5"  (1.651 m), weight 87.5 kg, SpO2 98 %. Physical Exam  GENERAL: The patient is AO x3, in no acute distress. HEENT: Head is normocephalic and atraumatic. EOMI are intact. Mouth is well hydrated and without lesions. NECK: Supple. No masses LUNGS: Clear to auscultation. No presence of rhonchi/wheezing/rales.  Adequate chest expansion HEART: RRR, normal s1 and s2. ABDOMEN: Soft, nontender, no guarding, no peritoneal signs, and nondistended. BS +. No masses. EXTREMITIES: Without any cyanosis, clubbing, rash, lesions or edema. NEUROLOGIC:  AOx3, no focal motor deficit. SKIN: no jaundice, no rashes  Assessment/Plan VOLANDA MANGINE is a 60 y.o. female  with past medical history of depression, diabetes, GERD, hyperlipidemia, hypothyroidism, IBS -D, obesity, OSA, history of breast cancer on remission, who presents for surveillance colonoscopy after piecemeal resection of cecal polyp.  We will proceed with colonoscopy.  Harvel Quale, MD 04/01/2021, 7:32 AM

## 2021-04-02 ENCOUNTER — Telehealth (INDEPENDENT_AMBULATORY_CARE_PROVIDER_SITE_OTHER): Payer: Self-pay | Admitting: Gastroenterology

## 2021-04-02 LAB — SURGICAL PATHOLOGY

## 2021-04-02 NOTE — Telephone Encounter (Signed)
I tried reaching the patient to discuss results of most recent colonoscopy but could not talk to her, and left a voice message. Upon review of her record, she had presence of lymphocytes in random colonic biopsies performed during most recent flexible sigmoidoscopy.  It is unclear if she has lymphocytic colitis as her diarrhea symptoms have resolved.  May need to consider repeating random colonic biopsies if she has recurrent diarrhea.

## 2021-04-03 ENCOUNTER — Encounter (INDEPENDENT_AMBULATORY_CARE_PROVIDER_SITE_OTHER): Payer: Self-pay | Admitting: *Deleted

## 2021-04-08 ENCOUNTER — Encounter (HOSPITAL_COMMUNITY): Payer: Self-pay | Admitting: Gastroenterology

## 2021-04-13 ENCOUNTER — Other Ambulatory Visit: Payer: Self-pay

## 2021-04-13 ENCOUNTER — Telehealth: Payer: Self-pay | Admitting: Family Medicine

## 2021-04-13 DIAGNOSIS — E7800 Pure hypercholesterolemia, unspecified: Secondary | ICD-10-CM

## 2021-04-13 MED ORDER — ROSUVASTATIN CALCIUM 20 MG PO TABS: 20.0000 mg | ORAL_TABLET | Freq: Every day | ORAL | 1 refills | Status: DC

## 2021-04-13 NOTE — Telephone Encounter (Signed)
Rx sent 

## 2021-04-13 NOTE — Telephone Encounter (Signed)
Please send Crestor refill --send to [facility] [address] --thanks

## 2021-04-17 ENCOUNTER — Other Ambulatory Visit: Payer: Self-pay

## 2021-04-17 ENCOUNTER — Ambulatory Visit (HOSPITAL_COMMUNITY)
Admission: RE | Admit: 2021-04-17 | Discharge: 2021-04-17 | Disposition: A | Discharge status: Home / Self Care | Payer: BC Managed Care – PPO | Source: Ambulatory Visit | Attending: Hematology | Admitting: Hematology

## 2021-04-17 DIAGNOSIS — Z87891 Personal history of nicotine dependence: Secondary | ICD-10-CM | POA: Insufficient documentation

## 2021-04-17 DIAGNOSIS — Z122 Encounter for screening for malignant neoplasm of respiratory organs: Secondary | ICD-10-CM | POA: Insufficient documentation

## 2021-04-21 ENCOUNTER — Other Ambulatory Visit: Payer: Self-pay | Admitting: Internal Medicine

## 2021-04-21 ENCOUNTER — Telehealth: Payer: Self-pay | Admitting: Family Medicine

## 2021-04-21 ENCOUNTER — Other Ambulatory Visit: Payer: Self-pay

## 2021-04-21 ENCOUNTER — Other Ambulatory Visit: Payer: Self-pay | Admitting: Family Medicine

## 2021-04-21 MED ORDER — METOPROLOL TARTRATE 37.5 MG PO TABS: 37.5000 mg | ORAL_TABLET | Freq: Two times a day (BID) | ORAL | 5 refills | Status: DC

## 2021-04-21 MED ORDER — TEMAZEPAM 15 MG PO CAPS: 15.0000 mg | ORAL_CAPSULE | Freq: Every evening | ORAL | 5 refills | Status: DC | PRN

## 2021-04-21 NOTE — Telephone Encounter (Signed)
Please send in Metoporal and temazapam, [facility] [address]

## 2021-04-21 NOTE — Telephone Encounter (Signed)
Wants refill of temazepam sent to [facility] [place]

## 2021-04-23 ENCOUNTER — Other Ambulatory Visit: Payer: Self-pay

## 2021-04-23 ENCOUNTER — Encounter: Payer: Self-pay | Admitting: Family Medicine

## 2021-04-23 ENCOUNTER — Ambulatory Visit (INDEPENDENT_AMBULATORY_CARE_PROVIDER_SITE_OTHER): Payer: BC Managed Care – PPO | Admitting: Family Medicine

## 2021-04-23 VITALS — BP 79/33 | HR 68 | Temp 98.8°F | Resp 20 | Ht 66.0 in | Wt 194.0 lb

## 2021-04-23 DIAGNOSIS — I959 Hypotension, unspecified: Secondary | ICD-10-CM

## 2021-04-23 DIAGNOSIS — F17208 Nicotine dependence, unspecified, with other nicotine-induced disorders: Secondary | ICD-10-CM

## 2021-04-23 DIAGNOSIS — E7800 Pure hypercholesterolemia, unspecified: Secondary | ICD-10-CM | POA: Diagnosis not present

## 2021-04-23 DIAGNOSIS — Z794 Long term (current) use of insulin: Secondary | ICD-10-CM | POA: Diagnosis not present

## 2021-04-23 DIAGNOSIS — E785 Hyperlipidemia, unspecified: Secondary | ICD-10-CM

## 2021-04-23 DIAGNOSIS — Z23 Encounter for immunization: Secondary | ICD-10-CM

## 2021-04-23 DIAGNOSIS — E669 Obesity, unspecified: Secondary | ICD-10-CM

## 2021-04-23 DIAGNOSIS — F5104 Psychophysiologic insomnia: Secondary | ICD-10-CM | POA: Diagnosis not present

## 2021-04-23 DIAGNOSIS — E1169 Type 2 diabetes mellitus with other specified complication: Secondary | ICD-10-CM

## 2021-04-23 MED ORDER — TRAZODONE HCL 100 MG PO TABS: ORAL_TABLET | ORAL | 1 refills | Status: DC

## 2021-04-23 MED ORDER — GABAPENTIN 300 MG PO CAPS: 300.0000 mg | ORAL_CAPSULE | Freq: Three times a day (TID) | ORAL | 1 refills | Status: DC

## 2021-04-23 MED ORDER — ROSUVASTATIN CALCIUM 20 MG PO TABS
20.0000 mg | ORAL_TABLET | Freq: Every day | ORAL | 1 refills | Status: DC
Start: 2021-04-23 — End: 2021-10-14

## 2021-04-23 MED ORDER — METOPROLOL TARTRATE 37.5 MG PO TABS: 37.5000 mg | ORAL_TABLET | Freq: Two times a day (BID) | ORAL | 1 refills | Status: DC

## 2021-04-23 MED ORDER — OMEPRAZOLE 40 MG PO CPDR
40.0000 mg | DELAYED_RELEASE_CAPSULE | Freq: Every day | ORAL | 1 refills | Status: DC
Start: 2021-04-23 — End: 2021-09-18

## 2021-04-23 MED ORDER — VENLAFAXINE HCL ER 75 MG PO CP24: 75.0000 mg | ORAL_CAPSULE | Freq: Every day | ORAL | 0 refills | Status: DC

## 2021-04-23 MED ORDER — TRIAMTERENE-HCTZ 37.5-25 MG PO CAPS: 1.0000 | ORAL_CAPSULE | Freq: Every morning | ORAL | 1 refills | Status: DC

## 2021-04-23 MED ORDER — VITAMIN D (ERGOCALCIFEROL) 1.25 MG (50000 UNIT) PO CAPS: 50000.0000 [IU] | ORAL_CAPSULE | ORAL | 0 refills | Status: DC

## 2021-04-23 NOTE — Patient Instructions (Addendum)
Annual exam in 4 months, call if you need me sooner, shingrix #2 at visit  Shingrix #1 today  Nurse please send  all scripts needed ( except restoril) to her new mail order, [facility]  I will refer you re abn chest scan Work on quitting    Microalb today   Fasting lipid, cmp and EGFr in September  Thanks for choosing [facility], we consider it a privelige to serve you.

## 2021-04-25 LAB — MICROALBUMIN / CREATININE URINE RATIO
Creatinine, Urine: 35.3 mg/dL
Microalb/Creat Ratio: <8 mg/g creat (ref 0–29)
Microalbumin, Urine: <3 ug/mL

## 2021-04-26 ENCOUNTER — Encounter: Payer: Self-pay | Admitting: Family Medicine

## 2021-04-26 NOTE — Assessment & Plan Note (Signed)
  Patient re-educated about  the importance of commitment to a  minimum of 150 minutes of exercise per week as able.  The importance of healthy food choices with portion control discussed, as well as eating regularly and within a 12 hour window most days. The need to choose "clean , green" food 50 to 75% of the time is discussed, as well as to make water the primary drink and set a goal of 64 ounces water daily.    Weight /BMI 04/23/2021 04/01/2021 03/04/2021  WEIGHT 194 lb 193 lb 193 lb 6.4 oz  HEIGHT '5\' 6"'$  '5\' 5"'$  5' 5.5"  BMI 31.31 kg/m2 32.12 kg/m2 31.69 kg/m2

## 2021-04-26 NOTE — Assessment & Plan Note (Signed)
Improved, she is applauded on this  Patient re-educated about  the importance of commitment to a  minimum of 150 minutes of exercise per week as able.  The importance of healthy food choices with portion control discussed, as well as eating regularly and within a 12 hour window most days. The need to choose "clean , green" food 50 to 75% of the time is discussed, as well as to make water the primary drink and set a goal of 64 ounces water daily.    Weight /BMI 04/23/2021 04/01/2021 03/04/2021  WEIGHT 194 lb 193 lb 193 lb 6.4 oz  HEIGHT '5\' 6"'$  '5\' 5"'$  5' 5.5"  BMI 31.31 kg/m2 32.12 kg/m2 31.69 kg/m2

## 2021-04-26 NOTE — Assessment & Plan Note (Signed)
Hyperlipidemia:Low fat diet discussed and encouraged.   Lipid Panel  Lab Results  Component Value Date   CHOL 131 10/07/2020   HDL 43 10/07/2020   LDLCALC 66 10/07/2020   TRIG 124 10/07/2020   CHOLHDL 3.0 10/07/2020     Controlled, no change in medication Updated lab needed at/ before next visit.

## 2021-04-26 NOTE — Assessment & Plan Note (Signed)
Sleep hygiene reviewed and written information offered also. Prescription sent for  medication needed.  

## 2021-04-26 NOTE — Progress Notes (Signed)
Annette [name]     MRN: [id number]      DOB: 1961-04-27   HPI Annette [name] is here for follow up and re-evaluation of chronic medical conditions, medication management and review of any available recent lab and radiology data.  Preventive health is updated, specifically  Cancer screening and Immunization.   Questions or concerns regarding consultations or procedures which the PT has had in the interim are  addressed. The PT denies any adverse reactions to current medications since the last visit.  There are no new concerns.  There are no specific complaints   ROS Denies recent fever or chills. Denies sinus pressure, nasal congestion, ear pain or sore throat. Denies chest congestion, productive cough or wheezing. Denies chest pains, palpitations and leg swelling Denies abdominal pain, nausea, vomiting,diarrhea or constipation.   Denies dysuria, frequency, hesitancy or incontinence.  Denies headaches, seizures, numbness, or tingling. Denies depression, anxiety or uncontrolled  insomnia. Denies skin break down or rash.   PE  BP (!) 79/33 (BP Location: Right Arm, Patient Position: Sitting, Cuff Size: Large)   Pulse 68   Temp 98.8 F (37.1 C)   Resp 20   Ht '5\' 6"'$  (1.676 m)   Wt 194 lb (88 kg)   SpO2 94%   BMI 31.31 kg/m   Patient alert and oriented and in no cardiopulmonary distress.  HEENT: No facial asymmetry, EOMI,     Neck supple .  Chest: decreased air entry,clear.  CVS: S1, S2 no murmurs, no S3.Regular rate.  ABD: Soft non tender.   Ext: No edema  MS: Adequate ROM spine, shoulders, hips and knees.  Skin: Intact, no ulcerations or rash noted.  Psych: Good eye contact, normal affect. Memory intact not anxious or depressed appearing.  CNS: CN 2-12 intact, power,  normal throughout.no focal deficits noted.   Assessment & Plan  Hypotension Chronic, has had cardiology evaluation, functions with low pressure, no changes , will efer for card re eval if c/o  lighthehadeness  Hyperlipidemia LDL goal <100 Hyperlipidemia:Low fat diet discussed and encouraged.   Lipid Panel  Lab Results  Component Value Date   CHOL 131 10/07/2020   HDL 43 10/07/2020   LDLCALC 66 10/07/2020   TRIG 124 10/07/2020   CHOLHDL 3.0 10/07/2020     Controlled, no change in medication Updated lab needed at/ before next visit.   Nicotine dependence Asked:confirms currently smokes cigarettes Assess: Unwilling to set a quit date, but is cutting back Advise: needs to QUIT to reduce risk of cancer, cardio and cerebrovascular disease Assist: counseled for 5 minutes and literature provided Arrange: follow up in 2 to 4 months   Type 2 diabetes mellitus with other specified complication ([place]) Managed by Endo and well controlled Annette [name] is reminded of the importance of commitment to daily physical activity for 30 minutes or more, as able and the need to limit carbohydrate intake to 30 to 60 grams per meal to help with blood sugar control.   The need to take medication as prescribed, test blood sugar as directed, and to call between visits if there is a concern that blood sugar is uncontrolled is also discussed.   Annette [name] is reminded of the importance of daily foot exam, annual eye examination, and good blood sugar, blood pressure and cholesterol control.  Diabetic Labs Latest Ref Rng & Units 04/23/2021 03/23/2021 02/05/2021 01/23/2021 01/22/2021  HbA1c 4.8 - 5.6 % - - - - -  Microalbumin Not Estab. ug/mL - - - - -  Micro/Creat Ratio 0 - 29 mg/g creat <8 - - - -  Chol 100 - 199 mg/dL - - - - -  HDL >39 mg/dL - - - - -  Calc LDL 0 - 99 mg/dL - - - - -  Triglycerides 0 - 149 mg/dL - - - - -  Creatinine 0.44 - 1.00 mg/dL - 1.01(H) 0.90 0.78 0.93   BP/Weight 04/23/2021 04/01/2021 03/04/2021 02/26/2021 02/05/2021 01/23/2021 [id number]  Systolic BP 79 84 90 - [id number] -  Diastolic BP 33 52 58 - 46 62 -  Wt. (Lbs) 194 193 193.4 202 202 - 201  BMI 31.31 32.12 31.69 33.1 33.1  - 33.45   Foot/eye exam completion dates 08/23/2019 07/15/2014  Foot Form Completion Done Done        Insomnia Sleep hygiene reviewed and written information offered also. Prescription sent for  medication needed.   Morbid obesity ([place]) Improved, she is applauded on this  Patient re-educated about  the importance of commitment to a  minimum of 150 minutes of exercise per week as able.  The importance of healthy food choices with portion control discussed, as well as eating regularly and within a 12 hour window most days. The need to choose "clean , green" food 50 to 75% of the time is discussed, as well as to make water the primary drink and set a goal of 64 ounces water daily.    Weight /BMI 04/23/2021 04/01/2021 03/04/2021  WEIGHT 194 lb 193 lb 193 lb 6.4 oz  HEIGHT '5\' 6"'$  '5\' 5"'$  5' 5.5"  BMI 31.31 kg/m2 32.12 kg/m2 31.69 kg/m2      Obesity (BMI 30.0-34.9)  Patient re-educated about  the importance of commitment to a  minimum of 150 minutes of exercise per week as able.  The importance of healthy food choices with portion control discussed, as well as eating regularly and within a 12 hour window most days. The need to choose "clean , green" food 50 to 75% of the time is discussed, as well as to make water the primary drink and set a goal of 64 ounces water daily.    Weight /BMI 04/23/2021 04/01/2021 03/04/2021  WEIGHT 194 lb 193 lb 193 lb 6.4 oz  HEIGHT '5\' 6"'$  '5\' 5"'$  5' 5.5"  BMI 31.31 kg/m2 32.12 kg/m2 31.69 kg/m2

## 2021-04-26 NOTE — Assessment & Plan Note (Signed)
Chronic, has had cardiology evaluation, functions with low pressure, no changes , will efer for card re eval if c/o lighthehadeness

## 2021-04-26 NOTE — Assessment & Plan Note (Signed)
Asked:confirms currently smokes cigarettes °Assess: Unwilling to set a quit date, but is cutting back °Advise: needs to QUIT to reduce risk of cancer, cardio and cerebrovascular disease °Assist: counseled for 5 minutes and literature provided °Arrange: follow up in 2 to 4 months ° °

## 2021-04-26 NOTE — Assessment & Plan Note (Signed)
Managed by Endo and well controlled Annette [name] is reminded of the importance of commitment to daily physical activity for 30 minutes or more, as able and the need to limit carbohydrate intake to 30 to 60 grams per meal to help with blood sugar control.   The need to take medication as prescribed, test blood sugar as directed, and to call between visits if there is a concern that blood sugar is uncontrolled is also discussed.   Annette [name] is reminded of the importance of daily foot exam, annual eye examination, and good blood sugar, blood pressure and cholesterol control.  Diabetic Labs Latest Ref Rng & Units 04/23/2021 03/23/2021 02/05/2021 01/23/2021 01/22/2021  HbA1c 4.8 - 5.6 % - - - - -  Microalbumin Not Estab. ug/mL - - - - -  Micro/Creat Ratio 0 - 29 mg/g creat <8 - - - -  Chol 100 - 199 mg/dL - - - - -  HDL >39 mg/dL - - - - -  Calc LDL 0 - 99 mg/dL - - - - -  Triglycerides 0 - 149 mg/dL - - - - -  Creatinine 0.44 - 1.00 mg/dL - 1.01(H) 0.90 0.78 0.93   BP/Weight 04/23/2021 04/01/2021 03/04/2021 02/26/2021 02/05/2021 01/23/2021 [id number]  Systolic BP 79 84 90 - [id number] -  Diastolic BP 33 52 58 - 46 62 -  Wt. (Lbs) 194 193 193.4 202 202 - 201  BMI 31.31 32.12 31.69 33.1 33.1 - 33.45   Foot/eye exam completion dates 08/23/2019 07/15/2014  Foot Form Completion Done Done

## 2021-05-04 ENCOUNTER — Ambulatory Visit (INDEPENDENT_AMBULATORY_CARE_PROVIDER_SITE_OTHER): Payer: BC Managed Care – PPO | Admitting: Gastroenterology

## 2021-05-19 ENCOUNTER — Ambulatory Visit (INDEPENDENT_AMBULATORY_CARE_PROVIDER_SITE_OTHER): Payer: BC Managed Care – PPO | Admitting: Internal Medicine

## 2021-05-19 ENCOUNTER — Other Ambulatory Visit: Payer: Self-pay

## 2021-05-19 ENCOUNTER — Encounter: Payer: Self-pay | Admitting: Internal Medicine

## 2021-05-19 VITALS — BP 120/68 | HR 75 | Ht 66.0 in | Wt 194.8 lb

## 2021-05-19 DIAGNOSIS — E1165 Type 2 diabetes mellitus with hyperglycemia: Secondary | ICD-10-CM | POA: Diagnosis not present

## 2021-05-19 DIAGNOSIS — E782 Mixed hyperlipidemia: Secondary | ICD-10-CM | POA: Diagnosis not present

## 2021-05-19 DIAGNOSIS — Z794 Long term (current) use of insulin: Secondary | ICD-10-CM

## 2021-05-19 DIAGNOSIS — E669 Obesity, unspecified: Secondary | ICD-10-CM

## 2021-05-19 LAB — POCT GLYCOSYLATED HEMOGLOBIN (HGB A1C): Hemoglobin A1C: 5.9 % — AB (ref 4.0–5.6)

## 2021-05-19 MED ORDER — TRESIBA FLEXTOUCH 200 UNIT/ML ~~LOC~~ SOPN: 24.0000 [IU] | PEN_INJECTOR | Freq: Every day | SUBCUTANEOUS | 3 refills | Status: DC

## 2021-05-19 NOTE — Progress Notes (Signed)
Patient ID: Annette [name], female   DOB: 07/24/1961, 60 y.o.   MRN: [id number]   This visit occurred during the SARS-CoV-2 public health emergency.  Safety protocols were in place, including screening questions prior to the visit, additional usage of staff PPE, and extensive cleaning of exam room while observing appropriate contact time as indicated for disinfecting solutions.   HPI: Annette [name] is a 60 y.o.-year-old female, initially referred by her PCP, Dr. Moshe Cipro, returning for follow-up for DM2, dx in 2018, insulin-dependent since 03/2019, uncontrolled, without long-term complications.  She previously saw Dr. Dorris Fetch and Dr. Chalmers Cater but then switched to seeing me.  Last visit 4 months ago.  Interim history: Before last visit, she had back surgery (12/2020).  She developed significant leg pain afterwards and actually presented to the emergency room 3 times because of this before last visit.  She was on opiates at that time.  She was not eating well and lost weight. She is not on pain medications now but feels that the leg pain remains the same. She got a steroid inj earlier 2 weeks ago >> sugars higher. They decreased afterwards after increasing Tresiba. She has nausea and blurry vision, also dizziness and occasional tremors.  Reviewed HbA1c levels: Lab Results  Component Value Date   HGBA1C 6.0 (H) 01/17/2021   HGBA1C 6.4 (H) 12/03/2020   HGBA1C 6.6 (A) 09/29/2020   HGBA1C 7.0 (A) 06/24/2020   HGBA1C 9.4 (A) 03/11/2020   HGBA1C 7.5 (H) 07/25/2019   HGBA1C 8.3 (H) 03/12/2019   HGBA1C 8.4 (H) 11/21/2018   HGBA1C 8.0 (H) 08/08/2018   HGBA1C 6.9 (H) 01/19/2018   Previously on: - Glipizide XL 20 mg before breakfast - Lantus 50 units at bedtime (dose increased 04/08/2020) She could not tolerate Metformin due to GI intolerance - abdominal pain and diarrhea. She had vaginal yeast infections and UTIs from [place]. Previously on Tresiba, but unclear why changed to Lantus.  Currently  on: - Trulicity [id number] >> 1.5 mg weekly - started 04/2020 -tolerated well - Tresiba 50 units at bedtime  >> Toujeo 50 >> 56 units at bedtime >> Tresiba 50 >> 30-46 units daily We stopped glipizide XL 01/2021.  Pt checks her sugars twice a day: - am:  76-126 Tyler Aas), 105-190 (Toujeo) >> 62, 79-141 >> 43, 55-99, 162 (steroid) - 2h after b'fast: n/c - before lunch: n/c >> 120 - 2h after lunch: n/c >> 64 >> n/c - before dinner: 51-119, 141, 144, 237 >> 59-159, 162 >> 60-144 >> 59-152, 162 - 2h after dinner: 243, 254 >> 175, 198 >> n/c >> 133, 143, 177 >> 106-166 - bedtime: n/c >> 94 >> n/c - nighttime: n/c Lowest sugar was 51 >> 58 >> 43 (am); she has hypoglycemia awareness in the 90s.  Highest sugar was 425 ...>> 269 (steroid inj).  Glucometer: One Touch verio  At last visit, she was eating only 1 meal a day, but now she eats ~2. She only drinks unsweetened tea and coffee.  -She has no history of CKD, last BUN/creatinine:  Lab Results  Component Value Date   BUN 12 03/23/2021   BUN 12 02/05/2021   CREATININE 1.01 (H) 03/23/2021   CREATININE 0.90 02/05/2021  Not on ACE inhibitor/ARB.  + HL; last set of lipids: Lab Results  Component Value Date   CHOL 131 10/07/2020   HDL 43 10/07/2020   LDLCALC 66 10/07/2020   TRIG 124 10/07/2020   CHOLHDL 3.0 10/07/2020  On Crestor 20. She was  also on Zetia but she did not feel well on it >> stopped.  - last eye exam was in fall 2020: No DR reportedly.  She sometimes has double vision.  At last visit I recommended to see Dr. Prudencio Burly, but she did not see them yet.  -no numbness but some tingling in her feet.  Pt has FH of DM in mother, brother, sister.  She also has a history of HTN, OSA. She had COVID-19 10/2019. She recovered well afterwards. She also has a history of partial thyroidectomy and also adrenalectomy for benign adrenal nodule by Dr. Celine Ahr.  Reviewed latest TSH: Lab Results  Component Value Date   TSH 1.835 01/16/2021    ROS: + See HPI  I reviewed pt's medications, allergies, PMH, social hx, family hx, and changes were documented in the history of present illness. Otherwise, unchanged from my initial visit note.  Past Medical History:  Diagnosis Date   Arthritis    Phreesia 10/14/2020   Blood transfusion without reported diagnosis    Phreesia 10/14/2020   Breast cancer ([place]) 2007   History of  XRT, onTamoxifen   Bruises easily    Cancer ([place])    Phreesia 10/14/2020   Depression    Depression    Phreesia 10/14/2020   Diabetes mellitus    prediabetic   Diabetes mellitus without complication ([place])    Phreesia 10/14/2020   GERD (gastroesophageal reflux disease)    Hypertriglyceridemia    Hypothyroidism    following chemo amnd radiaition for breast cancer, needed replacement short term   Hypothyroidism (acquired)    replaced x 1 year   IBS (irritable bowel syndrome)    Menieres disease    Controlled with triamterene    Migraines    Obesity    Palpitations    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[place])    Sleep apnea 2010   Problems with CPAP   Past Surgical History:  Procedure Laterality Date   ABDOMINAL EXPOSURE N/A 12/03/2020   Procedure: ABDOMINAL EXPOSURE;  Surgeon: Rosetta Posner, MD;  Location: [facility];  Service: Vascular;  Laterality: N/A;   ABDOMINAL HYSTERECTOMY  1998   Benign, fibroids   ADRENALECTOMY  02/16/2012   [facility], splemic trauma, 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   ANTERIOR LUMBAR FUSION N/A 12/03/2020   Procedure: Lumbar Four-Five Lumbar Five Sacral One Anterior lumbar interbody fusion;  Surgeon: Ashok Pall, MD;  Location: [facility];  Service: Neurosurgery;  Laterality: N/A;   APPENDECTOMY  06/12/08   BIOPSY  07/02/2020   Procedure: BIOPSY;  Surgeon: Harvel Quale, MD;  [facility];  Service: Gastroenterology;;   BIOPSY  01/22/2021   Procedure: BIOPSY;  Surgeon: Daneil Dolin, MD;  [facility];  Service: Endoscopy;;   BREAST LUMPECTOMY Left 2008   BREAST  RECONSTRUCTION     Left reconstructive   BREAST SURGERY  2007   Left lumpectomy   BREAST SURGERY     Mammosite - right side   CHOLECYSTECTOMY  1985   COLONOSCOPY WITH PROPOFOL N/A 07/02/2020   Procedure: COLONOSCOPY WITH PROPOFOL;  Surgeon: Harvel Quale, MD;  [facility];  Service: Gastroenterology;  Laterality: N/A;  1045   COLONOSCOPY WITH PROPOFOL N/A 04/01/2021   Procedure: COLONOSCOPY WITH PROPOFOL;  Surgeon: Harvel Quale, MD;  [facility];  Service: Gastroenterology;  Laterality: N/A;  12:50   ESOPHAGEAL DILATION  12/19/2015   Procedure: ESOPHAGEAL DILATION;  Surgeon: Rogene Houston, MD;  [facility];  Service:  Endoscopy;;   ESOPHAGOGASTRODUODENOSCOPY N/A 12/19/2015   Procedure: ESOPHAGOGASTRODUODENOSCOPY (EGD);  Surgeon: Rogene Houston, MD;  [facility];  Service: Endoscopy;  Laterality: N/A;  11:40   FLEXIBLE SIGMOIDOSCOPY N/A 01/22/2021   Procedure: FLEXIBLE SIGMOIDOSCOPY;  Surgeon: Daneil Dolin, MD;  [facility];  Service: Endoscopy;  Laterality: N/A;   HEMOSTASIS CLIP PLACEMENT  04/01/2021   Procedure: HEMOSTASIS CLIP PLACEMENT;  Surgeon: Harvel Quale, MD;  [facility];  Service: Gastroenterology;;  x2   POLYPECTOMY  07/02/2020   Procedure: POLYPECTOMY;  Surgeon: Montez Morita, Quillian Quince, MD;  [facility];  Service: Gastroenterology;;   POLYPECTOMY  04/01/2021   Procedure: POLYPECTOMY INTESTINAL;  Surgeon: Harvel Quale, MD;  [facility];  Service: Gastroenterology;;   SPLENECTOMY, TOTAL  99991111   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   THYROIDECTOMY, PARTIAL  11/05/2010   Benign disease   Social History   Socioeconomic History   Marital status: Married    Spouse name: Alroy Dust   Number of children: 2   Years of education: Not on file   Highest education level: Not on file  Occupational History   Not on file   Tobacco Use   Smoking status: Every Day    Packs/day: 0.50    Years: 40.00    Pack years: 20.00    Types: Cigarettes   Smokeless tobacco: Never   Tobacco comments:    1/2 pack a day  Vaping Use   Vaping Use: Never used  Substance and Sexual Activity   Alcohol use: No    Alcohol/week: 0.0 standard drinks    Comment: Encouraged to quit smoking. She has tried the nicotone gum and patches but did not help   Drug use: No   Sexual activity: Yes    Birth control/protection: Surgical  Other Topics Concern   Not on file  Social History Narrative   Not on file   Social Determinants of Health   Financial Resource Strain: Medium Risk   Difficulty of Paying Living Expenses: Somewhat hard  Food Insecurity: No Food Insecurity   Worried About Charity fundraiser in the Last Year: Never true   Ran Out of Food in the Last Year: Never true  Transportation Needs: No Transportation Needs   Lack of Transportation (Medical): No   Lack of Transportation (Non-Medical): No  Physical Activity: Inactive   Days of Exercise per Week: 0 days   Minutes of Exercise per Session: 0 min  Stress: Stress Concern Present   Feeling of Stress : To some extent  Social Connections: Moderately Integrated   Frequency of Communication with Friends and Family: Three times a week   Frequency of Social Gatherings with Friends and Family: Three times a week   Attends Religious Services: More than 4 times per year   Active Member of Clubs or Organizations: No   Attends Archivist Meetings: Never   Marital Status: Married  Human resources officer Violence: Not At Risk   Fear of Current or Ex-Partner: No   Emotionally Abused: No   Physically Abused: No   Sexually Abused: No   Current Outpatient Medications on File Prior to Visit  Medication Sig Dispense Refill   aspirin 81 MG EC tablet Take 81 mg by mouth in the morning.     CLOTRIMAZOLE EX Apply 1 application topically daily as needed (itching an breast).      cyclobenzaprine (FLEXERIL) 10 MG tablet Take 1 tablet (10  mg total) by mouth 3 (three) times daily as needed for muscle spasms. (Patient taking differently: Take 10 mg by mouth at bedtime.) 30 tablet 0   gabapentin (NEURONTIN) 300 MG capsule Take 1 capsule (300 mg total) by mouth 3 (three) times daily. 270 capsule 1   glucose blood test strip See admin instructions.     insulin degludec (TRESIBA FLEXTOUCH) 200 UNIT/ML FlexTouch Pen Inject 50 Units into the skin at bedtime. (Patient taking differently: Inject 48 Units into the skin at bedtime.) 18 mL 1   Insulin Pen Needle (PEN NEEDLES) 31G X 6 MM MISC See admin instructions.     Lancets (ONETOUCH DELICA PLUS 123XX123) MISC Use 4 lancets per day to check blood sugar 200 each 3   LORazepam (ATIVAN) 1 MG tablet Take 1 mg by mouth 2 (two) times daily as needed (Nausea).     Melatonin 10 MG TABS Take 10 mg by mouth at bedtime as needed (sleep).     Metoprolol Tartrate 37.5 MG TABS Take 37.5 mg by mouth 2 (two) times daily. 180 tablet 1   naproxen (NAPROSYN) 500 MG tablet TAKE 1 TABLET(500 MG) BY MOUTH TWICE DAILY WITH A MEAL 40 tablet 0   omeprazole (PRILOSEC) 40 MG capsule Take 1 capsule (40 mg total) by mouth daily. 90 capsule 1   ONETOUCH VERIO test strip USE AS DIRECTED FOUR TIMES DAILY 150 strip 5   oxyCODONE (OXY IR/ROXICODONE) 5 MG immediate release tablet Take 5 mg by mouth every 6 (six) hours as needed for pain.     rifaximin (XIFAXAN) 550 MG TABS tablet Take 550 mg by mouth 3 (three) times daily.     rosuvastatin (CRESTOR) 20 MG tablet Take 1 tablet (20 mg total) by mouth daily. 90 tablet 1   temazepam (RESTORIL) 15 MG capsule Take 1 capsule (15 mg total) by mouth at bedtime as needed for sleep. 30 capsule 5   traZODone (DESYREL) 100 MG tablet TAKE 1 TABLET(100 MG) BY MOUTH AT BEDTIME 90 tablet 1   triamterene-hydrochlorothiazide (DYAZIDE) 37.5-25 MG capsule Take 1 each (1 capsule total) by mouth in the morning. 90 capsule 1   TRULICITY  1.5 0000000 SOPN INJECT 1.'5MG'$  SUBCUTANEOUSLY ONCE A WEEK 6 mL 2   venlafaxine XR (EFFEXOR-XR) 75 MG 24 hr capsule Take 1 capsule (75 mg total) by mouth daily with breakfast. TAKE 1 CAPSULE(75 MG) BY MOUTH DAILY 90 capsule 0   Vitamin D, Ergocalciferol, (DRISDOL) 1.25 MG (50000 UNIT) CAPS capsule Take 1 capsule (50,000 Units total) by mouth once a week. 12 capsule 0   No current facility-administered medications on file prior to visit.   Allergies  Allergen Reactions   Penicillins Swelling    Has patient had a PCN reaction causing immediate rash, facial/tongue/throat swelling, SOB or lightheadedness with hypotension: Yes Has patient had a PCN reaction causing severe rash involving mucus membranes or skin necrosis: No Has patient had a PCN reaction that required hospitalization: No  Has patient had a PCN reaction occurring within the last 10 years: No If all of the above answers are "NO", then may proceed with Cephalosporin use.   Of face and tongue.   Metformin And Related Diarrhea and Nausea Only    Pt to discontinue med due to intolerance, cramps and diarrhea   Wound Dressing Adhesive Itching and Rash    Dermabond and other surgical glues for incisions   Empagliflozin Other (See Comments)    Yeast infection  Jardiance   Statins Other (See Comments)  Fell bad but on Crestor   Zetia [Ezetimibe] Other (See Comments)    Feels funny   Family History  Problem Relation Age of Onset   Diabetes Mother    Arrhythmia Mother    Heart disease Father    Hyperlipidemia Father    Arrhythmia Father    Cancer Paternal Grandmother        Breast   Diabetes Sister    Diabetes Brother    Heart disease Brother     PE: BP 120/68 (BP Location: Left Arm, Patient Position: Sitting, Cuff Size: Normal)   Pulse 75   Ht '5\' 6"'$  (1.676 m)   Wt 194 lb 12.8 oz (88.4 kg)   SpO2 96%   BMI 31.44 kg/m  Wt Readings from Last 3 Encounters:  05/19/21 194 lb 12.8 oz (88.4 kg)  04/23/21 194 lb (88 kg)   04/01/21 193 lb (87.5 kg)   Constitutional: overweight, in NAD Eyes: PERRLA, EOMI, no exophthalmos ENT: moist mucous membranes, no thyromegaly, no cervical lymphadenopathy Cardiovascular: RRR, No MRG Respiratory: CTA B Gastrointestinal: abdomen soft, NT, ND, BS+ Musculoskeletal: no deformities, strength intact in all 4 Skin: moist, warm, no rashes Neurological: no tremor with outstretched hands, DTR normal in all 4  ASSESSMENT: 1. DM2, insulin-dependent, uncontrolled, without long-term complications, but with hyperglycemia  No family history of medullary thyroid cancer or personal history of pancreatitis.  2. HL  3.  Obesity class II  PLAN:  1. Patient with longstanding, uncontrolled, type 2 diabetes, on injectable antidiabetic regimen with long-acting insulin and weekly GLP-1 receptor agonist, with significantly improved blood sugars at last visit, with an HbA1c returned at 6.0%.  At that time, sugars were improved to the point of lows, in the 50s and 60s.  We stopped her glipizide at that time. -At today's visit, sugars are mostly low, between 50s and 100, with few exceptions higher than that.  Upon questioning, she is varying the dose of Tresiba greatly, between 30 and 46 units depending on the blood sugars at bedtime.  I advised her that [place] dose should not be modified based on these blood sugars, and due to the persistently low CBGs, I advised her to decrease the dose further to 24 units daily.  I did advise her that if the sugars remain low after this, to decrease the dose further, but to only change the dose every 3 to 5 days.  For now, we will continue the same Trulicity dose. -She did not have an appointment with ophthalmology yet and I again recommended Dr. Prudencio Burly office - I suggested to:  Patient Instructions  Please decrease: - Tresiba to 24 units daily  Continue: - Trulicity 1.5 mg weekly  Please call and schedule an eye appt with Dr. Prudencio Burly: [facility]:  Dr. Sherlyn Lick MD   Address: [address], [place], [place] [address]  [phone]  Please return in 4 months with your sugar log.   - we checked her HbA1c: 5.9% (slightly lower) - advised to check sugars at different times of the day - 1x a day, rotating check times - advised for yearly eye exams >> she is not UTD - return to clinic in 4 months  2. HL -Your latest lipid panel from 10/2020: All fractions at goal Lab Results  Component Value Date   CHOL 131 10/07/2020   HDL 43 10/07/2020   LDLCALC 66 10/07/2020   TRIG 124 10/07/2020   CHOLHDL 3.0 10/07/2020  -Continues Crestor 20 mg  daily without side effects  3.  Obesity class II -She continues on a GLP-1 receptor agonist, which should also help with weight loss -She lost 11 pounds before last visit.  At that time, she was not eating well because of the leg pain -She lost 7 pounds since then  Philemon Kingdom, MD PhD [facility]

## 2021-05-19 NOTE — Patient Instructions (Addendum)
Please decrease: - Tresiba to 24 units daily  Continue: - Trulicity 1.5 mg weekly  Please call and schedule an eye appt with Dr. Prudencio Burly: [facility]:  Dr. Sherlyn Lick MD   Address: [address], [place], [place] [address]  [phone]  Please return in 4 months with your sugar log.

## 2021-05-19 NOTE — Addendum Note (Signed)
Addended by: Lauralyn Primes on: 05/19/2021 05:47 PM   Modules accepted: Orders

## 2021-05-21 ENCOUNTER — Telehealth: Payer: Self-pay

## 2021-05-21 NOTE — Telephone Encounter (Signed)
Patient called need med refill, she is completely out.   temazepam (RESTORIL) 15 MG capsule   Pharmacy  [id number] [place] [address] [id number] Phone:  [phone] Fax:  [phone] DEA #:  [id number]

## 2021-05-21 NOTE — Telephone Encounter (Signed)
Pt requesting refill

## 2021-05-22 ENCOUNTER — Other Ambulatory Visit: Payer: Self-pay | Admitting: Family Medicine

## 2021-05-22 MED ORDER — TEMAZEPAM 15 MG PO CAPS: 15.0000 mg | ORAL_CAPSULE | Freq: Every evening | ORAL | 5 refills | Status: DC | PRN

## 2021-05-25 ENCOUNTER — Ambulatory Visit (INDEPENDENT_AMBULATORY_CARE_PROVIDER_SITE_OTHER): Payer: BC Managed Care – PPO | Admitting: Gastroenterology

## 2021-06-16 ENCOUNTER — Other Ambulatory Visit: Payer: Self-pay

## 2021-06-16 ENCOUNTER — Telehealth: Payer: Self-pay

## 2021-06-16 DIAGNOSIS — G4730 Sleep apnea, unspecified: Secondary | ICD-10-CM

## 2021-06-16 LAB — CMP14+EGFR
ALT: 15 IU/L (ref 0–32)
AST: 20 IU/L (ref 0–40)
Albumin/Globulin Ratio: 1.4 (ref 1.2–2.2)
Albumin: 4.3 g/dL (ref 3.8–4.8)
Alkaline Phosphatase: 85 IU/L (ref 44–121)
BUN/Creatinine Ratio: 11 — ABNORMAL LOW (ref 12–28)
BUN: 11 mg/dL (ref 8–27)
Bilirubin Total: 0.5 mg/dL (ref 0.0–1.2)
CO2: 27 mmol/L (ref 20–29)
Calcium: 9.7 mg/dL (ref 8.7–10.3)
Chloride: 100 mmol/L (ref 96–106)
Creatinine, Ser: 1.03 mg/dL — ABNORMAL HIGH (ref 0.57–1.00)
Globulin, Total: 3 g/dL (ref 1.5–4.5)
Glucose: 90 mg/dL (ref 65–99)
Potassium: 4.7 mmol/L (ref 3.5–5.2)
Sodium: 142 mmol/L (ref 134–144)
Total Protein: 7.3 g/dL (ref 6.0–8.5)
eGFR: 62 mL/min/{1.73_m2} (ref >59)

## 2021-06-16 LAB — LIPID PANEL
Chol/HDL Ratio: 2.8 ratio (ref 0.0–4.4)
Cholesterol, Total: 149 mg/dL (ref 100–199)
HDL: 53 mg/dL (ref >39)
LDL Chol Calc (NIH): 75 mg/dL (ref 0–99)
Triglycerides: 118 mg/dL (ref 0–149)
VLDL Cholesterol Cal: 21 mg/dL (ref 5–40)

## 2021-06-16 MED ORDER — UNABLE TO FIND
0 refills | Status: AC
Start: 2021-06-16 — End: ?

## 2021-06-16 NOTE — Telephone Encounter (Signed)
Rx faxed to [facility].

## 2021-06-16 NOTE — Telephone Encounter (Signed)
Patient need prescription for face mask and headgear and hose for her cpap.  Pharmacy: [facility]

## 2021-07-02 ENCOUNTER — Ambulatory Visit (INDEPENDENT_AMBULATORY_CARE_PROVIDER_SITE_OTHER): Payer: BC Managed Care – PPO | Admitting: Gastroenterology

## 2021-07-21 ENCOUNTER — Encounter (INDEPENDENT_AMBULATORY_CARE_PROVIDER_SITE_OTHER): Payer: Self-pay | Admitting: Gastroenterology

## 2021-07-21 ENCOUNTER — Other Ambulatory Visit: Payer: Self-pay

## 2021-07-21 ENCOUNTER — Ambulatory Visit (INDEPENDENT_AMBULATORY_CARE_PROVIDER_SITE_OTHER): Payer: BC Managed Care – PPO | Admitting: Gastroenterology

## 2021-07-21 VITALS — BP 94/59 | HR 74 | Temp 98.1°F | Ht 66.0 in | Wt 194.2 lb

## 2021-07-21 DIAGNOSIS — K580 Irritable bowel syndrome with diarrhea: Secondary | ICD-10-CM | POA: Diagnosis not present

## 2021-07-21 NOTE — Progress Notes (Signed)
Referring Provider: Fayrene Helper, MD Primary Care Physician:  Fayrene Helper, MD Primary GI Physician: Jenetta Downer  Chief Complaint  Patient presents with   Follow-up    Patient here today for a follow up. She does have some issues with diarrhea and nausea. She has some mid abdominal pain.   HPI:   Annette [name] is a 60 y.o. female with past medical history of depression, diabetes, GERD, HLD, Hypothyroidism, IBS-D, OSA, breast cancer in remission.  Patient presenting today for follow up of diarrhea/IBS-D.  Patient had previous hospital admission April 2022 for severe diarrhea with dehydration, C diff and GI Path panel negative at that time. She underwent flex sig with normal examination of formed stool, celiac testing was also negative. BMs improved thereafter and patient was discharged home with imodium.   Patient had recurrence of Diarrhea at last clinic visit in May 2022 and was sent a 2 week course of Xifaxan 550mg  TID, colonoscopy thereafter with findings as below (unremarkable for etiology of diarrhea). Symptoms again resolved after colonoscopy.  She reports that she is having continued diarrhea again, she states that she will have anywhere from 1-3 BMs per day. She takes imodium sometimes which will help at times Stools range from very soft to watery, she reports that she has had maybe 2 solid stools since her hospitalization in April 2022. She reports that she does not think she ever took the course of Xifaxan, though she is pretty sure she has the medication at home. She does report that she took lomotil in the past with good result.   She endorses lower abdominal pain that usually gets better after defecation, symptoms typically are not related to what she eats, although sometimes coffee can worsen her diarrhea. She reports some fecal incontinence if she does not get to a restroom quickly, however, she denies any nocturnal fecal incontinence. She denies any rectal  bleeding. Reports maybe one episode of possible black stools 3 days ago, though she is not sure. She endorses some nausea at times, denies vomiting. She denies any issues with her appetite or early satiety, or unintentional weight loss.   Patient does report some ongoing episodes of syncope, is supposed to see her cardiologist in November but is trying to get in to see them sooner, as well as be evaluated by her PCP as she continues to also have some low BPs.   NSAID use: aleve Occasionally for headaches Social hx:no etoh, smokes 1/2-1PPD Fam hx:no crc or liver disease  Last Colonoscopy:04/01/21- One 9 mm polyp in the cecum, (sessile serrated adenoma)Clips were placed- Two 3 to 5 mm polyps in the ascending colon(tubular adenoma) removed with a cold snare.- The distal rectum and anal verge are normal on retroflexion view. Last Endoscopy:12/19/15  Recommendations:  Repeat colonoscopy in June 2024 with possible random colonic bx  Past Medical History:  Diagnosis Date   Arthritis    Phreesia 10/14/2020   Blood transfusion without reported diagnosis    Phreesia 10/14/2020   Breast cancer ([place]) 2007   History of  XRT, onTamoxifen   Bruises easily    Cancer ([place])    Phreesia 10/14/2020   Depression    Depression    Phreesia 10/14/2020   Diabetes mellitus    prediabetic   Diabetes mellitus without complication ([place])    Phreesia 10/14/2020   GERD (gastroesophageal reflux disease)    Hypertriglyceridemia    Hypothyroidism    following chemo amnd radiaition for breast cancer, needed replacement  short term   Hypothyroidism (acquired)    replaced x 1 year   IBS (irritable bowel syndrome)    Menieres disease    Controlled with triamterene    Migraines    Obesity    Palpitations    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[place])    Sleep apnea 2010   Problems with CPAP    Past Surgical History:  Procedure Laterality Date   ABDOMINAL EXPOSURE N/A 12/03/2020   Procedure: ABDOMINAL EXPOSURE;  Surgeon:  Rosetta Posner, MD;  Location: [place];  Service: Vascular;  Laterality: N/A;   ABDOMINAL HYSTERECTOMY  1998   Benign, fibroids   ADRENALECTOMY  02/16/2012   [facility], splemic trauma, 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   ANTERIOR LUMBAR FUSION N/A 12/03/2020   Procedure: Lumbar Four-Five Lumbar Five Sacral One Anterior lumbar interbody fusion;  Surgeon: Ashok Pall, MD;  Location: [place];  Service: Neurosurgery;  Laterality: N/A;   APPENDECTOMY  06/12/08   BIOPSY  07/02/2020   Procedure: BIOPSY;  Surgeon: Harvel Quale, MD;  [facility];  Service: Gastroenterology;;   BIOPSY  01/22/2021   Procedure: BIOPSY;  Surgeon: Daneil Dolin, MD;  [facility];  Service: Endoscopy;;   BREAST LUMPECTOMY Left 2008   BREAST RECONSTRUCTION     Left reconstructive   BREAST SURGERY  2007   Left lumpectomy   BREAST SURGERY     Mammosite - right side   CHOLECYSTECTOMY  1985   COLONOSCOPY WITH PROPOFOL N/A 07/02/2020   Procedure: COLONOSCOPY WITH PROPOFOL;  Surgeon: Harvel Quale, MD;  [facility];  Service: Gastroenterology;  Laterality: N/A;  1045   COLONOSCOPY WITH PROPOFOL N/A 04/01/2021   Procedure: COLONOSCOPY WITH PROPOFOL;  Surgeon: Harvel Quale, MD;  [facility];  Service: Gastroenterology;  Laterality: N/A;  12:50   ESOPHAGEAL DILATION  12/19/2015   Procedure: ESOPHAGEAL DILATION;  Surgeon: Rogene Houston, MD;  [facility];  Service: Endoscopy;;   ESOPHAGOGASTRODUODENOSCOPY N/A 12/19/2015   Procedure: ESOPHAGOGASTRODUODENOSCOPY (EGD);  Surgeon: Rogene Houston, MD;  [facility];  Service: Endoscopy;  Laterality: N/A;  11:40   FLEXIBLE SIGMOIDOSCOPY N/A 01/22/2021   Procedure: FLEXIBLE SIGMOIDOSCOPY;  Surgeon: Daneil Dolin, MD;  [facility];  Service: Endoscopy;  Laterality: N/A;   HEMOSTASIS CLIP PLACEMENT  04/01/2021   Procedure: HEMOSTASIS CLIP PLACEMENT;  Surgeon: Harvel Quale,  MD;  [facility];  Service: Gastroenterology;;  x2   POLYPECTOMY  07/02/2020   Procedure: POLYPECTOMY;  Surgeon: Harvel Quale, MD;  [facility];  Service: Gastroenterology;;   POLYPECTOMY  04/01/2021   Procedure: POLYPECTOMY INTESTINAL;  Surgeon: Harvel Quale, MD;  [facility];  Service: Gastroenterology;;   SPLENECTOMY, TOTAL  10/04/7508   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     CLOTRIMAZOLE EX Apply 1 application topically daily as needed (itching an breast).     cyclobenzaprine (FLEXERIL) 10 MG tablet Take 1 tablet (10 mg total) by mouth 3 (three) times daily as needed for muscle spasms. (Patient taking differently: Take 10 mg by mouth at bedtime.) 30 tablet 0   gabapentin (NEURONTIN) 300 MG capsule Take 1 capsule (300 mg total) by mouth 3 (three) times daily. (Patient taking differently:  Take 600 mg by mouth 3 (three) times daily.) 270 capsule 1   glucose blood test strip See admin instructions.     insulin degludec (TRESIBA FLEXTOUCH) 200 UNIT/ML FlexTouch Pen Inject 24 Units into the skin at bedtime. 18 mL 3   Insulin Pen Needle (PEN NEEDLES) 31G X 6 MM MISC See admin instructions.     Lancets (ONETOUCH DELICA PLUS [id number]) MISC Use 4 lancets per day to check blood sugar 200 each 3   LORazepam (ATIVAN) 1 MG tablet Take 1 mg by mouth 2 (two) times daily as needed (Nausea).     Metoprolol Tartrate 37.5 MG TABS Take 37.5 mg by mouth 2 (two) times daily. 180 tablet 1   naproxen (NAPROSYN) 500 MG tablet TAKE 1 TABLET(500 MG) BY MOUTH TWICE DAILY WITH A MEAL 40 tablet 0   omeprazole (PRILOSEC) 40 MG capsule Take 1 capsule (40 mg total) by mouth daily. 90 capsule 1   ONETOUCH VERIO test strip USE AS DIRECTED FOUR TIMES DAILY 150 strip 5    rosuvastatin (CRESTOR) 20 MG tablet Take 1 tablet (20 mg total) by mouth daily. 90 tablet 1   temazepam (RESTORIL) 15 MG capsule Take 1 capsule (15 mg total) by mouth at bedtime as needed for sleep. 30 capsule 5   traZODone (DESYREL) 100 MG tablet TAKE 1 TABLET(100 MG) BY MOUTH AT BEDTIME 90 tablet 1   triamterene-hydrochlorothiazide (DYAZIDE) 37.5-25 MG capsule Take 1 each (1 capsule total) by mouth in the morning. 90 capsule 1   TRULICITY 1.5 AY/3.0ZS SOPN INJECT 1.5MG  SUBCUTANEOUSLY ONCE A WEEK 6 mL 2   UNABLE TO FIND CPAP tubing and supplies, face mask Dx: G47.30 1 each 0   venlafaxine XR (EFFEXOR-XR) 75 MG 24 hr capsule Take 1 capsule (75 mg total) by mouth daily with breakfast. TAKE 1 CAPSULE(75 MG) BY MOUTH DAILY 90 capsule 0   Vitamin D, Ergocalciferol, (DRISDOL) 1.25 MG (50000 UNIT) CAPS capsule Take 1 capsule (50,000 Units total) by mouth once a week. 12 capsule 0   rifaximin (XIFAXAN) 550 MG TABS tablet Take 550 mg by mouth 3 (three) times daily. (Patient not taking: Reported on 07/21/2021)     No current facility-administered medications for this visit.    Allergies as of 07/21/2021 - Review Complete 07/21/2021  Allergen Reaction Noted   Penicillins Swelling    Metformin and related Diarrhea and Nausea Only 03/30/2015   Wound dressing adhesive Itching and Rash 09/09/2020   Empagliflozin Other (See Comments) 02/29/2020   Statins Other (See Comments) 07/15/2014   Zetia [ezetimibe] Other (See Comments) 11/21/2014    Family History  Problem Relation Age of Onset   Diabetes Mother    Arrhythmia Mother    Heart disease Father    Hyperlipidemia Father    Arrhythmia Father    Cancer Paternal Grandmother        Breast   Diabetes Sister    Diabetes Brother    Heart disease Brother     Social History   Socioeconomic History   Marital status: Married    Spouse name: Alroy Dust   Number of children: 2   Years of education: Not on file   Highest education level: Not on file   Occupational History   Not on file  Tobacco Use   Smoking status: Every Day    Packs/day: 0.50    Years: 40.00    Pack years: 20.00    Types: Cigarettes   Smokeless tobacco: Never   Tobacco comments:  1/2 pack a day  Vaping Use   Vaping Use: Never used  Substance and Sexual Activity   Alcohol use: No    Alcohol/week: 0.0 standard drinks    Comment: Encouraged to quit smoking. She has tried the nicotone gum and patches but did not help   Drug use: No   Sexual activity: Yes    Birth control/protection: Surgical  Other Topics Concern   Not on file  Social History Narrative   Not on file   Social Determinants of Health   Financial Resource Strain: Medium Risk   Difficulty of Paying Living Expenses: Somewhat hard  Food Insecurity: No Food Insecurity   Worried About Charity fundraiser in the Last Year: Never true   Ran Out of Food in the Last Year: Never true  Transportation Needs: No Transportation Needs   Lack of Transportation (Medical): No   Lack of Transportation (Non-Medical): No  Physical Activity: Inactive   Days of Exercise per Week: 0 days   Minutes of Exercise per Session: 0 min  Stress: Stress Concern Present   Feeling of Stress : To some extent  Social Connections: Moderately Integrated   Frequency of Communication with Friends and Family: Three times a week   Frequency of Social Gatherings with Friends and Family: Three times a week   Attends Religious Services: More than 4 times per year   Active Member of Clubs or Organizations: No   Attends Archivist Meetings: Never   Marital Status: Married    Review of systems General: negative for malaise, night sweats, fever, chills, weight los Neck: Negative for lumps, goiter, pain and significant neck swelling Resp: Negative for cough, wheezing, dyspnea at rest CV: Negative for chest pain, leg swelling, palpitations, orthopnea +syncope GI: denies melena, hematochezia, nausea, vomiting,  constipation, dysphagia, odyonophagia, early satiety or unintentional weight loss. +diarrhea MSK: Negative for joint pain or swelling, back pain, and muscle pain. Derm: Negative for itching or rash Psych: Denies depression, anxiety, memory loss, confusion. No homicidal or suicidal ideation.  Heme: Negative for prolonged bleeding, bruising easily, and swollen nodes. Endocrine: Negative for cold or heat intolerance, polyuria, polydipsia and goiter. Neuro: negative for tremor, gait imbalance, syncope and seizures. The remainder of the review of systems is noncontributory.  Physical Exam: BP (!) 94/59 (BP Location: Right Arm, Patient Position: Sitting, Cuff Size: Small)   Pulse 74   Temp 98.1 F (36.7 C) (Oral)   Ht 5\' 6"  (1.676 m)   Wt 194 lb 3.2 oz (88.1 kg)   BMI 31.34 kg/m  General:   Alert and oriented. No distress noted. Pleasant and cooperative.  Head:  Normocephalic and atraumatic. Eyes:  Conjuctiva clear without scleral icterus. Mouth:  Oral mucosa pink and moist. Good dentition. No lesions. Heart: Normal rate and rhythm, s1 and s2 heart sounds present.  Lungs: Clear lung sounds in all lobes. Respirations equal and unlabored. Abdomen:  +BS, soft, and non-distended. No rebound or guarding. No HSM or masses noted. Generalized TTP without rebound or guarding Derm: No palmar erythema or jaundice Msk:  Symmetrical without gross deformities. Normal posture. Extremities:  Without edema. Neurologic:  Alert and  oriented x4 Psych:  Alert and cooperative. Normal mood and affect.  Invalid input(s): 6 MONTHS   ASSESSMENT: Annette [name] is a 60 y.o. female presenting today for follow up of diarrhea.   She reports that she continues to have anywhere from 1-3 loose to watery BMs per day with lower abdominal pain, usually  relieved after defecation. She was sent Xifaxan in June, however, patient is pretty sure she never completed the 2 week course of medication. She will let me know once  she gets home if she still has it, if she did not complete it, we will proceed with 2 week xifaxan course for her diarrhea, suspected likely related to IBS-D. Patient inquired about lomotil as she took this in the past with good results, she takes Imodium on occasion which sometimes helps. We will complete xifaxan course and reevaluate her diarrhea thereafter.   Patient also reports some ongoing syncopal episodes, BP soft in the office today, however she is not symptomatic currently. Patient reports she is awaiting her appt with cardiology Nov 30 and is trying to get in sooner to see them, she is also working on seeing her PCP again to address her ongoing syncope. I encouraged patient to continue to attempt to be evaluated for her ongoing hypotension and syncopal episodes.   PLAN:  Complete course of Xifaxan 2.  Stay well hydrated 3. Keep track of BP and setup appt with PCP for evaluation of syncopal episodes 4. Further recommendations to follow after xifaxan course completed  Follow Up: 3 months  Clair Bardwell L. Alver Sorrow, MSN, APRN, AGNP-C Adult-Gerontology Nurse Practitioner [facility]

## 2021-07-21 NOTE — Patient Instructions (Addendum)
Please give me a call to let me know if you still have the Xifaxan (rifaximin) prescription when you get home, if you do and it is not expired, you will take one tablet three times per day x14 days. We will evaluate how your diarrhea is after you complete this.   Continue to stay well hydrated and keep your appt with your cardiologist, I would advised trying to see your PCP to discuss the episodes of passing out that you are having. It may be a good idea to keep track of your blood pressure at home as well.  We will plan to follow up in 3 months, give [place] a call to let us know how diarrhea is after completion of xifaxan.

## 2021-07-22 ENCOUNTER — Telehealth (INDEPENDENT_AMBULATORY_CARE_PROVIDER_SITE_OTHER): Payer: Self-pay

## 2021-07-22 NOTE — Progress Notes (Signed)
Cardiology Office Note:   Date:  07/23/2021  NAME:  Annette [name]    MRN: [id number] DOB:  [birth year]   PCP:  Fayrene Helper, MD  Cardiologist:  Evalina Field, MD  Electrophysiologist:  None   Referring MD: Fayrene Helper, MD   Chief Complaint  Patient presents with   Follow-up    Pt c/o extremely low blood pressure, constant dizziness, and occasional blackouts. Pt denies CP, but states when she blacks out, she has "seizure-like activity."    History of Present Illness:   Annette [name] is a 60 y.o. female with a hx of DM, HTN, HLD, IBS who presents for follow-up.  She reports she is had 5-6 syncopal episodes in the last 2 months.  She is having diarrhea as well.  Her blood pressure today in office is 82/56.  She reports no prodrome of symptoms.  She reports she blacks out.  She has no chest pain or trouble breathing.  She reports no urination or defecation.  Symptoms resolved within minutes.  She is back to her normal self rather quickly.  She otherwise is without major issues.  Her husband presents with her today.  Apparently she has shaking spells.  He reports possible seizure-like activity.  I suspect these are just vasovagal events.  I truly suspect dehydration is a major issue.  She is also on 3 blood pressure medications.  She cannot continue these.  She is not drinking enough water.  She may drink 1-2 tea drinks per day.  I did inform her that caffeine will dehydrate her.  I suspect her major issue is dehydration.  EKG in office shows normal sinus rhythm with no acute ischemic changes.  She recently went CT lung cancer screening.  On my review there is no coronary calcium.  Echocardiogram in April was normal.  Cardiovascular examination is normal.  She is still smoking 1/2 pack/day.  She is smoked for 40 years.  We discussed that smoking will not help any of her issues.  She is going to try to quit.   CT Lung CA Screening 04/20/2021 -> trivial calcium in RCA which  could be artifact.   Problem List Diabetes -A1c 5.9 HTN Tobacco abuse  -40 years  HLD -T chol 149, HDL 53, LDL 75, TG 118 5. IBS  Past Medical History: Past Medical History:  Diagnosis Date   Arthritis    Phreesia 10/14/2020   Blood transfusion without reported diagnosis    Phreesia 10/14/2020   Breast cancer ([place]) 2007   History of  XRT, onTamoxifen   Bruises easily    Cancer ([place])    Phreesia 10/14/2020   Depression    Depression    Phreesia 10/14/2020   Diabetes mellitus    prediabetic   Diabetes mellitus without complication ([place])    Phreesia 10/14/2020   GERD (gastroesophageal reflux disease)    Hypertriglyceridemia    Hypothyroidism    following chemo amnd radiaition for breast cancer, needed replacement short term   Hypothyroidism (acquired)    replaced x 1 year   IBS (irritable bowel syndrome)    Menieres disease    Controlled with triamterene    Migraines    Obesity    Palpitations    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[place])    Sleep apnea 2010   Problems with CPAP    Past Surgical History: Past Surgical History:  Procedure Laterality Date   ABDOMINAL EXPOSURE N/A 12/03/2020   Procedure: ABDOMINAL EXPOSURE;  Surgeon:  EarlyArvilla Meres, MD;  Location: [facility] OR;  Service: Vascular;  Laterality: N/A;   ABDOMINAL HYSTERECTOMY  1998   Benign, fibroids   ADRENALECTOMY  02/16/2012   [facility], splemic trauma, 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   ANTERIOR LUMBAR FUSION N/A 12/03/2020   Procedure: Lumbar Four-Five Lumbar Five Sacral One Anterior lumbar interbody fusion;  Surgeon: Ashok Pall, MD;  Location: [facility];  Service: Neurosurgery;  Laterality: N/A;   APPENDECTOMY  06/12/08   BIOPSY  07/02/2020   Procedure: BIOPSY;  Surgeon: Harvel Quale, MD;  [facility];  Service: Gastroenterology;;   BIOPSY  01/22/2021   Procedure: BIOPSY;  Surgeon: Daneil Dolin, MD;  [facility];  Service: Endoscopy;;   BREAST LUMPECTOMY Left 2008   BREAST RECONSTRUCTION      Left reconstructive   BREAST SURGERY  2007   Left lumpectomy   BREAST SURGERY     Mammosite - right side   CHOLECYSTECTOMY  1985   COLONOSCOPY WITH PROPOFOL N/A 07/02/2020   Procedure: COLONOSCOPY WITH PROPOFOL;  Surgeon: Harvel Quale, MD;  [facility];  Service: Gastroenterology;  Laterality: N/A;  1045   COLONOSCOPY WITH PROPOFOL N/A 04/01/2021   Procedure: COLONOSCOPY WITH PROPOFOL;  Surgeon: Harvel Quale, MD;  [facility];  Service: Gastroenterology;  Laterality: N/A;  12:50   ESOPHAGEAL DILATION  12/19/2015   Procedure: ESOPHAGEAL DILATION;  Surgeon: Rogene Houston, MD;  [facility];  Service: Endoscopy;;   ESOPHAGOGASTRODUODENOSCOPY N/A 12/19/2015   Procedure: ESOPHAGOGASTRODUODENOSCOPY (EGD);  Surgeon: Rogene Houston, MD;  [facility];  Service: Endoscopy;  Laterality: N/A;  11:40   FLEXIBLE SIGMOIDOSCOPY N/A 01/22/2021   Procedure: FLEXIBLE SIGMOIDOSCOPY;  Surgeon: Daneil Dolin, MD;  [facility];  Service: Endoscopy;  Laterality: N/A;   HEMOSTASIS CLIP PLACEMENT  04/01/2021   Procedure: HEMOSTASIS CLIP PLACEMENT;  Surgeon: Harvel Quale, MD;  [facility];  Service: Gastroenterology;;  x2   POLYPECTOMY  07/02/2020   Procedure: POLYPECTOMY;  Surgeon: Harvel Quale, MD;  [facility];  Service: Gastroenterology;;   POLYPECTOMY  04/01/2021   Procedure: POLYPECTOMY INTESTINAL;  Surgeon: Harvel Quale, MD;  [facility];  Service: Gastroenterology;;   SPLENECTOMY, TOTAL  0/35/0093   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   CLOTRIMAZOLE EX Apply 1 application topically daily as needed (itching an breast).   cyclobenzaprine (FLEXERIL) 10 MG tablet  Take 1 tablet (10 mg total) by mouth 3 (three) times daily as needed for muscle spasms. (Patient taking differently: Take 10 mg by mouth at bedtime.)   gabapentin (NEURONTIN) 300 MG capsule Take 1 capsule (300 mg total) by mouth 3 (three) times daily. (Patient taking differently: Take 600 mg by mouth 3 (three) times daily.)   glucose blood test strip See admin instructions.   insulin degludec (TRESIBA FLEXTOUCH) 200 UNIT/ML FlexTouch Pen Inject 24 Units into the skin at bedtime.   Insulin Pen Needle (PEN NEEDLES) 31G X 6 MM MISC See admin instructions.   Lancets (ONETOUCH DELICA PLUS [id number]) MISC Use 4 lancets per day to check blood sugar   LORazepam (ATIVAN) 1 MG tablet Take 1 mg by mouth 2 (two) times daily as needed (Nausea).   naproxen (  NAPROSYN) 500 MG tablet TAKE 1 TABLET(500 MG) BY MOUTH TWICE DAILY WITH A MEAL   omeprazole (PRILOSEC) 40 MG capsule Take 1 capsule (40 mg total) by mouth daily.   ONETOUCH VERIO test strip USE AS DIRECTED FOUR TIMES DAILY   rifaximin (XIFAXAN) 550 MG TABS tablet Take 550 mg by mouth 3 (three) times daily.   rosuvastatin (CRESTOR) 20 MG tablet Take 1 tablet (20 mg total) by mouth daily.   temazepam (RESTORIL) 15 MG capsule Take 1 capsule (15 mg total) by mouth at bedtime as needed for sleep.   traZODone (DESYREL) 100 MG tablet TAKE 1 TABLET(100 MG) BY MOUTH AT BEDTIME   TRULICITY 1.5 MP/5.3IR SOPN INJECT 1.5MG  SUBCUTANEOUSLY ONCE A WEEK   UNABLE TO FIND CPAP tubing and supplies, face mask Dx: G47.30   venlafaxine XR (EFFEXOR-XR) 75 MG 24 hr capsule Take 1 capsule (75 mg total) by mouth daily with breakfast. TAKE 1 CAPSULE(75 MG) BY MOUTH DAILY   Vitamin D, Ergocalciferol, (DRISDOL) 1.25 MG (50000 UNIT) CAPS capsule Take 1 capsule (50,000 Units total) by mouth once a week.   [DISCONTINUED] Metoprolol Tartrate 37.5 MG TABS Take 37.5 mg by mouth 2 (two) times daily.   [DISCONTINUED] triamterene-hydrochlorothiazide (DYAZIDE) 37.5-25 MG capsule Take 1 each (1  capsule total) by mouth in the morning.     Allergies:    Penicillins, Metformin and related, Wound dressing adhesive, Empagliflozin, Statins, and Zetia [ezetimibe]   Social History: Social History   Socioeconomic History   Marital status: Married    Spouse name: Alroy Dust   Number of children: 2   Years of education: Not on file   Highest education level: Not on file  Occupational History   Not on file  Tobacco Use   Smoking status: Every Day    Packs/day: 0.50    Years: 40.00    Pack years: 20.00    Types: Cigarettes   Smokeless tobacco: Never   Tobacco comments:    1/2 pack a day  Vaping Use   Vaping Use: Never used  Substance and Sexual Activity   Alcohol use: No    Alcohol/week: 0.0 standard drinks    Comment: Encouraged to quit smoking. She has tried the nicotone gum and patches but did not help   Drug use: No   Sexual activity: Yes    Birth control/protection: Surgical  Other Topics Concern   Not on file  Social History Narrative   Not on file   Social Determinants of Health   Financial Resource Strain: Medium Risk   Difficulty of Paying Living Expenses: Somewhat hard  Food Insecurity: No Food Insecurity   Worried About Charity fundraiser in the Last Year: Never true   Ran Out of Food in the Last Year: Never true  Transportation Needs: No Transportation Needs   Lack of Transportation (Medical): No   Lack of Transportation (Non-Medical): No  Physical Activity: Inactive   Days of Exercise per Week: 0 days   Minutes of Exercise per Session: 0 min  Stress: Stress Concern Present   Feeling of Stress : To some extent  Social Connections: Moderately Integrated   Frequency of Communication with Friends and Family: Three times a week   Frequency of Social Gatherings with Friends and Family: Three times a week   Attends Religious Services: More than 4 times per year   Active Member of Clubs or Organizations: No   Attends Archivist Meetings: Never    Marital Status: Married  Family History: The patient's family history includes Arrhythmia in her father and mother; Cancer in her paternal grandmother; Diabetes in her brother, mother, and sister; Heart disease in her brother and father; Hyperlipidemia in her father.  ROS:   All other ROS reviewed and negative. Pertinent positives noted in the HPI.     EKGs/Labs/Other Studies Reviewed:   The following studies were personally reviewed by me today:  EKG:  EKG is ordered today.  The ekg ordered today demonstrates normal sinus rhythm heart rate 69, no acute ischemic changes or evidence of infarction, and was personally reviewed by me.  TTE 01/17/2021  1. Left ventricular ejection fraction, by estimation, is 60 to 65%. The  left ventricle has normal function. The left ventricle has no regional  wall motion abnormalities. Left ventricular diastolic parameters were  normal.   2. Right ventricular systolic function is normal. The right ventricular  size is normal. Tricuspid regurgitation signal is inadequate for assessing  PA pressure.   3. The mitral valve is normal in structure. Trivial mitral valve  regurgitation. No evidence of mitral stenosis.   4. The aortic valve is normal in structure. Aortic valve regurgitation is  not visualized. No aortic stenosis is present.   5. The inferior vena cava is normal in size with greater than 50%  respiratory variability, suggesting right atrial pressure of 3 mmHg.    Recent Labs: 01/16/2021: TSH 1.835 01/23/2021: Hemoglobin 12.2; Platelets 303 02/05/2021: Magnesium 2.1 06/15/2021: ALT 15; BUN 11; Creatinine, Ser 1.03; Potassium 4.7; Sodium 142   Recent Lipid Panel    Component Value Date/Time   CHOL 149 06/15/2021 1615   TRIG 118 06/15/2021 1615   HDL 53 06/15/2021 1615   CHOLHDL 2.8 06/15/2021 1615   CHOLHDL 5.0 11/15/2014 1119   VLDL 25 11/15/2014 1119   LDLCALC 75 06/15/2021 1615    Physical Exam:   VS:  BP (!) 82/56   Pulse 69   Ht  5\' 6"  (1.676 m)   Wt 197 lb 4.8 oz (89.5 kg)   BMI 31.85 kg/m    Wt Readings from Last 3 Encounters:  07/23/21 197 lb 4.8 oz (89.5 kg)  07/21/21 194 lb 3.2 oz (88.1 kg)  05/19/21 194 lb 12.8 oz (88.4 kg)    General: Well nourished, well developed, in no acute distress Head: Atraumatic, normal size  Eyes: PEERLA, EOMI  Neck: Supple, no JVD Endocrine: No thryomegaly Cardiac: Normal S1, S2; RRR; no murmurs, rubs, or gallops Lungs: Clear to auscultation bilaterally, no wheezing, rhonchi or rales  Abd: Soft, nontender, no hepatomegaly  Ext: No edema, pulses 2+ Musculoskeletal: No deformities, BUE and BLE strength normal and equal Skin: Warm and dry, no rashes   Neuro: Alert and oriented to person, place, time, and situation, CNII-XII grossly intact, no focal deficits  Psych: Normal mood and affect   ASSESSMENT:   Annette [name] is a 60 y.o. female who presents for the following: 1. Syncope and collapse   2. Dehydration   3. Tobacco abuse     PLAN:   1. Syncope and collapse 2. Dehydration -She presents with recurrent syncopal events.  She has had 5-6 episodes.  She describes blackout spells.  She has no red flag symptoms prior to the episodes.  Her EKG in office is normal.  Most recent echocardiogram is normal.  Recent CT lung cancer screening which shows no coronary calcium on my review.  She may have trivial calcium in the aortic root that was just motion  artifact and read as RCA distribution.  To me the fact that these events keep recurring points to a noncardiac source.  I think the major issue here is hypotension, dehydration, and persistent use of blood pressure medications.  She is on metoprolol and will stop this.  She is also on triamterene and HCTZ for Mnire's disease.  Unfortunately I have informed her that she cannot be on this medication at this time.  I highly suspect her passing out spells are related.  We will stop all 3 medications today.  Given the fact that her EKG  is normal.  Most recent echo was normal.  She has no concerning symptoms for CAD I see no need for further evaluation of the medication change today.  She has to also start drinking water.  She does not consume any water at all.  I would like for her to start consuming 60 to 80 ounces per day.  The other issue is her IBS with predominant diarrhea symptoms.  She is working with GI on this.  This is clearly contributing to dehydration.  Overall this is multifactorial but likely noncardiac in etiology.  I will see her back in 3 months.  Hopefully with a conservative approach her symptoms will improve.  She was counseled that no further driving can proceed.  She must be syncope free for at least 1 month given the benign nature of what I suspect her syncope is.  I do not suspect seizure disorder.  She will not drive until she sees me back just to make sure everything is okay.  3. Tobacco abuse -4 minutes of smoking cessation counseling was provided in office.  Disposition: Return in about 3 months (around 10/23/2021).  Medication Adjustments/Labs and Tests Ordered: Current medicines are reviewed at length with the patient today.  Concerns regarding medicines are outlined above.  Orders Placed This Encounter  Procedures   EKG 12-Lead    No orders of the defined types were placed in this encounter.   Patient Instructions  Medication Instructions:  Stop Metoprolol  Stop Dyazide  *heart rate may increase, but this is okay- just let us know of any issues*  *If you need a refill on your cardiac medications before your next appointment, please call your pharmacy*   Follow-Up: At [facility], you and your health needs are our priority.  As part of our continuing mission to provide you with exceptional heart care, we have created designated Provider Care Teams.  These Care Teams include your primary Cardiologist (physician) and Advanced Practice Providers (APPs -  Physician Assistants and Nurse  Practitioners) who all work together to provide you with the care you need, when you need it.  We recommend signing up for the patient portal called "MyChart".  Sign up information is provided on this After Visit Summary.  MyChart is used to connect with patients for Virtual Visits (Telemedicine).  Patients are able to view lab/test results, encounter notes, upcoming appointments, etc.  Non-urgent messages can be sent to your provider as well.   To learn more about what you can do with MyChart, go to [email or web address].    Your next appointment:   3 month(s)  The format for your next appointment:   In Person  Provider:   Eleonore Chiquito, MD   Other Instructions Drink 60-80 oz of water a day.    Time Spent with Patient: I have spent a total of 35 minutes with patient reviewing hospital notes, telemetry, EKGs, labs and examining the  patient as well as establishing an assessment and plan that was discussed with the patient.  > 50% of time was spent in direct patient care.  Signed, Addison Naegeli. Audie Box, MD, [place]  [address], [place], [place] [address] [phone]  07/23/2021 9:15 AM

## 2021-07-22 NOTE — Telephone Encounter (Signed)
Patient called today to report that she does in fact have the Xifaxan and she will start it today 07/22/2021.

## 2021-07-23 ENCOUNTER — Encounter (HOSPITAL_BASED_OUTPATIENT_CLINIC_OR_DEPARTMENT_OTHER): Payer: Self-pay | Admitting: Cardiovascular Disease

## 2021-07-23 ENCOUNTER — Ambulatory Visit (INDEPENDENT_AMBULATORY_CARE_PROVIDER_SITE_OTHER): Payer: BC Managed Care – PPO | Admitting: Cardiovascular Disease

## 2021-07-23 ENCOUNTER — Other Ambulatory Visit: Payer: Self-pay

## 2021-07-23 VITALS — BP 82/56 | HR 69 | Ht 66.0 in | Wt 197.3 lb

## 2021-07-23 DIAGNOSIS — R55 Syncope and collapse: Secondary | ICD-10-CM | POA: Diagnosis not present

## 2021-07-23 DIAGNOSIS — E860 Dehydration: Secondary | ICD-10-CM

## 2021-07-23 DIAGNOSIS — Z720 Tobacco use: Secondary | ICD-10-CM | POA: Diagnosis not present

## 2021-07-23 NOTE — Patient Instructions (Signed)
Medication Instructions:  Stop Metoprolol  Stop Dyazide  *heart rate may increase, but this is okay- just let us know of any issues*  *If you need a refill on your cardiac medications before your next appointment, please call your pharmacy*   Follow-Up: At [facility], you and your health needs are our priority.  As part of our continuing mission to provide you with exceptional heart care, we have created designated Provider Care Teams.  These Care Teams include your primary Cardiologist (physician) and Advanced Practice Providers (APPs -  Physician Assistants and Nurse Practitioners) who all work together to provide you with the care you need, when you need it.  We recommend signing up for the patient portal called "MyChart".  Sign up information is provided on this After Visit Summary.  MyChart is used to connect with patients for Virtual Visits (Telemedicine).  Patients are able to view lab/test results, encounter notes, upcoming appointments, etc.  Non-urgent messages can be sent to your provider as well.   To learn more about what you can do with MyChart, go to [email or web address].    Your next appointment:   3 month(s)  The format for your next appointment:   In Person  Provider:   Eleonore Chiquito, MD   Other Instructions Drink 60-80 oz of water a day.

## 2021-08-12 ENCOUNTER — Other Ambulatory Visit: Payer: Self-pay | Admitting: Family Medicine

## 2021-08-25 ENCOUNTER — Encounter: Payer: Self-pay | Admitting: Family Medicine

## 2021-08-25 ENCOUNTER — Other Ambulatory Visit: Payer: Self-pay

## 2021-08-25 ENCOUNTER — Ambulatory Visit (INDEPENDENT_AMBULATORY_CARE_PROVIDER_SITE_OTHER): Payer: BC Managed Care – PPO | Admitting: Family Medicine

## 2021-08-25 VITALS — BP 110/65 | HR 85 | Resp 16 | Ht 66.0 in | Wt 200.1 lb

## 2021-08-25 DIAGNOSIS — M25552 Pain in left hip: Secondary | ICD-10-CM | POA: Diagnosis not present

## 2021-08-25 DIAGNOSIS — F17218 Nicotine dependence, cigarettes, with other nicotine-induced disorders: Secondary | ICD-10-CM

## 2021-08-25 DIAGNOSIS — E279 Disorder of adrenal gland, unspecified: Secondary | ICD-10-CM

## 2021-08-25 DIAGNOSIS — Z9189 Other specified personal risk factors, not elsewhere classified: Secondary | ICD-10-CM

## 2021-08-25 DIAGNOSIS — R079 Chest pain, unspecified: Secondary | ICD-10-CM

## 2021-08-25 DIAGNOSIS — Z23 Encounter for immunization: Secondary | ICD-10-CM

## 2021-08-25 DIAGNOSIS — Z0001 Encounter for general adult medical examination with abnormal findings: Secondary | ICD-10-CM | POA: Diagnosis not present

## 2021-08-25 DIAGNOSIS — G8929 Other chronic pain: Secondary | ICD-10-CM

## 2021-08-25 DIAGNOSIS — Z0000 Encounter for general adult medical examination without abnormal findings: Secondary | ICD-10-CM | POA: Insufficient documentation

## 2021-08-25 DIAGNOSIS — E278 Other specified disorders of adrenal gland: Secondary | ICD-10-CM | POA: Diagnosis not present

## 2021-08-25 DIAGNOSIS — M25562 Pain in left knee: Secondary | ICD-10-CM

## 2021-08-25 MED ORDER — TEMAZEPAM 15 MG PO CAPS: 15.0000 mg | ORAL_CAPSULE | Freq: Every evening | ORAL | 1 refills | Status: DC | PRN

## 2021-08-25 MED ORDER — NAPROXEN 500 MG PO TABS: ORAL_TABLET | ORAL | 0 refills | Status: DC

## 2021-08-25 NOTE — Patient Instructions (Addendum)
F/U in 5 months, call if you need me sooner  Flu vaccine today, if she agrees  You are referred to Cardiology and also to Orthopedics,( already has an appt scheduled with cardiology for 11/130/2022)  I will refer you to dr Tera Helper re adrenal nodule  Try to commit to smoking no more than 8 ciggs/ day starting in January please  Thanks for choosing [facility], we consider it a privelige to serve you.

## 2021-08-25 NOTE — Assessment & Plan Note (Signed)
Increased exertional dyspnea and poor exercise tolerance in apst 2 months, refer card

## 2021-08-25 NOTE — Progress Notes (Signed)
Not the provider for the visit

## 2021-08-26 ENCOUNTER — Emergency Department (HOSPITAL_COMMUNITY)
Admission: EM | Admit: 2021-08-26 | Discharge: 2021-08-26 | Disposition: A | Discharge status: Home / Self Care | Payer: BC Managed Care – PPO

## 2021-08-31 ENCOUNTER — Encounter: Payer: Self-pay | Admitting: Family Medicine

## 2021-08-31 DIAGNOSIS — E278 Other specified disorders of adrenal gland: Secondary | ICD-10-CM | POA: Insufficient documentation

## 2021-08-31 DIAGNOSIS — E279 Disorder of adrenal gland, unspecified: Secondary | ICD-10-CM | POA: Insufficient documentation

## 2021-08-31 NOTE — Progress Notes (Signed)
     Annette [name]     MRN: [id number]      DOB: 1961-04-22  HPI: Patient is in for annual physical exam. C/o left knee pain and ant swelling following a fall on 08/11/2021., also has chronic left hip pain which limits safe mobility Record review describes right adrenal nodule, she already had a left removed with will refer Onc for e/m C/o fatigue and poor exercise tolerance high CV risk Recent labs,  are reviewed. Immunization is reviewed , and  updated .   PE: BP 110/65   Pulse 85   Resp 16   Ht 5\' 6"  (1.676 m)   Wt 200 lb 1.9 oz (90.8 kg)   SpO2 93%   BMI 32.30 kg/m   Pleasant  female, alert and oriented x 3, in no cardio-pulmonary distress. Afebrile. HEENT No facial trauma or asymetry. Sinuses non tender.  Extra occullar muscles intact.. External ears normal, . Neck: supple, no adenopathy,JVD or thyromegaly.No bruits.  Chest: Clear to ascultation bilaterally.No crackles or wheezes.decreased air entry  Non tender to palpation  Breast: No asymetry,no masses or lumps. No tenderness. No nipple discharge or inversion. No axillary or supraclavicular adenopathy  Cardiovascular system; Heart sounds normal,  S1 and  S2 ,no S3.  No murmur, or thrill. Apical beat not displaced Peripheral pulses normal.  Abdomen: Soft, non tender, no organomegaly or masses. No bruits. Bowel sounds normal. No guarding, tenderness or rebound.    Musculoskeletal exam: Decreased  ROM of spine, hips , and left knees. deformity ,swelling and  crepitus noted.of left knee No muscle wasting or atrophy.   Neurologic: Cranial nerves 2 to 12 intact. Power, tone ,sensation and reflexes normal throughout. No disturbance in gait. No tremor.  Skin: Intact, no ulceration, erythema , scaling or rash noted. Pigmentation normal throughout  Psych; Normal mood and affect. Judgement and concentration normal   Assessment & Plan:  At risk for cardiovascular event Increased exertional  dyspnea and poor exercise tolerance in apst 2 months, refer card  Annual physical exam Annual exam as documented. Counseling done  re healthy lifestyle involving commitment to 150 minutes exercise per week, heart healthy diet, and attaining healthy weight.The importance of adequate sleep also discussed. Regular seat belt use and home safety, is also discussed. Changes in health habits are decided on by the patient with goals and time frames  set for achieving them. Immunization and cancer screening needs are specifically addressed at this visit.   Left knee pain Acute painand swelling following trauma on 08/11/2021, refer Ortho  Left hip pain Chronic , worsening, limits mobility, refer Ortho  Nicotine dependence Asked:confirms currently smokes cigarettes Assess: Unwilling to set a quit date, but is cutting back Advise: needs to QUIT to reduce risk of cancer, cardio and cerebrovascular disease Assist: counseled for 5 minutes and literature provided Arrange: follow up in 2 to 4 months   Adrenal nodule ([place]) Recent development of 2nd right adrenal nodule and is s/p left adrenalectomy, has h/o breast cancer and is a smoker refer Onc for eval and management

## 2021-08-31 NOTE — Assessment & Plan Note (Signed)
Acute painand swelling following trauma on 08/11/2021, refer Ortho

## 2021-08-31 NOTE — Assessment & Plan Note (Signed)
Asked:confirms currently smokes cigarettes °Assess: Unwilling to set a quit date, but is cutting back °Advise: needs to QUIT to reduce risk of cancer, cardio and cerebrovascular disease °Assist: counseled for 5 minutes and literature provided °Arrange: follow up in 2 to 4 months ° °

## 2021-08-31 NOTE — Assessment & Plan Note (Signed)

## 2021-08-31 NOTE — Assessment & Plan Note (Signed)
Recent development of 2nd right adrenal nodule and is s/p left adrenalectomy, has h/o breast cancer and is a smoker refer Onc for eval and management

## 2021-08-31 NOTE — Assessment & Plan Note (Signed)
Chronic , worsening, limits mobility, refer Ortho

## 2021-09-01 NOTE — Progress Notes (Signed)
Cardiology Office Note:   Date:  09/02/2021  NAME:  Annette [name]    MRN: [id number] DOB:  10/11/1960   PCP:  Fayrene Helper, MD  Cardiologist:  Evalina Field, MD  Electrophysiologist:  None   Referring MD: Fayrene Helper, MD   Chief Complaint  Patient presents with   Follow-up    6 months.   History of Present Illness:   Annette [name] is a 60 y.o. female with a hx of HTN, tobacco abuse, DM who presents for follow-up. Seen 07/23/21 for syncope. Had several syncopal episodes with hypotension. Main issue was diarrhea. She was on 3 BP medications that we stopped. We encouraged PO intake as well.   She reports no more syncopal episodes.  She is not getting dizzy or lightheaded.  We stopped her blood pressure medications at her last visit.  She actually is doing well.  Her diarrhea has improved also.  She was seen by her primary care physician.  She does report shortness of breath with exertion.  She is also had neck pain as well as back pain at times.  Does not appear to be exertional but can occur at times.  She is undergone evaluation in the past with a normal echo as well as normal BNP values.  There is no evidence of congestive heart failure.  She did have pulmonary function testing in 2018.  This showed a moderate diffusion defect.  This was never fully evaluated per her report.  Her chest symptoms do not appear to be occurring with exertion.  She reports it comes and goes.  The shortness of breath does occur with exertion.  I did inform her the last step would be to exclude coronary disease.  She is okay to pursue coronary CTA.  She is still smoking 1/2 pack/day.  She is smoked for 40 years.  I have encouraged her to quit.  I do believe she needs pulmonary evaluation as this is a more likely etiology for her shortness of breath.  Blood pressure well controlled today.  Most recent lipid profile within limits.  She did have a CT lung cancer screening test with minimal  coronary calcifications.  Problem List Diabetes -A1c 5.9 HTN Tobacco abuse  -40 years  HLD -T chol 149, HDL 53, LDL 75, TG 118 -coronary calcifications on chest CT  5. IBS  Past Medical History: Past Medical History:  Diagnosis Date   Arthritis    Phreesia 10/14/2020   Blood transfusion without reported diagnosis    Phreesia 10/14/2020   Breast cancer ([place]) 2007   History of  XRT, onTamoxifen   Bruises easily    Cancer ([place])    Phreesia 10/14/2020   Depression    Depression    Phreesia 10/14/2020   Diabetes mellitus    prediabetic   Diabetes mellitus without complication ([place])    Phreesia 10/14/2020   GERD (gastroesophageal reflux disease)    Hypertriglyceridemia    Hypothyroidism    following chemo amnd radiaition for breast cancer, needed replacement short term   Hypothyroidism (acquired)    replaced x 1 year   IBS (irritable bowel syndrome)    Menieres disease    Controlled with triamterene    Migraines    Obesity    Palpitations    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[place])    Sleep apnea 2010   Problems with CPAP    Past Surgical History: Past Surgical History:  Procedure Laterality Date   ABDOMINAL EXPOSURE N/A 12/03/2020  Procedure: ABDOMINAL EXPOSURE;  Surgeon: Rosetta Posner, MD;  Location: [facility];  Service: Vascular;  Laterality: N/A;   ABDOMINAL HYSTERECTOMY  1998   Benign, fibroids   ADRENALECTOMY  02/16/2012   [facility], splemic trauma, 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   ANTERIOR LUMBAR FUSION N/A 12/03/2020   Procedure: Lumbar Four-Five Lumbar Five Sacral One Anterior lumbar interbody fusion;  Surgeon: Ashok Pall, MD;  Location: [facility];  Service: Neurosurgery;  Laterality: N/A;   APPENDECTOMY  06/12/08   BIOPSY  07/02/2020   Procedure: BIOPSY;  Surgeon: Harvel Quale, MD;  [facility];  Service: Gastroenterology;;   BIOPSY  01/22/2021   Procedure: BIOPSY;  Surgeon: Daneil Dolin, MD;  [facility];  Service: Endoscopy;;   BREAST  LUMPECTOMY Left 2008   BREAST RECONSTRUCTION     Left reconstructive   BREAST SURGERY  2007   Left lumpectomy   BREAST SURGERY     Mammosite - right side   CHOLECYSTECTOMY  1985   COLONOSCOPY WITH PROPOFOL N/A 07/02/2020   Procedure: COLONOSCOPY WITH PROPOFOL;  Surgeon: Harvel Quale, MD;  [facility];  Service: Gastroenterology;  Laterality: N/A;  1045   COLONOSCOPY WITH PROPOFOL N/A 04/01/2021   Procedure: COLONOSCOPY WITH PROPOFOL;  Surgeon: Harvel Quale, MD;  [facility];  Service: Gastroenterology;  Laterality: N/A;  12:50   ESOPHAGEAL DILATION  12/19/2015   Procedure: ESOPHAGEAL DILATION;  Surgeon: Rogene Houston, MD;  [facility];  Service: Endoscopy;;   ESOPHAGOGASTRODUODENOSCOPY N/A 12/19/2015   Procedure: ESOPHAGOGASTRODUODENOSCOPY (EGD);  Surgeon: Rogene Houston, MD;  [facility];  Service: Endoscopy;  Laterality: N/A;  11:40   FLEXIBLE SIGMOIDOSCOPY N/A 01/22/2021   Procedure: FLEXIBLE SIGMOIDOSCOPY;  Surgeon: Daneil Dolin, MD;  [facility];  Service: Endoscopy;  Laterality: N/A;   HEMOSTASIS CLIP PLACEMENT  04/01/2021   Procedure: HEMOSTASIS CLIP PLACEMENT;  Surgeon: Harvel Quale, MD;  [facility];  Service: Gastroenterology;;  x2   POLYPECTOMY  07/02/2020   Procedure: POLYPECTOMY;  Surgeon: Harvel Quale, MD;  [facility];  Service: Gastroenterology;;   POLYPECTOMY  04/01/2021   Procedure: POLYPECTOMY INTESTINAL;  Surgeon: Harvel Quale, MD;  [facility];  Service: Gastroenterology;;   SPLENECTOMY, 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   CLOTRIMAZOLE EX Apply 1 application topically daily as needed (itching an breast).    cyclobenzaprine (FLEXERIL) 10 MG tablet TAKE 1 TABLET BY MOUTH EVERY DAY AT BEDTIME AS NEEDED FOR MUSCLE SPASM   gabapentin (NEURONTIN) 300 MG capsule Take 1 capsule (300 mg total) by mouth 3 (three) times daily. (Patient taking differently: Take 600 mg by mouth 3 (three) times daily.)   glucose blood test strip See admin instructions.   insulin degludec (TRESIBA FLEXTOUCH) 200 UNIT/ML FlexTouch Pen Inject 24 Units into the skin at bedtime.   Insulin Pen Needle (PEN NEEDLES) 31G X 6 MM MISC See admin instructions.   Lancets (ONETOUCH DELICA PLUS [id number]) MISC Use 4 lancets per day to check blood sugar   LORazepam (ATIVAN) 1 MG tablet Take 1 mg by mouth 2 (two) times daily as needed (Nausea).   metoprolol tartrate (LOPRESSOR) 100 MG tablet Take 1 tablet  by mouth once for procedure.   naproxen (NAPROSYN) 500 MG tablet TAKE 1 TABLET(500 MG) BY MOUTH TWICE DAILY WITH A MEAL   omeprazole (PRILOSEC) 40 MG capsule Take 1 capsule (40 mg total) by mouth daily.   ONETOUCH VERIO test strip USE AS DIRECTED FOUR TIMES DAILY   rifaximin (XIFAXAN) 550 MG TABS tablet Take 550 mg by mouth 3 (three) times daily.   rosuvastatin (CRESTOR) 20 MG tablet Take 1 tablet (20 mg total) by mouth daily.   temazepam (RESTORIL) 15 MG capsule Take 1 capsule (15 mg total) by mouth at bedtime as needed for sleep.   traZODone (DESYREL) 100 MG tablet TAKE 1 TABLET(100 MG) BY MOUTH AT BEDTIME   TRULICITY 1.5 GY/6.9SW SOPN INJECT 1.5MG  SUBCUTANEOUSLY ONCE A WEEK   UNABLE TO FIND CPAP tubing and supplies, face mask Dx: G47.30   venlafaxine XR (EFFEXOR-XR) 75 MG 24 hr capsule Take 1 capsule (75 mg total) by mouth daily with breakfast. TAKE 1 CAPSULE(75 MG) BY MOUTH DAILY   Vitamin D, Ergocalciferol, (DRISDOL) 1.25 MG (50000 UNIT) CAPS capsule Take 1 capsule (50,000 Units total) by mouth once a week.     Allergies:    Penicillins, Metformin and related, Wound dressing adhesive, Empagliflozin, Statins, and Zetia [ezetimibe]    Social History: Social History   Socioeconomic History   Marital status: Married    Spouse name: Alroy Dust   Number of children: 2   Years of education: Not on file   Highest education level: Not on file  Occupational History   Not on file  Tobacco Use   Smoking status: Every Day    Packs/day: 0.50    Years: 40.00    Pack years: 20.00    Types: Cigarettes   Smokeless tobacco: Never   Tobacco comments:    1/2 pack a day  Vaping Use   Vaping Use: Never used  Substance and Sexual Activity   Alcohol use: No    Alcohol/week: 0.0 standard drinks    Comment: Encouraged to quit smoking. She has tried the nicotone gum and patches but did not help   Drug use: No   Sexual activity: Yes    Birth control/protection: Surgical  Other Topics Concern   Not on file  Social History Narrative   Not on file   Social Determinants of Health   Financial Resource Strain: Medium Risk   Difficulty of Paying Living Expenses: Somewhat hard  Food Insecurity: No Food Insecurity   Worried About Charity fundraiser in the Last Year: Never true   Ran Out of Food in the Last Year: Never true  Transportation Needs: No Transportation Needs   Lack of Transportation (Medical): No   Lack of Transportation (Non-Medical): No  Physical Activity: Inactive   Days of Exercise per Week: 0 days   Minutes of Exercise per Session: 0 min  Stress: Stress Concern Present   Feeling of Stress : To some extent  Social Connections: Moderately Integrated   Frequency of Communication with Friends and Family: Three times a week   Frequency of Social Gatherings with Friends and Family: Three times a week   Attends Religious Services: More than 4 times per year   Active Member of Clubs or Organizations: No   Attends Archivist Meetings: Never   Marital Status: Married    Family History: The patient's family history includes Arrhythmia in her father and mother; Cancer in her paternal grandmother; Diabetes in  her brother, mother, and sister; Heart disease  in her brother and father; Hyperlipidemia in her father.  ROS:   All other ROS reviewed and negative. Pertinent positives noted in the HPI.     EKGs/Labs/Other Studies Reviewed:   The following studies were personally reviewed by me today:  TTE 01/17/2021  1. Left ventricular ejection fraction, by estimation, is 60 to 65%. The  left ventricle has normal function. The left ventricle has no regional  wall motion abnormalities. Left ventricular diastolic parameters were  normal.   2. Right ventricular systolic function is normal. The right ventricular  size is normal. Tricuspid regurgitation signal is inadequate for assessing  PA pressure.   3. The mitral valve is normal in structure. Trivial mitral valve  regurgitation. No evidence of mitral stenosis.   4. The aortic valve is normal in structure. Aortic valve regurgitation is  not visualized. No aortic stenosis is present.   5. The inferior vena cava is normal in size with greater than 50%  respiratory variability, suggesting right atrial pressure of 3 mmHg.   Recent Labs: 01/16/2021: TSH 1.835 01/23/2021: Hemoglobin 12.2; Platelets 303 02/05/2021: Magnesium 2.1 06/15/2021: ALT 15; BUN 11; Creatinine, Ser 1.03; Potassium 4.7; Sodium 142   Recent Lipid Panel    Component Value Date/Time   CHOL 149 06/15/2021 1615   TRIG 118 06/15/2021 1615   HDL 53 06/15/2021 1615   CHOLHDL 2.8 06/15/2021 1615   CHOLHDL 5.0 11/15/2014 1119   VLDL 25 11/15/2014 1119   LDLCALC 75 06/15/2021 1615    Physical Exam:   VS:  BP 104/68 (BP Location: Left Arm, Patient Position: Sitting, Cuff Size: Normal)   Pulse 96   Ht 5\' 6"  (1.676 m)   Wt 197 lb (89.4 kg)   BMI 31.80 kg/m    Wt Readings from Last 3 Encounters:  09/02/21 197 lb (89.4 kg)  08/25/21 200 lb 1.9 oz (90.8 kg)  07/23/21 197 lb 4.8 oz (89.5 kg)    General: Well nourished, well developed, in no acute distress Head: Atraumatic, normal size   Eyes: PEERLA, EOMI  Neck: Supple, no JVD Endocrine: No thryomegaly Cardiac: Normal S1, S2; RRR; no murmurs, rubs, or gallops Lungs: Clear to auscultation bilaterally, no wheezing, rhonchi or rales  Abd: Soft, nontender, no hepatomegaly  Ext: No edema, pulses 2+ Musculoskeletal: No deformities, BUE and BLE strength normal and equal Skin: Warm and dry, no rashes   Neuro: Alert and oriented to person, place, time, and situation, CNII-XII grossly intact, no focal deficits  Psych: Normal mood and affect   ASSESSMENT:   Annette [name] is a 60 y.o. female who presents for the following: 1. Syncope and collapse   2. Dehydration   3. Precordial pain   4. Coronary artery calcification   5. SOB (shortness of breath) on exertion   6. Abnormal PFT   7. Tobacco abuse     PLAN:   1. Syncope and collapse 2. Dehydration -Evaluated for syncope during her last visit.  This was likely related to dehydration and being on too much blood pressure medication.  We stopped her medications and she is had no further symptoms.  Would recommend to continue with adequate hydration.  She does not appear to need any blood pressure medication.  3. Precordial pain 4. Coronary artery calcification 5. SOB (shortness of breath) on exertion 6. Abnormal PFT -She continues to report a constellation of symptoms including shortness of breath and neck and back pain.  Her shortness of breath is exertional.  She has low energy.  I did review her PFTs from 2018.  There was mention of a moderate perfusion defect.  I believe she needs to be evaluated by pulmonary to determine if further work-up is needed.  Her echocardiogram in the past has been normal.  BNP normal.  Her cardiovascular exam is normal.  Her EKGs in the past have been all normal.  I really believe her shortness of breath is likely related to a pulmonary issue.  However she does have evidence of minimal coronary calcification seen on recent chest CT for lung  cancer screening.  I think the best thing to do would be to exclude obstructive CAD.  We will proceed with a coronary CTA.  She will give [place] a BMP today as well as take 100 mg of metoprolol tartrate 2 hours before CTA.  This will definitively rule out any major cardiac issue.  Given her normal echoes and what I suspect will be a normal coronary CTA I believe the bigger issue here is smoking and possibly a lung issue.  7. Tobacco abuse -She continues to smoke up to half pack per day.  40 pack years.  4 minutes of smoking cessation counseling was provided today in office.  She also has evidence of moderate perfusion defect on a PFT test from 2018.  I would like to have this evaluated by pulmonary medicine.  Hopefully they can tell us if this is anything to worry about.  Disposition: Return in about 6 months (around 03/02/2022).  Medication Adjustments/Labs and Tests Ordered: Current medicines are reviewed at length with the patient today.  Concerns regarding medicines are outlined above.  Orders Placed This Encounter  Procedures   CT CORONARY MORPH W/CTA COR W/SCORE W/CA W/CM &/OR WO/CM   Basic metabolic panel   Ambulatory referral to Pulmonology    Meds ordered this encounter  Medications   metoprolol tartrate (LOPRESSOR) 100 MG tablet    Sig: Take 1 tablet by mouth once for procedure.    Dispense:  1 tablet    Refill:  0     Patient Instructions  Medication Instructions:  Take Metoprolol 100 mg two hours before CT when scheduled.   *If you need a refill on your cardiac medications before your next appointment, please call your pharmacy*   Lab Work: BMET today   If you have labs (blood work) drawn today and your tests are completely normal, you will receive your results only by: [place] (if you have MyChart) OR A paper copy in the mail If you have any lab test that is abnormal or we need to change your treatment, we will call you to review the  results.   Testing/Procedures: Your physician has requested that you have cardiac CT. Cardiac computed tomography (CT) is a painless test that uses an x-ray machine to take clear, detailed pictures of your heart. For further information please visit HugeFiesta.[email or web address]. Please follow instruction sheet as given.   Follow-Up: At [facility], you and your health needs are our priority.  As part of our continuing mission to provide you with exceptional heart care, we have created designated Provider Care Teams.  These Care Teams include your primary Cardiologist (physician) and Advanced Practice Providers (APPs -  Physician Assistants and Nurse Practitioners) who all work together to provide you with the care you need, when you need it.  We recommend signing up for the patient portal called "MyChart".  Sign up information is provided on this After Visit Summary.  MyChart is used to  connect with patients for Virtual Visits (Telemedicine).  Patients are able to view lab/test results, encounter notes, upcoming appointments, etc.  Non-urgent messages can be sent to your provider as well.   To learn more about what you can do with MyChart, go to [email or web address].    Your next appointment:   6 month(s)  The format for your next appointment:   In Person  Provider:   Evalina Field, MD     Other Instructions Referral to pulmonary- they will call you to set up an appointment.    Your cardiac CT will be scheduled at one of the below locations:   [facility] [address] [place], [place] [address] [phone]  If scheduled at [facility], please arrive at the [facility] main entrance (entrance A) of [facility] 30 minutes prior to test start time. You can use the FREE valet parking offered at the main entrance (encouraged to control the heart rate for the test) Proceed to the [facility] Radiology Department (first floor) to check-in and test  prep.  Please follow these instructions carefully (unless otherwise directed):   On the Night Before the Test: Be sure to Drink plenty of water. Do not consume any caffeinated/decaffeinated beverages or chocolate 12 hours prior to your test. Do not take any antihistamines 12 hours prior to your test.  On the Day of the Test: Drink plenty of water until 1 hour prior to the test. Do not eat any food 4 hours prior to the test. You may take your regular medications prior to the test.  Take metoprolol (Lopressor) two hours prior to test. HOLD Furosemide/Hydrochlorothiazide morning of the test. FEMALES- please wear underwire-free bra if available, avoid dresses & tight clothing      After the Test: Drink plenty of water. After receiving IV contrast, you may experience a mild flushed feeling. This is normal. On occasion, you may experience a mild rash up to 24 hours after the test. This is not dangerous. If this occurs, you can take Benadryl 25 mg and increase your fluid intake. If you experience trouble breathing, this can be serious. If it is severe call 911 IMMEDIATELY. If it is mild, please call our office. If you take any of these medications: Glipizide/Metformin, Avandament, Glucavance, please do not take 48 hours after completing test unless otherwise instructed.  Please allow 2-4 weeks for scheduling of routine cardiac CTs. Some insurance companies require a pre-authorization which may delay scheduling of this test.   For non-scheduling related questions, please contact the cardiac imaging nurse navigator should you have any questions/concerns: Marchia Bond, Cardiac Imaging Nurse Navigator Gordy Clement, Cardiac Imaging Nurse Navigator  Heart and Vascular Services Direct Office Dial: [phone]   For scheduling needs, including cancellations and rescheduling, please call [place], [phone].    Time Spent with Patient: I have spent a total of 35 minutes with  patient reviewing hospital notes, telemetry, EKGs, labs and examining the patient as well as establishing an assessment and plan that was discussed with the patient.  > 50% of time was spent in direct patient care.  Signed, Addison Naegeli. Audie Box, MD, [place]  [address] [place], [place] [address] [phone]  09/02/2021 3:57 PM

## 2021-09-02 ENCOUNTER — Encounter: Payer: Self-pay | Admitting: Family Medicine

## 2021-09-02 ENCOUNTER — Ambulatory Visit (INDEPENDENT_AMBULATORY_CARE_PROVIDER_SITE_OTHER): Payer: BC Managed Care – PPO | Admitting: Cardiovascular Disease

## 2021-09-02 ENCOUNTER — Other Ambulatory Visit: Payer: Self-pay

## 2021-09-02 ENCOUNTER — Encounter: Payer: Self-pay | Admitting: Cardiovascular Disease

## 2021-09-02 VITALS — BP 104/68 | HR 96 | Ht 66.0 in | Wt 197.0 lb

## 2021-09-02 DIAGNOSIS — R0602 Shortness of breath: Secondary | ICD-10-CM

## 2021-09-02 DIAGNOSIS — E860 Dehydration: Secondary | ICD-10-CM

## 2021-09-02 DIAGNOSIS — R072 Precordial pain: Secondary | ICD-10-CM | POA: Diagnosis not present

## 2021-09-02 DIAGNOSIS — Z720 Tobacco use: Secondary | ICD-10-CM

## 2021-09-02 DIAGNOSIS — R55 Syncope and collapse: Secondary | ICD-10-CM | POA: Diagnosis not present

## 2021-09-02 DIAGNOSIS — I2510 Atherosclerotic heart disease of native coronary artery without angina pectoris: Secondary | ICD-10-CM

## 2021-09-02 DIAGNOSIS — R942 Abnormal results of pulmonary function studies: Secondary | ICD-10-CM

## 2021-09-02 DIAGNOSIS — I2584 Coronary atherosclerosis due to calcified coronary lesion: Secondary | ICD-10-CM

## 2021-09-02 DIAGNOSIS — R079 Chest pain, unspecified: Secondary | ICD-10-CM

## 2021-09-02 MED ORDER — METOPROLOL TARTRATE 100 MG PO TABS: ORAL_TABLET | ORAL | 0 refills | Status: DC

## 2021-09-02 NOTE — Telephone Encounter (Signed)
I have reached out to Ortho, and advised that I was faxing the placard over. And to please call me with any concerns.

## 2021-09-02 NOTE — Patient Instructions (Signed)
Medication Instructions:  Take Metoprolol 100 mg two hours before CT when scheduled.   *If you need a refill on your cardiac medications before your next appointment, please call your pharmacy*   Lab Work: BMET today   If you have labs (blood work) drawn today and your tests are completely normal, you will receive your results only by: [place] (if you have MyChart) OR A paper copy in the mail If you have any lab test that is abnormal or we need to change your treatment, we will call you to review the results.   Testing/Procedures: Your physician has requested that you have cardiac CT. Cardiac computed tomography (CT) is a painless test that uses an x-ray machine to take clear, detailed pictures of your heart. For further information please visit [email or web address]. Please follow instruction sheet as given.   Follow-Up: At [facility], you and your health needs are our priority.  As part of our continuing mission to provide you with exceptional heart care, we have created designated Provider Care Teams.  These Care Teams include your primary Cardiologist (physician) and Advanced Practice Providers (APPs -  Physician Assistants and Nurse Practitioners) who all work together to provide you with the care you need, when you need it.  We recommend signing up for the patient portal called "MyChart".  Sign up information is provided on this After Visit Summary.  MyChart is used to connect with patients for Virtual Visits (Telemedicine).  Patients are able to view lab/test results, encounter notes, upcoming appointments, etc.  Non-urgent messages can be sent to your provider as well.   To learn more about what you can do with MyChart, go to [email or web address].    Your next appointment:   6 month(s)  The format for your next appointment:   In Person  Provider:   Evalina Field, MD     Other Instructions Referral to pulmonary- they will call you to set up an  appointment.    Your cardiac CT will be scheduled at one of the below locations:   [facility] [address] [place], [place] [address] [phone]  If scheduled at [facility], please arrive at the [facility] main entrance (entrance A) of [facility] 30 minutes prior to test start time. You can use the FREE valet parking offered at the main entrance (encouraged to control the heart rate for the test) Proceed to the [facility] Radiology Department (first floor) to check-in and test prep.  Please follow these instructions carefully (unless otherwise directed):   On the Night Before the Test: Be sure to Drink plenty of water. Do not consume any caffeinated/decaffeinated beverages or chocolate 12 hours prior to your test. Do not take any antihistamines 12 hours prior to your test.  On the Day of the Test: Drink plenty of water until 1 hour prior to the test. Do not eat any food 4 hours prior to the test. You may take your regular medications prior to the test.  Take metoprolol (Lopressor) two hours prior to test. HOLD Furosemide/Hydrochlorothiazide morning of the test. FEMALES- please wear underwire-free bra if available, avoid dresses & tight clothing      After the Test: Drink plenty of water. After receiving IV contrast, you may experience a mild flushed feeling. This is normal. On occasion, you may experience a mild rash up to 24 hours after the test. This is not dangerous. If this occurs, you can take Benadryl 25 mg and increase your  fluid intake. If you experience trouble breathing, this can be serious. If it is severe call 911 IMMEDIATELY. If it is mild, please call our office. If you take any of these medications: Glipizide/Metformin, Avandament, Glucavance, please do not take 48 hours after completing test unless otherwise instructed.  Please allow 2-4 weeks for scheduling of routine cardiac CTs. Some insurance companies require a  pre-authorization which may delay scheduling of this test.   For non-scheduling related questions, please contact the cardiac imaging nurse navigator should you have any questions/concerns: Marchia Bond, Cardiac Imaging Nurse Navigator Gordy Clement, Cardiac Imaging Nurse Navigator [facility] Direct Office Dial: [phone]   For scheduling needs, including cancellations and rescheduling, please call [place], [phone].

## 2021-09-03 LAB — BASIC METABOLIC PANEL
BUN/Creatinine Ratio: 16 (ref 12–28)
BUN: 15 mg/dL (ref 8–27)
CO2: 22 mmol/L (ref 20–29)
Calcium: 10 mg/dL (ref 8.7–10.3)
Chloride: 104 mmol/L (ref 96–106)
Creatinine, Ser: 0.96 mg/dL (ref 0.57–1.00)
Glucose: 77 mg/dL (ref 70–99)
Potassium: 4.5 mmol/L (ref 3.5–5.2)
Sodium: 145 mmol/L — ABNORMAL HIGH (ref 134–144)
eGFR: 67 mL/min/{1.73_m2} (ref >59)

## 2021-09-06 NOTE — Progress Notes (Signed)
[facility] [address], [place] [address]   CLINIC:  Medical Oncology/Hematology  CONSULT NOTE  Patient Care Team: Fayrene Helper, MD as PCP - General O'Neal, Cassie Freer, MD as PCP - Cardiology (Cardiology) Derek Jack, MD as Consulting Physician (Hematology)  CHIEF COMPLAINTS/PURPOSE OF CONSULTATION:  Evaluation of right adrenal nodules  HISTORY OF PRESENTING ILLNESS:  [name] 60 y.o. female is here because of evaluation of right adrenal nodules, at the request of Dr. Moshe Cipro.  Today she reports feeling fair. She reports a left sided abdominal lump following her back surgery in March, and she denies any associated pain. She has lost 13 pounds since March at which time she reports having n/v/d. She reports stable cough and SOB since 2013. She reports chronic occasional palpitations. She reports chronic n/v/d/c, although nausea has worsened recently. She has a history of left breast cancer form 2007. She underwent adrenalectomy and splenectomy on 02/16/2012. She reports occasional nausea in the mornings.   She currently smokes 1/2 ppd and has smoked for 45 years. 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 Her paternal grandmother had breast cancer.   MEDICAL HISTORY:  Past Medical History:  Diagnosis Date   Arthritis    Phreesia 10/14/2020   Blood transfusion without reported diagnosis    Phreesia 10/14/2020   Breast cancer ([place]) 2007   History of  XRT, onTamoxifen   Bruises easily    Cancer ([place])    Phreesia 10/14/2020   Depression    Depression    Phreesia 10/14/2020   Diabetes mellitus    prediabetic   Diabetes mellitus without complication ([place])    Phreesia 10/14/2020   GERD (gastroesophageal reflux disease)    Hypertriglyceridemia    Hypothyroidism    following chemo amnd radiaition for breast cancer, needed replacement short term   Hypothyroidism (acquired)    replaced x 1 year    IBS (irritable bowel syndrome)    Menieres disease    Controlled with triamterene    Migraines    Obesity    Palpitations    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[place])    Sleep apnea 2010   Problems with CPAP    SURGICAL HISTORY: Past Surgical History:  Procedure Laterality Date   ABDOMINAL EXPOSURE N/A 12/03/2020   Procedure: ABDOMINAL EXPOSURE;  Surgeon: Rosetta Posner, MD;  Location: [facility];  Service: Vascular;  Laterality: N/A;   ABDOMINAL HYSTERECTOMY  1998   Benign, fibroids   ADRENALECTOMY  02/16/2012   [facility], splemic trauma, 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   ANTERIOR LUMBAR FUSION N/A 12/03/2020   Procedure: Lumbar Four-Five Lumbar Five Sacral One Anterior lumbar interbody fusion;  Surgeon: Ashok Pall, MD;  Location: [facility];  Service: Neurosurgery;  Laterality: N/A;   APPENDECTOMY  06/12/08   BIOPSY  07/02/2020   Procedure: BIOPSY;  Surgeon: Harvel Quale, MD;  [facility];  Service: Gastroenterology;;   BIOPSY  01/22/2021   Procedure: BIOPSY;  Surgeon: Daneil Dolin, MD;  [facility];  Service: Endoscopy;;   BREAST LUMPECTOMY Left 2008   BREAST RECONSTRUCTION     Left reconstructive   BREAST SURGERY  2007   Left lumpectomy   BREAST SURGERY     Mammosite - right side   CHOLECYSTECTOMY  1985   COLONOSCOPY WITH PROPOFOL N/A 07/02/2020   Procedure: COLONOSCOPY WITH PROPOFOL;  Surgeon: Harvel Quale, MD;  [facility];  Service: Gastroenterology;  Laterality: N/A;  1045   COLONOSCOPY WITH PROPOFOL N/A 04/01/2021   Procedure: COLONOSCOPY WITH PROPOFOL;  Surgeon: Harvel Quale, MD;  [facility];  Service: Gastroenterology;  Laterality: N/A;  12:50   ESOPHAGEAL DILATION  12/19/2015   Procedure: ESOPHAGEAL DILATION;  Surgeon: Rogene Houston, MD;  [facility];  Service: Endoscopy;;   ESOPHAGOGASTRODUODENOSCOPY N/A 12/19/2015   Procedure: ESOPHAGOGASTRODUODENOSCOPY (EGD);  Surgeon: Rogene Houston, MD;   [facility];  Service: Endoscopy;  Laterality: N/A;  11:40   FLEXIBLE SIGMOIDOSCOPY N/A 01/22/2021   Procedure: FLEXIBLE SIGMOIDOSCOPY;  Surgeon: Daneil Dolin, MD;  [facility];  Service: Endoscopy;  Laterality: N/A;   HEMOSTASIS CLIP PLACEMENT  04/01/2021   Procedure: HEMOSTASIS CLIP PLACEMENT;  Surgeon: Harvel Quale, MD;  [facility];  Service: Gastroenterology;;  x2   POLYPECTOMY  07/02/2020   Procedure: POLYPECTOMY;  Surgeon: Montez Morita, Quillian Quince, MD;  [facility];  Service: Gastroenterology;;   POLYPECTOMY  04/01/2021   Procedure: POLYPECTOMY INTESTINAL;  Surgeon: Harvel Quale, MD;  [facility];  Service: Gastroenterology;;   SPLENECTOMY, TOTAL  2/37/6283   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   THYROIDECTOMY, PARTIAL  11/05/2010   Benign disease    SOCIAL HISTORY: Social History   Socioeconomic History   Marital status: Married    Spouse name: Alroy Dust   Number of children: 2   Years of education: Not on file   Highest education level: Not on file  Occupational History   Not on file  Tobacco Use   Smoking status: Every Day    Packs/day: 0.50    Years: 40.00    Pack years: 20.00    Types: Cigarettes   Smokeless tobacco: Never   Tobacco comments:    1/2 pack a day  Vaping Use   Vaping Use: Never used  Substance and Sexual Activity   Alcohol use: No    Alcohol/week: 0.0 standard drinks    Comment: Encouraged to quit smoking. She has tried the nicotone gum and patches but did not help   Drug use: No   Sexual activity: Yes    Birth control/protection: Surgical  Other Topics Concern   Not on file  Social History Narrative   Not on file   Social Determinants of Health   Financial Resource Strain: Medium Risk   Difficulty of Paying Living Expenses: Somewhat hard  Food Insecurity: No Food Insecurity   Worried About Charity fundraiser in the Last Year: Never true    Ran Out of Food in the Last Year: Never true  Transportation Needs: No Transportation Needs   Lack of Transportation (Medical): No   Lack of Transportation (Non-Medical): No  Physical Activity: Inactive   Days of Exercise per Week: 0 days   Minutes of Exercise per Session: 0 min  Stress: Stress Concern Present   Feeling of Stress : To some extent  Social Connections: Moderately Integrated   Frequency of Communication with Friends and Family: Three times a week   Frequency of Social Gatherings with Friends and Family: Three times a week   Attends Religious Services: More than 4 times per year   Active Member of Clubs or Organizations: No   Attends Archivist Meetings: Never   Marital Status: Married  Human resources officer Violence: Not At Risk   Fear of Current or Ex-Partner: No   Emotionally Abused: No   Physically Abused: No  Sexually Abused: No    FAMILY HISTORY: Family History  Problem Relation Age of Onset   Diabetes Mother    Arrhythmia Mother    Heart disease Father    Hyperlipidemia Father    Arrhythmia Father    Cancer Paternal Grandmother        Breast   Diabetes Sister    Diabetes Brother    Heart disease Brother     ALLERGIES:  is allergic to penicillins, metformin and related, wound dressing adhesive, empagliflozin, statins, and zetia [ezetimibe].  MEDICATIONS:  Current Outpatient Medications  Medication Sig Dispense Refill   aspirin 81 MG EC tablet Take 81 mg by mouth in the morning.     glipiZIDE (GLUCOTROL XL) 5 MG 24 hr tablet Take 5 mg by mouth daily.     glucose blood test strip See admin instructions.     insulin degludec (TRESIBA FLEXTOUCH) 200 UNIT/ML FlexTouch Pen Inject 24 Units into the skin at bedtime. 18 mL 3   Insulin Pen Needle (PEN NEEDLES) 31G X 6 MM MISC See admin instructions.     Lancets (ONETOUCH DELICA PLUS [id number]) MISC Use 4 lancets per day to check blood sugar 200 each 3   naproxen (NAPROSYN) 500 MG tablet TAKE 1  TABLET(500 MG) BY MOUTH TWICE DAILY WITH A MEAL 40 tablet 0   omeprazole (PRILOSEC) 40 MG capsule Take 1 capsule (40 mg total) by mouth daily. 90 capsule 1   ONETOUCH VERIO test strip USE AS DIRECTED FOUR TIMES DAILY 150 strip 5   rifaximin (XIFAXAN) 550 MG TABS tablet Take 550 mg by mouth 3 (three) times daily.     rosuvastatin (CRESTOR) 20 MG tablet Take 1 tablet (20 mg total) by mouth daily. 90 tablet 1   traZODone (DESYREL) 100 MG tablet TAKE 1 TABLET(100 MG) BY MOUTH AT BEDTIME 90 tablet 1   triamterene-hydrochlorothiazide (DYAZIDE) 37.5-25 MG capsule Take 1 capsule by mouth daily.     TRULICITY 1.5 XT/0.6YI SOPN INJECT 1.5MG SUBCUTANEOUSLY ONCE A WEEK 6 mL 2   UNABLE TO FIND CPAP tubing and supplies, face mask Dx: G47.30 1 each 0   venlafaxine XR (EFFEXOR-XR) 75 MG 24 hr capsule Take 1 capsule (75 mg total) by mouth daily with breakfast. TAKE 1 CAPSULE(75 MG) BY MOUTH DAILY 90 capsule 0   Vitamin D, Ergocalciferol, (DRISDOL) 1.25 MG (50000 UNIT) CAPS capsule Take 1 capsule (50,000 Units total) by mouth once a week. 12 capsule 0   CLOTRIMAZOLE EX Apply 1 application topically daily as needed (itching an breast). (Patient not taking: Reported on 09/07/2021)     cyclobenzaprine (FLEXERIL) 10 MG tablet TAKE 1 TABLET BY MOUTH EVERY DAY AT BEDTIME AS NEEDED FOR MUSCLE SPASM (Patient not taking: Reported on 09/07/2021) 30 tablet 4   LORazepam (ATIVAN) 1 MG tablet Take 1 mg by mouth 2 (two) times daily as needed (Nausea). (Patient not taking: Reported on 09/07/2021)     temazepam (RESTORIL) 15 MG capsule Take 1 capsule (15 mg total) by mouth at bedtime as needed for sleep. (Patient not taking: Reported on 09/07/2021) 90 capsule 1   No current facility-administered medications for this visit.    REVIEW OF SYSTEMS:   Review of Systems  Constitutional:  Positive for fatigue (40%) and unexpected weight change (-13 lbs). Negative for appetite change.  Respiratory:  Positive for cough and shortness of  breath.   Cardiovascular:  Positive for palpitations.  Gastrointestinal:  Positive for constipation, diarrhea, nausea and vomiting.  Musculoskeletal:  Positive for  arthralgias (5/10 leg).  Neurological:  Positive for numbness.  Psychiatric/Behavioral:  Positive for depression.   All other systems reviewed and are negative.   PHYSICAL EXAMINATION: ECOG PERFORMANCE STATUS: 1 - Symptomatic but completely ambulatory  Vitals:   09/07/21 1015  BP: 108/68  Pulse: 97  Resp: 18  Temp: (!) 97.3 F (36.3 C)  SpO2: 100%   Filed Weights   09/07/21 1015  Weight: 199 lb 6.4 oz (90.4 kg)   Physical Exam Vitals reviewed.  Constitutional:      Appearance: Normal appearance. She is obese.  Cardiovascular:     Rate and Rhythm: Normal rate and regular rhythm.     Pulses: Normal pulses.     Heart sounds: Normal heart sounds.  Pulmonary:     Effort: Pulmonary effort is normal.     Breath sounds: Normal breath sounds.  Abdominal:     General: A surgical scar is present.     Palpations: Abdomen is soft. There is no hepatomegaly, splenomegaly or mass.     Tenderness: There is abdominal tenderness.     Hernia: A hernia is present.     Comments: L lateral surgical scar  Musculoskeletal:     Right lower leg: No edema.     Left lower leg: No edema.  Lymphadenopathy:     Upper Body:     Right upper body: No axillary or pectoral adenopathy.     Left upper body: No axillary or pectoral adenopathy.     Lower Body: No right inguinal adenopathy. No left inguinal adenopathy.  Neurological:     General: No focal deficit present.     Mental Status: She is alert and oriented to person, place, and time.  Psychiatric:        Mood and Affect: Mood normal.        Behavior: Behavior normal.     LABORATORY DATA:  I have reviewed the data as listed CBC Latest Ref Rng & Units 01/23/2021 01/20/2021 01/17/2021  WBC 4.0 - 10.5 K/uL 10.1 12.0(H) 12.7(H)  Hemoglobin 12.0 - 15.0 g/dL 12.2 12.8 14.9   Hematocrit 36.0 - 46.0 % 37.4 39.4 43.8  Platelets 150 - 400 K/uL 303 314 350   CMP Latest Ref Rng & Units 09/02/2021 06/15/2021 03/23/2021  Glucose 70 - 99 mg/dL 77 90 84  BUN 8 - 27 mg/dL _0 Creatinine 0.57 - 1.00 mg/dL 0.96 1.03(H) 1.01(H)  Sodium 134 - 144 mmol/L 145(H) 142 136  Potassium 3.5 - 5.2 mmol/L 4.5 4.7 4.2  Chloride 96 - 106 mmol/L 104 100 100  CO2 20 - 29 mmol/L _1 Calcium 8.7 - 10.3 mg/dL 10.0 9.7 9.5  Total Protein 6.0 - 8.5 g/dL - 7.3 -  Total Bilirubin 0.0 - 1.2 mg/dL - 0.5 -  Alkaline Phos 44 - 121 IU/L - 85 -  AST 0 - 40 IU/L - 20 -  ALT 0 - 32 IU/L - 15 -    RADIOGRAPHIC STUDIES: I have personally reviewed the radiological images as listed and agreed with the findings in the report. No results found.  ASSESSMENT:  Right adrenal nodules: - CT chest on 04/17/2021 showed 2 right adrenal nodules, measuring 2.6 x 1.7 cm, 3.0 x 1.7 cm compatible with adrenal adenomas. 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 - CT abdomen in May 2013 showed 1.5 cm right adrenal nodule. - She reportedly lost  about 15 pounds since March of this year when she was hospitalized with vomiting diarrhea and dehydration.   Social/family history: - Lives at home with her husband. 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 - She smokes half pack per day for 45 years. - Paternal grandmother had breast cancer.  3.  Stage I left breast cancer, ER positive, HER2 positive: - Diagnosed on 12/10/2005, status post lumpectomy. - Taxotere, Cytoxan plus Herceptin for 6 cycles followed by 1 year of Herceptin. - 5 years of tamoxifen completed November 2012.   PLAN:  Right adrenal nodules: - Incidental right adrenal nodules on recent CT of the chest.  They are consistent with the benign adenomas based on Hounsfield units. - Recommend further work-up to see if they are functioning. -  Check serum cortisol, ACTH, plasma and 24-hour urine normetanephrine's, metanephrines and catecholamines.  2.  JAK2 and BCR/ABL negative leukocytosis: - Thought to be from combination of smoking and a splenectomy.  3.  Left breast cancer: - Mammogram on 11/05/2020 was BI-RADS Category 1.   All questions were answered. The patient knows to call the clinic with any problems, questions or concerns.   Derek Jack, MD, 09/07/21 11:14 AM  [facility] [phone]   I, Thana Ates, am acting as a scribe for Dr. Derek Jack.  I, Derek Jack MD, have reviewed the above documentation for accuracy and completeness, and I agree with the above.

## 2021-09-07 ENCOUNTER — Ambulatory Visit: Payer: BC Managed Care – PPO

## 2021-09-07 ENCOUNTER — Encounter: Payer: Self-pay | Admitting: Orthopedic Surgery

## 2021-09-07 ENCOUNTER — Other Ambulatory Visit: Payer: Self-pay

## 2021-09-07 ENCOUNTER — Inpatient Hospital Stay (HOSPITAL_COMMUNITY): Payer: BC Managed Care – PPO | Attending: Hematology | Admitting: Hematology

## 2021-09-07 ENCOUNTER — Inpatient Hospital Stay (HOSPITAL_COMMUNITY): Payer: BC Managed Care – PPO

## 2021-09-07 ENCOUNTER — Ambulatory Visit (INDEPENDENT_AMBULATORY_CARE_PROVIDER_SITE_OTHER): Payer: BC Managed Care – PPO | Admitting: Orthopedic Surgery

## 2021-09-07 VITALS — BP 108/68 | HR 97 | Temp 97.3°F | Resp 18 | Ht 66.0 in | Wt 199.4 lb

## 2021-09-07 DIAGNOSIS — F32A Depression, unspecified: Secondary | ICD-10-CM | POA: Diagnosis not present

## 2021-09-07 DIAGNOSIS — E119 Type 2 diabetes mellitus without complications: Secondary | ICD-10-CM | POA: Insufficient documentation

## 2021-09-07 DIAGNOSIS — K589 Irritable bowel syndrome without diarrhea: Secondary | ICD-10-CM | POA: Diagnosis not present

## 2021-09-07 DIAGNOSIS — F17210 Nicotine dependence, cigarettes, uncomplicated: Secondary | ICD-10-CM | POA: Insufficient documentation

## 2021-09-07 DIAGNOSIS — R10819 Abdominal tenderness, unspecified site: Secondary | ICD-10-CM | POA: Diagnosis not present

## 2021-09-07 DIAGNOSIS — R0602 Shortness of breath: Secondary | ICD-10-CM | POA: Diagnosis not present

## 2021-09-07 DIAGNOSIS — M2550 Pain in unspecified joint: Secondary | ICD-10-CM | POA: Insufficient documentation

## 2021-09-07 DIAGNOSIS — E278 Other specified disorders of adrenal gland: Secondary | ICD-10-CM | POA: Insufficient documentation

## 2021-09-07 DIAGNOSIS — R002 Palpitations: Secondary | ICD-10-CM | POA: Diagnosis not present

## 2021-09-07 DIAGNOSIS — G4730 Sleep apnea, unspecified: Secondary | ICD-10-CM | POA: Insufficient documentation

## 2021-09-07 DIAGNOSIS — K5900 Constipation, unspecified: Secondary | ICD-10-CM | POA: Insufficient documentation

## 2021-09-07 DIAGNOSIS — E669 Obesity, unspecified: Secondary | ICD-10-CM | POA: Diagnosis not present

## 2021-09-07 DIAGNOSIS — W19XXXA Unspecified fall, initial encounter: Secondary | ICD-10-CM | POA: Diagnosis not present

## 2021-09-07 DIAGNOSIS — R112 Nausea with vomiting, unspecified: Secondary | ICD-10-CM | POA: Insufficient documentation

## 2021-09-07 DIAGNOSIS — R197 Diarrhea, unspecified: Secondary | ICD-10-CM | POA: Diagnosis not present

## 2021-09-07 DIAGNOSIS — K219 Gastro-esophageal reflux disease without esophagitis: Secondary | ICD-10-CM | POA: Diagnosis not present

## 2021-09-07 DIAGNOSIS — M25562 Pain in left knee: Secondary | ICD-10-CM

## 2021-09-07 DIAGNOSIS — Z79899 Other long term (current) drug therapy: Secondary | ICD-10-CM | POA: Diagnosis not present

## 2021-09-07 DIAGNOSIS — Z9081 Acquired absence of spleen: Secondary | ICD-10-CM | POA: Insufficient documentation

## 2021-09-07 DIAGNOSIS — Z8249 Family history of ischemic heart disease and other diseases of the circulatory system: Secondary | ICD-10-CM | POA: Insufficient documentation

## 2021-09-07 DIAGNOSIS — Z888 Allergy status to other drugs, medicaments and biological substances status: Secondary | ICD-10-CM | POA: Insufficient documentation

## 2021-09-07 DIAGNOSIS — Z86018 Personal history of other benign neoplasm: Secondary | ICD-10-CM | POA: Diagnosis not present

## 2021-09-07 DIAGNOSIS — Z853 Personal history of malignant neoplasm of breast: Secondary | ICD-10-CM | POA: Insufficient documentation

## 2021-09-07 DIAGNOSIS — Z8349 Family history of other endocrine, nutritional and metabolic diseases: Secondary | ICD-10-CM | POA: Insufficient documentation

## 2021-09-07 DIAGNOSIS — R059 Cough, unspecified: Secondary | ICD-10-CM | POA: Insufficient documentation

## 2021-09-07 DIAGNOSIS — Z880 Allergy status to penicillin: Secondary | ICD-10-CM | POA: Diagnosis not present

## 2021-09-07 DIAGNOSIS — Z833 Family history of diabetes mellitus: Secondary | ICD-10-CM | POA: Insufficient documentation

## 2021-09-07 DIAGNOSIS — R5383 Other fatigue: Secondary | ICD-10-CM | POA: Insufficient documentation

## 2021-09-07 DIAGNOSIS — M7652 Patellar tendinitis, left knee: Secondary | ICD-10-CM

## 2021-09-07 DIAGNOSIS — Z803 Family history of malignant neoplasm of breast: Secondary | ICD-10-CM | POA: Insufficient documentation

## 2021-09-07 DIAGNOSIS — Z9049 Acquired absence of other specified parts of digestive tract: Secondary | ICD-10-CM | POA: Insufficient documentation

## 2021-09-07 DIAGNOSIS — R1900 Intra-abdominal and pelvic swelling, mass and lump, unspecified site: Secondary | ICD-10-CM | POA: Insufficient documentation

## 2021-09-07 DIAGNOSIS — R200 Anesthesia of skin: Secondary | ICD-10-CM | POA: Insufficient documentation

## 2021-09-07 DIAGNOSIS — Z596 Low income: Secondary | ICD-10-CM | POA: Insufficient documentation

## 2021-09-07 LAB — CORTISOL: Cortisol, Plasma: 6.6 ug/dL

## 2021-09-07 NOTE — Patient Instructions (Signed)

## 2021-09-07 NOTE — Patient Instructions (Addendum)
[place] at [facility] Discharge Instructions  You were seen and examined today by Dr. Delton Coombes. Dr. Delton Coombes is a medical oncologist, meaning that he specializes in the management of cancer diagnoses with medications. Dr. Delton Coombes discussed your past medical history, family history of cancers, and the events that led to you being here today.  You were referred to Dr. Delton Coombes due to a new adrenal nodule that was noted on a recent CT scan. It appears to be the same type of nodules that were present on your left adrenal gland. Dr. Delton Coombes will order blood and urine tests to see if there are any additional chemicals being produced by these nodules.  Follow-up as scheduled.   Thank you for choosing [facility] at [facility] to provide your oncology and hematology care.  To afford each patient quality time with our provider, please arrive at least 15 minutes before your scheduled appointment time.   If you have a lab appointment with the [facility] please come in thru the Main Entrance and check in at the main information desk.  You need to re-schedule your appointment should you arrive 10 or more minutes late.  We strive to give you quality time with our providers, and arriving late affects you and other patients whose appointments are after yours.  Also, if you no show three or more times for appointments you may be dismissed from the clinic at the providers discretion.     Again, thank you for choosing [facility].  Our hope is that these requests will decrease the amount of time that you wait before being seen by our physicians.       _____________________________________________________________  Should you have questions after your visit to [facility], please contact our office at [phone] and follow the prompts.  Our office hours are 8:00 a.m. and 4:30 p.m. Monday - Friday.  Please note that voicemails  left after 4:00 p.m. may not be returned until the following business day.  We are closed weekends and major holidays.  You do have access to a nurse 24-7, just call the main number to the clinic [phone] and do not press any options, hold on the line and a nurse will answer the phone.    For prescription refill requests, have your pharmacy contact our office and allow 72 hours.    Due to Covid, you will need to wear a mask upon entering the hospital. If you do not have a mask, a mask will be given to you at the Main Entrance upon arrival. For doctor visits, patients may have 1 support person age 28 or older with them. For treatment visits, patients can not have anyone with them due to social distancing guidelines and our immunocompromised population.

## 2021-09-07 NOTE — Progress Notes (Signed)
New Patient Visit  Assessment: Annette [name] is a 60 y.o. female with the following: 1. Patellar tendinitis of left knee; associated bone bruising  Plan: Patient has pain over the anterior left knee, with some visible bruising.  Radiographs demonstrate very mild degenerative changes.  No acute injuries.  Her knee is otherwise stable.  No tenderness along either joint line.  At this point, her pain is most consistent with irritation of the patellar tendon, and some associated bruising of the tibial tuberosity.  We discussed the possibility that bone bruises can take a couple of months to fully heal.  I do anticipate gradual improvement in her pain.  I stressed the importance of maintaining full range of motion of the left knee, and provided her with simple knee exercises.  She can take over-the-counter pain medications as needed, and can also use Voltaren gel and ice for the pain.  No further follow-up is needed.  She struggles, we could consider a referral to physical therapy.  In regards to the lower back pain, I am not concerned about nerve compression, as the pain is localized to the lower back, buttock area.   Follow-up: Return if symptoms worsen or fail to improve.  Subjective:  Chief Complaint  Patient presents with   Knee Pain    LT knee s/p fall DOI 08/18/21   Hip Pain    LT    History of Present Illness: Annette [name] is a 60 y.o. female who has been referred by [name] for evaluation of left knee pain.  She states that she has had multiple falls in the past couple of months.  Last fall was approximately 1 month ago.  She stumbled and fell directly onto the anterior aspect of her left knee.  She noted pain, bruising and swelling at that time.  The pain and swelling has gradually improved, but is still tender to palpation.  She is not taking medication for this pain.  No previous therapy.  She is never had an injection in her left knee.  No prior injuries to the left  knee.  She also has pain in the left lower back, and buttock area.  No radiating pains down the posterior aspect of her leg.  She states she does not like to take medications.  She does have some Voltaren gel, but she has not been using this.   Review of Systems: No fevers or chills No numbness or tingling No chest pain No shortness of breath No bowel or bladder dysfunction No GI distress No headaches   Medical History:  Past Medical History:  Diagnosis Date   Arthritis    Phreesia 10/14/2020   Blood transfusion without reported diagnosis    Phreesia 10/14/2020   Breast cancer ([place]) 2007   History of  XRT, onTamoxifen   Bruises easily    Cancer ([place])    Phreesia 10/14/2020   Depression    Depression    Phreesia 10/14/2020   Diabetes mellitus    prediabetic   Diabetes mellitus without complication ([place])    Phreesia 10/14/2020   GERD (gastroesophageal reflux disease)    Hypertriglyceridemia    Hypothyroidism    following chemo amnd radiaition for breast cancer, needed replacement short term   Hypothyroidism (acquired)    replaced x 1 year   IBS (irritable bowel syndrome)    Menieres disease    Controlled with triamterene    Migraines    Obesity    Palpitations    Pituitary  insufficiency ([place])    Sleep apnea 2010   Problems with CPAP    Past Surgical History:  Procedure Laterality Date   ABDOMINAL EXPOSURE N/A 12/03/2020   Procedure: ABDOMINAL EXPOSURE;  Surgeon: Rosetta Posner, MD;  Location: [facility];  Service: Vascular;  Laterality: N/A;   ABDOMINAL HYSTERECTOMY  1998   Benign, fibroids   ADRENALECTOMY  02/16/2012   [facility], splemic trauma, 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   ANTERIOR LUMBAR FUSION N/A 12/03/2020   Procedure: Lumbar Four-Five Lumbar Five Sacral One Anterior lumbar interbody fusion;  Surgeon: Ashok Pall, MD;  Location: [facility];  Service: Neurosurgery;  Laterality: N/A;   APPENDECTOMY  06/12/08   BIOPSY  07/02/2020   Procedure: BIOPSY;  Surgeon: Harvel Quale, MD;  [facility];  Service: Gastroenterology;;   BIOPSY  01/22/2021   Procedure: BIOPSY;  Surgeon: Daneil Dolin, MD;  [facility];  Service: Endoscopy;;   BREAST LUMPECTOMY Left 2008   BREAST RECONSTRUCTION     Left reconstructive   BREAST SURGERY  2007   Left lumpectomy   BREAST SURGERY     Mammosite - right side   CHOLECYSTECTOMY  1985   COLONOSCOPY WITH PROPOFOL N/A 07/02/2020   Procedure: COLONOSCOPY WITH PROPOFOL;  Surgeon: Harvel Quale, MD;  [facility];  Service: Gastroenterology;  Laterality: N/A;  1045   COLONOSCOPY WITH PROPOFOL N/A 04/01/2021   Procedure: COLONOSCOPY WITH PROPOFOL;  Surgeon: Harvel Quale, MD;  [facility];  Service: Gastroenterology;  Laterality: N/A;  12:50   ESOPHAGEAL DILATION  12/19/2015   Procedure: ESOPHAGEAL DILATION;  Surgeon: Rogene Houston, MD;  [facility];  Service: Endoscopy;;   ESOPHAGOGASTRODUODENOSCOPY N/A 12/19/2015   Procedure: ESOPHAGOGASTRODUODENOSCOPY (EGD);  Surgeon: Rogene Houston, MD;  [facility];  Service: Endoscopy;  Laterality: N/A;  11:40   FLEXIBLE SIGMOIDOSCOPY N/A 01/22/2021   Procedure: FLEXIBLE SIGMOIDOSCOPY;  Surgeon: Daneil Dolin, MD;  [facility];  Service: Endoscopy;  Laterality: N/A;   HEMOSTASIS CLIP PLACEMENT  04/01/2021   Procedure: HEMOSTASIS CLIP PLACEMENT;  Surgeon: Harvel Quale, MD;  [facility];  Service: Gastroenterology;;  x2   POLYPECTOMY  07/02/2020   Procedure: POLYPECTOMY;  Surgeon: Montez Morita, Quillian Quince, MD;  [facility];  Service: Gastroenterology;;   POLYPECTOMY  04/01/2021   Procedure: POLYPECTOMY INTESTINAL;  Surgeon: Harvel Quale, MD;  [facility];  Service: Gastroenterology;;   SPLENECTOMY, TOTAL  0/94/7096   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   THYROIDECTOMY, PARTIAL  11/05/2010   Benign disease     Family History  Problem Relation Age of Onset   Diabetes Mother    Arrhythmia Mother    Heart disease Father    Hyperlipidemia Father    Arrhythmia Father    Cancer Paternal Grandmother        Breast   Diabetes Sister    Diabetes Brother    Heart disease Brother    Social History   Tobacco Use   Smoking status: Every Day    Packs/day: 0.50    Years: 40.00    Pack years: 20.00    Types: Cigarettes   Smokeless tobacco: Never   Tobacco comments:    1/2 pack a day  Vaping Use   Vaping Use: Never used  Substance Use Topics   Alcohol use: No    Alcohol/week: 0.0 standard drinks    Comment: Encouraged to quit  smoking. She has tried the nicotone gum and patches but did not help   Drug use: No    Allergies  Allergen Reactions   Penicillins Swelling    Has patient had a PCN reaction causing immediate rash, facial/tongue/throat swelling, SOB or lightheadedness with hypotension: Yes Has patient had a PCN reaction causing severe rash involving mucus membranes or skin necrosis: No Has patient had a PCN reaction that required hospitalization: No  Has patient had a PCN reaction occurring within the last 10 years: No If all of the above answers are "NO", then may proceed with Cephalosporin use.   Of face and tongue.   Metformin And Related Diarrhea and Nausea Only    Pt to discontinue med due to intolerance, cramps and diarrhea   Wound Dressing Adhesive Itching and Rash    Dermabond and other surgical glues for incisions   Empagliflozin Other (See Comments)    Yeast infection  Jardiance   Statins Other (See Comments)    Fell bad but on Crestor   Zetia [Ezetimibe] Other (See Comments)    Feels funny    Current Meds  Medication Sig   aspirin 81 MG EC tablet Take 81 mg by mouth in the morning.   CLOTRIMAZOLE EX Apply 1 application topically daily as needed (itching an breast).   cyclobenzaprine (FLEXERIL) 10 MG tablet TAKE 1 TABLET BY MOUTH EVERY DAY AT BEDTIME AS  NEEDED FOR MUSCLE SPASM   glipiZIDE (GLUCOTROL XL) 5 MG 24 hr tablet Take 5 mg by mouth daily.   glucose blood test strip See admin instructions.   insulin degludec (TRESIBA FLEXTOUCH) 200 UNIT/ML FlexTouch Pen Inject 24 Units into the skin at bedtime.   Insulin Pen Needle (PEN NEEDLES) 31G X 6 MM MISC See admin instructions.   Lancets (ONETOUCH DELICA PLUS [id number]) MISC Use 4 lancets per day to check blood sugar   LORazepam (ATIVAN) 1 MG tablet Take 1 mg by mouth 2 (two) times daily as needed (Nausea).   naproxen (NAPROSYN) 500 MG tablet TAKE 1 TABLET(500 MG) BY MOUTH TWICE DAILY WITH A MEAL   omeprazole (PRILOSEC) 40 MG capsule Take 1 capsule (40 mg total) by mouth daily.   ONETOUCH VERIO test strip USE AS DIRECTED FOUR TIMES DAILY   rosuvastatin (CRESTOR) 20 MG tablet Take 1 tablet (20 mg total) by mouth daily.   temazepam (RESTORIL) 15 MG capsule Take 1 capsule (15 mg total) by mouth at bedtime as needed for sleep.   traZODone (DESYREL) 100 MG tablet TAKE 1 TABLET(100 MG) BY MOUTH AT BEDTIME   triamterene-hydrochlorothiazide (DYAZIDE) 37.5-25 MG capsule Take 1 capsule by mouth daily.   TRULICITY 1.5 AY/3.0ZS SOPN INJECT 1.5MG  SUBCUTANEOUSLY ONCE A WEEK   UNABLE TO FIND CPAP tubing and supplies, face mask Dx: G47.30   venlafaxine XR (EFFEXOR-XR) 75 MG 24 hr capsule Take 1 capsule (75 mg total) by mouth daily with breakfast. TAKE 1 CAPSULE(75 MG) BY MOUTH DAILY   Vitamin D, Ergocalciferol, (DRISDOL) 1.25 MG (50000 UNIT) CAPS capsule Take 1 capsule (50,000 Units total) by mouth once a week.    Objective: There were no vitals taken for this visit.  Physical Exam:  General: Alert and oriented. and No acute distress. Gait: Left sided antalgic gait.  Evaluation of left knee demonstrates some ecchymosis over the anterior knee.  She has tenderness to palpation over the distal patella, as well as over the tibial tuberosity.  She is able to achieve full extension, when she relaxes.  She can  get 120 degrees of flexion.  No effusion seen on exam today.  No tenderness to palpation along the medial or lateral joint lines.  No increased laxity to varus or valgus stress.  Negative Lachman.  IMAGING: I personally ordered and reviewed the following images  X-ray of the left knee was obtained in clinic today.  No acute injuries are noted.  Mild varus alignment overall.  Well-maintained joint space within all 3 compartments.  Mild loss of joint space within the medial compartment.  No associated osteophytes.  No subchondral sclerosis.  Impression: Normal-appearing left knee x-rays, with mild varus alignment overall.   New Medications:  No orders of the defined types were placed in this encounter.     Mordecai Rasmussen, MD  09/07/2021 2:19 PM

## 2021-09-08 LAB — ACTH: C206 ACTH: 4.2 pg/mL — ABNORMAL LOW (ref 7.2–63.3)

## 2021-09-09 ENCOUNTER — Other Ambulatory Visit (HOSPITAL_COMMUNITY): Payer: Self-pay | Admitting: *Deleted

## 2021-09-09 ENCOUNTER — Other Ambulatory Visit (HOSPITAL_COMMUNITY): Payer: Self-pay

## 2021-09-09 DIAGNOSIS — E278 Other specified disorders of adrenal gland: Secondary | ICD-10-CM

## 2021-09-09 DIAGNOSIS — D72829 Elevated white blood cell count, unspecified: Secondary | ICD-10-CM

## 2021-09-09 LAB — CATECHOLAMINES, FRACTIONATED, PLASMA
Dopamine: 40 pg/mL (ref 0–48)
Epinephrine: 19 pg/mL (ref 0–62)
Norepinephrine: 745 pg/mL (ref 0–874)

## 2021-09-09 LAB — METANEPHRINES, PLASMA
Metanephrine, Free: 10.9 pg/mL (ref 0.0–88.0)
Normetanephrine, Free: 106.9 pg/mL (ref 0.0–285.2)

## 2021-09-11 ENCOUNTER — Ambulatory Visit (INDEPENDENT_AMBULATORY_CARE_PROVIDER_SITE_OTHER): Payer: BC Managed Care – PPO | Admitting: Internal Medicine

## 2021-09-11 ENCOUNTER — Other Ambulatory Visit: Payer: Self-pay

## 2021-09-11 ENCOUNTER — Encounter: Payer: Self-pay | Admitting: Internal Medicine

## 2021-09-11 VITALS — BP 98/60 | HR 60 | Ht 66.0 in | Wt 200.0 lb

## 2021-09-11 DIAGNOSIS — E669 Obesity, unspecified: Secondary | ICD-10-CM | POA: Diagnosis not present

## 2021-09-11 DIAGNOSIS — E782 Mixed hyperlipidemia: Secondary | ICD-10-CM

## 2021-09-11 DIAGNOSIS — Z794 Long term (current) use of insulin: Secondary | ICD-10-CM | POA: Diagnosis not present

## 2021-09-11 DIAGNOSIS — E1165 Type 2 diabetes mellitus with hyperglycemia: Secondary | ICD-10-CM

## 2021-09-11 MED ORDER — TRESIBA FLEXTOUCH 200 UNIT/ML ~~LOC~~ SOPN: 26.0000 [IU] | PEN_INJECTOR | Freq: Every day | SUBCUTANEOUS | 3 refills | Status: DC

## 2021-09-11 MED ORDER — TRULICITY 1.5 MG/0.5ML ~~LOC~~ SOAJ: SUBCUTANEOUS | 3 refills | Status: DC

## 2021-09-11 MED ORDER — ONETOUCH VERIO VI STRP: ORAL_STRIP | 3 refills | Status: DC

## 2021-09-11 NOTE — Progress Notes (Signed)
Patient ID: Annette [name], female   DOB: December 08, 1960, 60 y.o.   MRN: [id number]   This visit occurred during the SARS-CoV-2 public health emergency.  Safety protocols were in place, including screening questions prior to the visit, additional usage of staff PPE, and extensive cleaning of exam room while observing appropriate contact time as indicated for disinfecting solutions.   HPI: Annette [name] is a 60 y.o.-year-old female, initially referred by her PCP, Dr. Moshe Cipro, returning for follow-up for DM2, dx in 2018, insulin-dependent since 03/2019, uncontrolled, without long-term complications.  She previously saw Dr. Dorris Fetch and Dr. Chalmers Cater but then switched to seeing me.  Last visit 4 months ago.  He is here with her husband.  Interim history: She had back surgery (12/2020).  She developed significant leg pain afterwards and actually presented to the emergency room 3 times because of this before last visit.  She was on opiates at that time.  She was not eating well and lost weight.  She continues to have leg pain now.   She has nausea and blurry vision and also dizziness >> improved. She was recently found to have 2 adrenal adenomas, which is investigated by oncology.  They had negative Hounsfield units on CT, consistent with benign nodules.  Reviewed HbA1c levels: Lab Results  Component Value Date   HGBA1C 5.9 (A) 05/19/2021   HGBA1C 6.0 (H) 01/17/2021   HGBA1C 6.4 (H) 12/03/2020   HGBA1C 6.6 (A) 09/29/2020   HGBA1C 7.0 (A) 06/24/2020   HGBA1C 9.4 (A) 03/11/2020   HGBA1C 7.5 (H) 07/25/2019   HGBA1C 8.3 (H) 03/12/2019   HGBA1C 8.4 (H) 11/21/2018   HGBA1C 8.0 (H) 08/08/2018   Previously on: - Glipizide XL 20 mg before breakfast - Lantus 50 units at bedtime (dose increased 04/08/2020) She could not tolerate Metformin due to GI intolerance - abdominal pain and diarrhea. She had vaginal yeast infections and UTIs from [place]. Previously on Tresiba, but unclear why changed to  Lantus.  Currently on: - Trulicity 4.09 >> 1.5 mg weekly - started 04/2020 -tolerated well - Tresiba 50 units at bedtime  >> Toujeo 50 >> 56 units at bedtime >> Tresiba 50 >> 30-46 >> 24 >> 24-30 units daily We stopped glipizide XL 01/2021.  Pt checks her sugars twice a day: - am:  62, 79-141 >> 43, 55-99, 162 (steroid) >> 80-112 - 2h after b'fast: n/c - before lunch: n/c >> 120 >> n/c - 2h after lunch: n/c >> 64 >> n/c - before dinner:  60-144 >> 59-152, 162 >> 67, 77-127, 148 - 2h after dinner: n/c >> 133, 143, 177 >> 106-166 >> 125-150 - bedtime: n/c >> 94 >> n/c - nighttime: n/c Lowest sugar was 51 >> 58 >> 43 (am) >> 67; she has hypoglycemia awareness in the 90s.  Highest sugar was 425 ...>> 269 (steroid inj) >> 150.  Glucometer: One Touch verio  At last visit, she was eating only 1 meal a day, but now she eats ~2. She only drinks unsweetened tea and coffee.  -She has no history of CKD, last BUN/creatinine:  Lab Results  Component Value Date   BUN 15 09/02/2021   BUN 11 06/15/2021   CREATININE 0.96 09/02/2021   CREATININE 1.03 (H) 06/15/2021  Not on ACE inhibitor/ARB.  + HL; last set of lipids: Lab Results  Component Value Date   CHOL 149 06/15/2021   HDL 53 06/15/2021   LDLCALC 75 06/15/2021   TRIG 118 06/15/2021   CHOLHDL 2.8 06/15/2021  On Crestor 20. She was also on Zetia but she did not feel well on it >> stopped.  - last eye exam was in fall 2020: No DR reportedly.  She sometimes has double vision.  At last visit I recommended to see Dr. Prudencio Burly, but she did not see them yet.  -no numbness but some tingling in her feet.  Pt has FH of DM in mother, brother, sister.  She also has a history of HTN, OSA. She had COVID-19 10/2019. She recovered well afterwards. She also has a history of partial thyroidectomy and also adrenalectomy for benign adrenal nodule by Dr. Celine Ahr.  Reviewed latest TSH: Lab Results  Component Value Date   TSH 1.835 01/16/2021    ROS: + See HPI  I reviewed pt's medications, allergies, PMH, social hx, family hx, and changes were documented in the history of present illness. Otherwise, unchanged from my initial visit note.  Past Medical History:  Diagnosis Date   Arthritis    Phreesia 10/14/2020   Blood transfusion without reported diagnosis    Phreesia 10/14/2020   Breast cancer ([place]) 2007   History of  XRT, onTamoxifen   Bruises easily    Cancer ([place])    Phreesia 10/14/2020   Depression    Depression    Phreesia 10/14/2020   Diabetes mellitus    prediabetic   Diabetes mellitus without complication ([place])    Phreesia 10/14/2020   GERD (gastroesophageal reflux disease)    Hypertriglyceridemia    Hypothyroidism    following chemo amnd radiaition for breast cancer, needed replacement short term   Hypothyroidism (acquired)    replaced x 1 year   IBS (irritable bowel syndrome)    Menieres disease    Controlled with triamterene    Migraines    Obesity    Palpitations    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[place])    Sleep apnea 2010   Problems with CPAP   Past Surgical History:  Procedure Laterality Date   ABDOMINAL EXPOSURE N/A 12/03/2020   Procedure: ABDOMINAL EXPOSURE;  Surgeon: Rosetta Posner, MD;  Location: [facility];  Service: Vascular;  Laterality: N/A;   ABDOMINAL HYSTERECTOMY  1998   Benign, fibroids   ADRENALECTOMY  02/16/2012   [facility], splemic trauma, 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   ANTERIOR LUMBAR FUSION N/A 12/03/2020   Procedure: Lumbar Four-Five Lumbar Five Sacral One Anterior lumbar interbody fusion;  Surgeon: Ashok Pall, MD;  Location: [facility];  Service: Neurosurgery;  Laterality: N/A;   APPENDECTOMY  06/12/08   BIOPSY  07/02/2020   Procedure: BIOPSY;  Surgeon: Harvel Quale, MD;  [facility];  Service: Gastroenterology;;   BIOPSY  01/22/2021   Procedure: BIOPSY;  Surgeon: Daneil Dolin, MD;  [facility];  Service: Endoscopy;;   BREAST LUMPECTOMY Left 2008   BREAST  RECONSTRUCTION     Left reconstructive   BREAST SURGERY  2007   Left lumpectomy   BREAST SURGERY     Mammosite - right side   CHOLECYSTECTOMY  1985   COLONOSCOPY WITH PROPOFOL N/A 07/02/2020   Procedure: COLONOSCOPY WITH PROPOFOL;  Surgeon: Harvel Quale, MD;  [facility];  Service: Gastroenterology;  Laterality: N/A;  1045   COLONOSCOPY WITH PROPOFOL N/A 04/01/2021   Procedure: COLONOSCOPY WITH PROPOFOL;  Surgeon: Harvel Quale, MD;  [facility];  Service: Gastroenterology;  Laterality: N/A;  12:50   ESOPHAGEAL DILATION  12/19/2015   Procedure: ESOPHAGEAL DILATION;  Surgeon: Rogene Houston, MD;  [facility];  Service: Endoscopy;;   ESOPHAGOGASTRODUODENOSCOPY N/A 12/19/2015   Procedure: ESOPHAGOGASTRODUODENOSCOPY (EGD);  Surgeon: Rogene Houston, MD;  [facility];  Service: Endoscopy;  Laterality: N/A;  11:40   FLEXIBLE SIGMOIDOSCOPY N/A 01/22/2021   Procedure: FLEXIBLE SIGMOIDOSCOPY;  Surgeon: Daneil Dolin, MD;  [facility];  Service: Endoscopy;  Laterality: N/A;   HEMOSTASIS CLIP PLACEMENT  04/01/2021   Procedure: HEMOSTASIS CLIP PLACEMENT;  Surgeon: Harvel Quale, MD;  [facility];  Service: Gastroenterology;;  x2   POLYPECTOMY  07/02/2020   Procedure: POLYPECTOMY;  Surgeon: Montez Morita, Quillian Quince, MD;  [facility];  Service: Gastroenterology;;   POLYPECTOMY  04/01/2021   Procedure: POLYPECTOMY INTESTINAL;  Surgeon: Harvel Quale, MD;  [facility];  Service: Gastroenterology;;   SPLENECTOMY, TOTAL  5/46/2703   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   THYROIDECTOMY, PARTIAL  11/05/2010   Benign disease   Social History   Socioeconomic History   Marital status: Married    Spouse name: Alroy Dust   Number of children: 2   Years of education: Not on file   Highest education level: Not on file  Occupational History   Not on file   Tobacco Use   Smoking status: Every Day    Packs/day: 0.50    Years: 40.00    Pack years: 20.00    Types: Cigarettes   Smokeless tobacco: Never   Tobacco comments:    1/2 pack a day  Vaping Use   Vaping Use: Never used  Substance and Sexual Activity   Alcohol use: No    Alcohol/week: 0.0 standard drinks    Comment: Encouraged to quit smoking. She has tried the nicotone gum and patches but did not help   Drug use: No   Sexual activity: Yes    Birth control/protection: Surgical  Other Topics Concern   Not on file  Social History Narrative   Not on file   Social Determinants of Health   Financial Resource Strain: Medium Risk   Difficulty of Paying Living Expenses: Somewhat hard  Food Insecurity: No Food Insecurity   Worried About Charity fundraiser in the Last Year: Never true   Ran Out of Food in the Last Year: Never true  Transportation Needs: No Transportation Needs   Lack of Transportation (Medical): No   Lack of Transportation (Non-Medical): No  Physical Activity: Inactive   Days of Exercise per Week: 0 days   Minutes of Exercise per Session: 0 min  Stress: Stress Concern Present   Feeling of Stress : To some extent  Social Connections: Moderately Integrated   Frequency of Communication with Friends and Family: Three times a week   Frequency of Social Gatherings with Friends and Family: Three times a week   Attends Religious Services: More than 4 times per year   Active Member of Clubs or Organizations: No   Attends Archivist Meetings: Never   Marital Status: Married  Human resources officer Violence: Not At Risk   Fear of Current or Ex-Partner: No   Emotionally Abused: No   Physically Abused: No   Sexually Abused: No   Current Outpatient Medications on File Prior to Visit  Medication Sig Dispense Refill   aspirin 81 MG EC tablet Take 81 mg by mouth in the morning.     CLOTRIMAZOLE EX Apply 1 application topically daily as needed (itching an breast).      cyclobenzaprine (FLEXERIL) 10 MG  tablet TAKE 1 TABLET BY MOUTH EVERY DAY AT BEDTIME AS NEEDED FOR MUSCLE SPASM 30 tablet 4   glipiZIDE (GLUCOTROL XL) 5 MG 24 hr tablet Take 5 mg by mouth daily.     glucose blood test strip See admin instructions.     insulin degludec (TRESIBA FLEXTOUCH) 200 UNIT/ML FlexTouch Pen Inject 24 Units into the skin at bedtime. 18 mL 3   Insulin Pen Needle (PEN NEEDLES) 31G X 6 MM MISC See admin instructions.     Lancets (ONETOUCH DELICA PLUS ZOXWRU04V) MISC Use 4 lancets per day to check blood sugar 200 each 3   LORazepam (ATIVAN) 1 MG tablet Take 1 mg by mouth 2 (two) times daily as needed (Nausea).     naproxen (NAPROSYN) 500 MG tablet TAKE 1 TABLET(500 MG) BY MOUTH TWICE DAILY WITH A MEAL 40 tablet 0   omeprazole (PRILOSEC) 40 MG capsule Take 1 capsule (40 mg total) by mouth daily. 90 capsule 1   ONETOUCH VERIO test strip USE AS DIRECTED FOUR TIMES DAILY 150 strip 5   rosuvastatin (CRESTOR) 20 MG tablet Take 1 tablet (20 mg total) by mouth daily. 90 tablet 1   temazepam (RESTORIL) 15 MG capsule Take 1 capsule (15 mg total) by mouth at bedtime as needed for sleep. 90 capsule 1   traZODone (DESYREL) 100 MG tablet TAKE 1 TABLET(100 MG) BY MOUTH AT BEDTIME 90 tablet 1   triamterene-hydrochlorothiazide (DYAZIDE) 37.5-25 MG capsule Take 1 capsule by mouth daily.     TRULICITY 1.5 WU/9.8JX SOPN INJECT 1.5MG  SUBCUTANEOUSLY ONCE A WEEK 6 mL 2   UNABLE TO FIND CPAP tubing and supplies, face mask Dx: G47.30 1 each 0   venlafaxine XR (EFFEXOR-XR) 75 MG 24 hr capsule Take 1 capsule (75 mg total) by mouth daily with breakfast. TAKE 1 CAPSULE(75 MG) BY MOUTH DAILY 90 capsule 0   Vitamin D, Ergocalciferol, (DRISDOL) 1.25 MG (50000 UNIT) CAPS capsule Take 1 capsule (50,000 Units total) by mouth once a week. 12 capsule 0   No current facility-administered medications on file prior to visit.   Allergies  Allergen Reactions   Penicillins Swelling    Has patient had a PCN reaction  causing immediate rash, facial/tongue/throat swelling, SOB or lightheadedness with hypotension: Yes Has patient had a PCN reaction causing severe rash involving mucus membranes or skin necrosis: No Has patient had a PCN reaction that required hospitalization: No  Has patient had a PCN reaction occurring within the last 10 years: No If all of the above answers are "NO", then may proceed with Cephalosporin use.   Of face and tongue.   Metformin And Related Diarrhea and Nausea Only    Pt to discontinue med due to intolerance, cramps and diarrhea   Wound Dressing Adhesive Itching and Rash    Dermabond and other surgical glues for incisions   Empagliflozin Other (See Comments)    Yeast infection  Jardiance   Statins Other (See Comments)    Fell bad but on Crestor   Zetia [Ezetimibe] Other (See Comments)    Feels funny   Family History  Problem Relation Age of Onset   Diabetes Mother    Arrhythmia Mother    Heart disease Father    Hyperlipidemia Father    Arrhythmia Father    Cancer Paternal Grandmother        Breast   Diabetes Sister    Diabetes Brother    Heart disease Brother    PE: BP 98/60 (BP Location: Right Arm,  Patient Position: Sitting, Cuff Size: Normal)   Pulse 60   Ht 5\' 6"  (1.676 m)   Wt 200 lb (90.7 kg)   SpO2 97%   BMI 32.28 kg/m  Wt Readings from Last 3 Encounters:  09/11/21 200 lb (90.7 kg)  09/07/21 199 lb 6.4 oz (90.4 kg)  09/02/21 197 lb (89.4 kg)   Constitutional: overweight, in NAD Eyes: PERRLA, EOMI, no exophthalmos ENT: moist mucous membranes, no thyromegaly, no cervical lymphadenopathy Cardiovascular: RRR, No MRG Respiratory: CTA B Musculoskeletal: no deformities, strength intact in all 4 Skin: moist, warm, no rashes Neurological: no tremor with outstretched hands, DTR normal in all 4  ASSESSMENT: 1. DM2, insulin-dependent, uncontrolled, without long-term complications, but with hyperglycemia  No family history of medullary thyroid cancer  or personal history of pancreatitis.  2. HL  3.  Obesity class II  PLAN:  1. Patient with longstanding, uncontrolled, type 2 diabetes, on injectable antidiabetic regimen with long-acting insulin and weekly GLP-1 receptor agonist, with improved blood sugars more recently, to the point of lows, in the 50s and 60s.  We stopped her sulfonylurea at the previous visit, and at last visit, we decreased her Tresiba dose.  At that time her sugars were between 50s and 100 with only few exceptions higher than that.  I advised her to continue to reduce [place] every 3 to 5 days, as needed.  We continued the same Trulicity dose.  She tolerates this well.  HbA1c then was 5.9%, lower. -At today's visit, sugars have increased and they are now within target range, without any low blood sugars in the 50s and 60s anymore.  Upon questioning, she is varying the dose of [place] based on what she has for supper and I advised her not to vary it based on the meals.  I advised her to try to stay with 26 units of Tresiba daily.  She is tolerating Trulicity well and will continue the same dose for now. - I suggested to:  Patient Instructions  Please continue: - Tresiba 24 units daily - Trulicity 1.5 mg weekly  Please call and schedule an eye appt with Dr. Prudencio Burly: [facility]:  Dr. Sherlyn Lick MD   Address: [address], [place], [place] [address]  [phone]  Please return in 4-6 months with your sugar log.   - we checked her HbA1c: 6.6% (higher) - advised to check sugars at different times of the day - 1x a day, rotating check times - advised for yearly eye exams >> she is not UTD.  At last visit, I recommended to establish care with Dr. Lyles-ophthalmology.  However, he lost his income so today I printed his contact information for her again. - return to clinic in 4-6 months  2. HL -Reviewed latest lipid panel from 06/2021: Fractions at goal: Lab Results  Component Value Date    CHOL 149 06/15/2021   HDL 53 06/15/2021   LDLCALC 75 06/15/2021   TRIG 118 06/15/2021   CHOLHDL 2.8 06/15/2021  -Continues on Crestor 20 mg daily without side effects  3.  Obesity class II -She continues on a GLP-1 receptor agonist which should also help with weight loss -She lost 7 pounds before last OV -She gained 6 pounds since last visit  Philemon Kingdom, MD PhD [facility] Endocrinology

## 2021-09-11 NOTE — Patient Instructions (Addendum)
Please change: - Tresiba 26 units daily  Continue: - Trulicity 1.5 mg weekly  Please call and schedule an eye appt with Dr. Prudencio Burly: [facility]:  Dr. Sherlyn Lick MD   Address: [address], [place], [place] [address]  [phone]  Please return in 4 -6 months with your sugar log.

## 2021-09-15 ENCOUNTER — Telehealth (HOSPITAL_COMMUNITY): Payer: Self-pay | Admitting: Emergency Medicine

## 2021-09-15 LAB — METANEPHRINES, URINE, 24 HOUR
Metaneph Total, Ur: 26 ug/L
Metanephrines, 24H Ur: 39 ug/24 hr (ref 36–209)
Normetanephrine, 24H Ur: 213 ug/24 hr (ref 131–612)
Normetanephrine, Ur: 142 ug/L
Total Volume: 1500

## 2021-09-15 LAB — CATECHOLAMINES,UR.,FREE,24 HR
Dopamine, Rand Ur: 104 ug/L
Dopamine, Ur, 24Hr: 156 ug/24 hr (ref 0–510)
Epinephrine, Rand Ur: 1 ug/L
Epinephrine, U, 24Hr: 2 ug/24 hr (ref 0–20)
Norepinephrine, Rand Ur: 19 ug/L
Norepinephrine,U,24H: 29 ug/24 hr (ref 0–135)
Total Volume: 1500

## 2021-09-15 NOTE — Telephone Encounter (Signed)
Reaching out to patient to offer assistance regarding upcoming cardiac imaging study; pt verbalizes understanding of appt date/time, parking situation and where to check in, pre-test NPO status and medications ordered, and verified current allergies; name and call back number provided for further questions should they arise Marchia Bond RN Navigator Cardiac Imaging [facility] [phone] office [phone] cell  R arm only (breast ca on left) Arrival 245p 100mg  metoprolol (patient checked HR and BP while on phone with me. HR 93, BP 127systolic

## 2021-09-16 ENCOUNTER — Other Ambulatory Visit: Payer: Self-pay

## 2021-09-16 ENCOUNTER — Ambulatory Visit (HOSPITAL_COMMUNITY)
Admission: RE | Admit: 2021-09-16 | Discharge: 2021-09-16 | Disposition: A | Discharge status: Home / Self Care | Payer: BC Managed Care – PPO | Source: Ambulatory Visit | Attending: Cardiovascular Disease | Admitting: Cardiovascular Disease

## 2021-09-16 ENCOUNTER — Telehealth: Payer: Self-pay | Admitting: Internal Medicine

## 2021-09-16 ENCOUNTER — Other Ambulatory Visit: Payer: Self-pay | Admitting: Family Medicine

## 2021-09-16 DIAGNOSIS — R072 Precordial pain: Secondary | ICD-10-CM

## 2021-09-16 DIAGNOSIS — E1165 Type 2 diabetes mellitus with hyperglycemia: Secondary | ICD-10-CM

## 2021-09-16 MED ORDER — NITROGLYCERIN 0.4 MG SL SUBL
0.8000 mg | SUBLINGUAL_TABLET | Freq: Once | SUBLINGUAL | Status: AC
  Administered 2021-09-16: 15:00:00 0.8 mg via SUBLINGUAL

## 2021-09-16 MED ORDER — ONETOUCH VERIO VI STRP: ORAL_STRIP | 3 refills | Status: DC

## 2021-09-16 MED ORDER — NITROGLYCERIN 0.4 MG SL SUBL
SUBLINGUAL_TABLET | SUBLINGUAL | Status: AC
  Filled 2021-09-16: qty 2

## 2021-09-16 MED ORDER — IOHEXOL 350 MG/ML SOLN
95.0000 mL | Freq: Once | INTRAVENOUS | Status: AC | PRN
  Administered 2021-09-16: 16:00:00 95 mL via INTRAVENOUS

## 2021-09-16 NOTE — Telephone Encounter (Signed)
MEDICATION: glucose blood (ONETOUCH VERIO) test strip  PHARMACY:   [id number] [address], [place] Phone:  [phone]  Fax:  [phone]      HAS THE PATIENT CONTACTED [facility]?  YES  IS THIS A 90 DAY SUPPLY : YES  IS PATIENT OUT OF MEDICATION: YES  IF NOT; HOW MUCH IS LEFT:   LAST APPOINTMENT DATE: @12 /06/2021  NEXT APPOINTMENT DATE:@5 /05/2022  DO WE HAVE YOUR PERMISSION TO LEAVE A DETAILED MESSAGE?:  OTHER COMMENTS: Pharmacy needs new prescription to fill.   **Let patient know to contact pharmacy at the end of the day to make sure medication is ready. **  ** Please notify patient to allow 48-72 hours to process**  **Encourage patient to contact the pharmacy for refills or they can request refills through [facility]**

## 2021-09-16 NOTE — Telephone Encounter (Signed)
Rx sent 

## 2021-09-17 ENCOUNTER — Other Ambulatory Visit: Payer: Self-pay | Admitting: Family Medicine

## 2021-09-29 ENCOUNTER — Ambulatory Visit (HOSPITAL_COMMUNITY): Payer: BC Managed Care – PPO | Admitting: Hematology

## 2021-10-07 ENCOUNTER — Other Ambulatory Visit (HOSPITAL_COMMUNITY): Payer: Self-pay

## 2021-10-07 DIAGNOSIS — Z122 Encounter for screening for malignant neoplasm of respiratory organs: Secondary | ICD-10-CM

## 2021-10-07 DIAGNOSIS — Z87891 Personal history of nicotine dependence: Secondary | ICD-10-CM

## 2021-10-07 NOTE — Progress Notes (Signed)
LDCT order placed per protocol °

## 2021-10-07 NOTE — Progress Notes (Shared)
Annette [name],  [id number]   CLINIC:  Medical Oncology/Hematology  PCP:  Annette [name], Annette [name] [address], [place] / [place] [address] [phone]   REASON FOR VISIT:  Follow-up for ***  PRIOR THERAPY: ***  NGS Results: ***  CURRENT THERAPY: ***  BRIEF ONCOLOGIC HISTORY:  Oncology History   No history exists.    CANCER STAGING: Cancer Staging  No matching staging information was found for the patient.  INTERVAL HISTORY:  Annette [name], a 61 y.o. female, returns for routine follow-up of her ***. Annette [name] was last seen on {XX/XX/XXXX}.    REVIEW OF SYSTEMS:  Review of Systems - Oncology  PAST MEDICAL/SURGICAL HISTORY:  Past Medical History:  Diagnosis Date   Arthritis    Phreesia 10/14/2020   Blood transfusion without reported diagnosis    Phreesia 10/14/2020   Breast cancer ([facility]) 2007   History of  XRT, onTamoxifen   Bruises easily    Cancer ([facility] [place])    Phreesia 10/14/2020   Depression    Depression    Phreesia 10/14/2020   Diabetes mellitus    prediabetic   Diabetes mellitus without complication ([facility])    Phreesia 10/14/2020   GERD (gastroesophageal reflux disease)    Hypertriglyceridemia    Hypothyroidism    following chemo amnd radiaition for breast cancer, needed replacement short term   Hypothyroidism (acquired)    replaced x 1 year   IBS (irritable bowel syndrome)    Menieres disease    Controlled with triamterene    Migraines    Obesity    Palpitations    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[facility])    Sleep apnea 2010   Problems with CPAP   Past Surgical History:  Procedure Laterality Date   ABDOMINAL EXPOSURE N/A 12/03/2020   Procedure: ABDOMINAL EXPOSURE;  Surgeon: Rosetta Posner, Annette [name];  Location: [facility];  Service: Vascular;  Laterality: N/A;   ABDOMINAL HYSTERECTOMY  1998   Benign, fibroids   ADRENALECTOMY  02/16/2012   [facility], splemic trauma, 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   ANTERIOR LUMBAR FUSION  N/A 12/03/2020   Procedure: Lumbar Four-Five Lumbar Five Sacral One Anterior lumbar interbody fusion;  Surgeon: Ashok Pall, Annette [name];  Location: [facility];  Service: Neurosurgery;  Laterality: N/A;   APPENDECTOMY  06/12/08   BIOPSY  07/02/2020   Procedure: BIOPSY;  Surgeon: Harvel Quale, Annette [name];  [facility];  Service: Gastroenterology;;   BIOPSY  01/22/2021   Procedure: BIOPSY;  Surgeon: Daneil Dolin, Annette [name];  [facility];  Service: Endoscopy;;   BREAST LUMPECTOMY Left 2008   BREAST RECONSTRUCTION     Left reconstructive   BREAST SURGERY  2007   Left lumpectomy   BREAST SURGERY     Mammosite - right side   CHOLECYSTECTOMY  1985   COLONOSCOPY WITH PROPOFOL N/A 07/02/2020   Procedure: COLONOSCOPY WITH PROPOFOL;  Surgeon: Harvel Quale, Annette [name];  [facility];  Service: Gastroenterology;  Laterality: N/A;  1045   COLONOSCOPY WITH PROPOFOL N/A 04/01/2021   Procedure: COLONOSCOPY WITH PROPOFOL;  Surgeon: Harvel Quale, Annette [name];  [facility];  Service: Gastroenterology;  Laterality: N/A;  12:50   ESOPHAGEAL DILATION  12/19/2015   Procedure: ESOPHAGEAL DILATION;  Surgeon: Rogene Houston, Annette [name];  [facility];  Service: Endoscopy;;   ESOPHAGOGASTRODUODENOSCOPY N/A 12/19/2015   Procedure: ESOPHAGOGASTRODUODENOSCOPY (EGD);  Surgeon: Rogene Houston, Annette [name];  [facility];  Service: Endoscopy;  Laterality: N/A;  11:40   FLEXIBLE SIGMOIDOSCOPY N/A 01/22/2021   Procedure: FLEXIBLE SIGMOIDOSCOPY;  Surgeon: Daneil Dolin, Annette [name];  [facility];  Service: Endoscopy;  Laterality: N/A;   HEMOSTASIS CLIP PLACEMENT  04/01/2021   Procedure: HEMOSTASIS CLIP PLACEMENT;  Surgeon: Harvel Quale, Annette [name];  [facility];  Service: Gastroenterology;;  x2   POLYPECTOMY  07/02/2020   Procedure: POLYPECTOMY;  Surgeon: Montez Morita, Quillian Quince, Annette [name];  [facility];  Service: Gastroenterology;;   POLYPECTOMY   04/01/2021   Procedure: POLYPECTOMY INTESTINAL;  Surgeon: Harvel Quale, Annette [name];  [facility];  Service: Gastroenterology;;   SPLENECTOMY, 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0 standard drinks    Comment: Encouraged to quit smoking. She has tried the nicotone gum and patches but did not help   Drug use: No   Sexual activity: Yes    Birth control/protection: Surgical  Other Topics Concern   Not on file  Social History Narrative   Not on file   Social Determinants of Health   Financial Resource Strain: Medium Risk   Difficulty of Paying Living Expenses: Somewhat hard  Food Insecurity: No Food Insecurity   Worried About Charity fundraiser in the Last Year: Never true   Ran Out of Food in the Last Year: Never true  Transportation Needs: No Transportation Needs   Lack of Transportation (Medical): No   Lack of Transportation (Non-Medical): No  Physical Activity: Inactive   Days of Exercise per Week: 0 days   Minutes of Exercise per Session: 0 min  Stress: Stress Concern Present   Feeling of Stress : To some extent  Social Connections: Moderately Integrated   Frequency of Communication with Friends and Family: Three times a week   Frequency of Social Gatherings with Friends and Family: Three times a week   Attends Religious Services:  More than 4 times per year   Active Member of Clubs or Organizations: No   Attends Archivist Meetings: Never   Marital Status: Married  Human resources officer Violence: Not At Risk   Fear of Current or Ex-Partner: No   Emotionally Abused: No   Physically Abused: No   Sexually Abused: No    FAMILY HISTORY:  Family History  Problem Relation Age of Onset   Diabetes Mother    Arrhythmia Mother    Heart disease Father    Hyperlipidemia Father    Arrhythmia Father    Cancer Paternal Grandmother        Breast   Diabetes Sister    Diabetes Brother    Heart disease Brother     CURRENT MEDICATIONS:  Current Outpatient Medications  Medication Sig  Dispense Refill   aspirin 81 MG EC tablet Take 81 mg by mouth in the morning.     CLOTRIMAZOLE EX Apply 1 application topically daily as needed (itching an breast).     cyclobenzaprine (FLEXERIL) 10 MG tablet TAKE 1 TABLET BY MOUTH EVERY DAY AT BEDTIME AS NEEDED FOR MUSCLE SPASM 30 tablet 4   Dulaglutide (TRULICITY) 1.5 JQ/3.0SP SOPN INJECT 1.5MG  SUBCUTANEOUSLY ONCE A WEEK 6 mL 3   glipiZIDE (GLUCOTROL XL) 5 MG 24 hr tablet Take 5 mg by mouth daily.     glucose blood (ONETOUCH VERIO) test strip USE AS DIRECTED 2 TIMES DAILY 200 strip 3   glucose blood test strip See admin instructions.     insulin degludec (TRESIBA FLEXTOUCH) 200 UNIT/ML FlexTouch Pen Inject 26 Units into the skin at bedtime. 18 mL 3   Insulin Pen Needle (PEN NEEDLES) 31G X 6 MM MISC See admin instructions.     Lancets (ONETOUCH DELICA PLUS [id number]) MISC Use 4 lancets per day to check blood sugar 200 each 3   LORazepam (ATIVAN) 1 MG tablet Take 1 mg by mouth 2 (two) times daily as needed (Nausea).     naproxen (NAPROSYN) 500 MG tablet TAKE 1 TABLET(500 MG) BY MOUTH TWICE DAILY WITH A MEAL 40 tablet 0   omeprazole (PRILOSEC) 40 MG capsule TAKE 1 CAPSULE DAILY 90 capsule 1   rosuvastatin (CRESTOR) 20 MG tablet Take 1 tablet (20 mg total) by mouth daily. 90 tablet 1    temazepam (RESTORIL) 15 MG capsule Take 1 capsule (15 mg total) by mouth at bedtime as needed for sleep. 90 capsule 1   traZODone (DESYREL) 100 MG tablet TAKE 1 TABLET(100 MG) BY MOUTH AT BEDTIME 90 tablet 1   triamterene-hydrochlorothiazide (DYAZIDE) 37.5-25 MG capsule Take 1 capsule by mouth daily.     UNABLE TO FIND CPAP tubing and supplies, face mask Dx: G47.30 1 each 0   venlafaxine XR (EFFEXOR-XR) 75 MG 24 hr capsule Take 1 capsule (75 mg total) by mouth daily with breakfast. TAKE 1 CAPSULE(75 MG) BY MOUTH DAILY 90 capsule 0   Vitamin D, Ergocalciferol, (DRISDOL) 1.25 MG (50000 UNIT) CAPS capsule Take 1 capsule (50,000 Units total) by mouth once a week. 12 capsule 0   No current facility-administered medications for this visit.    ALLERGIES:  Allergies  Allergen Reactions   Penicillins Swelling    Has patient had a PCN reaction causing immediate rash, facial/tongue/throat swelling, SOB or lightheadedness with hypotension: Yes Has patient had a PCN reaction causing severe rash involving mucus membranes or skin necrosis: No Has patient had a PCN reaction that required hospitalization: No  Has patient had a PCN reaction occurring within the last 10 years: No If all of the above answers are "NO", then may proceed with Cephalosporin use.   Of face and tongue.   Metformin And Related Diarrhea and Nausea Only    Pt to discontinue med due to intolerance, cramps and diarrhea   Wound Dressing Adhesive Itching and Rash    Dermabond and other surgical glues for incisions   Empagliflozin Other (See Comments)    Yeast infection  Jardiance   Statins Other (See Comments)    Fell bad but on Crestor   Zetia [Ezetimibe] Other (See Comments)    Feels funny    PHYSICAL EXAM:  Performance status (ECOG): {CHL ONC QJ:[id number]}  There were no vitals filed for this visit. Wt Readings from Last 3 Encounters:  09/11/21 200 lb (90.7 kg)  09/07/21 199 lb 6.4 oz (90.4 kg)  09/02/21 197 lb (89.4  kg)   Physical Exam   LABORATORY DATA:  I have reviewed the labs as listed.  CBC Latest Ref Rng & Units 01/23/2021 01/20/2021 01/17/2021  WBC 4.0 - 10.5 K/uL 10.1 12.0(H) 12.7(H)  Hemoglobin 12.0 - 15.0 g/dL 12.2 12.8 14.9  Hematocrit 36.0 - 46.0 % 37.4 39.4 43.8  Platelets 150 - 400 K/uL 303 314 350   CMP Latest Ref Rng & Units 09/02/2021 06/15/2021 03/23/2021  Glucose 70 - 99 mg/dL 77 90 84  BUN 8 - 27 mg/dL 15 11 12   Creatinine 0.57 - 1.00 mg/dL 0.96 1.03(H) 1.01(H)  Sodium 134 - 144 mmol/L 145(H) 142 136  Potassium 3.5 - 5.2 mmol/L 4.5 4.7 4.2  Chloride 96 - 106 mmol/L 104 100 100  CO2 20 - 29 mmol/L 22 27 27   Calcium 8.7 - 10.3 mg/dL 10.0 9.7 9.5  Total Protein 6.0 - 8.5 g/dL - 7.3 -  Total Bilirubin 0.0 - 1.2 mg/dL - 0.5 -  Alkaline Phos 44 - 121 IU/L - 85 -  AST 0 - 40 IU/L - 20 -  ALT 0 - 32 IU/L - 15 -    DIAGNOSTIC IMAGING:  I have independently reviewed the scans and discussed with the patient. CT CORONARY MORPH W/CTA COR W/SCORE W/CA W/CM &/OR WO/CM  Addendum Date: 09/16/2021   ADDENDUM REPORT: 09/16/2021 16:27 HISTORY: 61 yo female with chest pain/anginal equiv, intermediate CAD risk, not treadmill candidate EXAM: Cardiac/Coronary CTA TECHNIQUE: The patient was scanned on a [employer]. PROTOCOL: A 100 kV prospective scan was triggered in the descending thoracic aorta at 111 HU's. Axial non-contrast 3 mm slices were carried out through the heart. The data set was analyzed on a dedicated work station and scored using the [employer]. Gantry rotation speed was 250 msecs and collimation was .6 mm. Beta blockade and 0.8 mg of sl NTG was given. The 3D data set was reconstructed in 5% intervals of the 35-75 % of the R-R cycle. Diastolic phases were analyzed on a dedicated work station using MPR, MIP and VRT modes. The patient received 22mL OMNIPAQUE IOHEXOL 350 MG/ML SOLN of contrast. FINDINGS: Quality: Excellent, HR 71 Coronary calcium score: The patient's coronary  artery calcium score is 0, which places the patient in the 0 percentile. Coronary arteries: Normal coronary origins.  Right dominance. Right Coronary Artery: Dominant.  Normal artery. Left Main Coronary Artery: Normal. Bifurcates into the LAD and LCx arteries. Left Anterior Descending Coronary Artery: Large anterior vessel that wraps around the apex. No disease. 3 small diagonals without disease. Left Circumflex Artery: AV groove LCx artery without disease. Large high OM1 branch without disease. Aorta: Normal size, 23 mm at the mid ascending aorta (level of the PA bifurcation) measured double oblique. No calcifications. No dissection. Aortic Valve: Trileaflet. No calcifications. Other findings: Normal pulmonary vein drainage into the left atrium. Normal left atrial appendage without a thrombus. Normal size of the pulmonary artery. IMPRESSION: 1. No evidence of CAD, CADRADS = 0. 2. Coronary calcium score of 0. This was 0 percentile for age and sex matched control. 3. Normal coronary origin with right dominance. 4. Consider non-coronary causes of chest pain. Electronically Signed   By: Pixie Casino M.D.   On: 09/16/2021 16:27   Result Date: 09/16/2021 EXAM: OVER-READ INTERPRETATION  CT CHEST The following report is an over-read performed by radiologist Dr. Rolm Baptise of [facility], [place] on 09/16/2021. This over-read does  not include interpretation of cardiac or coronary anatomy or pathology. The coronary CTA interpretation by the cardiologist is attached. COMPARISON:  04/17/2021 FINDINGS: Vascular: Heart is normal size.  Aorta normal caliber. Mediastinum/Nodes: No adenopathy Lungs/Pleura: No confluent opacity or effusion. Upper Abdomen: Imaging into the upper abdomen demonstrates no acute findings. Musculoskeletal: Surgical clips in the left breast. Chest wall soft tissues otherwise unremarkable. No acute bony abnormality. IMPRESSION: No acute extra cardiac abnormality. Electronically Signed: By: Rolm Baptise M.D. On: 09/16/2021 16:07     ASSESSMENT:  ***   PLAN:  ***   Orders placed this encounter:  No orders of the defined types were placed in this encounter.    Derek Jack, Annette [name] [facility] [phone]   I, ***, am acting as a scribe for Dr. Derek Jack.  {Add Barista Statement}

## 2021-10-08 ENCOUNTER — Encounter (HOSPITAL_COMMUNITY): Payer: Self-pay

## 2021-10-08 ENCOUNTER — Inpatient Hospital Stay (HOSPITAL_COMMUNITY): Payer: BC Managed Care – PPO | Attending: Hematology | Admitting: Hematology

## 2021-10-13 ENCOUNTER — Other Ambulatory Visit: Payer: Self-pay | Admitting: Family Medicine

## 2021-10-13 DIAGNOSIS — E7800 Pure hypercholesterolemia, unspecified: Secondary | ICD-10-CM

## 2021-10-15 ENCOUNTER — Encounter: Payer: Self-pay | Admitting: Internal Medicine

## 2021-10-15 ENCOUNTER — Other Ambulatory Visit: Payer: Self-pay

## 2021-10-15 ENCOUNTER — Ambulatory Visit: Payer: BC Managed Care – PPO | Admitting: Internal Medicine

## 2021-10-15 VITALS — BP 122/84 | HR 96 | Temp 98.4°F | Ht 65.5 in | Wt 198.0 lb

## 2021-10-15 DIAGNOSIS — R0609 Other forms of dyspnea: Secondary | ICD-10-CM

## 2021-10-15 DIAGNOSIS — F17210 Nicotine dependence, cigarettes, uncomplicated: Secondary | ICD-10-CM | POA: Diagnosis not present

## 2021-10-15 NOTE — Assessment & Plan Note (Signed)
Counseled re importance of smoking cessation but did not meet time criteria for separate billing           Each maintenance medication was reviewed in detail including emphasizing most importantly the difference between maintenance and prns and under what circumstances the prns are to be triggered using an action plan format where appropriate.  Total time for H and P, chart review, counseling,  directly observing portions of ambulatory 02 saturation study/ and generating customized AVS unique to this office visit / same day charting  > 45 min

## 2021-10-15 NOTE — Patient Instructions (Signed)
Schedule pfts   I have contact your endocrinologist regarding your low cortisol levels and someone should be in contact with you soon   Once the lightheadedness is addressed: To get the most out of exercise, you need to be continuously aware that you are short of breath, but never out of breath, for at least 30 minutes daily. As you improve, it will actually be easier for you to do the same amount of exercise  in  30 minutes so always push to the level where you are short of breath.    If not improved after a few weeks of the above exercise then we can consider fancier study called a CPST in [place]

## 2021-10-15 NOTE — Assessment & Plan Note (Addendum)
Onset 3903 p "complications of adrenal surgery" at [facility] > multiple days in ICU - PFTs 07/07/17  No airflow obst/ ERV 13% @ wt 218  - 10/15/2021   Walked on RA  x  3  lap(s) =  approx 450  ft  @ moderate pace, stopped due to sob on 2nd lap with lowest 02 sats 97% s presyncope   Symptoms are markedly disproportionate to objective findings and not clear to what extent this is actually a pulmonary  problem but pt does appear to have difficult to sort out respiratory symptoms of unknown origin for which  DDX  = almost all start with A and  include Adherence, Ace Inhibitors, Acid Reflux, Active Sinus Disease, Alpha 1 Antitripsin deficiency, Anxiety masquerading as Airways dz,  ABPA,  Allergy(esp in young), Aspiration (esp in elderly), Adverse effects of meds,  Active smoking or Vaping, A bunch of PE's/clot burden (a few small clots can't cause this syndrome unless there is already severe underlying pulm or vascular dz with poor reserve),  Anemia or thyroid disorder, plus two Bs  = Bronchiectasis and Beta blocker use..and one C= CHF     Adherence is always the initial "prime suspect" and is a multilayered concern that requires a "trust but verify" approach in every patient - starting with knowing how to use medications, especially inhalers, correctly, keeping up with refills and understanding the fundamental difference between maintenance and prns vs those medications only taken for a very short course and then stopped and not refilled.   Active smoking also near top of the list > needs to stop and return for repeat pfts with DLCO as has emphysema on LDSCT  04/17/21 but hold off on empirical bronchodilators based on prior study w/o obst.  ? Anxiety/depressin/ deconditioning appear to be significant  > usually at the bottom of this list of usual suspects but  may interfere with adherence and also interpretation of response or lack thereof to symptom management which can be quite subjective.  >> rec regulr submax ex  p we sort out her presyncope and then CPST next step  ? Adverse drug effects > none of the usual suspects listed  ? Anemia/ adrenal/thryoid dz   Lab Results  Component Value Date   HGB 12.2 01/23/2021   HGB 12.8 01/20/2021   HGB 14.9 01/17/2021   HGB 14.2 12/11/2020   HGB 17.6 (H) 02/20/2020   HGB 15.9 08/08/2018    Lab Results  Component Value Date   TSH 1.835 01/16/2021    Cortisol am level 09/07/21   =  6.6 > will message endocrinology for input    ? chf > cards w/u unrevealing

## 2021-10-15 NOTE — Progress Notes (Signed)
Annette [name], female    DOB: [birth year],    MRN: [id number]   Brief patient profile:  67 yowf   active smoker  referred to pulmonary clinic in [place]  10/15/2021 by Annette [name] for doe 7425 p complications for "adrenal bx" then further downhill p back surgery March 2022    History of Present Illness  10/15/2021  Pulmonary/ 1st office eval/ Annette [name] / [name]  Chief Complaint  Patient presents with   Consult    Ref. By Annette [name] for abnormal PFT. (?) SOB  for a few yearsv  Dyspnea:  room to room at home (not reproduced at ov) Cough: not a problem  Sleep: cpap x years on side with one pillow / Annette [name] SABA use: not lately  Has taken prednisone makes her gain wt refuses to start it back but hasn't taken in years   No obvious day to day or daytime variability or assoc excess/ purulent sputum or mucus plugs or hemoptysis or cp or chest tightness, subjective wheeze or overt sinus or hb symptoms.   Sleeping as above without nocturnal  or early am exacerbation  of respiratory  c/o's or need for noct saba. Also denies any obvious fluctuation of symptoms with weather or environmental changes or other aggravating or alleviating factors except as outlined above   No unusual exposure hx or h/o childhood pna/ asthma or knowledge of premature birth.  Current Allergies, Complete Past Medical History, Past Surgical History, Family History, and Social History were reviewed in [employer] record.  ROS  The following are not active complaints unless bolded Hoarseness, sore throat, dysphagia, dental problems, itching, sneezing,  nasal congestion or discharge of excess mucus or purulent secretions, ear ache,   fever, chills, sweats, unintended wt loss or wt gain, classically pleuritic or exertional cp,  orthopnea pnd or arm/hand swelling  or leg swelling, presyncope, palpitations, abdominal pain, anorexia, nausea, vomiting, diarrhea  or change in bowel habits or change in  bladder habits, change in stools or change in urine, dysuria, hematuria,  rash, arthralgias, visual complaints, headache, numbness, weakness or ataxia or problems with walking or coordination,  change in mood or  memory.                  Past Medical History:  Diagnosis Date   Arthritis    [employer]ia 10/14/2020   Blood transfusion without reported diagnosis    [employer] 10/14/2020   Breast cancer ([place]) 2007   History of  XRT, onTamoxifen   Bruises easily    Cancer ([place])    [employer]reesia 10/14/2020   Depression    Depression    [employer]reesia 10/14/2020   Diabetes mellitus    prediabetic   Diabetes mellitus without complication ([place])    Phreesia 10/14/2020   GERD (gastroesophageal reflux disease)    Hypertriglyceridemia    Hypothyroidism    following chemo amnd radiaition for breast cancer, needed replacement short term   Hypothyroidism (acquired)    replaced x 1 year   IBS (irritable bowel syndrome)    Menieres disease    Controlled with triamterene    Migraines    Obesity    Palpitations    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     CLOTRIMAZOLE EX Apply 1 application  topically daily as needed (itching an breast).     cyclobenzaprine (FLEXERIL) 10 MG tablet TAKE 1 TABLET BY MOUTH EVERY DAY AT BEDTIME AS NEEDED FOR MUSCLE SPASM 30 tablet 4   Dulaglutide (TRULICITY) 1.5 GD/9.2EQ SOPN INJECT 1.5MG  SUBCUTANEOUSLY ONCE A WEEK 6 mL 3   glucose blood (ONETOUCH VERIO) test strip USE AS DIRECTED 2 TIMES DAILY 200 strip 3   glucose blood test strip See admin instructions.     insulin degludec (TRESIBA FLEXTOUCH) 200 UNIT/ML FlexTouch Pen Inject 26 Units into the skin at bedtime. 18 mL 3   Insulin Pen Needle (PEN NEEDLES) 31G X 6 MM MISC See admin instructions.     Lancets (ONETOUCH DELICA PLUS ASTMHD62I) MISC Use 4  lancets per day to check blood sugar 200 each 3   LORazepam (ATIVAN) 1 MG tablet Take 1 mg by mouth 2 (two) times daily as needed (Nausea).     naproxen (NAPROSYN) 500 MG tablet TAKE 1 TABLET(500 MG) BY MOUTH TWICE DAILY WITH A MEAL 40 tablet 0   omeprazole (PRILOSEC) 40 MG capsule TAKE 1 CAPSULE DAILY 90 capsule 1   rosuvastatin (CRESTOR) 20 MG tablet TAKE 1 TABLET DAILY 90 tablet 1   temazepam (RESTORIL) 15 MG capsule Take 1 capsule (15 mg total) by mouth at bedtime as needed for sleep. 90 capsule 1   traZODone (DESYREL) 100 MG tablet TAKE 1 TABLET(100 MG) BY MOUTH AT BEDTIME 90 tablet 1   triamterene-hydrochlorothiazide (DYAZIDE) 37.5-25 MG capsule TAKE 1 CAPSULE EVERY       MORNING 90 capsule 1   UNABLE TO FIND CPAP tubing and supplies, face mask Dx: G47.30 1 each 0   venlafaxine XR (EFFEXOR-XR) 75 MG 24 hr capsule Take 1 capsule (75 mg total) by mouth daily with breakfast. TAKE 1 CAPSULE(75 MG) BY MOUTH DAILY 90 capsule 0   Vitamin D, Ergocalciferol, (DRISDOL) 1.25 MG (50000 UNIT) CAPS capsule Take 1 capsule (50,000 Units total) by mouth once a week. 12 capsule 0   glipiZIDE (GLUCOTROL XL) 5 MG 24 hr tablet Take 5 mg by mouth daily.     No facility-administered medications prior to visit.     Objective:     BP 122/84 (BP Location: Left Arm, Patient Position: Sitting)    Pulse 96    Temp 98.4 F (36.9 C) (Temporal)    Ht 5' 5.5" (1.664 m)    Wt 198 lb 0.6 oz (89.8 kg)    SpO2 99% Comment: ra   BMI 32.45 kg/m   SpO2: 99 % (ra)  Amb wm nad   HEENT : pt wearing mask not removed for exam due to covid -19 concerns.    NECK :  without JVD/Nodes/TM/ nl carotid upstrokes bilaterally   LUNGS: no acc muscle use,  Nl contour chest which is clear to A and P bilaterally without cough on insp or exp maneuvers   CV:  RRR  no s3 or murmur or increase in P2, and no edema   ABD:  soft and nontender with nl inspiratory excursion in the supine position. No bruits or organomegaly appreciated,  bowel sounds nl  MS:  Nl gait/ ext warm without deformities, calf tenderness, cyanosis or clubbing No obvious joint restrictions   SKIN: warm and dry without lesions    NEURO:  alert, approp, nl sensorium with  no motor or cerebellar deficits apparent.     I personally reviewed images and agree with radiology impression as follows:   Chest CT cuts on CT  coronary 09/16/21 Nl   LDSCT  04/17/21  Mild diffuse bronchial wall thickening with mild centrilobular and paraseptal emphysema; imaging findings suggestive of underlying COPD.     Assessment   DOE (dyspnea on exertion) Onset 5329 p "complications of adrenal surgery" at [facility] > multiple days in ICU - PFTs 07/07/17  No airflow obst/ ERV 13% @ wt 218  - 10/15/2021   Walked on RA  x  3  lap(s) =  approx 450  ft  @ moderate pace, stopped due to sob on 2nd lap with lowest 02 sats 97% s presyncope   Symptoms are markedly disproportionate to objective findings and not clear to what extent this is actually a pulmonary  problem but pt does appear to have difficult to sort out respiratory symptoms of unknown origin for which  DDX  = almost all start with A and  include Adherence, Ace Inhibitors, Acid Reflux, Active Sinus Disease, Alpha 1 Antitripsin deficiency, Anxiety masquerading as Airways dz,  ABPA,  Allergy(esp in young), Aspiration (esp in elderly), Adverse effects of meds,  Active smoking or Vaping, A bunch of PE's/clot burden (a few small clots can't cause this syndrome unless there is already severe underlying pulm or vascular dz with poor reserve),  Anemia or thyroid disorder, plus two Bs  = Bronchiectasis and Beta blocker use..and one C= CHF     Adherence is always the initial "prime suspect" and is a multilayered concern that requires a "trust but verify" approach in every patient - starting with knowing how to use medications, especially inhalers, correctly, keeping up with refills and understanding the fundamental difference between  maintenance and prns vs those medications only taken for a very short course and then stopped and not refilled.   Active smoking also near top of the list > needs to stop and return for repeat pfts with DLCO as has emphysema on LDSCT  04/17/21 but hold off on empirical bronchodilators based on prior study w/o obst.  ? Anxiety/depressin/ deconditioning appear to be significant  > usually at the bottom of this list of usual suspects but  may interfere with adherence and also interpretation of response or lack thereof to symptom management which can be quite subjective.  >> rec regulr submax ex p we sort out her presyncope and then CPST next step  ? Adverse drug effects > none of the usual suspects listed  ? Anemia/ adrenal/thryoid dz   Lab Results  Component Value Date   HGB 12.2 01/23/2021   HGB 12.8 01/20/2021   HGB 14.9 01/17/2021   HGB 14.2 12/11/2020   HGB 17.6 (H) 02/20/2020   HGB 15.9 08/08/2018    Lab Results  Component Value Date   TSH 1.835 01/16/2021    Cortisol am level 09/07/21   =  6.6 > will message endocrinology for input    ? chf > cards w/u unrevealing    Cigarette smoker Counseled re importance of smoking cessation but did not meet time criteria for separate billing           Each maintenance medication was reviewed in detail including emphasizing most importantly the difference between maintenance and prns and under what circumstances the prns are to be triggered using an action plan format where appropriate.  Total time for H and P, chart review, counseling,  directly observing portions of ambulatory 02 saturation study/ and generating customized AVS unique to this office visit / same day charting  > 45 min  Christinia Gully, MD 10/15/2021

## 2021-10-16 ENCOUNTER — Encounter: Payer: Self-pay | Admitting: Internal Medicine

## 2021-10-16 ENCOUNTER — Other Ambulatory Visit: Payer: Self-pay | Admitting: Internal Medicine

## 2021-10-16 DIAGNOSIS — E278 Other specified disorders of adrenal gland: Secondary | ICD-10-CM

## 2021-10-24 ENCOUNTER — Other Ambulatory Visit: Payer: Self-pay | Admitting: Family Medicine

## 2021-11-02 ENCOUNTER — Other Ambulatory Visit: Payer: Self-pay

## 2021-11-02 ENCOUNTER — Other Ambulatory Visit (INDEPENDENT_AMBULATORY_CARE_PROVIDER_SITE_OTHER): Payer: BC Managed Care – PPO

## 2021-11-02 ENCOUNTER — Ambulatory Visit (INDEPENDENT_AMBULATORY_CARE_PROVIDER_SITE_OTHER): Payer: BC Managed Care – PPO

## 2021-11-02 DIAGNOSIS — E278 Other specified disorders of adrenal gland: Secondary | ICD-10-CM | POA: Diagnosis not present

## 2021-11-02 LAB — POTASSIUM: Potassium: 3.8 mEq/L (ref 3.5–5.1)

## 2021-11-02 LAB — CORTISOL
Cortisol, Plasma: 24.4 ug/dL
Cortisol, Plasma: 37.2 ug/dL
Cortisol, Plasma: 44.3 ug/dL

## 2021-11-02 MED ORDER — COSYNTROPIN 0.25 MG IJ SOLR
0.2500 mg | Freq: Once | INTRAMUSCULAR | Status: AC
  Administered 2021-11-02: 0.25 mg via INTRAVENOUS

## 2021-11-02 NOTE — Progress Notes (Signed)
Cosyntropin injection administered to pt's right arm. Pt tolerated well. Phlebotomist advised of time.

## 2021-11-03 ENCOUNTER — Ambulatory Visit: Payer: BC Managed Care – PPO | Admitting: Cardiovascular Disease

## 2021-11-04 ENCOUNTER — Inpatient Hospital Stay (HOSPITAL_COMMUNITY): Payer: BC Managed Care – PPO | Attending: Hematology

## 2021-11-04 ENCOUNTER — Other Ambulatory Visit: Payer: Self-pay

## 2021-11-04 DIAGNOSIS — Z9081 Acquired absence of spleen: Secondary | ICD-10-CM | POA: Diagnosis not present

## 2021-11-04 DIAGNOSIS — F17210 Nicotine dependence, cigarettes, uncomplicated: Secondary | ICD-10-CM | POA: Insufficient documentation

## 2021-11-04 DIAGNOSIS — Z803 Family history of malignant neoplasm of breast: Secondary | ICD-10-CM | POA: Diagnosis not present

## 2021-11-04 DIAGNOSIS — E279 Disorder of adrenal gland, unspecified: Secondary | ICD-10-CM | POA: Insufficient documentation

## 2021-11-04 DIAGNOSIS — Z7985 Long-term (current) use of injectable non-insulin antidiabetic drugs: Secondary | ICD-10-CM | POA: Diagnosis not present

## 2021-11-04 DIAGNOSIS — Z79899 Other long term (current) drug therapy: Secondary | ICD-10-CM | POA: Insufficient documentation

## 2021-11-04 DIAGNOSIS — D72820 Lymphocytosis (symptomatic): Secondary | ICD-10-CM

## 2021-11-04 DIAGNOSIS — Z853 Personal history of malignant neoplasm of breast: Secondary | ICD-10-CM | POA: Insufficient documentation

## 2021-11-04 LAB — CBC WITH DIFFERENTIAL/PLATELET
Abs Immature Granulocytes: 0.05 10*3/uL (ref 0.00–0.07)
Basophils Absolute: 0.1 10*3/uL (ref 0.0–0.1)
Basophils Relative: 1 %
Eosinophils Absolute: 0.2 10*3/uL (ref 0.0–0.5)
Eosinophils Relative: 1 %
HCT: 46.3 % — ABNORMAL HIGH (ref 36.0–46.0)
Hemoglobin: 15.9 g/dL — ABNORMAL HIGH (ref 12.0–15.0)
Immature Granulocytes: 0 %
Lymphocytes Relative: 29 %
Lymphs Abs: 4.9 10*3/uL — ABNORMAL HIGH (ref 0.7–4.0)
MCH: 33.3 pg (ref 26.0–34.0)
MCHC: 34.3 g/dL (ref 30.0–36.0)
MCV: 97.1 fL (ref 80.0–100.0)
Monocytes Absolute: 1.2 10*3/uL — ABNORMAL HIGH (ref 0.1–1.0)
Monocytes Relative: 7 %
Neutro Abs: 10.5 10*3/uL — ABNORMAL HIGH (ref 1.7–7.7)
Neutrophils Relative %: 62 %
Platelets: 396 10*3/uL (ref 150–400)
RBC: 4.77 MIL/uL (ref 3.87–5.11)
RDW: 12.9 % (ref 11.5–15.5)
WBC: 17 10*3/uL — ABNORMAL HIGH (ref 4.0–10.5)
nRBC: 0 % (ref 0.0–0.2)

## 2021-11-04 LAB — LACTATE DEHYDROGENASE: LDH: 142 U/L (ref 98–192)

## 2021-11-07 LAB — EXTRA SPECIMEN

## 2021-11-07 LAB — ALDOSTERONE + RENIN ACTIVITY W/ RATIO
ALDO / PRA Ratio: 2 Ratio (ref 0.9–28.9)
Aldosterone: 27 ng/dL
Renin Activity: 13.41 ng/mL/h — ABNORMAL HIGH (ref 0.25–5.82)

## 2021-11-07 LAB — ACTH: C206 ACTH: 23 pg/mL (ref 6–50)

## 2021-11-09 ENCOUNTER — Ambulatory Visit (HOSPITAL_COMMUNITY)
Admission: RE | Admit: 2021-11-09 | Discharge: 2021-11-09 | Disposition: A | Discharge status: Home / Self Care | Payer: BC Managed Care – PPO | Source: Ambulatory Visit | Attending: Hematology | Admitting: Hematology

## 2021-11-09 ENCOUNTER — Other Ambulatory Visit: Payer: Self-pay

## 2021-11-09 DIAGNOSIS — Z1231 Encounter for screening mammogram for malignant neoplasm of breast: Secondary | ICD-10-CM | POA: Insufficient documentation

## 2021-11-10 MED ORDER — DEXAMETHASONE 1 MG PO TABS: ORAL_TABLET | ORAL | 0 refills | Status: DC

## 2021-11-11 ENCOUNTER — Other Ambulatory Visit (HOSPITAL_COMMUNITY): Payer: Self-pay | Admitting: *Deleted

## 2021-11-11 ENCOUNTER — Encounter: Payer: Self-pay | Admitting: Family Medicine

## 2021-11-11 ENCOUNTER — Other Ambulatory Visit: Payer: Self-pay

## 2021-11-11 ENCOUNTER — Inpatient Hospital Stay (HOSPITAL_COMMUNITY): Payer: BC Managed Care – PPO | Admitting: Hematology

## 2021-11-11 VITALS — BP 104/68 | HR 98 | Temp 98.3°F | Resp 18 | Ht 65.0 in | Wt 198.4 lb

## 2021-11-11 DIAGNOSIS — E278 Other specified disorders of adrenal gland: Secondary | ICD-10-CM

## 2021-11-11 DIAGNOSIS — D72829 Elevated white blood cell count, unspecified: Secondary | ICD-10-CM

## 2021-11-11 DIAGNOSIS — E279 Disorder of adrenal gland, unspecified: Secondary | ICD-10-CM | POA: Diagnosis not present

## 2021-11-11 NOTE — Progress Notes (Signed)
Annette [name], [place] [address]   CLINIC:  Medical Oncology/Hematology  PCP:  Fayrene Helper, MD [address] [place] / [place] [address] [phone]   REASON FOR VISIT:  Follow-up for right adrenal nodules  PRIOR THERAPY: left adrenalectomy and splenectomy on 02/16/2012  NGS Results: not done  CURRENT THERAPY: surveillance  BRIEF ONCOLOGIC HISTORY:  Oncology History   No history exists.    CANCER STAGING:  Cancer Staging  No matching staging information was found for the patient.  INTERVAL HISTORY:  [name], a 61 y.o. female, returns for routine follow-up of her right adrenal nodules. Annette [name] was last seen on 09/07/2021.   Today she reports feeling good. She currently smokes 1/2 ppd. She reports continued dizziness and headaches.   REVIEW OF SYSTEMS:  Review of Systems  Constitutional:  Positive for fatigue. Negative for appetite change.  Respiratory:  Positive for shortness of breath.   Cardiovascular:  Positive for palpitations.  Gastrointestinal:  Positive for constipation, diarrhea and nausea.  Musculoskeletal:  Positive for arthralgias (4/10 legs and hips).  Neurological:  Positive for dizziness and headaches.  All other systems reviewed and are negative.  PAST MEDICAL/SURGICAL HISTORY:  Past Medical History:  Diagnosis Date   Arthritis    Phreesia 10/14/2020   Blood transfusion without reported diagnosis    Phreesia 10/14/2020   Breast cancer ([place]) 2007   History of  XRT, onTamoxifen   Bruises easily    Cancer ([place])    Phreesia 10/14/2020   Depression    Depression    Phreesia 10/14/2020   Diabetes mellitus    prediabetic   Diabetes mellitus without complication ([place])    Phreesia 10/14/2020   GERD (gastroesophageal reflux disease)    Hypertriglyceridemia    Hypothyroidism    following chemo amnd radiaition for breast cancer, needed replacement short term   Hypothyroidism  (acquired)    replaced x 1 year   IBS (irritable bowel syndrome)    Menieres disease    Controlled with triamterene    Migraines    Obesity    Palpitations    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[place])    Sleep apnea 2010   Problems with CPAP   Past Surgical History:  Procedure Laterality Date   ABDOMINAL EXPOSURE N/A 12/03/2020   Procedure: ABDOMINAL EXPOSURE;  Surgeon: Rosetta Posner, MD;  Location: [facility];  Service: Vascular;  Laterality: N/A;   ABDOMINAL HYSTERECTOMY  1998   Benign, fibroids   ADRENALECTOMY  02/16/2012   [facility], splemic trauma, 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   ANTERIOR LUMBAR FUSION N/A 12/03/2020   Procedure: Lumbar Four-Five Lumbar Five Sacral One Anterior lumbar interbody fusion;  Surgeon: Ashok Pall, MD;  Location: [facility];  Service: Neurosurgery;  Laterality: N/A;   APPENDECTOMY  06/12/08   BIOPSY  07/02/2020   Procedure: BIOPSY;  Surgeon: Harvel Quale, MD;  [facility];  Service: Gastroenterology;;   BIOPSY  01/22/2021   Procedure: BIOPSY;  Surgeon: Daneil Dolin, MD;  [facility];  Service: Endoscopy;;   BREAST LUMPECTOMY Left 2008   BREAST RECONSTRUCTION     Left reconstructive   BREAST SURGERY  2007   Left lumpectomy   BREAST SURGERY     Mammosite - right side   CHOLECYSTECTOMY  1985   COLONOSCOPY WITH PROPOFOL N/A 07/02/2020   Procedure: COLONOSCOPY WITH PROPOFOL;  Surgeon: Harvel Quale, MD;  [facility];  Service: Gastroenterology;  Laterality: N/A;  1045   COLONOSCOPY WITH PROPOFOL N/A 04/01/2021   Procedure: COLONOSCOPY WITH PROPOFOL;  Surgeon: Harvel Quale, MD;  [facility];  Service: Gastroenterology;  Laterality: N/A;  12:50   ESOPHAGEAL DILATION  12/19/2015   Procedure: ESOPHAGEAL DILATION;  Surgeon: Rogene Houston, MD;  [facility];  Service: Endoscopy;;   ESOPHAGOGASTRODUODENOSCOPY N/A 12/19/2015   Procedure: ESOPHAGOGASTRODUODENOSCOPY (EGD);  Surgeon: Rogene Houston, MD;  [facility];  Service: Endoscopy;  Laterality: N/A;  11:40   FLEXIBLE SIGMOIDOSCOPY N/A 01/22/2021   Procedure: FLEXIBLE SIGMOIDOSCOPY;  Surgeon: Daneil Dolin, MD;  [facility];  Service: Endoscopy;  Laterality: N/A;   HEMOSTASIS CLIP PLACEMENT  04/01/2021   Procedure: HEMOSTASIS CLIP PLACEMENT;  Surgeon: Harvel Quale, MD;  [facility];  Service: Gastroenterology;;  x2   POLYPECTOMY  07/02/2020   Procedure: POLYPECTOMY;  Surgeon: Montez Morita, Quillian Quince, MD;  [facility];  Service: Gastroenterology;;   POLYPECTOMY  04/01/2021   Procedure: POLYPECTOMY INTESTINAL;  Surgeon: Harvel Quale, MD;  [facility];  Service: Gastroenterology;;   SPLENECTOMY, TOTAL  9/37/1696   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   THYROIDECTOMY, PARTIAL  11/05/2010   Benign disease    SOCIAL HISTORY:  Social History   Socioeconomic History   Marital status: Married    Spouse name: Alroy Dust   Number of children: 2   Years of education: Not on file   Highest education level: Not on file  Occupational History   Not on file  Tobacco Use   Smoking status: Every Day    Packs/day: 0.50    Years: 40.00    Pack years: 20.00    Types: Cigarettes   Smokeless tobacco: Never   Tobacco comments:    1/2 pack a day  Vaping Use   Vaping Use: Never used  Substance and Sexual Activity   Alcohol use: No    Alcohol/week: 0.0 standard drinks    Comment: Encouraged to quit smoking. She has tried the nicotone gum and patches but did not help   Drug use: No   Sexual activity: Yes    Birth control/protection: Surgical  Other Topics Concern   Not on file  Social History Narrative   Not on file   Social Determinants of Health   Financial Resource Strain: Medium Risk   Difficulty of Paying Living Expenses: Somewhat hard  Food Insecurity: No Food Insecurity   Worried About Charity fundraiser in the Last  Year: Never true   Ran Out of Food in the Last Year: Never true  Transportation Needs: No Transportation Needs   Lack of Transportation (Medical): No   Lack of Transportation (Non-Medical): No  Physical Activity: Inactive   Days of Exercise per Week: 0 days   Minutes of Exercise per Session: 0 min  Stress: Stress Concern Present   Feeling of Stress : To some extent  Social Connections: Moderately Integrated   Frequency of Communication with Friends and Family: Three times a week   Frequency of Social Gatherings with Friends and Family: Three times a week   Attends Religious Services: More than 4 times per year   Active Member of Clubs or Organizations: No   Attends Archivist Meetings: Never   Marital Status: Married  Human resources officer Violence: Not At Risk   Fear of Current or Ex-Partner: No   Emotionally Abused: No   Physically Abused: No  Sexually Abused: No    FAMILY HISTORY:  Family History  Problem Relation Age of Onset   Diabetes Mother    Arrhythmia Mother    Heart disease Father    Hyperlipidemia Father    Arrhythmia Father    Cancer Paternal Grandmother        Breast   Diabetes Sister    Diabetes Brother    Heart disease Brother     CURRENT MEDICATIONS:  Current Outpatient Medications  Medication Sig Dispense Refill   aspirin 81 MG EC tablet Take 81 mg by mouth in the morning.     CLOTRIMAZOLE EX Apply 1 application topically daily as needed (itching an breast).     cyclobenzaprine (FLEXERIL) 10 MG tablet TAKE 1 TABLET BY MOUTH EVERY DAY AT BEDTIME AS NEEDED FOR MUSCLE SPASM 30 tablet 4   dexamethasone (DECADRON) 1 MG tablet Take 1 tablet by mouth once at 11 pm, before coming for labs at 8 am the next morning 1 tablet 0   Dulaglutide (TRULICITY) 1.5 JJ/9.4RD SOPN INJECT 1.5MG SUBCUTANEOUSLY ONCE A WEEK 6 mL 3   glucose blood (ONETOUCH VERIO) test strip USE AS DIRECTED 2 TIMES DAILY 200 strip 3   glucose blood test strip See admin instructions.      insulin degludec (TRESIBA FLEXTOUCH) 200 UNIT/ML FlexTouch Pen Inject 26 Units into the skin at bedtime. 18 mL 3   insulin glargine (LANTUS SOLOSTAR) 100 UNIT/ML Solostar Pen      Insulin Pen Needle (PEN NEEDLES) 31G X 6 MM MISC See admin instructions.     Lancets (ONETOUCH DELICA PLUS [id number]) MISC Use 4 lancets per day to check blood sugar 200 each 3   LORazepam (ATIVAN) 1 MG tablet Take 1 mg by mouth 2 (two) times daily as needed (Nausea).     naproxen (NAPROSYN) 500 MG tablet TAKE 1 TABLET(500 MG) BY MOUTH TWICE DAILY WITH A MEAL 40 tablet 0   omeprazole (PRILOSEC) 40 MG capsule TAKE 1 CAPSULE DAILY 90 capsule 1   rosuvastatin (CRESTOR) 20 MG tablet TAKE 1 TABLET DAILY 90 tablet 1   temazepam (RESTORIL) 15 MG capsule Take 1 capsule (15 mg total) by mouth at bedtime as needed for sleep. 90 capsule 1   traZODone (DESYREL) 100 MG tablet TAKE 1 TABLET AT BEDTIME 90 tablet 1   triamterene-hydrochlorothiazide (DYAZIDE) 37.5-25 MG capsule TAKE 1 CAPSULE EVERY       MORNING 90 capsule 1   UNABLE TO FIND CPAP tubing and supplies, face mask Dx: G47.30 1 each 0   venlafaxine XR (EFFEXOR-XR) 75 MG 24 hr capsule Take 1 capsule (75 mg total) by mouth daily with breakfast. TAKE 1 CAPSULE(75 MG) BY MOUTH DAILY 90 capsule 0   Vitamin D, Ergocalciferol, (DRISDOL) 1.25 MG (50000 UNIT) CAPS capsule Take 1 capsule (50,000 Units total) by mouth once a week. 12 capsule 0   No current facility-administered medications for this visit.    ALLERGIES:  Allergies  Allergen Reactions   Penicillins Swelling    Has patient had a PCN reaction causing immediate rash, facial/tongue/throat swelling, SOB or lightheadedness with hypotension: Yes Has patient had a PCN reaction causing severe rash involving mucus membranes or skin necrosis: No Has patient had a PCN reaction that required hospitalization: No  Has patient had a PCN reaction occurring within the last 10 years: No If all of the above answers are "NO", then may  proceed with Cephalosporin use.   Of face and tongue.   Metformin And Related Diarrhea  and Nausea Only    Pt to discontinue med due to intolerance, cramps and diarrhea   Wound Dressing Adhesive Itching and Rash    Dermabond and other surgical glues for incisions   Empagliflozin Other (See Comments)    Yeast infection  Jardiance   Statins Other (See Comments)    Fell bad but on Crestor   Zetia [Ezetimibe] Other (See Comments)    Feels funny    PHYSICAL EXAM:  Performance status (ECOG): 1 - Symptomatic but completely ambulatory  Vitals:   11/11/21 1412  BP: 104/68  Pulse: 98  Resp: 18  Temp: 98.3 F (36.8 C)  SpO2: 94%   Wt Readings from Last 3 Encounters:  11/11/21 198 lb 6.4 oz (90 kg)  10/15/21 198 lb 0.6 oz (89.8 kg)  09/11/21 200 lb (90.7 kg)   Physical Exam Vitals reviewed.  Constitutional:      Appearance: Normal appearance. She is obese.  Cardiovascular:     Rate and Rhythm: Normal rate and regular rhythm.     Pulses: Normal pulses.     Heart sounds: Normal heart sounds.  Pulmonary:     Effort: Pulmonary effort is normal.     Breath sounds: Normal breath sounds.  Neurological:     General: No focal deficit present.     Mental Status: She is alert and oriented to person, place, and time.  Psychiatric:        Mood and Affect: Mood normal.        Behavior: Behavior normal.     LABORATORY DATA:  I have reviewed the labs as listed.  CBC Latest Ref Rng & Units 11/04/2021 01/23/2021 01/20/2021  WBC 4.0 - 10.5 K/uL 17.0(H) 10.1 12.0(H)  Hemoglobin 12.0 - 15.0 g/dL 15.9(H) 12.2 12.8  Hematocrit 36.0 - 46.0 % 46.3(H) 37.4 39.4  Platelets 150 - 400 K/uL 396 303 314   CMP Latest Ref Rng & Units 11/02/2021 09/02/2021 06/15/2021  Glucose 70 - 99 mg/dL - 77 90  BUN 8 - 27 mg/dL - 15 11  Creatinine 0.57 - 1.00 mg/dL - 0.96 1.03(H)  Sodium 134 - 144 mmol/L - 145(H) 142  Potassium 3.5 - 5.1 mEq/L 3.8 4.5 4.7  Chloride 96 - 106 mmol/L - 104 100  CO2 20 - 29 mmol/L -  22 27  Calcium 8.7 - 10.3 mg/dL - 10.0 9.7  Total Protein 6.0 - 8.5 g/dL - - 7.3  Total Bilirubin 0.0 - 1.2 mg/dL - - 0.5  Alkaline Phos 44 - 121 IU/L - - 85  AST 0 - 40 IU/L - - 20  ALT 0 - 32 IU/L - - 15    DIAGNOSTIC IMAGING:  I have independently reviewed the scans and discussed with the patient. MM 3D SCREEN BREAST BILATERAL  Result Date: 11/09/2021 CLINICAL DATA:  Screening. EXAM: DIGITAL SCREENING BILATERAL MAMMOGRAM WITH TOMOSYNTHESIS AND CAD TECHNIQUE: Bilateral screening digital craniocaudal and mediolateral oblique mammograms were obtained. Bilateral screening digital breast tomosynthesis was performed. The images were evaluated with computer-aided detection. COMPARISON:  Previous exam(s). ACR Breast Density Category b: There are scattered areas of fibroglandular density. FINDINGS: There are no findings suspicious for malignancy. IMPRESSION: No mammographic evidence of malignancy. A result letter of this screening mammogram will be mailed directly to the patient. RECOMMENDATION: Screening mammogram in one year. (Code:SM-B-01Y) BI-RADS CATEGORY  1: Negative. Electronically Signed   By: Dorise Bullion III M.D.   On: 11/09/2021 15:45     ASSESSMENT:  Right adrenal nodules: - CT  chest on 04/17/2021 showed 2 right adrenal nodules, measuring 2.6 x 1.7 cm, 3.0 x 1.7 cm compatible with adrenal adenomas. 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 - CT abdomen in May 2013 showed 1.5 cm right adrenal nodule. - She reportedly lost about 15 pounds since March of this year when she was hospitalized with vomiting diarrhea and dehydration.    Social/family history: - Lives at home with her husband. 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 - She smokes half pack per day for 45 years. - Paternal grandmother had breast cancer.  3.  Stage I left breast cancer, ER positive, HER2 positive: - Diagnosed  on 12/10/2005, status post lumpectomy. - Taxotere, Cytoxan plus Herceptin for 6 cycles followed by 1 year of Herceptin. - 5 years of tamoxifen completed November 2012.   PLAN:  Right adrenal nodules: - She had incidental right adrenal nodules on recent CT of the chest.  They are consistent with benign adenomas based on Hounsfield units. - We reviewed results of blood work including 24-hour urinary metanephrines, normetanephrine's, cortisol which were within normal limits. - She will follow-up with Dr. Renne Crigler.  2.  JAK2 and BCR/ABL negative leukocytosis: - Thought to be from combination of smoking and splenectomy. - White count is stable around 17. - Recommend follow-up in 6 months with repeat his CBC.  If there is any significant changes in her white count, will consider bone marrow biopsy.  3.  Left breast cancer: - Mammogram on 11/09/2021 was BI-RADS Category 1.  4.  Smoking history: - Continue yearly screening CT scans.   Orders placed this encounter:  No orders of the defined types were placed in this encounter.    Derek Jack, MD [place] [phone]   I, Thana Ates, am acting as a scribe for Dr. Derek Jack.  I, Derek Jack MD, have reviewed the above documentation for accuracy and completeness, and I agree with the above.

## 2021-11-11 NOTE — Patient Instructions (Signed)
[place] [facility] Discharge Instructions   You were seen and examined today by Dr. Delton Coombes.  He reviewed your lab work which is normal/stable.  The adrenal nodule you have is not causing any issues.  We will see you back in 6 months with repeat blood work prior.    Thank you for choosing [place] at [facility] to provide your oncology and hematology care.  To afford each patient quality time with our provider, please arrive at least 15 minutes before your scheduled appointment time.   If you have a lab appointment with the [facility] please come in thru the Main Entrance and check in at the main information desk.  You need to re-schedule your appointment should you arrive 10 or more minutes late.  We strive to give you quality time with our providers, and arriving late affects you and other patients whose appointments are after yours.  Also, if you no show three or more times for appointments you may be dismissed from the clinic at the providers discretion.     Again, thank you for choosing [facility].  Our hope is that these requests will decrease the amount of time that you wait before being seen by our physicians.       _____________________________________________________________  Should you have questions after your visit to [facility], please contact our office at [phone] and follow the prompts.  Our office hours are 8:00 a.m. and 4:30 p.m. Monday - Friday.  Please note that voicemails left after 4:00 p.m. may not be returned until the following business day.  We are closed weekends and major holidays.  You do have access to a nurse 24-7, just call the main number to the clinic [phone] and do not press any options, hold on the line and a nurse will answer the phone.    For prescription refill requests, have your pharmacy contact our office and allow 72 hours.    Due to Covid, you will  need to wear a mask upon entering the hospital. If you do not have a mask, a mask will be given to you at the Main Entrance upon arrival. For doctor visits, patients may have 1 support person age 67 or older with them. For treatment visits, patients can not have anyone with them due to social distancing guidelines and our immunocompromised population.

## 2021-11-12 ENCOUNTER — Other Ambulatory Visit: Payer: Self-pay | Admitting: Family Medicine

## 2021-11-12 ENCOUNTER — Ambulatory Visit (INDEPENDENT_AMBULATORY_CARE_PROVIDER_SITE_OTHER): Payer: BC Managed Care – PPO | Admitting: Gastroenterology

## 2021-11-12 MED ORDER — TEMAZEPAM 15 MG PO CAPS: 15.0000 mg | ORAL_CAPSULE | Freq: Every evening | ORAL | 0 refills | Status: DC | PRN

## 2021-11-30 ENCOUNTER — Other Ambulatory Visit: Payer: Self-pay | Admitting: Family Medicine

## 2021-12-02 NOTE — Telephone Encounter (Signed)
LMTRC

## 2021-12-03 ENCOUNTER — Other Ambulatory Visit (INDEPENDENT_AMBULATORY_CARE_PROVIDER_SITE_OTHER): Payer: BC Managed Care – PPO

## 2021-12-03 ENCOUNTER — Other Ambulatory Visit: Payer: Self-pay

## 2021-12-03 ENCOUNTER — Ambulatory Visit: Payer: BC Managed Care – PPO

## 2021-12-03 DIAGNOSIS — E278 Other specified disorders of adrenal gland: Secondary | ICD-10-CM

## 2021-12-04 ENCOUNTER — Other Ambulatory Visit: Payer: Self-pay | Admitting: Family Medicine

## 2021-12-07 ENCOUNTER — Ambulatory Visit (INDEPENDENT_AMBULATORY_CARE_PROVIDER_SITE_OTHER): Payer: BC Managed Care – PPO | Admitting: Gastroenterology

## 2021-12-10 LAB — DEXAMETHASONE, BLOOD: Dexamethasone, Serum: 432 ng/dL

## 2021-12-10 LAB — CORTISOL-AM, BLOOD: Cortisol - AM: 2.6 ug/dL — ABNORMAL LOW

## 2021-12-11 ENCOUNTER — Other Ambulatory Visit: Payer: Self-pay | Admitting: Family Medicine

## 2021-12-11 MED ORDER — LORAZEPAM 1 MG PO TABS: ORAL_TABLET | ORAL | 1 refills | Status: DC

## 2021-12-18 ENCOUNTER — Other Ambulatory Visit: Payer: Self-pay | Admitting: Family Medicine

## 2021-12-18 DIAGNOSIS — F5104 Psychophysiologic insomnia: Secondary | ICD-10-CM

## 2021-12-27 ENCOUNTER — Other Ambulatory Visit: Payer: Self-pay | Admitting: Internal Medicine

## 2022-01-19 ENCOUNTER — Other Ambulatory Visit: Payer: Self-pay | Admitting: Family Medicine

## 2022-01-21 ENCOUNTER — Encounter (INDEPENDENT_AMBULATORY_CARE_PROVIDER_SITE_OTHER): Payer: Self-pay

## 2022-01-21 ENCOUNTER — Other Ambulatory Visit (INDEPENDENT_AMBULATORY_CARE_PROVIDER_SITE_OTHER): Payer: Self-pay

## 2022-01-21 ENCOUNTER — Ambulatory Visit (INDEPENDENT_AMBULATORY_CARE_PROVIDER_SITE_OTHER): Payer: BC Managed Care – PPO | Admitting: Gastroenterology

## 2022-01-21 ENCOUNTER — Encounter (INDEPENDENT_AMBULATORY_CARE_PROVIDER_SITE_OTHER): Payer: Self-pay | Admitting: Gastroenterology

## 2022-01-21 VITALS — BP 106/64 | HR 98 | Temp 97.8°F | Ht 66.0 in | Wt 195.5 lb

## 2022-01-21 DIAGNOSIS — R110 Nausea: Secondary | ICD-10-CM

## 2022-01-21 DIAGNOSIS — K580 Irritable bowel syndrome with diarrhea: Secondary | ICD-10-CM | POA: Diagnosis not present

## 2022-01-21 DIAGNOSIS — R6881 Early satiety: Secondary | ICD-10-CM | POA: Diagnosis not present

## 2022-01-21 DIAGNOSIS — G8929 Other chronic pain: Secondary | ICD-10-CM

## 2022-01-21 MED ORDER — ONDANSETRON HCL 4 MG PO TABS: 4.0000 mg | ORAL_TABLET | Freq: Three times a day (TID) | ORAL | 1 refills | Status: DC | PRN

## 2022-01-21 NOTE — Patient Instructions (Signed)
We will get you scheduled for upper endoscopy for further evaluation of your hiatal hernia and nausea ?please avoid NSAIDs (advil, aleve, naproxen, goody powder, ibuprofen) as these can be very hard on your GI tract, causing inflammation, ulcers and damage to the lining of your GI tract.  ?I have sent zofran '4mg'$  for you to take every 8 hours for nausea and vomiting, be mindful these can sometimes cause constipation so try to take sparingly ?You can also try something like benefiber or metamucil over the counter to see if this helps with your constipation/diarrhea ?It may also be beneficial to start a probiotic as this can help regulate the bacteria in your gut and provides some improvement of GI symptoms in some patients ? ? ? ? ?

## 2022-01-21 NOTE — Progress Notes (Signed)
? ?Referring Provider: Fayrene Helper, MD ?Primary Care Physician:  Fayrene Helper, MD ?Primary GI Physician: Jenetta Downer ? ?Chief Complaint  ?Patient presents with  ? Follow-up  ?  Patient here today for a follow up visit. She says for the last month she has had nausea and vomiting. She wants to know if related to her lower abdominal hernia, which at times is painful.  ? ?HPI:  ?Annette [name] is a 61 y.o. female with past medical history of depression, diabetes, GERD, HLD, Hypothyroidism, IBS-D, OSA, breast cancer in remission. ?  ?Patient presenting today for follow up of diarrhea.  ? ?Patient had previous hospital admission April 2022 for severe diarrhea with dehydration, C diff and GI Path panel negative at that time. She underwent flex sig with normal examination of formed stool, celiac testing was also negative. BMs improved thereafter and patient was discharged home with imodium. Patient had recurrence of Diarrhea in May 2022 and was sent a 2 week course of Xifaxan '550mg'$  TID which was never completed, colonoscopy thereafter with findings as below (unremarkable for etiology of diarrhea). Symptoms again resolved after colonoscopy. ? ?Last seen October 2022 for continued diarrhea with 1-3 BMs per day, taking imodium, stools ranging from soft to watery. did report lomotil providing some relief in the past and Abdominal pain that improved after BM. Occasional fecal incontinence, no rectal bleeding. Again, sent two week course of Xifaxan as she did not previously complete it earlier that year. ? ?Today, she states that she is still having diarrhea intermittently, Is taking imodium PRN, took one yesterday, but has not taken any prior to this in a while, she had 3 episodes of diarrhea yesterday. She reports that sometimes she may not have any BMs for a week then she will have more diarrhea thereafter. She has not taken any fiber supplements before. Denies rectal bleeding or melena. Weight and appetite  are stable. States that she has LLQ abdominal pain that is constant, has been present since her back surgery last March, states that having a BM makes pain worse, she does not endorse any alleviating factors. Wonders if pain is related to a hernia as she reports feeling a bugle in the area. Is still unsure if she took previously prescribed Xifaxan, states she does not recall taking anything new after her last visit in October.  ? ?she has had nausea for the past month, she usually can drink something cold and occasionally take an ativan which will help, she will sometimes have vomiting if she does not do interventions soon enough. Nausea will usually ease off after a few hours. Wonders if nausea is related to her hiatal hernia. Reports appetite is stable. Denies weight loss. Blood sugars are running from 100s-200s, reportedly 224 a few days ago. She is on omeprazole for her GERD which seems to be working pretty well for her without breakthrough symptoms, though she does report that on occasion she has to take a second one later in the day if she has eaten. Has been on this medication for some time. Previously on nexium. Denies sore throat, has chronic cough at baseline. Does take naproxen 1-2x/week. Denies any other NSAIDs. Does not drink alcohol. She does endorse some early satiety at times. Eats dinner around 9 pm but goes to bed around midnight, denies any dysphagia or odynophagia.  ? ?Last Colonoscopy:04/01/21- One 9 mm polyp in the cecum, (sessile serrated adenoma)Clips were placed- Two 3 to 5 mm polyps in the ascending colon(tubular adenoma)  removed with a cold snare.- The distal rectum and anal verge are normal on retroflexion view. ?Last Endoscopy:12/19/15- Normal upper third of esophagus, middle third of esophagus and lower third of ?esophagus. Dilated. ?- Esophagogastric landmarks identified. ?- 2 cm hiatal hernia. ?- Normal stomach. ?- Normal duodenal bulb and second portion of the duodenum. ?- No specimens  collected. ? ?Recommendations:  ?Repeat colonoscopy in June 2024 ? ?Past Medical History:  ?Diagnosis Date  ? Arthritis   ? [employer]rees[employer] 10/14/2020  ? Blood transfusion without reported diagnosis   ? [employer] 10/14/2020  ? Breast cancer ([place]) 2007  ? History of  XRT, onTamoxifen  ? Bruises easily   ? Cancer [facility])   ? [employer] 10/14/2020  ? Depression   ? Depression   ? [employer] 10/14/2020  ? Diabetes mellitus   ? prediabetic  ? Diabetes mellitus without complication ([place])   ? [employer] 10/14/2020  ? GERD (gastroesophageal reflux disease)   ? Hypertriglyceridemia   ? Hypothyroidism   ? following chemo amnd radiaition for breast cancer, needed replacement short term  ? Hypothyroidism (acquired)   ? replaced x 1 year  ? IBS (irritable bowel syndrome)   ? Menieres disease   ? Controlled with triamterene   ? Migraines   ? Obesity   ? Palpitations   ? 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[place])   ? Sleep apnea 2010  ? Problems with CPAP  ? ? ?Past Surgical History:  ?Procedure Laterality Date  ? ABDOMINAL EXPOSURE N/A 12/03/2020  ? Procedure: ABDOMINAL EXPOSURE;  Surgeon: Rosetta Posner, MD;  Location: [facility] [place];  Service: Vascular;  Laterality: N/A;  ? ABDOMINAL HYSTERECTOMY  1998  ? Benign, fibroids  ? ADRENALECTOMY  02/16/2012  ? 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 ANTERIOR LUMBAR FUSION N/A 12/03/2020  ? Procedure: Lumbar Four-Five Lumbar Five Sacral One Anterior lumbar interbody fusion;  Surgeon: Ashok Pall, MD;  Location: [place];  Service: Neurosurgery;  Laterality: N/A;  ? APPENDECTOMY  06/12/08  ? BIOPSY  07/02/2020  ? Procedure: BIOPSY;  Surgeon: Harvel Quale, MD;  [facility];  Service: Gastroenterology;;  ? BIOPSY  01/22/2021  ? Procedure: BIOPSY;  Surgeon: Daneil Dolin, MD;  [facility];  Service: Endoscopy;;  ? BREAST LUMPECTOMY Left 2008  ? BREAST RECONSTRUCTION    ? Left reconstructive  ? BREAST SURGERY  2007  ? Left lumpectomy  ? BREAST SURGERY    ? Mammosite - right side  ?  CHOLECYSTECTOMY  1985  ? COLONOSCOPY WITH PROPOFOL N/A 07/02/2020  ? Procedure: COLONOSCOPY WITH PROPOFOL;  Surgeon: Harvel Quale, MD;  [facility];  Service: Gastroenterology;  Laterality: N/A;  1045  ? COLONOSCOPY WITH PROPOFOL N/A 04/01/2021  ? Procedure: COLONOSCOPY WITH PROPOFOL;  Surgeon: Harvel Quale, MD;  [facility];  Service: Gastroenterology;  Laterality: N/A;  12:50  ? ESOPHAGEAL DILATION  12/19/2015  ? Procedure: ESOPHAGEAL DILATION;  Surgeon: Rogene Houston, MD;  [facility];  Service: Endoscopy;;  ? ESOPHAGOGASTRODUODENOSCOPY N/A 12/19/2015  ? Procedure: ESOPHAGOGASTRODUODENOSCOPY (EGD);  Surgeon: Rogene Houston, MD;  [facility];  Service: Endoscopy;  Laterality: N/A;  11:40  ? FLEXIBLE SIGMOIDOSCOPY N/A 01/22/2021  ? Procedure: FLEXIBLE SIGMOIDOSCOPY;  Surgeon: Daneil Dolin, MD;  [facility];  Service: Endoscopy;  Laterality: N/A;  ? HEMOSTASIS CLIP PLACEMENT  04/01/2021  ? Procedure: HEMOSTASIS CLIP PLACEMENT;  Surgeon: Montez Morita, Quillian Quince, MD;  [facility];  Service: Gastroenterology;;  x2  ? POLYPECTOMY  07/02/2020  ? Procedure: POLYPECTOMY;  Surgeon: Harvel Quale, MD;  [facility];  Service: Gastroenterology;;  ? POLYPECTOMY  04/01/2021  ? Procedure: POLYPECTOMY INTESTINAL;  Surgeon: Harvel Quale, MD;  [facility];  Service: Gastroenterology;;  ? 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 THYROIDECTOMY, PARTIAL  11/05/2010  ? Benign disease  ? ? ?Current Outpatient Medications  ?Medication Sig Dispense Refill  ? aspirin 81 MG EC tablet Take 81 mg by mouth in the morning.    ? CLOTRIMAZOLE EX Apply 1 application topically daily as needed (itching an breast).    ? cyclobenzaprine (FLEXERIL) 10 MG tablet TAKE 1 TABLET BY MOUTH EVERY DAY AT BEDTIME AS NEEDED FOR MUSCLE SPASM 30 tablet 4  ? Dulaglutide  (TRULICITY) 1.5 RR/1.1AF SOPN INJECT 1.'5MG'$  SUBCUTANEOUSLY ONCE A WEEK 6 mL 3  ? glucose blood (ONETOUCH VERIO) test strip USE AS DIRECTED 2 TIMES DAILY 200 strip 3  ? glucose blood test strip See admin instruct

## 2022-01-22 ENCOUNTER — Encounter (INDEPENDENT_AMBULATORY_CARE_PROVIDER_SITE_OTHER): Payer: Self-pay

## 2022-01-24 DIAGNOSIS — R6881 Early satiety: Secondary | ICD-10-CM | POA: Insufficient documentation

## 2022-01-26 ENCOUNTER — Encounter: Payer: Self-pay | Admitting: Family Medicine

## 2022-01-26 ENCOUNTER — Ambulatory Visit (INDEPENDENT_AMBULATORY_CARE_PROVIDER_SITE_OTHER): Payer: BC Managed Care – PPO | Admitting: Family Medicine

## 2022-01-26 VITALS — BP 110/67 | HR 94 | Resp 16 | Ht 66.0 in | Wt 194.8 lb

## 2022-01-26 DIAGNOSIS — E785 Hyperlipidemia, unspecified: Secondary | ICD-10-CM | POA: Diagnosis not present

## 2022-01-26 DIAGNOSIS — F411 Generalized anxiety disorder: Secondary | ICD-10-CM

## 2022-01-26 DIAGNOSIS — H8109 Meniere's disease, unspecified ear: Secondary | ICD-10-CM

## 2022-01-26 DIAGNOSIS — E559 Vitamin D deficiency, unspecified: Secondary | ICD-10-CM | POA: Diagnosis not present

## 2022-01-26 DIAGNOSIS — E1169 Type 2 diabetes mellitus with other specified complication: Secondary | ICD-10-CM | POA: Diagnosis not present

## 2022-01-26 DIAGNOSIS — F5104 Psychophysiologic insomnia: Secondary | ICD-10-CM

## 2022-01-26 DIAGNOSIS — R112 Nausea with vomiting, unspecified: Secondary | ICD-10-CM

## 2022-01-26 DIAGNOSIS — F17210 Nicotine dependence, cigarettes, uncomplicated: Secondary | ICD-10-CM

## 2022-01-26 DIAGNOSIS — Z794 Long term (current) use of insulin: Secondary | ICD-10-CM

## 2022-01-26 DIAGNOSIS — K219 Gastro-esophageal reflux disease without esophagitis: Secondary | ICD-10-CM

## 2022-01-26 DIAGNOSIS — E669 Obesity, unspecified: Secondary | ICD-10-CM

## 2022-01-26 NOTE — Patient Instructions (Addendum)
F/U early September, flu vaccine a t visit\ ? ? ?Hope nausea improves ? ? ? ?Lipid, cmp and EGFR and hBA1C today, also TSH and Vit D today and result will be sent to Endo ? ?Lorazepam will be prescribed as before for next 5 months ? ?Thanks for choosing [facility], we consider it a privelige to serve you. ? ? ?

## 2022-01-27 LAB — CMP14+EGFR
ALT: 20 IU/L (ref 0–32)
AST: 24 IU/L (ref 0–40)
Albumin/Globulin Ratio: 1.6 (ref 1.2–2.2)
Albumin: 4.4 g/dL (ref 3.8–4.8)
Alkaline Phosphatase: 97 IU/L (ref 44–121)
BUN/Creatinine Ratio: 15 (ref 12–28)
BUN: 16 mg/dL (ref 8–27)
Bilirubin Total: 0.5 mg/dL (ref 0.0–1.2)
CO2: 25 mmol/L (ref 20–29)
Calcium: 10.2 mg/dL (ref 8.7–10.3)
Chloride: 102 mmol/L (ref 96–106)
Creatinine, Ser: 1.05 mg/dL — ABNORMAL HIGH (ref 0.57–1.00)
Globulin, Total: 2.7 g/dL (ref 1.5–4.5)
Glucose: 108 mg/dL — ABNORMAL HIGH (ref 70–99)
Potassium: 4.7 mmol/L (ref 3.5–5.2)
Sodium: 144 mmol/L (ref 134–144)
Total Protein: 7.1 g/dL (ref 6.0–8.5)
eGFR: 60 mL/min/{1.73_m2} (ref >59)

## 2022-01-27 LAB — LIPID PANEL
Chol/HDL Ratio: 2.5 ratio (ref 0.0–4.4)
Cholesterol, Total: 142 mg/dL (ref 100–199)
HDL: 56 mg/dL (ref >39)
LDL Chol Calc (NIH): 66 mg/dL (ref 0–99)
Triglycerides: 108 mg/dL (ref 0–149)
VLDL Cholesterol Cal: 20 mg/dL (ref 5–40)

## 2022-01-27 LAB — VITAMIN D 25 HYDROXY (VIT D DEFICIENCY, FRACTURES): Vit D, 25-Hydroxy: 56.2 ng/mL (ref 30.0–100.0)

## 2022-01-27 LAB — HEMOGLOBIN A1C
Est. average glucose Bld gHb Est-mCnc: 146 mg/dL
Hgb A1c MFr Bld: 6.7 % — ABNORMAL HIGH (ref 4.8–5.6)

## 2022-01-27 LAB — TSH: TSH: 1.83 u[IU]/mL (ref 0.450–4.500)

## 2022-02-01 ENCOUNTER — Encounter: Payer: Self-pay | Admitting: Family Medicine

## 2022-02-01 MED ORDER — LORAZEPAM 1 MG PO TABS: ORAL_TABLET | ORAL | 1 refills | Status: DC

## 2022-02-01 MED ORDER — TEMAZEPAM 15 MG PO CAPS: 15.0000 mg | ORAL_CAPSULE | Freq: Every evening | ORAL | 1 refills | Status: DC | PRN

## 2022-02-01 NOTE — Assessment & Plan Note (Addendum)
Managed by endo, has upcoming appt, reports recent inc in blood sugar values  ?Annette [name] is reminded of the importance of commitment to daily physical activity for 30 minutes or more, as able and the need to limit carbohydrate intake to 30 to 60 grams per meal to help with blood sugar control.  ? ?The need to take medication as prescribed, test blood sugar as directed, and to call between visits if there is a concern that blood sugar is uncontrolled is also discussed.  ? ?Annette [name] is reminded of the importance of daily foot exam, annual eye examination, and good blood sugar, blood pressure and cholesterol control. ? ? ?  Latest Ref Rng & Units 01/26/2022  ?  4:01 PM 09/02/2021  ?  4:02 PM 06/15/2021  ?  4:15 PM 05/19/2021  ?  5:47 PM 04/23/2021  ?  4:16 PM  ?Diabetic Labs  ?HbA1c 4.8 - 5.6 % 6.7     5.9     ?Micro/Creat Ratio 0 - 29 mg/g creat     <8    ?Chol 100 - 199 mg/dL 142    149      ?HDL >39 mg/dL 56    53      ?Calc LDL 0 - 99 mg/dL 66    75      ?Triglycerides 0 - 149 mg/dL 108    118      ?Creatinine 0.57 - 1.00 mg/dL 1.05   0.96   1.03      ? ? ?  01/26/2022  ?  3:23 PM 01/21/2022  ?  2:21 PM 11/11/2021  ?  2:12 PM 10/15/2021  ?  1:53 PM 09/16/2021  ?  3:33 PM 09/16/2021  ?  2:45 PM 09/11/2021  ?  2:49 PM  ?BP/Weight  ?Systolic BP 263 785 885 027 102 109 98  ?Diastolic BP 67 64 68 84 53 51 60  ?Wt. (Lbs) 194.8 195.5 198.4 198.04   200  ?BMI 31.44 kg/m2 31.55 kg/m2 33.02 kg/m2 32.45 kg/m2   32.28 kg/m2  ? ? ?  08/23/2019  ?  1:20 PM 07/15/2014  ?  1:30 PM  ?Foot/eye exam completion dates  ?Foot Form Completion Done Done  ? ? ? ? ? ?

## 2022-02-01 NOTE — Assessment & Plan Note (Signed)
Hyperlipidemia:Low fat diet discussed and encouraged. ? ? ?Lipid Panel  ?Lab Results  ?Component Value Date  ? CHOL 142 01/26/2022  ? HDL 56 01/26/2022  ? [place] 66 01/26/2022  ? TRIG 108 01/26/2022  ? CHOLHDL 2.5 01/26/2022  ?Controlled, no change in medication ? ? ? ? ?

## 2022-02-01 NOTE — Assessment & Plan Note (Signed)
Asked:confirms currently smokes cigarettes approx 15/day Assess: Unwilling to set a quit date, but is cutting back Advise: needs to QUIT to reduce risk of cancer, cardio and cerebrovascular disease Assist: counseled for 5 minutes and literature provided Arrange: follow up in 2 to 4 months  

## 2022-02-01 NOTE — Assessment & Plan Note (Signed)
Improved continue current meds ?

## 2022-02-01 NOTE — Progress Notes (Signed)
? ?Annette [name]     MRN: [id number]      DOB: 06-26-1961 ? ? ?HPI ?Annette [name] is here for follow up and re-evaluation of chronic medical conditions, medication management and review of any available recent lab and radiology data.  ?Preventive health is updated, specifically  Cancer screening and Immunization.   ?Questions or concerns regarding consultations or procedures which the PT has had in the interim are  addressed. ?The PT denies any adverse reactions to current medications since the last visit.  ?Reports recent increased and uncontrolled blood sugar levels , concerned about control has upcoming Endo appt ?Has been awarded disability so stress slightly reduced ?C/o ongoing abdominal pain and nausea, and vomiting, GI managing ? ?ROS ?Denies recent fever or chills. ?Denies sinus pressure, nasal congestion, ear pain or sore throat. ?Denies chest congestion, productive cough or wheezing. ?Denies chest pains, palpitations and leg swelling ?Marland Kitchen   ?Denies dysuria, frequency, hesitancy or incontinence. ?Denies joint pain, swelling and limitation in mobility. ?Denies headaches, seizures, numbness, or tingling. ?Denies uncontrolled depression, anxiety or insomnia. ?Denies skin break down or rash. ? ? ?PE ? ?BP 110/67   Pulse 94   Resp 16   Ht '5\' 6"'$  (1.676 m)   Wt 194 lb 12.8 oz (88.4 kg)   SpO2 96%   BMI 31.44 kg/m?  ? ?Patient alert and oriented and in no cardiopulmonary distress. ? ?HEENT: No facial asymmetry, EOMI,     Neck supple . ? ?Chest: Clear to auscultation bilaterally.Decreased air entry  ? ?CVS: S1, S2 no murmurs, no S3.Regular rate. ? ?  ? ?Ext: No edema ? ?MS: Adequate ROM spine, shoulders, hips and knees. ? ?Skin: Intact, no ulcerations or rash noted. ? ?Psych: Good eye contact, normal affect. Memory intact not anxious or depressed appearing. ? ?CNS: CN 2-12 intact, power,  normal throughout.no focal deficits noted. ? ? ?Assessment & Plan ? ?Type 2 diabetes mellitus with other specified  complication ([place]) ?Managed by endo, has upcoming appt, reports recent inc in blood sugar values  ?Annette [name] is reminded of the importance of commitment to daily physical activity for 30 minutes or more, as able and the need to limit carbohydrate intake to 30 to 60 grams per meal to help with blood sugar control.  ? ?The need to take medication as prescribed, test blood sugar as directed, and to call between visits if there is a concern that blood sugar is uncontrolled is also discussed.  ? ?Annette [name] is reminded of the importance of daily foot exam, annual eye examination, and good blood sugar, blood pressure and cholesterol control. ? ? ?  Latest Ref Rng & Units 01/26/2022  ?  4:01 PM 09/02/2021  ?  4:02 PM 06/15/2021  ?  4:15 PM 05/19/2021  ?  5:47 PM 04/23/2021  ?  4:16 PM  ?Diabetic Labs  ?HbA1c 4.8 - 5.6 % 6.7     5.9     ?Micro/Creat Ratio 0 - 29 mg/g creat     <8    ?Chol 100 - 199 mg/dL 142    149      ?HDL >39 mg/dL 56    53      ?Calc LDL 0 - 99 mg/dL 66    75      ?Triglycerides 0 - 149 mg/dL 108    118      ?Creatinine 0.57 - 1.00 mg/dL 1.05   0.96   1.03      ? ? ?  01/26/2022  ?  3:23 PM 01/21/2022  ?  2:21 PM 11/11/2021  ?  2:12 PM 10/15/2021  ?  1:53 PM 09/16/2021  ?  3:33 PM 09/16/2021  ?  2:45 PM 09/11/2021  ?  2:49 PM  ?BP/Weight  ?Systolic BP 503 888 280 034 102 109 98  ?Diastolic BP 67 64 68 84 53 51 60  ?Wt. (Lbs) 194.8 195.5 198.4 198.04   200  ?BMI 31.44 kg/m2 31.55 kg/m2 33.02 kg/m2 32.45 kg/m2   32.28 kg/m2  ? ? ?  08/23/2019  ?  1:20 PM 07/15/2014  ?  1:30 PM  ?Foot/eye exam completion dates  ?Foot Form Completion Done Done  ? ? ? ? ? ? ?Nausea with vomiting ?Deteriorated, has upper endo upcoming, continue lorazepam as needed, for uncontrolled nausea, once daily ? ?Meniere's disease ?Managed with diuretic ? ?Hyperlipidemia LDL goal <100 ?Hyperlipidemia:Low fat diet discussed and encouraged. ? ? ?Lipid Panel  ?Lab Results  ?Component Value Date  ? CHOL 142 01/26/2022  ? HDL 56 01/26/2022  ?  [place] 66 01/26/2022  ? TRIG 108 01/26/2022  ? CHOLHDL 2.5 01/26/2022  ?Controlled, no change in medication ? ? ? ? ? ?Cigarette smoker ?Asked:confirms currently smokes cigarettes approx 15/day ?Assess: Unwilling to set a quit date, but is cutting back ?Advise: needs to QUIT to reduce risk of cancer, cardio and cerebrovascular disease ?Assist: counseled for 5 minutes and literature provided ?Arrange: follow up in 2 to 4 months' ? ?GERD (gastroesophageal reflux disease) ?Continue current meds, managed by gI, sub optimal conntrol based on ongoing c/o n/v ? ?Insomnia ?Sleep hygiene reviewed and written information offered also. ?Prescription sent for  medication needed. ? ? ?Obesity (BMI 30.0-34.9) ? ?Patient re-educated about  the importance of commitment to a  minimum of 150 minutes of exercise per week as able. ? ?The importance of healthy food choices with portion control discussed, as well as eating regularly and within a 12 hour window most days. ?The need to choose "clean , green" food 50 to 75% of the time is discussed, as well as to make water the primary drink and set a goal of 64 ounces water daily. ? ?  ? ?  01/26/2022  ?  3:23 PM 01/21/2022  ?  2:21 PM 11/11/2021  ?  2:12 PM  ?Weight /BMI  ?Weight 194 lb 12.8 oz 195 lb 8 oz 198 lb 6.4 oz  ?Height '5\' 6"'$  (1.676 m) '5\' 6"'$  (1.676 m) '5\' 5"'$  (1.651 m)  ?BMI 31.44 kg/m2 31.55 kg/m2 33.02 kg/m2  ? ? ? ? ?GAD (generalized anxiety disorder) ?Improved continue current meds ? ?

## 2022-02-01 NOTE — Assessment & Plan Note (Signed)
Managed with diuretic ?

## 2022-02-01 NOTE — Assessment & Plan Note (Signed)
Deteriorated, has upper endo upcoming, continue lorazepam as needed, for uncontrolled nausea, once daily ?

## 2022-02-01 NOTE — Assessment & Plan Note (Signed)
Sleep hygiene reviewed and written information offered also. Prescription sent for  medication needed.  

## 2022-02-01 NOTE — Assessment & Plan Note (Signed)
Continue current meds, managed by gI, sub optimal conntrol based on ongoing c/o n/v ?

## 2022-02-01 NOTE — Assessment & Plan Note (Signed)
?  Patient re-educated about  the importance of commitment to a  minimum of 150 minutes of exercise per week as able. ? ?The importance of healthy food choices with portion control discussed, as well as eating regularly and within a 12 hour window most days. ?The need to choose "clean , green" food 50 to 75% of the time is discussed, as well as to make water the primary drink and set a goal of 64 ounces water daily. ? ?  ? ?  01/26/2022  ?  3:23 PM 01/21/2022  ?  2:21 PM 11/11/2021  ?  2:12 PM  ?Weight /BMI  ?Weight 194 lb 12.8 oz 195 lb 8 oz 198 lb 6.4 oz  ?Height '5\' 6"'$  (1.676 m) '5\' 6"'$  (1.676 m) '5\' 5"'$  (1.651 m)  ?BMI 31.44 kg/m2 31.55 kg/m2 33.02 kg/m2  ? ? ? ?

## 2022-02-08 ENCOUNTER — Ambulatory Visit: Payer: BC Managed Care – PPO | Admitting: Internal Medicine

## 2022-02-08 ENCOUNTER — Encounter: Payer: Self-pay | Admitting: Internal Medicine

## 2022-02-08 VITALS — BP 98/62 | HR 92 | Ht 66.0 in | Wt 196.4 lb

## 2022-02-08 DIAGNOSIS — Z794 Long term (current) use of insulin: Secondary | ICD-10-CM

## 2022-02-08 DIAGNOSIS — E782 Mixed hyperlipidemia: Secondary | ICD-10-CM

## 2022-02-08 DIAGNOSIS — E278 Other specified disorders of adrenal gland: Secondary | ICD-10-CM

## 2022-02-08 DIAGNOSIS — E1165 Type 2 diabetes mellitus with hyperglycemia: Secondary | ICD-10-CM

## 2022-02-08 LAB — POCT GLYCOSYLATED HEMOGLOBIN (HGB A1C): Hemoglobin A1C: 6.9 % — AB (ref 4.0–5.6)

## 2022-02-08 NOTE — Progress Notes (Signed)
Patient ID: Annette [name], female   DOB: [birth year], 61 y.o.   MRN: [id number]  ? ?This visit occurred during the SARS-CoV-2 public health emergency.  Safety protocols were in place, including screening questions prior to the visit, additional usage of staff PPE, and extensive cleaning of exam room while observing appropriate contact time as indicated for disinfecting solutions.  ? ?HPI: ?Annette [name] is a 61 y.o.-year-old female, initially referred by her PCP, Dr. Moshe Cipro, returning for follow-up for DM2, dx in 2018, insulin-dependent since 03/2019, uncontrolled, without long-term complications.  She previously saw Dr. Dorris Fetch and Dr. Chalmers Cater but then switched to seeing me.  Last visit 5 months ago.  He is here with her husband who offers part of the history. ? ?Interim history: ?She had back surgery (12/2020).  She developed significant leg pain afterwards which slowly improved. ?She continues to have snausea >> worse >> will have an endoscopy. ?She has blurry vision. ? ?Reviewed HbA1c levels: ?Lab Results  ?Component Value Date  ? HGBA1C 6.7 (H) 01/26/2022  ? HGBA1C 5.9 (A) 05/19/2021  ? HGBA1C 6.0 (H) 01/17/2021  ? HGBA1C 6.4 (H) 12/03/2020  ? HGBA1C 6.6 (A) 09/29/2020  ? HGBA1C 7.0 (A) 06/24/2020  ? HGBA1C 9.4 (A) 03/11/2020  ? HGBA1C 7.5 (H) 07/25/2019  ? HGBA1C 8.3 (H) 03/12/2019  ? HGBA1C 8.4 (H) 11/21/2018  ? ?Previously on: ?- Glipizide XL 20 mg before breakfast ?- Lantus 50 units at bedtime (dose increased 04/08/2020) ?She could not tolerate Metformin due to GI intolerance - abdominal pain and diarrhea. ?She had vaginal yeast infections and UTIs from [place]. ?Previously on Tresiba, but unclear why changed to Lantus. ? ?Currently on: ?- Trulicity 9.79 >> 1.5 mg weekly - started 04/2020 -tolerated well ?- Tresiba 50 units at bedtime  >> Toujeo 50 >> 56 units at bedtime >> Tresiba 50 >> 30-46 >> 24 >> 24-30 >> 24-26 units daily ?We stopped glipizide XL 01/2021. ? ?Pt checks her sugars twice a day: ?- am:   62, 79-141 >> 43, 55-99, 162 (steroid) >> 80-112 >> 77, 94, 102-135 ?- 2h after b'fast: n/c ?- before lunch: n/c >> 120 >> n/c ?- 2h after lunch: n/c >> 64 >> n/c ?- before dinner:  60-144 >> 59-152, 162 >> 67, 77-127, 148 >> 62, 65, 70-176, 199 ?- 2h after dinner: n/c >> 133, 143, 177 >> 106-166 >> 125-150 >> n/c ?- bedtime: n/c >> 94 >> n/c ?- nighttime: n/c ?Lowest sugar was 51 >> 58 >> 43 (am) >> 67 >> 62; she has hypoglycemia awareness in the 90s.  ?Highest sugar was 425 ...>> 269 (steroid inj) >> 150 >> 199. ? ?Glucometer: One Touch verio ? ?She is again only eating 1 meal a day per husband's report ?She only drinks unsweetened tea and coffee. ? ?-She has no history of CKD, last BUN/creatinine:  ?Lab Results  ?Component Value Date  ? BUN 16 01/26/2022  ? BUN 15 09/02/2021  ? CREATININE 1.05 (H) 01/26/2022  ? CREATININE 0.96 09/02/2021  ?Not on ACE inhibitor/ARB. ? ?+ HL; last set of lipids: ?Lab Results  ?Component Value Date  ? CHOL 142 01/26/2022  ? HDL 56 01/26/2022  ? [place] 66 01/26/2022  ? TRIG 108 01/26/2022  ? CHOLHDL 2.5 01/26/2022  ?On Crestor 20. She was also on Zetia but she did not feel well on it >> stopped. ? ?- last eye exam was in fall 2020: No DR reportedly.  She sometimes has double vision.  At last visit  I recommended to see Dr. Prudencio Burly, but she did not see them yet. ? ?-no numbness but some tingling in her right foot -after her back surgery ? ?Pt has FH of DM in mother, brother, sister. ? ?She also has a history of adrenal adenomas: ?- she had previous adrenalectomy by Dr. 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     Adrenal cortical adenoma (30 g).  ? ?Received labeled "left adrenal gland" is a 30 g adrenal gland  ?which is received transected by the surgeon which, when  ?reapproximated has an overall measurement of 8.5 x 4.5 x 2.8 cm.  ?The cut surface reveals a central well-circumscribed, yellow  ?cortical  nodule measuring 4 x 3.5 x 2.8 cm. The nodule is focally  ?compressing the adrenal gland. Within the surrounding soft tissue  ?there is a 0.3 cm yellow nodule which has a similar appearance to  ?the cortical nodule representative sections are submitted as  ?follows:  ? ?- before last visit, she was found to have 2 adrenal adenomas, which is investigated by oncology.  They had negative Hounsfield units on CT, consistent with benign nodules. ? ?Hormonal investigation was negative: ?Component ?    Latest Ref Rng 09/07/2021 09/09/2021 11/02/2021  ?Epinephrine, Rand Ur ?    Undefined ug/L  1    ?Epinephrine, U, 24Hr ?    0 - 20 ug/24 hr  2    ?Norepinephrine, Rand Ur ?    Undefined ug/L  19    ?Norepinephrine,U,24H ?    0 - 135 ug/24 hr  29    ?Dopamine, Rand Ur ?    Undefined ug/L  104    ?Dopamine, Ur, 24Hr ?    0 - 510 ug/24 hr  156    ?Total Volume  1,500    ?Total Volume  1,500    ?Normetanephrine, Ur ?    Undefined ug/L  142    ?Normetanephr.,U,24h ?    131 - 612 ug/24 hr  213    ?Metanephrine, Ur ?    Undefined ug/L  26    ?Metanephrines, 24H Ur ?    36 - 209 ug/24 hr  39    ?Norepinephrine ?    0 - 874 pg/mL 745     ?Epinephrine ?    0 - 62 pg/mL 19     ?Dopamine ?    0 - 48 pg/mL 40     ?ALDOSTERONE ?     ng/dL   27   ?Renin Activity ?    0.25 - 5.82 ng/mL/h   13.41 (H)   ?ALDO / PRA Ratio ?    0.9 - 28.9 Ratio   2.0   ?Normetanephrine, Pl ?    0.0 - 285.2 pg/mL 106.9     ?Metanephrine, Pl ?    0.0 - 88.0 pg/mL 10.9     ?K812 ACTH ?    6 - 50 pg/mL 4.2 (L)   23   ?Cortisol, Plasma ?    ug/dL 6.6   44.3   ?Cortisol, Plasma ?       37.2   ?Cortisol, Plasma ?       24.4   ?Potassium ?    3.5 - 5.1 mEq/L   3.8   ? ?Dexamethasone suppression test was normal: ?Component ?    Latest Ref Rng 12/03/2021  ?Dexamethasone, Serum ?    ng/dL 432   ?  Cortisol - AM ?    mcg/dL 2.6 (L)   ?  ?Previous investigation was also negative: ?Component ?    Latest Ref Rng 08/17/2011  ?Total Volume - CF 24Hr U ?    mL 1300   ?Epinephrine, 24 hr  Urine ?    2 - 24 mcg/24 h 4   ?Norepinephrine, 24 hr Ur ?    15 - 100 mcg/24 h 39   ?Calculated Total (E+NE) ?    26 - 121 mcg/24 h 43   ?Dopamine, 24 hr Urine ?    52 - 480 mcg/24 h 139   ?Creatinine, Urine mg/day-CATEUR ?    0.63 - 2.50 g/24 h 1.34   ?Normetanephrine, Ur ?    Undefined ug/L   ?Normetanephr.,U,24h ?    131 - 612 ug/24 hr 318   ?Metanephrine, Ur ?    Undefined ug/L 422   ?Metanephrines, 24H Ur ?    36 - 209 ug/24 hr   ?Metanephrines, Ur ?    90 - 315 mcg/24 h 104   ? ?Component ?    Latest Ref Rng 08/17/2011  ?Cortisol, Plasma ?    ug/dL 14.6   ?Aldosterone, Serum ?     ng/dL 23   ? ?08/17/2011: ?Component Ref Range & Units 10 yr ago  ?Total Volume - CF 24Hr U mL 1300   ?Epinephrine, 24 hr Urine 2 - 24 mcg/24 h 4   ?Norepinephrine, 24 hr Ur 15 - 100 mcg/24 h 39   ?Calculated Total (E+NE) 26 - 121 mcg/24 h 43   ?Dopamine, 24 hr Urine 52 - 480 mcg/24 h 139   ?Creatinine, Urine mg/day-CATEUR 0.63 - 2.50 g/24 h 1.34   ?[place]  ? ?Component Ref Range & Units 10 yr ago  ?Metanephrines, Ur 90 - 315 mcg/24 h 104   ?Comment: This specimen was submitted with a pH greater than 6.0. ?Optimum pH for this assay is 1.0-6.0. Improper ?preservation may compromise the validity of the ?assay.  ?Normetanephrine, 24H Ur 122 - 676 mcg/24 h 318   ?Metaneph Total, Ur 224 - 832 mcg/24 h 422   ?Comment: A four fold elevation of urinary normetanephrines ?is extremely likely to be due to a tumor, while a ?four fold elevation of urinary metanephrines is ?highly suggestive, but not diagnostic of the tumor. ?Measurement of plasma Metanephrines and Chromogranin ?A is recommended for confirmation.  ?Resulting Agency  [facility]  ? ?12/22/2009: ?Cortisol,F,ug/L,U Undefined ug/L 20   ?Cortisol, 24H Ur 0-50 ug/24 hr 50   ? ?She also has a history of HTN, OSA. ?She had COVID-19 10/2019. She recovered well afterwards. ? ?She also has a history of partial thyroidectomy. Reviewed latest TSH: ?Lab Results  ?Component Value  Date  ? TSH 1.830 01/26/2022  ? ? ?ROS: ?+ See HPI ? ?I reviewed pt's medications, allergies, PMH, social hx, family hx, and changes were documented in the history of present illness. Otherwise, unc

## 2022-02-08 NOTE — Patient Instructions (Addendum)
Please continue: - Tresiba 24-26 units daily - Trulicity 1.5 mg weekly  Write comments in your log about the abnormal blood sugars.  Please call and schedule an eye appt with: [facility]:   Address: [address], [place], [place] [address]  [phone]  Please return in 4 months with your sugar log.  

## 2022-02-10 NOTE — Patient Instructions (Signed)
? ? ? ? ? ? Annette [name] ? 02/10/2022  ?  ? '@PREFPERIOPPHARMACY'$ @ ? ? Your procedure is scheduled on  02/17/2022. ? ? Report to [facility] at  0900  A.M. ? ? Call this number if you have problems the morning of surgery: ? [phone] ? ? Remember: ? Follow the diet instructions given to you by the office. ? ?  Take 12 units of your night time insulin the night before your procedure. ? ?   DO NOT take any medications for diabetes the morning of your procedure. ?  ? Take these medicines the morning of surgery with A SIP OF WATER  ? ?                      ativan(if needed), prilosec, effexor. ?  ? ? Do not wear jewelry, make-up or nail polish. ? Do not wear lotions, powders, or perfumes, or deodorant. ? Do not shave 48 hours prior to surgery.  Men may shave face and neck. ? Do not bring valuables to the hospital. ?  is not responsible for any belongings or valuables. ? ?Contacts, dentures or bridgework may not be worn into surgery.  Leave your suitcase in the car.  After surgery it may be brought to your room. ? ?For patients admitted to the hospital, discharge time will be determined by your treatment team. ? ?Patients discharged the day of surgery will not be allowed to drive home and must have someone with them for 24 hours.  ? ? ?Special instructions:   DO NOT smoke tobacco or vape for 24 hours before your procedure. ? ?Please read over the following fact sheets that you were given. ?Anesthesia Post-op Instructions and Care and Recovery After Surgery ?  ? ? ? Upper Endoscopy, Adult, Care After ?This sheet gives you information about how to care for yourself after your procedure. Your health care provider may also give you more specific instructions. If you have problems or questions, contact your health care provider. ?What can I expect after the procedure? ?After the procedure, it is common to have: ?A sore throat. ?Mild stomach pain or discomfort. ?Bloating. ?Nausea. ?Follow these instructions at  home: ? ?Follow instructions from your health care provider about what to eat or drink after your procedure. ?Return to your normal activities as told by your health care provider. Ask your health care provider what activities are safe for you. ?Take over-the-counter and prescription medicines only as told by your health care provider. ?If you were given a sedative during the procedure, it can affect you for several hours. Do not drive or operate machinery until your health care provider says that it is safe. ?Keep all follow-up visits as told by your health care provider. This is important. ?Contact a health care provider if you have: ?A sore throat that lasts longer than one day. ?Trouble swallowing. ?Get help right away if: ?You vomit blood or your vomit looks like coffee grounds. ?You have: ?A fever. ?Bloody, black, or tarry stools. ?A severe sore throat or you cannot swallow. ?Difficulty breathing. ?Severe pain in your chest or abdomen. ?Summary ?After the procedure, it is common to have a sore throat, mild stomach discomfort, bloating, and nausea. ?If you were given a sedative during the procedure, it can affect you for several hours. Do not drive or operate machinery until your health care provider says that it is safe. ?Follow instructions from your health care provider about what to  eat or drink after your procedure. ?Return to your normal activities as told by your health care provider. ?This information is not intended to replace advice given to you by your health care provider. Make sure you discuss any questions you have with your health care provider. ?Document Revised: 07/27/2019 Document Reviewed: 02/20/2018 ?Elsevier Patient Education ? [place]. ?Monitored Anesthesia Care, Care After ?This sheet gives you information about how to care for yourself after your procedure. Your health care provider may also give you more specific instructions. If you have problems or questions, contact your  health care provider. ?What can I expect after the procedure? ?After the procedure, it is common to have: ?Tiredness. ?Forgetfulness about what happened after the procedure. ?Impaired judgment for important decisions. ?Nausea or vomiting. ?Some difficulty with balance. ?Follow these instructions at home: ?For the time period you were told by your health care provider: ? ?  ? ?Rest as needed. ?Do not participate in activities where you could fall or become injured. ?Do not drive or use machinery. ?Do not drink alcohol. ?Do not take sleeping pills or medicines that cause drowsiness. ?Do not make important decisions or sign legal documents. ?Do not take care of children on your own. ?Eating and drinking ?Follow the diet that is recommended by your health care provider. ?Drink enough fluid to keep your urine pale yellow. ?If you vomit: ?Drink water, juice, or soup when you can drink without vomiting. ?Make sure you have little or no nausea before eating solid foods. ?General instructions ?Have a responsible adult stay with you for the time you are told. It is important to have someone help care for you until you are awake and alert. ?Take over-the-counter and prescription medicines only as told by your health care provider. ?If you have sleep apnea, surgery and certain medicines can increase your risk for breathing problems. Follow instructions from your health care provider about wearing your sleep device: ?Anytime you are sleeping, including during daytime naps. ?While taking prescription pain medicines, sleeping medicines, or medicines that make you drowsy. ?Avoid smoking. ?Keep all follow-up visits as told by your health care provider. This is important. ?Contact a health care provider if: ?You keep feeling nauseous or you keep vomiting. ?You feel light-headed. ?You are still sleepy or having trouble with balance after 24 hours. ?You develop a rash. ?You have a fever. ?You have redness or swelling around the IV  site. ?Get help right away if: ?You have trouble breathing. ?You have new-onset confusion at home. ?Summary ?For several hours after your procedure, you may feel tired. You may also be forgetful and have poor judgment. ?Have a responsible adult stay with you for the time you are told. It is important to have someone help care for you until you are awake and alert. ?Rest as told. Do not drive or operate machinery. Do not drink alcohol or take sleeping pills. ?Get help right away if you have trouble breathing, or if you suddenly become confused. ?This information is not intended to replace advice given to you by your health care provider. Make sure you discuss any questions you have with your health care provider. ?Document Revised: 08/25/2021 Document Reviewed: 08/23/2019 ?Elsevier Patient Education ? [place]. ? ?

## 2022-02-12 ENCOUNTER — Encounter (HOSPITAL_COMMUNITY)
Admission: RE | Admit: 2022-02-12 | Discharge: 2022-02-12 | Disposition: A | Discharge status: Home / Self Care | Payer: BC Managed Care – PPO | Source: Ambulatory Visit | Attending: Internal Medicine | Admitting: Internal Medicine

## 2022-02-17 ENCOUNTER — Ambulatory Visit (HOSPITAL_COMMUNITY): Payer: BC Managed Care – PPO | Admitting: Anesthesiology

## 2022-02-17 ENCOUNTER — Ambulatory Visit (HOSPITAL_COMMUNITY)
Admission: AD | Admit: 2022-02-17 | Discharge: 2022-02-17 | Disposition: A | Discharge status: Home / Self Care | Payer: BC Managed Care – PPO | Attending: Internal Medicine | Admitting: Internal Medicine

## 2022-02-17 ENCOUNTER — Encounter (HOSPITAL_COMMUNITY): Payer: Self-pay | Admitting: Internal Medicine

## 2022-02-17 ENCOUNTER — Encounter (HOSPITAL_COMMUNITY)
Admission: AD | Disposition: A | Discharge status: Home / Self Care | Payer: Self-pay | Source: Home / Self Care | Attending: Internal Medicine

## 2022-02-17 DIAGNOSIS — M1990 Unspecified osteoarthritis, unspecified site: Secondary | ICD-10-CM | POA: Diagnosis not present

## 2022-02-17 DIAGNOSIS — R110 Nausea: Secondary | ICD-10-CM

## 2022-02-17 DIAGNOSIS — G629 Polyneuropathy, unspecified: Secondary | ICD-10-CM

## 2022-02-17 DIAGNOSIS — K219 Gastro-esophageal reflux disease without esophagitis: Secondary | ICD-10-CM | POA: Insufficient documentation

## 2022-02-17 DIAGNOSIS — R634 Abnormal weight loss: Secondary | ICD-10-CM | POA: Insufficient documentation

## 2022-02-17 DIAGNOSIS — K580 Irritable bowel syndrome with diarrhea: Secondary | ICD-10-CM | POA: Insufficient documentation

## 2022-02-17 DIAGNOSIS — E669 Obesity, unspecified: Secondary | ICD-10-CM

## 2022-02-17 DIAGNOSIS — K2289 Other specified disease of esophagus: Secondary | ICD-10-CM | POA: Insufficient documentation

## 2022-02-17 DIAGNOSIS — Z7982 Long term (current) use of aspirin: Secondary | ICD-10-CM | POA: Insufficient documentation

## 2022-02-17 DIAGNOSIS — L989 Disorder of the skin and subcutaneous tissue, unspecified: Secondary | ICD-10-CM

## 2022-02-17 DIAGNOSIS — M25552 Pain in left hip: Secondary | ICD-10-CM

## 2022-02-17 DIAGNOSIS — G8918 Other acute postprocedural pain: Secondary | ICD-10-CM

## 2022-02-17 DIAGNOSIS — R6881 Early satiety: Secondary | ICD-10-CM | POA: Insufficient documentation

## 2022-02-17 DIAGNOSIS — Z7985 Long-term (current) use of injectable non-insulin antidiabetic drugs: Secondary | ICD-10-CM | POA: Insufficient documentation

## 2022-02-17 DIAGNOSIS — D759 Disease of blood and blood-forming organs, unspecified: Secondary | ICD-10-CM | POA: Diagnosis not present

## 2022-02-17 DIAGNOSIS — F32A Depression, unspecified: Secondary | ICD-10-CM | POA: Insufficient documentation

## 2022-02-17 DIAGNOSIS — Z6831 Body mass index (BMI) 31.0-31.9, adult: Secondary | ICD-10-CM | POA: Diagnosis not present

## 2022-02-17 DIAGNOSIS — D72829 Elevated white blood cell count, unspecified: Secondary | ICD-10-CM

## 2022-02-17 DIAGNOSIS — E7800 Pure hypercholesterolemia, unspecified: Secondary | ICD-10-CM

## 2022-02-17 DIAGNOSIS — M549 Dorsalgia, unspecified: Secondary | ICD-10-CM

## 2022-02-17 DIAGNOSIS — K449 Diaphragmatic hernia without obstruction or gangrene: Secondary | ICD-10-CM | POA: Diagnosis not present

## 2022-02-17 DIAGNOSIS — E785 Hyperlipidemia, unspecified: Secondary | ICD-10-CM

## 2022-02-17 DIAGNOSIS — Z794 Long term (current) use of insulin: Secondary | ICD-10-CM | POA: Diagnosis not present

## 2022-02-17 DIAGNOSIS — I959 Hypotension, unspecified: Secondary | ICD-10-CM

## 2022-02-17 DIAGNOSIS — F17210 Nicotine dependence, cigarettes, uncomplicated: Secondary | ICD-10-CM | POA: Insufficient documentation

## 2022-02-17 DIAGNOSIS — R0600 Dyspnea, unspecified: Secondary | ICD-10-CM | POA: Insufficient documentation

## 2022-02-17 DIAGNOSIS — R112 Nausea with vomiting, unspecified: Secondary | ICD-10-CM

## 2022-02-17 DIAGNOSIS — G4700 Insomnia, unspecified: Secondary | ICD-10-CM

## 2022-02-17 DIAGNOSIS — M5136 Other intervertebral disc degeneration, lumbar region: Secondary | ICD-10-CM

## 2022-02-17 DIAGNOSIS — D649 Anemia, unspecified: Secondary | ICD-10-CM | POA: Diagnosis not present

## 2022-02-17 DIAGNOSIS — M542 Cervicalgia: Secondary | ICD-10-CM

## 2022-02-17 DIAGNOSIS — E8881 Metabolic syndrome: Secondary | ICD-10-CM

## 2022-02-17 DIAGNOSIS — F17200 Nicotine dependence, unspecified, uncomplicated: Secondary | ICD-10-CM

## 2022-02-17 DIAGNOSIS — R1013 Epigastric pain: Secondary | ICD-10-CM | POA: Insufficient documentation

## 2022-02-17 DIAGNOSIS — R946 Abnormal results of thyroid function studies: Secondary | ICD-10-CM

## 2022-02-17 DIAGNOSIS — Z86010 Personal history of colonic polyps: Secondary | ICD-10-CM

## 2022-02-17 DIAGNOSIS — Z853 Personal history of malignant neoplasm of breast: Secondary | ICD-10-CM

## 2022-02-17 DIAGNOSIS — G4730 Sleep apnea, unspecified: Secondary | ICD-10-CM | POA: Insufficient documentation

## 2022-02-17 DIAGNOSIS — M65832 Other synovitis and tenosynovitis, left forearm: Secondary | ICD-10-CM

## 2022-02-17 DIAGNOSIS — K2970 Gastritis, unspecified, without bleeding: Secondary | ICD-10-CM | POA: Insufficient documentation

## 2022-02-17 DIAGNOSIS — R42 Dizziness and giddiness: Secondary | ICD-10-CM

## 2022-02-17 DIAGNOSIS — F411 Generalized anxiety disorder: Secondary | ICD-10-CM

## 2022-02-17 DIAGNOSIS — E119 Type 2 diabetes mellitus without complications: Secondary | ICD-10-CM | POA: Diagnosis not present

## 2022-02-17 DIAGNOSIS — E876 Hypokalemia: Secondary | ICD-10-CM

## 2022-02-17 DIAGNOSIS — M4326 Fusion of spine, lumbar region: Secondary | ICD-10-CM

## 2022-02-17 DIAGNOSIS — F419 Anxiety disorder, unspecified: Secondary | ICD-10-CM | POA: Diagnosis not present

## 2022-02-17 DIAGNOSIS — Z9189 Other specified personal risk factors, not elsewhere classified: Secondary | ICD-10-CM

## 2022-02-17 DIAGNOSIS — G8929 Other chronic pain: Secondary | ICD-10-CM

## 2022-02-17 DIAGNOSIS — R52 Pain, unspecified: Secondary | ICD-10-CM

## 2022-02-17 DIAGNOSIS — D3500 Benign neoplasm of unspecified adrenal gland: Secondary | ICD-10-CM

## 2022-02-17 DIAGNOSIS — H8109 Meniere's disease, unspecified ear: Secondary | ICD-10-CM

## 2022-02-17 DIAGNOSIS — R0609 Other forms of dyspnea: Secondary | ICD-10-CM

## 2022-02-17 DIAGNOSIS — M25562 Pain in left knee: Secondary | ICD-10-CM

## 2022-02-17 DIAGNOSIS — E278 Other specified disorders of adrenal gland: Secondary | ICD-10-CM

## 2022-02-17 DIAGNOSIS — E1169 Type 2 diabetes mellitus with other specified complication: Secondary | ICD-10-CM

## 2022-02-17 DIAGNOSIS — R002 Palpitations: Secondary | ICD-10-CM

## 2022-02-17 DIAGNOSIS — E559 Vitamin D deficiency, unspecified: Secondary | ICD-10-CM

## 2022-02-17 DIAGNOSIS — R55 Syncope and collapse: Secondary | ICD-10-CM

## 2022-02-17 DIAGNOSIS — E890 Postprocedural hypothyroidism: Secondary | ICD-10-CM

## 2022-02-17 DIAGNOSIS — U071 COVID-19: Secondary | ICD-10-CM

## 2022-02-17 HISTORY — PX: ESOPHAGOGASTRODUODENOSCOPY (EGD) WITH PROPOFOL: SHX5813

## 2022-02-17 HISTORY — PX: BIOPSY: SHX5522

## 2022-02-17 LAB — GLUCOSE, CAPILLARY: Glucose-Capillary: 136 mg/dL — ABNORMAL HIGH (ref 70–99)

## 2022-02-17 SURGERY — ESOPHAGOGASTRODUODENOSCOPY (EGD) WITH PROPOFOL
Anesthesia: General

## 2022-02-17 MED ORDER — PROPOFOL 500 MG/50ML IV EMUL: INTRAVENOUS | Status: DC | PRN

## 2022-02-17 MED ORDER — LACTATED RINGERS IV SOLN: INTRAVENOUS | Status: DC

## 2022-02-17 MED ORDER — PROPOFOL 10 MG/ML IV BOLUS
INTRAVENOUS | Status: DC | PRN
Start: 2022-02-17 — End: 2022-02-17
  Administered 2022-02-17: 40 mg via INTRAVENOUS
  Administered 2022-02-17: 100 mg via INTRAVENOUS
  Administered 2022-02-17: 40 mg via INTRAVENOUS

## 2022-02-17 NOTE — Discharge Instructions (Addendum)
Resume aspirin on 02/18/2022 ?Resume other medications as before ?Resume usual diet ?No driving for 24 hours ?Physician will call with biopsy results. ?

## 2022-02-17 NOTE — Transfer of Care (Signed)
Immediate Anesthesia Transfer of Care Note ? ?Patient: Annette [name] ? ?Procedure(s) Performed: ESOPHAGOGASTRODUODENOSCOPY (EGD) WITH PROPOFOL ?BIOPSY ? ?Patient Location: Short Stay ? ?Anesthesia Type:General ? ?Level of Consciousness: awake ? ?Airway & Oxygen Therapy: Patient Spontanous Breathing ? ?Post-op Assessment: Report given to RN ? ?Post vital signs: Reviewed and stable ? ?Last Vitals:  ?Vitals Value Taken Time  ?BP 95/64 02/17/22 1101  ?Temp 36.4 ?C 02/17/22 1101  ?Pulse 77 02/17/22 1101  ?Resp 12 02/17/22 1101  ?SpO2 93 % 02/17/22 1101  ? ? ?Last Pain:  ?Vitals:  ? 02/17/22 1101  ?TempSrc: Axillary  ?PainSc: 0-No pain  ?   ? ?  ? ?Complications: No notable events documented. ?

## 2022-02-17 NOTE — Anesthesia Postprocedure Evaluation (Signed)
Anesthesia Post Note ? ?Patient: Annette [name] ? ?Procedure(s) Performed: ESOPHAGOGASTRODUODENOSCOPY (EGD) WITH PROPOFOL ?BIOPSY ? ?Patient location during evaluation: Short Stay ?Anesthesia Type: General ?Level of consciousness: awake and alert ?Pain management: pain level controlled ?Vital Signs Assessment: post-procedure vital signs reviewed and stable ?Respiratory status: spontaneous breathing ?Cardiovascular status: blood pressure returned to baseline ?Postop Assessment: no apparent nausea or vomiting ?Anesthetic complications: no ? ? ?No notable events documented. ? ? ?Last Vitals:  ?Vitals:  ? 02/17/22 0936 02/17/22 1101  ?BP: (!) 101/59 95/64  ?Pulse: 75 77  ?Resp: 10 12  ?Temp: 36.7 ?C (!) 36.4 ?C  ?SpO2: 94% 93%  ?  ?Last Pain:  ?Vitals:  ? 02/17/22 1101  ?TempSrc: Axillary  ?PainSc: 0-No pain  ? ? ?  ?  ?  ?  ?  ?  ? ?Annette [name] ? ? ? ? ?

## 2022-02-17 NOTE — H&P (Signed)
Annette [name] is an 61 y.o. female.   ?Chief Complaint: Patient is here for esophagogastroduodenoscopy. ?HPI: Patient is 62 year old Caucasian female with multiple medical problems who presents with over 41-monthhistory of epigastric pain nausea early satiety and she says she has lost 8 pounds.  She is on PPI for GERD.  She says heartburn is well controlled with other symptoms a lot.  She has a history of diarrhea felt to be due to IBS.  She says diarrhea has slowed down but not back to normal.  She denies melena or rectal bleeding.  She states she has been on Trulicity for about 9 months.  She does not feel that it is making her symptoms worse. ?She smokes half to 1 pack of cigarettes per day.  She does not drink alcohol.  She is on low-dose aspirin.  She does not take other OTC NSAIDs.  Last dose was yesterday. ?She is status post cholecystectomy. ? ?Past Medical History:  ?Diagnosis Date  ? Acute blood loss anemia 02/16/2012  ? Arthritis   ? Phreesia 10/14/2020  ? Blood transfusion without reported diagnosis   ? Phreesia 10/14/2020  ? Breast cancer ([facility] 2007  ? History of  XRT, onTamoxifen  ? Bruises easily   ? Cancer ([facility]   ? Phreesia 10/14/2020  ? Depression   ? Depression   ? Phreesia 10/14/2020  ? Diabetes mellitus   ? prediabetic  ? Diabetes mellitus without complication ([facility]   ? Phreesia 10/14/2020  ? GERD (gastroesophageal reflux disease)   ? Hypertriglyceridemia   ? Hypothyroidism   ? following chemo amnd radiaition for breast cancer, needed replacement short term  ? Hypothyroidism (acquired)   ? replaced x 1 year  ? IBS (irritable bowel syndrome)   ? Menieres disease   ? Controlled with triamterene   ? Migraines   ? Obesity   ? Palpitations   ? 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[facility]   ? Sleep apnea 2010  ? Problems with CPAP  ? ? ?Past Surgical History:  ?Procedure Laterality Date  ? ABDOMINAL EXPOSURE N/A 12/03/2020  ? Procedure: ABDOMINAL EXPOSURE;  Surgeon: ERosetta Posner MD;  [facility]  Service:  Vascular;  Laterality: N/A;  ? ABDOMINAL HYSTERECTOMY  1998  ? Benign, fibroids  ? ADRENALECTOMY  02/16/2012  ? 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 ANTERIOR LUMBAR FUSION N/A 12/03/2020  ? Procedure: Lumbar Four-Five Lumbar Five Sacral One Anterior lumbar interbody fusion;  Surgeon: CAshok Pall MD;  [facility]  Service: Neurosurgery;  Laterality: N/A;  ? APPENDECTOMY  06/12/08  ? BIOPSY  07/02/2020  ? Procedure: BIOPSY;  Surgeon: CHarvel Quale MD;  [facility];  Service: Gastroenterology;;  ? BIOPSY  01/22/2021  ? Procedure: BIOPSY;  Surgeon: RDaneil Dolin MD;  [facility];  Service: Endoscopy;;  ? BREAST LUMPECTOMY Left 2008  ? BREAST RECONSTRUCTION    ? Left reconstructive  ? BREAST SURGERY  2007  ? Left lumpectomy  ? BREAST SURGERY    ? Mammosite - right side  ? CHOLECYSTECTOMY  1985  ? COLONOSCOPY WITH PROPOFOL N/A 07/02/2020  ? Procedure: COLONOSCOPY WITH PROPOFOL;  Surgeon: CHarvel Quale MD;  [facility];  Service: Gastroenterology;  Laterality: N/A;  1045  ? COLONOSCOPY WITH PROPOFOL N/A 04/01/2021  ? Procedure: COLONOSCOPY WITH PROPOFOL;  Surgeon: CHarvel Quale MD;  [facility];  Service: Gastroenterology;  Laterality: N/A;  12:50  ? ESOPHAGEAL DILATION  12/19/2015  ?  Procedure: ESOPHAGEAL DILATION;  Surgeon: Rogene Houston, MD;  [facility];  Service: Endoscopy;;  ? ESOPHAGOGASTRODUODENOSCOPY N/A 12/19/2015  ? Procedure: ESOPHAGOGASTRODUODENOSCOPY (EGD);  Surgeon: Rogene Houston, MD;  [facility];  Service: Endoscopy;  Laterality: N/A;  11:40  ? FLEXIBLE SIGMOIDOSCOPY N/A 01/22/2021  ? Procedure: FLEXIBLE SIGMOIDOSCOPY;  Surgeon: Daneil Dolin, MD;  [facility];  Service: Endoscopy;  Laterality: N/A;  ? HEMOSTASIS CLIP PLACEMENT  04/01/2021  ? Procedure: HEMOSTASIS CLIP PLACEMENT;  Surgeon: Montez Morita, Quillian Quince, MD;  [facility];  Service:  Gastroenterology;;  x2  ? POLYPECTOMY  07/02/2020  ? Procedure: POLYPECTOMY;  Surgeon: Harvel Quale, MD;  [facility];  Service: Gastroenterology;;  ? POLYPECTOMY  04/01/2021  ? Procedure: POLYPECTOMY INTESTINAL;  Surgeon: Harvel Quale, MD;  [facility];  Service: Gastroenterology;;  ? 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 THYROIDECTOMY, PARTIAL  11/05/2010  ? Benign disease  ? ? ?Family History  ?Problem Relation Age of Onset  ? Diabetes Mother   ? Arrhythmia Mother   ? Heart disease Father   ? Hyperlipidemia Father   ? Arrhythmia Father   ? Cancer Paternal Grandmother   ?     Breast  ? Diabetes Sister   ? Diabetes Brother   ? Heart disease Brother   ? ?Social History:  reports that she has been smoking cigarettes. She has a 20.00 pack-year smoking history. She has never used smokeless tobacco. She reports that she does not drink alcohol and does not use drugs. ? ?Allergies:  ?Allergies  ?Allergen Reactions  ? Penicillins Swelling  ?  Has patient had a PCN reaction causing immediate rash, facial/tongue/throat swelling, SOB or lightheadedness with hypotension: Yes ?Has patient had a PCN reaction causing severe rash involving mucus membranes or skin necrosis: No ?Has patient had a PCN reaction that required hospitalization: No  ?Has patient had a PCN reaction occurring within the last 10 years: No ?If all of the above answers are "NO", then may proceed with Cephalosporin use. ? ? ?Of face and tongue.  ? Metformin And Related Diarrhea and Nausea Only  ?  Pt to discontinue med due to intolerance, cramps and diarrhea  ? Wound Dressing Adhesive Itching and Rash  ?  Dermabond and other surgical glues for incisions  ? Jardiance [Empagliflozin] Other (See Comments)  ?  Yeast infection ? ?  ? Statins Other (See Comments)  ?  Fell bad but on Crestor  ? Zetia [Ezetimibe] Other (See Comments)  ?  Feels funny  ? ? ?Medications Prior  to Admission  ?Medication Sig Dispense Refill  ? aspirin 81 MG EC tablet Take 81 mg by mouth in the morning.    ? Dulaglutide (TRULICITY) 1.5 OF/7.5ZW SOPN INJECT 1.'5MG'$  SUBCUTANEOUSLY ONCE A WEEK (Patient taking differently: Inject 1.5 mg into the skin every Thursday. INJECT 1.'5MG'$  SUBCUTANEOUSLY ONCE A WEEK) 6 mL 3  ? insulin degludec (TRESIBA FLEXTOUCH) 200 UNIT/ML FlexTouch Pen Inject 26 Units into the skin at bedtime. (Patient taking differently: Inject 24 Units into the skin at bedtime.) 18 mL 3  ? Lancets (ONETOUCH DELICA PLUS CHENID78E) MISC TEST 4 TIMES A DAY 100 each 7  ? LORazepam (ATIVAN) 1 MG tablet Take one tablet by mouth once daily, as needed, for  uncontrolled nausea (Patient taking differently: Take 1 mg by mouth 2 (two) times daily as needed (nausea). for  uncontrolled  nausea) 30 tablet 1  ? naproxen (NAPROSYN) 500 MG tablet TAKE 1 TABLET TWICE A DAY  WITH MEALS (Patient taking differently: Take 500 mg by mouth 2 (two) times daily as needed for moderate pain.) 40 tablet 0  ? omeprazole (PRILOSEC) 40 MG capsule TAKE 1 CAPSULE DAILY 90 capsule 1  ? ondansetron (ZOFRAN) 4 MG tablet Take 1 tablet (4 mg total) by mouth every 8 (eight) hours as needed for nausea or vomiting. 30 tablet 1  ? rosuvastatin (CRESTOR) 20 MG tablet TAKE 1 TABLET DAILY 90 tablet 1  ? [START ON 02/22/2022] temazepam (RESTORIL) 15 MG capsule Take 1 capsule (15 mg total) by mouth at bedtime as needed for sleep. (Patient taking differently: Take 15 mg by mouth at bedtime.) 90 capsule 1  ? traZODone (DESYREL) 100 MG tablet TAKE 1 TABLET AT BEDTIME 90 tablet 1  ? triamterene-hydrochlorothiazide (DYAZIDE) 37.5-25 MG capsule TAKE 1 CAPSULE EVERY       MORNING 90 capsule 1  ? UNABLE TO FIND CPAP tubing and supplies, face mask Dx: G47.30 1 each 0  ? venlafaxine XR (EFFEXOR-XR) 75 MG 24 hr capsule TAKE 1 CAPSULE BY MOUTH EVERY DAY 90 capsule 0  ? cyclobenzaprine (FLEXERIL) 10 MG tablet TAKE 1 TABLET BY MOUTH EVERY DAY AT BEDTIME AS NEEDED  FOR MUSCLE SPASM (Patient not taking: Reported on 02/08/2022) 30 tablet 4  ? glucose blood (ONETOUCH VERIO) test strip USE AS DIRECTED 2 TIMES DAILY 200 strip 3  ? glucose blood test strip See admin instructions.

## 2022-02-17 NOTE — Op Note (Signed)
[facility] ?Patient Name: Annette [name] ?Procedure Date: 02/17/2022 10:26 AM ?MRN: [id number] ?Date of Birth: Oct 19, 1960 ?Attending MD: Hildred Laser , MD ?CSN: [id number] ?Age: 61 ?Admit Type: Outpatient ?Procedure:                Upper GI endoscopy ?Indications:              Epigastric abdominal pain, Early satiety, Nausea,  ?                          Weight loss ?Providers:                Hildred Laser, MD, [place] Page, Raphael Gibney,  ?                          Technician ?Referring MD:              ?Medicines:                Propofol per Anesthesia ?Complications:            No immediate complications. ?Estimated Blood Loss:     Estimated blood loss was minimal. ?Procedure:                Pre-Anesthesia Assessment: ?                          - Prior to the procedure, a History and Physical  ?                          was performed, and patient medications and  ?                          allergies were reviewed. The patient's tolerance of  ?                          previous anesthesia was also reviewed. The risks  ?                          and benefits of the procedure and the sedation  ?                          options and risks were discussed with the patient.  ?                          All questions were answered, and informed consent  ?                          was obtained. Prior Anticoagulants: The patient has  ?                          taken no previous anticoagulant or antiplatelet  ?                          agents except for aspirin. ASA Grade Assessment: II  ?                          - A  patient with mild systemic disease. After  ?                          reviewing the risks and benefits, the patient was  ?                          deemed in satisfactory condition to undergo the  ?                          procedure. ?                          After obtaining informed consent, the endoscope was  ?                          passed under direct vision. Throughout the  ?                           procedure, the patient's blood pressure, pulse, and  ?                          oxygen saturations were monitored continuously. The  ?                          GIF-H190 ([id number]) scope was introduced through the  ?                          mouth, and advanced to the second part of duodenum.  ?                          The upper GI endoscopy was accomplished without  ?                          difficulty. The patient tolerated the procedure  ?                          well. ?Scope In: 10:44:27 AM ?Scope Out: 10:57:13 AM ?Total Procedure Duration: 0 hours 12 minutes 46 seconds  ?Findings: ?     The hypopharynx was normal. ?     The examined esophagus was normal. ?     The Z-line was irregular and was found 34 cm from the incisors. Biopsies  ?     were taken with a cold forceps for histology. The pathology specimen was  ?     placed into Bottle Number 3. ?     A 2 cm hiatal hernia was present. ?     Patchy mild inflammation characterized by congestion (edema) and  ?     granularity and petechial hemorrhages was found in the gastric antrum.  ?     Biopsies were taken with a cold forceps for histology. The pathology  ?     specimen was placed into Bottle Number 2. ?     The exam of the stomach was otherwise normal. ?     The duodenal bulb and second portion of the duodenum were normal.  ?     Biopsies were taken with a cold forceps  for histology. The pathology  ?     specimen was placed into Bottle Number 1. ?Impression:               - Normal hypopharynx. ?                          - Normal esophagus. ?                          - Z-line irregular, 34 cm from the incisors.  ?                          Biopsied. ?                          - 2 cm hiatal hernia. ?                          - Gastritis. Biopsied. ?                          - Normal duodenal bulb and second portion of the  ?                          duodenum. Biopsied. ?Moderate Sedation: ?     Per Anesthesia Care ?Recommendation:           - Patient has  a contact number available for  ?                          emergencies. The signs and symptoms of potential  ?                          delayed complications were discussed with the  ?                          patient. Return to normal activities tomorrow.  ?                          Written discharge instructions were provided to the  ?                          patient. ?                          - Resume previous diet today. ?                          - Continue present medications. ?                          - No aspirin, ibuprofen, naproxen, or other  ?                          non-steroidal anti-inflammatory drugs for 1 day. ?                          - Await pathology results. ?Procedure  Code(s):        --- Professional --- ?                          [phone], Esophagogastroduodenoscopy, flexible,  ?                          transoral; with biopsy, single or multiple ?Diagnosis Code(s):        --- Professional --- ?                          K22.8, Other specified diseases of esophagus ?                          K44.9, Diaphragmatic hernia without obstruction or  ?                          gangrene ?                          K29.70, Gastritis, unspecified, without bleeding ?                          R10.13, Epigastric pain ?                          R68.81, Early satiety ?                          R11.0, Nausea ?                          R63.4, Abnormal weight loss ?CPT copyright 2019 American Medical Association. All rights reserved. ?The codes documented in this report are preliminary and upon coder review may  ?be revised to meet current compliance requirements. ?Hildred Laser, MD ?Hildred Laser, MD ?02/17/2022 11:06:15 AM ?This report has been signed electronically. ?Number of Addenda: 0 ?

## 2022-02-17 NOTE — Anesthesia Preprocedure Evaluation (Signed)
Anesthesia Evaluation  ?Patient identified by MRN, date of birth, ID band ?Patient awake ? ? ? ?Reviewed: ?Allergy & Precautions, NPO status , Patient's Chart, lab work & pertinent test results ? ?Airway ?Mallampati: I ? ?TM Distance: >3 FB ?Neck ROM: Full ? ? ? Dental ? ?(+) Dental Advisory Given, Missing ?  ?Pulmonary ?sleep apnea and Continuous Positive Airway Pressure Ventilation , Current SmokerPatient did not abstain from smoking.,  ?  ?Pulmonary exam normal ?breath sounds clear to auscultation ? ? ? ? ? ? Cardiovascular ?Exercise Tolerance: Good ?+ DOE  ?Normal cardiovascular exam ?Rhythm:Regular Rate:Normal ? ? ?  ?Neuro/Psych ? Headaches, PSYCHIATRIC DISORDERS Anxiety Depression   ? GI/Hepatic ?Neg liver ROS, GERD  Medicated and Controlled,  ?Endo/Other  ?diabetes, Well Controlled, Type 2, Insulin DependentHypothyroidism  ? Renal/GU ?negative Renal ROS  ?negative genitourinary ?  ?Musculoskeletal ? ?(+) Arthritis , Osteoarthritis,   ? Abdominal ?  ?Peds ?negative pediatric ROS ?(+)  Hematology ? ?(+) Blood dyscrasia, anemia ,   ?Anesthesia Other Findings ? ? Reproductive/Obstetrics ?negative OB ROS ? ?  ? ? ? ? ? ? ? ? ? ? ? ? ? ?  ?  ? ? ? ? ? ? ? ? ?Anesthesia Physical ?Anesthesia Plan ? ?ASA: 3 ? ?Anesthesia Plan: General  ? ?Post-op Pain Management: Minimal or no pain anticipated  ? ?Induction: Intravenous ? ?PONV Risk Score and Plan: Propofol infusion ? ?Airway Management Planned: Nasal Cannula and Natural Airway ? ?Additional Equipment:  ? ?Intra-op Plan:  ? ?Post-operative Plan:  ? ?Informed Consent: I have reviewed the patients History and Physical, chart, labs and discussed the procedure including the risks, benefits and alternatives for the proposed anesthesia with the patient or authorized representative who has indicated his/her understanding and acceptance.  ? ? ? ?Dental advisory given ? ?Plan Discussed with: CRNA and Surgeon ? ?Anesthesia Plan Comments:    ? ? ? ? ? ? ?Anesthesia Quick Evaluation ? ?

## 2022-02-18 LAB — SURGICAL PATHOLOGY

## 2022-02-23 ENCOUNTER — Encounter (HOSPITAL_COMMUNITY): Payer: Self-pay | Admitting: Internal Medicine

## 2022-04-12 ENCOUNTER — Other Ambulatory Visit: Payer: Self-pay | Admitting: Internal Medicine

## 2022-04-16 ENCOUNTER — Telehealth: Payer: Self-pay | Admitting: Family Medicine

## 2022-04-16 NOTE — Telephone Encounter (Signed)
Patient needs refill on   LORazepam (ATIVAN) 1 MG tablet

## 2022-04-17 ENCOUNTER — Other Ambulatory Visit: Payer: Self-pay | Admitting: Family Medicine

## 2022-04-19 ENCOUNTER — Other Ambulatory Visit: Payer: Self-pay | Admitting: Family Medicine

## 2022-04-19 ENCOUNTER — Telehealth: Payer: Self-pay | Admitting: Family Medicine

## 2022-04-19 MED ORDER — LORAZEPAM 1 MG PO TABS: ORAL_TABLET | ORAL | 1 refills | Status: DC

## 2022-04-19 NOTE — Telephone Encounter (Signed)
States that she is going out of town Friday & wants to know if she an please get this by Thursday?

## 2022-04-19 NOTE — Telephone Encounter (Signed)
Pt called stating she has been trying to get a refill on LORazepam (ATIVAN) 1 MG tablet . Can you please refill?  Annette [name]

## 2022-04-21 ENCOUNTER — Telehealth: Payer: Self-pay

## 2022-04-21 ENCOUNTER — Other Ambulatory Visit: Payer: Self-pay

## 2022-04-21 DIAGNOSIS — Z794 Long term (current) use of insulin: Secondary | ICD-10-CM

## 2022-04-21 DIAGNOSIS — F5104 Psychophysiologic insomnia: Secondary | ICD-10-CM

## 2022-04-21 DIAGNOSIS — E7800 Pure hypercholesterolemia, unspecified: Secondary | ICD-10-CM

## 2022-04-21 MED ORDER — ONETOUCH DELICA PLUS LANCET33G MISC: 7 refills | Status: DC

## 2022-04-21 MED ORDER — ONDANSETRON HCL 4 MG PO TABS: 4.0000 mg | ORAL_TABLET | Freq: Three times a day (TID) | ORAL | 1 refills | Status: DC | PRN

## 2022-04-21 MED ORDER — CYCLOBENZAPRINE HCL 10 MG PO TABS: ORAL_TABLET | ORAL | 4 refills | Status: DC

## 2022-04-21 MED ORDER — VENLAFAXINE HCL ER 75 MG PO CP24: ORAL_CAPSULE | ORAL | 0 refills | Status: DC

## 2022-04-21 MED ORDER — TRULICITY 1.5 MG/0.5ML ~~LOC~~ SOAJ: SUBCUTANEOUS | 2 refills | Status: DC

## 2022-04-21 MED ORDER — OMEPRAZOLE 40 MG PO CPDR: 40.0000 mg | DELAYED_RELEASE_CAPSULE | Freq: Every day | ORAL | 1 refills | Status: DC

## 2022-04-21 MED ORDER — NAPROXEN 500 MG PO TABS: ORAL_TABLET | ORAL | 0 refills | Status: DC

## 2022-04-21 MED ORDER — TRAZODONE HCL 100 MG PO TABS: ORAL_TABLET | ORAL | 1 refills | Status: DC

## 2022-04-21 MED ORDER — LORAZEPAM 1 MG PO TABS: ORAL_TABLET | ORAL | 1 refills | Status: DC

## 2022-04-21 MED ORDER — GLUCOSE BLOOD VI STRP: 200.0000 | ORAL_STRIP | 1 refills | Status: DC

## 2022-04-21 MED ORDER — ASPIRIN 81 MG PO TBEC
81.0000 mg | DELAYED_RELEASE_TABLET | Freq: Every morning | ORAL | 12 refills | Status: DC
Start: 2022-04-21 — End: 2023-05-29

## 2022-04-21 MED ORDER — OMEPRAZOLE 40 MG PO CPDR: 40.0000 mg | DELAYED_RELEASE_CAPSULE | Freq: Every day | ORAL | 0 refills | Status: DC

## 2022-04-21 MED ORDER — TRESIBA FLEXTOUCH 200 UNIT/ML ~~LOC~~ SOPN: 26.0000 [IU] | PEN_INJECTOR | Freq: Every day | SUBCUTANEOUS | 3 refills | Status: DC

## 2022-04-21 MED ORDER — TRIAMTERENE-HCTZ 37.5-25 MG PO CAPS: 1.0000 | ORAL_CAPSULE | Freq: Every morning | ORAL | 1 refills | Status: DC

## 2022-04-21 MED ORDER — TEMAZEPAM 15 MG PO CAPS: 15.0000 mg | ORAL_CAPSULE | Freq: Every evening | ORAL | 1 refills | Status: DC | PRN

## 2022-04-21 MED ORDER — ONETOUCH VERIO VI STRP: ORAL_STRIP | 3 refills | Status: DC

## 2022-04-21 MED ORDER — ROSUVASTATIN CALCIUM 20 MG PO TABS: 20.0000 mg | ORAL_TABLET | Freq: Every day | ORAL | 1 refills | Status: DC

## 2022-04-21 NOTE — Telephone Encounter (Signed)
Patient called said our office been sending her medicine to the wrong pharmacy. Patient asked for nurse to go through all her medication and resend them.  [facility] [place] [facility] [place]  cyclobenzaprine (FLEXERIL) 10 MG tablet    LORazepam (ATIVAN) 1 MG tablet    naproxen (NAPROSYN) 500 MG tablet    omeprazole (PRILOSEC) 40 MG capsule  rosuvastatin (CRESTOR) 20 MG tablet    temazepam (RESTORIL) 15 MG capsule   traZODone (DESYREL) 100 MG tablet    triamterene-hydrochlorothiazide (DYAZIDE) 37.5-25 MG capsule   venlafaxine XR (EFFEXOR-XR) 75 MG 24 hr capsule   Pharmacy  [facility] [place] [place] [facility]istered [address]  [address], [place] [address]  Phone:  [phone]  Fax:  [phone]

## 2022-04-21 NOTE — Telephone Encounter (Signed)
Meds transferred

## 2022-04-22 ENCOUNTER — Other Ambulatory Visit: Payer: Self-pay

## 2022-04-22 ENCOUNTER — Other Ambulatory Visit: Payer: Self-pay | Admitting: Family Medicine

## 2022-04-22 ENCOUNTER — Telehealth: Payer: Self-pay | Admitting: Family Medicine

## 2022-04-22 MED ORDER — TRAZODONE HCL 100 MG PO TABS: ORAL_TABLET | ORAL | 0 refills | Status: DC

## 2022-04-22 NOTE — Telephone Encounter (Signed)
Temp supply sent

## 2022-04-22 NOTE — Telephone Encounter (Signed)
Patient called in LVM in regard to   traZODone (DESYREL) 100 MG tablet   Patient is asking for emergency script be sent in patient is completely out of med.  Patient wants a call back in regard

## 2022-04-26 ENCOUNTER — Other Ambulatory Visit: Payer: Self-pay

## 2022-04-26 DIAGNOSIS — E1165 Type 2 diabetes mellitus with hyperglycemia: Secondary | ICD-10-CM

## 2022-04-26 MED ORDER — TRESIBA FLEXTOUCH 200 UNIT/ML ~~LOC~~ SOPN: 26.0000 [IU] | PEN_INJECTOR | Freq: Every day | SUBCUTANEOUS | 1 refills | Status: DC

## 2022-05-11 ENCOUNTER — Encounter: Payer: Self-pay | Admitting: Family Medicine

## 2022-05-11 ENCOUNTER — Ambulatory Visit: Payer: BC Managed Care – PPO | Admitting: Family Medicine

## 2022-05-11 ENCOUNTER — Inpatient Hospital Stay: Payer: Self-pay

## 2022-05-11 ENCOUNTER — Inpatient Hospital Stay: Payer: Self-pay | Admitting: Hematology

## 2022-05-11 DIAGNOSIS — R059 Cough, unspecified: Secondary | ICD-10-CM | POA: Diagnosis not present

## 2022-05-11 DIAGNOSIS — B379 Candidiasis, unspecified: Secondary | ICD-10-CM | POA: Diagnosis not present

## 2022-05-11 DIAGNOSIS — J069 Acute upper respiratory infection, unspecified: Secondary | ICD-10-CM | POA: Diagnosis not present

## 2022-05-11 DIAGNOSIS — R0981 Nasal congestion: Secondary | ICD-10-CM

## 2022-05-11 MED ORDER — FLUTICASONE PROPIONATE 50 MCG/ACT NA SUSP: 2.0000 | Freq: Every day | NASAL | 6 refills | Status: DC

## 2022-05-11 MED ORDER — FLUCONAZOLE 150 MG PO TABS: 150.0000 mg | ORAL_TABLET | Freq: Once | ORAL | 0 refills | Status: AC

## 2022-05-11 MED ORDER — SULFAMETHOXAZOLE-TRIMETHOPRIM 800-160 MG PO TABS: 1.0000 | ORAL_TABLET | Freq: Two times a day (BID) | ORAL | 0 refills | Status: DC

## 2022-05-11 MED ORDER — PROMETHAZINE-DM 6.25-15 MG/5ML PO SYRP: 1.2500 mL | ORAL_SOLUTION | Freq: Four times a day (QID) | ORAL | 0 refills | Status: DC | PRN

## 2022-05-11 NOTE — Progress Notes (Signed)
Virtual Visit via Telephone Note   This visit type was conducted due to national recommendations for restrictions regarding the COVID-19 Pandemic (e.g. social distancing) in an effort to limit this patient's exposure and mitigate transmission in our community.  Due to her co-morbid illnesses, this patient is at least at moderate risk for complications without adequate follow up.  This format is felt to be most appropriate for this patient at this time.  The patient did not have access to video technology/had technical difficulties with video requiring transitioning to audio format only (telephone).  All issues noted in this document were discussed and addressed.  No physical exam could be performed with this format.  Please refer to the patient's chart for her  consent to telehealth for [facility].   Evaluation Performed:  Follow-up visit  Date:  05/11/2022   ID:  Annette, [name] 1961-09-02, MRN [id number]  Patient Location: Home Provider Location: Office/Clinic  Participants: Patient Location of Patient: Home Location of Provider: Telehealth Consent was obtain for visit to be over via telehealth. I verified that I am speaking with the correct person using two identifiers.  PCP:  Fayrene Helper, MD   Chief Complaint:  Rica Mast  History of Present Illness:    Annette [name] is a 61 y.o. female with  c/o of nasal congestion, cough,  low grade fever, chills, headaches, loss of appetite, and  chills for 2 days. She admits to sick contact exposure from her sister.   The patient does not have symptoms concerning for COVID-19 infection (fever, chills, cough, or new shortness of breath).   Past Medical, Surgical, Social History, Allergies, and Medications have been Reviewed.  Past Medical History:  Diagnosis Date   Acute blood loss anemia 02/16/2012   Arthritis    Phreesia 10/14/2020   Blood transfusion without reported diagnosis    Phreesia 10/14/2020   Breast cancer  ([place]) 2007   History of  XRT, onTamoxifen   Bruises easily    Cancer ([place])    Phreesia 10/14/2020   Depression    Depression    Phreesia 10/14/2020   Diabetes mellitus    prediabetic   Diabetes mellitus without complication ([place])    Phreesia 10/14/2020   GERD (gastroesophageal reflux disease)    Hypertriglyceridemia    Hypothyroidism    following chemo amnd radiaition for breast cancer, needed replacement short term   Hypothyroidism (acquired)    replaced x 1 year   IBS (irritable bowel syndrome)    Menieres disease    Controlled with triamterene    Migraines    Obesity    Palpitations    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[place])    Sleep apnea 2010   Problems with CPAP   Past Surgical History:  Procedure Laterality Date   ABDOMINAL EXPOSURE N/A 12/03/2020   Procedure: ABDOMINAL EXPOSURE;  Surgeon: Rosetta Posner, MD;  Location: [place];  Service: Vascular;  Laterality: N/A;   ABDOMINAL HYSTERECTOMY  1998   Benign, fibroids   ADRENALECTOMY  02/16/2012   [facility], splemic trauma, 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   ANTERIOR LUMBAR FUSION N/A 12/03/2020   Procedure: Lumbar Four-Five Lumbar Five Sacral One Anterior lumbar interbody fusion;  Surgeon: Ashok Pall, MD;  Location: [place];  Service: Neurosurgery;  Laterality: N/A;   APPENDECTOMY  06/12/08   BIOPSY  07/02/2020   Procedure: BIOPSY;  Surgeon: Harvel Quale, MD;  [facility];  Service: Gastroenterology;;   BIOPSY  01/22/2021   Procedure:  BIOPSY;  Surgeon: Daneil Dolin, MD;  [facility];  Service: Endoscopy;;   BIOPSY  02/17/2022   Procedure: BIOPSY;  Surgeon: Rogene Houston, MD;  [facility];  Service: Endoscopy;;   BREAST LUMPECTOMY Left 2008   BREAST RECONSTRUCTION     Left reconstructive   BREAST SURGERY  2007   Left lumpectomy   BREAST SURGERY     [place] - right side   CHOLECYSTECTOMY  1985   COLONOSCOPY WITH PROPOFOL N/A 07/02/2020   Procedure: COLONOSCOPY WITH PROPOFOL;  Surgeon:  Harvel Quale, MD;  [facility];  Service: Gastroenterology;  Laterality: N/A;  1045   COLONOSCOPY WITH PROPOFOL N/A 04/01/2021   Procedure: COLONOSCOPY WITH PROPOFOL;  Surgeon: Harvel Quale, MD;  [facility];  Service: Gastroenterology;  Laterality: N/A;  12:50   ESOPHAGEAL DILATION  12/19/2015   Procedure: ESOPHAGEAL DILATION;  Surgeon: Rogene Houston, MD;  [facility];  Service: Endoscopy;;   ESOPHAGOGASTRODUODENOSCOPY N/A 12/19/2015   Procedure: ESOPHAGOGASTRODUODENOSCOPY (EGD);  Surgeon: Rogene Houston, MD;  [facility];  Service: Endoscopy;  Laterality: N/A;  11:40   ESOPHAGOGASTRODUODENOSCOPY (EGD) WITH PROPOFOL N/A 02/17/2022   Procedure: ESOPHAGOGASTRODUODENOSCOPY (EGD) WITH PROPOFOL;  Surgeon: Rogene Houston, MD;  [facility];  Service: Endoscopy;  Laterality: N/A;  [place] N/A 01/22/2021   Procedure: FLEXIBLE SIGMOIDOSCOPY;  Surgeon: Daneil Dolin, MD;  [facility];  Service: Endoscopy;  Laterality: N/A;   HEMOSTASIS CLIP PLACEMENT  04/01/2021   Procedure: HEMOSTASIS CLIP PLACEMENT;  Surgeon: Harvel Quale, MD;  [facility];  Service: Gastroenterology;;  x2   POLYPECTOMY  07/02/2020   Procedure: POLYPECTOMY;  Surgeon: Harvel Quale, MD;  [facility];  Service: Gastroenterology;;   POLYPECTOMY  04/01/2021   Procedure: POLYPECTOMY INTESTINAL;  Surgeon: Montez Morita, Quillian Quince, MD;  [facility];  Service: Gastroenterology;;   SPLENECTOMY, TOTAL  6/38/7564   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   cyclobenzaprine (FLEXERIL) 10 MG tablet TAKE 1 TABLET BY MOUTH EVERY DAY AT BEDTIME AS NEEDED FOR MUSCLE SPASM   Dulaglutide (TRULICITY)  1.5 PP/2.9JJ SOPN INJECT 1.'5MG'$  SUBCUTANEOUSLY ONCE A WEEK   fluconazole (DIFLUCAN) 150 MG tablet Take 1 tablet (150 mg total) by mouth once for 1 dose.   fluticasone (FLONASE) 50 MCG/ACT nasal spray Place 2 sprays into both nostrils daily.   glucose blood (ONETOUCH VERIO) test strip USE AS DIRECTED 2 TIMES DAILY   glucose blood test strip 200 each by Other route See admin instructions.   insulin degludec (TRESIBA FLEXTOUCH) 200 UNIT/ML FlexTouch Pen Inject 26 Units into the skin at bedtime.   Insulin Pen Needle (PEN NEEDLES) 31G X 6 MM MISC See admin instructions.   Lancets (ONETOUCH DELICA PLUS [id number]) MISC TEST 4 TIMES A DAY   LORazepam (ATIVAN) 1 MG tablet Take one tablet by mouth once daily, as needed, for  uncontrolled nausea   LORazepam (ATIVAN) 1 MG tablet Take one tablet by mouht once daily, as needed, for nausea   naproxen (NAPROSYN) 500 MG tablet TAKE 1 TABLET TWICE A DAY  WITH MEALS   omeprazole (PRILOSEC) 40 MG capsule Take 1 capsule (40 mg total)  by mouth daily.   ondansetron (ZOFRAN) 4 MG tablet Take 1 tablet (4 mg total) by mouth every 8 (eight) hours as needed for nausea or vomiting.   promethazine-dextromethorphan (PROMETHAZINE-DM) 6.25-15 MG/5ML syrup Take 1.3 mLs by mouth 4 (four) times daily as needed for cough.   rosuvastatin (CRESTOR) 20 MG tablet Take 1 tablet (20 mg total) by mouth daily.   temazepam (RESTORIL) 15 MG capsule Take 1 capsule (15 mg total) by mouth at bedtime as needed for sleep.   traZODone (DESYREL) 100 MG tablet TAKE 1 TABLET AT BEDTIME   triamterene-hydrochlorothiazide (DYAZIDE) 37.5-25 MG capsule Take 1 each (1 capsule total) by mouth every morning.   UNABLE TO FIND CPAP tubing and supplies, face mask Dx: G47.30   venlafaxine XR (EFFEXOR-XR) 75 MG 24 hr capsule TAKE 1 CAPSULE BY MOUTH EVERY DAY   [DISCONTINUED] sulfamethoxazole-trimethoprim (BACTRIM DS) 800-160 MG tablet Take 1 tablet by mouth 2 (two) times daily.     Allergies:   Penicillins,  Metformin and related, Wound dressing adhesive, Jardiance [empagliflozin], Statins, and Zetia [ezetimibe]   ROS:   Please see the history of present illness.     All other systems reviewed and are negative.   Labs/Other Tests and Data Reviewed:    Recent Labs: 11/04/2021: Hemoglobin 15.9; Platelets 396 01/26/2022: ALT 20; BUN 16; Creatinine, Ser 1.05; Potassium 4.7; Sodium 144; TSH 1.830   Recent Lipid Panel Lab Results  Component Value Date/Time   CHOL 142 01/26/2022 04:01 PM   TRIG 108 01/26/2022 04:01 PM   HDL 56 01/26/2022 04:01 PM   CHOLHDL 2.5 01/26/2022 04:01 PM   CHOLHDL 5.0 11/15/2014 11:19 AM   LDLCALC 66 01/26/2022 04:01 PM    Wt Readings from Last 3 Encounters:  02/17/22 196 lb 6.4 oz (89.1 kg)  02/08/22 196 lb 6.4 oz (89.1 kg)  01/26/22 194 lb 12.8 oz (88.4 kg)     Objective:    Vital Signs:  There were no vitals taken for this visit.     ASSESSMENT & PLAN:   URTI Will treat with bactrim for 10 days Recommended symptomatic management and supportative treatments Flonase ordered for nasal congestion Promethazine-DM for cough Diflucan ordered for post antibiotic yeast infection  Time:   Today, I have spent 8 minutes reviewing the chart, including problem list, medications, and with the patient with telehealth technology discussing the above problems.   Medication Adjustments/Labs and Tests Ordered: Current medicines are reviewed at length with the patient today.  Concerns regarding medicines are outlined above.   Tests Ordered: No orders of the defined types were placed in this encounter.   Medication Changes: Meds ordered this encounter  Medications   fluticasone (FLONASE) 50 MCG/ACT nasal spray    Sig: Place 2 sprays into both nostrils daily.    Dispense:  16 g    Refill:  6   promethazine-dextromethorphan (PROMETHAZINE-DM) 6.25-15 MG/5ML syrup    Sig: Take 1.3 mLs by mouth 4 (four) times daily as needed for cough.    Dispense:  118 mL     Refill:  0   DISCONTD: sulfamethoxazole-trimethoprim (BACTRIM DS) 800-160 MG tablet    Sig: Take 1 tablet by mouth 2 (two) times daily.    Dispense:  20 tablet    Refill:  0   fluconazole (DIFLUCAN) 150 MG tablet    Sig: Take 1 tablet (150 mg total) by mouth once for 1 dose.    Dispense:  1 tablet    Refill:  0  sulfamethoxazole-trimethoprim (BACTRIM DS) 800-160 MG tablet    Sig: Take 1 tablet by mouth 2 (two) times daily.    Dispense:  20 tablet    Refill:  0       Disposition:  Follow up  Signed, Alvira Monday, FNP  05/11/2022 5:20 PM     [facility]

## 2022-05-25 ENCOUNTER — Other Ambulatory Visit: Payer: Self-pay | Admitting: Family Medicine

## 2022-05-25 DIAGNOSIS — F5104 Psychophysiologic insomnia: Secondary | ICD-10-CM

## 2022-05-26 ENCOUNTER — Other Ambulatory Visit: Payer: Self-pay | Admitting: Internal Medicine

## 2022-05-28 ENCOUNTER — Emergency Department (HOSPITAL_COMMUNITY): Payer: BC Managed Care – PPO

## 2022-05-28 ENCOUNTER — Emergency Department (HOSPITAL_COMMUNITY)
Admission: EM | Admit: 2022-05-28 | Discharge: 2022-05-28 | Disposition: A | Discharge status: Home / Self Care | Payer: BC Managed Care – PPO | Attending: Emergency Medicine | Admitting: Emergency Medicine

## 2022-05-28 ENCOUNTER — Other Ambulatory Visit: Payer: Self-pay

## 2022-05-28 ENCOUNTER — Encounter (HOSPITAL_COMMUNITY): Payer: Self-pay

## 2022-05-28 DIAGNOSIS — D72829 Elevated white blood cell count, unspecified: Secondary | ICD-10-CM | POA: Diagnosis not present

## 2022-05-28 DIAGNOSIS — Z794 Long term (current) use of insulin: Secondary | ICD-10-CM | POA: Diagnosis not present

## 2022-05-28 DIAGNOSIS — M546 Pain in thoracic spine: Secondary | ICD-10-CM | POA: Diagnosis not present

## 2022-05-28 DIAGNOSIS — Z7982 Long term (current) use of aspirin: Secondary | ICD-10-CM | POA: Insufficient documentation

## 2022-05-28 DIAGNOSIS — Z79899 Other long term (current) drug therapy: Secondary | ICD-10-CM | POA: Insufficient documentation

## 2022-05-28 DIAGNOSIS — R519 Headache, unspecified: Secondary | ICD-10-CM | POA: Diagnosis not present

## 2022-05-28 DIAGNOSIS — R0981 Nasal congestion: Secondary | ICD-10-CM | POA: Diagnosis not present

## 2022-05-28 DIAGNOSIS — E119 Type 2 diabetes mellitus without complications: Secondary | ICD-10-CM | POA: Insufficient documentation

## 2022-05-28 DIAGNOSIS — R059 Cough, unspecified: Secondary | ICD-10-CM | POA: Diagnosis not present

## 2022-05-28 DIAGNOSIS — E039 Hypothyroidism, unspecified: Secondary | ICD-10-CM | POA: Insufficient documentation

## 2022-05-28 DIAGNOSIS — M542 Cervicalgia: Secondary | ICD-10-CM

## 2022-05-28 LAB — BASIC METABOLIC PANEL
Anion gap: 10 (ref 5–15)
BUN: 14 mg/dL (ref 8–23)
CO2: 28 mmol/L (ref 22–32)
Calcium: 9.8 mg/dL (ref 8.9–10.3)
Chloride: 101 mmol/L (ref 98–111)
Creatinine, Ser: 0.98 mg/dL (ref 0.44–1.00)
GFR, Estimated: >60 mL/min (ref >60)
Glucose, Bld: 118 mg/dL — ABNORMAL HIGH (ref 70–99)
Potassium: 3.2 mmol/L — ABNORMAL LOW (ref 3.5–5.1)
Sodium: 139 mmol/L (ref 135–145)

## 2022-05-28 LAB — CBC WITH DIFFERENTIAL/PLATELET
Abs Immature Granulocytes: 0.1 10*3/uL — ABNORMAL HIGH (ref 0.00–0.07)
Basophils Absolute: 0.1 10*3/uL (ref 0.0–0.1)
Basophils Relative: 1 %
Eosinophils Absolute: 0.2 10*3/uL (ref 0.0–0.5)
Eosinophils Relative: 1 %
HCT: 49.4 % — ABNORMAL HIGH (ref 36.0–46.0)
Hemoglobin: 16.7 g/dL — ABNORMAL HIGH (ref 12.0–15.0)
Immature Granulocytes: 1 %
Lymphocytes Relative: 29 %
Lymphs Abs: 5.6 10*3/uL — ABNORMAL HIGH (ref 0.7–4.0)
MCH: 32.9 pg (ref 26.0–34.0)
MCHC: 33.8 g/dL (ref 30.0–36.0)
MCV: 97.4 fL (ref 80.0–100.0)
Monocytes Absolute: 1.4 10*3/uL — ABNORMAL HIGH (ref 0.1–1.0)
Monocytes Relative: 7 %
Neutro Abs: 11.6 10*3/uL — ABNORMAL HIGH (ref 1.7–7.7)
Neutrophils Relative %: 61 %
Platelets: 399 10*3/uL (ref 150–400)
RBC: 5.07 MIL/uL (ref 3.87–5.11)
RDW: 13.2 % (ref 11.5–15.5)
WBC: 19.1 10*3/uL — ABNORMAL HIGH (ref 4.0–10.5)
nRBC: 0 % (ref 0.0–0.2)

## 2022-05-28 LAB — CBG MONITORING, ED: Glucose-Capillary: 110 mg/dL — ABNORMAL HIGH (ref 70–99)

## 2022-05-28 MED ORDER — HYDROMORPHONE HCL 1 MG/ML IJ SOLN
1.0000 mg | Freq: Once | INTRAMUSCULAR | Status: AC
  Administered 2022-05-28: 1 mg via INTRAVENOUS
  Filled 2022-05-28: qty 1

## 2022-05-28 MED ORDER — METHOCARBAMOL 500 MG PO TABS: 500.0000 mg | ORAL_TABLET | Freq: Two times a day (BID) | ORAL | 0 refills | Status: DC

## 2022-05-28 MED ORDER — ONDANSETRON 8 MG PO TBDP: 8.0000 mg | ORAL_TABLET | Freq: Three times a day (TID) | ORAL | 0 refills | Status: DC | PRN

## 2022-05-28 MED ORDER — KETOROLAC TROMETHAMINE 10 MG PO TABS: 10.0000 mg | ORAL_TABLET | Freq: Four times a day (QID) | ORAL | 0 refills | Status: DC | PRN

## 2022-05-28 MED ORDER — ONDANSETRON HCL 4 MG/2ML IJ SOLN
4.0000 mg | Freq: Once | INTRAMUSCULAR | Status: AC
  Administered 2022-05-28: 4 mg via INTRAVENOUS
  Filled 2022-05-28: qty 2

## 2022-05-28 MED ORDER — KETOROLAC TROMETHAMINE 30 MG/ML IJ SOLN
30.0000 mg | Freq: Once | INTRAMUSCULAR | Status: AC
  Administered 2022-05-28: 30 mg via INTRAVENOUS
  Filled 2022-05-28: qty 1

## 2022-05-28 MED ORDER — KETOROLAC TROMETHAMINE 30 MG/ML IJ SOLN: 15.0000 mg | Freq: Once | INTRAMUSCULAR | Status: DC

## 2022-05-28 MED ORDER — IOHEXOL 350 MG/ML SOLN
75.0000 mL | Freq: Once | INTRAVENOUS | Status: AC | PRN
  Administered 2022-05-28: 75 mL via INTRAVENOUS

## 2022-05-28 MED ORDER — DIAZEPAM 5 MG/ML IJ SOLN
10.0000 mg | Freq: Once | INTRAMUSCULAR | Status: AC
  Administered 2022-05-28: 10 mg via INTRAVENOUS
  Filled 2022-05-28: qty 2

## 2022-05-28 NOTE — ED Triage Notes (Signed)
Pt to ED via POV c/o neck pain x 1 month, progressively getting worse. No known injury. Hx migraines.

## 2022-05-28 NOTE — Discharge Instructions (Addendum)
Follow-up with your primary care doctor in 3 days.  The CTA head and neck were negative for any acute process.  Take Toradol and Zofran as prescribed.  Take the Robaxin at night as a muscle relaxer.  Return for loss of vision, weakness on one side of your body, fever or new symptoms.

## 2022-05-28 NOTE — ED Provider Notes (Signed)
[facility] EMERGENCY DEPARTMENT Provider Note   CSN: [id number] Arrival date & time: 05/28/22  1643     History  Chief Complaint  Patient presents with   Neck Pain    Annette [name] is a 61 y.o. female.   Neck Pain    Patient with medical history of Mnire's disease, migraines, hypothyroid, diabetes presents today due to neck pain.  This started a month ago, initially she thought she slept on it wrong.  It was worse in the mornings, worse throughout the day with movement but not debilitating.  Associated with headache which is to the posterior occiput.  Does not take any pain medicine because "they do not work".  States today she is unable to move the neck at all without significant pain.  The pain is to the posterior neck, she also feels like there is a strange sensation on the front part of her throat.  Not having any vision changes, lateralized weakness or numbness, chest pain, shortness of breath, dizziness, fevers.    Patient states she has been feeling sick the past few weeks, endorses cough, nasal congestion.  Diagnosed with a URI.  Home Medications Prior to Admission medications   Medication Sig Start Date End Date Taking? Authorizing Provider  ketorolac (TORADOL) 10 MG tablet Take 1 tablet (10 mg total) by mouth every 6 (six) hours as needed. 05/28/22  Yes Sherrill Raring, PA-C  methocarbamol (ROBAXIN) 500 MG tablet Take 1 tablet (500 mg total) by mouth 2 (two) times daily. 05/28/22  Yes Sherrill Raring, PA-C  ondansetron (ZOFRAN-ODT) 8 MG disintegrating tablet Take 1 tablet (8 mg total) by mouth every 8 (eight) hours as needed. 05/28/22  Yes Sherrill Raring, PA-C  aspirin EC 81 MG tablet Take 1 tablet (81 mg total) by mouth in the morning. 04/21/22   Fayrene Helper, MD  BD PEN NEEDLE NANO 2ND GEN 32G X 4 MM MISC USE ONCE DAILY 05/27/22   Philemon Kingdom, MD  Dulaglutide (TRULICITY) 1.5 EH/2.1YY [facility] INJECT 1.'5MG'$  SUBCUTANEOUSLY ONCE A WEEK 04/21/22   Fayrene Helper, MD   fluticasone (FLONASE) 50 MCG/ACT nasal spray Place 2 sprays into both nostrils daily. 05/11/22   Alvira Monday, FNP  glucose blood (ONETOUCH VERIO) test strip USE AS DIRECTED 2 TIMES DAILY 04/21/22   Fayrene Helper, MD  glucose blood test strip 200 each by Other route See admin instructions. 04/21/22   Fayrene Helper, MD  insulin degludec (TRESIBA FLEXTOUCH) 200 UNIT/ML FlexTouch Pen Inject 26 Units into the skin at bedtime. 04/26/22   Philemon Kingdom, MD  Insulin Pen Needle (PEN NEEDLES) 31G X 6 MM MISC See admin instructions. 01/31/20   [provider]  Lancets (ONETOUCH DELICA PLUS [id number]) [place] TEST 4 TIMES A DAY 04/21/22   Fayrene Helper, MD  LORazepam (ATIVAN) 1 MG tablet Take one tablet by mouth once daily, as needed, for  uncontrolled nausea 04/21/22   Fayrene Helper, MD  LORazepam (ATIVAN) 1 MG tablet Take one tablet by mouht once daily, as needed, for nausea 04/21/22   Fayrene Helper, MD  omeprazole (PRILOSEC) 40 MG capsule Take 1 capsule (40 mg total) by mouth daily. 04/21/22   Fayrene Helper, MD  ondansetron (ZOFRAN) 4 MG tablet Take 1 tablet (4 mg total) by mouth every 8 (eight) hours as needed for nausea or vomiting. 04/21/22   Fayrene Helper, MD  promethazine-dextromethorphan (PROMETHAZINE-DM) 6.25-15 MG/5ML syrup Take 1.3 mLs by mouth 4 (four) times daily as needed  for cough. 05/11/22   Alvira Monday, FNP  rosuvastatin (CRESTOR) 20 MG tablet Take 1 tablet (20 mg total) by mouth daily. 04/21/22   Fayrene Helper, MD  sulfamethoxazole-trimethoprim (BACTRIM DS) 800-160 MG tablet Take 1 tablet by mouth 2 (two) times daily. 05/11/22   Alvira Monday, FNP  temazepam (RESTORIL) 15 MG capsule Take 1 capsule (15 mg total) by mouth at bedtime as needed for sleep. 04/21/22   Fayrene Helper, MD  traZODone (DESYREL) 100 MG tablet TAKE 1 TABLET AT BEDTIME 04/22/22   Fayrene Helper, MD  triamterene-hydrochlorothiazide (DYAZIDE) 37.5-25 MG capsule  Take 1 each (1 capsule total) by mouth every morning. 04/21/22   Fayrene Helper, MD  UNABLE TO FIND CPAP tubing and supplies, face mask Dx: G47.30 06/16/21   Fayrene Helper, MD  venlafaxine XR (EFFEXOR-XR) 75 MG 24 hr capsule TAKE 1 CAPSULE BY MOUTH EVERY DAY 05/25/22   Fayrene Helper, MD      Allergies    Penicillins, Metformin and related, Wound dressing adhesive, Jardiance [empagliflozin], Statins, and Zetia [ezetimibe]    Review of Systems   Review of Systems  Musculoskeletal:  Positive for neck pain.    Physical Exam Updated Vital Signs BP 114/61 (BP Location: Right Arm)   Pulse 85   Temp (!) 97.5 F (36.4 C) (Oral)   Resp 16   Ht '5\' 6"'$  (1.676 m)   Wt 86.2 kg   SpO2 100%   BMI 30.67 kg/m  Physical Exam Vitals and nursing note reviewed. Exam conducted with a chaperone present.  Constitutional:      Appearance: Normal appearance.  HENT:     Head: Normocephalic and atraumatic.  Eyes:     General: No scleral icterus.       Right eye: No discharge.        Left eye: No discharge.     Extraocular Movements: Extraocular movements intact.     Pupils: Pupils are equal, round, and reactive to light.  Neck:     Comments: Decreased ROM secondary to pain to the cervical spine.  No nuchal rigidity or rashes, do not appreciate any bruits. Cardiovascular:     Rate and Rhythm: Normal rate and regular rhythm.     Pulses: Normal pulses.     Heart sounds: Normal heart sounds. No murmur heard.    No friction rub. No gallop.  Pulmonary:     Effort: Pulmonary effort is normal. No respiratory distress.     Breath sounds: Normal breath sounds.  Abdominal:     General: Abdomen is flat. Bowel sounds are normal. There is no distension.     Palpations: Abdomen is soft.     Tenderness: There is no abdominal tenderness.  Musculoskeletal:     Cervical back: Tenderness present.  Skin:    General: Skin is warm and dry.     Coloration: Skin is not jaundiced.  Neurological:      Mental Status: She is alert. Mental status is at baseline.     Coordination: Coordination normal.     Comments: Cranial nerves II through XII are grossly intact.  Grip strength is equal bilaterally, lower extremity strength equal bilaterally.  EOMI.     ED Results / Procedures / Treatments   Labs (all labs ordered are listed, but only abnormal results are displayed) Labs Reviewed  BASIC METABOLIC PANEL - Abnormal; Notable for the following components:      Result Value   Potassium 3.2 (*)  Glucose, Bld 118 (*)    All other components within normal limits  CBC WITH DIFFERENTIAL/PLATELET - Abnormal; Notable for the following components:   WBC 19.1 (*)    Hemoglobin 16.7 (*)    HCT 49.4 (*)    Neutro Abs 11.6 (*)    Lymphs Abs 5.6 (*)    Monocytes Absolute 1.4 (*)    Abs Immature Granulocytes 0.10 (*)    All other components within normal limits  CBG MONITORING, ED - Abnormal; Notable for the following components:   Glucose-Capillary 110 (*)    All other components within normal limits    EKG None  Radiology DG Chest Portable 1 View  Result Date: 05/28/2022 CLINICAL DATA:  Cough EXAM: PORTABLE CHEST 1 VIEW COMPARISON:  11/01/2019 FINDINGS: The heart size and mediastinal contours are within normal limits. Both lungs are clear. The visualized skeletal structures are unremarkable. Surgical clips noted within the left axilla and left breast. IMPRESSION: No active disease. Electronically Signed   By: Fidela Salisbury M.D.   On: 05/28/2022 22:42   CT ANGIO HEAD NECK W WO CM  Result Date: 05/28/2022 CLINICAL DATA:  Sudden severe headache EXAM: CT ANGIOGRAPHY HEAD AND NECK TECHNIQUE: Multidetector CT imaging of the head and neck was performed using the standard protocol during bolus administration of intravenous contrast. Multiplanar CT image reconstructions and MIPs were obtained to evaluate the vascular anatomy. Carotid stenosis measurements (when applicable) are obtained utilizing  NASCET criteria, using the distal internal carotid diameter as the denominator. RADIATION DOSE REDUCTION: This exam was performed according to the departmental dose-optimization program which includes automated exposure control, adjustment of the mA and/or kV according to patient size and/or use of iterative reconstruction technique. CONTRAST:  56m OMNIPAQUE IOHEXOL 350 MG/ML SOLN COMPARISON:  None Available. FINDINGS: CT HEAD FINDINGS Brain: There is no mass, hemorrhage or extra-axial collection. The size and configuration of the ventricles and extra-axial CSF spaces are normal. There is no acute or chronic infarction. The brain parenchyma is normal. Skull: The visualized skull base, calvarium and extracranial soft tissues are normal. Sinuses/Orbits: No fluid levels or advanced mucosal thickening of the visualized paranasal sinuses. No mastoid or middle ear effusion. The orbits are normal. CTA NECK FINDINGS SKELETON: There is no bony spinal canal stenosis. No lytic or blastic lesion. OTHER NECK: Normal pharynx, larynx and major salivary glands. No cervical lymphadenopathy. Unremarkable thyroid gland. UPPER CHEST: No pneumothorax or pleural effusion. No nodules or masses. AORTIC ARCH: There is no calcific atherosclerosis of the aortic arch. There is no aneurysm, dissection or hemodynamically significant stenosis of the visualized portion of the aorta. Conventional 3 vessel aortic branching pattern. The visualized proximal subclavian arteries are widely patent. RIGHT CAROTID SYSTEM: Normal without aneurysm, dissection or stenosis. LEFT CAROTID SYSTEM: Normal without aneurysm, dissection or stenosis. VERTEBRAL ARTERIES: Left dominant configuration. Both origins are clearly patent. There is no dissection, occlusion or flow-limiting stenosis to the skull base (V1-V3 segments). CTA HEAD FINDINGS POSTERIOR CIRCULATION: --Vertebral arteries: Normal V4 segments. --Inferior cerebellar arteries: Normal. --Basilar artery:  Normal. --Superior cerebellar arteries: Normal. --Posterior cerebral arteries (PCA): Normal. ANTERIOR CIRCULATION: --Intracranial internal carotid arteries: Normal. --Anterior cerebral arteries (ACA): Normal. Both A1 segments are present. Patent anterior communicating artery (a-comm). --Middle cerebral arteries (MCA): Normal. VENOUS SINUSES: As permitted by contrast timing, patent. ANATOMIC VARIANTS: None Review of the MIP images confirms the above findings. IMPRESSION: Normal CTA of the head and neck. Electronically Signed   By: KUlyses JarredM.D.   On: 05/28/2022 21:18  Procedures Procedures    Medications Ordered in ED Medications  HYDROmorphone (DILAUDID) injection 1 mg (1 mg Intravenous Given 05/28/22 2003)  ondansetron (ZOFRAN) injection 4 mg (4 mg Intravenous Given 05/28/22 2003)  iohexol (OMNIPAQUE) 350 MG/ML injection 75 mL (75 mLs Intravenous Contrast Given 05/28/22 2054)  diazepam (VALIUM) injection 10 mg (10 mg Intravenous Given 05/28/22 2131)  ketorolac (TORADOL) 30 MG/ML injection 30 mg (30 mg Intravenous Given 05/28/22 2129)    ED Course/ Medical Decision Making/ A&P                           Medical Decision Making Amount and/or Complexity of Data Reviewed Labs: ordered. Radiology: ordered.  Risk Prescription drug management.   Patient presents with neck pain and headache.  Differential includes but not limited to carotid dissection, CVA, intracranial hemorrhage, meningitis, muscle skeletal pain, torticollis.  On exam she has reproducible pain to the cervical spine diffusely, decreased ROM secondary to pain.  No focal deficits neurologically, I do not auscultate any bruits.  Patient is a for bile, there is no visual changes and no lateralized weakness. CVA TIA less likely.  Not febrile, no meningismus and pain is reproducible so I do not think this is likely meningitis  I ordered and viewed laboratory work-up. -CBC is notable for leukocytosis of 19.1 left shift 11.6.   Hemoglobin also elevated 16.7.  Patient has leukocytosis explains ago and labs were most recently checked. -BMP without gross electrolyte derangement or AKI.  Mild hypokalemia at 3.2. -CBG elevated at 110  I ordered and reviewed CT head and neck with contrast.  No acute process noted, agree with radiologist interpretation.  Chest x-ray given chronic cough, negative for any acute process.  I ordered Valium, Toradol, Dilaudid, Zofran for patient's symptoms.  On reevaluation pain is improved.  I think patient symptoms are most likely related to muscle sprain in the absence of a finding on the CTA head and neck.  Work-up is overall reassuring,.  Discussed leukocytosis with attending, suspect most likely secondary to pain rather than underlying infectious etiology.    Plan-patient will follow-up with her PCP early next week.  Return precaution discussed, Toradol prescribed as well as Zofran.        Final Clinical Impression(s) / ED Diagnoses Final diagnoses:  Neck pain  Leukocytosis, unspecified type    Rx / DC Orders ED Discharge Orders          Ordered    methocarbamol (ROBAXIN) 500 MG tablet  2 times daily        05/28/22 2248    ketorolac (TORADOL) 10 MG tablet  Every 6 hours PRN        05/28/22 2313    ondansetron (ZOFRAN-ODT) 8 MG disintegrating tablet  Every 8 hours PRN        05/28/22 2313              Sherrill Raring, PA-C 05/28/22 2315    Fredia Sorrow, MD 05/29/22 [phone]

## 2022-05-29 ENCOUNTER — Other Ambulatory Visit: Payer: Self-pay | Admitting: Family Medicine

## 2022-05-31 ENCOUNTER — Encounter: Payer: Self-pay | Admitting: Family Medicine

## 2022-06-01 ENCOUNTER — Other Ambulatory Visit: Payer: Self-pay | Admitting: Family Medicine

## 2022-06-01 MED ORDER — CYCLOBENZAPRINE HCL 10 MG PO TABS: 10.0000 mg | ORAL_TABLET | Freq: Three times a day (TID) | ORAL | 0 refills | Status: DC | PRN

## 2022-06-02 ENCOUNTER — Inpatient Hospital Stay (HOSPITAL_BASED_OUTPATIENT_CLINIC_OR_DEPARTMENT_OTHER): Payer: BC Managed Care – PPO | Admitting: Hematology

## 2022-06-02 ENCOUNTER — Inpatient Hospital Stay: Payer: BC Managed Care – PPO | Attending: Hematology

## 2022-06-02 VITALS — BP 90/64 | HR 75 | Temp 98.2°F | Resp 18 | Ht 66.0 in | Wt 197.3 lb

## 2022-06-02 DIAGNOSIS — E279 Disorder of adrenal gland, unspecified: Secondary | ICD-10-CM | POA: Diagnosis present

## 2022-06-02 DIAGNOSIS — D72829 Elevated white blood cell count, unspecified: Secondary | ICD-10-CM

## 2022-06-02 DIAGNOSIS — F17210 Nicotine dependence, cigarettes, uncomplicated: Secondary | ICD-10-CM | POA: Diagnosis not present

## 2022-06-02 DIAGNOSIS — Z853 Personal history of malignant neoplasm of breast: Secondary | ICD-10-CM | POA: Diagnosis not present

## 2022-06-02 DIAGNOSIS — E278 Other specified disorders of adrenal gland: Secondary | ICD-10-CM

## 2022-06-02 LAB — CBC WITH DIFFERENTIAL/PLATELET
Abs Immature Granulocytes: 0.01 10*3/uL (ref 0.00–0.07)
Basophils Absolute: 0.1 10*3/uL (ref 0.0–0.1)
Basophils Relative: 1 %
Eosinophils Absolute: 0.2 10*3/uL (ref 0.0–0.5)
Eosinophils Relative: 2 %
HCT: 43 % (ref 36.0–46.0)
Hemoglobin: 14.5 g/dL (ref 12.0–15.0)
Immature Granulocytes: 0 %
Lymphocytes Relative: 41 %
Lymphs Abs: 4.5 10*3/uL — ABNORMAL HIGH (ref 0.7–4.0)
MCH: 33 pg (ref 26.0–34.0)
MCHC: 33.7 g/dL (ref 30.0–36.0)
MCV: 97.9 fL (ref 80.0–100.0)
Monocytes Absolute: 0.6 10*3/uL (ref 0.1–1.0)
Monocytes Relative: 6 %
Neutro Abs: 5.5 10*3/uL (ref 1.7–7.7)
Neutrophils Relative %: 50 %
Platelets: 388 10*3/uL (ref 150–400)
RBC: 4.39 MIL/uL (ref 3.87–5.11)
RDW: 13.3 % (ref 11.5–15.5)
WBC: 10.9 10*3/uL — ABNORMAL HIGH (ref 4.0–10.5)
nRBC: 0 % (ref 0.0–0.2)

## 2022-06-02 NOTE — Patient Instructions (Addendum)
[facility]  Discharge Instructions  You were seen and examined today by Dr. Delton Coombes.  Dr. Delton Coombes discussed your most recent lab work which revealed that your white blood cell count has came down to 10.9 from 19.1 earlier.   Follow-up as scheduled in 1 year with labs.    Thank you for choosing [facility] to provide your oncology and hematology care.   To afford each patient quality time with our provider, please arrive at least 15 minutes before your scheduled appointment time. You may need to reschedule your appointment if you arrive late (10 or more minutes). Arriving late affects you and other patients whose appointments are after yours.  Also, if you miss three or more appointments without notifying the office, you may be dismissed from the clinic at the provider's discretion.    Again, thank you for choosing [facility].  Our hope is that these requests will decrease the amount of time that you wait before being seen by our physicians.   If you have a lab appointment with the [facility] please come in thru the Main Entrance and check in at the main information desk.           _____________________________________________________________  Should you have questions after your visit to [facility], please contact our office at [phone] and follow the prompts.  Our office hours are 8:00 a.m. to 4:30 p.m. Monday - Thursday and 8:00 a.m. to 2:30 p.m. Friday.  Please note that voicemails left after 4:00 p.m. may not be returned until the following business day.  We are closed weekends and all major holidays.  You do have access to a nurse 24-7, just call the main number to the clinic [phone] and do not press any options, hold on the line and a nurse will answer the phone.    For prescription refill requests, have your pharmacy contact our office and allow 72 hours.

## 2022-06-02 NOTE — Progress Notes (Signed)
Annette [name], Annette [name] [id number]   CLINIC:  Medical Oncology/Hematology  PCP:  Fayrene Helper, MD [address], [place] / [place] [address] [phone]   REASON FOR VISIT:  Follow-up for leukocytosis.  PRIOR THERAPY: left adrenalectomy and splenectomy on 02/16/2012  NGS Results: not done  CURRENT THERAPY: surveillance  BRIEF ONCOLOGIC HISTORY:  Oncology History   No history exists.    CANCER STAGING:  Cancer Staging  No matching staging information was found for the patient.  INTERVAL HISTORY:  [name], a 61 y.o. female, seen for follow-up of leukocytosis and right adrenal nodules.  She was recently evaluated in the ER for right neck pain and had a CT angiogram which did not reveal any abnormalities.  She does not report any B symptoms or recurrent infections.  REVIEW OF SYSTEMS:  Review of Systems  Constitutional:  Negative for appetite change and fatigue.  HENT:   Positive for trouble swallowing.   Respiratory:  Negative for shortness of breath.   Cardiovascular:  Positive for palpitations.  Gastrointestinal:  Positive for constipation and nausea. Negative for diarrhea.  Musculoskeletal:  Positive for arthralgias (4/10 legs and hips).  Neurological:  Positive for headaches. Negative for dizziness.  Psychiatric/Behavioral:  Positive for depression.   All other systems reviewed and are negative.   PAST MEDICAL/SURGICAL HISTORY:  Past Medical History:  Diagnosis Date   Acute blood loss anemia 02/16/2012   Arthritis    Phreesia 10/14/2020   Blood transfusion without reported diagnosis    Phreesia 10/14/2020   Breast cancer ([place]) 2007   History of  XRT, onTamoxifen   Bruises easily    Cancer ([place])    Phreesia 10/14/2020   Depression    Depression    Phreesia 10/14/2020   Diabetes mellitus    prediabetic   Diabetes mellitus without complication ([place])    Phreesia 10/14/2020   GERD  (gastroesophageal reflux disease)    Hypertriglyceridemia    Hypothyroidism    following chemo amnd radiaition for breast cancer, needed replacement short term   Hypothyroidism (acquired)    replaced x 1 year   IBS (irritable bowel syndrome)    Menieres disease    Controlled with triamterene    Migraines    Obesity    Palpitations    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)    Sleep apnea 2010   Problems with CPAP   Past Surgical History:  Procedure Laterality Date   ABDOMINAL EXPOSURE N/A 12/03/2020   Procedure: ABDOMINAL EXPOSURE;  Surgeon: Rosetta Posner, MD;  Location: [facility];  Service: Vascular;  Laterality: N/A;   ABDOMINAL HYSTERECTOMY  1998   Benign, fibroids   ADRENALECTOMY  02/16/2012   [facility], splemic trauma, 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   ANTERIOR LUMBAR FUSION N/A 12/03/2020   Procedure: Lumbar Four-Five Lumbar Five Sacral One Anterior lumbar interbody fusion;  Surgeon: Ashok Pall, MD;  Location: [facility];  Service: Neurosurgery;  Laterality: N/A;   APPENDECTOMY  06/12/08   BIOPSY  07/02/2020   Procedure: BIOPSY;  Surgeon: Harvel Quale, MD;  [facility];  Service: Gastroenterology;;   BIOPSY  01/22/2021   Procedure: BIOPSY;  Surgeon: Daneil Dolin, MD;  [facility];  Service: Endoscopy;;   BIOPSY  02/17/2022   Procedure: BIOPSY;  Surgeon: Rogene Houston, MD;  [facility];  Service: Endoscopy;;   BREAST LUMPECTOMY Left 2008   BREAST RECONSTRUCTION  Left reconstructive   BREAST SURGERY  2007   Left lumpectomy   BREAST SURGERY     [place]amm[facility] - right side   CHOLECYSTECTOMY  1985   COLONOSCOPY WITH PROPOFOL N/A 07/02/2020   Procedure: COLONOSCOPY WITH PROPOFOL;  Surgeon: Harvel Quale, MD;  [facility];  Service: Gastroenterology;  Laterality: N/A;  1045   COLONOSCOPY WITH PROPOFOL N/A 04/01/2021   Procedure: COLONOSCOPY WITH PROPOFOL;  Surgeon: Harvel Quale, MD;  [facility];  Service:  Gastroenterology;  Laterality: N/A;  12:50   ESOPHAGEAL DILATION  12/19/2015   Procedure: ESOPHAGEAL DILATION;  Surgeon: Rogene Houston, MD;  [facility];  Service: Endoscopy;;   ESOPHAGOGASTRODUODENOSCOPY N/A 12/19/2015   Procedure: ESOPHAGOGASTRODUODENOSCOPY (EGD);  Surgeon: Rogene Houston, MD;  [facility];  Service: Endoscopy;  Laterality: N/A;  11:40   ESOPHAGOGASTRODUODENOSCOPY (EGD) WITH PROPOFOL N/A 02/17/2022   Procedure: ESOPHAGOGASTRODUODENOSCOPY (EGD) WITH PROPOFOL;  Surgeon: Rogene Houston, MD;  [facility];  Service: Endoscopy;  Laterality: N/A;  [place] N/A 01/22/2021   Procedure: FLEXIBLE SIGMOIDOSCOPY;  Surgeon: Daneil Dolin, MD;  [facility];  Service: Endoscopy;  Laterality: N/A;   HEMOSTASIS CLIP PLACEMENT  04/01/2021   Procedure: HEMOSTASIS CLIP PLACEMENT;  Surgeon: Harvel Quale, MD;  [facility];  Service: Gastroenterology;;  x2   POLYPECTOMY  07/02/2020   Procedure: POLYPECTOMY;  Surgeon: Montez Morita, Quillian Quince, MD;  [facility];  Service: Gastroenterology;;   POLYPECTOMY  04/01/2021   Procedure: POLYPECTOMY INTESTINAL;  Surgeon: Harvel Quale, MD;  [facility];  Service: Gastroenterology;;   SPLENECTOMY, TOTAL  2/42/3536   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   THYROIDECTOMY, PARTIAL  11/05/2010   Benign disease    SOCIAL HISTORY:  Social History   Socioeconomic History   Marital status: Married    Spouse name: Alroy Dust   Number of children: 2   Years of education: Not on file   Highest education level: Not on file  Occupational History   Not on file  Tobacco Use   Smoking status: Every Day    Packs/day: 0.50    Years: 40.00    Total pack years: 20.00    Types: Cigarettes   Smokeless tobacco: Never   Tobacco comments:    1/2 pack a day  Vaping Use   Vaping Use: Never used  Substance and Sexual Activity    Alcohol use: No    Alcohol/week: 0.0 standard drinks of alcohol    Comment: Encouraged to quit smoking. She has tried the nicotone gum and patches but did not help   Drug use: No   Sexual activity: Yes    Birth control/protection: Surgical  Other Topics Concern   Not on file  Social History Narrative   Not on file   Social Determinants of Health   Financial Resource Strain: Medium Risk (11/11/2020)   Overall Financial Resource Strain (CARDIA)    Difficulty of Paying Living Expenses: Somewhat hard  Food Insecurity: No Food Insecurity (11/11/2020)   Hunger Vital Sign    Worried About Running Out of Food in the Last Year: Never true    Ran Out of Food in the Last Year: Never true  Transportation Needs: No Transportation Needs (11/11/2020)   PRAPARE - Hydrologist (Medical): No    Lack of Transportation (Non-Medical): No  Physical Activity: Inactive (11/11/2020)  Exercise Vital Sign    Days of Exercise per Week: 0 days    Minutes of Exercise per Session: 0 min  Stress: Stress Concern Present (11/11/2020)   [place]    Feeling of Stress : To some extent  Social Connections: Moderately Integrated (11/11/2020)   Social Connection and Isolation Panel [NHANES]    Frequency of Communication with Friends and Family: Three times a week    Frequency of Social Gatherings with Friends and Family: Three times a week    Attends Religious Services: More than 4 times per year    Active Member of Clubs or Organizations: No    Attends Archivist Meetings: Never    Marital Status: Married  Human resources officer Violence: Not At Risk (11/11/2020)   Humiliation, Afraid, Rape, and Kick questionnaire    Fear of Current or Ex-Partner: No    Emotionally Abused: No    Physically Abused: No    Sexually Abused: No    FAMILY HISTORY:  Family History  Problem Relation Age of Onset   Diabetes Mother    Arrhythmia  Mother    Heart disease Father    Hyperlipidemia Father    Arrhythmia Father    Cancer Paternal Grandmother        Breast   Diabetes Sister    Diabetes Brother    Heart disease Brother     CURRENT MEDICATIONS:  Current Outpatient Medications  Medication Sig Dispense Refill   aspirin EC 81 MG tablet Take 1 tablet (81 mg total) by mouth in the morning. 30 tablet 12   BD PEN NEEDLE NANO 2ND GEN 32G X 4 MM MISC USE ONCE DAILY 100 each 0   cyclobenzaprine (FLEXERIL) 10 MG tablet Take 1 tablet (10 mg total) by mouth 3 (three) times daily as needed for muscle spasms. 30 tablet 0   Dulaglutide (TRULICITY) 1.5 ZO/1.0RU SOPN INJECT 1.5MG SUBCUTANEOUSLY ONCE A WEEK 6 mL 2   fluticasone (FLONASE) 50 MCG/ACT nasal spray Place 2 sprays into both nostrils daily. 16 g 6   glucose blood (ONETOUCH VERIO) test strip USE AS DIRECTED 2 TIMES DAILY 200 strip 3   glucose blood test strip 200 each by Other route See admin instructions. 100 each 1   insulin degludec (TRESIBA FLEXTOUCH) 200 UNIT/ML FlexTouch Pen Inject 26 Units into the skin at bedtime. 18 mL 1   Insulin Pen Needle (PEN NEEDLES) 31G X 6 MM MISC See admin instructions.     ketorolac (TORADOL) 10 MG tablet Take 1 tablet (10 mg total) by mouth every 6 (six) hours as needed. 20 tablet 0   Lancets (ONETOUCH DELICA PLUS [id number]) MISC TEST 4 TIMES A DAY 100 each 7   LORazepam (ATIVAN) 1 MG tablet Take one tablet by mouth once daily, as needed, for  uncontrolled nausea 30 tablet 1   LORazepam (ATIVAN) 1 MG tablet Take one tablet by mouht once daily, as needed, for nausea 30 tablet 1   methocarbamol (ROBAXIN) 500 MG tablet Take 1 tablet (500 mg total) by mouth 2 (two) times daily. 20 tablet 0   omeprazole (PRILOSEC) 40 MG capsule Take 1 capsule (40 mg total) by mouth daily. 7 capsule 0   ondansetron (ZOFRAN) 4 MG tablet Take 1 tablet (4 mg total) by mouth every 8 (eight) hours as needed for nausea or vomiting. 30 tablet 1   ondansetron (ZOFRAN-ODT) 8  MG disintegrating tablet Take 1 tablet (8 mg total) by mouth  every 8 (eight) hours as needed. 20 tablet 0   promethazine-dextromethorphan (PROMETHAZINE-DM) 6.25-15 MG/5ML syrup Take 1.3 mLs by mouth 4 (four) times daily as needed for cough. 118 mL 0   rosuvastatin (CRESTOR) 20 MG tablet Take 1 tablet (20 mg total) by mouth daily. 90 tablet 1   sulfamethoxazole-trimethoprim (BACTRIM DS) 800-160 MG tablet Take 1 tablet by mouth 2 (two) times daily. 20 tablet 0   temazepam (RESTORIL) 15 MG capsule Take 1 capsule (15 mg total) by mouth at bedtime as needed for sleep. 90 capsule 1   traZODone (DESYREL) 100 MG tablet TAKE 1 TABLET BY MOUTH EVERYDAY AT BEDTIME 90 tablet 1   triamterene-hydrochlorothiazide (DYAZIDE) 37.5-25 MG capsule Take 1 each (1 capsule total) by mouth every morning. 90 capsule 1   UNABLE TO FIND CPAP tubing and supplies, face mask Dx: G47.30 1 each 0   venlafaxine XR (EFFEXOR-XR) 75 MG 24 hr capsule TAKE 1 CAPSULE BY MOUTH EVERY DAY 90 capsule 0   No current facility-administered medications for this visit.    ALLERGIES:  Allergies  Allergen Reactions   Penicillins Swelling    Has patient had a PCN reaction causing immediate rash, facial/tongue/throat swelling, SOB or lightheadedness with hypotension: Yes Has patient had a PCN reaction causing severe rash involving mucus membranes or skin necrosis: No Has patient had a PCN reaction that required hospitalization: No  Has patient had a PCN reaction occurring within the last 10 years: No If all of the above answers are "NO", then may proceed with Cephalosporin use.   Of face and tongue.   Metformin And Related Diarrhea and Nausea Only    Pt to discontinue med due to intolerance, cramps and diarrhea   Wound Dressing Adhesive Itching and Rash    Dermabond and other surgical glues for incisions   Jardiance [Empagliflozin] Other (See Comments)    Yeast infection     Statins Other (See Comments)    Fell bad but on Crestor    Zetia [Ezetimibe] Other (See Comments)    Feels funny    PHYSICAL EXAM:  Performance status (ECOG): 1 - Symptomatic but completely ambulatory  Vitals:   06/02/22 1451  BP: 90/64  Pulse: 75  Resp: 18  Temp: 98.2 F (36.8 C)  SpO2: 97%   Wt Readings from Last 3 Encounters:  06/02/22 197 lb 4.8 oz (89.5 kg)  05/28/22 190 lb (86.2 kg)  02/17/22 196 lb 6.4 oz (89.1 kg)   Physical Exam Vitals reviewed.  Constitutional:      Appearance: Normal appearance. She is obese.  Cardiovascular:     Rate and Rhythm: Normal rate and regular rhythm.     Pulses: Normal pulses.     Heart sounds: Normal heart sounds.  Pulmonary:     Effort: Pulmonary effort is normal.     Breath sounds: Normal breath sounds.  Neurological:     General: No focal deficit present.     Mental Status: She is alert and oriented to person, place, and time.  Psychiatric:        Mood and Affect: Mood normal.        Behavior: Behavior normal.      LABORATORY DATA:  I have reviewed the labs as listed.     Latest Ref Rng & Units 06/02/2022    1:56 PM 05/28/2022    8:00 PM 11/04/2021    2:59 PM  CBC  WBC 4.0 - 10.5 K/uL 10.9  19.1  17.0   Hemoglobin 12.0 -  15.0 g/dL 14.5  16.7  15.9   Hematocrit 36.0 - 46.0 % 43.0  49.4  46.3   Platelets 150 - 400 K/uL 388  399  396       Latest Ref Rng & Units 05/28/2022    8:00 PM 01/26/2022    4:01 PM 11/02/2021    9:35 AM  CMP  Glucose 70 - 99 mg/dL 118  108    BUN 8 - 23 mg/dL 14  16    Creatinine 0.44 - 1.00 mg/dL 0.98  1.05    Sodium 135 - 145 mmol/L 139  144    Potassium 3.5 - 5.1 mmol/L 3.2  4.7  3.8   Chloride 98 - 111 mmol/L 101  102    CO2 22 - 32 mmol/L 28  25    Calcium 8.9 - 10.3 mg/dL 9.8  10.2    Total Protein 6.0 - 8.5 g/dL  7.1    Total Bilirubin 0.0 - 1.2 mg/dL  0.5    Alkaline Phos 44 - 121 IU/L  97    AST 0 - 40 IU/L  24    ALT 0 - 32 IU/L  20      DIAGNOSTIC IMAGING:  I have independently reviewed the scans and discussed with the patient. DG  Chest Portable 1 View  Result Date: 05/28/2022 CLINICAL DATA:  Cough EXAM: PORTABLE CHEST 1 VIEW COMPARISON:  11/01/2019 FINDINGS: The heart size and mediastinal contours are within normal limits. Both lungs are clear. The visualized skeletal structures are unremarkable. Surgical clips noted within the left axilla and left breast. IMPRESSION: No active disease. Electronically Signed   By: Fidela Salisbury M.D.   On: 05/28/2022 22:42   CT ANGIO HEAD NECK W WO CM  Result Date: 05/28/2022 CLINICAL DATA:  Sudden severe headache EXAM: CT ANGIOGRAPHY HEAD AND NECK TECHNIQUE: Multidetector CT imaging of the head and neck was performed using the standard protocol during bolus administration of intravenous contrast. Multiplanar CT image reconstructions and MIPs were obtained to evaluate the vascular anatomy. Carotid stenosis measurements (when applicable) are obtained utilizing NASCET criteria, using the distal internal carotid diameter as the denominator. RADIATION DOSE REDUCTION: This exam was performed according to the departmental dose-optimization program which includes automated exposure control, adjustment of the mA and/or kV according to patient size and/or use of iterative reconstruction technique. CONTRAST:  76m OMNIPAQUE IOHEXOL 350 MG/ML SOLN COMPARISON:  None Available. FINDINGS: CT HEAD FINDINGS Brain: There is no mass, hemorrhage or extra-axial collection. The size and configuration of the ventricles and extra-axial CSF spaces are normal. There is no acute or chronic infarction. The brain parenchyma is normal. Skull: The visualized skull base, calvarium and extracranial soft tissues are normal. Sinuses/Orbits: No fluid levels or advanced mucosal thickening of the visualized paranasal sinuses. No mastoid or middle ear effusion. The orbits are normal. CTA NECK FINDINGS SKELETON: There is no bony spinal canal stenosis. No lytic or blastic lesion. OTHER NECK: Normal pharynx, larynx and major salivary glands.  No cervical lymphadenopathy. Unremarkable thyroid gland. UPPER CHEST: No pneumothorax or pleural effusion. No nodules or masses. AORTIC ARCH: There is no calcific atherosclerosis of the aortic arch. There is no aneurysm, dissection or hemodynamically significant stenosis of the visualized portion of the aorta. Conventional 3 vessel aortic branching pattern. The visualized proximal subclavian arteries are widely patent. RIGHT CAROTID SYSTEM: Normal without aneurysm, dissection or stenosis. LEFT CAROTID SYSTEM: Normal without aneurysm, dissection or stenosis. VERTEBRAL ARTERIES: Left dominant configuration. Both origins are clearly  patent. There is no dissection, occlusion or flow-limiting stenosis to the skull base (V1-V3 segments). CTA HEAD FINDINGS POSTERIOR CIRCULATION: --Vertebral arteries: Normal V4 segments. --Inferior cerebellar arteries: Normal. --Basilar artery: Normal. --Superior cerebellar arteries: Normal. --Posterior cerebral arteries (PCA): Normal. ANTERIOR CIRCULATION: --Intracranial internal carotid arteries: Normal. --Anterior cerebral arteries (ACA): Normal. Both A1 segments are present. Patent anterior communicating artery (a-comm). --Middle cerebral arteries (MCA): Normal. VENOUS SINUSES: As permitted by contrast timing, patent. ANATOMIC VARIANTS: None Review of the MIP images confirms the above findings. IMPRESSION: Normal CTA of the head and neck. Electronically Signed   By: Ulyses Jarred M.D.   On: 05/28/2022 21:18     ASSESSMENT:  Right adrenal nodules: - CT chest on 04/17/2021 showed 2 right adrenal nodules, measuring 2.6 x 1.7 cm, 3.0 x 1.7 cm compatible with adrenal adenomas. 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 - CT abdomen in May 2013 showed 1.5 cm right adrenal nodule. - She reportedly lost about 15 pounds since March of this year when she was hospitalized with vomiting diarrhea and dehydration.     Social/family history: - Lives at home with her husband. 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 - She smokes half pack per day for 45 years. - Paternal grandmother had breast cancer.  3.  Stage I left breast cancer, ER positive, HER2 positive: - Diagnosed on 12/10/2005, status post lumpectomy. - Taxotere, Cytoxan plus Herceptin for 6 cycles followed by 1 year of Herceptin. - 5 years of tamoxifen completed November 2012.   PLAN:  Right adrenal nodules: -She had incidental right adrenal nodules found on CT scan of the chest. -Blood work and 24-hour urinary metanephrines, normetanephrine's and cortisol levels were within normal limits. -Continue follow-ups with Dr. Renne Crigler.  2.  JAK2 and BCR/ABL negative leukocytosis: -Thought to be from combination of smoking and splenectomy. - White count is better at 10.9 with elevated absolute lymphocyte count.  Previous testing for JAK2 V6 11F, BCR/ABL was negative.  Flow cytometry on 03/12/2020 was negative for monoclonal B-cell and T-cell processes.  She is continuing to smoke half pack of cigarettes per day.  She does not have any B symptoms or infections.  No further work-up needed.  We will see her back in 1 year for follow-up with repeat labs.  3.  Left breast cancer: -Mammogram on 11/09/2021 was BI-RADS Category 1.  Continue yearly mammograms.  4.  Smoking history: -Continue yearly screening CT scans.   Orders placed this encounter:  Orders Placed This Encounter  Procedures   CBC with Differential/Platelet   Lactate dehydrogenase      Derek Jack, MD [place] [phone]

## 2022-06-04 ENCOUNTER — Other Ambulatory Visit: Payer: Self-pay | Admitting: Family Medicine

## 2022-06-04 MED ORDER — KETOROLAC TROMETHAMINE 10 MG PO TABS
10.0000 mg | ORAL_TABLET | Freq: Three times a day (TID) | ORAL | 0 refills | Status: DC | PRN
Start: 2022-06-04 — End: 2022-12-14

## 2022-06-04 NOTE — Progress Notes (Signed)
Toradol 10

## 2022-06-14 ENCOUNTER — Ambulatory Visit: Payer: BC Managed Care – PPO | Admitting: Internal Medicine

## 2022-06-14 ENCOUNTER — Encounter: Payer: Self-pay | Admitting: Internal Medicine

## 2022-06-14 VITALS — BP 118/76 | HR 86 | Ht 66.0 in | Wt 193.0 lb

## 2022-06-14 DIAGNOSIS — E1165 Type 2 diabetes mellitus with hyperglycemia: Secondary | ICD-10-CM

## 2022-06-14 DIAGNOSIS — E782 Mixed hyperlipidemia: Secondary | ICD-10-CM

## 2022-06-14 DIAGNOSIS — E278 Other specified disorders of adrenal gland: Secondary | ICD-10-CM | POA: Diagnosis not present

## 2022-06-14 DIAGNOSIS — Z794 Long term (current) use of insulin: Secondary | ICD-10-CM

## 2022-06-14 LAB — POCT GLYCOSYLATED HEMOGLOBIN (HGB A1C): Hemoglobin A1C: 7.1 % — AB (ref 4.0–5.6)

## 2022-06-14 MED ORDER — TRESIBA FLEXTOUCH 200 UNIT/ML ~~LOC~~ SOPN: 26.0000 [IU] | PEN_INJECTOR | Freq: Every day | SUBCUTANEOUS | 3 refills | Status: DC

## 2022-06-14 MED ORDER — FREESTYLE LIBRE 3 SENSOR MISC: 1.0000 | 3 refills | Status: DC

## 2022-06-14 NOTE — Progress Notes (Signed)
Patient ID: Annette [name], female   DOB: January 25, 1961, 61 y.o.   MRN: [id number]   HPI: Annette [name] is a 61 y.o.-year-old female, initially referred by her PCP, Dr. Moshe Cipro, returning for follow-up for DM2, dx in 2018, insulin-dependent since 03/2019, uncontrolled, without long-term complications.  She previously saw Dr. Dorris Fetch and Dr. Chalmers Cater but then switched to seeing me.  Last visit 4 months ago.  He is here with her husband who offers part of the history.  Interim history: She continues to have some nausea. Otherwise, she has no complaints.  Reviewed HbA1c levels: Lab Results  Component Value Date   HGBA1C 6.9 (A) 02/08/2022   HGBA1C 6.7 (H) 01/26/2022   HGBA1C 5.9 (A) 05/19/2021   HGBA1C 6.0 (H) 01/17/2021   HGBA1C 6.4 (H) 12/03/2020   HGBA1C 6.6 (A) 09/29/2020   HGBA1C 7.0 (A) 06/24/2020   HGBA1C 9.4 (A) 03/11/2020   HGBA1C 7.5 (H) 07/25/2019   HGBA1C 8.3 (H) 03/12/2019   Previously on: - Glipizide XL 20 mg before breakfast - Lantus 50 units at bedtime (dose increased 04/08/2020) She could not tolerate Metformin due to GI intolerance - abdominal pain and diarrhea. She had vaginal yeast infections and UTIs from [place]. Previously on Tresiba, but unclear why changed to Lantus.  Currently on: - Trulicity 1.60 >> 1.5 mg weekly - started 04/2020 -tolerated well - Tresiba 50 units at bedtime  >> Toujeo 50 >> 56 units at bedtime >> Tresiba 50 >> 30-46 >> 24 >> 24-30 >> 24-26 units daily We stopped glipizide XL 01/2021.  Pt checks her sugars twice a day: - am: 43, 55-99, 162 >> 80-112 >> 77, 94, 102-135 >> 93-134, 144 - 2h after b'fast: n/c - before lunch: n/c >> 120 >> n/c - 2h after lunch: n/c >> 64 >> n/c - before dinner: 67, 77-127, 148 >> 62, 65, 70-176, 199 >> 84-139 - 2h after dinner: 133, 143, 177 >> 106-166 >> 125-150 >> n/c - bedtime: n/c >> 94 >> n/c - nighttime: n/c Lowest sugar was 51 >> 58 >> 43 (am) >> 67 >> 62 >> 68; she has hypoglycemia awareness in the  90s.  Highest sugar was 425 ...>> 269 (steroid inj) >> 150 >> 199 >> 176.  Glucometer: One Touch verio  She is again only eating 1 meal a day per husband's report She only drinks unsweetened tea and coffee.  -She has no history of CKD, last BUN/creatinine:  Lab Results  Component Value Date   BUN 14 05/28/2022   BUN 16 01/26/2022   CREATININE 0.98 05/28/2022   CREATININE 1.05 (H) 01/26/2022  Not on ACE inhibitor/ARB.  + HL; last set of lipids: Lab Results  Component Value Date   CHOL 142 01/26/2022   HDL 56 01/26/2022   LDLCALC 66 01/26/2022   TRIG 108 01/26/2022   CHOLHDL 2.5 01/26/2022  On Crestor 20. She was also on Zetia but she did not feel well on it >> stopped.  - last eye exam was in fall 2020: No DR reportedly.  She sometimes has double vision. I previously recommended to see Dr. Prudencio Burly, but she did not see them yet.  -no numbness but some tingling in her right foot -after her back surgery  Pt has FH of DM in mother, brother, sister.  She also has a history of adrenal adenomas: - she had previous adrenalectomy by Dr. 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   Received labeled "left adrenal gland" is a 30 g adrenal gland  which is received transected by the surgeon which, when  reapproximated has an overall measurement of 8.5 x 4.5 x 2.8 cm.  The cut surface reveals a central well-circumscribed, yellow  cortical nodule measuring 4 x 3.5 x 2.8 cm. The nodule is focally  compressing the adrenal gland. Within the surrounding soft tissue  there is a 0.3 cm yellow nodule which has a similar appearance to  the cortical nodule representative sections are submitted as  follows:   - before last visit, she was found to have 2 adrenal adenomas, which is investigated by oncology.  They had negative Hounsfield units on CT, consistent with benign  nodules.  Hormonal investigation was negative: Component     Latest Ref Rng 09/07/2021 09/09/2021 11/02/2021  Epinephrine, Rand Ur     Undefined ug/L  1    Epinephrine, U, 24Hr     0 - 20 ug/24 hr  2    Norepinephrine, Rand Ur     Undefined ug/L  19    Norepinephrine,U,24H     0 - 135 ug/24 hr  29    Dopamine, Rand Ur     Undefined ug/L  104    Dopamine, Ur, 24Hr     0 - 510 ug/24 hr  156    Total Volume  1,500    Total Volume  1,500    Normetanephrine, Ur     Undefined ug/L  142    Normetanephr.,U,24h     131 - 612 ug/24 hr  213    Metanephrine, Ur     Undefined ug/L  26    Metanephrines, 24H Ur     36 - 209 ug/24 hr  39    Norepinephrine     0 - 874 pg/mL 745     Epinephrine     0 - 62 pg/mL 19     Dopamine     0 - 48 pg/mL 40     ALDOSTERONE      ng/dL   27   Renin Activity     0.25 - 5.82 ng/mL/h   13.41 (H)   ALDO / PRA Ratio     0.9 - 28.9 Ratio   2.0   Normetanephrine, Pl     0.0 - 285.2 pg/mL 106.9     Metanephrine, Pl     0.0 - 88.0 pg/mL 10.9     C206 ACTH     6 - 50 pg/mL 4.2 (L)   23   Cortisol, Plasma     ug/dL 6.6   44.3   Cortisol, Plasma        37.2   Cortisol, Plasma        24.4   Potassium     3.5 - 5.1 mEq/L   3.8    Dexamethasone suppression test was normal: Component     Latest Ref Rng 12/03/2021  Dexamethasone, Serum     ng/dL 432   Cortisol - AM     mcg/dL 2.6 (L)     Previous investigation was also negative: Component     Latest Ref Rng 08/17/2011  Total Volume - CF 24Hr U     mL 1300   Epinephrine, 24 hr Urine     2 - 24 mcg/24 h 4   Norepinephrine, 24 hr Ur     15 - 100  mcg/24 h 39   Calculated Total (E+NE)     26 - 121 mcg/24 h 43   Dopamine, 24 hr Urine     52 - 480 mcg/24 h 139   Creatinine, Urine mg/day-CATEUR     0.63 - 2.50 g/24 h 1.34   Normetanephrine, Ur     Undefined ug/L   Normetanephr.,U,24h     131 - 612 ug/24 hr 318   Metanephrine, Ur     Undefined ug/L 422   Metanephrines, 24H Ur     36 - 209 ug/24  hr   Metanephrines, Ur     90 - 315 mcg/24 h 104    Component     Latest Ref Rng 08/17/2011  Cortisol, Plasma     ug/dL 14.6   Aldosterone, Serum      ng/dL 23    08/17/2011: Component Ref Range & Units 10 yr ago  Total Volume - CF 24Hr U mL 1300   Epinephrine, 24 hr Urine 2 - 24 mcg/24 h 4   Norepinephrine, 24 hr Ur 15 - 100 mcg/24 h 39   Calculated Total (E+NE) 26 - 121 mcg/24 h 43   Dopamine, 24 hr Urine 52 - 480 mcg/24 h 139   Creatinine, Urine mg/day-CATEUR 0.63 - 2.50 g/24 h 1.34   Resulting Agency  [facility]   Component Ref Range & Units 10 yr ago  Metanephrines, Ur 90 - 315 mcg/24 h 104   Comment: This specimen was submitted with a pH greater than 6.0. Optimum pH for this assay is 1.0-6.0. Improper preservation may compromise the validity of the assay.  Normetanephrine, 24H Ur 122 - 676 mcg/24 h 318   Metaneph Total, Ur 224 - 832 mcg/24 h 422   Comment: A four fold elevation of urinary normetanephrines is extremely likely to be due to a tumor, while a four fold elevation of urinary metanephrines is highly suggestive, but not diagnostic of the tumor. Measurement of plasma Metanephrines and Chromogranin A is recommended for confirmation.  Resulting Agency  [facility]   12/22/2009: Cortisol,F,ug/L,U Undefined ug/L 20   Cortisol, 24H Ur 0-50 ug/24 hr 50    She also has a history of HTN, OSA. She had COVID-19 10/2019. She recovered well afterwards.  She also has a history of partial thyroidectomy. Reviewed latest TSH: Lab Results  Component Value Date   TSH 1.830 01/26/2022   She had back surgery (12/2020).  She developed significant leg pain afterwards which slowly improved.  ROS: + See HPI  I reviewed pt's medications, allergies, PMH, social hx, family hx, and changes were documented in the history of present illness. Otherwise, unchanged from my initial visit note.  Past Medical History:  Diagnosis Date   Acute blood loss anemia 02/16/2012   Arthritis     Phreesia 10/14/2020   Blood transfusion without reported diagnosis    Phreesia 10/14/2020   Breast cancer ([place]) 2007   History of  XRT, onTamoxifen   Bruises easily    Cancer ([place])    Phreesia 10/14/2020   Depression    Depression    Phreesia 10/14/2020   Diabetes mellitus    prediabetic   Diabetes mellitus without complication ([place])    Phreesia 10/14/2020   GERD (gastroesophageal reflux disease)    Hypertriglyceridemia    Hypothyroidism    following chemo amnd radiaition for breast cancer, needed replacement short term   Hypothyroidism (acquired)    replaced x 1 year   IBS (irritable bowel syndrome)  Menieres disease    Controlled with triamterene    Migraines    Obesity    Palpitations    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HCC)    Sleep apnea 2010   Problems with CPAP   Past Surgical History:  Procedure Laterality Date   ABDOMINAL EXPOSURE N/A 12/03/2020   Procedure: ABDOMINAL EXPOSURE;  Surgeon: Rosetta Posner, MD;  Location: [facility];  Service: Vascular;  Laterality: N/A;   ABDOMINAL HYSTERECTOMY  1998   Benign, fibroids   ADRENALECTOMY  02/16/2012   [facility], splemic trauma, 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   ANTERIOR LUMBAR FUSION N/A 12/03/2020   Procedure: Lumbar Four-Five Lumbar Five Sacral One Anterior lumbar interbody fusion;  Surgeon: Ashok Pall, MD;  Location: [facility];  Service: Neurosurgery;  Laterality: N/A;   APPENDECTOMY  06/12/08   BIOPSY  07/02/2020   Procedure: BIOPSY;  Surgeon: Harvel Quale, MD;  [facility];  Service: Gastroenterology;;   BIOPSY  01/22/2021   Procedure: BIOPSY;  Surgeon: Daneil Dolin, MD;  [facility];  Service: Endoscopy;;   BIOPSY  02/17/2022   Procedure: BIOPSY;  Surgeon: Rogene Houston, MD;  [facility];  Service: Endoscopy;;   BREAST LUMPECTOMY Left 2008   BREAST RECONSTRUCTION     Left reconstructive   BREAST SURGERY  2007   Left lumpectomy   BREAST SURGERY     [place] - right side    CHOLECYSTECTOMY  1985   COLONOSCOPY WITH PROPOFOL N/A 07/02/2020   Procedure: COLONOSCOPY WITH PROPOFOL;  Surgeon: Harvel Quale, MD;  [facility];  Service: Gastroenterology;  Laterality: N/A;  1045   COLONOSCOPY WITH PROPOFOL N/A 04/01/2021   Procedure: COLONOSCOPY WITH PROPOFOL;  Surgeon: Harvel Quale, MD;  [facility];  Service: Gastroenterology;  Laterality: N/A;  12:50   ESOPHAGEAL DILATION  12/19/2015   Procedure: ESOPHAGEAL DILATION;  Surgeon: Rogene Houston, MD;  [facility];  Service: Endoscopy;;   ESOPHAGOGASTRODUODENOSCOPY N/A 12/19/2015   Procedure: ESOPHAGOGASTRODUODENOSCOPY (EGD);  Surgeon: Rogene Houston, MD;  [facility];  Service: Endoscopy;  Laterality: N/A;  11:40   ESOPHAGOGASTRODUODENOSCOPY (EGD) WITH PROPOFOL N/A 02/17/2022   Procedure: ESOPHAGOGASTRODUODENOSCOPY (EGD) WITH PROPOFOL;  Surgeon: Rogene Houston, MD;  [facility];  Service: Endoscopy;  Laterality: N/A;  [place] N/A 01/22/2021   Procedure: FLEXIBLE SIGMOIDOSCOPY;  Surgeon: Daneil Dolin, MD;  [facility];  Service: Endoscopy;  Laterality: N/A;   HEMOSTASIS CLIP PLACEMENT  04/01/2021   Procedure: HEMOSTASIS CLIP PLACEMENT;  Surgeon: Harvel Quale, MD;  [facility];  Service: Gastroenterology;;  x2   POLYPECTOMY  07/02/2020   Procedure: POLYPECTOMY;  Surgeon: Montez Morita, Quillian Quince, MD;  [facility];  Service: Gastroenterology;;   POLYPECTOMY  04/01/2021   Procedure: POLYPECTOMY INTESTINAL;  Surgeon: Harvel Quale, MD;  [facility];  Service: Gastroenterology;;   SPLENECTOMY, TOTAL  2/58/5277   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   THYROIDECTOMY, PARTIAL  11/05/2010   Benign disease   Social History   Socioeconomic History   Marital status: Married    Spouse name: Alroy Dust   Number of children: 2   Years of  education: Not on file   Highest education level: Not on file  Occupational History   Not on file  Tobacco Use   Smoking status: Every Day    Packs/day: 0.50    Years: 40.00  Total pack years: 20.00    Types: Cigarettes   Smokeless tobacco: Never   Tobacco comments:    1/2 pack a day  Vaping Use   Vaping Use: Never used  Substance and Sexual Activity   Alcohol use: No    Alcohol/week: 0.0 standard drinks of alcohol    Comment: Encouraged to quit smoking. She has tried the nicotone gum and patches but did not help   Drug use: No   Sexual activity: Yes    Birth control/protection: Surgical  Other Topics Concern   Not on file  Social History Narrative   Not on file   Social Determinants of Health   Financial Resource Strain: Medium Risk (11/11/2020)   Overall Financial Resource Strain (CARDIA)    Difficulty of Paying Living Expenses: Somewhat hard  Food Insecurity: No Food Insecurity (11/11/2020)   Hunger Vital Sign    Worried About Running Out of Food in the Last Year: Never true    Ran Out of Food in the Last Year: Never true  Transportation Needs: No Transportation Needs (11/11/2020)   PRAPARE - Hydrologist (Medical): No    Lack of Transportation (Non-Medical): No  Physical Activity: Inactive (11/11/2020)   Exercise Vital Sign    Days of Exercise per Week: 0 days    Minutes of Exercise per Session: 0 min  Stress: Stress Concern Present (11/11/2020)   [place]    Feeling of Stress : To some extent  Social Connections: Moderately Integrated (11/11/2020)   Social Connection and Isolation Panel [NHANES]    Frequency of Communication with Friends and Family: Three times a week    Frequency of Social Gatherings with Friends and Family: Three times a week    Attends Religious Services: More than 4 times per year    Active Member of Clubs or Organizations: No    Attends Theatre manager Meetings: Never    Marital Status: Married  Human resources officer Violence: Not At Risk (11/11/2020)   Humiliation, Afraid, Rape, and Kick questionnaire    Fear of Current or Ex-Partner: No    Emotionally Abused: No    Physically Abused: No    Sexually Abused: No   Current Outpatient Medications on File Prior to Visit  Medication Sig Dispense Refill   aspirin EC 81 MG tablet Take 1 tablet (81 mg total) by mouth in the morning. 30 tablet 12   BD PEN NEEDLE NANO 2ND GEN 32G X 4 MM MISC USE ONCE DAILY 100 each 0   cyclobenzaprine (FLEXERIL) 10 MG tablet Take 1 tablet (10 mg total) by mouth 3 (three) times daily as needed for muscle spasms. 30 tablet 0   Dulaglutide (TRULICITY) 1.5 GL/8.7FI SOPN INJECT 1.'5MG'$  SUBCUTANEOUSLY ONCE A WEEK 6 mL 2   fluticasone (FLONASE) 50 MCG/ACT nasal spray Place 2 sprays into both nostrils daily. 16 g 6   glucose blood (ONETOUCH VERIO) test strip USE AS DIRECTED 2 TIMES DAILY 200 strip 3   glucose blood test strip 200 each by Other route See admin instructions. 100 each 1   insulin degludec (TRESIBA FLEXTOUCH) 200 UNIT/ML FlexTouch Pen Inject 26 Units into the skin at bedtime. 18 mL 1   Insulin Pen Needle (PEN NEEDLES) 31G X 6 MM MISC See admin instructions.     ketorolac (TORADOL) 10 MG tablet Take 1 tablet (10 mg total) by mouth every 8 (eight) hours as needed. 20 tablet 0  Lancets (ONETOUCH DELICA PLUS [id number]) MISC TEST 4 TIMES A DAY 100 each 7   LORazepam (ATIVAN) 1 MG tablet Take one tablet by mouth once daily, as needed, for  uncontrolled nausea 30 tablet 1   LORazepam (ATIVAN) 1 MG tablet Take one tablet by mouht once daily, as needed, for nausea 30 tablet 1   methocarbamol (ROBAXIN) 500 MG tablet Take 1 tablet (500 mg total) by mouth 2 (two) times daily. 20 tablet 0   omeprazole (PRILOSEC) 40 MG capsule Take 1 capsule (40 mg total) by mouth daily. 7 capsule 0   ondansetron (ZOFRAN) 4 MG tablet Take 1 tablet (4 mg total) by mouth every 8 (eight)  hours as needed for nausea or vomiting. 30 tablet 1   ondansetron (ZOFRAN-ODT) 8 MG disintegrating tablet Take 1 tablet (8 mg total) by mouth every 8 (eight) hours as needed. 20 tablet 0   promethazine-dextromethorphan (PROMETHAZINE-DM) 6.25-15 MG/5ML syrup Take 1.3 mLs by mouth 4 (four) times daily as needed for cough. 118 mL 0   rosuvastatin (CRESTOR) 20 MG tablet Take 1 tablet (20 mg total) by mouth daily. 90 tablet 1   sulfamethoxazole-trimethoprim (BACTRIM DS) 800-160 MG tablet Take 1 tablet by mouth 2 (two) times daily. 20 tablet 0   temazepam (RESTORIL) 15 MG capsule Take 1 capsule (15 mg total) by mouth at bedtime as needed for sleep. 90 capsule 1   traZODone (DESYREL) 100 MG tablet TAKE 1 TABLET BY MOUTH EVERYDAY AT BEDTIME 90 tablet 1   triamterene-hydrochlorothiazide (DYAZIDE) 37.5-25 MG capsule Take 1 each (1 capsule total) by mouth every morning. 90 capsule 1   UNABLE TO FIND CPAP tubing and supplies, face mask Dx: G47.30 1 each 0   venlafaxine XR (EFFEXOR-XR) 75 MG 24 hr capsule TAKE 1 CAPSULE BY MOUTH EVERY DAY 90 capsule 0   No current facility-administered medications on file prior to visit.   Allergies  Allergen Reactions   Penicillins Swelling    Has patient had a PCN reaction causing immediate rash, facial/tongue/throat swelling, SOB or lightheadedness with hypotension: Yes Has patient had a PCN reaction causing severe rash involving mucus membranes or skin necrosis: No Has patient had a PCN reaction that required hospitalization: No  Has patient had a PCN reaction occurring within the last 10 years: No If all of the above answers are "NO", then may proceed with Cephalosporin use.   Of face and tongue.   Metformin And Related Diarrhea and Nausea Only    Pt to discontinue med due to intolerance, cramps and diarrhea   Wound Dressing Adhesive Itching and Rash    Dermabond and other surgical glues for incisions   Jardiance [Empagliflozin] Other (See Comments)    Yeast  infection     Statins Other (See Comments)    Fell bad but on Crestor   Zetia [Ezetimibe] Other (See Comments)    Feels funny   Family History  Problem Relation Age of Onset   Diabetes Mother    Arrhythmia Mother    Heart disease Father    Hyperlipidemia Father    Arrhythmia Father    Cancer Paternal Grandmother        Breast   Diabetes Sister    Diabetes Brother    Heart disease Brother    PE: BP 118/76 (BP Location: Right Arm, Patient Position: Sitting, Cuff Size: Normal)   Pulse 86   Ht '5\' 6"'$  (1.676 m)   Wt 193 lb (87.5 kg)   SpO2 96%   BMI  31.15 kg/m  Wt Readings from Last 3 Encounters:  06/14/22 193 lb (87.5 kg)  06/02/22 197 lb 4.8 oz (89.5 kg)  05/28/22 190 lb (86.2 kg)   Constitutional: overweight, in NAD Eyes: EOMI, no exophthalmos ENT: moist mucous membranes, no thyromegaly, no cervical lymphadenopathy Cardiovascular: RRR, No MRG Respiratory: CTA B Musculoskeletal: no deformities Skin: moist, warm, no rashes Neurological: no tremor with outstretched hands Diabetic Foot Exam - Simple   Simple Foot Form Diabetic Foot exam was performed with the following findings: Yes 06/14/2022  3:05 PM  Visual Inspection No deformities, no ulcerations, no other skin breakdown bilaterally: Yes Sensation Testing Intact to touch and monofilament testing bilaterally: Yes Pulse Check Posterior Tibialis and Dorsalis pulse intact bilaterally: Yes Comments    ASSESSMENT: 1. DM2, insulin-dependent, uncontrolled, without long-term complications, but with hyperglycemia  No family history of medullary thyroid cancer or personal history of pancreatitis.  2. HL  3.  Obesity class II  PLAN:  1. Patient with longstanding, uncontrolled, type 2 diabetes, on injectable antidiabetic regimen, with long-acting insulin and weekly GLP-1 receptor agonist, with improved blood sugars in the last 2 years, but the higher HbA1c at last visit, at 6.9%.  At that time, the majority of her  blood sugars were at goal but she was only taking before breakfast and before dinner.  I advised her to rotate the checks throughout the day.  I also advised her to write down possible reasons for the abnormal blood sugars.  Sugars in the morning appeared to be higher than before, possibly related to her nausea.  At that time she was planning to have an EGD for further investigation and we discussed that we may need to stop Trulicity per GI recommendation.  She did have constipation and diarrhea but she did not absolutely feel that these were related to Trulicity.  We did not change her regimen at that time. -At today's visit, sugars are mostly at goal.  She is still checking in the morning and before dinner but not at other times of the day.  I did advise her to check some sugars at bedtime.  For now, however, we can definitely continue the current regimen.  I refilled her [place]. -She is interested in a CGM.  We discussed that if this is covered by her insurance, this would be a great tool for her.  I sent a prescription to her pharmacy. - I suggested to:  Patient Instructions  Please continue: - Tresiba 26 units daily - Trulicity 1.5 mg weekly  Write comments in your log about the abnormal blood sugars.  Please call and schedule an eye appt with: [facility]:   Address: [address], [place], [place] [address]  [phone]  Please return in 4 months with your sugar log.   - we checked her HbA1c: 7.1% (higher) -because this appears to be higher than expected from her log, we will also check a fructosamine level - advised to check sugars at different times of the day - 4x a day, rotating check times - advised for yearly eye exams >> she is not UTD - return to clinic in 4 months  2. HL -Reviewed latest lipid panel from 01/2022: Fractions at goal: Lab Results  Component Value Date   CHOL 142 01/26/2022   HDL 56 01/26/2022   LDLCALC 66 01/26/2022   TRIG 108  01/26/2022   CHOLHDL 2.5 01/26/2022  -She continues on Crestor 20 mg daily without side effects  3.  Adrenal adenoma -Labs were normal so far -Reviewed previous adrenal pathology from her surgery with Dr. Celine Ahr: Benign adenomas.  This is likely a bilateral process for her. -At the beginning of the year, we checked aldosterone, cortisol, plasma metanephrines and catecholamines and they were normal.  The dexamethasone suppression test was also at goal. -Adrenal hormone testing from 2011-2013 was reviewed and this is also normal -We will continue to keep an eye on this but no intervention is needed for now  Component     Latest Ref Rng 06/14/2022  Fructosamine     205 - 285 umol/L 274   The HbA1c calculated from fructosamine normal, at 6.27%, correlating better with blood sugar checks at home.  Philemon Kingdom, MD PhD [facility]

## 2022-06-14 NOTE — Patient Instructions (Signed)
Please continue: - Tresiba 24-26 units daily - Trulicity 1.5 mg weekly  Write comments in your log about the abnormal blood sugars.  Please call and schedule an eye appt with: [facility]:   Address: [address], [place], [place] [address]  [phone]  Please return in 4 months with your sugar log.

## 2022-06-15 ENCOUNTER — Ambulatory Visit: Payer: BC Managed Care – PPO | Admitting: Family Medicine

## 2022-06-15 ENCOUNTER — Encounter: Payer: Self-pay | Admitting: Family Medicine

## 2022-06-15 VITALS — BP 84/48 | HR 105 | Ht 66.0 in | Wt 193.0 lb

## 2022-06-15 DIAGNOSIS — F17218 Nicotine dependence, cigarettes, with other nicotine-induced disorders: Secondary | ICD-10-CM | POA: Diagnosis not present

## 2022-06-15 DIAGNOSIS — Z794 Long term (current) use of insulin: Secondary | ICD-10-CM

## 2022-06-15 DIAGNOSIS — M62838 Other muscle spasm: Secondary | ICD-10-CM

## 2022-06-15 DIAGNOSIS — Z23 Encounter for immunization: Secondary | ICD-10-CM

## 2022-06-15 DIAGNOSIS — R112 Nausea with vomiting, unspecified: Secondary | ICD-10-CM

## 2022-06-15 DIAGNOSIS — E1169 Type 2 diabetes mellitus with other specified complication: Secondary | ICD-10-CM

## 2022-06-15 MED ORDER — METHOCARBAMOL 750 MG PO TABS: 750.0000 mg | ORAL_TABLET | Freq: Three times a day (TID) | ORAL | 0 refills | Status: DC | PRN

## 2022-06-15 NOTE — Progress Notes (Signed)
Annette [name]     MRN: [id number]      DOB: September 18, 1961   HPI Annette [name] is here for follow up of recent EDvisit when she presented with disabling neck spasm Imaging of her head and neck showed no significant   C spine pathology States 50 % better , except still has some muscle spasm   ROS Denies recent fever or chills. Denies sinus pressure, nasal congestion, ear pain or sore throat. Denies chest congestion, productive cough or wheezing. Denies chest pains, palpitations and leg swelling C/o nausea when she has coffee in the morning, also drinks ea, has GERD and also smokes  Denies dysuria, frequency, hesitancy or incontinence.  Denies headaches, seizures, numbness, or tingling. Denies depression, anxiety or insomnia. Denies skin break down or rash.   PE  BP (!) 84/48 (BP Location: Right Arm, Patient Position: Sitting, Cuff Size: Large)   Pulse (!) 105   Ht '5\' 6"'$  (1.676 m)   Wt 193 lb 0.6 oz (87.6 kg)   SpO2 95%   BMI 31.16 kg/m   Patient alert and oriented and in no cardiopulmonary distress.  HEENT: No facial asymmetry, EOMI,     Neck decreased ROM with spasm .  Chest: Clear to auscultation bilaterally.  CVS: S1, S2 no murmurs, no S3.Regular rate.  .   Ext: No edema  MS: Adequate ROM spine, shoulders, hips and knees.  Skin: Intact, no ulcerations or rash noted.  Psych: Good eye contact, normal affect. Memory intact not anxious or depressed appearing.  CNS: CN 2-12 intact, power,  normal throughout.no focal deficits noted.   Assessment & Plan  Neck muscle spasm Improving, wil rx robaxin instead of flexeril as more effective  Nicotine dependence Asked:confirms currently smokes cigarettes Assess: Unwilling to set a quit date, but is cutting back Advise: needs to QUIT to reduce risk of cancer, cardio and cerebrovascular disease Assist: counseled for 5 minutes and literature provided Arrange: follow up in 2 to 4 months   Nausea with vomiting Needs  to stop caffeing, will improve GERD , no documentation of hiatal hernia on record review   Type 2 diabetes mellitus with other specified complication ([place]) Annette [name] is reminded of the importance of commitment to daily physical activity for 30 minutes or more, as able and the need to limit carbohydrate intake to 30 to 60 grams per meal to help with blood sugar control.   The need to take medication as prescribed, test blood sugar as directed, and to call between visits if there is a concern that blood sugar is uncontrolled is also discussed.   Annette [name] is reminded of the importance of daily foot exam, annual eye examination, and good blood sugar, blood pressure and cholesterol control.     Latest Ref Rng & Units 06/14/2022    3:10 PM 05/28/2022    8:00 PM 02/08/2022    2:53 PM 01/26/2022    4:01 PM 09/02/2021    4:02 PM  Diabetic Labs  HbA1c 4.0 - 5.6 % 7.1   6.9  6.7    Chol 100 - 199 mg/dL    142    HDL >39 mg/dL    56    Calc LDL 0 - 99 mg/dL    66    Triglycerides 0 - 149 mg/dL    108    Creatinine 0.44 - 1.00 mg/dL  0.98   1.05  0.96       06/15/2022    3:22 PM 06/14/2022  2:37 PM 06/02/2022    2:51 PM 05/28/2022   11:22 PM 05/28/2022    9:40 PM 05/28/2022    5:11 PM 02/17/2022   11:01 AM  BP/Weight  Systolic BP 84 472 90 072 182 883 95  Diastolic BP 48 76 64 61 61 64 64  Wt. (Lbs) 193.04 193 197.3   190   BMI 31.16 kg/m2 31.15 kg/m2 31.85 kg/m2   30.67 kg/m2       06/14/2022    3:00 PM 02/08/2022    3:00 PM  Foot/eye exam completion dates  Foot Form Completion Done Done      Improved managed by Endo

## 2022-06-15 NOTE — Assessment & Plan Note (Signed)
Improving, wil rx robaxin instead of flexeril as more effective

## 2022-06-15 NOTE — Assessment & Plan Note (Signed)
Asked:confirms currently smokes cigarettes °Assess: Unwilling to set a quit date, but is cutting back °Advise: needs to QUIT to reduce risk of cancer, cardio and cerebrovascular disease °Assist: counseled for 5 minutes and literature provided °Arrange: follow up in 2 to 4 months ° °

## 2022-06-15 NOTE — Assessment & Plan Note (Signed)
Needs to stop caffeing, will improve GERD , no documentation of hiatal hernia on record review

## 2022-06-15 NOTE — Patient Instructions (Signed)
Annual exam in December, call if you need me sooner  Flu vaccine today  New for neck spasm is robaxin, take every 8 to 12 hours as needed, should not need after 1 week. STOP cyclobenzaprine  Images of your neck are reported normal  No mention of hiatal hernia on review of scans.  Please stop the cofee in the morning as your body is telling you to,  and cut out caffeine  to reduce reflux symptoms  Continue to work on cutting back cigarettes, now at 10/day  Thanks for choosing [facility], we consider it a privelige to serve you.

## 2022-06-15 NOTE — Assessment & Plan Note (Signed)
Annette [name] is reminded of the importance of commitment to daily physical activity for 30 minutes or more, as able and the need to limit carbohydrate intake to 30 to 60 grams per meal to help with blood sugar control.   The need to take medication as prescribed, test blood sugar as directed, and to call between visits if there is a concern that blood sugar is uncontrolled is also discussed.   Annette [name] is reminded of the importance of daily foot exam, annual eye examination, and good blood sugar, blood pressure and cholesterol control.     Latest Ref Rng & Units 06/14/2022    3:10 PM 05/28/2022    8:00 PM 02/08/2022    2:53 PM 01/26/2022    4:01 PM 09/02/2021    4:02 PM  Diabetic Labs  HbA1c 4.0 - 5.6 % 7.1   6.9  6.7    Chol 100 - 199 mg/dL    142    HDL >39 mg/dL    56    Calc LDL 0 - 99 mg/dL    66    Triglycerides 0 - 149 mg/dL    108    Creatinine 0.44 - 1.00 mg/dL  0.98   1.05  0.96       06/15/2022    3:22 PM 06/14/2022    2:37 PM 06/02/2022    2:51 PM 05/28/2022   11:22 PM 05/28/2022    9:40 PM 05/28/2022    5:11 PM 02/17/2022   11:01 AM  BP/Weight  Systolic BP 84 263 90 335 456 256 95  Diastolic BP 48 76 64 61 61 64 64  Wt. (Lbs) 193.04 193 197.3   190   BMI 31.16 kg/m2 31.15 kg/m2 31.85 kg/m2   30.67 kg/m2       06/14/2022    3:00 PM 02/08/2022    3:00 PM  Foot/eye exam completion dates  Foot Form Completion Done Done      Improved managed by Endo

## 2022-06-16 LAB — FRUCTOSAMINE: Fructosamine: 274 umol/L (ref 205–285)

## 2022-06-18 ENCOUNTER — Other Ambulatory Visit (HOSPITAL_COMMUNITY): Payer: Self-pay

## 2022-06-18 ENCOUNTER — Telehealth: Payer: Self-pay

## 2022-06-18 ENCOUNTER — Other Ambulatory Visit: Payer: Self-pay | Admitting: Family Medicine

## 2022-06-18 DIAGNOSIS — E7800 Pure hypercholesterolemia, unspecified: Secondary | ICD-10-CM

## 2022-06-18 DIAGNOSIS — E1165 Type 2 diabetes mellitus with hyperglycemia: Secondary | ICD-10-CM

## 2022-06-18 NOTE — Telephone Encounter (Signed)
Patient Advocate Encounter   Received notification from [facility] that prior authorization is required for [employer] 3 Sensor  Submitted: 06-18-2022 [facility]

## 2022-06-21 MED ORDER — DEXCOM G7 RECEIVER DEVI: 0 refills | Status: DC

## 2022-06-21 MED ORDER — DEXCOM G7 SENSOR MISC: 3 refills | Status: DC

## 2022-06-21 NOTE — Telephone Encounter (Signed)
Determination letter states Dexcom is preferred CGM. Alternative sent to preferred pharmacy.

## 2022-06-21 NOTE — Telephone Encounter (Signed)
Patient Advocate Encounter  Received a fax from [facility] regarding Prior Authorization for [employer] 3 Sensor.   Authorization has been DENIED due to Your plan only covers this product when your doctor gives [place] a medical reason you cannot use other products. We have denied your request because you do not meet this condition.  Determination letter attached to patient chart.

## 2022-06-21 NOTE — Addendum Note (Signed)
Addended by: Lauralyn Primes on: 06/21/2022 01:14 PM   Modules accepted: Orders

## 2022-06-26 ENCOUNTER — Encounter: Payer: Self-pay | Admitting: Internal Medicine

## 2022-07-09 ENCOUNTER — Other Ambulatory Visit: Payer: Self-pay

## 2022-07-09 DIAGNOSIS — F5104 Psychophysiologic insomnia: Secondary | ICD-10-CM

## 2022-07-09 MED ORDER — VENLAFAXINE HCL ER 75 MG PO CP24: 75.0000 mg | ORAL_CAPSULE | Freq: Every day | ORAL | 1 refills | Status: DC

## 2022-07-13 ENCOUNTER — Encounter: Payer: Self-pay | Admitting: Family Medicine

## 2022-07-14 ENCOUNTER — Other Ambulatory Visit: Payer: Self-pay | Admitting: Family Medicine

## 2022-07-14 MED ORDER — LORAZEPAM 1 MG PO TABS: ORAL_TABLET | ORAL | 1 refills | Status: DC

## 2022-07-26 ENCOUNTER — Other Ambulatory Visit: Payer: Self-pay | Admitting: Internal Medicine

## 2022-08-03 ENCOUNTER — Telehealth: Payer: Self-pay

## 2022-08-03 ENCOUNTER — Other Ambulatory Visit: Payer: Self-pay | Admitting: Internal Medicine

## 2022-08-03 DIAGNOSIS — E1165 Type 2 diabetes mellitus with hyperglycemia: Secondary | ICD-10-CM

## 2022-08-03 NOTE — Telephone Encounter (Signed)
Pt called requesting a referral for [facility]. Unable to get an appt until March without one.

## 2022-08-03 NOTE — Telephone Encounter (Signed)
OK, I placed the referral.

## 2022-08-14 ENCOUNTER — Encounter (INDEPENDENT_AMBULATORY_CARE_PROVIDER_SITE_OTHER): Payer: Self-pay | Admitting: Gastroenterology

## 2022-08-17 ENCOUNTER — Other Ambulatory Visit: Payer: Self-pay | Admitting: Internal Medicine

## 2022-08-21 ENCOUNTER — Other Ambulatory Visit: Payer: Self-pay | Admitting: Family Medicine

## 2022-08-21 DIAGNOSIS — F5104 Psychophysiologic insomnia: Secondary | ICD-10-CM

## 2022-09-01 ENCOUNTER — Ambulatory Visit: Payer: BC Managed Care – PPO | Admitting: Internal Medicine

## 2022-09-01 ENCOUNTER — Encounter: Payer: Self-pay | Admitting: Internal Medicine

## 2022-09-01 DIAGNOSIS — J208 Acute bronchitis due to other specified organisms: Secondary | ICD-10-CM

## 2022-09-01 MED ORDER — ALBUTEROL SULFATE HFA 108 (90 BASE) MCG/ACT IN AERS: 2.0000 | INHALATION_SPRAY | Freq: Four times a day (QID) | RESPIRATORY_TRACT | 2 refills | Status: DC | PRN

## 2022-09-01 MED ORDER — VIRTUSSIN A/C 100-10 MG/5ML PO SOLN: 5.0000 mL | Freq: Three times a day (TID) | ORAL | 0 refills | Status: DC | PRN

## 2022-09-01 NOTE — Progress Notes (Signed)
   Acute Telephone Visit  Virtual Visit via Telephone Note  I connected with Annette [name] on 09/01/22 at  3:40 PM EST by telephone and verified that I am speaking with the correct person using two identifiers.  Chief Complaint  Patient presents with   Nasal Congestion    08/15/22 started with sore throat, then went into sinus. Nausea, vomiting.   Location: Patient: [address] [place] [address] Provider: [phone] [address], [place], [place] [address]   I discussed the limitations, risks, security and privacy concerns of performing an evaluation and management service by telephone and the availability of in person appointments. I also discussed with the patient that there may be a patient responsible charge related to this service. The patient expressed understanding and agreed to proceed.  History of Present Illness:  Annette [name] was evaluated today via acute telephone encounter reporting a history of cough, shortness of breath, sore throat, and nasal congestion.  She reports onset of symptoms 11/19, which began as a sore throat and gradually migrated to chest congestion.  She denies fever/chills currently.  She has no known sick contacts.  Her chief concern is cough and shortness of breath.  She denies any sputum production currently.  She has mild nasal congestion that does not significantly bother her.  Annette [name] has been attempting to manage her symptoms with Mucinex, Robitussin, and albuterol inhaler, and leftover Bactrim.  Overall she feels as though her symptoms have not significantly improved since onset.  This morning she had an episode of nausea with vomiting as well.  She is interested in additional treatment options.  Assessment and Plan:  Viral bronchitis Evaluated today for the symptoms endorsed above.  Her symptoms began 11/19.  She has been using Mucinex, Robitussin, albuterol, and leftover Bactrim.  Her symptoms have not significantly improved.  She is most  concerned about wheezing and cough with resulting shortness of breath.  Currently her symptoms seem most consistent with viral bronchitis. -I have prescribed her Virtussin for cough relief today.  She states that previously cough suppressants with codeine has been the only effective medications.  Given the duration of her symptoms without improvement, it is reasonable to prescribe Virtussin. -I have ordered an albuterol inhaler today.  When she is currently using belongs to her sister -I recommended that she stop taking leftover Bactrim as this does not seem consistent with a bacterial infection and places her at an increased risk of developing resistant bacterial strains. -She has previously scheduled follow-up with Dr. Moshe Cipro on 12/15.  Follow Up Instructions:   I discussed the assessment and treatment plan with the patient. The patient was provided an opportunity to ask questions and all were answered. The patient agreed with the plan and demonstrated an understanding of the instructions.   The patient was advised to call back or seek an in-person evaluation if the symptoms worsen or if the condition fails to improve as anticipated.  I provided 11 minutes of non-face-to-face time during this encounter.   Johnette Abraham, MD

## 2022-09-02 ENCOUNTER — Encounter: Payer: Self-pay | Admitting: Family Medicine

## 2022-09-02 ENCOUNTER — Telehealth: Payer: Self-pay | Admitting: Family Medicine

## 2022-09-02 DIAGNOSIS — G4730 Sleep apnea, unspecified: Secondary | ICD-10-CM

## 2022-09-02 NOTE — Telephone Encounter (Signed)
Spoke with pt not seeing anything in cover my meds on this haven't received a fax either. Pt saw Dr Doren Custard 09/01/22, do you have anything on this/

## 2022-09-02 NOTE — Telephone Encounter (Signed)
Patient called said pharmacy sent prior authorization electronic today and If done electronic put on the authorization as URGENT and patient will be able to pick it up tomorrow.     Ph[facility]  [id number] [place] [id number]: [phone] Fax: [phone] #: [id number]

## 2022-09-03 ENCOUNTER — Other Ambulatory Visit: Payer: Self-pay | Admitting: Internal Medicine

## 2022-09-03 MED ORDER — ALBUTEROL SULFATE HFA 108 (90 BASE) MCG/ACT IN AERS: 2.0000 | INHALATION_SPRAY | Freq: Four times a day (QID) | RESPIRATORY_TRACT | 1 refills | Status: DC | PRN

## 2022-09-03 NOTE — Telephone Encounter (Signed)
Yes, it got denied and I sent dixon a message about it already this morning. He just hasn't had time to look at the message yet.

## 2022-09-03 NOTE — Telephone Encounter (Signed)
Spoke with pt advised of referral, pt states that she can not afford a sleep study and is on a fixed income. Pt states that she will go with out it.

## 2022-09-03 NOTE — Telephone Encounter (Signed)
Please advise, would you like to refer her to pulmonary or would you like to order another sleep study

## 2022-09-14 ENCOUNTER — Other Ambulatory Visit (HOSPITAL_COMMUNITY)
Admission: RE | Admit: 2022-09-14 | Discharge: 2022-09-14 | Disposition: A | Discharge status: Home / Self Care | Payer: BC Managed Care – PPO | Source: Ambulatory Visit | Attending: Family Medicine | Admitting: Family Medicine

## 2022-09-14 ENCOUNTER — Encounter: Payer: Self-pay | Admitting: Family Medicine

## 2022-09-14 ENCOUNTER — Other Ambulatory Visit: Payer: Self-pay | Admitting: Family Medicine

## 2022-09-14 ENCOUNTER — Ambulatory Visit (INDEPENDENT_AMBULATORY_CARE_PROVIDER_SITE_OTHER): Payer: BC Managed Care – PPO | Admitting: Family Medicine

## 2022-09-14 ENCOUNTER — Other Ambulatory Visit: Payer: Self-pay

## 2022-09-14 VITALS — BP 86/56 | HR 118 | Ht 66.0 in | Wt 192.0 lb

## 2022-09-14 DIAGNOSIS — Z0001 Encounter for general adult medical examination with abnormal findings: Secondary | ICD-10-CM

## 2022-09-14 DIAGNOSIS — E1169 Type 2 diabetes mellitus with other specified complication: Secondary | ICD-10-CM | POA: Diagnosis not present

## 2022-09-14 DIAGNOSIS — Z124 Encounter for screening for malignant neoplasm of cervix: Secondary | ICD-10-CM

## 2022-09-14 DIAGNOSIS — Z23 Encounter for immunization: Secondary | ICD-10-CM | POA: Diagnosis not present

## 2022-09-14 DIAGNOSIS — Z794 Long term (current) use of insulin: Secondary | ICD-10-CM

## 2022-09-14 DIAGNOSIS — G4730 Sleep apnea, unspecified: Secondary | ICD-10-CM | POA: Diagnosis not present

## 2022-09-14 DIAGNOSIS — J439 Emphysema, unspecified: Secondary | ICD-10-CM

## 2022-09-14 DIAGNOSIS — E785 Hyperlipidemia, unspecified: Secondary | ICD-10-CM

## 2022-09-14 DIAGNOSIS — Z0000 Encounter for general adult medical examination without abnormal findings: Secondary | ICD-10-CM

## 2022-09-14 MED ORDER — LORAZEPAM 1 MG PO TABS: ORAL_TABLET | ORAL | 5 refills | Status: DC

## 2022-09-14 MED ORDER — TEMAZEPAM 15 MG PO CAPS
15.0000 mg | ORAL_CAPSULE | Freq: Every evening | ORAL | 1 refills | Status: DC | PRN
Start: 2022-09-14 — End: 2022-12-14

## 2022-09-14 NOTE — Telephone Encounter (Signed)
Please send electronically if you would like  

## 2022-09-14 NOTE — Patient Instructions (Addendum)
F/u in 3 months, clal if you need me sooner  You are referred to pulmonary doctor re CPAP and evaluation for emphysema  Foot exam is normal  Please measure/ watch/ reduce Carbs so blood sugar is controlled Lipid, cmp and EGFFr and microalb today  TdAP today  Start 14 cigg/ day next week then cut back after 2 weeks to 13 and keep going down  Please reduce caffeine and nicotine to reduce reflux   Lung chest scan for screening is past due, you should get an appt scheduled in the next 1 to 2 weeks, pls let us know if not  Best for 2024!

## 2022-09-14 NOTE — Progress Notes (Signed)
Annette [name]     MRN: [id number]      DOB: [birth year]  HPI: Patient is in for annual physical exam.  Recent labs,  are reviewed. Immunization is reviewed , and  updated   PE: BP (!) 86/56 (BP Location: Right Arm, Patient Position: Sitting, Cuff Size: Normal)   Pulse (!) 118   Ht '5\' 6"'$  (1.676 m)   Wt 192 lb 0.6 oz (87.1 kg)   SpO2 94%   BMI 31.00 kg/m   Pleasant  female, alert and oriented x 3, in no cardio-pulmonary distress. Afebrile. HEENT No facial trauma or asymetry. Sinuses non tender.  Extra occullar muscles intact.. External ears normal, . Neck: supple, no adenopathy,JVD or thyromegaly.No bruits.  Chest: Clear to ascultation bilaterally.No crackles or wheezes.Decreased air entry  Non tender to palpation  Breast: No asymetry,no masses or lumps. No tenderness. No nipple discharge or inversion. No axillary or supraclavicular adenopathy  Cardiovascular system; Heart sounds normal,  S1 and  S2 ,no S3.   Apical beat not displaced Peripheral pulses normal.  Abdomen: Soft, non tender,  GU: External genitalia normal female genitalia , normal female distribution of hair. No lesions. Urethral meatus normal in size, no  Prolapse, no lesions visibly  Present. Bladder non tender. Vagina pink and moist , with no visible lesions , discharge present . Adequate pelvic support no  cystocele or rectocele noted Uterus absent, no adnexal masses, no  adnexal tenderness.   Musculoskeletal exam: Decreased though adequate  ROM of spine, hips , shoulders and knees. Neurologic: Cranial nerves 2 to 12 intact. Power, tone ,sensation and reflexes normal throughout. No disturbance in gait. No tremor.  Skin: Intact, no ulceration, erythema , scaling or rash noted. Pigmentation normal throughout  Psych; Normal mood and affect. Judgement and concentration normal   Assessment & Plan:  Encounter for annual physical exam Annual exam as documented. Counseling done  re  healthy lifestyle involving commitment to 150 minutes exercise per week, heart healthy diet, and attaining healthy weight.The importance of adequate sleep also discussed.  Immunization and cancer screening needs are specifically addressed at this visit.   Sleep apnea Needs re evaluation and equipment ordered refer Pulmonary  Type 2 diabetes mellitus with other specified complication ([place]) [name] is reminded of the importance of commitment to daily physical activity for 30 minutes or more, as able and the need to limit carbohydrate intake to 30 to 60 grams per meal to help with blood sugar control.   The need to take medication as prescribed, test blood sugar as directed, and to call between visits if there is a concern that blood sugar is uncontrolled is also discussed.   [name] is reminded of the importance of daily foot exam, annual eye examination, and good blood sugar, blood pressure and cholesterol control.     Latest Ref Rng & Units 09/14/2022    4:16 PM 06/14/2022    3:10 PM 05/28/2022    8:00 PM 02/08/2022    2:53 PM 01/26/2022    4:01 PM  Diabetic Labs  HbA1c 4.0 - 5.6 %  7.1   6.9  6.7   Micro/Creat Ratio 0 - 29 mg/g creat 18       Chol 100 - 199 mg/dL 130     142   HDL >39 mg/dL 47     56   Calc LDL 0 - 99 mg/dL 62     66   Triglycerides 0 - 149 mg/dL 114  108   Creatinine 0.57 - 1.00 mg/dL 1.14   0.98   1.05       09/14/2022    2:52 PM 06/15/2022    3:22 PM 06/14/2022    2:37 PM 06/02/2022    2:51 PM 05/28/2022   11:22 PM 05/28/2022    9:40 PM 05/28/2022    5:11 PM  BP/Weight  Systolic BP 86 84 295 90 284 132 440  Diastolic BP 56 48 76 64 61 61 64  Wt. (Lbs) 192.04 193.04 193 197.3   190  BMI 31 kg/m2 31.16 kg/m2 31.15 kg/m2 31.85 kg/m2   30.67 kg/m2      06/14/2022    3:00 PM 02/08/2022    3:00 PM  Foot/eye exam completion dates  Foot Form Completion Done Done      Not at goal, managed by Endo

## 2022-09-16 LAB — CMP14+EGFR
ALT: 18 IU/L (ref 0–32)
AST: 20 IU/L (ref 0–40)
Albumin/Globulin Ratio: 1.6 (ref 1.2–2.2)
Albumin: 4.5 g/dL (ref 3.9–4.9)
Alkaline Phosphatase: 100 IU/L (ref 44–121)
BUN/Creatinine Ratio: 13 (ref 12–28)
BUN: 15 mg/dL (ref 8–27)
Bilirubin Total: 0.4 mg/dL (ref 0.0–1.2)
CO2: 25 mmol/L (ref 20–29)
Calcium: 10 mg/dL (ref 8.7–10.3)
Chloride: 101 mmol/L (ref 96–106)
Creatinine, Ser: 1.14 mg/dL — ABNORMAL HIGH (ref 0.57–1.00)
Globulin, Total: 2.8 g/dL (ref 1.5–4.5)
Glucose: 124 mg/dL — ABNORMAL HIGH (ref 70–99)
Potassium: 4.3 mmol/L (ref 3.5–5.2)
Sodium: 142 mmol/L (ref 134–144)
Total Protein: 7.3 g/dL (ref 6.0–8.5)
eGFR: 54 mL/min/{1.73_m2} — ABNORMAL LOW (ref >59)

## 2022-09-16 LAB — LIPID PANEL
Chol/HDL Ratio: 2.8 ratio (ref 0.0–4.4)
Cholesterol, Total: 130 mg/dL (ref 100–199)
HDL: 47 mg/dL (ref >39)
LDL Chol Calc (NIH): 62 mg/dL (ref 0–99)
Triglycerides: 114 mg/dL (ref 0–149)
VLDL Cholesterol Cal: 21 mg/dL (ref 5–40)

## 2022-09-16 LAB — MICROALBUMIN / CREATININE URINE RATIO
Creatinine, Urine: 276.8 mg/dL
Microalb/Creat Ratio: 18 mg/g creat (ref 0–29)
Microalbumin, Urine: 48.5 ug/mL

## 2022-09-17 LAB — CYTOLOGY - PAP
Comment: NEGATIVE
Diagnosis: NEGATIVE
High risk HPV: NEGATIVE

## 2022-09-18 ENCOUNTER — Encounter: Payer: Self-pay | Admitting: Family Medicine

## 2022-09-18 DIAGNOSIS — Z0000 Encounter for general adult medical examination without abnormal findings: Secondary | ICD-10-CM | POA: Insufficient documentation

## 2022-09-18 DIAGNOSIS — Z0001 Encounter for general adult medical examination with abnormal findings: Secondary | ICD-10-CM | POA: Insufficient documentation

## 2022-09-18 NOTE — Assessment & Plan Note (Addendum)
Ms. Tartaglia is reminded of the importance of commitment to daily physical activity for 30 minutes or more, as able and the need to limit carbohydrate intake to 30 to 60 grams per meal to help with blood sugar control.   The need to take medication as prescribed, test blood sugar as directed, and to call between visits if there is a concern that blood sugar is uncontrolled is also discussed.   Ms. Obrien is reminded of the importance of daily foot exam, annual eye examination, and good blood sugar, blood pressure and cholesterol control.     Latest Ref Rng & Units 09/14/2022    4:16 PM 06/14/2022    3:10 PM 05/28/2022    8:00 PM 02/08/2022    2:53 PM 01/26/2022    4:01 PM  Diabetic Labs  HbA1c 4.0 - 5.6 %  7.1   6.9  6.7   Micro/Creat Ratio 0 - 29 mg/g creat 18       Chol 100 - 199 mg/dL 130     142   HDL >39 mg/dL 47     56   Calc LDL 0 - 99 mg/dL 62     66   Triglycerides 0 - 149 mg/dL 114     108   Creatinine 0.57 - 1.00 mg/dL 1.14   0.98   1.05       09/14/2022    2:52 PM 06/15/2022    3:22 PM 06/14/2022    2:37 PM 06/02/2022    2:51 PM 05/28/2022   11:22 PM 05/28/2022    9:40 PM 05/28/2022    5:11 PM  BP/Weight  Systolic BP 86 84 333 90 832 919 166  Diastolic BP 56 48 76 64 61 61 64  Wt. (Lbs) 192.04 193.04 193 197.3   190  BMI 31 kg/m2 31.16 kg/m2 31.15 kg/m2 31.85 kg/m2   30.67 kg/m2      06/14/2022    3:00 PM 02/08/2022    3:00 PM  Foot/eye exam completion dates  Foot Form Completion Done Done      Not at goal, managed by Endo

## 2022-09-18 NOTE — Assessment & Plan Note (Signed)
Needs re evaluation and equipment ordered refer Pulmonary

## 2022-09-18 NOTE — Assessment & Plan Note (Addendum)
Annual exam as documented. Counseling done  re healthy lifestyle involving commitment to 150 minutes exercise per week, heart healthy diet, and attaining healthy weight.The importance of adequate sleep also discussed.  Immunization and cancer screening needs are specifically addressed at this visit.  

## 2022-10-05 ENCOUNTER — Other Ambulatory Visit: Payer: Self-pay

## 2022-10-05 DIAGNOSIS — Z122 Encounter for screening for malignant neoplasm of respiratory organs: Secondary | ICD-10-CM

## 2022-10-05 DIAGNOSIS — Z87891 Personal history of nicotine dependence: Secondary | ICD-10-CM

## 2022-10-05 NOTE — Progress Notes (Signed)
LDCT order placed per protocol. Scan scheduled for 02/07 at 1:15p

## 2022-10-18 ENCOUNTER — Other Ambulatory Visit: Payer: Self-pay | Admitting: Family Medicine

## 2022-10-19 ENCOUNTER — Encounter: Payer: Self-pay | Admitting: Internal Medicine

## 2022-10-19 ENCOUNTER — Ambulatory Visit: Payer: BC Managed Care – PPO | Admitting: Internal Medicine

## 2022-10-19 VITALS — BP 128/78 | HR 91 | Ht 66.0 in | Wt 193.6 lb

## 2022-10-19 DIAGNOSIS — E278 Other specified disorders of adrenal gland: Secondary | ICD-10-CM

## 2022-10-19 DIAGNOSIS — E669 Obesity, unspecified: Secondary | ICD-10-CM

## 2022-10-19 DIAGNOSIS — Z794 Long term (current) use of insulin: Secondary | ICD-10-CM

## 2022-10-19 DIAGNOSIS — E782 Mixed hyperlipidemia: Secondary | ICD-10-CM | POA: Diagnosis not present

## 2022-10-19 DIAGNOSIS — E1165 Type 2 diabetes mellitus with hyperglycemia: Secondary | ICD-10-CM | POA: Diagnosis not present

## 2022-10-19 LAB — POCT GLYCOSYLATED HEMOGLOBIN (HGB A1C): Hemoglobin A1C: 7.4 % — AB (ref 4.0–5.6)

## 2022-10-19 MED ORDER — BD PEN NEEDLE NANO 2ND GEN 32G X 4 MM MISC: 1 refills | Status: AC

## 2022-10-19 MED ORDER — TRULICITY 1.5 MG/0.5ML ~~LOC~~ SOAJ: SUBCUTANEOUS | 3 refills | Status: DC

## 2022-10-19 NOTE — Patient Instructions (Addendum)
Please try to increase: - Tresiba 30 units daily  Continue: - Trulicity 1.5 mg weekly  Try to eliminate the snack at night or to move it after lunch or right after dinner. If sugars remain elevated in am after this, then try to increase the insulin dose.  Please return in 4 months with your sugar log.

## 2022-10-19 NOTE — Progress Notes (Signed)
Patient ID: Annette [name], female   DOB: 01/09/1961, 62 y.o.   MRN: [id number]   HPI: Annette [name] is a 62 y.o.-year-old female, initially referred by her PCP, Dr. Moshe Cipro, returning for follow-up for DM2, dx in 2018, insulin-dependent since 03/2019, uncontrolled, without long-term complications.  She previously saw Dr. Dorris Fetch and Dr. Chalmers Cater but then switched to seeing me.  Last visit 4 months ago.  He is here with her husband who offers part of the history.  Interim history: She continues to have some nausea. She has blurry vision, but no increased urination, chest pain.  Reviewed HbA1c levels: 06/14/2022: HbA1c calculated from fructosamine normal, at 6.27%, correlating better with blood sugar checks at home. Lab Results  Component Value Date   HGBA1C 7.1 (A) 06/14/2022   HGBA1C 6.9 (A) 02/08/2022   HGBA1C 6.7 (H) 01/26/2022   HGBA1C 5.9 (A) 05/19/2021   HGBA1C 6.0 (H) 01/17/2021   HGBA1C 6.4 (H) 12/03/2020   HGBA1C 6.6 (A) 09/29/2020   HGBA1C 7.0 (A) 06/24/2020   HGBA1C 9.4 (A) 03/11/2020   HGBA1C 7.5 (H) 07/25/2019   Previously on: - Glipizide XL 20 mg before breakfast - Lantus 50 units at bedtime (dose increased 04/08/2020) She could not tolerate Metformin due to GI intolerance - abdominal pain and diarrhea. She had vaginal yeast infections and UTIs from [place]. Previously on Tresiba, but unclear why changed to Lantus.  Currently on: - Trulicity 4.09 >> 1.5 mg weekly - started 04/2020 -tolerated well - Tresiba 50 units at bedtime  >> Toujeo 50 >> 56 units at bedtime >> Tresiba 50 >> 30-46 >> 24 >> 24-30 >> 26 units daily We stopped glipizide XL 01/2021.  Pt checks her sugars twice a day: - am: 80-112 >> 77, 94, 102-135 >> 93-134, 144 >> 130s-155 - 2h after b'fast: n/c - before lunch: n/c >> 120 >> n/c - 2h after lunch: n/c >> 64 >> n/c - before dinner: 62, 65, 70-176, 199 >> 84-139 >> 90s-120s - 2h after dinner: 133, 143, 177 >> 106-166 >> 125-150 >> n/c - bedtime:  n/c >> 94 >> n/c - nighttime: n/c Lowest sugar was 43 (am) >> 67 >> 62 >> 68; she has hypoglycemia awareness in the 90s.  Highest sugar was 425 ... >> 176.  Glucometer: One Touch verio  She is again only eating 1 meal a day per husband's report She only drinks unsweetened tea and coffee. She eats a Healthy Choice for lunch and occasionally dinner.  -She has no history of CKD, last BUN/creatinine:  Lab Results  Component Value Date   BUN 15 09/14/2022   BUN 14 05/28/2022   CREATININE 1.14 (H) 09/14/2022   CREATININE 0.98 05/28/2022  Not on ACE inhibitor/ARB.  + HL; last set of lipids: Lab Results  Component Value Date   CHOL 130 09/14/2022   HDL 47 09/14/2022   LDLCALC 62 09/14/2022   TRIG 114 09/14/2022   CHOLHDL 2.8 09/14/2022  On Crestor 20. She was also on Zetia but she did not feel well on it >> stopped.  - last eye exam was in fall 2020: No DR reportedly.  At last visit she described occasional double vision.  I referred her to [facility] >> pending 12/2022.  - no numbness but some pain and tingling in her right foot -after her back surgery  Pt has FH of DM in mother, brother, sister.  She also has a history of adrenal adenomas: - she had previous adrenalectomy by Dr.  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   Received labeled "left adrenal gland" is a 30 g adrenal gland  which is received transected by the surgeon which, when  reapproximated has an overall measurement of 8.5 x 4.5 x 2.8 cm.  The cut surface reveals a central well-circumscribed, yellow  cortical nodule measuring 4 x 3.5 x 2.8 cm. The nodule is focally  compressing the adrenal gland. Within the surrounding soft tissue  there is a 0.3 cm yellow nodule which has a similar appearance to  the cortical nodule representative sections are submitted as  follows:   -   she was found to have 2 adrenal adenomas, which is investigated by oncology.  They had negative Hounsfield units on CT, consistent with benign nodules.  Hormonal investigation was negative: Component     Latest Ref Rng 09/07/2021 09/09/2021 11/02/2021  Epinephrine, Rand Ur     Undefined ug/L  1    Epinephrine, U, 24Hr     0 - 20 ug/24 hr  2    Norepinephrine, Rand Ur     Undefined ug/L  19    Norepinephrine,U,24H     0 - 135 ug/24 hr  29    Dopamine, Rand Ur     Undefined ug/L  104    Dopamine, Ur, 24Hr     0 - 510 ug/24 hr  156    Total Volume  1,500    Total Volume  1,500    Normetanephrine, Ur     Undefined ug/L  142    Normetanephr.,U,24h     131 - 612 ug/24 hr  213    Metanephrine, Ur     Undefined ug/L  26    Metanephrines, 24H Ur     36 - 209 ug/24 hr  39    Norepinephrine     0 - 874 pg/mL 745     Epinephrine     0 - 62 pg/mL 19     Dopamine     0 - 48 pg/mL 40     ALDOSTERONE      ng/dL   27   Renin Activity     0.25 - 5.82 ng/mL/h   13.41 (H)   ALDO / PRA Ratio     0.9 - 28.9 Ratio   2.0   Normetanephrine, Pl     0.0 - 285.2 pg/mL 106.9     Metanephrine, Pl     0.0 - 88.0 pg/mL 10.9     C206 ACTH     6 - 50 pg/mL 4.2 (L)   23   Cortisol, Plasma     ug/dL 6.6   44.3   Cortisol, Plasma        37.2   Cortisol, Plasma        24.4   Potassium     3.5 - 5.1 mEq/L   3.8    Dexamethasone suppression test was normal: Component     Latest Ref Rng 12/03/2021  Dexamethasone, Serum     ng/dL 432   Cortisol - AM     mcg/dL 2.6 (L)     Previous investigation was also negative: Component     Latest Ref Rng 08/17/2011  Total Volume - CF 24Hr U     mL 1300   Epinephrine, 24 hr Urine     2 -  24 mcg/24 h 4   Norepinephrine, 24 hr Ur     15 - 100 mcg/24 h 39   Calculated Total (E+NE)     26 - 121 mcg/24 h 43   Dopamine, 24 hr Urine     52 - 480 mcg/24 h 139   Creatinine, Urine mg/day-CATEUR     0.63 - 2.50 g/24 h 1.34   Normetanephrine, Ur     Undefined  ug/L   Normetanephr.,U,24h     131 - 612 ug/24 hr 318   Metanephrine, Ur     Undefined ug/L 422   Metanephrines, 24H Ur     36 - 209 ug/24 hr   Metanephrines, Ur     90 - 315 mcg/24 h 104    Component     Latest Ref Rng 08/17/2011  Cortisol, Plasma     ug/dL 14.6   Aldosterone, Serum      ng/dL 23    08/17/2011: Component Ref Range & Units 10 yr ago  Total Volume - CF 24Hr U mL 1300   Epinephrine, 24 hr Urine 2 - 24 mcg/24 h 4   Norepinephrine, 24 hr Ur 15 - 100 mcg/24 h 39   Calculated Total (E+NE) 26 - 121 mcg/24 h 43   Dopamine, 24 hr Urine 52 - 480 mcg/24 h 139   Creatinine, Urine mg/day-CATEUR 0.63 - 2.50 g/24 h 1.34   Resulting Agency  [facility]   Component Ref Range & Units 10 yr ago  Metanephrines, Ur 90 - 315 mcg/24 h 104   Comment: This specimen was submitted with a pH greater than 6.0. Optimum pH for this assay is 1.0-6.0. Improper preservation may compromise the validity of the assay.  Normetanephrine, 24H Ur 122 - 676 mcg/24 h 318   Metaneph Total, Ur 224 - 832 mcg/24 h 422   Comment: A four fold elevation of urinary normetanephrines is extremely likely to be due to a tumor, while a four fold elevation of urinary metanephrines is highly suggestive, but not diagnostic of the tumor. Measurement of plasma Metanephrines and Chromogranin A is recommended for confirmation.  Resulting Agency  [facility]   12/22/2009: Cortisol,F,ug/L,U Undefined ug/L 20   Cortisol, 24H Ur 0-50 ug/24 hr 50    She also has a history of HTN, OSA. She had COVID-19 10/2019. She recovered well afterwards.  She also has a history of partial thyroidectomy. Reviewed latest TSH: Lab Results  Component Value Date   TSH 1.830 01/26/2022   She had back surgery (12/2020).  She developed significant leg pain afterwards which slowly improved.  ROS: + See HPI  I reviewed pt's medications, allergies, PMH, social hx, family hx, and changes were documented in the history of present illness.  Otherwise, unchanged from my initial visit note.  Past Medical History:  Diagnosis Date   Acute blood loss anemia 02/16/2012   Arthritis    Phreesia 10/14/2020   Blood transfusion without reported diagnosis    Phreesia 10/14/2020   Breast cancer ([place]) 2007   History of  XRT, onTamoxifen   Bruises easily    Cancer ([place])    Phreesia 10/14/2020   Depression    Depression    Phreesia 10/14/2020   Diabetes mellitus    prediabetic   Diabetes mellitus without complication ([place])    Phreesia 10/14/2020   GERD (gastroesophageal reflux disease)    Hypertriglyceridemia    Hypothyroidism    following chemo amnd radiaition for breast cancer, needed replacement short term   Hypothyroidism (  acquired)    replaced x 1 year   IBS (irritable bowel syndrome)    Menieres disease    Controlled with triamterene    Migraines    Obesity    Palpitations    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[place])    Sleep apnea 2010   Problems with CPAP   Past Surgical History:  Procedure Laterality Date   ABDOMINAL EXPOSURE N/A 12/03/2020   Procedure: ABDOMINAL EXPOSURE;  Surgeon: Rosetta Posner, MD;  Location: [place] [facility];  Service: Vascular;  Laterality: N/A;   ABDOMINAL HYSTERECTOMY  1998   Benign, fibroids   ADRENALECTOMY  02/16/2012   [facility], splemic trauma, 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   ANTERIOR LUMBAR FUSION N/A 12/03/2020   Procedure: Lumbar Four-Five Lumbar Five Sacral One Anterior lumbar interbody fusion;  Surgeon: Ashok Pall, MD;  Location: [place];  Service: Neurosurgery;  Laterality: N/A;   APPENDECTOMY  06/12/08   BIOPSY  07/02/2020   Procedure: BIOPSY;  Surgeon: Harvel Quale, MD;  [facility];  Service: Gastroenterology;;   BIOPSY  01/22/2021   Procedure: BIOPSY;  Surgeon: Daneil Dolin, MD;  [facility];  Service: Endoscopy;;   BIOPSY  02/17/2022   Procedure: BIOPSY;  Surgeon: Rogene Houston, MD;  [facility];  Service: Endoscopy;;   BREAST LUMPECTOMY Left 2008    BREAST RECONSTRUCTION     Left reconstructive   BREAST SURGERY  2007   Left lumpectomy   BREAST SURGERY     [place]amm[place] - right side   CHOLECYSTECTOMY  1985   COLONOSCOPY WITH PROPOFOL N/A 07/02/2020   Procedure: COLONOSCOPY WITH PROPOFOL;  Surgeon: Harvel Quale, MD;  [facility];  Service: Gastroenterology;  Laterality: N/A;  1045   COLONOSCOPY WITH PROPOFOL N/A 04/01/2021   Procedure: COLONOSCOPY WITH PROPOFOL;  Surgeon: Harvel Quale, MD;  [facility];  Service: Gastroenterology;  Laterality: N/A;  12:50   ESOPHAGEAL DILATION  12/19/2015   Procedure: ESOPHAGEAL DILATION;  Surgeon: Rogene Houston, MD;  [facility];  Service: Endoscopy;;   ESOPHAGOGASTRODUODENOSCOPY N/A 12/19/2015   Procedure: ESOPHAGOGASTRODUODENOSCOPY (EGD);  Surgeon: Rogene Houston, MD;  [facility];  Service: Endoscopy;  Laterality: N/A;  11:40   ESOPHAGOGASTRODUODENOSCOPY (EGD) WITH PROPOFOL N/A 02/17/2022   Procedure: ESOPHAGOGASTRODUODENOSCOPY (EGD) WITH PROPOFOL;  Surgeon: Rogene Houston, MD;  [facility];  Service: Endoscopy;  Laterality: N/A;  [place] N/A 01/22/2021   Procedure: FLEXIBLE SIGMOIDOSCOPY;  Surgeon: Daneil Dolin, MD;  [facility];  Service: Endoscopy;  Laterality: N/A;   HEMOSTASIS CLIP PLACEMENT  04/01/2021   Procedure: HEMOSTASIS CLIP PLACEMENT;  Surgeon: Harvel Quale, MD;  [facility];  Service: Gastroenterology;;  x2   POLYPECTOMY  07/02/2020   Procedure: POLYPECTOMY;  Surgeon: Harvel Quale, MD;  [facility];  Service: Gastroenterology;;   POLYPECTOMY  04/01/2021   Procedure: POLYPECTOMY INTESTINAL;  Surgeon: Harvel Quale, MD;  [facility];  Service: Gastroenterology;;   SPLENECTOMY, TOTAL  04/05/5008   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   THYROIDECTOMY, PARTIAL  11/05/2010   Benign disease    Social History   Socioeconomic History   Marital status: Married    Spouse name: Alroy Dust   Number of children: 2   Years of education: Not on file   Highest education level: Not on file  Occupational History   Not on file  Tobacco Use  Smoking status: Every Day    Packs/day: 0.50    Years: 40.00    Total pack years: 20.00    Types: Cigarettes   Smokeless tobacco: Never   Tobacco comments:    1/2 pack a day  Vaping Use   Vaping Use: Never used  Substance and Sexual Activity   Alcohol use: No    Alcohol/week: 0.0 standard drinks of alcohol    Comment: Encouraged to quit smoking. She has tried the nicotone gum and patches but did not help   Drug use: No   Sexual activity: Yes    Birth control/protection: Surgical  Other Topics Concern   Not on file  Social History Narrative   Not on file   Social Determinants of Health   Financial Resource Strain: Medium Risk (11/11/2020)   Overall Financial Resource Strain (CARDIA)    Difficulty of Paying Living Expenses: Somewhat hard  Food Insecurity: No Food Insecurity (11/11/2020)   Hunger Vital Sign    Worried About Running Out of Food in the Last Year: Never true    Ran Out of Food in the Last Year: Never true  Transportation Needs: No Transportation Needs (11/11/2020)   PRAPARE - Hydrologist (Medical): No    Lack of Transportation (Non-Medical): No  Physical Activity: Inactive (11/11/2020)   Exercise Vital Sign    Days of Exercise per Week: 0 days    Minutes of Exercise per Session: 0 min  Stress: Stress Concern Present (11/11/2020)   [place]    Feeling of Stress : To some extent  Social Connections: Moderately Integrated (11/11/2020)   Social Connection and Isolation Panel [NHANES]    Frequency of Communication with Friends and Family: Three times a week    Frequency of Social Gatherings with Friends and Family: Three times a week     Attends Religious Services: More than 4 times per year    Active Member of Clubs or Organizations: No    Attends Archivist Meetings: Never    Marital Status: Married  Human resources officer Violence: Not At Risk (11/11/2020)   Humiliation, Afraid, Rape, and Kick questionnaire    Fear of Current or Ex-Partner: No    Emotionally Abused: No    Physically Abused: No    Sexually Abused: No   Current Outpatient Medications on File Prior to Visit  Medication Sig Dispense Refill   aspirin EC 81 MG tablet Take 1 tablet (81 mg total) by mouth in the morning. 30 tablet 12   BD PEN NEEDLE NANO 2ND GEN 32G X 4 MM MISC USE ONCE DAILY AS DIRECTED 100 each 0   Dulaglutide (TRULICITY) 1.5 BT/5.1VO SOPN INJECT 1.'5MG'$  SUBCUTANEOUSLY ONCE A WEEK 6 mL 2   fluticasone (FLONASE) 50 MCG/ACT nasal spray Place 2 sprays into both nostrils daily. 16 g 6   glucose blood (ONETOUCH VERIO) test strip USE AS DIRECTED 2 TIMES DAILY 200 strip 3   glucose blood test strip 200 each by Other route See admin instructions. 100 each 1   guaiFENesin-codeine (VIRTUSSIN A/C) 100-10 MG/5ML syrup Take 5 mLs by mouth 3 (three) times daily as needed for cough. 120 mL 0   insulin degludec (TRESIBA FLEXTOUCH) 200 UNIT/ML FlexTouch Pen Inject 26 Units into the skin at bedtime. 18 mL 3   Insulin Pen Needle (PEN NEEDLES) 31G X 6 MM MISC See admin instructions.     ketorolac (TORADOL) 10 MG tablet Take 1  tablet (10 mg total) by mouth every 8 (eight) hours as needed. 20 tablet 0   Lancets (ONETOUCH DELICA PLUS ZOXWRU04V) MISC USE TO TEST 4 TIMES A DAY 100 each 7   LORazepam (ATIVAN) 1 MG tablet Take one tablet by mouth once daily, as needed, for uncontrolled nausea 30 tablet 5   methocarbamol (ROBAXIN-750) 750 MG tablet Take 1 tablet (750 mg total) by mouth every 8 (eight) hours as needed for muscle spasms. 30 tablet 0   omeprazole (PRILOSEC) 40 MG capsule Take 1 capsule (40 mg total) by mouth daily. 7 capsule 0   ondansetron  (ZOFRAN-ODT) 8 MG disintegrating tablet Take 1 tablet (8 mg total) by mouth every 8 (eight) hours as needed. 20 tablet 0   rosuvastatin (CRESTOR) 20 MG tablet TAKE 1 TABLET BY MOUTH EVERY DAY 90 tablet 1   temazepam (RESTORIL) 15 MG capsule Take 1 capsule (15 mg total) by mouth at bedtime as needed for sleep. 90 capsule 1   traZODone (DESYREL) 100 MG tablet TAKE 1 TABLET AT BEDTIME 90 tablet 1   triamterene-hydrochlorothiazide (DYAZIDE) 37.5-25 MG capsule Take 1 each (1 capsule total) by mouth every morning. 90 capsule 1   UNABLE TO FIND CPAP tubing and supplies, face mask Dx: G47.30 1 each 0   venlafaxine XR (EFFEXOR-XR) 75 MG 24 hr capsule TAKE 1 CAPSULE BY MOUTH EVERY DAY 90 capsule 1   No current facility-administered medications on file prior to visit.   Allergies  Allergen Reactions   Penicillins Swelling    Has patient had a PCN reaction causing immediate rash, facial/tongue/throat swelling, SOB or lightheadedness with hypotension: Yes Has patient had a PCN reaction causing severe rash involving mucus membranes or skin necrosis: No Has patient had a PCN reaction that required hospitalization: No  Has patient had a PCN reaction occurring within the last 10 years: No If all of the above answers are "NO", then may proceed with Cephalosporin use.   Of face and tongue.   Metformin And Related Diarrhea and Nausea Only    Pt to discontinue med due to intolerance, cramps and diarrhea   Wound Dressing Adhesive Itching and Rash    Dermabond and other surgical glues for incisions   Jardiance [Empagliflozin] Other (See Comments)    Yeast infection     Statins Other (See Comments)    Fell bad but on Crestor   Zetia [Ezetimibe] Other (See Comments)    Feels funny   Family History  Problem Relation Age of Onset   Diabetes Mother    Arrhythmia Mother    Heart disease Father    Hyperlipidemia Father    Arrhythmia Father    Cancer Paternal Grandmother        Breast   Diabetes Sister     Diabetes Brother    Heart disease Brother    PE: BP 128/78 (BP Location: Right Arm, Patient Position: Sitting, Cuff Size: Normal)   Pulse 91   Ht '5\' 6"'$  (1.676 m)   Wt 193 lb 9.6 oz (87.8 kg)   SpO2 98%   BMI 31.25 kg/m  Wt Readings from Last 3 Encounters:  10/19/22 193 lb 9.6 oz (87.8 kg)  09/14/22 192 lb 0.6 oz (87.1 kg)  06/15/22 193 lb 0.6 oz (87.6 kg)   Constitutional: overweight, in NAD Eyes: EOMI, no exophthalmos ENT: no thyromegaly, no cervical lymphadenopathy Cardiovascular: RRR, No MRG Respiratory: CTA B Musculoskeletal: no deformities Skin:  no rashes Neurological: no tremor with outstretched hands Diabetic Foot Exam -  Simple   Simple Foot Form Diabetic Foot exam was performed with the following findings: Yes 10/19/2022  2:57 PM  Visual Inspection No deformities, no ulcerations, no other skin breakdown bilaterally: Yes Sensation Testing Intact to touch and monofilament testing bilaterally: Yes Pulse Check Posterior Tibialis and Dorsalis pulse intact bilaterally: Yes Comments     ASSESSMENT: 1. DM2, insulin-dependent, uncontrolled, without long-term complications, but with hyperglycemia  No family history of medullary thyroid cancer or personal history of pancreatitis.  2. HL  3.  Obesity class II  PLAN:  1. Patient with longstanding, uncontrolled, type 2 diabetes, on injectable antidiabetic regimen with long-acting insulin and weekly GLP-1 receptor agonist, with sugars mostly at goal at last visit.  At that time, the measured HbA1c was 7.1%, but this was higher than expected from her blood sugars at home.  Indeed, an HbA1c calculated from fructosamine was lower, at 6.27%.  We did not change her regimen at that time.  However, I did advise her to write comments in her log about the abnormal blood sugars.  At that time, she was only checking blood sugars in the morning and before dinner and we discussed about rotating the check times.  She was interested in a  CGM, for which I sent the prescription to her pharmacy.  She was not able to obtain it. -At today's visit, sugars are higher in the morning and they remain at goal before dinner.  Upon questioning, she is having snacks after dinner, in the form of Atkins bar or ice cream.  We discussed about trying to stop these, or if not, to move the right after lunch or, as a last resort, right after dinner.  If the sugars remain elevated after this, she may need to increase the [place] dose.  For now, we will continue the same dose of Trulicity.  She has some nausea but she does not feel that this is related to Trulicity. - I suggested to:  Patient Instructions  Please try to increase: - Tresiba 30 units daily  Continue: - Trulicity 1.5 mg weekly  Try to eliminate the snack at night or to move it after lunch or right after dinner. If sugars remain elevated in am after this, then try to increase the insulin dose.  Please return in 4 months with your sugar log.   - we checked her HbA1c: 7.4% - higher - advised to check sugars at different times of the day - 4x a day, rotating check times - advised for yearly eye exams >> she is UTD - return to clinic in 4 months  2. HL -Reviewed latest lipid panel from 09/2022: Fractions at goal: Lab Results  Component Value Date   CHOL 130 09/14/2022   HDL 47 09/14/2022   LDLCALC 62 09/14/2022   TRIG 114 09/14/2022   CHOLHDL 2.8 09/14/2022  -She continues on Crestor 20 mg daily without side effects  3.  Adrenal adenoma -Labs normal -Reviewed previous adrenal pathology from her surgery with Dr. Celine Ahr: Benign adenomas.  This is likely a bilateral process for her. -In 2023, we checked aldosterone, cortisol, plasma metanephrines and catecholamines and they were normal.  Dexamethasone suppression test was also at goal -Adrenal hormone testing from 2011-2020 was reviewed and this was also normal -We will continue to keep an eye on this but no intervention is needed  for now  Philemon Kingdom, MD PhD [facility]

## 2022-10-22 ENCOUNTER — Other Ambulatory Visit: Payer: Self-pay | Admitting: Family Medicine

## 2022-10-22 MED ORDER — OMEPRAZOLE 40 MG PO CPDR: 40.0000 mg | DELAYED_RELEASE_CAPSULE | Freq: Every day | ORAL | 0 refills | Status: DC

## 2022-10-26 ENCOUNTER — Telehealth: Payer: Self-pay | Admitting: Family Medicine

## 2022-10-26 NOTE — Telephone Encounter (Signed)
Prescription Request  10/26/2022  Is this a "Controlled Substance" medicine? No  LOV: 09/14/2022  What is the name of the medication or equipment? LORazepam (ATIVAN) 1 MG tablet [[id number]   Have you contacted your pharmacy to request a refill? No   Which pharmacy would you like this sent to?    [id number] [place] [place]Phone: [phone] Fax: [phone] #: [id number]   Patient notified that their request is being sent to the clinical staff for review and that they should receive a response within 2 business days.   Please advise at Mobile [phone](mobile)

## 2022-10-26 NOTE — Telephone Encounter (Signed)
Next appt 12/14/22. Requests refill

## 2022-10-28 MED ORDER — LORAZEPAM 1 MG PO TABS: ORAL_TABLET | ORAL | 3 refills | Status: DC

## 2022-10-28 NOTE — Addendum Note (Signed)
Addended by: Fayrene Helper on: 10/28/2022 12:48 PM   Modules accepted: Orders

## 2022-10-29 ENCOUNTER — Telehealth: Payer: Self-pay | Admitting: Family Medicine

## 2022-10-29 ENCOUNTER — Other Ambulatory Visit: Payer: Self-pay

## 2022-10-29 DIAGNOSIS — E785 Hyperlipidemia, unspecified: Secondary | ICD-10-CM

## 2022-10-29 DIAGNOSIS — I270 Primary pulmonary hypertension: Secondary | ICD-10-CM

## 2022-10-29 MED ORDER — TRIAMTERENE-HCTZ 37.5-25 MG PO CAPS: 1.0000 | ORAL_CAPSULE | Freq: Every morning | ORAL | 1 refills | Status: DC

## 2022-10-29 NOTE — Telephone Encounter (Signed)
Wants to speak to nurse about med that was sent in to phar that qty was wrong on

## 2022-11-01 ENCOUNTER — Other Ambulatory Visit: Payer: Self-pay

## 2022-11-01 MED ORDER — OMEPRAZOLE 40 MG PO CPDR: 40.0000 mg | DELAYED_RELEASE_CAPSULE | Freq: Every day | ORAL | 5 refills | Status: DC

## 2022-11-01 NOTE — Telephone Encounter (Signed)
Refilled

## 2022-11-10 ENCOUNTER — Other Ambulatory Visit: Payer: Self-pay

## 2022-11-10 ENCOUNTER — Ambulatory Visit (HOSPITAL_COMMUNITY): Payer: BC Managed Care – PPO

## 2022-11-10 DIAGNOSIS — Z87891 Personal history of nicotine dependence: Secondary | ICD-10-CM

## 2022-11-10 DIAGNOSIS — Z122 Encounter for screening for malignant neoplasm of respiratory organs: Secondary | ICD-10-CM

## 2022-11-14 ENCOUNTER — Other Ambulatory Visit: Payer: Self-pay | Admitting: Family Medicine

## 2022-11-14 DIAGNOSIS — E7800 Pure hypercholesterolemia, unspecified: Secondary | ICD-10-CM

## 2022-12-02 ENCOUNTER — Encounter: Payer: Self-pay | Admitting: Radiology

## 2022-12-06 IMAGING — MG MM DIGITAL SCREENING BILAT W/ TOMO AND CAD
8 series · 8 of 24 positions shown · non-contrast
Comparison: Previous exam(s).

CLINICAL DATA: Screening.

EXAM:
DIGITAL SCREENING BILATERAL MAMMOGRAM WITH TOMO AND CAD

[L CC synth-2D]
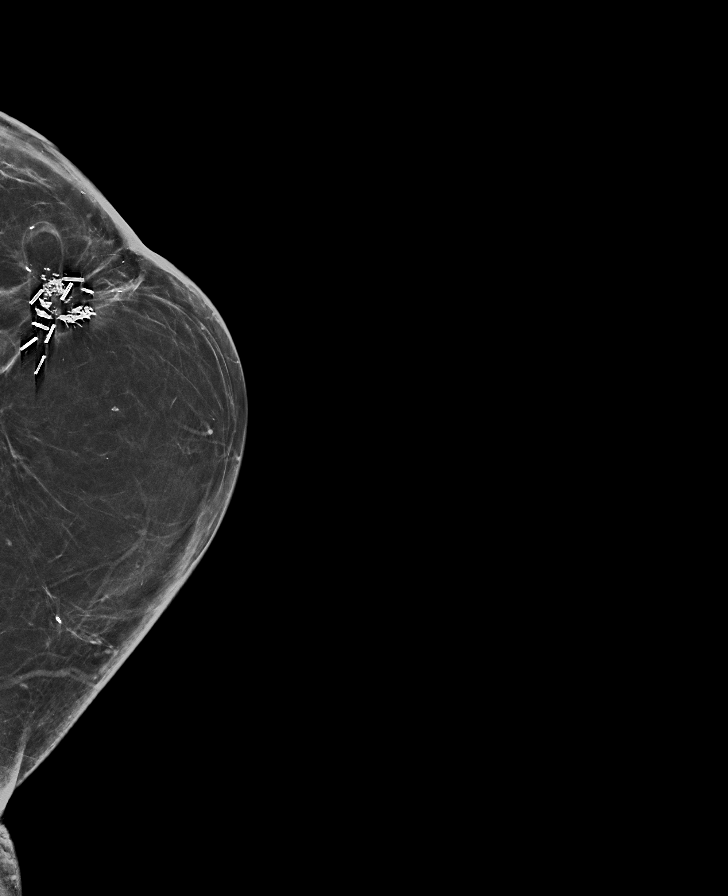

[R MLO synth-2D]
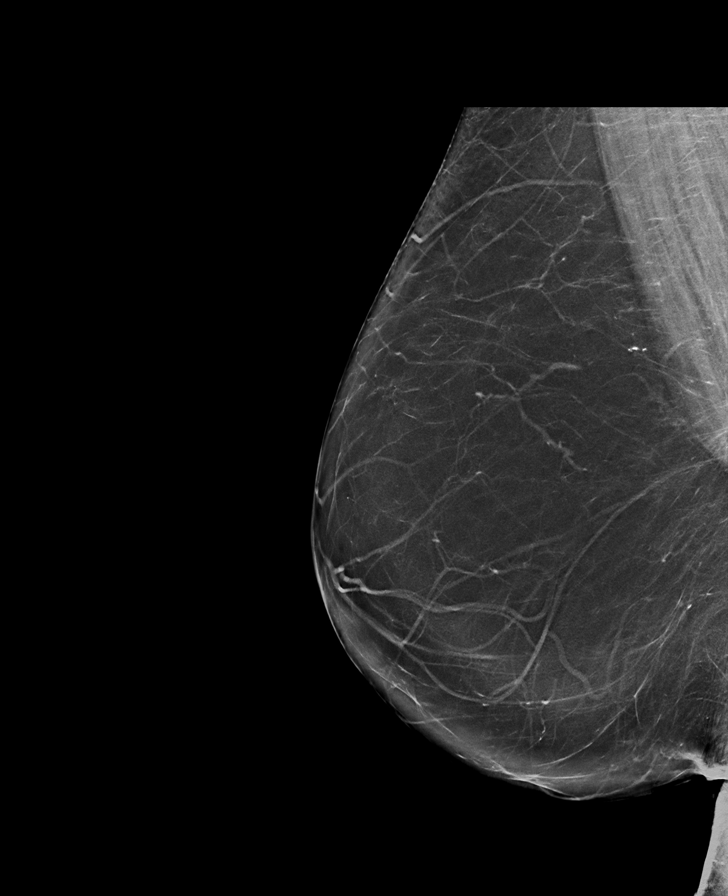

[L MLO synth-2D]
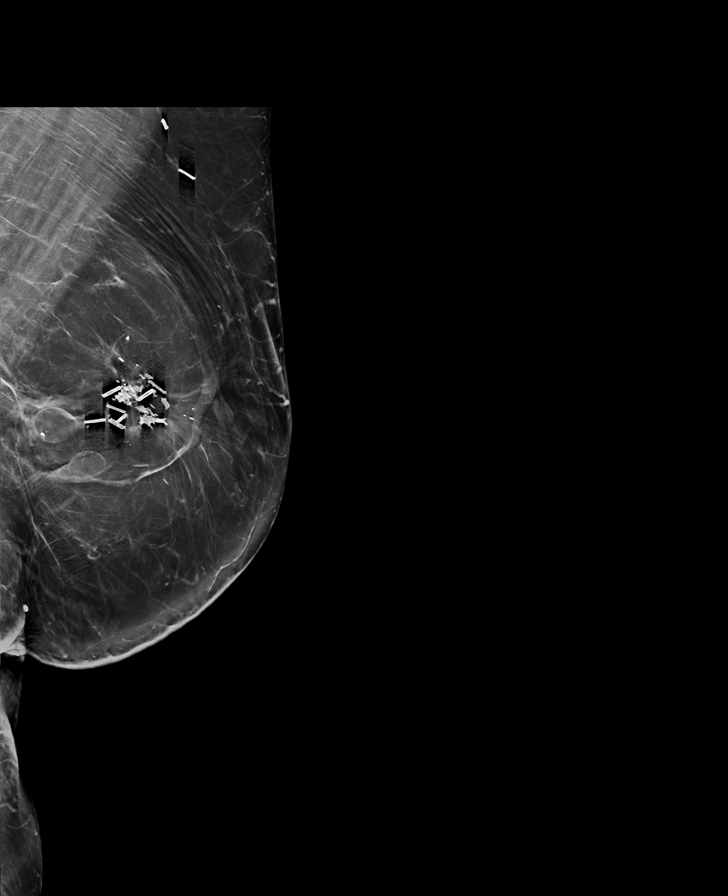

[R CC synth-2D]
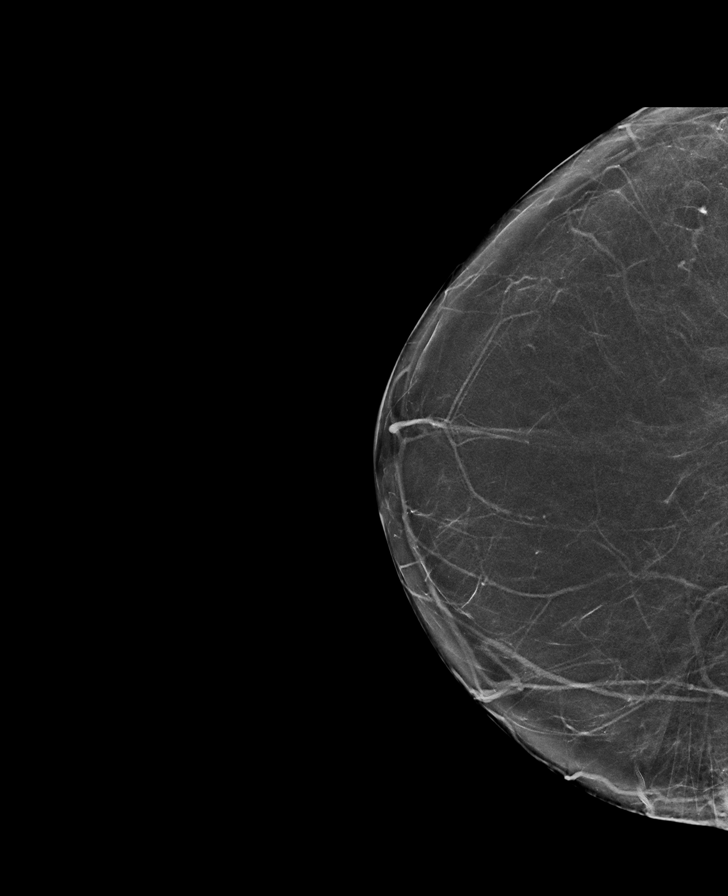

[R CC tomo · tomo slice 33/65.0]
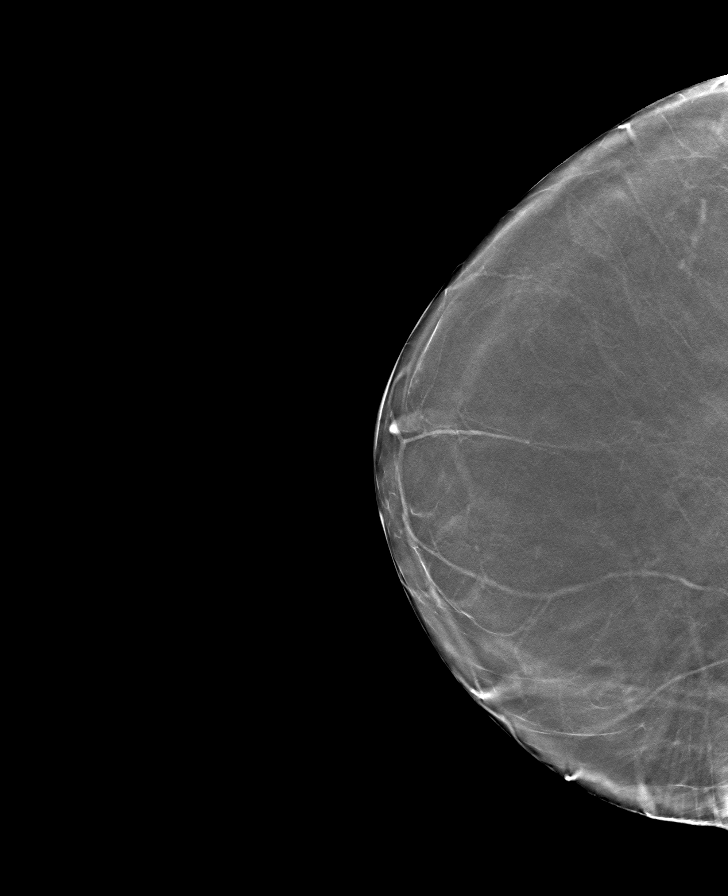

[R MLO tomo · tomo slice 38/75.0]
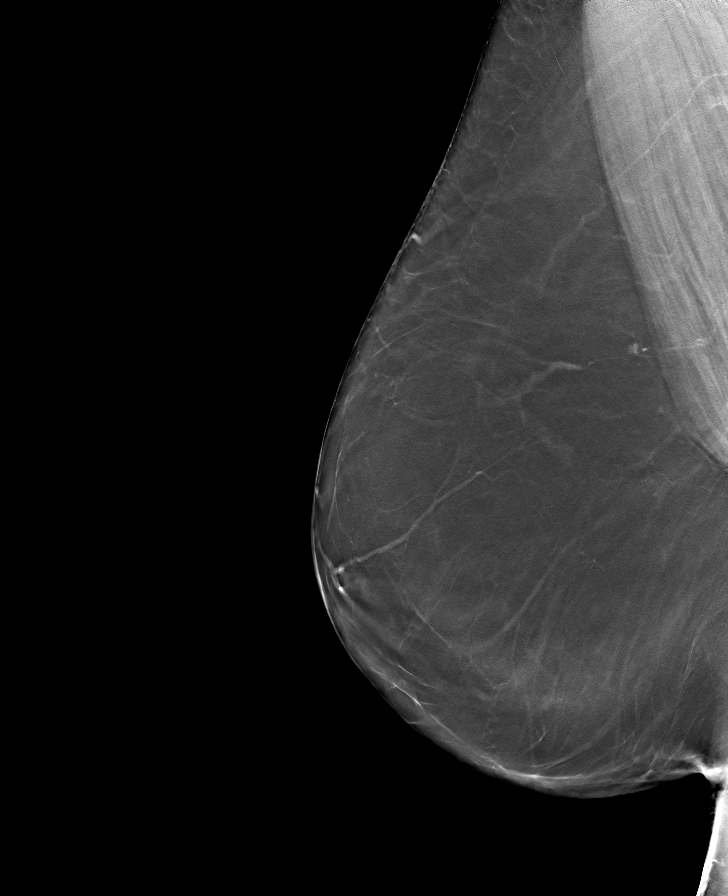

[L MLO tomo · tomo slice 39/78.0]
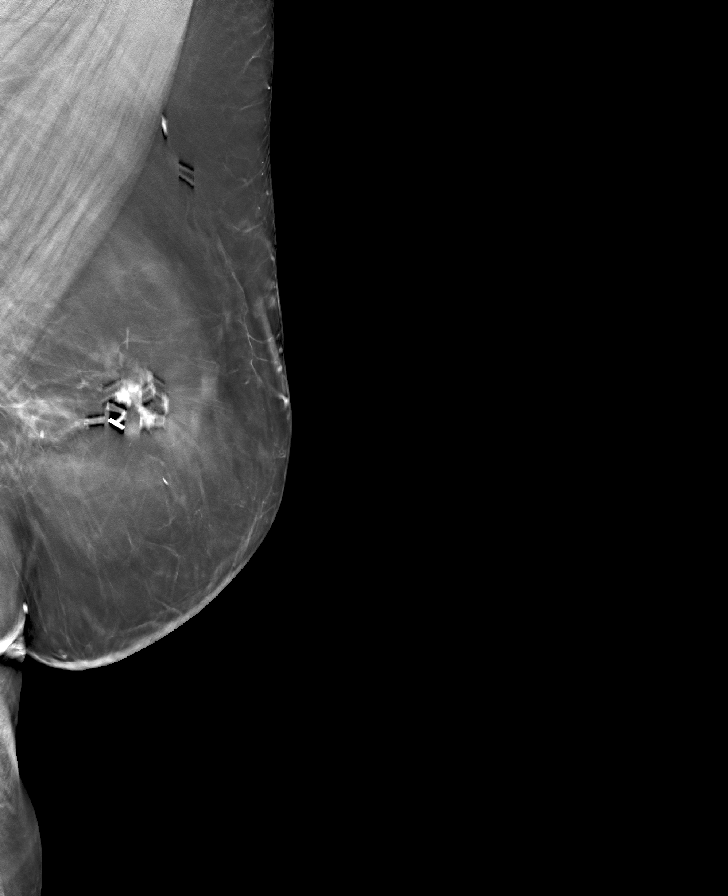

[L CC tomo · tomo slice 31/60.0]
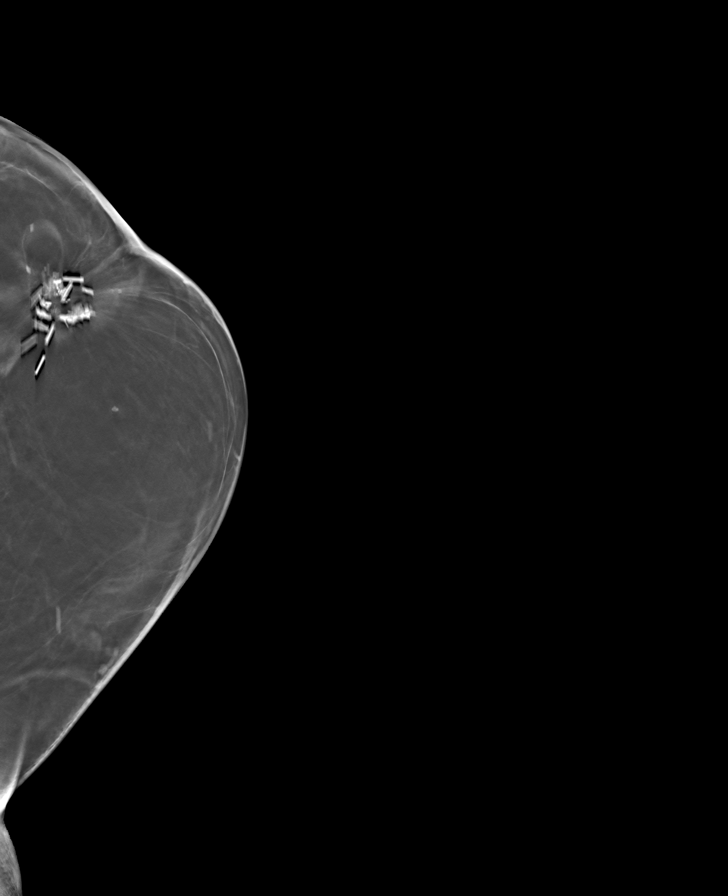

[8 of 24 positions shown; findings below may reference images not displayed]

ACR Breast Density Category b: There are scattered areas of
fibroglandular density.
FINDINGS: There are no findings suspicious for malignancy. The images were
evaluated with computer-aided detection.
IMPRESSION: No mammographic evidence of malignancy. A result letter of this
screening mammogram will be mailed directly to the patient.

RECOMMENDATION:
Screening mammogram in one year. (Code:ZP-[id number])

BI-RADS CATEGORY  1: Negative.

## 2022-12-14 ENCOUNTER — Encounter: Payer: Self-pay | Admitting: Family Medicine

## 2022-12-14 ENCOUNTER — Ambulatory Visit: Payer: BC Managed Care – PPO | Admitting: Family Medicine

## 2022-12-14 VITALS — BP 105/66 | HR 118 | Ht 66.0 in | Wt 191.1 lb

## 2022-12-14 DIAGNOSIS — E785 Hyperlipidemia, unspecified: Secondary | ICD-10-CM

## 2022-12-14 DIAGNOSIS — E559 Vitamin D deficiency, unspecified: Secondary | ICD-10-CM

## 2022-12-14 DIAGNOSIS — Z1231 Encounter for screening mammogram for malignant neoplasm of breast: Secondary | ICD-10-CM | POA: Diagnosis not present

## 2022-12-14 DIAGNOSIS — R300 Dysuria: Secondary | ICD-10-CM

## 2022-12-14 DIAGNOSIS — I270 Primary pulmonary hypertension: Secondary | ICD-10-CM

## 2022-12-14 DIAGNOSIS — I2584 Coronary atherosclerosis due to calcified coronary lesion: Secondary | ICD-10-CM

## 2022-12-14 DIAGNOSIS — F411 Generalized anxiety disorder: Secondary | ICD-10-CM

## 2022-12-14 DIAGNOSIS — E1169 Type 2 diabetes mellitus with other specified complication: Secondary | ICD-10-CM

## 2022-12-14 DIAGNOSIS — F17218 Nicotine dependence, cigarettes, with other nicotine-induced disorders: Secondary | ICD-10-CM

## 2022-12-14 DIAGNOSIS — F5104 Psychophysiologic insomnia: Secondary | ICD-10-CM

## 2022-12-14 DIAGNOSIS — I2510 Atherosclerotic heart disease of native coronary artery without angina pectoris: Secondary | ICD-10-CM

## 2022-12-14 DIAGNOSIS — Z794 Long term (current) use of insulin: Secondary | ICD-10-CM

## 2022-12-14 MED ORDER — TEMAZEPAM 15 MG PO CAPS: 15.0000 mg | ORAL_CAPSULE | Freq: Every evening | ORAL | 2 refills | Status: DC | PRN

## 2022-12-14 MED ORDER — LORAZEPAM 1 MG PO TABS: ORAL_TABLET | ORAL | 3 refills | Status: DC

## 2022-12-14 NOTE — Patient Instructions (Addendum)
F/U in 5 months, call if you need me sooner  PLEASE schedule your appointment with Pulmonary while here today at checkout  Please schedule mammogram at checkout  Please follow through on colonscopy   I will refer you to Cardiology  Nurse pls send for lung cancer screen test result and message will be sent to you   Fasting lipid , cmp and EGFr, cBC, TSH, CBC and vit D and microal/ur 3 to 5  days before next appt  Please work on cutting back on smoking  Thanks for choosing [facility], we consider it a privelige to serve you.

## 2022-12-15 LAB — HM DIABETES EYE EXAM

## 2022-12-16 ENCOUNTER — Encounter (HOSPITAL_COMMUNITY): Payer: Self-pay

## 2022-12-16 ENCOUNTER — Encounter: Payer: Self-pay | Admitting: Internal Medicine

## 2022-12-16 ENCOUNTER — Ambulatory Visit (HOSPITAL_COMMUNITY)
Admission: RE | Admit: 2022-12-16 | Discharge: 2022-12-16 | Disposition: A | Discharge status: Home / Self Care | Payer: BC Managed Care – PPO | Source: Ambulatory Visit | Attending: Family Medicine | Admitting: Family Medicine

## 2022-12-16 DIAGNOSIS — Z1231 Encounter for screening mammogram for malignant neoplasm of breast: Secondary | ICD-10-CM | POA: Diagnosis present

## 2022-12-20 ENCOUNTER — Encounter: Payer: Self-pay | Admitting: Family Medicine

## 2022-12-20 MED ORDER — TRAZODONE HCL 100 MG PO TABS: ORAL_TABLET | ORAL | 1 refills | Status: DC

## 2022-12-20 NOTE — Progress Notes (Signed)
Annette [name]     MRN: [id number]      DOB: [birth year]   HPI Annette [name] is here for follow up and re-evaluation of chronic medical conditions, medication management and review of any available recent lab and radiology data.  Preventive health is updated, specifically  Cancer screening and Immunization. Cancer screening is behind  Questions or concerns regarding consultations or procedures which the PT has had in the interim are  addressed.Still needs to sched appt re sleep apnea F/U wit Cardiology needed The PT denies any adverse reactions to current medications since the last visit.     ROS Denies recent fever or chills. Denies sinus pressure, nasal congestion, ear pain or sore throat. Chronci congestion and cough. Denies chest pains, palpitations and leg swelling Denies abdominal pain, nausea, vomiting,diarrhea or constipation.   Denies dysuria, frequency, hesitancy or incontinence. Denies joint pain, swelling and limitation in mobility. Denies headaches, seizures, numbness, or tingling. Denies uncontrolled  depression, anxiety or insomnia. Denies skin break down or rash.   PE  BP 105/66 (BP Location: Right Arm, Patient Position: Sitting, Cuff Size: Large)   Pulse (!) 118   Ht 5\' 6"  (1.676 m)   Wt 191 lb 1.3 oz (86.7 kg)   SpO2 93%   BMI 30.84 kg/m   Patient alert and oriented and in no cardiopulmonary distress.  HEENT: No facial asymmetry, EOMI,     Neck supple .  Chest: Clear to auscultation bilaterally.  CVS: S1, S2 no murmurs, no S3.Regular rate.  ABD: Soft non tender.   Ext: No edema  MS: Adequate ROM spine, shoulders, hips and knees.  Skin: Intact, no ulcerations or rash noted.  Psych: Good eye contact, normal affect. Memory intact not anxious or depressed appearing.  CNS: CN 2-12 intact, power,  normal throughout.no focal deficits noted.   Assessment & Plan  Type 2 diabetes mellitus with other specified complication ([place]) Annette [name] is  reminded of the importance of commitment to daily physical activity for 30 minutes or more, as able and the need to limit carbohydrate intake to 30 to 60 grams per meal to help with blood sugar control.   The need to take medication as prescribed, test blood sugar as directed, and to call between visits if there is a concern that blood sugar is uncontrolled is also discussed.   Annette [name] is reminded of the importance of daily foot exam, annual eye examination, and good blood sugar, blood pressure and cholesterol control.     Latest Ref Rng & Units 10/19/2022    2:43 PM 09/14/2022    4:16 PM 06/14/2022    3:10 PM 05/28/2022    8:00 PM 02/08/2022    2:53 PM  Diabetic Labs  HbA1c 4.0 - 5.6 % 7.4   7.1   6.9   Micro/Creat Ratio 0 - 29 mg/g creat  18      Chol 100 - 199 mg/dL  130      HDL >39 mg/dL  47      Calc LDL 0 - 99 mg/dL  62      Triglycerides 0 - 149 mg/dL  114      Creatinine 0.57 - 1.00 mg/dL  1.14   0.98        12/14/2022    3:33 PM 10/19/2022    2:34 PM 09/14/2022    2:52 PM 06/15/2022    3:22 PM 06/14/2022    2:37 PM 06/02/2022    2:51 PM 05/28/2022  11:22 PM  BP/Weight  Systolic BP 123456 0000000 86 84 123456 90 [id number]  Diastolic BP 66 78 56 48 76 64 61  Wt. (Lbs) 191.08 193.6 192.04 193.04 193 197.3   BMI 30.84 kg/m2 31.25 kg/m2 31 kg/m2 31.16 kg/m2 31.15 kg/m2 31.85 kg/m2       Latest Ref Rng & Units 12/15/2022   12:00 AM 10/19/2022    3:00 PM  Foot/eye exam completion dates  Eye Exam No Retinopathy No Retinopathy       Foot Form Completion   Done     This result is from an external source.      Controlled, no change in medication Managed by Endo  Nicotine dependence Asked:confirms currently smokes cigarettes Assess: Unwilling to set a quit date, but is cutting back Advise: needs to QUIT to reduce risk of cancer, cardio and cerebrovascular disease Assist: counseled for 5 minutes and literature provided Arrange: follow up in 2 to 4 months   Hyperlipidemia LDL goal  <100 Hyperlipidemia:Low fat diet discussed and encouraged.   Lipid Panel  Lab Results  Component Value Date   CHOL 130 09/14/2022   HDL 47 09/14/2022   LDLCALC 62 09/14/2022   TRIG 114 09/14/2022   CHOLHDL 2.8 09/14/2022     Controlled, no change in medication Updated lab needed at/ before next visit.   GAD (generalized anxiety disorder) Controlled, no change in medication   Insomnia Sleep hygiene reviewed and written information offered also. Prescription sent for  medication needed.

## 2022-12-20 NOTE — Assessment & Plan Note (Signed)
Annette [name] is reminded of the importance of commitment to daily physical activity for 30 minutes or more, as able and the need to limit carbohydrate intake to 30 to 60 grams per meal to help with blood sugar control.   The need to take medication as prescribed, test blood sugar as directed, and to call between visits if there is a concern that blood sugar is uncontrolled is also discussed.   Annette [name] is reminded of the importance of daily foot exam, annual eye examination, and good blood sugar, blood pressure and cholesterol control.     Latest Ref Rng & Units 10/19/2022    2:43 PM 09/14/2022    4:16 PM 06/14/2022    3:10 PM 05/28/2022    8:00 PM 02/08/2022    2:53 PM  Diabetic Labs  HbA1c 4.0 - 5.6 % 7.4   7.1   6.9   Micro/Creat Ratio 0 - 29 mg/g creat  18      Chol 100 - 199 mg/dL  130      HDL >39 mg/dL  47      Calc LDL 0 - 99 mg/dL  62      Triglycerides 0 - 149 mg/dL  114      Creatinine 0.57 - 1.00 mg/dL  1.14   0.98        12/14/2022    3:33 PM 10/19/2022    2:34 PM 09/14/2022    2:52 PM 06/15/2022    3:22 PM 06/14/2022    2:37 PM 06/02/2022    2:51 PM 05/28/2022   11:22 PM  BP/Weight  Systolic BP 123456 0000000 86 84 123456 90 [id number]  Diastolic BP 66 78 56 48 76 64 61  Wt. (Lbs) 191.08 193.6 192.04 193.04 193 197.3   BMI 30.84 kg/m2 31.25 kg/m2 31 kg/m2 31.16 kg/m2 31.15 kg/m2 31.85 kg/m2       Latest Ref Rng & Units 12/15/2022   12:00 AM 10/19/2022    3:00 PM  Foot/eye exam completion dates  Eye Exam No Retinopathy No Retinopathy       Foot Form Completion   Done     This result is from an external source.      Controlled, no change in medication Managed by Endo

## 2022-12-20 NOTE — Assessment & Plan Note (Signed)
Asked:confirms currently smokes cigarettes °Assess: Unwilling to set a quit date, but is cutting back °Advise: needs to QUIT to reduce risk of cancer, cardio and cerebrovascular disease °Assist: counseled for 5 minutes and literature provided °Arrange: follow up in 2 to 4 months ° °

## 2022-12-20 NOTE — Assessment & Plan Note (Signed)
Hyperlipidemia:Low fat diet discussed and encouraged.   Lipid Panel  Lab Results  Component Value Date   CHOL 130 09/14/2022   HDL 47 09/14/2022   LDLCALC 62 09/14/2022   TRIG 114 09/14/2022   CHOLHDL 2.8 09/14/2022     Controlled, no change in medication Updated lab needed at/ before next visit.

## 2022-12-20 NOTE — Assessment & Plan Note (Signed)
Sleep hygiene reviewed and written information offered also. Prescription sent for  medication needed.  

## 2022-12-20 NOTE — Assessment & Plan Note (Signed)
Controlled, no change in medication  

## 2023-01-15 ENCOUNTER — Other Ambulatory Visit: Payer: Self-pay | Admitting: Family Medicine

## 2023-01-15 DIAGNOSIS — F5104 Psychophysiologic insomnia: Secondary | ICD-10-CM

## 2023-02-09 ENCOUNTER — Ambulatory Visit: Payer: BC Managed Care – PPO | Admitting: Cardiovascular Disease

## 2023-02-10 ENCOUNTER — Encounter (INDEPENDENT_AMBULATORY_CARE_PROVIDER_SITE_OTHER): Payer: Self-pay | Admitting: *Deleted

## 2023-02-14 NOTE — Progress Notes (Unsigned)
Cardiology Office Note:   Date:  02/15/2023  NAME:  Annette [name]    MRN: [id number] DOB:  Mar 01, 1961   PCP:  Kerri Perches, MD  Cardiologist:  Reatha Harps, MD  Electrophysiologist:  None   Referring MD: Kerri Perches, MD   Chief Complaint  Patient presents with   Follow-up    History of Present Illness:   Annette [name] is a 62 y.o. female with a hx of DM, HTN, tobacco abuse who presents for follow-up. CCTA and echo normal in 2022.  She reports she still getting episodes of sharp left chest discomfort.  Also squeezing in her arm.  Symptoms occur with stress.  EKG in office today is normal.  Lipids at goal.  CV exam normal.  Overall seems to be stable.  Coronary CTA from 2022 is reassuring.  She is still smoking up to half pack per day.  We discussed smoking cessation.  This is important.  She is also working on her diabetes.  She still struggles with intermittent palpitations.  She takes metoprolol as needed.  Given her low blood pressure does not need to take this regularly.  Problem List Diabetes -A1c 7.4 HTN Tobacco abuse  -40 years  HLD -T chol 130, HDL 47, LDL 62, TG 114 5. IBS  Past Medical History: Past Medical History:  Diagnosis Date   Acute blood loss anemia 02/16/2012   Arthritis    [employer] 10/14/2020   Blood transfusion without reported diagnosis    [employer] 10/14/2020   Breast cancer (HCC) 2007   History of  XRT, onTamoxifen   Bruises easily    Cancer (HCC)    [facility]ia 10/14/2020   Depression    Depression    [employer] 10/14/2020   Diabetes mellitus    prediabetic   Diabetes mellitus without complication (HCC)    [employer]ia 10/14/2020   GERD (gastroesophageal reflux disease)    Hypertriglyceridemia    Hypothyroidism    following chemo amnd radiaition for breast cancer, needed replacement short term   Hypothyroidism (acquired)    replaced x 1 year   IBS (irritable bowel syndrome)    Menieres disease    Controlled with  triamterene    Migraines    Obesity    Palpitations    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HCC)    Sleep apnea 2010   Problems with CPAP    Past Surgical History: Past Surgical History:  Procedure Laterality Date   ABDOMINAL EXPOSURE N/A 12/03/2020   Procedure: ABDOMINAL EXPOSURE;  Surgeon: Larina Earthly, MD;  Location: [facility] OR;  Service: Vascular;  Laterality: N/A;   ABDOMINAL HYSTERECTOMY  1998   Benign, fibroids   ADRENALECTOMY  02/16/2012   [facility], splemic trauma, 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   ANTERIOR LUMBAR FUSION N/A 12/03/2020   Procedure: Lumbar Four-Five Lumbar Five Sacral One Anterior lumbar interbody fusion;  Surgeon: Coletta Memos, MD;  Location: [facility] OR;  Service: Neurosurgery;  Laterality: N/A;   APPENDECTOMY  06/12/08   BIOPSY  07/02/2020   Procedure: BIOPSY;  Surgeon: Dolores Frame, MD;  Location: [facility];  Service: Gastroenterology;;   BIOPSY  01/22/2021   Procedure: BIOPSY;  Surgeon: Corbin Ade, MD;  [facility]: [facility];  Service: Endoscopy;;   BIOPSY  02/17/2022   Procedure: BIOPSY;  Surgeon: Malissa Hippo, MD;  [facility];  Service: Endoscopy;;   BREAST LUMPECTOMY Left 2008   BREAST RECONSTRUCTION     Left reconstructive   BREAST SURGERY  2007   Left lumpectomy   BREAST SURGERY     Mammosite - right side   CHOLECYSTECTOMY  1985   COLONOSCOPY WITH PROPOFOL N/A 07/02/2020   Procedure: COLONOSCOPY WITH PROPOFOL;  Surgeon: Dolores Frame, MD;  [facility];  Service: Gastroenterology;  Laterality: N/A;  1045   COLONOSCOPY WITH PROPOFOL N/A 04/01/2021   Procedure: COLONOSCOPY WITH PROPOFOL;  Surgeon: Dolores Frame, MD;  [facility];  Service: Gastroenterology;  Laterality: N/A;  12:50   ESOPHAGEAL DILATION  12/19/2015   Procedure: ESOPHAGEAL DILATION;  Surgeon: Malissa Hippo, MD;  [facility];  Service: Endoscopy;;   ESOPHAGOGASTRODUODENOSCOPY N/A 12/19/2015   Procedure:  ESOPHAGOGASTRODUODENOSCOPY (EGD);  Surgeon: Malissa Hippo, MD;  [facility];  Service: Endoscopy;  Laterality: N/A;  11:40   ESOPHAGOGASTRODUODENOSCOPY (EGD) WITH PROPOFOL N/A 02/17/2022   Procedure: ESOPHAGOGASTRODUODENOSCOPY (EGD) WITH PROPOFOL;  Surgeon: Malissa Hippo, MD;  [facility];  Service: Endoscopy;  Laterality: N/A;  1045   FLEXIBLE SIGMOIDOSCOPY N/A 01/22/2021   Procedure: FLEXIBLE SIGMOIDOSCOPY;  Surgeon: Corbin Ade, MD;  [facility];  Service: Endoscopy;  Laterality: N/A;   HEMOSTASIS CLIP PLACEMENT  04/01/2021   Procedure: HEMOSTASIS CLIP PLACEMENT;  Surgeon: Dolores Frame, MD;  [facility];  Service: Gastroenterology;;  x2   POLYPECTOMY  07/02/2020   Procedure: POLYPECTOMY;  Surgeon: Dolores Frame, MD;  [facility];  Service: Gastroenterology;;   POLYPECTOMY  04/01/2021   Procedure: POLYPECTOMY INTESTINAL;  Surgeon: Marguerita Merles, Reuel Boom, MD;  [facility];  Service: Gastroenterology;;   SPLENECTOMY, 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   Dulaglutide (TRULICITY) 1.5 MG/0.5ML SOPN INJECT 1.5MG  SUBCUTANEOUSLY ONCE A WEEK   fluticasone (FLONASE) 50 MCG/ACT nasal spray Place 2 sprays into both nostrils daily.   glucose blood (ONETOUCH VERIO) test strip USE AS DIRECTED 2 TIMES DAILY   glucose blood test strip 200 each by Other route See admin instructions.   insulin degludec (TRESIBA FLEXTOUCH) 200 UNIT/ML FlexTouch Pen Inject 26 Units into the skin at bedtime.   Insulin Pen Needle (BD PEN NEEDLE NANO 2ND GEN) 32G X 4 MM MISC USE 4X DAILY AS DIRECTED   Lancets (ONETOUCH DELICA PLUS LANCET33G) MISC USE TO TEST 4 TIMES A DAY   LORazepam (ATIVAN) 1 MG tablet Take  one tablet by mouth once daily, as needed, for uncontrolled nausea   omeprazole (PRILOSEC) 40 MG capsule Take 1 capsule (40 mg total) by mouth daily.   rosuvastatin (CRESTOR) 20 MG tablet TAKE 1 TABLET DAILY   temazepam (RESTORIL) 15 MG capsule Take 1 capsule (15 mg total) by mouth at bedtime as needed for sleep.   traZODone (DESYREL) 100 MG tablet TAKE 1 TABLET AT BEDTIME   triamterene-hydrochlorothiazide (DYAZIDE) 37.5-25 MG capsule Take 1 each (1 capsule total) by mouth every morning.   venlafaxine XR (EFFEXOR-XR) 75 MG 24 hr capsule TAKE 1 CAPSULE DAILY     Allergies:    Penicillins, Metformin and related, Wound dressing adhesive, Jardiance [empagliflozin], Statins, and Zetia [ezetimibe]   Social History: Social History   Socioeconomic History   Marital status: Married    Spouse name: Clovis Riley   Number of children:  2   Years of education: Not on file   Highest education level: Not on file  Occupational History   Not on file  Tobacco Use   Smoking status: Every Day    Packs/day: 0.50    Years: 40.00    Additional pack years: 0.00    Total pack years: 20.00    Types: Cigarettes   Smokeless tobacco: Never   Tobacco comments:    1/2 pack a day    02/15/2023 Patient smokes about 1/2 pack daily  Vaping Use   Vaping Use: Never used  Substance and Sexual Activity   Alcohol use: No    Alcohol/week: 0.0 standard drinks of alcohol    Comment: Encouraged to quit smoking. She has tried the nicotone gum and patches but did not help   Drug use: No   Sexual activity: Yes    Birth control/protection: Surgical  Other Topics Concern   Not on file  Social History Narrative   Not on file   Social Determinants of Health   Financial Resource Strain: Medium Risk (11/11/2020)   Overall Financial Resource Strain (CARDIA)    Difficulty of Paying Living Expenses: Somewhat hard  Food Insecurity: No Food Insecurity (11/11/2020)   Hunger Vital Sign    Worried About Running Out of Food in the  Last Year: Never true    Ran Out of Food in the Last Year: Never true  Transportation Needs: No Transportation Needs (11/11/2020)   PRAPARE - Administrator, Civil Service (Medical): No    Lack of Transportation (Non-Medical): No  Physical Activity: Inactive (11/11/2020)   Exercise Vital Sign    Days of Exercise per Week: 0 days    Minutes of Exercise per Session: 0 min  Stress: Stress Concern Present (11/11/2020)   [employer] of Occupational Health - Occupational Stress Questionnaire    Feeling of Stress : To some extent  Social Connections: Moderately Integrated (11/11/2020)   Social Connection and Isolation Panel [NHANES]    Frequency of Communication with Friends and Family: Three times a week    Frequency of Social Gatherings with Friends and Family: Three times a week    Attends Religious Services: More than 4 times per year    Active Member of Clubs or Organizations: No    Attends Banker Meetings: Never    Marital Status: Married     Family History: The patient's family history includes Arrhythmia in her father and mother; Cancer in her paternal grandmother; Diabetes in her brother, mother, and sister; Heart disease in her brother and father; Hyperlipidemia in her father.  ROS:   All other ROS reviewed and negative. Pertinent positives noted in the HPI.     EKGs/Labs/Other Studies Reviewed:   The following studies were personally reviewed by me today:  CCTA 09/16/2021 IMPRESSION: 1. No evidence of CAD, CADRADS = 0.   2. Coronary calcium score of 0. This was 0 percentile for age and sex matched control.   3. Normal coronary origin with right dominance.   4. Consider non-coronary causes of chest pain.  Recent Labs: 06/02/2022: Hemoglobin 14.5; Platelets 388 09/14/2022: ALT 18; BUN 15; Creatinine, Ser 1.14; Potassium 4.3; Sodium 142   Recent Lipid Panel    Component Value Date/Time   CHOL 130 09/14/2022 1616   TRIG 114 09/14/2022 1616    HDL 47 09/14/2022 1616   CHOLHDL 2.8 09/14/2022 1616   CHOLHDL 5.0 11/15/2014 1119   VLDL 25 11/15/2014 1119  LDLCALC 62 09/14/2022 1616    Physical Exam:   VS:  BP 94/62 (BP Location: Left Arm, Patient Position: Sitting, Cuff Size: Normal)   Pulse 63   Ht 5\' 6"  (1.676 m)   Wt 191 lb 9.6 oz (86.9 kg)   SpO2 95%   BMI 30.93 kg/m    Wt Readings from Last 3 Encounters:  02/15/23 191 lb 9.6 oz (86.9 kg)  12/14/22 191 lb 1.3 oz (86.7 kg)  10/19/22 193 lb 9.6 oz (87.8 kg)    General: Well nourished, well developed, in no acute distress Head: Atraumatic, normal size  Eyes: PEERLA, EOMI  Neck: Supple, no JVD Endocrine: No thryomegaly Cardiac: Normal S1, S2; RRR; no murmurs, rubs, or gallops Lungs: Clear to auscultation bilaterally, no wheezing, rhonchi or rales  Abd: Soft, nontender, no hepatomegaly  Ext: No edema, pulses 2+ Musculoskeletal: No deformities, BUE and BLE strength normal and equal Skin: Warm and dry, no rashes   Neuro: Alert and oriented to person, place, time, and situation, CNII-XII grossly intact, no focal deficits  Psych: Normal mood and affect   ASSESSMENT:   Annette [name] is a 62 y.o. female who presents for the following: 1. Precordial pain   2. Mixed hyperlipidemia   3. Tobacco abuse     PLAN:   1. Precordial pain -Suspect ongoing noncardiac chest discomfort.  Coronary CTA negative in 2022.  EKG normal.  Continues to experience chest discomfort and left arm pain with stress.  We discussed stress reduction strategies.  All of her other risk factors are well-controlled.  She has a coronary calcium of 0.  She can stop aspirin.  2. Mixed hyperlipidemia -She is on a statin.  She is diabetic.  LDL at goal.  3. Tobacco abuse -Still smoking half pack per day.  3 minutes of smoking cessation counseling was provided in office today.      Disposition: Return in about 3 months (around 05/18/2023).  Medication Adjustments/Labs and Tests Ordered: Current  medicines are reviewed at length with the patient today.  Concerns regarding medicines are outlined above.  No orders of the defined types were placed in this encounter.  No orders of the defined types were placed in this encounter.   Patient Instructions  Medication Instructions:  The current medical regimen is effective;  continue present plan and medications.  *If you need a refill on your cardiac medications before your next appointment, please call your pharmacy*   Follow-Up: At [facility], you and your health needs are our priority.  As part of our continuing mission to provide you with exceptional heart care, we have created designated Provider Care Teams.  These Care Teams include your primary Cardiologist (physician) and Advanced Practice Providers (APPs -  Physician Assistants and Nurse Practitioners) who all work together to provide you with the care you need, when you need it.  We recommend signing up for the patient portal called "MyChart".  Sign up information is provided on this After Visit Summary.  MyChart is used to connect with patients for Virtual Visits (Telemedicine).  Patients are able to view lab/test results, encounter notes, upcoming appointments, etc.  Non-urgent messages can be sent to your provider as well.   To learn more about what you can do with MyChart, go to [email or web address].    Your next appointment:   12 month(s)  Provider:   Reatha Harps, MD        Time Spent with Patient: I have spent a total  of 25 minutes with patient reviewing hospital notes, telemetry, EKGs, labs and examining the patient as well as establishing an assessment and plan that was discussed with the patient.  > 50% of time was spent in direct patient care.  Signed, Lenna Gilford. Flora Lipps, MD, [employer]  [facility]  [address] [place], [place] [address] [phone]  02/15/2023 4:52 PM

## 2023-02-15 ENCOUNTER — Encounter: Payer: Self-pay | Admitting: Cardiovascular Disease

## 2023-02-15 ENCOUNTER — Ambulatory Visit: Payer: BC Managed Care – PPO | Attending: Cardiovascular Disease | Admitting: Cardiovascular Disease

## 2023-02-15 VITALS — BP 94/62 | HR 63 | Ht 66.0 in | Wt 191.6 lb

## 2023-02-15 DIAGNOSIS — Z720 Tobacco use: Secondary | ICD-10-CM

## 2023-02-15 DIAGNOSIS — R072 Precordial pain: Secondary | ICD-10-CM | POA: Diagnosis not present

## 2023-02-15 DIAGNOSIS — E782 Mixed hyperlipidemia: Secondary | ICD-10-CM | POA: Diagnosis not present

## 2023-02-15 NOTE — Patient Instructions (Signed)
Medication Instructions:  The current medical regimen is effective;  continue present plan and medications.  *If you need a refill on your cardiac medications before your next appointment, please call your pharmacy*   Follow-Up: At [facility], you and your health needs are our priority.  As part of our continuing mission to provide you with exceptional heart care, we have created designated Provider Care Teams.  These Care Teams include your primary Cardiologist (physician) and Advanced Practice Providers (APPs -  Physician Assistants and Nurse Practitioners) who all work together to provide you with the care you need, when you need it.  We recommend signing up for the patient portal called "MyChart".  Sign up information is provided on this After Visit Summary.  MyChart is used to connect with patients for Virtual Visits (Telemedicine).  Patients are able to view lab/test results, encounter notes, upcoming appointments, etc.  Non-urgent messages can be sent to your provider as well.   To learn more about what you can do with MyChart, go to [email or web address].    Your next appointment:   12 month(s)  Provider:   Dearing T O'Neal, MD   

## 2023-02-17 ENCOUNTER — Ambulatory Visit: Payer: BC Managed Care – PPO | Admitting: Internal Medicine

## 2023-02-17 ENCOUNTER — Encounter: Payer: Self-pay | Admitting: Internal Medicine

## 2023-02-17 VITALS — BP 100/60 | HR 104 | Ht 66.0 in | Wt 191.2 lb

## 2023-02-17 DIAGNOSIS — E782 Mixed hyperlipidemia: Secondary | ICD-10-CM

## 2023-02-17 DIAGNOSIS — Z7985 Long-term (current) use of injectable non-insulin antidiabetic drugs: Secondary | ICD-10-CM

## 2023-02-17 DIAGNOSIS — E1165 Type 2 diabetes mellitus with hyperglycemia: Secondary | ICD-10-CM

## 2023-02-17 DIAGNOSIS — E278 Other specified disorders of adrenal gland: Secondary | ICD-10-CM

## 2023-02-17 DIAGNOSIS — Z794 Long term (current) use of insulin: Secondary | ICD-10-CM | POA: Diagnosis not present

## 2023-02-17 DIAGNOSIS — E669 Obesity, unspecified: Secondary | ICD-10-CM

## 2023-02-17 DIAGNOSIS — E119 Type 2 diabetes mellitus without complications: Secondary | ICD-10-CM

## 2023-02-17 LAB — POCT GLYCOSYLATED HEMOGLOBIN (HGB A1C): Hemoglobin A1C: 6.8 % — AB (ref 4.0–5.6)

## 2023-02-17 MED ORDER — FREESTYLE LIBRE 3 READER DEVI: 1.0000 | Freq: Once | 0 refills | Status: AC

## 2023-02-17 MED ORDER — FREESTYLE LIBRE 3 SENSOR MISC: 1.0000 | 3 refills | Status: DC

## 2023-02-17 NOTE — Patient Instructions (Addendum)
Please continue: - Tresiba 26 units daily - Trulicity 1.5 mg weekly  Please return in 4 months with your sugar log.

## 2023-02-17 NOTE — Progress Notes (Signed)
Patient ID: [name], female   DOB: Oct 04, 1961, 62 y.o.   MRN: [id number]   HPI: Annette [name] is a 62 y.o.-year-old female, initially referred by her PCP, Dr. Lodema [name], returning for follow-up for DM2, dx in 2018, insulin-dependent since 03/2019, uncontrolled, without long-term complications.  She previously saw Dr. Fransico [name] and Dr. Talmage [name] but then switched to seeing me.  Last visit 4 months ago.  He is here with her husband who offers part of the history.  Interim history: She continues to have some nausea. She has blurry vision, but no increased urination, chest pain.  Reviewed HbA1c levels: Lab Results  Component Value Date   HGBA1C 7.4 (A) 10/19/2022   HGBA1C 7.1 (A) 06/14/2022   HGBA1C 6.9 (A) 02/08/2022   HGBA1C 6.7 (H) 01/26/2022   HGBA1C 5.9 (A) 05/19/2021   HGBA1C 6.0 (H) 01/17/2021   HGBA1C 6.4 (H) 12/03/2020   HGBA1C 6.6 (A) 09/29/2020   HGBA1C 7.0 (A) 06/24/2020   HGBA1C 9.4 (A) 03/11/2020  06/14/2022: HbA1c calculated from fructosamine normal, at 6.27%, correlating better with blood sugar checks at home.  Previously on: - Glipizide XL 20 mg before breakfast - Lantus 50 units at bedtime (dose increased 04/08/2020) She could not tolerate Metformin due to GI intolerance - abdominal pain and diarrhea. She had vaginal yeast infections and UTIs from [place]. Previously on Tresiba, but unclear why changed to Lantus.  Currently on: - Trulicity 0.75 >> 1.5 mg weekly - started 04/2020 -tolerated well - Tresiba 50 units at bedtime  >> Toujeo 50 >> 56 units at bedtime >> Tresiba 50 >> 30-46 >> 24 >> 24-30 >> 26 units daily We stopped glipizide XL 01/2021.  Pt checks her sugars twice a day: - am: 77, 94, 102-135 >> 93-134, 144 >> 130s-155 >> 96-146 - 2h after b'fast: n/c - before lunch: n/c >> 120 >> n/c - 2h after lunch: n/c >> 64 >> n/c - before dinner: 62, 65, 70-176, 199 >> 84-139 >> 90s-120s >> n/c - 2h after dinner:  106-166 >> 125-150 >> n/c >> 83-145, 154 -  bedtime: n/c >> 94 >> n/c - nighttime: n/c Lowest sugar was 43 (am) >> 67 >> 62 >> 68 >> 83; she has hypoglycemia awareness in the 90s.  Highest sugar was 425 ... >> 176 >> 240s  Glucometer: One Touch verio  She is again only eating 1 meal a day per husband's report She only drinks unsweetened tea and coffee. She eats a Healthy Choice for lunch and occasionally dinner.  -She has no history of CKD, last BUN/creatinine:  Lab Results  Component Value Date   BUN 15 09/14/2022   BUN 14 05/28/2022   CREATININE 1.14 (H) 09/14/2022   CREATININE 0.98 05/28/2022  Not on ACE inhibitor/ARB.  + HL; last set of lipids: Lab Results  Component Value Date   CHOL 130 09/14/2022   HDL 47 09/14/2022   LDLCALC 62 09/14/2022   TRIG 114 09/14/2022   CHOLHDL 2.8 09/14/2022  On Crestor 20. She was also on Zetia but she did not feel well on it >> stopped.  - last eye exam was in 01/2023: No DR reportedly.  At last visit she described occasional double vision.  I referred her to [facility].  - no numbness but some pain and tingling in her right foot -after her back surgery.   Pt has FH of DM in mother, brother, sister.  She also has a history of adrenal adenomas: - she had previous  adrenalectomy by Dr. 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   Received labeled "left adrenal gland" is a 30 g adrenal gland  which is received transected by the surgeon which, when  reapproximated has an overall measurement of 8.5 x 4.5 x 2.8 cm.  The cut surface reveals a central well-circumscribed, yellow  cortical nodule measuring 4 x 3.5 x 2.8 cm. The nodule is focally  compressing the adrenal gland. Within the surrounding soft tissue  there is a 0.3 cm yellow nodule which has a similar appearance to  the cortical nodule representative sections are submitted as  follows:    -  she was found to have 2 adrenal adenomas, which is investigated by oncology.  They had negative Hounsfield units on CT, consistent with benign nodules.  Hormonal investigation was negative: Component     Latest Ref Rng 09/07/2021 09/09/2021 11/02/2021  Epinephrine, Rand Ur     Undefined ug/L  1    Epinephrine, U, 24Hr     0 - 20 ug/24 hr  2    Norepinephrine, Rand Ur     Undefined ug/L  19    Norepinephrine,U,24H     0 - 135 ug/24 hr  29    Dopamine, Rand Ur     Undefined ug/L  104    Dopamine, Ur, 24Hr     0 - 510 ug/24 hr  156    Total Volume  1,500    Total Volume  1,500    Normetanephrine, Ur     Undefined ug/L  142    Normetanephr.,U,24h     131 - 612 ug/24 hr  213    Metanephrine, Ur     Undefined ug/L  26    Metanephrines, 24H Ur     36 - 209 ug/24 hr  39    Norepinephrine     0 - 874 pg/mL 745     Epinephrine     0 - 62 pg/mL 19     Dopamine     0 - 48 pg/mL 40     ALDOSTERONE      ng/dL   27   Renin Activity     0.25 - 5.82 ng/mL/h   13.41 (H)   ALDO / PRA Ratio     0.9 - 28.9 Ratio   2.0   Normetanephrine, Pl     0.0 - 285.2 pg/mL 106.9     Metanephrine, Pl     0.0 - 88.0 pg/mL 10.9     C206 ACTH     6 - 50 pg/mL 4.2 (L)   23   Cortisol, Plasma     ug/dL 6.6   21.3   Cortisol, Plasma        37.2   Cortisol, Plasma        24.4   Potassium     3.5 - 5.1 mEq/L   3.8    Dexamethasone suppression test was normal: Component     Latest Ref Rng 12/03/2021  Dexamethasone, Serum     ng/dL 086   Cortisol - AM     mcg/dL 2.6 (L)     Previous investigation was also negative: Component     Latest Ref Rng 08/17/2011  Total Volume - CF 24Hr U     mL 1300   Epinephrine, 24 hr Urine  2 - 24 mcg/24 h 4   Norepinephrine, 24 hr Ur     15 - 100 mcg/24 h 39   Calculated Total (E+NE)     26 - 121 mcg/24 h 43   Dopamine, 24 hr Urine     52 - 480 mcg/24 h 139   Creatinine, Urine mg/day-CATEUR     0.63 - 2.50 g/24 h 1.34   Normetanephrine, Ur      Undefined ug/L   Normetanephr.,U,24h     131 - 612 ug/24 hr 318   Metanephrine, Ur     Undefined ug/L 422   Metanephrines, 24H Ur     36 - 209 ug/24 hr   Metanephrines, Ur     90 - 315 mcg/24 h 104    Component     Latest Ref Rng 08/17/2011  Cortisol, Plasma     ug/dL 16.1   Aldosterone, Serum      ng/dL 23    09/60/4540: Component Ref Range & Units 10 yr ago  Total Volume - CF 24Hr U mL 1300   Epinephrine, 24 hr Urine 2 - 24 mcg/24 h 4   Norepinephrine, 24 hr Ur 15 - 100 mcg/24 h 39   Calculated Total (E+NE) 26 - 121 mcg/24 h 43   Dopamine, 24 hr Urine 52 - 480 mcg/24 h 139   Creatinine, Urine mg/day-CATEUR 0.63 - 2.50 g/24 h 1.34   Resulting Agency  [facility]   Component Ref Range & Units 10 yr ago  Metanephrines, Ur 90 - 315 mcg/24 h 104   Comment: This specimen was submitted with a pH greater than 6.0. Optimum pH for this assay is 1.0-6.0. Improper preservation may compromise the validity of the assay.  Normetanephrine, 24H Ur 122 - 676 mcg/24 h 318   Metaneph Total, Ur 224 - 832 mcg/24 h 422   Comment: A four fold elevation of urinary normetanephrines is extremely likely to be due to a tumor, while a four fold elevation of urinary metanephrines is highly suggestive, but not diagnostic of the tumor. Measurement of plasma Metanephrines and Chromogranin A is recommended for confirmation.  Resulting Agency  [facility]   12/22/2009: Cortisol,F,ug/L,U Undefined ug/L 20   Cortisol, 24H Ur 0-50 ug/24 hr 50    She also has a history of HTN, OSA. She had COVID-19 10/2019. She recovered well afterwards.  She also has a history of partial thyroidectomy. Reviewed latest TSH: Lab Results  Component Value Date   TSH 1.830 01/26/2022   She had back surgery (12/2020).  She developed significant leg pain afterwards which slowly improved.  ROS: + See HPI  I reviewed pt's medications, allergies, PMH, social hx, family hx, and changes were documented in the history of present  illness. Otherwise, unchanged from my initial visit note.  Past Medical History:  Diagnosis Date   Acute blood loss anemia 02/16/2012   Arthritis    Phreesia 10/14/2020   Blood transfusion without reported diagnosis    Phreesia 10/14/2020   Breast cancer (HCC) 2007   History of  XRT, onTamoxifen   Bruises easily    Cancer (HCC)    Phreesia 10/14/2020   Depression    Depression    Phreesia 10/14/2020   Diabetes mellitus    prediabetic   Diabetes mellitus without complication (HCC)    Phreesia 10/14/2020   GERD (gastroesophageal reflux disease)    Hypertriglyceridemia    Hypothyroidism    following chemo amnd radiaition for breast cancer, needed replacement short term  Hypothyroidism (acquired)    replaced x 1 year   IBS (irritable bowel syndrome)    Menieres disease    Controlled with triamterene    Migraines    Obesity    Palpitations    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HCC)    Sleep apnea 2010   Problems with CPAP   Past Surgical History:  Procedure Laterality Date   ABDOMINAL EXPOSURE N/A 12/03/2020   Procedure: ABDOMINAL EXPOSURE;  Surgeon: Larina Earthly, MD;  Location: [facility] OR;  Service: Vascular;  Laterality: N/A;   ABDOMINAL HYSTERECTOMY  1998   Benign, fibroids   ADRENALECTOMY  02/16/2012   [facility], splemic trauma, 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   ANTERIOR LUMBAR FUSION N/A 12/03/2020   Procedure: Lumbar Four-Five Lumbar Five Sacral One Anterior lumbar interbody fusion;  Surgeon: Coletta Memos, MD;  Location: [facility] OR;  Service: Neurosurgery;  Laterality: N/A;   APPENDECTOMY  06/12/08   BIOPSY  07/02/2020   Procedure: BIOPSY;  Surgeon: Dolores Frame, MD;  Location: [facility];  Service: Gastroenterology;;   BIOPSY  01/22/2021   Procedure: BIOPSY;  Surgeon: Corbin Ade, MD;  [facility]: [facility];  Service: Endoscopy;;   BIOPSY  02/17/2022   Procedure: BIOPSY;  Surgeon: Malissa Hippo, MD;  [facility]: [facility];  Service: Endoscopy;;   BREAST LUMPECTOMY Left  2008   BREAST RECONSTRUCTION     Left reconstructive   BREAST SURGERY  2007   Left lumpectomy   BREAST SURGERY     Mammosite - right side   CHOLECYSTECTOMY  1985   COLONOSCOPY WITH PROPOFOL N/A 07/02/2020   Procedure: COLONOSCOPY WITH PROPOFOL;  Surgeon: Dolores Frame, MD;  [facility]: [facility];  Service: Gastroenterology;  Laterality: N/A;  1045   COLONOSCOPY WITH PROPOFOL N/A 04/01/2021   Procedure: COLONOSCOPY WITH PROPOFOL;  Surgeon: Dolores Frame, MD;  [facility];  Service: Gastroenterology;  Laterality: N/A;  12:50   ESOPHAGEAL DILATION  12/19/2015   Procedure: ESOPHAGEAL DILATION;  Surgeon: Malissa Hippo, MD;  [facility]: [facility];  Service: Endoscopy;;   ESOPHAGOGASTRODUODENOSCOPY N/A 12/19/2015   Procedure: ESOPHAGOGASTRODUODENOSCOPY (EGD);  Surgeon: Malissa Hippo, MD;  [facility];  Service: Endoscopy;  Laterality: N/A;  11:40   ESOPHAGOGASTRODUODENOSCOPY (EGD) WITH PROPOFOL N/A 02/17/2022   Procedure: ESOPHAGOGASTRODUODENOSCOPY (EGD) WITH PROPOFOL;  Surgeon: Malissa Hippo, MD;  [facility];  Service: Endoscopy;  Laterality: N/A;  1045   FLEXIBLE SIGMOIDOSCOPY N/A 01/22/2021   Procedure: FLEXIBLE SIGMOIDOSCOPY;  Surgeon: Corbin Ade, MD;  [facility];  Service: Endoscopy;  Laterality: N/A;   HEMOSTASIS CLIP PLACEMENT  04/01/2021   Procedure: HEMOSTASIS CLIP PLACEMENT;  Surgeon: Dolores Frame, MD;  [facility];  Service: Gastroenterology;;  x2   POLYPECTOMY  07/02/2020   Procedure: POLYPECTOMY;  Surgeon: Dolores Frame, MD;  [facility];  Service: Gastroenterology;;   POLYPECTOMY  04/01/2021   Procedure: POLYPECTOMY INTESTINAL;  Surgeon: Dolores Frame, MD;  [facility];  Service: Gastroenterology;;   SPLENECTOMY, 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   THYROIDECTOMY, PARTIAL  11/05/2010   Benign  disease   Social History   Socioeconomic History   Marital status: Married    Spouse name: Clovis Riley   Number of children: 2   Years of education: Not on file   Highest education level: Not on file  Occupational History   Not on file  Tobacco  Use   Smoking status: Every Day    Packs/day: 0.50    Years: 40.00    Additional pack years: 0.00    Total pack years: 20.00    Types: Cigarettes   Smokeless tobacco: Never   Tobacco comments:    1/2 pack a day    02/15/2023 Patient smokes about 1/2 pack daily  Vaping Use   Vaping Use: Never used  Substance and Sexual Activity   Alcohol use: No    Alcohol/week: 0.0 standard drinks of alcohol    Comment: Encouraged to quit smoking. She has tried the nicotone gum and patches but did not help   Drug use: No   Sexual activity: Yes    Birth control/protection: Surgical  Other Topics Concern   Not on file  Social History Narrative   Not on file   Social Determinants of Health   Financial Resource Strain: Medium Risk (11/11/2020)   Overall Financial Resource Strain (CARDIA)    Difficulty of Paying Living Expenses: Somewhat hard  Food Insecurity: No Food Insecurity (11/11/2020)   Hunger Vital Sign    Worried About Running Out of Food in the Last Year: Never true    Ran Out of Food in the Last Year: Never true  Transportation Needs: No Transportation Needs (11/11/2020)   PRAPARE - Administrator, Civil Service (Medical): No    Lack of Transportation (Non-Medical): No  Physical Activity: Inactive (11/11/2020)   Exercise Vital Sign    Days of Exercise per Week: 0 days    Minutes of Exercise per Session: 0 min  Stress: Stress Concern Present (11/11/2020)   [employer] of Occupational Health - Occupational Stress Questionnaire    Feeling of Stress : To some extent  Social Connections: Moderately Integrated (11/11/2020)   Social Connection and Isolation Panel [NHANES]    Frequency of Communication with Friends and Family: Three  times a week    Frequency of Social Gatherings with Friends and Family: Three times a week    Attends Religious Services: More than 4 times per year    Active Member of Clubs or Organizations: No    Attends Banker Meetings: Never    Marital Status: Married  Catering manager Violence: Not At Risk (11/11/2020)   Humiliation, Afraid, Rape, and Kick questionnaire    Fear of Current or Ex-Partner: No    Emotionally Abused: No    Physically Abused: No    Sexually Abused: No   Current Outpatient Medications on File Prior to Visit  Medication Sig Dispense Refill   aspirin EC 81 MG tablet Take 1 tablet (81 mg total) by mouth in the morning. 30 tablet 12   Dulaglutide (TRULICITY) 1.5 MG/0.5ML SOPN INJECT 1.5MG  SUBCUTANEOUSLY ONCE A WEEK 6 mL 3   fluticasone (FLONASE) 50 MCG/ACT nasal spray Place 2 sprays into both nostrils daily. 16 g 6   glucose blood (ONETOUCH VERIO) test strip USE AS DIRECTED 2 TIMES DAILY 200 strip 3   glucose blood test strip 200 each by Other route See admin instructions. 100 each 1   guaiFENesin-codeine (VIRTUSSIN A/C) 100-10 MG/5ML syrup Take 5 mLs by mouth 3 (three) times daily as needed for cough. (Patient not taking: Reported on 02/15/2023) 120 mL 0   insulin degludec (TRESIBA FLEXTOUCH) 200 UNIT/ML FlexTouch Pen Inject 26 Units into the skin at bedtime. 18 mL 3   Insulin Pen Needle (BD PEN NEEDLE NANO 2ND GEN) 32G X 4 MM MISC USE 4X DAILY AS DIRECTED  400 each 1   Lancets (ONETOUCH DELICA PLUS LANCET33G) MISC USE TO TEST 4 TIMES A DAY 100 each 7   LORazepam (ATIVAN) 1 MG tablet Take one tablet by mouth once daily, as needed, for uncontrolled nausea 30 tablet 3   methocarbamol (ROBAXIN-750) 750 MG tablet Take 1 tablet (750 mg total) by mouth every 8 (eight) hours as needed for muscle spasms. (Patient not taking: Reported on 02/15/2023) 30 tablet 0   omeprazole (PRILOSEC) 40 MG capsule Take 1 capsule (40 mg total) by mouth daily. 30 capsule 5   ondansetron  (ZOFRAN-ODT) 8 MG disintegrating tablet Take 1 tablet (8 mg total) by mouth every 8 (eight) hours as needed. (Patient not taking: Reported on 02/15/2023) 20 tablet 0   rosuvastatin (CRESTOR) 20 MG tablet TAKE 1 TABLET DAILY 90 tablet 1   temazepam (RESTORIL) 15 MG capsule Take 1 capsule (15 mg total) by mouth at bedtime as needed for sleep. 90 capsule 2   traZODone (DESYREL) 100 MG tablet TAKE 1 TABLET AT BEDTIME 90 tablet 1   triamterene-hydrochlorothiazide (DYAZIDE) 37.5-25 MG capsule Take 1 each (1 capsule total) by mouth every morning. 90 capsule 1   UNABLE TO FIND CPAP tubing and supplies, face mask Dx: G47.30 (Patient not taking: Reported on 02/15/2023) 1 each 0   venlafaxine XR (EFFEXOR-XR) 75 MG 24 hr capsule TAKE 1 CAPSULE DAILY 90 capsule 1   No current facility-administered medications on file prior to visit.   Allergies  Allergen Reactions   Penicillins Swelling    Has patient had a PCN reaction causing immediate rash, facial/tongue/throat swelling, SOB or lightheadedness with hypotension: Yes Has patient had a PCN reaction causing severe rash involving mucus membranes or skin necrosis: No Has patient had a PCN reaction that required hospitalization: No  Has patient had a PCN reaction occurring within the last 10 years: No If all of the above answers are "NO", then may proceed with Cephalosporin use.   Of face and tongue.   Metformin And Related Diarrhea and Nausea Only    Pt to discontinue med due to intolerance, cramps and diarrhea   Wound Dressing Adhesive Itching and Rash    Dermabond and other surgical glues for incisions   Jardiance [Empagliflozin] Other (See Comments)    Yeast infection     Statins Other (See Comments)    Fell bad but on Crestor   Zetia [Ezetimibe] Other (See Comments)    Feels funny   Family History  Problem Relation Age of Onset   Diabetes Mother    Arrhythmia Mother    Heart disease Father    Hyperlipidemia Father    Arrhythmia Father     Cancer Paternal Grandmother        Breast   Diabetes Sister    Diabetes Brother    Heart disease Brother    PE: BP 100/60 (BP Location: Right Arm, Patient Position: Sitting, Cuff Size: Normal)   Pulse (!) 104   Ht 5\' 6"  (1.676 m)   Wt 191 lb 3.2 oz (86.7 kg)   SpO2 96%   BMI 30.86 kg/m  Wt Readings from Last 3 Encounters:  02/17/23 191 lb 3.2 oz (86.7 kg)  02/15/23 191 lb 9.6 oz (86.9 kg)  12/14/22 191 lb 1.3 oz (86.7 kg)   Constitutional: overweight, in NAD Eyes: EOMI, no exophthalmos ENT: no thyromegaly, no cervical lymphadenopathy Cardiovascular: Tachycardia, RR, No MRG Respiratory: CTA B Musculoskeletal: no deformities Skin:  no rashes Neurological: no tremor with outstretched hands Diabetic  Foot Exam - Simple   Simple Foot Form Diabetic Foot exam was performed with the following findings: Yes 02/17/2023  2:52 PM  Visual Inspection No deformities, no ulcerations, no other skin breakdown bilaterally: Yes Sensation Testing Intact to touch and monofilament testing bilaterally: Yes Pulse Check Posterior Tibialis and Dorsalis pulse intact bilaterally: Yes Comments Indurated narrow toenail 3rd L foot    ASSESSMENT: 1. DM2, insulin-dependent, uncontrolled, without long-term complications, but with hyperglycemia  No family history of medullary thyroid cancer or personal history of pancreatitis.  2. HL  3.  Obesity class II  PLAN:  1. Patient with longstanding, uncontrolled, type 2 diabetes, on injectable antidiabetic regimen with long-acting insulin and weekly GLP-1 receptor agonist, with higher blood sugars at last visit.  At that time, HbA1c was higher, at 7.4%.  Sugars were elevated in the morning but at goal before dinner.  Upon questioning, she was having snacks after dinner in the form of Atkins bar or ice cream.  We discussed about stopping these if possible.  If the sugars remained elevated afterwards, I advised her that she may need a higher dose of Tresiba. -At  today's visit, sugars are mostly at goal with only occasional higher blood sugars before meals but approximately 4 months ago, she had sugars in the 200s after dinner.  As of now, she is not checking them after dinner and I advised her to start.  She is interested in a CGM and I gave her a written prescription for the freestyle libre 3 CGM to fill it from the pharmacy after Medicare starts 03/05/2023.  Otherwise, we can continue the current regimen. - I suggested to:  Patient Instructions  Please continue: - Tresiba 26 units daily - Trulicity 1.5 mg weekly  Please return in 4 months with your sugar log.   - we checked her HbA1c: 6.8% (lower) - advised to check sugars at different times of the day - 4x a day, rotating check times - advised for yearly eye exams >> she is UTD - we checked a foot exam today - return to clinic in 4 months  2. HL -Reviewed her latest lipid panel from 09/2022: Fractions at goal, Lab Results  Component Value Date   CHOL 130 09/14/2022   HDL 47 09/14/2022   LDLCALC 62 09/14/2022   TRIG 114 09/14/2022   CHOLHDL 2.8 09/14/2022  -She continues on Crestor 20 mg daily without side effects  3.  Adrenal adenoma -labs were repeatedly normal -Reviewed previous adrenal pathology from her surgery with Dr. Lady [name]: Benign adenomas.  This is likely a bilateral process for her. -Adrenal hormone testing from 2011 on was reviewed and this was normal -Latest investigation was in 2023 when we checked aldosterone, cortisol, plasma metanephrines and catecholamines and they were all normal.  A dexamethasone suppression test was also normal.  -Will continue to follow her without intervention for now  Carlus Pavlov, MD PhD [facility] Endocrinology

## 2023-02-18 ENCOUNTER — Encounter: Payer: Self-pay | Admitting: Pulmonary Disease

## 2023-02-18 ENCOUNTER — Ambulatory Visit (INDEPENDENT_AMBULATORY_CARE_PROVIDER_SITE_OTHER): Payer: BC Managed Care – PPO | Admitting: Pulmonary Disease

## 2023-02-18 VITALS — BP 104/60 | HR 88 | Ht 66.0 in | Wt 190.6 lb

## 2023-02-18 DIAGNOSIS — R0609 Other forms of dyspnea: Secondary | ICD-10-CM | POA: Diagnosis not present

## 2023-02-18 DIAGNOSIS — G4733 Obstructive sleep apnea (adult) (pediatric): Secondary | ICD-10-CM | POA: Diagnosis not present

## 2023-02-18 DIAGNOSIS — F17210 Nicotine dependence, cigarettes, uncomplicated: Secondary | ICD-10-CM

## 2023-02-18 NOTE — Assessment & Plan Note (Signed)
Emphysema noted on CT imaging. Will reassess with PFTs

## 2023-02-18 NOTE — Patient Instructions (Addendum)
X Home sleep test Based on this, we will get you a new machine  X LDCT from [facility]

## 2023-02-18 NOTE — Progress Notes (Signed)
Subjective:    Patient ID: Annette [name], female    DOB: 01-07-1961, 62 y.o.   MRN: [id number]  HPI  Chief Complaint  Patient presents with   Consult    Pt sleep consult has seen MW for DOE back in 2023. She was using CPAP but her's is now broke it has been >72yrs since she began therapy. Unsure of the DME company that provided her original machine but the brand is a resmed    62 year old smoker presents to establish care for emphysema and OSA. She smokes about half pack per day, about 25 pack years. OSA was diagnosed many years ago, sleep study done at [facility], she felt much improved after starting CPAP with an extra small fullface mask.  Her CPAP broke about 4 months ago.  Husband reports loud snoring and witnessed apneas. She has worked the night shift for several years and reports sleep onset insomnia. She takes trazodone and temazepam at bedtime around midnight, sleep latency can be up to an hour, she sleeps on her right side with 1 pillow, reports 1-2 nocturnal awakenings and is out of bed by 10 AM feeling tired with an occasional migraine headache without dryness of mouth. There is no history suggestive of cataplexy, sleep paralysis or parasomnias  Epworth sleepiness score is 6 and she reports some sleepiness while sitting and reading, watching TV or as a passenger in a car. She drinks 1 to 2 cups of coffee in a day and unsweet tea   PMH -diabetes type 2, insulin requiring  Significant tests/ events reviewed  PFTs 07/2017 no airway obstruction, ratio 81, FEV1 86%, FVC 82%, normal TLC, DLCO 15.7/58%.  LDCT chest 04/2021 mild emphysema, 2 mm nodule right upper lobe  Past Medical History:  Diagnosis Date   Acute blood loss anemia 02/16/2012   Arthritis    Phreesia 10/14/2020   Blood transfusion without reported diagnosis    Phreesia 10/14/2020   Breast cancer (HCC) 2007   History of  XRT, onTamoxifen   Bruises easily    Cancer (HCC)    Phreesia 10/14/2020    Depression    Depression    Phreesia 10/14/2020   Diabetes mellitus    prediabetic   Diabetes mellitus without complication (HCC)    Phreesia 10/14/2020   GERD (gastroesophageal reflux disease)    Hypertriglyceridemia    Hypothyroidism    following chemo amnd radiaition for breast cancer, needed replacement short term   Hypothyroidism (acquired)    replaced x 1 year   IBS (irritable bowel syndrome)    Menieres disease    Controlled with triamterene    Migraines    Obesity    Palpitations    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HCC)    Sleep apnea 2010   Problems with CPAP   Past Surgical History:  Procedure Laterality Date   ABDOMINAL EXPOSURE N/A 12/03/2020   Procedure: ABDOMINAL EXPOSURE;  Surgeon: Larina Earthly, MD;  Location: [facility] OR;  Service: Vascular;  Laterality: N/A;   ABDOMINAL HYSTERECTOMY  1998   Benign, fibroids   ADRENALECTOMY  02/16/2012   [facility], splemic trauma, 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   ANTERIOR LUMBAR FUSION N/A 12/03/2020   Procedure: Lumbar Four-Five Lumbar Five Sacral One Anterior lumbar interbody fusion;  Surgeon: Coletta Memos, MD;  Location: [facility] OR;  Service: Neurosurgery;  Laterality: N/A;   APPENDECTOMY  06/12/08   BIOPSY  07/02/2020   Procedure: BIOPSY;  Surgeon: Dolores Frame, MD;  Location: [facility];  Service: Gastroenterology;;  BIOPSY  01/22/2021   Procedure: BIOPSY;  Surgeon: Corbin Ade, MD;  Location: [facility];  Service: Endoscopy;;   BIOPSY  02/17/2022   Procedure: BIOPSY;  Surgeon: Malissa Hippo, MD;  [facility]: [facility];  Service: Endoscopy;;   BREAST LUMPECTOMY Left 2008   BREAST RECONSTRUCTION     Left reconstructive   BREAST SURGERY  2007   Left lumpectomy   BREAST SURGERY     Mammosite - right side   CHOLECYSTECTOMY  1985   COLONOSCOPY WITH PROPOFOL N/A 07/02/2020   Procedure: COLONOSCOPY WITH PROPOFOL;  Surgeon: Dolores Frame, MD;  [facility]: [facility];  Service: Gastroenterology;  Laterality: N/A;   1045   COLONOSCOPY WITH PROPOFOL N/A 04/01/2021   Procedure: COLONOSCOPY WITH PROPOFOL;  Surgeon: Dolores Frame, MD;  [facility]: [facility];  Service: Gastroenterology;  Laterality: N/A;  12:50   ESOPHAGEAL DILATION  12/19/2015   Procedure: ESOPHAGEAL DILATION;  Surgeon: Malissa Hippo, MD;  [facility];  Service: Endoscopy;;   ESOPHAGOGASTRODUODENOSCOPY N/A 12/19/2015   Procedure: ESOPHAGOGASTRODUODENOSCOPY (EGD);  Surgeon: Malissa Hippo, MD;  [facility];  Service: Endoscopy;  Laterality: N/A;  11:40   ESOPHAGOGASTRODUODENOSCOPY (EGD) WITH PROPOFOL N/A 02/17/2022   Procedure: ESOPHAGOGASTRODUODENOSCOPY (EGD) WITH PROPOFOL;  Surgeon: Malissa Hippo, MD;  [facility];  Service: Endoscopy;  Laterality: N/A;  1045   FLEXIBLE SIGMOIDOSCOPY N/A 01/22/2021   Procedure: FLEXIBLE SIGMOIDOSCOPY;  Surgeon: Corbin Ade, MD;  [facility];  Service: Endoscopy;  Laterality: N/A;   HEMOSTASIS CLIP PLACEMENT  04/01/2021   Procedure: HEMOSTASIS CLIP PLACEMENT;  Surgeon: Dolores Frame, MD;  [facility];  Service: Gastroenterology;;  x2   POLYPECTOMY  07/02/2020   Procedure: POLYPECTOMY;  Surgeon: Marguerita Merles, Reuel Boom, MD;  [facility];  Service: Gastroenterology;;   POLYPECTOMY  04/01/2021   Procedure: POLYPECTOMY INTESTINAL;  Surgeon: Marguerita Merles, Reuel Boom, MD;  [facility];  Service: Gastroenterology;;   SPLENECTOMY, 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   Of face and tongue.   Metformin And Related Diarrhea and Nausea Only    Pt to discontinue med due to intolerance, cramps and diarrhea   Wound Dressing Adhesive Itching and Rash    Dermabond and other surgical glues for incisions   Jardiance [Empagliflozin] Other (See Comments)    Yeast infection     Statins Other (See Comments)    Fell bad but on Crestor   Zetia [Ezetimibe] Other (See Comments)    Feels funny    Social History   Socioeconomic History   Marital status: Married    Spouse name: Clovis Riley   Number of children: 2   Years of education: Not on file   Highest education level: Not on file  Occupational History   Not on file  Tobacco Use   Smoking status: Every Day  Packs/day: 0.50    Years: 40.00    Additional pack years: 0.00    Total pack years: 20.00    Types: Cigarettes   Smokeless tobacco: Never   Tobacco comments:    1/2 pack a day    02/15/2023 Patient smokes about 1/2 pack daily  Vaping Use   Vaping Use: Never used  Substance and Sexual Activity   Alcohol use: No    Alcohol/week: 0.0 standard drinks of alcohol    Comment: Encouraged to quit smoking. She has tried the nicotone gum and patches but did not help   Drug use: No   Sexual activity: Yes    Birth control/protection: Surgical  Other Topics Concern   Not on file  Social History Narrative   Not on file   Social Determinants of Health   Financial Resource Strain: Medium Risk (11/11/2020)   Overall Financial Resource Strain (CARDIA)    Difficulty of Paying Living Expenses: Somewhat hard  Food Insecurity: No Food Insecurity (11/11/2020)   Hunger Vital Sign    Worried About Running Out of Food in the Last Year: Never true    Ran Out of Food in the Last Year: Never true  Transportation Needs: No Transportation Needs (11/11/2020)   PRAPARE -  Administrator, Civil Service (Medical): No    Lack of Transportation (Non-Medical): No  Physical Activity: Inactive (11/11/2020)   Exercise Vital Sign    Days of Exercise per Week: 0 days    Minutes of Exercise per Session: 0 min  Stress: Stress Concern Present (11/11/2020)   [employer] of Occupational Health - Occupational Stress Questionnaire    Feeling of Stress : To some extent  Social Connections: Moderately Integrated (11/11/2020)   Social Connection and Isolation Panel [NHANES]    Frequency of Communication with Friends and Family: Three times a week    Frequency of Social Gatherings with Friends and Family: Three times a week    Attends Religious Services: More than 4 times per year    Active Member of Clubs or Organizations: No    Attends Banker Meetings: Never    Marital Status: Married  Catering manager Violence: Not At Risk (11/11/2020)   Humiliation, Afraid, Rape, and Kick questionnaire    Fear of Current or Ex-Partner: No    Emotionally Abused: No    Physically Abused: No    Sexually Abused: No    Family History  Problem Relation Age of Onset   Diabetes Mother    Arrhythmia Mother    Heart disease Father    Hyperlipidemia Father    Arrhythmia Father    Cancer Paternal Grandmother        Breast   Diabetes Sister    Diabetes Brother    Heart disease Brother      Review of Systems Shortness of breath with activity Nonproductive cough Acid heartburn and indigestion Irregular heartbeat Abdominal pain Headaches Depression Joint stiffness    Objective:   Physical Exam  Gen. Pleasant, obese, in no distress, normal affect ENT - no pallor,icterus, no post nasal drip, class 2 airway Neck: No JVD, no thyromegaly, no carotid bruits Lungs: no use of accessory muscles, no dullness to percussion, decreased without rales or rhonchi  Cardiovascular: Rhythm regular, heart sounds  normal, no murmurs or gallops, no peripheral edema Abdomen:  soft and non-tender, no hepatosplenomegaly, BS normal. Musculoskeletal: No deformities, no cyanosis or clubbing Neuro:  alert, non focal, no tremors  Assessment & Plan:

## 2023-02-18 NOTE — Assessment & Plan Note (Signed)
Smoking cessation was emphasized is the most important intervention that would add years to her life.  She was not willing to commit to a quit attempt. She had lung cancer screening study done at [facility] and will try to obtain a copy

## 2023-02-18 NOTE — Assessment & Plan Note (Signed)
We will reassess degree of sleep disordered breathing with a home sleep test. It does seem that she has significant OSA due to witnessed apneas and loud snoring. Based on this we will provide her with a replacement auto CPAP with extra small fullface mask.  She seems to have been compliant and CPAP had helped improve her daytime somnolence and fatigue  Weight loss encouraged, compliance with goal of at least 4-6 hrs every night is the expectation. Advised against medications with sedative side effects Cautioned against driving when sleepy - understanding that sleepiness will vary on a day to day basis

## 2023-02-19 ENCOUNTER — Other Ambulatory Visit: Payer: Self-pay | Admitting: Family Medicine

## 2023-02-19 DIAGNOSIS — R0981 Nasal congestion: Secondary | ICD-10-CM

## 2023-03-17 ENCOUNTER — Other Ambulatory Visit: Payer: Self-pay | Admitting: Family Medicine

## 2023-03-17 DIAGNOSIS — E785 Hyperlipidemia, unspecified: Secondary | ICD-10-CM

## 2023-03-30 ENCOUNTER — Other Ambulatory Visit: Payer: Self-pay

## 2023-03-30 ENCOUNTER — Telehealth: Payer: Self-pay | Admitting: Pulmonary Disease

## 2023-03-30 ENCOUNTER — Encounter: Payer: Self-pay | Admitting: Family Medicine

## 2023-03-30 ENCOUNTER — Telehealth: Payer: Self-pay | Admitting: Family Medicine

## 2023-03-30 DIAGNOSIS — G4733 Obstructive sleep apnea (adult) (pediatric): Secondary | ICD-10-CM

## 2023-03-30 NOTE — Telephone Encounter (Signed)
Pt calling in for HST no order placed as of yet

## 2023-03-30 NOTE — Telephone Encounter (Signed)
Patient called to follow up on recent visit to [facility] for sinusus; stated med prescribed causes daily vomiting in the morning along with other sx: headache, weakness, and ongoing nausea. Patient stated she blacked out the night before last.  Requesting call back. Please advise at [phone].

## 2023-03-31 NOTE — Telephone Encounter (Signed)
See mychart msg patient aware

## 2023-04-01 NOTE — Telephone Encounter (Signed)
Contacted pt through " My Chart messaging

## 2023-04-05 NOTE — Telephone Encounter (Signed)
Called patient but she did not answer. Left message for her to call back to let her know I have placed the order for the HST.

## 2023-05-02 ENCOUNTER — Encounter: Payer: Self-pay | Admitting: Family Medicine

## 2023-05-03 ENCOUNTER — Other Ambulatory Visit: Payer: Self-pay

## 2023-05-03 MED ORDER — OMEPRAZOLE 40 MG PO CPDR: 40.0000 mg | DELAYED_RELEASE_CAPSULE | Freq: Every day | ORAL | 5 refills | Status: DC

## 2023-05-03 MED ORDER — TEMAZEPAM 15 MG PO CAPS: 15.0000 mg | ORAL_CAPSULE | Freq: Every evening | ORAL | 0 refills | Status: DC | PRN

## 2023-05-05 ENCOUNTER — Other Ambulatory Visit: Payer: Self-pay | Admitting: Family Medicine

## 2023-05-09 ENCOUNTER — Encounter (INDEPENDENT_AMBULATORY_CARE_PROVIDER_SITE_OTHER): Payer: BC Managed Care – PPO

## 2023-05-09 ENCOUNTER — Telehealth (HOSPITAL_BASED_OUTPATIENT_CLINIC_OR_DEPARTMENT_OTHER): Payer: Self-pay | Admitting: Pulmonary Disease

## 2023-05-09 DIAGNOSIS — G4734 Idiopathic sleep related nonobstructive alveolar hypoventilation: Secondary | ICD-10-CM

## 2023-05-09 DIAGNOSIS — G4733 Obstructive sleep apnea (adult) (pediatric): Secondary | ICD-10-CM

## 2023-05-09 NOTE — Telephone Encounter (Signed)
HST showed mild  OSA with AHI 9/ hr but oxygen desaturation was severe and oxygen level stayed low for 90% of the study Suggest CPAP titration study to see if she would also need nocturnal oxygen Also she needs to obtain PFTs to assess whether lung function has worsened

## 2023-05-13 NOTE — Telephone Encounter (Signed)
Spoke with patient and gave results and recommendations. She is already scheduled for PFT on 05/17/23. She agrees to CPAP titration study. Order placed.

## 2023-05-17 ENCOUNTER — Ambulatory Visit (HOSPITAL_COMMUNITY)
Admission: RE | Admit: 2023-05-17 | Discharge: 2023-05-17 | Disposition: A | Discharge status: Home / Self Care | Payer: Medicare HMO | Source: Ambulatory Visit | Attending: Pulmonary Disease | Admitting: Pulmonary Disease

## 2023-05-17 DIAGNOSIS — R0609 Other forms of dyspnea: Secondary | ICD-10-CM | POA: Insufficient documentation

## 2023-05-17 LAB — PULMONARY FUNCTION TEST
DL/VA % pred: 72 %
DL/VA: 3.01 ml/min/mmHg/L
DLCO unc % pred: 72 %
DLCO unc: 15.28 ml/min/mmHg
FEF 25-75 Post: 2.33 L/sec
FEF 25-75 Pre: 1.96 L/sec
FEF2575-%Change-Post: 18 %
FEF2575-%Pred-Post: 99 %
FEF2575-%Pred-Pre: 83 %
FEV1-%Change-Post: 4 %
FEV1-%Pred-Post: 96 %
FEV1-%Pred-Pre: 92 %
FEV1-Post: 2.58 L
FEV1-Pre: 2.48 L
FEV1FVC-%Change-Post: 5 %
FEV1FVC-%Pred-Pre: 95 %
FEV6-%Change-Post: 0 %
FEV6-%Pred-Post: 98 %
FEV6-%Pred-Pre: 98 %
FEV6-Post: 3.28 L
FEV6-Pre: 3.28 L
FEV6FVC-%Change-Post: 1 %
FEV6FVC-%Pred-Post: 103 %
FEV6FVC-%Pred-Pre: 101 %
FVC-%Change-Post: -1 %
FVC-%Pred-Post: 94 %
FVC-%Pred-Pre: 96 %
FVC-Post: 3.29 L
FVC-Pre: 3.34 L
Post FEV1/FVC ratio: 78 %
Post FEV6/FVC ratio: 100 %
Pre FEV1/FVC ratio: 74 %
Pre FEV6/FVC Ratio: 98 %
RV % pred: 101 %
RV: 2.18 L
TLC % pred: 104 %
TLC: 5.6 L

## 2023-05-17 MED ORDER — ALBUTEROL SULFATE (2.5 MG/3ML) 0.083% IN NEBU
2.5000 mg | INHALATION_SOLUTION | Freq: Once | RESPIRATORY_TRACT | Status: AC
  Administered 2023-05-17: 2.5 mg via RESPIRATORY_TRACT

## 2023-05-18 ENCOUNTER — Other Ambulatory Visit: Payer: Self-pay | Admitting: Family Medicine

## 2023-05-18 ENCOUNTER — Ambulatory Visit: Payer: BC Managed Care – PPO | Admitting: Family Medicine

## 2023-05-19 ENCOUNTER — Ambulatory Visit: Payer: Medicare HMO | Admitting: Pulmonary Disease

## 2023-05-19 ENCOUNTER — Ambulatory Visit (INDEPENDENT_AMBULATORY_CARE_PROVIDER_SITE_OTHER): Payer: Medicare HMO | Admitting: Family Medicine

## 2023-05-19 ENCOUNTER — Encounter: Payer: Self-pay | Admitting: Family Medicine

## 2023-05-19 ENCOUNTER — Encounter: Payer: Self-pay | Admitting: Pulmonary Disease

## 2023-05-19 VITALS — BP 91/53 | HR 101 | Ht 66.0 in | Wt 185.0 lb

## 2023-05-19 VITALS — BP 95/62 | HR 92 | Ht 66.0 in | Wt 185.1 lb

## 2023-05-19 DIAGNOSIS — J432 Centrilobular emphysema: Secondary | ICD-10-CM | POA: Insufficient documentation

## 2023-05-19 DIAGNOSIS — F411 Generalized anxiety disorder: Secondary | ICD-10-CM

## 2023-05-19 DIAGNOSIS — G4733 Obstructive sleep apnea (adult) (pediatric): Secondary | ICD-10-CM

## 2023-05-19 DIAGNOSIS — D72829 Elevated white blood cell count, unspecified: Secondary | ICD-10-CM

## 2023-05-19 DIAGNOSIS — R1904 Left lower quadrant abdominal swelling, mass and lump: Secondary | ICD-10-CM | POA: Diagnosis not present

## 2023-05-19 DIAGNOSIS — F17218 Nicotine dependence, cigarettes, with other nicotine-induced disorders: Secondary | ICD-10-CM

## 2023-05-19 DIAGNOSIS — E890 Postprocedural hypothyroidism: Secondary | ICD-10-CM | POA: Diagnosis not present

## 2023-05-19 DIAGNOSIS — Z794 Long term (current) use of insulin: Secondary | ICD-10-CM

## 2023-05-19 DIAGNOSIS — E279 Disorder of adrenal gland, unspecified: Secondary | ICD-10-CM

## 2023-05-19 DIAGNOSIS — F17210 Nicotine dependence, cigarettes, uncomplicated: Secondary | ICD-10-CM | POA: Diagnosis not present

## 2023-05-19 DIAGNOSIS — E278 Other specified disorders of adrenal gland: Secondary | ICD-10-CM

## 2023-05-19 DIAGNOSIS — E559 Vitamin D deficiency, unspecified: Secondary | ICD-10-CM

## 2023-05-19 DIAGNOSIS — E1169 Type 2 diabetes mellitus with other specified complication: Secondary | ICD-10-CM

## 2023-05-19 MED ORDER — AZITHROMYCIN 250 MG PO TABS: 250.0000 mg | ORAL_TABLET | Freq: Every day | ORAL | 0 refills | Status: DC

## 2023-05-19 MED ORDER — NICOTINE 21 MG/24HR TD PT24: 21.0000 mg | MEDICATED_PATCH | Freq: Every day | TRANSDERMAL | 0 refills | Status: DC

## 2023-05-19 NOTE — Patient Instructions (Addendum)
Annual exam 12/13 or after, call if you need me sooner  Wellness needs to be scheduled at checkout  Meds ( Ativan and naproxen) will be sent to  local [facility]  You are referred to Surgery regarding left lower abdominal swelling and pain, scan will be ordered also  New is Nicoderm patch no smoking with these  Labs today NOT urine, not needed  Thanks for choosing [facility], we consider it a privelige to serve you.

## 2023-05-19 NOTE — Assessment & Plan Note (Signed)
Lung function mostly preserved except for slight decrease in DLCO.  Again smoking cessation seems to be the most important intervention. She will use albuterol on an as-needed basis. Due to post bronchitic cough we will treat her with Z-Pak

## 2023-05-19 NOTE — Progress Notes (Signed)
   Subjective:    Patient ID: Annette [name], female    DOB: [birth year], 62 y.o.   MRN: [id number]  HPI  62 yo smoker for FU of emphysema and OSA. She smokes about half pack per day, about 25 pack years. She has worked the night shift for several years and reports sleep onset insomnia   PMH -diabetes type 2, insulin requiring  49-month follow-up visit. She reports a chronic cough since meeting with grandkids few weeks ago.  She reports productive yellow sputum, no wheezing.  Blood pressure is low today, she reports that this is chronic she occasionally has dizziness especially when she gets up from a sitting position. She continues to smoke half pack per day  Husband accompanies and reports loud snoring and witnessed apneas   Significant tests/ events reviewed  HST 05/2023 showed mild  OSA with AHI 9/ hr but oxygen desaturation was severe and oxygen level stayed low for 90% of the study       PFTs 07/2017 no airway obstruction, ratio 81, FEV1 86%, FVC 82%, normal TLC, DLCO 15.7/58%.   LDCT chest 04/2021 mild emphysema, 2 mm nodule right upper lobe  Review of Systems neg for any significant sore throat, dysphagia, itching, sneezing, nasal congestion or excess/ purulent secretions, fever, chills, sweats, unintended wt loss, pleuritic or exertional cp, hempoptysis, orthopnea pnd or change in chronic leg swelling. Also denies presyncope, palpitations, heartburn, abdominal pain, nausea, vomiting, diarrhea or change in bowel or urinary habits, dysuria,hematuria, rash, arthralgias, visual complaints, headache, numbness weakness or ataxia.     Objective:   Physical Exam  Gen. Pleasant, well-nourished, in no distress ENT - no thrush, no pallor/icterus,no post nasal drip Neck: No JVD, no thyromegaly, no carotid bruits Lungs: no use of accessory muscles, no dullness to percussion, clear without rales or rhonchi  Cardiovascular: Rhythm regular, heart sounds  normal, no murmurs or gallops,  no peripheral edema Musculoskeletal: No deformities, no cyanosis or clubbing        Assessment & Plan:   Hypotension -this is chronic for her.  She is relatively asymptomatic although complains of occasional dizziness when she gets up from sitting position?  Orthostatic. She is on Dyazide for Mnire's disease and would suggest decreasing the dose.  She will discuss with PCP

## 2023-05-19 NOTE — Assessment & Plan Note (Signed)
She only had mild OSA on sleep study Previously she reports good improvement with CPAP, will provide her with replacement auto CPAP 5 to 12 cm in the meantime. Due to degree of desaturation we will schedule formal CPAP titration study to see if additional oxygen required

## 2023-05-19 NOTE — Assessment & Plan Note (Signed)
Asked to use nicotine patch 14 mg/day. If she fails this, can use Nicotrol inhaler

## 2023-05-19 NOTE — Patient Instructions (Addendum)
X Rx for Z-Pak  X Rx for auto CPAP 5 to 12 cm, mask of choice -To [facility]  Schedule CPAP titration study  Discuss diuretics and low blood pressure with your PCP

## 2023-05-20 ENCOUNTER — Other Ambulatory Visit: Payer: Self-pay

## 2023-05-20 DIAGNOSIS — Z794 Long term (current) use of insulin: Secondary | ICD-10-CM

## 2023-05-20 MED ORDER — TRESIBA FLEXTOUCH 200 UNIT/ML ~~LOC~~ SOPN
26.0000 [IU] | PEN_INJECTOR | Freq: Every day | SUBCUTANEOUS | 1 refills | Status: DC
Start: 2023-05-20 — End: 2023-10-20

## 2023-05-24 ENCOUNTER — Other Ambulatory Visit: Payer: Self-pay | Admitting: Family Medicine

## 2023-05-24 ENCOUNTER — Other Ambulatory Visit: Payer: Self-pay | Admitting: Internal Medicine

## 2023-05-24 DIAGNOSIS — E1165 Type 2 diabetes mellitus with hyperglycemia: Secondary | ICD-10-CM

## 2023-05-27 ENCOUNTER — Encounter: Payer: Self-pay | Admitting: Family Medicine

## 2023-05-27 ENCOUNTER — Encounter: Payer: Self-pay | Admitting: Pulmonary Disease

## 2023-05-27 MED ORDER — LORAZEPAM 1 MG PO TABS: ORAL_TABLET | ORAL | 1 refills | Status: DC

## 2023-05-29 ENCOUNTER — Encounter: Payer: Self-pay | Admitting: Family Medicine

## 2023-05-29 DIAGNOSIS — N1831 Chronic kidney disease, stage 3a: Secondary | ICD-10-CM

## 2023-05-29 MED ORDER — BUTALBITAL-APAP-CAFFEINE 50-325-40 MG PO TABS: ORAL_TABLET | ORAL | 0 refills | Status: DC

## 2023-05-29 MED ORDER — NICOTINE 7 MG/24HR TD PT24: 7.0000 mg | MEDICATED_PATCH | Freq: Every day | TRANSDERMAL | 0 refills | Status: DC

## 2023-05-29 MED ORDER — NICOTINE 14 MG/24HR TD PT24: 14.0000 mg | MEDICATED_PATCH | Freq: Every day | TRANSDERMAL | 0 refills | Status: DC

## 2023-05-29 NOTE — Assessment & Plan Note (Signed)
Rept imaging ordered as new nodule seen in 2022

## 2023-05-29 NOTE — Assessment & Plan Note (Addendum)
Annette [name] is reminded of the importance of commitment to daily physical activity for 30 minutes or more, as able and the need to limit carbohydrate intake to 30 to 60 grams per meal to help with blood sugar control.   The need to take medication as prescribed, test blood sugar as directed, and to call between visits if there is a concern that blood sugar is uncontrolled is also discussed.   Annette [name] is reminded of the importance of daily foot exam, annual eye examination, and good blood sugar, blood pressure and cholesterol control.     Latest Ref Rng & Units 05/19/2023    4:02 PM 02/17/2023    2:48 PM 10/19/2022    2:43 PM 09/14/2022    4:16 PM 06/14/2022    3:10 PM  Diabetic Labs  HbA1c 4.0 - 5.6 %  6.8  7.4   7.1   Micro/Creat Ratio 0 - 29 mg/g creat    18    Chol 100 - 199 mg/dL 846    962    HDL >95 mg/dL 54    47    Calc LDL 0 - 99 mg/dL 59    62    Triglycerides 0 - 149 mg/dL 284    132    Creatinine 0.57 - 1.00 mg/dL 4.40    1.02        04/28/3663    3:11 PM 05/19/2023    1:45 PM 02/18/2023   11:19 AM 02/17/2023    1:59 PM 02/15/2023    3:07 PM 12/14/2022    3:33 PM 10/19/2022    2:34 PM  BP/Weight  Systolic BP 95 91 104 100 94 105 128  Diastolic BP 62 53 60 60 62 66 78  Wt. (Lbs) 185.08 185 190.6 191.2 191.6 191.08 193.6  BMI 29.87 kg/m2 29.86 kg/m2 30.76 kg/m2 30.86 kg/m2 30.93 kg/m2 30.84 kg/m2 31.25 kg/m2      Latest Ref Rng & Units 02/17/2023    2:40 PM 12/15/2022   12:00 AM  Foot/eye exam completion dates  Eye Exam No Retinopathy  No Retinopathy      Foot Form Completion  Done      This result is from an external source.   Controlled and managed by Endo

## 2023-05-29 NOTE — Assessment & Plan Note (Signed)
Controlled, no change in medication  

## 2023-05-29 NOTE — Assessment & Plan Note (Signed)
Currently corrected ,OTC 1000 units daily

## 2023-05-29 NOTE — Assessment & Plan Note (Addendum)
Has f/u appointment already scheduled

## 2023-05-29 NOTE — Progress Notes (Signed)
Annette [name]     MRN: [id number]      DOB: 1960/11/17  Chief Complaint  Patient presents with   Follow-up    Follow up,requesting nicotine patches    HPI Annette [name] is here for follow up and re-evaluation of chronic medical conditions, medication management and review of any available recent lab and radiology data.  Preventive health is updated, specifically  Cancer screening and Immunization.   Questions or concerns regarding consultations or procedures which the PT has had in the interim are  addressed. The PT denies any adverse reactions to current medications since the last visit.  C/o left lower abdominal swelling which ois uncomfortable at times   ROS Denies recent fever or chills. Denies sinus pressure, nasal congestion, ear pain or sore throat. Denies chest congestion, productive cough or wheezing. Denies chest pains, palpitations and leg swelling  Denies dysuria, frequency, hesitancy or incontinence. Denies joint pain, swelling and limitation in mobility. Denies headaches, seizures, numbness, or tingling. Denies uncontrolled depression, anxiety or insomnia. Denies skin break down or rash.   PE  BP 95/62 (BP Location: Right Arm, Patient Position: Sitting, Cuff Size: Normal)   Pulse 92   Ht 5\' 6"  (1.676 m)   Wt 185 lb 1.3 oz (84 kg)   SpO2 94%   BMI 29.87 kg/m   Patient alert and oriented and in no cardiopulmonary distress.  HEENT: No facial asymmetry, EOMI,     Neck supple .  Chest: Clear to auscultation bilaterally.decreased air entry  CVS: S1, S2 no murmurs, no S3.Regular rate.  ABD: Soft non tender. LLQ distension, tender , likely hernia  Ext: No edema  MS: Adequate ROM spine, shoulders, hips and knees.  Skin: Intact, no ulcerations or rash noted.  Psych: Good eye contact, normal affect. Memory intact not anxious or depressed appearing.  CNS: CN 2-12 intact, power,  normal throughout.no focal deficits noted.   Assessment & Plan Adrenal  nodule (HCC) Rept imaging ordered as new nodule seen in 2022  Cigarette smoker Asked:confirms currently smokes cigarettes Assess: Unwilling to set a quit date, but is cutting back Advise: needs to QUIT to reduce risk of cancer, cardio and cerebrovascular disease Assist: counseled for 5 minutes and literature provided Arrange: follow up in 2 to 4 months Script sent for patches  History of partial thyroidectomy Normal TSH   GAD (generalized anxiety disorder) Controlled, no change in medication   Leukocytosis Has f/u appointment already scheduled  Type 2 diabetes mellitus with other specified complication (HCC) Annette [name] is reminded of the importance of commitment to daily physical activity for 30 minutes or more, as able and the need to limit carbohydrate intake to 30 to 60 grams per meal to help with blood sugar control.   The need to take medication as prescribed, test blood sugar as directed, and to call between visits if there is a concern that blood sugar is uncontrolled is also discussed.   Annette [name] is reminded of the importance of daily foot exam, annual eye examination, and good blood sugar, blood pressure and cholesterol control.     Latest Ref Rng & Units 05/19/2023    4:02 PM 02/17/2023    2:48 PM 10/19/2022    2:43 PM 09/14/2022    4:16 PM 06/14/2022    3:10 PM  Diabetic Labs  HbA1c 4.0 - 5.6 %  6.8  7.4   7.1   Micro/Creat Ratio 0 - 29 mg/g creat    18  Chol 100 - 199 mg/dL 564    332    HDL >95 mg/dL 54    47    Calc LDL 0 - 99 mg/dL 59    62    Triglycerides 0 - 149 mg/dL 188    416    Creatinine 0.57 - 1.00 mg/dL 6.06    3.01        03/05/931    3:11 PM 05/19/2023    1:45 PM 02/18/2023   11:19 AM 02/17/2023    1:59 PM 02/15/2023    3:07 PM 12/14/2022    3:33 PM 10/19/2022    2:34 PM  BP/Weight  Systolic BP 95 91 104 100 94 105 128  Diastolic BP 62 53 60 60 62 66 78  Wt. (Lbs) 185.08 185 190.6 191.2 191.6 191.08 193.6  BMI 29.87 kg/m2 29.86 kg/m2  30.76 kg/m2 30.86 kg/m2 30.93 kg/m2 30.84 kg/m2 31.25 kg/m2      Latest Ref Rng & Units 02/17/2023    2:40 PM 12/15/2022   12:00 AM  Foot/eye exam completion dates  Eye Exam No Retinopathy  No Retinopathy      Foot Form Completion  Done      This result is from an external source.   Controlled and managed by Endo     Vitamin D deficiency Currently corrected ,OTC 1000 units daily

## 2023-05-29 NOTE — Assessment & Plan Note (Signed)
Normal TSH

## 2023-05-29 NOTE — Assessment & Plan Note (Signed)
Asked:confirms currently smokes cigarettes Assess: Unwilling to set a quit date, but is cutting back Advise: needs to QUIT to reduce risk of cancer, cardio and cerebrovascular disease Assist: counseled for 5 minutes and literature provided Arrange: follow up in 2 to 4 months Script sent for patches

## 2023-05-30 ENCOUNTER — Other Ambulatory Visit: Payer: Self-pay

## 2023-05-30 DIAGNOSIS — D72829 Elevated white blood cell count, unspecified: Secondary | ICD-10-CM

## 2023-05-30 NOTE — Telephone Encounter (Signed)
Dr. Vassie Loll put in this order himself and it also had Pulmonary Disease on it no one had touched the order. It has not been faxed to [facility]

## 2023-05-31 ENCOUNTER — Inpatient Hospital Stay: Payer: Medicare HMO | Attending: Hematology | Admitting: Hematology

## 2023-05-31 ENCOUNTER — Inpatient Hospital Stay: Payer: Medicare HMO

## 2023-05-31 VITALS — BP 90/61 | HR 89 | Temp 98.1°F | Resp 18 | Ht 66.0 in | Wt 185.6 lb

## 2023-05-31 DIAGNOSIS — D72829 Elevated white blood cell count, unspecified: Secondary | ICD-10-CM | POA: Diagnosis present

## 2023-05-31 DIAGNOSIS — Z853 Personal history of malignant neoplasm of breast: Secondary | ICD-10-CM | POA: Diagnosis not present

## 2023-05-31 DIAGNOSIS — Z87891 Personal history of nicotine dependence: Secondary | ICD-10-CM

## 2023-05-31 DIAGNOSIS — Z122 Encounter for screening for malignant neoplasm of respiratory organs: Secondary | ICD-10-CM | POA: Diagnosis not present

## 2023-05-31 LAB — CBC WITH DIFFERENTIAL/PLATELET
Abs Immature Granulocytes: 0 10*3/uL (ref 0.00–0.07)
Basophils Absolute: 0.1 10*3/uL (ref 0.0–0.1)
Basophils Relative: 1 %
Eosinophils Absolute: 0.3 10*3/uL (ref 0.0–0.5)
Eosinophils Relative: 2 %
HCT: 47 % — ABNORMAL HIGH (ref 36.0–46.0)
Hemoglobin: 16 g/dL — ABNORMAL HIGH (ref 12.0–15.0)
Lymphocytes Relative: 41 %
Lymphs Abs: 6.1 10*3/uL — ABNORMAL HIGH (ref 0.7–4.0)
MCH: 33.2 pg (ref 26.0–34.0)
MCHC: 34 g/dL (ref 30.0–36.0)
MCV: 97.5 fL (ref 80.0–100.0)
Monocytes Absolute: 0.7 10*3/uL (ref 0.1–1.0)
Monocytes Relative: 5 %
Neutro Abs: 7.6 10*3/uL (ref 1.7–7.7)
Neutrophils Relative %: 51 %
Platelets: 376 10*3/uL (ref 150–400)
RBC: 4.82 MIL/uL (ref 3.87–5.11)
RDW: 13.2 % (ref 11.5–15.5)
WBC: 14.9 10*3/uL — ABNORMAL HIGH (ref 4.0–10.5)
nRBC: 0.1 % (ref 0.0–0.2)

## 2023-05-31 LAB — COMPREHENSIVE METABOLIC PANEL
ALT: 25 U/L (ref 0–44)
AST: 24 U/L (ref 15–41)
Albumin: 3.9 g/dL (ref 3.5–5.0)
Alkaline Phosphatase: 71 U/L (ref 38–126)
Anion gap: 8 (ref 5–15)
BUN: 17 mg/dL (ref 8–23)
CO2: 26 mmol/L (ref 22–32)
Calcium: 9.3 mg/dL (ref 8.9–10.3)
Chloride: 102 mmol/L (ref 98–111)
Creatinine, Ser: 1.04 mg/dL — ABNORMAL HIGH (ref 0.44–1.00)
GFR, Estimated: >60 mL/min (ref >60)
Glucose, Bld: 104 mg/dL — ABNORMAL HIGH (ref 70–99)
Potassium: 3.8 mmol/L (ref 3.5–5.1)
Sodium: 136 mmol/L (ref 135–145)
Total Bilirubin: 0.9 mg/dL (ref 0.3–1.2)
Total Protein: 7.5 g/dL (ref 6.5–8.1)

## 2023-05-31 LAB — LACTATE DEHYDROGENASE: LDH: 141 U/L (ref 98–192)

## 2023-05-31 NOTE — Patient Instructions (Addendum)
[facility]  Discharge Instructions  You were seen and examined today by Dr. Ellin Saba.  Dr. Ellin Saba discussed your most recent lab work which revealed that everything looks good and stable.  Dr. Ellin Saba is going to schedule you for CT low dose lung screening.  Follow-up as scheduled in 1 year.    Thank you for choosing [facility] to provide your oncology and hematology care.   To afford each patient quality time with our provider, please arrive at least 15 minutes before your scheduled appointment time. You may need to reschedule your appointment if you arrive late (10 or more minutes). Arriving late affects you and other patients whose appointments are after yours.  Also, if you miss three or more appointments without notifying the office, you may be dismissed from the clinic at the provider's discretion.    Again, thank you for choosing [facility].  Our hope is that these requests will decrease the amount of time that you wait before being seen by our physicians.   If you have a lab appointment with the Cancer Center - please note that after April 8th, all labs will be drawn in the cancer center.  You do not have to check in or register with the main entrance as you have in the past but will complete your check-in at the cancer center.            _____________________________________________________________  Should you have questions after your visit to [facility], please contact our office at [phone] and follow the prompts.  Our office hours are 8:00 a.m. to 4:30 p.m. Monday - Thursday and 8:00 a.m. to 2:30 p.m. Friday.  Please note that voicemails left after 4:00 p.m. may not be returned until the following business day.  We are closed weekends and all major holidays.  You do have access to a nurse 24-7, just call the main number to the clinic [phone] and do not press any options, hold  on the line and a nurse will answer the phone.    For prescription refill requests, have your pharmacy contact our office and allow 72 hours.    Masks are no longer required in the cancer centers. If you would like for your care team to wear a mask while they are taking care of you, please let them know. You may have one support person who is at least 64 years old accompany you for your appointments.

## 2023-05-31 NOTE — Progress Notes (Signed)
[facility] [address] [place] [address]    Clinic Day:  05/31/2023  Referring physician: Kerri Perches, MD  Patient Care Team: Kerri Perches, MD as PCP - General O'Neal, Ronnald Ramp, MD as PCP - Cardiology (Cardiology) Doreatha Massed, MD as Consulting Physician (Hematology)   ASSESSMENT & PLAN:   Assessment: Right adrenal nodules: - CT chest on 04/17/2021 showed 2 right adrenal nodules, measuring 2.6 x 1.7 cm, 3.0 x 1.7 cm compatible with adrenal adenomas. 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 - CT abdomen in May 2013 showed 1.5 cm right adrenal nodule. - She reportedly lost about 15 pounds since March of this year when she was hospitalized with vomiting diarrhea and dehydration.   Social/family history: - Lives at home with her husband. 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 - She smokes half pack per day for 45 years. - Paternal grandmother had breast cancer.  3.  Stage I left breast cancer, ER positive, HER2 positive: - Diagnosed on 12/10/2005, status post lumpectomy. - Taxotere, Cytoxan plus Herceptin for 6 cycles followed by 1 year of Herceptin. - 5 years of tamoxifen completed November 2012.  Plan: Right adrenal nodules: -She had incidental right adrenal nodules found on CT scan of the chest. -Blood work and 24-hour urinary metanephrines, normetanephrine's and cortisol levels were within normal limits. -Continue follow-ups with Dr. Lafe Garin.  2.  JAK2 and BCR/ABL negative leukocytosis: - Denies any fevers, night sweats or weight loss.  She is having migraine headaches.  No echogenic pruritus. - Reviewed labs from 05/31/2023: Normal LFTs.  Creatinine 1.04.  CBC shows white count 14.9 and platelet count 376.  Hemoglobin is 16.  Differential shows elevated absolute lymphocyte count. - Previous workup with flow cytometry was  negative for lymphoproliferative process. - RTC 1 year for follow-up.   3.  Left breast cancer: - Mammogram on 12/16/2022: BI-RADS Category 1.  Continue yearly mammograms.  4.  Smoking history: - Last low-dose CT scan was in July 2022. - Will schedule another 6 low-dose CT of the chest.  She is continuing to smoke half pack to 1 pack/day.  Orders Placed This Encounter  Procedures   CT CHEST LUNG CA SCREEN LOW DOSE W/O CM    Standing Status:   Future    Standing Expiration Date:   05/30/2024    Order Specific Question:   Preferred Imaging Location?    Answer:   [facility]    Order Specific Question:   Release to patient    Answer:   Immediate [1]   CBC with Differential/Platelet    Standing Status:   Future    Standing Expiration Date:   05/30/2024    Order Specific Question:   Release to patient    Answer:   Immediate   Comprehensive metabolic panel    Standing Status:   Future    Standing Expiration Date:   05/30/2024    Order Specific Question:   Release to patient    Answer:   Immediate   Lactate dehydrogenase    Standing Status:   Future    Standing Expiration Date:   05/30/2024    Order Specific Question:   Release to patient    Answer:   Immediate      I,Helena R Teague,acting as a scribe for Doreatha Massed, MD.,have documented all relevant documentation  on the behalf of Doreatha Massed, MD,as directed by  Doreatha Massed, MD while in the presence of Doreatha Massed, MD.   I, Doreatha Massed MD, have reviewed the above documentation for accuracy and completeness, and I agree with the above.   Doreatha Massed, MD   8/27/20244:14 PM  CHIEF COMPLAINT:   Diagnosis: Leukocytosis  Prior Therapy: left adrenalectomy and splenectomy on 02/16/2012   Current Therapy:  surveillance   HISTORY OF PRESENT ILLNESS:   Oncology History   No history exists.     INTERVAL HISTORY:   [name] presenting to clinic today  for follow up of leukocytosis. She was last seen by me on 06/02/22.  Today, she states that she is doing well overall. Her appetite level is at 40%. Her energy level is at 0%. She is accompanied by her husband.   She has had a migraine for the last two days. She notes Dr. Lodema Hong told her to stop taking Naproxen, which she normally treats for her migraines, and prescribed her Fioricet which is ineffective in relieving her pain. She currently smokes 0.5 ppd. She has a CT abdomen on 06/29/23.   PAST MEDICAL HISTORY:   Past Medical History: Past Medical History:  Diagnosis Date   Acute blood loss anemia 02/16/2012   Arthritis    Phreesia 10/14/2020   Blood transfusion without reported diagnosis    Phreesia 10/14/2020   Breast cancer (HCC) 2007   History of  XRT, onTamoxifen   Bruises easily    Cancer (HCC)    Phreesia 10/14/2020   Depression    Depression    Phreesia 10/14/2020   Diabetes mellitus    prediabetic   Diabetes mellitus without complication (HCC)    Phreesia 10/14/2020   GERD (gastroesophageal reflux disease)    Hypertriglyceridemia    Hypothyroidism    following chemo amnd radiaition for breast cancer, needed replacement short term   Hypothyroidism (acquired)    replaced x 1 year   IBS (irritable bowel syndrome)    Menieres disease    Controlled with triamterene    Migraines    Obesity    Palpitations    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HCC)    Sleep apnea 2010   Problems with CPAP    Surgical History: Past Surgical History:  Procedure Laterality Date   ABDOMINAL EXPOSURE N/A 12/03/2020   Procedure: ABDOMINAL EXPOSURE;  Surgeon: Larina Earthly, MD;  Location: [facility] OR;  Service: Vascular;  Laterality: N/A;   ABDOMINAL HYSTERECTOMY  1998   Benign, fibroids   ADRENALECTOMY  02/16/2012   [facility], splemic trauma, 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   ANTERIOR LUMBAR FUSION N/A 12/03/2020   Procedure: Lumbar Four-Five Lumbar Five Sacral One Anterior lumbar interbody fusion;  Surgeon:  Coletta Memos, MD;  Location: [facility] OR;  Service: Neurosurgery;  Laterality: N/A;   APPENDECTOMY  06/12/08   BIOPSY  07/02/2020   Procedure: BIOPSY;  Surgeon: Dolores Frame, MD;  Location: [facility];  Service: Gastroenterology;;   BIOPSY  01/22/2021   Procedure: BIOPSY;  Surgeon: Corbin Ade, MD;  Location: [facility];  Service: Endoscopy;;   BIOPSY  02/17/2022   Procedure: BIOPSY;  Surgeon: Malissa Hippo, MD;  [facility];  Service: Endoscopy;;   BREAST LUMPECTOMY Left 2008   BREAST RECONSTRUCTION     Left reconstructive   BREAST SURGERY  2007   Left lumpectomy   BREAST SURGERY     Mammosite - right side   CHOLECYSTECTOMY  1985   COLONOSCOPY WITH PROPOFOL N/A 07/02/2020   Procedure: COLONOSCOPY WITH PROPOFOL;  Surgeon: Dolores Frame, MD;  [facility]: [facility];  Service: Gastroenterology;  Laterality: N/A;  1045   COLONOSCOPY WITH PROPOFOL N/A 04/01/2021   Procedure: COLONOSCOPY WITH PROPOFOL;  Surgeon: Dolores Frame, MD;  [facility]: [facility];  Service: Gastroenterology;  Laterality: N/A;  12:50   ESOPHAGEAL DILATION  12/19/2015   Procedure: ESOPHAGEAL DILATION;  Surgeon: Malissa Hippo, MD;  [facility];  Service: Endoscopy;;   ESOPHAGOGASTRODUODENOSCOPY N/A 12/19/2015   Procedure: ESOPHAGOGASTRODUODENOSCOPY (EGD);  Surgeon: Malissa Hippo, MD;  [facility];  Service: Endoscopy;  Laterality: N/A;  11:40   ESOPHAGOGASTRODUODENOSCOPY (EGD) WITH PROPOFOL N/A 02/17/2022   Procedure: ESOPHAGOGASTRODUODENOSCOPY (EGD) WITH PROPOFOL;  Surgeon: Malissa Hippo, MD;  [facility];  Service: Endoscopy;  Laterality: N/A;  1045   FLEXIBLE SIGMOIDOSCOPY N/A 01/22/2021   Procedure: FLEXIBLE SIGMOIDOSCOPY;  Surgeon: Corbin Ade, MD;  [facility];  Service: Endoscopy;  Laterality: N/A;   HEMOSTASIS CLIP PLACEMENT  04/01/2021   Procedure: HEMOSTASIS CLIP PLACEMENT;  Surgeon: Dolores Frame, MD;  [facility];  Service: Gastroenterology;;  x2   POLYPECTOMY  07/02/2020   Procedure: POLYPECTOMY;  Surgeon: Marguerita Merles, Reuel Boom, MD;  [facility];  Service: Gastroenterology;;   POLYPECTOMY  04/01/2021   Procedure: POLYPECTOMY INTESTINAL;  Surgeon: Dolores Frame, MD;  [facility];  Service: Gastroenterology;;   SPLENECTOMY, 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   THYROIDECTOMY, PARTIAL  11/05/2010   Benign disease    Social History: Social History   Socioeconomic History   Marital status: Married    Spouse name: Clovis Riley   Number of children: 2   Years of education: Not on file   Highest education level: Not on file  Occupational History   Not on file  Tobacco Use   Smoking status: Every Day    Current packs/day: 0.50    Average packs/day: 0.5 packs/day for 40.0 years (20.0 ttl pk-yrs)    Types: Cigarettes   Smokeless tobacco: Never   Tobacco comments:    1/2 pack a day    02/15/2023 Patient smokes about 1/2 pack daily  Vaping Use   Vaping status: Never Used  Substance and Sexual Activity   Alcohol use: No    Alcohol/week: 0.0 standard drinks of alcohol    Comment: Encouraged to quit smoking. She has tried the nicotone gum and patches but did not help   Drug use: No   Sexual activity: Yes    Birth control/protection: Surgical  Other Topics Concern   Not on file  Social History Narrative   Not on file   Social Determinants of Health   Financial Resource Strain: Medium Risk (11/11/2020)   Overall Financial Resource Strain (CARDIA)    Difficulty of Paying Living Expenses: Somewhat hard  Food Insecurity: No Food Insecurity (11/11/2020)   Hunger Vital Sign    Worried About Running Out of Food in the Last Year: Never true    Ran Out of Food in the Last Year: Never true  Transportation Needs: No Transportation Needs (11/11/2020)   PRAPARE - Scientist, research (physical sciences) (Medical): No    Lack of Transportation (Non-Medical): No  Physical Activity: Inactive (11/11/2020)   Exercise Vital Sign    Days of Exercise per Week: 0 days    Minutes  of Exercise per Session: 0 min  Stress: Stress Concern Present (11/11/2020)   [employer] of Occupational Health - Occupational Stress Questionnaire    Feeling of Stress : To some extent  Social Connections: Moderately Integrated (11/11/2020)   Social Connection and Isolation Panel [NHANES]    Frequency of Communication with Friends and Family: Three times a week    Frequency of Social Gatherings with Friends and Family: Three times a week    Attends Religious Services: More than 4 times per year    Active Member of Clubs or Organizations: No    Attends Banker Meetings: Never    Marital Status: Married  Catering manager Violence: Not At Risk (11/11/2020)   Humiliation, Afraid, Rape, and Kick questionnaire    Fear of Current or Ex-Partner: No    Emotionally Abused: No    Physically Abused: No    Sexually Abused: No    Family History: Family History  Problem Relation Age of Onset   Diabetes Mother    Arrhythmia Mother    Heart disease Father    Hyperlipidemia Father    Arrhythmia Father    Cancer Paternal Grandmother        Breast   Diabetes Sister    Diabetes Brother    Heart disease Brother     Current Medications:  Current Outpatient Medications:    albuterol (VENTOLIN HFA) 108 (90 Base) MCG/ACT inhaler, TAKE 2 PUFFS BY MOUTH EVERY 6 HOURS AS NEEDED FOR WHEEZE OR SHORTNESS OF BREATH, Disp: , Rfl:    azithromycin (ZITHROMAX) 250 MG tablet, Take 1 tablet (250 mg total) by mouth daily., Disp: 6 tablet, Rfl: 0   butalbital-acetaminophen-caffeine (FIORICET) 50-325-40 MG tablet, Take one tablet by mouth every 8  to 12 hours , as needed, for uncontrolled headache, Disp: 20 tablet, Rfl: 0   Continuous Glucose Sensor (FREESTYLE LIBRE 3 SENSOR) MISC, 1 each by Does not apply route every  14 (fourteen) days., Disp: 6 each, Rfl: 3   Dulaglutide (TRULICITY) 1.5 MG/0.5ML SOPN, INJECT 1.5MG  SUBCUTANEOUSLY ONCE A WEEK, Disp: 6 mL, Rfl: 3   fluticasone (FLONASE) 50 MCG/ACT nasal spray, SPRAY 2 SPRAYS INTO EACH NOSTRIL EVERY DAY, Disp: 48 mL, Rfl: 2   insulin degludec (TRESIBA FLEXTOUCH) 200 UNIT/ML FlexTouch Pen, Inject 26 Units into the skin at bedtime., Disp: 18 mL, Rfl: 1   Insulin Pen Needle (BD PEN NEEDLE NANO 2ND GEN) 32G X 4 MM MISC, USE 4X DAILY AS DIRECTED, Disp: 400 each, Rfl: 1   ketorolac (TORADOL) 10 MG tablet, Take 1 tablet by mouth every 6 (six) hours as needed., Disp: , Rfl:    Lancets (ONETOUCH DELICA PLUS LANCET33G) MISC, USE TO TEST 4 TIMES A DAY, Disp: 100 each, Rfl: 7   LORazepam (ATIVAN) 1 MG tablet, Take one tablet by mouth once daily, as needed, for uncontrolled nausea, Disp: 30 tablet, Rfl: 1   methocarbamol (ROBAXIN-750) 750 MG tablet, Take 1 tablet (750 mg total) by mouth every 8 (eight) hours as needed for muscle spasms., Disp: 30 tablet, Rfl: 0   [START ON 07/14/2023] nicotine (NICODERM CQ) 14 mg/24hr patch, Place 1 patch (14 mg total) onto the skin daily., Disp: 28 patch, Rfl: 0   nicotine (NICODERM CQ) 21 mg/24hr patch, Place 1 patch (21 mg total) onto the skin daily., Disp: 28 patch, Rfl: 0   nicotine (NICODERM CQ) 7 mg/24hr patch, Place 1 patch (7 mg total) onto the skin daily., Disp: 28 patch, Rfl: 0   omeprazole (PRILOSEC)  40 MG capsule, Take 1 capsule (40 mg total) by mouth daily., Disp: 30 capsule, Rfl: 5   ondansetron (ZOFRAN-ODT) 4 MG disintegrating tablet, Take 4 mg by mouth every 8 (eight) hours as needed., Disp: , Rfl:    ONETOUCH VERIO test strip, USE AS DIRECTED 2 TIMES DAILY, Disp: 200 strip, Rfl: 3   rosuvastatin (CRESTOR) 20 MG tablet, TAKE 1 TABLET DAILY, Disp: 90 tablet, Rfl: 1   temazepam (RESTORIL) 15 MG capsule, Take 1 capsule (15 mg total) by mouth at bedtime as needed for sleep., Disp: 90 capsule, Rfl: 0   traZODone (DESYREL) 100 MG  tablet, TAKE 1 TABLET BY MOUTH EVERYDAY AT BEDTIME, Disp: 90 tablet, Rfl: 1   triamterene-hydrochlorothiazide (DYAZIDE) 37.5-25 MG capsule, TAKE 1 CAPSULE EVERY       MORNING, Disp: 90 capsule, Rfl: 1   UNABLE TO FIND, CPAP tubing and supplies, face mask Dx: G47.30, Disp: 1 each, Rfl: 0   venlafaxine XR (EFFEXOR-XR) 75 MG 24 hr capsule, TAKE 1 CAPSULE DAILY, Disp: 90 capsule, Rfl: 1   Allergies: Allergies  Allergen Reactions   Penicillins Swelling    Has patient had a PCN reaction causing immediate rash, facial/tongue/throat swelling, SOB or lightheadedness with hypotension: Yes Has patient had a PCN reaction causing severe rash involving mucus membranes or skin necrosis: No Has patient had a PCN reaction that required hospitalization: No  Has patient had a PCN reaction occurring within the last 10 years: No If all of the above answers are "NO", then may proceed with Cephalosporin use.   Of face and tongue.   Metformin And Related Diarrhea and Nausea Only    Pt to discontinue med due to intolerance, cramps and diarrhea   Wound Dressing Adhesive Itching and Rash    Dermabond and other surgical glues for incisions   Jardiance [Empagliflozin] Other (See Comments)    Yeast infection     Statins Other (See Comments)    Fell bad but on Crestor   Zetia [Ezetimibe] Other (See Comments)    Feels funny    REVIEW OF SYSTEMS:   Review of Systems  Constitutional:  Negative for chills, fatigue and fever.  HENT:   Negative for lump/mass, mouth sores, nosebleeds, sore throat and trouble swallowing.   Eyes:  Negative for eye problems.  Respiratory:  Positive for cough and shortness of breath.   Cardiovascular:  Positive for palpitations. Negative for chest pain and leg swelling.  Gastrointestinal:  Positive for constipation, diarrhea, nausea and vomiting. Negative for abdominal pain.  Genitourinary:  Negative for bladder incontinence, difficulty urinating, dysuria, frequency, hematuria and  nocturia.        +possible UTI  Musculoskeletal:  Negative for arthralgias, back pain, flank pain, myalgias and neck pain.  Skin:  Negative for itching and rash.  Neurological:  Positive for dizziness, headaches (9/10 severity) and numbness (tingling in right foot).  Hematological:  Does not bruise/bleed easily.  Psychiatric/Behavioral:  Positive for depression and sleep disturbance. Negative for suicidal ideas. The patient is not nervous/anxious.   All other systems reviewed and are negative.    VITALS:   Blood pressure 90/61, pulse 89, temperature 98.1 F (36.7 C), temperature source Oral, resp. rate 18, height 5\' 6"  (1.676 m), weight 185 lb 9.6 oz (84.2 kg), SpO2 98%.  Wt Readings from Last 3 Encounters:  05/31/23 185 lb 9.6 oz (84.2 kg)  05/19/23 185 lb 1.3 oz (84 kg)  05/19/23 185 lb (83.9 kg)    Body mass index is 29.96  kg/m.  Performance status (ECOG): 1 - Symptomatic but completely ambulatory  PHYSICAL EXAM:   Physical Exam Vitals and nursing note reviewed. Exam conducted with a chaperone present.  Constitutional:      Appearance: Normal appearance.  Cardiovascular:     Rate and Rhythm: Normal rate and regular rhythm.     Pulses: Normal pulses.     Heart sounds: Normal heart sounds.  Pulmonary:     Effort: Pulmonary effort is normal.     Breath sounds: Normal breath sounds.  Abdominal:     Palpations: Abdomen is soft. There is no hepatomegaly, splenomegaly or mass.     Tenderness: There is abdominal tenderness in the left upper quadrant.     Hernia: A hernia (LLQ) is present.  Musculoskeletal:     Right lower leg: No edema.     Left lower leg: No edema.  Lymphadenopathy:     Cervical: No cervical adenopathy.     Right cervical: No superficial, deep or posterior cervical adenopathy.    Left cervical: No superficial, deep or posterior cervical adenopathy.     Upper Body:     Right upper body: No supraclavicular or axillary adenopathy.     Left upper body: No  supraclavicular or axillary adenopathy.  Neurological:     General: No focal deficit present.     Mental Status: She is alert and oriented to person, place, and time.  Psychiatric:        Mood and Affect: Mood normal.        Behavior: Behavior normal.    LABS:      Latest Ref Rng & Units 05/31/2023    2:08 PM 05/19/2023    4:02 PM 06/02/2022    1:56 PM  CBC  WBC 4.0 - 10.5 K/uL 14.9  13.9  10.9   Hemoglobin 12.0 - 15.0 g/dL 14.7  82.9  56.2   Hematocrit 36.0 - 46.0 % 47.0  45.5  43.0   Platelets 150 - 400 K/uL 376  373  388       Latest Ref Rng & Units 05/31/2023    2:08 PM 05/19/2023    4:02 PM 09/14/2022    4:16 PM  CMP  Glucose 70 - 99 mg/dL 130  865  784   BUN 8 - 23 mg/dL 17  18  15    Creatinine 0.44 - 1.00 mg/dL 6.96  2.95  2.84   Sodium 135 - 145 mmol/L 136  140  142   Potassium 3.5 - 5.1 mmol/L 3.8  4.5  4.3   Chloride 98 - 111 mmol/L 102  100  101   CO2 22 - 32 mmol/L 26  25  25    Calcium 8.9 - 10.3 mg/dL 9.3  13.2  44.0   Total Protein 6.5 - 8.1 g/dL 7.5  7.3  7.3   Total Bilirubin 0.3 - 1.2 mg/dL 0.9  0.6  0.4   Alkaline Phos 38 - 126 U/L 71  92  100   AST 15 - 41 U/L 24  21  20    ALT 0 - 44 U/L 25  16  18       Lab Results  Component Value Date   CEA 1.1 02/01/2014   /  CEA  Date Value Ref Range Status  02/01/2014 1.1 0.0 - 5.0 ng/mL Final    Comment:    Performed at [facility]   No results found for: "PSA1" No results found for: "NUU725" No results found for: "  NGE952"  No results found for: "TOTALPROTELP", "ALBUMINELP", "A1GS", "A2GS", "BETS", "BETA2SER", "GAMS", "MSPIKE", "SPEI" Lab Results  Component Value Date   FERRITIN 98 11/01/2019   Lab Results  Component Value Date   LDH 141 05/31/2023   LDH 142 11/04/2021   LDH 142 11/04/2020     STUDIES:   No results found.

## 2023-06-01 ENCOUNTER — Other Ambulatory Visit: Payer: Self-pay

## 2023-06-01 DIAGNOSIS — G4733 Obstructive sleep apnea (adult) (pediatric): Secondary | ICD-10-CM

## 2023-06-06 ENCOUNTER — Other Ambulatory Visit: Payer: Self-pay | Admitting: Internal Medicine

## 2023-06-09 ENCOUNTER — Ambulatory Visit (HOSPITAL_COMMUNITY)
Admission: RE | Admit: 2023-06-09 | Discharge: 2023-06-09 | Disposition: A | Discharge status: Home / Self Care | Payer: Medicare HMO | Source: Ambulatory Visit | Attending: Physician Assistant | Admitting: Physician Assistant

## 2023-06-09 DIAGNOSIS — Z122 Encounter for screening for malignant neoplasm of respiratory organs: Secondary | ICD-10-CM | POA: Insufficient documentation

## 2023-06-09 DIAGNOSIS — Z87891 Personal history of nicotine dependence: Secondary | ICD-10-CM | POA: Insufficient documentation

## 2023-06-10 NOTE — Telephone Encounter (Signed)
I have sent the Cpap order to [facility] they are in network with [facility]. I also called Mrs. Crock to let her know where the Cpap order was sent

## 2023-06-13 ENCOUNTER — Other Ambulatory Visit (HOSPITAL_COMMUNITY): Payer: Self-pay | Admitting: Nephrology

## 2023-06-13 DIAGNOSIS — N1831 Chronic kidney disease, stage 3a: Secondary | ICD-10-CM

## 2023-06-13 DIAGNOSIS — E1122 Type 2 diabetes mellitus with diabetic chronic kidney disease: Secondary | ICD-10-CM

## 2023-06-13 NOTE — Telephone Encounter (Addendum)
Patient called in , provider need to have peer to peer for CT

## 2023-06-15 ENCOUNTER — Telehealth: Payer: Self-pay | Admitting: Pulmonary Disease

## 2023-06-15 ENCOUNTER — Encounter: Payer: Self-pay | Admitting: Family Medicine

## 2023-06-15 NOTE — Telephone Encounter (Signed)
Spoke with patient to schedule her 3 month follow up with the [facility].  Patient states she still has not been scheduled for her CPAP Titration study---please advise

## 2023-06-16 NOTE — Telephone Encounter (Signed)
I will follow up on this.  I don't see where the Berkley Harvey was completed previously.

## 2023-06-20 ENCOUNTER — Ambulatory Visit: Payer: Medicare HMO | Admitting: Internal Medicine

## 2023-06-20 ENCOUNTER — Other Ambulatory Visit: Payer: Medicare HMO

## 2023-06-20 ENCOUNTER — Other Ambulatory Visit: Payer: Self-pay | Admitting: Family Medicine

## 2023-06-20 ENCOUNTER — Encounter: Payer: Self-pay | Admitting: Internal Medicine

## 2023-06-20 VITALS — BP 102/60 | HR 85 | Ht 66.0 in | Wt 185.2 lb

## 2023-06-20 DIAGNOSIS — E782 Mixed hyperlipidemia: Secondary | ICD-10-CM

## 2023-06-20 DIAGNOSIS — Z794 Long term (current) use of insulin: Secondary | ICD-10-CM | POA: Diagnosis not present

## 2023-06-20 DIAGNOSIS — R1904 Left lower quadrant abdominal swelling, mass and lump: Secondary | ICD-10-CM

## 2023-06-20 DIAGNOSIS — R112 Nausea with vomiting, unspecified: Secondary | ICD-10-CM

## 2023-06-20 DIAGNOSIS — E278 Other specified disorders of adrenal gland: Secondary | ICD-10-CM

## 2023-06-20 DIAGNOSIS — R1032 Left lower quadrant pain: Secondary | ICD-10-CM

## 2023-06-20 DIAGNOSIS — E1165 Type 2 diabetes mellitus with hyperglycemia: Secondary | ICD-10-CM

## 2023-06-20 DIAGNOSIS — K439 Ventral hernia without obstruction or gangrene: Secondary | ICD-10-CM

## 2023-06-20 DIAGNOSIS — Z7985 Long-term (current) use of injectable non-insulin antidiabetic drugs: Secondary | ICD-10-CM

## 2023-06-20 LAB — POCT GLYCOSYLATED HEMOGLOBIN (HGB A1C): Hemoglobin A1C: 6.5 % — AB (ref 4.0–5.6)

## 2023-06-20 NOTE — Patient Instructions (Addendum)
Please continue: - Tresiba 26 units daily - Trulicity 1.5 mg weekly  Try to start the CGM.  Please return in 4 months with your sugar log.

## 2023-06-20 NOTE — Progress Notes (Signed)
Patient ID: Annette [name], female   DOB: 08-24-1961, 62 y.o.   MRN: [id number]   HPI: Annette [name] is a 62 y.o.-year-old female, initially referred by her PCP, Dr. Lodema [name], returning for follow-up for DM2, dx in 2018, insulin-dependent since 03/2019, uncontrolled, without long-term complications.  She previously saw Dr. Fransico [name] and Dr. Talmage [name] but then switched to seeing me.  Last visit 4 months ago.   He is here with her husband who offers part of the history.  Interim history: She continues to have some nausea. She has blurry vision, but no increased urination, chest pain.  Reviewed HbA1c levels: Lab Results  Component Value Date   HGBA1C 6.8 (A) 02/17/2023   HGBA1C 7.4 (A) 10/19/2022   HGBA1C 7.1 (A) 06/14/2022   HGBA1C 6.9 (A) 02/08/2022   HGBA1C 6.7 (H) 01/26/2022   HGBA1C 5.9 (A) 05/19/2021   HGBA1C 6.0 (H) 01/17/2021   HGBA1C 6.4 (H) 12/03/2020   HGBA1C 6.6 (A) 09/29/2020   HGBA1C 7.0 (A) 06/24/2020  06/14/2022: HbA1c calculated from fructosamine normal, at 6.27%, correlating better with blood sugar checks at home.  Previously on: - Glipizide XL 20 mg before breakfast - Lantus 50 units at bedtime (dose increased 04/08/2020) She could not tolerate Metformin due to GI intolerance - abdominal pain and diarrhea. She had vaginal yeast infections and UTIs from Annette [name]. Previously on Tresiba, but unclear why changed to Lantus.  Currently on: - Trulicity 0.75 >> 1.5 mg weekly - started 04/2020 -tolerated well - Tresiba 50 >> ... 26 units daily We stopped glipizide XL 01/2021.  Pt checks her sugars twice a day: - am:  93-134, 144 >> 130s-155 >> 96-146 >> 97-120 - 2h after b'fast: n/c - before lunch: n/c >> 120 >> n/c - 2h after lunch: n/c >> 64 >> n/c - before dinner: 62, 65, 70-176, 199 >> 84-139 >> 90s-120s >> n/c - 2h after dinner:  125-150 >> n/c >> 83-145, 154 >> <130s - bedtime: n/c >> 94 >> n/c - nighttime: n/c Lowest sugar was 43 (am) >> 67 >> 62 >> 68 >> 83 >>  59; she has hypoglycemia awareness in the 90s.  Highest sugar was 425 ... >> 176 >> 240s >> 130s  Glucometer: One Touch verio  She is again only eating 1 meal a day per husband's report She only drinks unsweetened tea and coffee. She eats a Healthy Choice for lunch and occasionally dinner.  -She has no history of CKD, last BUN/creatinine:  Lab Results  Component Value Date   BUN 17 05/31/2023   BUN 18 05/19/2023   CREATININE 1.04 (H) 05/31/2023   CREATININE 1.23 (H) 05/19/2023   Lab Results  Component Value Date   MICRALBCREAT 18 09/14/2022   MICRALBCREAT <8 04/23/2021   MICRALBCREAT <6.0 09/16/2017   MICRALBCREAT 2.1 11/24/2011  Not on ACE inhibitor/ARB.  + HL; last set of lipids: Lab Results  Component Value Date   CHOL 132 05/19/2023   HDL 54 05/19/2023   LDLCALC 59 05/19/2023   TRIG 100 05/19/2023   CHOLHDL 2.4 05/19/2023  On Crestor 20. She was also on Zetia but she did not feel well on it >> stopped.  - last eye exam was in 01/2023: No DR reportedly.  At last visit she described occasional double vision >> glasses were too strong.  I referred her to [facility].  - no numbness but some pain and tingling in her right foot -after her back surgery.  Last foot exam 02/17/2023.  Pt has FH of DM in mother, brother, sister.  She also has a history of adrenal adenomas: - she had previous adrenalectomy by Dr. 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   Received labeled "left adrenal gland" is a 30 g adrenal gland  which is received transected by the surgeon which, when  reapproximated has an overall measurement of 8.5 x 4.5 x 2.8 cm.  The cut surface reveals a central well-circumscribed, yellow  cortical nodule measuring 4 x 3.5 x 2.8 cm. The nodule is focally  compressing the adrenal gland. Within the surrounding soft tissue  there  is a 0.3 cm yellow nodule which has a similar appearance to  the cortical nodule representative sections are submitted as  follows:   -  she was found to have 2 adrenal adenomas, which is investigated by oncology.  They had negative Hounsfield units on CT, consistent with benign nodules.  Hormonal investigation was negative: Component     Latest Ref Rng 09/07/2021 09/09/2021 11/02/2021  Epinephrine, Rand Ur     Undefined ug/L  1    Epinephrine, U, 24Hr     0 - 20 ug/24 hr  2    Norepinephrine, Rand Ur     Undefined ug/L  19    Norepinephrine,U,24H     0 - 135 ug/24 hr  29    Dopamine, Rand Ur     Undefined ug/L  104    Dopamine, Ur, 24Hr     0 - 510 ug/24 hr  156    Total Volume  1,500    Total Volume  1,500    Normetanephrine, Ur     Undefined ug/L  142    Normetanephr.,U,24h     131 - 612 ug/24 hr  213    Metanephrine, Ur     Undefined ug/L  26    Metanephrines, 24H Ur     36 - 209 ug/24 hr  39    Norepinephrine     0 - 874 pg/mL 745     Epinephrine     0 - 62 pg/mL 19     Dopamine     0 - 48 pg/mL 40     ALDOSTERONE      ng/dL   27   Renin Activity     0.25 - 5.82 ng/mL/h   13.41 (H)   ALDO / PRA Ratio     0.9 - 28.9 Ratio   2.0   Normetanephrine, Pl     0.0 - 285.2 pg/mL 106.9     Metanephrine, Pl     0.0 - 88.0 pg/mL 10.9     C206 ACTH     6 - 50 pg/mL 4.2 (L)   23   Cortisol, Plasma     ug/dL 6.6   72.5   Cortisol, Plasma        37.2   Cortisol, Plasma        24.4   Potassium     3.5 - 5.1 mEq/L   3.8    Dexamethasone suppression test was normal: Component     Latest Ref Rng 12/03/2021  Dexamethasone, Serum     ng/dL 366   Cortisol - AM     mcg/dL 2.6 (L)     Previous investigation was also negative: Component     Latest Ref Rng 08/17/2011  Total Volume - CF 24Hr U     mL 1300   Epinephrine, 24 hr Urine     2 - 24 mcg/24 h 4   Norepinephrine, 24 hr Ur     15 - 100 mcg/24 h 39   Calculated Total (E+NE)     26 - 121 mcg/24 h 43   Dopamine,  24 hr Urine     52 - 480 mcg/24 h 139   Creatinine, Urine mg/day-CATEUR     0.63 - 2.50 g/24 h 1.34   Normetanephrine, Ur     Undefined ug/L   Normetanephr.,U,24h     131 - 612 ug/24 hr 318   Metanephrine, Ur     Undefined ug/L 422   Metanephrines, 24H Ur     36 - 209 ug/24 hr   Metanephrines, Ur     90 - 315 mcg/24 h 104    Component     Latest Ref Rng 08/17/2011  Cortisol, Plasma     ug/dL 16.1   Aldosterone, Serum      ng/dL 23    09/60/4540: Component Ref Range & Units 10 yr ago  Total Volume - CF 24Hr U mL 1300   Epinephrine, 24 hr Urine 2 - 24 mcg/24 h 4   Norepinephrine, 24 hr Ur 15 - 100 mcg/24 h 39   Calculated Total (E+NE) 26 - 121 mcg/24 h 43   Dopamine, 24 hr Urine 52 - 480 mcg/24 h 139   Creatinine, Urine mg/day-CATEUR 0.63 - 2.50 g/24 h 1.34   Resulting Agency  [facility]   Component Ref Range & Units 10 yr ago  Metanephrines, Ur 90 - 315 mcg/24 h 104   Comment: This specimen was submitted with a pH greater than 6.0. Optimum pH for this assay is 1.0-6.0. Improper preservation may compromise the validity of the assay.  Normetanephrine, 24H Ur 122 - 676 mcg/24 h 318   Metaneph Total, Ur 224 - 832 mcg/24 h 422   Comment: A four fold elevation of urinary normetanephrines is extremely likely to be due to a tumor, while a four fold elevation of urinary metanephrines is highly suggestive, but not diagnostic of the tumor. Measurement of plasma Metanephrines and Chromogranin A is recommended for confirmation.  Resulting Agency  [facility]   12/22/2009: Cortisol,F,ug/L,U Undefined ug/L 20   Cortisol, 24H Ur 0-50 ug/24 hr 50    She also has a history of HTN, OSA. She had COVID-19 10/2019. She recovered well afterwards.  She also has a history of partial thyroidectomy. Reviewed latest TSH: Lab Results  Component Value Date   TSH 1.700 05/19/2023   She had back surgery (12/2020).  She developed significant leg pain afterwards which slowly improved.  ROS: +  See HPI  I reviewed pt's medications, allergies, PMH, social hx, family hx, and changes were documented in the history of present illness. Otherwise, unchanged from my initial visit note.  Past Medical History:  Diagnosis Date   Acute blood loss anemia 02/16/2012   Arthritis    Phreesia 10/14/2020   Blood transfusion without reported diagnosis    [employer]reesia 10/14/2020   Breast cancer (HCC) 2007   History of  XRT, onTamoxifen   Bruises easily    Cancer (HCC)    Phreesia 10/14/2020   Depression    Depression    [employer]reesia 10/14/2020   Diabetes mellitus    prediabetic   Diabetes mellitus without complication (HCC)    Phreesia 10/14/2020   GERD (gastroesophageal reflux disease)  Hypertriglyceridemia    Hypothyroidism    following chemo amnd radiaition for breast cancer, needed replacement short term   Hypothyroidism (acquired)    replaced x 1 year   IBS (irritable bowel syndrome)    Menieres disease    Controlled with triamterene    Migraines    Obesity    Palpitations    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HCC)    Sleep apnea 2010   Problems with CPAP   Past Surgical History:  Procedure Laterality Date   ABDOMINAL EXPOSURE N/A 12/03/2020   Procedure: ABDOMINAL EXPOSURE;  Surgeon: Larina Earthly, MD;  Location: [facility] OR;  Service: Vascular;  Laterality: N/A;   ABDOMINAL HYSTERECTOMY  1998   Benign, fibroids   ADRENALECTOMY  02/16/2012   [facility], splemic trauma, 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   ANTERIOR LUMBAR FUSION N/A 12/03/2020   Procedure: Lumbar Four-Five Lumbar Five Sacral One Anterior lumbar interbody fusion;  Surgeon: Coletta Memos, MD;  Location: [facility] OR;  Service: Neurosurgery;  Laterality: N/A;   APPENDECTOMY  06/12/08   BIOPSY  07/02/2020   Procedure: BIOPSY;  Surgeon: Dolores Frame, MD;  Location: [facility];  Service: Gastroenterology;;   BIOPSY  01/22/2021   Procedure: BIOPSY;  Surgeon: Corbin Ade, MD;  [facility]: [facility];  Service: Endoscopy;;   BIOPSY   02/17/2022   Procedure: BIOPSY;  Surgeon: Malissa Hippo, MD;  [facility]: [facility];  Service: Endoscopy;;   BREAST LUMPECTOMY Left 2008   BREAST RECONSTRUCTION     Left reconstructive   BREAST SURGERY  2007   Left lumpectomy   BREAST SURGERY     Mammosite - right side   CHOLECYSTECTOMY  1985   COLONOSCOPY WITH PROPOFOL N/A 07/02/2020   Procedure: COLONOSCOPY WITH PROPOFOL;  Surgeon: Dolores Frame, MD;  [facility]: [facility];  Service: Gastroenterology;  Laterality: N/A;  1045   COLONOSCOPY WITH PROPOFOL N/A 04/01/2021   Procedure: COLONOSCOPY WITH PROPOFOL;  Surgeon: Dolores Frame, MD;  [facility];  Service: Gastroenterology;  Laterality: N/A;  12:50   ESOPHAGEAL DILATION  12/19/2015   Procedure: ESOPHAGEAL DILATION;  Surgeon: Malissa Hippo, MD;  [facility];  Service: Endoscopy;;   ESOPHAGOGASTRODUODENOSCOPY N/A 12/19/2015   Procedure: ESOPHAGOGASTRODUODENOSCOPY (EGD);  Surgeon: Malissa Hippo, MD;  [facility];  Service: Endoscopy;  Laterality: N/A;  11:40   ESOPHAGOGASTRODUODENOSCOPY (EGD) WITH PROPOFOL N/A 02/17/2022   Procedure: ESOPHAGOGASTRODUODENOSCOPY (EGD) WITH PROPOFOL;  Surgeon: Malissa Hippo, MD;  [facility];  Service: Endoscopy;  Laterality: N/A;  1045   FLEXIBLE SIGMOIDOSCOPY N/A 01/22/2021   Procedure: FLEXIBLE SIGMOIDOSCOPY;  Surgeon: Corbin Ade, MD;  [facility];  Service: Endoscopy;  Laterality: N/A;   HEMOSTASIS CLIP PLACEMENT  04/01/2021   Procedure: HEMOSTASIS CLIP PLACEMENT;  Surgeon: Dolores Frame, MD;  [facility];  Service: Gastroenterology;;  x2   POLYPECTOMY  07/02/2020   Procedure: POLYPECTOMY;  Surgeon: Dolores Frame, MD;  [facility];  Service: Gastroenterology;;   POLYPECTOMY  04/01/2021   Procedure: POLYPECTOMY INTESTINAL;  Surgeon: Dolores Frame, MD;  [facility];  Service: Gastroenterology;;    SPLENECTOMY, 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   THYROIDECTOMY, PARTIAL  11/05/2010   Benign disease   Social History   Socioeconomic History   Marital status: Married    Spouse name: Clovis Riley   Number of children: 2   Years of education:  Not on file   Highest education level: Not on file  Occupational History   Not on file  Tobacco Use   Smoking status: Every Day    Current packs/day: 0.50    Average packs/day: 0.5 packs/day for 40.0 years (20.0 ttl pk-yrs)    Types: Cigarettes   Smokeless tobacco: Never   Tobacco comments:    1/2 pack a day    02/15/2023 Patient smokes about 1/2 pack daily  Vaping Use   Vaping status: Never Used  Substance and Sexual Activity   Alcohol use: No    Alcohol/week: 0.0 standard drinks of alcohol    Comment: Encouraged to quit smoking. She has tried the nicotone gum and patches but did not help   Drug use: No   Sexual activity: Yes    Birth control/protection: Surgical  Other Topics Concern   Not on file  Social History Narrative   Not on file   Social Determinants of Health   Financial Resource Strain: Medium Risk (11/11/2020)   Overall Financial Resource Strain (CARDIA)    Difficulty of Paying Living Expenses: Somewhat hard  Food Insecurity: No Food Insecurity (11/11/2020)   Hunger Vital Sign    Worried About Running Out of Food in the Last Year: Never true    Ran Out of Food in the Last Year: Never true  Transportation Needs: No Transportation Needs (11/11/2020)   PRAPARE - Administrator, Civil Service (Medical): No    Lack of Transportation (Non-Medical): No  Physical Activity: Inactive (11/11/2020)   Exercise Vital Sign    Days of Exercise per Week: 0 days    Minutes of Exercise per Session: 0 min  Stress: Stress Concern Present (11/11/2020)   [employer] of Occupational Health - Occupational Stress Questionnaire    Feeling of Stress : To some extent  Social Connections:  Moderately Integrated (11/11/2020)   Social Connection and Isolation Panel [NHANES]    Frequency of Communication with Friends and Family: Three times a week    Frequency of Social Gatherings with Friends and Family: Three times a week    Attends Religious Services: More than 4 times per year    Active Member of Clubs or Organizations: No    Attends Banker Meetings: Never    Marital Status: Married  Catering manager Violence: Not At Risk (11/11/2020)   Humiliation, Afraid, Rape, and Kick questionnaire    Fear of Current or Ex-Partner: No    Emotionally Abused: No    Physically Abused: No    Sexually Abused: No   Current Outpatient Medications on File Prior to Visit  Medication Sig Dispense Refill   albuterol (VENTOLIN HFA) 108 (90 Base) MCG/ACT inhaler TAKE 2 PUFFS BY MOUTH EVERY 6 HOURS AS NEEDED FOR WHEEZE OR SHORTNESS OF BREATH     azithromycin (ZITHROMAX) 250 MG tablet Take 1 tablet (250 mg total) by mouth daily. 6 tablet 0   butalbital-acetaminophen-caffeine (FIORICET) 50-325-40 MG tablet Take one tablet by mouth every 8  to 12 hours , as needed, for uncontrolled headache 20 tablet 0   Continuous Glucose Sensor (FREESTYLE LIBRE 3 SENSOR) MISC 1 each by Does not apply route every 14 (fourteen) days. 6 each 3   Dulaglutide (TRULICITY) 1.5 MG/0.5ML SOPN INJECT 1.5MG  SUBCUTANEOUSLY ONCE A WEEK 6 mL 1   fluticasone (FLONASE) 50 MCG/ACT nasal spray SPRAY 2 SPRAYS INTO EACH NOSTRIL EVERY DAY 48 mL 2   insulin degludec (TRESIBA FLEXTOUCH) 200 UNIT/ML FlexTouch Pen Inject 26  Units into the skin at bedtime. 18 mL 1   Insulin Pen Needle (BD PEN NEEDLE NANO 2ND GEN) 32G X 4 MM MISC USE 4X DAILY AS DIRECTED 400 each 1   ketorolac (TORADOL) 10 MG tablet Take 1 tablet by mouth every 6 (six) hours as needed.     Lancets (ONETOUCH DELICA PLUS LANCET33G) MISC USE TO TEST 4 TIMES A DAY 100 each 7   LORazepam (ATIVAN) 1 MG tablet Take one tablet by mouth once daily, as needed, for uncontrolled  nausea 30 tablet 1   methocarbamol (ROBAXIN-750) 750 MG tablet Take 1 tablet (750 mg total) by mouth every 8 (eight) hours as needed for muscle spasms. 30 tablet 0   [START ON 07/14/2023] nicotine (NICODERM CQ) 14 mg/24hr patch Place 1 patch (14 mg total) onto the skin daily. 28 patch 0   nicotine (NICODERM CQ) 21 mg/24hr patch Place 1 patch (21 mg total) onto the skin daily. 28 patch 0   nicotine (NICODERM CQ) 7 mg/24hr patch Place 1 patch (7 mg total) onto the skin daily. 28 patch 0   omeprazole (PRILOSEC) 40 MG capsule Take 1 capsule (40 mg total) by mouth daily. 30 capsule 5   ondansetron (ZOFRAN-ODT) 4 MG disintegrating tablet Take 4 mg by mouth every 8 (eight) hours as needed.     ONETOUCH VERIO test strip USE AS DIRECTED 2 TIMES DAILY 200 strip 3   rosuvastatin (CRESTOR) 20 MG tablet TAKE 1 TABLET DAILY 90 tablet 1   temazepam (RESTORIL) 15 MG capsule Take 1 capsule (15 mg total) by mouth at bedtime as needed for sleep. 90 capsule 0   traZODone (DESYREL) 100 MG tablet TAKE 1 TABLET BY MOUTH EVERYDAY AT BEDTIME 90 tablet 1   triamterene-hydrochlorothiazide (DYAZIDE) 37.5-25 MG capsule TAKE 1 CAPSULE EVERY       MORNING 90 capsule 1   UNABLE TO FIND CPAP tubing and supplies, face mask Dx: G47.30 1 each 0   venlafaxine XR (EFFEXOR-XR) 75 MG 24 hr capsule TAKE 1 CAPSULE DAILY 90 capsule 1   No current facility-administered medications on file prior to visit.   Allergies  Allergen Reactions   Penicillins Swelling    Has patient had a PCN reaction causing immediate rash, facial/tongue/throat swelling, SOB or lightheadedness with hypotension: Yes Has patient had a PCN reaction causing severe rash involving mucus membranes or skin necrosis: No Has patient had a PCN reaction that required hospitalization: No  Has patient had a PCN reaction occurring within the last 10 years: No If all of the above answers are "NO", then may proceed with Cephalosporin use.   Of face and tongue.   Metformin  And Related Diarrhea and Nausea Only    Pt to discontinue med due to intolerance, cramps and diarrhea   Wound Dressing Adhesive Itching and Rash    Dermabond and other surgical glues for incisions   Jardiance [Empagliflozin] Other (See Comments)    Yeast infection     Statins Other (See Comments)    Fell bad but on Crestor   Zetia [Ezetimibe] Other (See Comments)    Feels funny   Family History  Problem Relation Age of Onset   Diabetes Mother    Arrhythmia Mother    Heart disease Father    Hyperlipidemia Father    Arrhythmia Father    Cancer Paternal Grandmother        Breast   Diabetes Sister    Diabetes Brother    Heart disease Brother  PE: BP 102/60   Pulse 85   Ht 5\' 6"  (1.676 m)   Wt 185 lb 3.2 oz (84 kg)   SpO2 96%   BMI 29.89 kg/m  Wt Readings from Last 3 Encounters:  06/20/23 185 lb 3.2 oz (84 kg)  05/31/23 185 lb 9.6 oz (84.2 kg)  05/19/23 185 lb 1.3 oz (84 kg)   Constitutional: overweight, in NAD Eyes: EOMI, no exophthalmos ENT: no thyromegaly, no cervical lymphadenopathy Cardiovascular: RRR, No MRG Respiratory: CTA B Musculoskeletal: no deformities Skin:  no rashes Neurological: no tremor with outstretched hands  ASSESSMENT: 1. DM2, insulin-dependent, uncontrolled, without long-term complications, but with hyperglycemia  No family history of medullary thyroid cancer or personal history of pancreatitis.  2. HL  3.  Adrenal adenoma  PLAN:  1. Patient with longstanding, uncontrolled, type 2 diabetes, on injectable antidiabetic regimen with long-acting insulin and weekly GLP-1 receptor agonist, with improving control.  At last visit, HbA1c was 6.8%, decreased from 7.4%. Sugars were mostly at goal with only occasional higher blood sugars before meals.  She was not checking after meals and I advised her to start checking at least after dinner.  She was interested in a CGM and I gave her a written prescription for the freestyle libre 3 CGM to fill from  the pharmacy after her Medicare was starting 05/05/2023.  We did not change her regimen at that time. -At today's visit, she has a prescription for the CGM but did not pick it up from the pharmacy.  I advised her to do so.  Discussed about the advantages of having a CGM.  I also advised her to check with her glucometer if she sees unusual blood sugars on the CGM.   -As of now, her blood sugars are at goal reportedly.  No hypo or hyperglycemic values.  No need to change her regimen. - I suggested to:  Patient Instructions  Please continue: - Tresiba 26 units daily - Trulicity 1.5 mg weekly  Please return in 4 months with your sugar log.   - we checked her HbA1c: 6.5% (lower) - advised to check sugars at different times of the day - 4x a day, rotating check times - advised for yearly eye exams >> she is UTD - return to clinic in 4 months  2. HL -Reviewed latest lipid panel from 05/2023: Fractions at goal: Lab Results  Component Value Date   CHOL 132 05/19/2023   HDL 54 05/19/2023   LDLCALC 59 05/19/2023   TRIG 100 05/19/2023   CHOLHDL 2.4 05/19/2023  -She continues Crestor 20 mg daily without side effects  3.  Adrenal adenoma -Labs were repeatedly normal -Reviewed previous adrenal pathology from her surgery with Dr. Lady [name]: Benign adenomas.  This is likely a bilateral process for her. -Adrenal hormone testing from 2011 on was reviewed and this was normal -Latest investigation was in 2023 when we checked aldosterone, cortisol, plasma metanephrines and catecholamines and they were all normal.  A dexamethasone suppression test was also normal.  -Will continue to follow her without intervention for now  Carlus Pavlov, MD PhD [facility]

## 2023-06-21 ENCOUNTER — Other Ambulatory Visit: Payer: Self-pay

## 2023-06-21 ENCOUNTER — Ambulatory Visit (HOSPITAL_COMMUNITY)
Admission: RE | Admit: 2023-06-21 | Discharge: 2023-06-21 | Disposition: A | Discharge status: Home / Self Care | Payer: Medicare HMO | Source: Ambulatory Visit | Attending: Nephrology | Admitting: Nephrology

## 2023-06-21 DIAGNOSIS — E1122 Type 2 diabetes mellitus with diabetic chronic kidney disease: Secondary | ICD-10-CM | POA: Diagnosis present

## 2023-06-21 DIAGNOSIS — N1831 Chronic kidney disease, stage 3a: Secondary | ICD-10-CM | POA: Diagnosis present

## 2023-06-21 DIAGNOSIS — R1904 Left lower quadrant abdominal swelling, mass and lump: Secondary | ICD-10-CM

## 2023-06-21 NOTE — Addendum Note (Signed)
Addended by: Pollie Meyer on: 06/21/2023 10:33 AM   Modules accepted: Orders

## 2023-06-24 ENCOUNTER — Other Ambulatory Visit: Payer: Self-pay

## 2023-06-24 DIAGNOSIS — E7800 Pure hypercholesterolemia, unspecified: Secondary | ICD-10-CM

## 2023-06-24 MED ORDER — ROSUVASTATIN CALCIUM 20 MG PO TABS: 20.0000 mg | ORAL_TABLET | Freq: Every day | ORAL | 1 refills | Status: DC

## 2023-06-28 ENCOUNTER — Encounter: Payer: Self-pay | Admitting: *Deleted

## 2023-06-29 ENCOUNTER — Ambulatory Visit (HOSPITAL_COMMUNITY): Payer: Medicare HMO

## 2023-07-05 ENCOUNTER — Ambulatory Visit
Admission: RE | Admit: 2023-07-05 | Discharge: 2023-07-05 | Disposition: A | Discharge status: Home / Self Care | Payer: Medicare HMO | Source: Ambulatory Visit | Attending: Family Medicine | Admitting: Family Medicine

## 2023-07-05 DIAGNOSIS — R1904 Left lower quadrant abdominal swelling, mass and lump: Secondary | ICD-10-CM

## 2023-07-05 DIAGNOSIS — R1032 Left lower quadrant pain: Secondary | ICD-10-CM

## 2023-07-05 DIAGNOSIS — K439 Ventral hernia without obstruction or gangrene: Secondary | ICD-10-CM

## 2023-07-05 DIAGNOSIS — R112 Nausea with vomiting, unspecified: Secondary | ICD-10-CM

## 2023-07-05 MED ORDER — IOPAMIDOL (ISOVUE-300) INJECTION 61%
500.0000 mL | Freq: Once | INTRAVENOUS | Status: AC | PRN
  Administered 2023-07-05: 100 mL via INTRAVENOUS

## 2023-07-07 ENCOUNTER — Ambulatory Visit: Payer: Medicare HMO | Admitting: Surgery

## 2023-07-14 ENCOUNTER — Ambulatory Visit: Payer: Medicare HMO | Admitting: Surgery

## 2023-07-15 ENCOUNTER — Other Ambulatory Visit: Payer: Self-pay | Admitting: *Deleted

## 2023-07-15 DIAGNOSIS — K469 Unspecified abdominal hernia without obstruction or gangrene: Secondary | ICD-10-CM

## 2023-07-26 ENCOUNTER — Ambulatory Visit: Payer: Medicare HMO | Admitting: Surgery

## 2023-08-15 ENCOUNTER — Encounter: Payer: Self-pay | Admitting: Family Medicine

## 2023-08-18 ENCOUNTER — Other Ambulatory Visit: Payer: Self-pay | Admitting: Family Medicine

## 2023-08-18 DIAGNOSIS — E7800 Pure hypercholesterolemia, unspecified: Secondary | ICD-10-CM

## 2023-08-19 ENCOUNTER — Other Ambulatory Visit: Payer: Self-pay | Admitting: Family Medicine

## 2023-08-19 DIAGNOSIS — F5104 Psychophysiologic insomnia: Secondary | ICD-10-CM

## 2023-08-24 ENCOUNTER — Telehealth: Payer: Self-pay | Admitting: *Deleted

## 2023-08-24 ENCOUNTER — Encounter (HOSPITAL_BASED_OUTPATIENT_CLINIC_OR_DEPARTMENT_OTHER): Payer: Self-pay | Admitting: Pulmonary Disease

## 2023-08-24 NOTE — Telephone Encounter (Signed)
Received message from PCP office regarding scheduling consult for incisional hernia.   [facility] providers reviewed prior referral and denied the case. Advised that upon of review of the CT from 07/08/2023, hernia has large defect with abdominal wall laxity. Recommended referring patient to hernia specialist, Dr. Chrisandra Carota at [facility] Patient was made aware and agreeable to plan. [facility] placed referral for specialist.   Patient seen at [facility] on 08/11/2023. Notes available under Care Everywhere.   Patient has requested to be seen by Dr. Lovell Sheehan now. Discussed with Dr. Lovell Sheehan. Advised that provider is happy to see the patient, but [facility] will most likely not be performing a hernia repair due to the complexity of care.   Patient verbalized understanding. Continues to request appointment. Appointment scheduled.

## 2023-08-24 NOTE — Telephone Encounter (Signed)
Per Dr Lovell Sheehan- based on the recent CT, the hernia defect is around 14- 15 cm at the widest. this is more complex than we can do here at [facility].

## 2023-08-24 NOTE — Telephone Encounter (Signed)
Dr York Ram said he would be happy to see her but he will likely not be doing any surgery

## 2023-08-29 ENCOUNTER — Other Ambulatory Visit: Payer: Self-pay | Admitting: Family Medicine

## 2023-08-29 ENCOUNTER — Telehealth: Payer: Self-pay | Admitting: Pulmonary Disease

## 2023-08-29 NOTE — Telephone Encounter (Signed)
Patient called back. I explained titration study to patient and she is happy with going ahead as scheduled.

## 2023-08-29 NOTE — Telephone Encounter (Signed)
Patient states she completed a Home Sleep study and does not know why she is scheduled for another one---please call her at [phone]

## 2023-08-29 NOTE — Telephone Encounter (Signed)
I called and left the patient a message with my direct office number so that I can explain her cpap titration study for 11/29 to her.

## 2023-09-02 ENCOUNTER — Ambulatory Visit (HOSPITAL_BASED_OUTPATIENT_CLINIC_OR_DEPARTMENT_OTHER): Payer: Medicare HMO | Attending: Pulmonary Disease | Admitting: Pulmonary Disease

## 2023-09-02 DIAGNOSIS — G4734 Idiopathic sleep related nonobstructive alveolar hypoventilation: Secondary | ICD-10-CM | POA: Insufficient documentation

## 2023-09-02 DIAGNOSIS — G4733 Obstructive sleep apnea (adult) (pediatric): Secondary | ICD-10-CM | POA: Diagnosis present

## 2023-09-09 ENCOUNTER — Telehealth: Payer: Self-pay | Admitting: Pulmonary Disease

## 2023-09-09 DIAGNOSIS — G4733 Obstructive sleep apnea (adult) (pediatric): Secondary | ICD-10-CM

## 2023-09-09 NOTE — Telephone Encounter (Signed)
Titration study showed CPAP requirement of 9 cm. Please confirm with patient that she had CPAP delivered already and if so sent prescription to change CPAP 9 cm -Otherwise we can send in new prescription. No oxygen was required Please schedule routine follow-up office visit

## 2023-09-09 NOTE — Procedures (Signed)
Patient Name: Annette [name], Annette [name] Date: 09/02/2023 Gender: Female D.O.B: 09-Apr-1961 Age (years): 4 Referring Provider: Cyril Mourning MD, ABSM Height (inches): 66 Interpreting Physician: Cyril Mourning MD, ABSM Weight (lbs): 185 RPSGT: Peak, Robert BMI: 30 MRN: [id number] Neck Size: <br> <br> <br> CLINICAL INFORMATION The patient is referred for a CPAP titration to treat sleep apnea.    HST 05/2023 showed mild  OSA with AHI 9/ hr but oxygen desaturation was severe and oxygen level stayed low for 90% of the study  SLEEP STUDY TECHNIQUE As per the AASM Manual for the Scoring of Sleep and Associated Events v2.3 (April 2016) with a hypopnea requiring 4% desaturations.  The channels recorded and monitored were frontal, central and occipital EEG, electrooculogram (EOG), submentalis EMG (chin), nasal and oral airflow, thoracic and abdominal wall motion, anterior tibialis EMG, snore microphone, electrocardiogram, and pulse oximetry. Continuous positive airway pressure (CPAP) was initiated at the beginning of the study and titrated to treat sleep-disordered breathing.  MEDICATIONS Medications self-administered by patient taken the night of the study : N/A  TECHNICIAN COMMENTS Comments added by technician: Patient was adherent to CPAP pressure. Comments added by scorer: N/A RESPIRATORY PARAMETERS Optimal PAP Pressure (cm): 9 AHI at Optimal Pressure (/hr): 0.3 Overall Minimal O2 (%): 82.0 Supine % at Optimal Pressure (%): 0 Minimal O2 at Optimal Pressure (%): 83.0   SLEEP ARCHITECTURE The study was initiated at 9:25:29 PM and ended at 5:04:05 AM.  Sleep onset time was 79.6 minutes and the sleep efficiency was 71.6%. The total sleep time was 328.5 minutes.  The patient spent 19.3% of the night in stage N1 sleep, 80.7% in stage N2 sleep, 0.0% in stage N3 and 0% in REM.Stage REM latency was N/A minutes  Wake after sleep onset was 50.5. Alpha intrusion was absent. Supine sleep was  17.65%.  CARDIAC DATA The 2 lead EKG demonstrated sinus rhythm. The mean heart rate was 70.8 beats per minute. Other EKG findings include: None.   LEG MOVEMENT DATA The total Periodic Limb Movements of Sleep (PLMS) were 2. The PLMS index was 0.4. A PLMS index of <15 is considered normal in adults.  IMPRESSIONS - The optimal PAP pressure was 9 cm of water. - Central sleep apnea was not noted during this titration (CAI = 0.2/h). - Moderate oxygen desaturations were observed during this titration (min O2 = 82.0%). She spent 45 mins on CPAP 9 cm with sat < 88% - No snoring was audible during this study. - No cardiac abnormalities were observed during this study. - Clinically significant periodic limb movements were not noted during this study. Arousals associated with PLMs were significant.   DIAGNOSIS - Obstructive Sleep Apnea (G47.33)   RECOMMENDATIONS - Trial of CPAP therapy on 9 cm H2O with a Extra Small size Fisher&Paykel Full Face Evora Full mask and heated humidification. - Avoid alcohol, sedatives and other CNS depressants that may worsen sleep apnea and disrupt normal sleep architecture. - Sleep hygiene should be reviewed to assess factors that may improve sleep quality. - Weight management and regular exercise should be initiated or continued. - Return to Sleep Center for re-evaluation after 4 weeks of therapy  [Electronically signed] 09/09/2023 04:31 PM  Cyril Mourning MD, ABSM Diplomate, American Board of Sleep Medicine NPI: [id number]

## 2023-09-13 ENCOUNTER — Encounter (HOSPITAL_BASED_OUTPATIENT_CLINIC_OR_DEPARTMENT_OTHER): Payer: Self-pay

## 2023-09-13 NOTE — Telephone Encounter (Signed)
Patient notified and a new order has been placed to change settings.

## 2023-09-13 NOTE — Telephone Encounter (Signed)
LMOM for patient to return call.

## 2023-09-20 ENCOUNTER — Encounter: Payer: Self-pay | Admitting: General Surgery

## 2023-09-20 ENCOUNTER — Ambulatory Visit: Payer: Medicare HMO | Admitting: General Surgery

## 2023-09-20 VITALS — BP 121/66 | HR 88 | Temp 98.0°F | Resp 14 | Ht 66.0 in | Wt 186.0 lb

## 2023-09-20 DIAGNOSIS — K439 Ventral hernia without obstruction or gangrene: Secondary | ICD-10-CM

## 2023-09-21 NOTE — Progress Notes (Signed)
Annette [name]; [id number]; 1961-06-20   HPI Patient is a 62 year old white female who was referred to my care by Syliva Overman for evaluation and treatment of a ventral hernia.  Patient states she has had the hernia for some time now.  It seems to be increasing in size and does cause her discomfort with sticking out.  It is made worse with straining or standing.  She has had multiple abdominal surgeries including back surgery through an anterior retroperitoneal approach in 2022.  She does have occasional nausea.  She does have multiple other GI complaints including GERD secondary to a hiatal hernia.  Her bowel movements are somewhat regular. Past Medical History:  Diagnosis Date   Acute blood loss anemia 02/16/2012   Arthritis    [employer] 10/14/2020   Blood transfusion without reported diagnosis    [employer] 10/14/2020   Breast cancer (HCC) 2007   History of  XRT, onTamoxifen   Bruises easily    Cancer (HCC)    [employer] 10/14/2020   Depression    Depression    [employer] 10/14/2020   Diabetes mellitus    prediabetic   Diabetes mellitus without complication (HCC)    [employer] 10/14/2020   GERD (gastroesophageal reflux disease)    Hypertriglyceridemia    Hypothyroidism    following chemo amnd radiaition for breast cancer, needed replacement short term   Hypothyroidism (acquired)    replaced x 1 year   IBS (irritable bowel syndrome)    Menieres disease    Controlled with triamterene    Migraines    Obesity    Palpitations    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HCC)    Sleep apnea 2010   Problems with CPAP    Past Surgical History:  Procedure Laterality Date   ABDOMINAL EXPOSURE N/A 12/03/2020   Procedure: ABDOMINAL EXPOSURE;  Surgeon: Larina Earthly, MD;  Location: [facility] OR;  Service: Vascular;  Laterality: N/A;   ABDOMINAL HYSTERECTOMY  1998   Benign, fibroids   ADRENALECTOMY  02/16/2012   [facility], splemic trauma, 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   ANTERIOR LUMBAR FUSION N/A 12/03/2020    Procedure: Lumbar Four-Five Lumbar Five Sacral One Anterior lumbar interbody fusion;  Surgeon: Coletta Memos, MD;  Location: [facility] OR;  Service: Neurosurgery;  Laterality: N/A;   APPENDECTOMY  06/12/08   BIOPSY  07/02/2020   Procedure: BIOPSY;  Surgeon: Dolores Frame, MD;  Location: [facility];  Service: Gastroenterology;;   BIOPSY  01/22/2021   Procedure: BIOPSY;  Surgeon: Corbin Ade, MD;  Location: [facility];  Service: Endoscopy;;   BIOPSY  02/17/2022   Procedure: BIOPSY;  Surgeon: Malissa Hippo, MD;  [facility]: [facility];  Service: Endoscopy;;   BREAST LUMPECTOMY Left 2008   BREAST RECONSTRUCTION     Left reconstructive   BREAST SURGERY  2007   Left lumpectomy   BREAST SURGERY     Mammosite - right side   CHOLECYSTECTOMY  1985   COLONOSCOPY WITH PROPOFOL N/A 07/02/2020   Procedure: COLONOSCOPY WITH PROPOFOL;  Surgeon: Dolores Frame, MD;  [facility]: [facility];  Service: Gastroenterology;  Laterality: N/A;  1045   COLONOSCOPY WITH PROPOFOL N/A 04/01/2021   Procedure: COLONOSCOPY WITH PROPOFOL;  Surgeon: Dolores Frame, MD;  [facility]: [facility];  Service: Gastroenterology;  Laterality: N/A;  12:50   ESOPHAGEAL DILATION  12/19/2015   Procedure: ESOPHAGEAL DILATION;  Surgeon: Malissa Hippo, MD;  [facility];  Service: Endoscopy;;   ESOPHAGOGASTRODUODENOSCOPY N/A 12/19/2015   Procedure:  ESOPHAGOGASTRODUODENOSCOPY (EGD);  Surgeon: Malissa Hippo, MD;  [facility];  Service: Endoscopy;  Laterality: N/A;  11:40   ESOPHAGOGASTRODUODENOSCOPY (EGD) WITH PROPOFOL N/A 02/17/2022   Procedure: ESOPHAGOGASTRODUODENOSCOPY (EGD) WITH PROPOFOL;  Surgeon: Malissa Hippo, MD;  [facility];  Service: Endoscopy;  Laterality: N/A;  1045   FLEXIBLE SIGMOIDOSCOPY N/A 01/22/2021   Procedure: FLEXIBLE SIGMOIDOSCOPY;  Surgeon: Corbin Ade, MD;  [facility];  Service: Endoscopy;  Laterality: N/A;   HEMOSTASIS  CLIP PLACEMENT  04/01/2021   Procedure: HEMOSTASIS CLIP PLACEMENT;  Surgeon: Marguerita Merles, Reuel Boom, MD;  [facility];  Service: Gastroenterology;;  x2   POLYPECTOMY  07/02/2020   Procedure: POLYPECTOMY;  Surgeon: Marguerita Merles, Reuel Boom, MD;  [facility];  Service: Gastroenterology;;   POLYPECTOMY  04/01/2021   Procedure: POLYPECTOMY INTESTINAL;  Surgeon: Marguerita Merles, Reuel Boom, MD;  Location: [facility];  Service: Gastroenterology;;   SPLENECTOMY, 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   THYROIDECTOMY, PARTIAL  11/05/2010   Benign disease    Family History  Problem Relation Age of Onset   Diabetes Mother    Arrhythmia Mother    Heart disease Father    Hyperlipidemia Father    Arrhythmia Father    Cancer Paternal Grandmother        Breast   Diabetes Sister    Diabetes Brother    Heart disease Brother     Current Outpatient Medications on File Prior to Visit  Medication Sig Dispense Refill   albuterol (VENTOLIN HFA) 108 (90 Base) MCG/ACT inhaler TAKE 2 PUFFS BY MOUTH EVERY 6 HOURS AS NEEDED FOR WHEEZE OR SHORTNESS OF BREATH     butalbital-acetaminophen-caffeine (FIORICET) 50-325-40 MG tablet Take one tablet by mouth every 8  to 12 hours , as needed, for uncontrolled headache 20 tablet 0   Continuous Glucose Sensor (FREESTYLE LIBRE 3 SENSOR) MISC 1 each by Does not apply route every 14 (fourteen) days. 6 each 3   Dulaglutide (TRULICITY) 1.5 MG/0.5ML SOPN INJECT 1.5MG  SUBCUTANEOUSLY ONCE A WEEK 6 mL 1   fluticasone (FLONASE) 50 MCG/ACT nasal spray SPRAY 2 SPRAYS INTO EACH NOSTRIL EVERY DAY 48 mL 2   insulin degludec (TRESIBA FLEXTOUCH) 200 UNIT/ML FlexTouch Pen Inject 26 Units into the skin at bedtime. 18 mL 1   Insulin Pen Needle (BD PEN NEEDLE NANO 2ND GEN) 32G X 4 MM MISC USE 4X DAILY AS DIRECTED 400 each 1   ketorolac (TORADOL) 10 MG tablet Take 1 tablet by mouth every 6 (six) hours as needed.     Lancets (ONETOUCH  DELICA PLUS LANCET33G) MISC USE TO TEST 4 TIMES A DAY 100 each 7   LORazepam (ATIVAN) 1 MG tablet Take one tablet by mouth once daily, as needed, for uncontrolled nausea 30 tablet 1   methocarbamol (ROBAXIN-750) 750 MG tablet Take 1 tablet (750 mg total) by mouth every 8 (eight) hours as needed for muscle spasms. 30 tablet 0   omeprazole (PRILOSEC) 40 MG capsule Take 1 capsule (40 mg total) by mouth daily. 30 capsule 5   ondansetron (ZOFRAN-ODT) 4 MG disintegrating tablet Take 4 mg by mouth every 8 (eight) hours as needed.     ONETOUCH VERIO test strip USE AS DIRECTED 2 TIMES DAILY 200 strip 3   rosuvastatin (CRESTOR) 20 MG tablet TAKE 1 TABLET BY MOUTH EVERY DAY 90 tablet 1   temazepam (RESTORIL) 15 MG capsule TAKE 1 CAPSULE AT BEDTIME  AS  NEEDED FOR SLEEP 90 capsule 0   traZODone (DESYREL) 100 MG tablet TAKE 1 TABLET BY MOUTH EVERYDAY AT BEDTIME 90 tablet 1   triamterene-hydrochlorothiazide (DYAZIDE) 37.5-25 MG capsule TAKE 1 CAPSULE EVERY       MORNING 90 capsule 1   UNABLE TO FIND CPAP tubing and supplies, face mask Dx: G47.30 1 each 0   venlafaxine XR (EFFEXOR-XR) 75 MG 24 hr capsule TAKE 1 CAPSULE DAILY 90 capsule 1   No current facility-administered medications on file prior to visit.    Allergies  Allergen Reactions   Penicillins Swelling    Has patient had a PCN reaction causing immediate rash, facial/tongue/throat swelling, SOB or lightheadedness with hypotension: Yes Has patient had a PCN reaction causing severe rash involving mucus membranes or skin necrosis: No Has patient had a PCN reaction that required hospitalization: No  Has patient had a PCN reaction occurring within the last 10 years: No If all of the above answers are "NO", then may proceed with Cephalosporin use.   Of face and tongue.   Metformin And Related Diarrhea and Nausea Only    Pt to discontinue med due to intolerance, cramps and diarrhea   Wound Dressing Adhesive Itching and Rash    Dermabond and other  surgical glues for incisions   Jardiance [Empagliflozin] Other (See Comments)    Yeast infection     Statins Other (See Comments)    Fell bad but on Crestor   Zetia [Ezetimibe] Other (See Comments)    Feels funny    Social History   Substance and Sexual Activity  Alcohol Use No   Alcohol/week: 0.0 standard drinks of alcohol   Comment: Encouraged to quit smoking. She has tried the nicotone gum and patches but did not help    Social History   Tobacco Use  Smoking Status Every Day   Current packs/day: 0.50   Average packs/day: 0.5 packs/day for 40.0 years (20.0 ttl pk-yrs)   Types: Cigarettes  Smokeless Tobacco Never  Tobacco Comments   1/2 pack a day   02/15/2023 Patient smokes about 1/2 pack daily    Review of Systems  Constitutional:  Positive for malaise/fatigue.  HENT: Negative.    Eyes: Negative.   Respiratory:  Positive for cough, shortness of breath and wheezing.   Cardiovascular: Negative.   Gastrointestinal:  Positive for abdominal pain, heartburn and nausea.  Genitourinary: Negative.   Musculoskeletal:  Positive for back pain, joint pain and neck pain.  Skin: Negative.   Neurological:  Positive for dizziness and headaches.  Endo/Heme/Allergies: Negative.   Psychiatric/Behavioral: Negative.      Objective   Vitals:   09/20/23 1347  BP: 121/66  Pulse: 88  Resp: 14  Temp: 98 F (36.7 C)  SpO2: 95%    Physical Exam Vitals reviewed.  Constitutional:      Appearance: Normal appearance. She is not ill-appearing.  HENT:     Head: Normocephalic and atraumatic.  Cardiovascular:     Rate and Rhythm: Normal rate and regular rhythm.     Heart sounds: Normal heart sounds. No murmur heard.    No friction rub. No gallop.  Pulmonary:     Effort: Pulmonary effort is normal. No respiratory distress.     Breath sounds: Normal breath sounds. No stridor. No wheezing, rhonchi or rales.  Abdominal:     General: Bowel sounds are normal. There is no distension.      Palpations: Abdomen is soft. There is no mass.  Tenderness: There is no abdominal tenderness. There is no guarding or rebound.     Hernia: A hernia is present.  Skin:    General: Skin is warm and dry.  Neurological:     Mental Status: She is alert and oriented to person, place, and time.   CT scan images personally reviewed  Assessment  Very large incisional hernia with a significant loss of domain of the abdominal wall.  I told the patient that this was outside my realm of expertise.  There does appear to be attenuation of the abdominal wall.  Risk of incarceration is low. Plan  Will refer patient to [facility] hernia center for further evaluation and treatment.

## 2023-09-21 NOTE — Addendum Note (Signed)
Addended by: Phillips Odor on: 09/21/2023 03:58 PM   Modules accepted: Orders

## 2023-10-07 ENCOUNTER — Other Ambulatory Visit: Payer: Self-pay | Admitting: Family Medicine

## 2023-10-20 ENCOUNTER — Ambulatory Visit: Payer: Medicare HMO | Admitting: Internal Medicine

## 2023-10-20 ENCOUNTER — Encounter: Payer: Self-pay | Admitting: Internal Medicine

## 2023-10-20 ENCOUNTER — Telehealth: Payer: Self-pay

## 2023-10-20 VITALS — BP 112/64 | HR 86 | Ht 66.0 in | Wt 184.8 lb

## 2023-10-20 DIAGNOSIS — E782 Mixed hyperlipidemia: Secondary | ICD-10-CM

## 2023-10-20 DIAGNOSIS — E1165 Type 2 diabetes mellitus with hyperglycemia: Secondary | ICD-10-CM

## 2023-10-20 DIAGNOSIS — E278 Other specified disorders of adrenal gland: Secondary | ICD-10-CM

## 2023-10-20 DIAGNOSIS — Z794 Long term (current) use of insulin: Secondary | ICD-10-CM

## 2023-10-20 LAB — POCT GLYCOSYLATED HEMOGLOBIN (HGB A1C): Hemoglobin A1C: 6.1 % — AB (ref 4.0–5.6)

## 2023-10-20 MED ORDER — TRULICITY 1.5 MG/0.5ML ~~LOC~~ SOAJ: 1.5000 mg | SUBCUTANEOUS | 3 refills | Status: DC

## 2023-10-20 MED ORDER — TRULICITY 1.5 MG/0.5ML ~~LOC~~ SOAJ: SUBCUTANEOUS | 3 refills | Status: DC

## 2023-10-20 MED ORDER — TRESIBA FLEXTOUCH 200 UNIT/ML ~~LOC~~ SOPN
20.0000 [IU] | PEN_INJECTOR | Freq: Every day | SUBCUTANEOUS | 3 refills | Status: DC
Start: 2023-10-20 — End: 2024-03-13

## 2023-10-20 NOTE — Progress Notes (Signed)
Patient ID: Annette [name], female   DOB: [birth year], 63 y.o.   MRN: [id number]   HPI: Annette [name] is a 63 y.o.-year-old female, initially referred by her PCP, Dr. Lodema Hong, returning for follow-up for DM2, dx in 2018, insulin-dependent since 03/2019, uncontrolled, without long-term complications.  She previously saw Dr. Fransico Him and Dr. Talmage Nap but then switched to seeing me.  Last visit 4 months ago.    Interim history: She continues to have some nausea. No blurry vision, increased urination, chest pain.  Reviewed HbA1c levels: Lab Results  Component Value Date   HGBA1C 6.5 (A) 06/20/2023   HGBA1C 6.8 (A) 02/17/2023   HGBA1C 7.4 (A) 10/19/2022   HGBA1C 7.1 (A) 06/14/2022   HGBA1C 6.9 (A) 02/08/2022   HGBA1C 6.7 (H) 01/26/2022   HGBA1C 5.9 (A) 05/19/2021   HGBA1C 6.0 (H) 01/17/2021   HGBA1C 6.4 (H) 12/03/2020   HGBA1C 6.6 (A) 09/29/2020  06/14/2022: HbA1c calculated from fructosamine normal, at 6.27%, correlating better with blood sugar checks at home.  Previously on: - Glipizide XL 20 mg before breakfast - Lantus 50 units at bedtime (dose increased 04/08/2020) She could not tolerate Metformin due to GI intolerance - abdominal pain and diarrhea. She had vaginal yeast infections and UTIs from [place].  Currently on: - Trulicity 0.75 >> 1.5 mg weekly - started 04/2020 -tolerated well - Tresiba 50 >> ... 26 units daily We stopped glipizide XL 01/2021.  Pt checks her sugars >4x a day:  Prev.: - am:  93-134, 144 >> 130s-155 >> 96-146 >> 97-120 - 2h after b'fast: n/c - before lunch: n/c >> 120 >> n/c - 2h after lunch: n/c >> 64 >> n/c - before dinner: 62, 65, 70-176, 199 >> 84-139 >> 90s-120s >> n/c - 2h after dinner:  125-150 >> n/c >> 83-145, 154 >> <130s - bedtime: n/c >> 94 >> n/c - nighttime: n/c Lowest sugar was 43 (am) ...>> 46; she has hypoglycemia awareness in the 90s.  Highest sugar was 425 ... >> 240s >> 130s  Glucometer: One Touch verio  She is again only  eating 1 meal a day per husband's report She only drinks unsweetened tea and coffee. She eats a Healthy Choice for lunch and occasionally dinner.  -She has no history of CKD, last BUN/creatinine:  Lab Results  Component Value Date   BUN 17 05/31/2023   BUN 18 05/19/2023   CREATININE 1.04 (H) 05/31/2023   CREATININE 1.23 (H) 05/19/2023   Lab Results  Component Value Date   MICRALBCREAT 18 09/14/2022   MICRALBCREAT <8 04/23/2021   MICRALBCREAT <6.0 09/16/2017   MICRALBCREAT 2.1 11/24/2011  Not on ACE inhibitor/ARB.  + HL; last set of lipids: Lab Results  Component Value Date   CHOL 132 05/19/2023   HDL 54 05/19/2023   LDLCALC 59 05/19/2023   TRIG 100 05/19/2023   CHOLHDL 2.4 05/19/2023  On Crestor 20. She was also on Zetia but she did not feel well on it >> stopped.  - last eye exam was in 01/2023: No DR reportedly.  At last visit she described occasional double vision >> glasses were too strong.  I referred her to [facility].  - no numbness but some pain and tingling in her right foot -after her back surgery.  Last foot exam 02/17/2023.  Pt has FH of DM in mother, brother, sister.  She also has a history of adrenal adenomas: - she had previous adrenalectomy by Dr. 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   Received labeled "left adrenal gland" is a 30 g adrenal gland  which is received transected by the surgeon which, when  reapproximated has an overall measurement of 8.5 x 4.5 x 2.8 cm.  The cut surface reveals a central well-circumscribed, yellow  cortical nodule measuring 4 x 3.5 x 2.8 cm. The nodule is focally  compressing the adrenal gland. Within the surrounding soft tissue  there is a 0.3 cm yellow nodule which has a similar appearance to  the cortical nodule representative sections are submitted as  follows:   -  she was  found to have 2 adrenal adenomas, which is investigated by oncology.  They had negative Hounsfield units on CT, consistent with benign nodules.  Hormonal investigation was negative: Component     Latest Ref Rng 09/07/2021 09/09/2021 11/02/2021  Epinephrine, Rand Ur     Undefined ug/L  1    Epinephrine, U, 24Hr     0 - 20 ug/24 hr  2    Norepinephrine, Rand Ur     Undefined ug/L  19    Norepinephrine,U,24H     0 - 135 ug/24 hr  29    Dopamine, Rand Ur     Undefined ug/L  104    Dopamine, Ur, 24Hr     0 - 510 ug/24 hr  156    Total Volume  1,500    Total Volume  1,500    Normetanephrine, Ur     Undefined ug/L  142    Normetanephr.,U,24h     131 - 612 ug/24 hr  213    Metanephrine, Ur     Undefined ug/L  26    Metanephrines, 24H Ur     36 - 209 ug/24 hr  39    Norepinephrine     0 - 874 pg/mL 745     Epinephrine     0 - 62 pg/mL 19     Dopamine     0 - 48 pg/mL 40     ALDOSTERONE      ng/dL   27   Renin Activity     0.25 - 5.82 ng/mL/h   13.41 (H)   ALDO / PRA Ratio     0.9 - 28.9 Ratio   2.0   Normetanephrine, Pl     0.0 - 285.2 pg/mL 106.9     Metanephrine, Pl     0.0 - 88.0 pg/mL 10.9     C206 ACTH     6 - 50 pg/mL 4.2 (L)   23   Cortisol, Plasma     ug/dL 6.6   44.0   Cortisol, Plasma        37.2   Cortisol, Plasma        24.4   Potassium     3.5 - 5.1 mEq/L   3.8    Dexamethasone suppression test was normal: Component     Latest Ref Rng 12/03/2021  Dexamethasone, Serum     ng/dL 102   Cortisol - AM     mcg/dL 2.6 (L)     Previous investigation was also negative: Component     Latest Ref Rng 08/17/2011  Total Volume - CF 24Hr U     mL 1300   Epinephrine, 24 hr Urine     2 - 24 mcg/24 h 4   Norepinephrine,  24 hr Ur     15 - 100 mcg/24 h 39   Calculated Total (E+NE)     26 - 121 mcg/24 h 43   Dopamine, 24 hr Urine     52 - 480 mcg/24 h 139   Creatinine, Urine mg/day-CATEUR     0.63 - 2.50 g/24 h 1.34   Normetanephrine, Ur     Undefined ug/L    Normetanephr.,U,24h     131 - 612 ug/24 hr 318   Metanephrine, Ur     Undefined ug/L 422   Metanephrines, 24H Ur     36 - 209 ug/24 hr   Metanephrines, Ur     90 - 315 mcg/24 h 104    Component     Latest Ref Rng 08/17/2011  Cortisol, Plasma     ug/dL 16.1   Aldosterone, Serum      ng/dL 23    09/60/4540: Component Ref Range & Units 10 yr ago  Total Volume - CF 24Hr U mL 1300   Epinephrine, 24 hr Urine 2 - 24 mcg/24 h 4   Norepinephrine, 24 hr Ur 15 - 100 mcg/24 h 39   Calculated Total (E+NE) 26 - 121 mcg/24 h 43   Dopamine, 24 hr Urine 52 - 480 mcg/24 h 139   Creatinine, Urine mg/day-CATEUR 0.63 - 2.50 g/24 h 1.34   Resulting Agency  [facility]   Component Ref Range & Units 10 yr ago  Metanephrines, Ur 90 - 315 mcg/24 h 104   Comment: This specimen was submitted with a pH greater than 6.0. Optimum pH for this assay is 1.0-6.0. Improper preservation may compromise the validity of the assay.  Normetanephrine, 24H Ur 122 - 676 mcg/24 h 318   Metaneph Total, Ur 224 - 832 mcg/24 h 422   Comment: A four fold elevation of urinary normetanephrines is extremely likely to be due to a tumor, while a four fold elevation of urinary metanephrines is highly suggestive, but not diagnostic of the tumor. Measurement of plasma Metanephrines and Chromogranin A is recommended for confirmation.  Resulting Agency  [facility]   12/22/2009: Cortisol,F,ug/L,U Undefined ug/L 20   Cortisol, 24H Ur 0-50 ug/24 hr 50    She also has a history of HTN, OSA. She had COVID-19 10/2019. She recovered well afterwards.  She also has a history of partial thyroidectomy. Reviewed latest TSH: Lab Results  Component Value Date   TSH 1.700 05/19/2023   She had back surgery (12/2020).  She developed significant leg pain afterwards which slowly improved.  ROS: + See HPI  I reviewed pt's medications, allergies, PMH, social hx, family hx, and changes were documented in the history of present illness.  Otherwise, unchanged from my initial visit note.  Past Medical History:  Diagnosis Date   Acute blood loss anemia 02/16/2012   Arthritis    Phreesia 10/14/2020   Blood transfusion without reported diagnosis    Phreesia 10/14/2020   Breast cancer (HCC) 2007   History of  XRT, onTamoxifen   Bruises easily    Cancer (HCC)    Phreesia 10/14/2020   Depression    Depression    Phreesia 10/14/2020   Diabetes mellitus    prediabetic   Diabetes mellitus without complication (HCC)    Phreesia 10/14/2020   GERD (gastroesophageal reflux disease)    Hypertriglyceridemia    Hypothyroidism    following chemo amnd radiaition for breast cancer, needed replacement short term   Hypothyroidism (acquired)    replaced x 1  year   IBS (irritable bowel syndrome)    Menieres disease    Controlled with triamterene    Migraines    Obesity    Palpitations    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HCC)    Sleep apnea 2010   Problems with CPAP   Past Surgical History:  Procedure Laterality Date   ABDOMINAL EXPOSURE N/A 12/03/2020   Procedure: ABDOMINAL EXPOSURE;  Surgeon: Larina Earthly, MD;  Location: [facility] OR;  Service: Vascular;  Laterality: N/A;   ABDOMINAL HYSTERECTOMY  1998   Benign, fibroids   ADRENALECTOMY  02/16/2012   [facility], splemic trauma, 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   ANTERIOR LUMBAR FUSION N/A 12/03/2020   Procedure: Lumbar Four-Five Lumbar Five Sacral One Anterior lumbar interbody fusion;  Surgeon: Coletta Memos, MD;  Location: [facility] OR;  Service: Neurosurgery;  Laterality: N/A;   APPENDECTOMY  06/12/08   BIOPSY  07/02/2020   Procedure: BIOPSY;  Surgeon: Dolores Frame, MD;  Location: [facility];  Service: Gastroenterology;;   BIOPSY  01/22/2021   Procedure: BIOPSY;  Surgeon: Corbin Ade, MD;  [facility];  Service: Endoscopy;;   BIOPSY  02/17/2022   Procedure: BIOPSY;  Surgeon: Malissa Hippo, MD;  [facility];  Service: Endoscopy;;   BREAST LUMPECTOMY Left 2008    BREAST RECONSTRUCTION     Left reconstructive   BREAST SURGERY  2007   Left lumpectomy   BREAST SURGERY     Mammosite - right side   CHOLECYSTECTOMY  1985   COLONOSCOPY WITH PROPOFOL N/A 07/02/2020   Procedure: COLONOSCOPY WITH PROPOFOL;  Surgeon: Dolores Frame, MD;  [facility];  Service: Gastroenterology;  Laterality: N/A;  1045   COLONOSCOPY WITH PROPOFOL N/A 04/01/2021   Procedure: COLONOSCOPY WITH PROPOFOL;  Surgeon: Dolores Frame, MD;  [facility];  Service: Gastroenterology;  Laterality: N/A;  12:50   ESOPHAGEAL DILATION  12/19/2015   Procedure: ESOPHAGEAL DILATION;  Surgeon: Malissa Hippo, MD;  [facility];  Service: Endoscopy;;   ESOPHAGOGASTRODUODENOSCOPY N/A 12/19/2015   Procedure: ESOPHAGOGASTRODUODENOSCOPY (EGD);  Surgeon: Malissa Hippo, MD;  [facility];  Service: Endoscopy;  Laterality: N/A;  11:40   ESOPHAGOGASTRODUODENOSCOPY (EGD) WITH PROPOFOL N/A 02/17/2022   Procedure: ESOPHAGOGASTRODUODENOSCOPY (EGD) WITH PROPOFOL;  Surgeon: Malissa Hippo, MD;  [facility];  Service: Endoscopy;  Laterality: N/A;  1045   FLEXIBLE SIGMOIDOSCOPY N/A 01/22/2021   Procedure: FLEXIBLE SIGMOIDOSCOPY;  Surgeon: Corbin Ade, MD;  [facility];  Service: Endoscopy;  Laterality: N/A;   HEMOSTASIS CLIP PLACEMENT  04/01/2021   Procedure: HEMOSTASIS CLIP PLACEMENT;  Surgeon: Dolores Frame, MD;  [facility];  Service: Gastroenterology;;  x2   POLYPECTOMY  07/02/2020   Procedure: POLYPECTOMY;  Surgeon: Dolores Frame, MD;  [facility];  Service: Gastroenterology;;   POLYPECTOMY  04/01/2021   Procedure: POLYPECTOMY INTESTINAL;  Surgeon: Dolores Frame, MD;  [facility];  Service: Gastroenterology;;   SPLENECTOMY, 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   THYROIDECTOMY, PARTIAL  11/05/2010   Benign disease    Social History   Socioeconomic History   Marital status: Married    Spouse name: Clovis Riley   Number of children: 2   Years of education: Not on file   Highest education level: Not on file  Occupational History   Not on file  Tobacco Use   Smoking status: Every Day  Current packs/day: 0.50    Average packs/day: 0.5 packs/day for 40.0 years (20.0 ttl pk-yrs)    Types: Cigarettes   Smokeless tobacco: Never   Tobacco comments:    1/2 pack a day    02/15/2023 Patient smokes about 1/2 pack daily  Vaping Use   Vaping status: Never Used  Substance and Sexual Activity   Alcohol use: No    Alcohol/week: 0.0 standard drinks of alcohol    Comment: Encouraged to quit smoking. She has tried the nicotone gum and patches but did not help   Drug use: No   Sexual activity: Yes    Birth control/protection: Surgical  Other Topics Concern   Not on file  Social History Narrative   Not on file   Social Drivers of Health   Financial Resource Strain: Medium Risk (11/11/2020)   Overall Financial Resource Strain (CARDIA)    Difficulty of Paying Living Expenses: Somewhat hard  Food Insecurity: No Food Insecurity (11/11/2020)   Hunger Vital Sign    Worried About Running Out of Food in the Last Year: Never true    Ran Out of Food in the Last Year: Never true  Transportation Needs: No Transportation Needs (11/11/2020)   PRAPARE - Administrator, Civil Service (Medical): No    Lack of Transportation (Non-Medical): No  Physical Activity: Inactive (11/11/2020)   Exercise Vital Sign    Days of Exercise per Week: 0 days    Minutes of Exercise per Session: 0 min  Stress: Stress Concern Present (11/11/2020)   [employer] of Occupational Health - Occupational Stress Questionnaire    Feeling of Stress : To some extent  Social Connections: Moderately Integrated (11/11/2020)   Social Connection and Isolation Panel [NHANES]    Frequency of Communication with Friends and Family: Three times a  week    Frequency of Social Gatherings with Friends and Family: Three times a week    Attends Religious Services: More than 4 times per year    Active Member of Clubs or Organizations: No    Attends Banker Meetings: Never    Marital Status: Married  Catering manager Violence: Not At Risk (11/11/2020)   Humiliation, Afraid, Rape, and Kick questionnaire    Fear of Current or Ex-Partner: No    Emotionally Abused: No    Physically Abused: No    Sexually Abused: No   Current Outpatient Medications on File Prior to Visit  Medication Sig Dispense Refill   albuterol (VENTOLIN HFA) 108 (90 Base) MCG/ACT inhaler TAKE 2 PUFFS BY MOUTH EVERY 6 HOURS AS NEEDED FOR WHEEZE OR SHORTNESS OF BREATH     butalbital-acetaminophen-caffeine (FIORICET) 50-325-40 MG tablet Take one tablet by mouth every 8  to 12 hours , as needed, for uncontrolled headache 20 tablet 0   Continuous Glucose Sensor (FREESTYLE LIBRE 3 SENSOR) MISC 1 each by Does not apply route every 14 (fourteen) days. 6 each 3   Dulaglutide (TRULICITY) 1.5 MG/0.5ML SOPN INJECT 1.5MG  SUBCUTANEOUSLY ONCE A WEEK 6 mL 1   fluticasone (FLONASE) 50 MCG/ACT nasal spray SPRAY 2 SPRAYS INTO EACH NOSTRIL EVERY DAY 48 mL 2   insulin degludec (TRESIBA FLEXTOUCH) 200 UNIT/ML FlexTouch Pen Inject 26 Units into the skin at bedtime. 18 mL 1   Insulin Pen Needle (BD PEN NEEDLE NANO 2ND GEN) 32G X 4 MM MISC USE 4X DAILY AS DIRECTED 400 each 1   ketorolac (TORADOL) 10 MG tablet Take 1 tablet by mouth every 6 (six) hours as  needed.     Lancets (ONETOUCH DELICA PLUS LANCET33G) MISC USE TO TEST 4 TIMES A DAY 100 each 7   LORazepam (ATIVAN) 1 MG tablet Take one tablet by mouth once daily, as needed, for uncontrolled nausea 30 tablet 1   methocarbamol (ROBAXIN-750) 750 MG tablet Take 1 tablet (750 mg total) by mouth every 8 (eight) hours as needed for muscle spasms. 30 tablet 0   omeprazole (PRILOSEC) 40 MG capsule Take 1 capsule (40 mg total) by mouth daily. 30  capsule 5   ondansetron (ZOFRAN-ODT) 4 MG disintegrating tablet Take 4 mg by mouth every 8 (eight) hours as needed.     ONETOUCH VERIO test strip USE AS DIRECTED 2 TIMES DAILY 200 strip 3   rosuvastatin (CRESTOR) 20 MG tablet TAKE 1 TABLET BY MOUTH EVERY DAY 90 tablet 1   temazepam (RESTORIL) 15 MG capsule TAKE 1 CAPSULE AT BEDTIME  AS NEEDED FOR SLEEP 90 capsule 0   traZODone (DESYREL) 100 MG tablet TAKE 1 TABLET BY MOUTH EVERYDAY AT BEDTIME 90 tablet 1   triamterene-hydrochlorothiazide (DYAZIDE) 37.5-25 MG capsule TAKE 1 CAPSULE EVERY       MORNING 90 capsule 1   UNABLE TO FIND CPAP tubing and supplies, face mask Dx: G47.30 1 each 0   venlafaxine XR (EFFEXOR-XR) 75 MG 24 hr capsule TAKE 1 CAPSULE DAILY 90 capsule 1   No current facility-administered medications on file prior to visit.   Allergies  Allergen Reactions   Penicillins Swelling    Has patient had a PCN reaction causing immediate rash, facial/tongue/throat swelling, SOB or lightheadedness with hypotension: Yes Has patient had a PCN reaction causing severe rash involving mucus membranes or skin necrosis: No Has patient had a PCN reaction that required hospitalization: No  Has patient had a PCN reaction occurring within the last 10 years: No If all of the above answers are "NO", then may proceed with Cephalosporin use.   Of face and tongue.   Metformin And Related Diarrhea and Nausea Only    Pt to discontinue med due to intolerance, cramps and diarrhea   Wound Dressing Adhesive Itching and Rash    Dermabond and other surgical glues for incisions   Jardiance [Empagliflozin] Other (See Comments)    Yeast infection     Statins Other (See Comments)    Fell bad but on Crestor   Zetia [Ezetimibe] Other (See Comments)    Feels funny   Family History  Problem Relation Age of Onset   Diabetes Mother    Arrhythmia Mother    Heart disease Father    Hyperlipidemia Father    Arrhythmia Father    Cancer Paternal Grandmother         Breast   Diabetes Sister    Diabetes Brother    Heart disease Brother    PE: BP 112/64   Pulse 86   Ht 5\' 6"  (1.676 m)   Wt 184 lb 12.8 oz (83.8 kg)   SpO2 94%   BMI 29.83 kg/m  Wt Readings from Last 3 Encounters:  10/20/23 184 lb 12.8 oz (83.8 kg)  09/20/23 186 lb (84.4 kg)  06/20/23 185 lb 3.2 oz (84 kg)   Constitutional: overweight, in NAD Eyes: EOMI, no exophthalmos ENT: no thyromegaly, no cervical lymphadenopathy Cardiovascular: RRR, No MRG Respiratory: CTA B Musculoskeletal: no deformities Skin:  no rashes Neurological: no tremor with outstretched hands  ASSESSMENT: 1. DM2, insulin-dependent, uncontrolled, without long-term complications, but with hyperglycemia  No family history of medullary thyroid  cancer or personal history of pancreatitis.  2. HL  3.  Adrenal adenoma  PLAN:  1. Patient with longstanding, previously uncontrolled, type 2 diabetes, on injectable antidiabetic regimen with long-acting insulin and weekly GLP-1 receptor agonist, with improving control.  HbA1c decreased gradually to 6.5% at last visit.  At last visit, sugars were at goal reportedly so we did not change her regimen.  We again discussed about the advantages of having a CGM as she had a prescription and did not pick it up from the pharmacy.  I encouraged her to do so. CGM interpretation: -At today's visit, we reviewed her CGM downloads: It appears that 93% of values are in target range (goal >70%), while 6% are higher than 180 (goal <25%), and 1% are lower than 70 (goal <4%).  The calculated average blood sugar is 116.  The projected HbA1c for the next 3 months (GMI) is 6.1%. -Reviewing the CGM trends, sugars appear to be well-controlled, dropping overnight and increasing slightly after breakfast but then gradually in the afternoon with higher blood sugars around midnight.  Upon questioning, she is drinking a caramel macchiato coffee in the afternoon and this increases her blood sugars.  We  discussed about potentially stopping this or switching to regular coffee.  Will also decrease the dose of her Evaristo Bury to avoid low blood sugars as she does occasionally have sugars under 70s especially after 3 AM. -Her Trulicity was very expensive ($700) when she tried to refill it in 09/2023.  I am hoping that we can continue with this in the new year.  At today's visit I will send the prescription to [facility], per her preference. - I suggested to:  Patient Instructions  Please continue: - Trulicity 1.5 mg weekly  Please reduce: - Tresiba 20 units daily  Try to stop the caramel macchiato.  Please return in 4 months with your sugar log.   - we checked her HbA1c: 6.1% (lower) - advised to check sugars at different times of the day - 4x a day, rotating check times - advised for yearly eye exams >> she is UTD - return to clinic in 4 months  2. HL -Reviewed her latest lipid panel from 05/2023: All fractions at goal: Lab Results  Component Value Date   CHOL 132 05/19/2023   HDL 54 05/19/2023   LDLCALC 59 05/19/2023   TRIG 100 05/19/2023   CHOLHDL 2.4 05/19/2023  -She continues Crestor 20 mg daily without side effects  3.  Adrenal adenoma -Nonfunctional.  Labs were repeatedly normal. -Reviewed previous adrenal pathology from her surgery with Dr. Lady Gary: Benign adenomas.  This is likely a bilateral process for her. -Adrenal hormone testing from 2011 on was reviewed and this was normal -Latest investigation was in 2023 when we checked aldosterone, cortisol, plasma metanephrines and catecholamines and they were all normal.  A dexamethasone suppression test was also normal.  -Will continue to follow her without intervention for now  Carlus Pavlov, MD PhD [facility] Endocr[facility]ology

## 2023-10-20 NOTE — Patient Instructions (Addendum)
Please continue: - Trulicity 1.5 mg weekly  Please reduce: - Tresiba 20 units daily  Try to stop the caramel macchiato.  Please return in 4 months with your sugar log.

## 2023-10-21 LAB — MICROALBUMIN / CREATININE URINE RATIO
Creatinine, Urine: 95 mg/dL (ref 20–275)
Microalb Creat Ratio: 14 mg/g{creat} (ref <30)
Microalb, Ur: 1.3 mg/dL

## 2023-10-24 ENCOUNTER — Encounter (HOSPITAL_BASED_OUTPATIENT_CLINIC_OR_DEPARTMENT_OTHER): Payer: Self-pay

## 2023-10-25 NOTE — Telephone Encounter (Signed)
Requested Prescriptions   Signed Prescriptions Disp Refills   Dulaglutide (TRULICITY) 1.5 MG/0.5ML SOAJ 6 mL 3    Sig: Inject 1.5 mg into the skin once a week.    Authorizing Provider: Carlus Pavlov    Ordering User: Pollie Meyer   Rx was faxed to the pharmacy

## 2023-10-30 ENCOUNTER — Other Ambulatory Visit: Payer: Self-pay | Admitting: Family Medicine

## 2023-10-30 DIAGNOSIS — F5104 Psychophysiologic insomnia: Secondary | ICD-10-CM

## 2023-11-02 ENCOUNTER — Other Ambulatory Visit: Payer: Self-pay | Admitting: Family Medicine

## 2023-11-10 ENCOUNTER — Ambulatory Visit (HOSPITAL_BASED_OUTPATIENT_CLINIC_OR_DEPARTMENT_OTHER): Payer: Medicare HMO | Admitting: Pulmonary Disease

## 2023-11-16 ENCOUNTER — Ambulatory Visit (HOSPITAL_BASED_OUTPATIENT_CLINIC_OR_DEPARTMENT_OTHER): Payer: Medicare HMO | Admitting: Pulmonary Disease

## 2023-11-17 ENCOUNTER — Other Ambulatory Visit: Payer: Self-pay | Admitting: Family Medicine

## 2023-11-24 ENCOUNTER — Other Ambulatory Visit: Payer: Self-pay | Admitting: Family Medicine

## 2023-11-24 MED ORDER — LORAZEPAM 1 MG PO TABS: ORAL_TABLET | ORAL | 0 refills | Status: DC

## 2023-11-24 MED ORDER — TEMAZEPAM 15 MG PO CAPS: 15.0000 mg | ORAL_CAPSULE | Freq: Every evening | ORAL | 0 refills | Status: DC | PRN

## 2023-11-27 ENCOUNTER — Other Ambulatory Visit: Payer: Self-pay | Admitting: Family Medicine

## 2023-11-27 DIAGNOSIS — E785 Hyperlipidemia, unspecified: Secondary | ICD-10-CM

## 2023-12-09 ENCOUNTER — Other Ambulatory Visit: Payer: Self-pay | Admitting: Family Medicine

## 2023-12-09 NOTE — Telephone Encounter (Unsigned)
 Copied from CRM [id number]. Topic: Clinical - Medication Refill >> Dec 09, 2023  2:57 PM Higinio Roger wrote: Most Recent Primary Care Visit:  Provider: Kerri Perches  Department: [facility]  Visit Type: OFFICE VISIT  Date: 05/19/2023  Medication: LORazepam (ATIVAN) 1 MG tablet  Has the patient contacted their pharmacy? Yes (Agent: If no, request that the patient contact the pharmacy for the refill. If patient does not wish to contact the pharmacy document the reason why and proceed with request.) (Agent: If yes, when and what did the pharmacy advise?) They have tried several times to refill prescription  Is this the correct pharmacy for this prescription? Yes If no, delete pharmacy and type the correct one.  This is the patient's preferred pharmacy:    [id number] [place], [place] - [address] AT [facility] [place] [address] [place] [address] Phone: [phone] Fax: [phone]   Has the prescription been filled recently? Yes  Is the patient out of the medication? Yes  Has the patient been seen for an appointment in the last year OR does the patient have an upcoming appointment? Yes  Can we respond through MyChart? Yes  Agent: Please be advised that Rx refills may take up to 3 business days. We ask that you follow-up with your pharmacy.

## 2023-12-09 NOTE — Telephone Encounter (Unsigned)
 Copied from CRM [id number]. Topic: Clinical - Medication Refill >> Dec 09, 2023  2:54 PM Higinio Roger wrote: Most Recent Primary Care Visit:  Provider: Kerri Perches  Department: [facility]  Visit Type: OFFICE VISIT  Date: 05/19/2023  Medication: LORazepam (ATIVAN) 1 MG tablet   Has the patient contacted their pharmacy? Yes (Agent: If no, request that the patient contact the pharmacy for the refill. If patient does not wish to contact the pharmacy document the reason why and proceed with request.) (Agent: If yes, when and what did the pharmacy advise?) They have tried several time to refill prescription  Is this the correct pharmacy for this prescription? Yes If no, delete pharmacy and type the correct one.  This is the patient's preferred pharmacy:   [facility] - [place], [place] - [address] AT Portal to Registered [employer] [facility] [address] [place] [address] Phone: [phone] Fax: [phone]   Has the prescription been filled recently? Yes  Is the patient out of the medication? Yes  Has the patient been seen for an appointment in the last year OR does the patient have an upcoming appointment? Yes  Can we respond through MyChart? Yes  Agent: Please be advised that Rx refills may take up to 3 business days. We ask that you follow-up with your pharmacy.

## 2023-12-12 ENCOUNTER — Other Ambulatory Visit: Payer: Self-pay | Admitting: Family Medicine

## 2023-12-20 ENCOUNTER — Telehealth: Payer: Self-pay | Admitting: Family Medicine

## 2023-12-20 MED ORDER — LORAZEPAM 1 MG PO TABS: ORAL_TABLET | ORAL | 0 refills | Status: DC

## 2023-12-20 NOTE — Telephone Encounter (Signed)
 Refill request for her ativan was sent to you on 3/7. States it was never approved. Pls refill ativan if appropriate

## 2023-12-21 NOTE — Telephone Encounter (Signed)
 Aware med has been sent

## 2024-01-03 ENCOUNTER — Ambulatory Visit: Payer: Medicare HMO | Admitting: Family Medicine

## 2024-01-03 VITALS — BP 96/61 | HR 107 | Resp 16 | Ht 66.0 in | Wt 184.0 lb

## 2024-01-03 DIAGNOSIS — Z860100 Personal history of colon polyps, unspecified: Secondary | ICD-10-CM

## 2024-01-03 DIAGNOSIS — E1169 Type 2 diabetes mellitus with other specified complication: Secondary | ICD-10-CM

## 2024-01-03 DIAGNOSIS — Z794 Long term (current) use of insulin: Secondary | ICD-10-CM

## 2024-01-03 DIAGNOSIS — R112 Nausea with vomiting, unspecified: Secondary | ICD-10-CM | POA: Diagnosis not present

## 2024-01-03 DIAGNOSIS — Z1231 Encounter for screening mammogram for malignant neoplasm of breast: Secondary | ICD-10-CM | POA: Diagnosis not present

## 2024-01-03 DIAGNOSIS — F17218 Nicotine dependence, cigarettes, with other nicotine-induced disorders: Secondary | ICD-10-CM

## 2024-01-03 DIAGNOSIS — E785 Hyperlipidemia, unspecified: Secondary | ICD-10-CM

## 2024-01-03 NOTE — Patient Instructions (Addendum)
 Annual exam in office in 4 months, call if you need me sooner  Wellness visit with nurse past due please schedule at checkout  Hepatic panel and chol today  Pls sceule mammogram at checkout  Urgent referral to gI re daily nusea and vomiting and past due colonoscopy  I recommend stopping trulicity, please reach out to your Endo Doc for guidance re this  5 day antibiotic  course prescribed in the even t of a UTI only, no refills will be sent you will need clinical eval  Congrats on 7 per day keep working at quitting  Thanks for choosing [facility], we consider it a privelige to serve you.

## 2024-01-03 NOTE — Assessment & Plan Note (Signed)
 Hyperlipidemia:Low fat diet discussed and encouraged.   Lipid Panel  Lab Results  Component Value Date   CHOL 132 05/19/2023   HDL 54 05/19/2023   LDLCALC 59 05/19/2023   TRIG 100 05/19/2023   CHOLHDL 2.4 05/19/2023     Updated lab needed at/ before next visit.

## 2024-01-04 ENCOUNTER — Encounter (INDEPENDENT_AMBULATORY_CARE_PROVIDER_SITE_OTHER): Payer: Self-pay | Admitting: *Deleted

## 2024-01-04 ENCOUNTER — Encounter: Payer: Self-pay | Admitting: Family Medicine

## 2024-01-04 LAB — HEPATIC FUNCTION PANEL
ALT: 16 IU/L (ref 0–32)
AST: 23 IU/L (ref 0–40)
Albumin: 4.5 g/dL (ref 3.9–4.9)
Alkaline Phosphatase: 82 IU/L (ref 44–121)
Bilirubin Total: 0.5 mg/dL (ref 0.0–1.2)
Bilirubin, Direct: 0.15 mg/dL (ref 0.00–0.40)
Total Protein: 7.1 g/dL (ref 6.0–8.5)

## 2024-01-04 LAB — LIPID PANEL W/O CHOL/HDL RATIO
Cholesterol, Total: 150 mg/dL (ref 100–199)
HDL: 51 mg/dL (ref >39)
LDL Chol Calc (NIH): 81 mg/dL (ref 0–99)
Triglycerides: 98 mg/dL (ref 0–149)
VLDL Cholesterol Cal: 18 mg/dL (ref 5–40)

## 2024-01-10 ENCOUNTER — Ambulatory Visit (INDEPENDENT_AMBULATORY_CARE_PROVIDER_SITE_OTHER): Admitting: Family Medicine

## 2024-01-10 ENCOUNTER — Encounter: Payer: Self-pay | Admitting: Family Medicine

## 2024-01-10 VITALS — BP 93/62 | HR 87 | Resp 16 | Ht 66.0 in | Wt 187.0 lb

## 2024-01-10 DIAGNOSIS — Z0000 Encounter for general adult medical examination without abnormal findings: Secondary | ICD-10-CM | POA: Insufficient documentation

## 2024-01-10 DIAGNOSIS — R079 Chest pain, unspecified: Secondary | ICD-10-CM | POA: Diagnosis not present

## 2024-01-10 MED ORDER — BUTALBITAL-APAP-CAFFEINE 50-325-40 MG PO TABS: ORAL_TABLET | ORAL | 3 refills | Status: AC

## 2024-01-10 MED ORDER — NICOTINE 7 MG/24HR TD PT24: 7.0000 mg | MEDICATED_PATCH | Freq: Every day | TRANSDERMAL | 0 refills | Status: DC

## 2024-01-10 NOTE — Progress Notes (Unsigned)
 Subjective:    Annette [name] is a 63 y.o. female who presents for a Welcome to Medicare exam.   Cardiac Risk Factors include: diabetes mellitus;dyslipidemia;smoking/ tobacco exposure      Objective:    Today's Vitals   01/10/24 1527 01/10/24 1528  BP: 93/62   Pulse: 87   Resp: 16   SpO2: 96%   Weight: 187 lb (84.8 kg)   Height: 5\' 6"  (1.676 m)   PainSc: 0-No pain 0-No pain  Body mass index is 30.18 kg/m.  Medications Outpatient Encounter Medications as of 01/10/2024  Medication Sig   albuterol (VENTOLIN HFA) 108 (90 Base) MCG/ACT inhaler TAKE 2 PUFFS BY MOUTH EVERY 6 HOURS AS NEEDED FOR WHEEZE OR SHORTNESS OF BREATH   butalbital-acetaminophen-caffeine (FIORICET) 50-325-40 MG tablet Take one tablet by mouth every 8  to 12 hours , as needed, for uncontrolled headache   Continuous Glucose Sensor (FREESTYLE LIBRE 3 SENSOR) MISC 1 each by Does not apply route every 14 (fourteen) days.   fluticasone (FLONASE) 50 MCG/ACT nasal spray SPRAY 2 SPRAYS INTO EACH NOSTRIL EVERY DAY   insulin degludec (TRESIBA FLEXTOUCH) 200 UNIT/ML FlexTouch Pen Inject 20 Units into the skin at bedtime.   Insulin Pen Needle (BD PEN NEEDLE NANO 2ND GEN) 32G X 4 MM MISC USE 4X DAILY AS DIRECTED   Lancets (ONETOUCH DELICA PLUS LANCET33G) MISC USE TO TEST 4 TIMES A DAY   LORazepam (ATIVAN) 1 MG tablet Take one tablet by mouth once daily, as needed, for uncontrolled nausea   nitrofurantoin, macrocrystal-monohydrate, (MACROBID) 100 MG capsule Take 1 capsule (100 mg total) by mouth 2 (two) times daily.   omeprazole (PRILOSEC) 40 MG capsule TAKE 1 CAPSULE DAILY   ONETOUCH VERIO test strip USE AS DIRECTED 2 TIMES DAILY   rosuvastatin (CRESTOR) 20 MG tablet TAKE 1 TABLET BY MOUTH EVERY DAY   temazepam (RESTORIL) 15 MG capsule TAKE 1 CAPSULE AT BEDTIME  AS NEEDED FOR SLEEP   temazepam (RESTORIL) 15 MG capsule Take 1 capsule (15 mg total) by mouth at bedtime as needed for sleep.   traZODone (DESYREL) 100 MG tablet  TAKE 1 TABLET BY MOUTH EVERYDAY AT BEDTIME   triamterene-hydrochlorothiazide (DYAZIDE) 37.5-25 MG capsule TAKE 1 CAPSULE EVERY       MORNING   UNABLE TO FIND CPAP tubing and supplies, face mask Dx: G47.30   venlafaxine XR (EFFEXOR-XR) 75 MG 24 hr capsule TAKE 1 CAPSULE BY MOUTH EVERY DAY   [DISCONTINUED] Dulaglutide (TRULICITY) 1.5 MG/0.5ML SOAJ Inject 1.5 mg into the skin once a week. (Patient not taking: Reported on 01/10/2024)   No facility-administered encounter medications on file as of 01/10/2024.     History: Past Medical History:  Diagnosis Date   Acute blood loss anemia 02/16/2012   Allergy    Arthritis    Phreesia 10/14/2020   Blood transfusion without reported diagnosis    Phreesia 10/14/2020   Breast cancer (HCC) 2007   History of  XRT, onTamoxifen   Bruises easily    Cancer (HCC)    Phreesia 10/14/2020   Depression    Depression    Phreesia 10/14/2020   Diabetes mellitus    prediabetic   Diabetes mellitus without complication (HCC)    Phreesia 10/14/2020   GERD (gastroesophageal reflux disease)    Hypertriglyceridemia    Hypothyroidism    following chemo amnd radiaition for breast cancer, needed replacement short term   Hypothyroidism (acquired)    replaced x 1 year   IBS (irritable bowel  syndrome)    Menieres disease    Controlled with triamterene    Migraines    Obesity    Palpitations    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HCC)    Sleep apnea 2010   Problems with CPAP   Past Surgical History:  Procedure Laterality Date   ABDOMINAL EXPOSURE N/A 12/03/2020   Procedure: ABDOMINAL EXPOSURE;  Surgeon: Larina Earthly, MD;  Location: [facility] OR;  Service: Vascular;  Laterality: N/A;   ABDOMINAL HYSTERECTOMY  1998   Benign, fibroids   ADRENALECTOMY  02/16/2012   [facility], splemic trauma, 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   ANTERIOR LUMBAR FUSION N/A 12/03/2020   Procedure: Lumbar Four-Five Lumbar Five Sacral One Anterior lumbar interbody fusion;  Surgeon: Coletta Memos, MD;  Location:  [facility] OR;  Service: Neurosurgery;  Laterality: N/A;   APPENDECTOMY  06/12/2008   BIOPSY  07/02/2020   Procedure: BIOPSY;  Surgeon: Dolores Frame, MD;  Location: [facility];  Service: Gastroenterology;;   BIOPSY  01/22/2021   Procedure: BIOPSY;  Surgeon: Corbin Ade, MD;  Location: [facility];  Service: Endoscopy;;   BIOPSY  02/17/2022   Procedure: BIOPSY;  Surgeon: Malissa Hippo, MD;  [facility]: [facility];  Service: Endoscopy;;   BREAST LUMPECTOMY Left 2008   BREAST RECONSTRUCTION     Left reconstructive   BREAST SURGERY  2007   Left lumpectomy   BREAST SURGERY     Mammosite - right side   CHOLECYSTECTOMY  1985   COLONOSCOPY WITH PROPOFOL N/A 07/02/2020   Procedure: COLONOSCOPY WITH PROPOFOL;  Surgeon: Dolores Frame, MD;  Location: [facility];  Service: Gastroenterology;  Laterality: N/A;  1045   COLONOSCOPY WITH PROPOFOL N/A 04/01/2021   Procedure: COLONOSCOPY WITH PROPOFOL;  Surgeon: Dolores Frame, MD;  [facility]: [facility];  Service: Gastroenterology;  Laterality: N/A;  12:50   ESOPHAGEAL DILATION  12/19/2015   Procedure: ESOPHAGEAL DILATION;  Surgeon: Malissa Hippo, MD;  [facility];  Service: Endoscopy;;   ESOPHAGOGASTRODUODENOSCOPY N/A 12/19/2015   Procedure: ESOPHAGOGASTRODUODENOSCOPY (EGD);  Surgeon: Malissa Hippo, MD;  [facility];  Service: Endoscopy;  Laterality: N/A;  11:40   ESOPHAGOGASTRODUODENOSCOPY (EGD) WITH PROPOFOL N/A 02/17/2022   Procedure: ESOPHAGOGASTRODUODENOSCOPY (EGD) WITH PROPOFOL;  Surgeon: Malissa Hippo, MD;  [facility];  Service: Endoscopy;  Laterality: N/A;  1045   FLEXIBLE SIGMOIDOSCOPY N/A 01/22/2021   Procedure: FLEXIBLE SIGMOIDOSCOPY;  Surgeon: Corbin Ade, MD;  [facility];  Service: Endoscopy;  Laterality: N/A;   HEMOSTASIS CLIP PLACEMENT  04/01/2021   Procedure: HEMOSTASIS CLIP PLACEMENT;  Surgeon: Dolores Frame, MD;   [facility];  Service: Gastroenterology;;  x2   POLYPECTOMY  07/02/2020   Procedure: POLYPECTOMY;  Surgeon: Dolores Frame, MD;  [facility];  Service: Gastroenterology;;   POLYPECTOMY  04/01/2021   Procedure: POLYPECTOMY INTESTINAL;  Surgeon: Dolores Frame, MD;  [facility];  Service: Gastroenterology;;   SPINE SURGERY     SPLENECTOMY, 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   THYROIDECTOMY, PARTIAL  11/05/2010   Benign disease   TUBAL LIGATION      Family History  Problem Relation Age of Onset   Diabetes Mother    Arrhythmia Mother    Varicose Veins Mother    Heart disease Father    Hyperlipidemia Father    Arrhythmia Father    Depression Father    Cancer Paternal Grandmother  Breast   Diabetes Sister    Diabetes Brother    Heart disease Brother    Social History   Occupational History   Not on file  Tobacco Use   Smoking status: Every Day    Current packs/day: 0.50    Average packs/day: 0.5 packs/day for 40.0 years (20.0 ttl pk-yrs)    Types: Cigarettes   Smokeless tobacco: Never   Tobacco comments:    1/2 pack a day    02/15/2023 Patient smokes about 1/2 pack daily  Vaping Use   Vaping status: Never Used  Substance and Sexual Activity   Alcohol use: No    Comment: Encouraged to quit smoking. She has tried the nicotone gum and patches but did not help   Drug use: No   Sexual activity: Not Currently    Birth control/protection: Surgical    Tobacco Counseling Ready to quit: Not Answered Counseling given: Not Answered Tobacco comments: 1/2 pack a day 02/15/2023 Patient smokes about 1/2 pack daily   Immunizations and Health Maintenance Immunization History  Administered Date(s) Administered   HIB (PRP-OMP) 02/25/2012   HIB (PRP-T) 02/25/2012   Influenza Split 08/14/2012, 07/23/2016   Influenza,inj,Quad PF,6+ Mos 07/23/2013, 07/16/2014, 07/16/2015, 08/23/2019,  05/29/2020, 08/25/2021, 06/15/2022   Meningococcal Conjugate 02/25/2012, 08/30/2018   Meningococcal polysaccharide vaccine (MPSV4) 02/26/2012   Pneumococcal Conjugate-13 07/25/2018   Pneumococcal Polysaccharide-23 08/04/2007, 02/26/2012, 08/23/2019   Td 08/04/2007   Tdap 09/14/2022   Zoster Recombinant(Shingrix) 04/23/2021, 08/25/2021   Health Maintenance Due  Topic Date Due   Colonoscopy  04/02/2023   COVID-19 Vaccine (1 - 2024-25 season) Never done    Activities of Daily Living    01/10/2024    3:29 PM 01/09/2024    3:53 PM  In your present state of health, do you have any difficulty performing the following activities:  Hearing? 0 0  Vision? 1 0  Difficulty concentrating or making decisions? 1 0  Walking or climbing stairs? 0 0  Dressing or bathing? 0 0  Doing errands, shopping? 0 0  Preparing Food and eating ? N N  Using the Toilet? N N  In the past six months, have you accidently leaked urine? N N  Do you have problems with loss of bowel control? N N  Managing your Medications? N N  Managing your Finances? N N  Housekeeping or managing your Housekeeping? N N    Physical Exam   BP 93/62   Pulse 87   Resp 16   Ht 5\' 6"  (1.676 m)   Wt 187 lb (84.8 kg)   SpO2 96%   BMI 30.18 kg/m   Physical Exam (optional), or other factors deemed appropriate based on the beneficiary's medical and social history and current clinical standards.   Advanced Directives: Does Patient Have a Medical Advance Directive?: No Would patient like information on creating a medical advance directive?: Yes (ED - Information included in AVS)  EKG:  normal EKG, normal sinus rhythm, unchanged from previous tracings      Assessment:    This is a routine wellness examination for this patient . Jerel Shepherd  Vision/Hearing screen No results found.   Goals       Patient Stated (pt-stated)      "To survive another year"        Depression Screen    01/03/2024    3:18 PM 05/19/2023     3:12 PM 12/14/2022    3:34 PM 09/14/2022    2:53 PM  PHQ 2/9 Scores  PHQ - 2 Score 3 3 1 3   PHQ- 9 Score 5 7 5 7      Fall Risk    01/10/2024    3:37 PM  Fall Risk   Falls in the past year? 0  Number falls in past yr: 0  Injury with Fall? 0  Follow up Falls evaluation completed    Cognitive Function:        01/10/2024    3:37 PM  6CIT Screen  What Year? 0 points  What month? 0 points  Count back from 20 0 points  Months in reverse 0 points  Repeat phrase 0 points    Patient Care Team: Kerri Perches, MD as PCP - General O'Neal, Ronnald Ramp, MD as PCP - Cardiology (Cardiology) Doreatha Massed, MD as Consulting Physician (Hematology)     Plan:   Encounter for Medicare annual wellness exam Welcome to medicare  exam as documented. Counseling done  re healthy lifestyle involving commitment to 150 minutes exercise per week, heart healthy diet, and attaining healthy weight.The importance of adequate sleep also discussed. Regular seat belt use and home safety, is also discussed. Changes in health habits are decided on by the patient with goals and time frames  set for achieving them. Increased /dedicated exercise program and smoking cessation are 2 primary goals Immunization and cancer screening needs are specifically addressed at this visit. End of care discussion very welcomed and Advanced Directive info provided for pt to address and discuss with her spouse EKG in office No abnormality , still needs annual f/u with cardiology due to multiple risk factors and continuity of care    I have personally reviewed and noted the following in the patient's chart:   Medical and social history Use of alcohol, tobacco or illicit drugs  Current medications and supplements including opioid prescriptions. Patient is not currently taking opioid prescriptions. Functional ability and status Nutritional status Physical activity Advanced directives List of other  physicians Hospitalizations, surgeries, and ER visits in previous 12 months Vitals Screenings to include cognitive, depression, and falls Referrals and appointments  In addition, I have reviewed and discussed with patient certain preventive protocols, quality metrics, and best practice recommendations. A written personalized care plan for preventive services as well as general preventive health recommendations were provided to patient.     Rogelia Boga, CMA 01/10/2024

## 2024-01-10 NOTE — Assessment & Plan Note (Signed)
 Welcome to medicare  exam as documented. Counseling done  re healthy lifestyle involving commitment to 150 minutes exercise per week, heart healthy diet, and attaining healthy weight.The importance of adequate sleep also discussed. Regular seat belt use and home safety, is also discussed. Changes in health habits are decided on by the patient with goals and time frames  set for achieving them. Increased /dedicated exercise program and smoking cessation are 2 primary goals Immunization and cancer screening needs are specifically addressed at this visit. End of care discussion very welcomed and Advanced Directive info provided for pt to address and discuss with her spouse EKG in office No abnormality , still needs annual f/u with cardiology due to multiple risk factors and continuity of care

## 2024-01-10 NOTE — Patient Instructions (Addendum)
 Annual exam with MD in 4 months, call if you need me sooner  You are referred for annual cardiology eval ''EKG in office today  Review Advanced directives, very important, please give extra copy for spouse is pt requesty  Call [phone] for assistance with smoking cessatio, they may also be able to provide patches or gum if your insurance does not cover  QUIT DATE February 01, 2024  THANKFUL nausea is GONE with ozempic gone!  It is important that you exercise regularly at least 30 minutes 5 times a week. If you develop chest pain, have severe difficulty breathing, or feel very tired, stop exercising immediately and seek medical attention

## 2024-01-11 ENCOUNTER — Encounter: Payer: Self-pay | Admitting: Family Medicine

## 2024-01-11 DIAGNOSIS — Z860100 Personal history of colon polyps, unspecified: Secondary | ICD-10-CM | POA: Insufficient documentation

## 2024-01-11 DIAGNOSIS — R112 Nausea with vomiting, unspecified: Secondary | ICD-10-CM | POA: Insufficient documentation

## 2024-01-11 NOTE — Assessment & Plan Note (Signed)
 Colonoscopy past due , referral made

## 2024-01-11 NOTE — Assessment & Plan Note (Signed)
Asked:confirms currently smokes cigarettes °Assess: Unwilling to set a quit date, but is cutting back °Advise: needs to QUIT to reduce risk of cancer, cardio and cerebrovascular disease °Assist: counseled for 5 minutes and literature provided °Arrange: follow up in 2 to 4 months ° °

## 2024-01-11 NOTE — Progress Notes (Signed)
 Annette [name]     MRN: [id number]      DOB: December 29, 1960  Chief Complaint  Patient presents with   Nausea    Follow up for nausea/vomiting    HPI Annette [name] is here for follow up and re-evaluation of chronic medical conditions, medication management and review of any available recent lab and radiology data.  Preventive health is updated, specifically  Cancer screening and Immunization.   Questions or concerns regarding consultations or procedures which the PT has had in the interim are  addressed. The PT denies any adverse reactions to current medications since the last visit.  Disabling nausea for past 6 months, debilitating even negatoively affecting her ability to comfortably leave her home, no loose stool   ROS Denies recent fever or chills. Denies sinus pressure, nasal congestion, ear pain or sore throat. Denies chest congestion, productive cough or wheezing. Denies chest pains, palpitations and leg swelling Denies dysuria, frequency, hesitancy or incontinence. Denies joint pain, swelling and limitation in mobility. Denies headaches, seizures, numbness, or tingling. Denies depression, anxiety or insomnia. Denies skin break down or rash.   PE  BP 96/61   Pulse (!) 107   Resp 16   Ht 5\' 6"  (1.676 m)   Wt 184 lb 0.6 oz (83.5 kg)   SpO2 96%   BMI 29.70 kg/m   Patient alert and oriented and in no cardiopulmonary distress.  HEENT: No facial asymmetry, EOMI,     Neck supple .  Chest: Clear to auscultation bilaterally.  CVS: S1, S2 no murmurs, no S3.Regular rate.  ABD: Soft non tender.   Ext: No edema  MS: Adequate tho reduced  ROM spine, shoulders, hips and knees.  Skin: Intact, no ulcerations or rash noted.  Psych: Good eye contact, normal affect. Memory intact not anxious or depressed appearing.  CNS: CN 2-12 intact, power,  normal throughout.no focal deficits noted.   Assessment & Plan  Hyperlipidemia LDL goal <100 Hyperlipidemia:Low fat diet  discussed and encouraged.   Lipid Panel  Lab Results  Component Value Date   CHOL 132 05/19/2023   HDL 54 05/19/2023   LDLCALC 59 05/19/2023   TRIG 100 05/19/2023   CHOLHDL 2.4 05/19/2023     Updated lab needed at/ before next visit.   Nausea and vomiting Disabling x 6 months, recommend stop ozempic and GI referral also, close monitoring of blood sugar daily  Personal history of colon polyps, unspecified Colonoscopy past due , referral made  Type 2 diabetes mellitus with other specified complication (HCC) Diabetes associated with hyperlipidemia Managed by Endo, contreolled but seems to be intolerant of ozempic , I recommend d/c ozempic amd insulin dose to be adjusted   Annette [name] is reminded of the importance of commitment to daily physical activity for 30 minutes or more, as able and the need to limit carbohydrate intake to 30 to 60 grams per meal to help with blood sugar control.   The need to take medication as prescribed, test blood sugar as directed, and to call between visits if there is a concern that blood sugar is uncontrolled is also discussed.   Ms. State is reminded of the importance of daily foot exam, annual eye examination, and good blood sugar, blood pressure and cholesterol control.     Latest Ref Rng & Units 01/03/2024    4:14 PM 10/20/2023    3:55 PM 10/20/2023    3:45 PM 06/20/2023   10:32 AM 05/31/2023    2:08 PM  Diabetic  Labs  HbA1c 4.0 - 5.6 %   6.1  6.5    Microalbumin mg/dL  1.3      Micro/Creat Ratio <30 mg/g creat  14      Chol 100 - 199 mg/dL 161       HDL >09 mg/dL 51       Calc LDL 0 - 99 mg/dL 81       Triglycerides 0 - 149 mg/dL 98       Creatinine 6.04 - 1.00 mg/dL     5.40       06/11/1190    3:27 PM 01/03/2024    3:15 PM 10/20/2023    3:11 PM 09/20/2023    1:47 PM 06/20/2023    2:42 PM 05/31/2023    3:09 PM 05/19/2023    3:11 PM  BP/Weight  Systolic BP 93 96 112 121 102 90 95  Diastolic BP 62 61 64 66 60 61 62  Wt. (Lbs) 187  184.04 184.8 186 185.2 185.6 185.08  BMI 30.18 kg/m2 29.7 kg/m2 29.83 kg/m2 30.02 kg/m2 29.89 kg/m2 29.96 kg/m2 29.87 kg/m2      Latest Ref Rng & Units 02/17/2023    2:40 PM 12/15/2022   12:00 AM  Foot/eye exam completion dates  Eye Exam No Retinopathy  No Retinopathy      Foot Form Completion  Done      This result is from an external source.        Nicotine dependence Asked:confirms currently smokes cigarettes Assess: Unwilling to set a quit date, but is cutting back Advise: needs to QUIT to reduce risk of cancer, cardio and cerebrovascular disease Assist: counseled for 5 minutes and literature provided Arrange: follow up in 2 to 4 months

## 2024-01-11 NOTE — Assessment & Plan Note (Addendum)
 Diabetes associated with hyperlipidemia Managed by Endo, contreolled but seems to be intolerant of ozempic , I recommend d/c ozempic amd insulin dose to be adjusted   Annette [name] is reminded of the importance of commitment to daily physical activity for 30 minutes or more, as able and the need to limit carbohydrate intake to 30 to 60 grams per meal to help with blood sugar control.   The need to take medication as prescribed, test blood sugar as directed, and to call between visits if there is a concern that blood sugar is uncontrolled is also discussed.   Annette [name] is reminded of the importance of daily foot exam, annual eye examination, and good blood sugar, blood pressure and cholesterol control.     Latest Ref Rng & Units 01/03/2024    4:14 PM 10/20/2023    3:55 PM 10/20/2023    3:45 PM 06/20/2023   10:32 AM 05/31/2023    2:08 PM  Diabetic Labs  HbA1c 4.0 - 5.6 %   6.1  6.5    Microalbumin mg/dL  1.3      Micro/Creat Ratio <30 mg/g creat  14      Chol 100 - 199 mg/dL 161       HDL >09 mg/dL 51       Calc LDL 0 - 99 mg/dL 81       Triglycerides 0 - 149 mg/dL 98       Creatinine 6.04 - 1.00 mg/dL     5.40       06/11/1190    3:27 PM 01/03/2024    3:15 PM 10/20/2023    3:11 PM 09/20/2023    1:47 PM 06/20/2023    2:42 PM 05/31/2023    3:09 PM 05/19/2023    3:11 PM  BP/Weight  Systolic BP 93 96 112 121 102 90 95  Diastolic BP 62 61 64 66 60 61 62  Wt. (Lbs) 187 184.04 184.8 186 185.2 185.6 185.08  BMI 30.18 kg/m2 29.7 kg/m2 29.83 kg/m2 30.02 kg/m2 29.89 kg/m2 29.96 kg/m2 29.87 kg/m2      Latest Ref Rng & Units 02/17/2023    2:40 PM 12/15/2022   12:00 AM  Foot/eye exam completion dates  Eye Exam No Retinopathy  No Retinopathy      Foot Form Completion  Done      This result is from an external source.

## 2024-01-11 NOTE — Assessment & Plan Note (Signed)
 Disabling x 6 months, recommend stop ozempic and GI referral also, close monitoring of blood sugar daily

## 2024-01-22 NOTE — Progress Notes (Signed)
 Cardiology Office Note:  .   Date:  01/24/2024  ID:  Annette [name], DOB 14-Jun-1961, MRN [id number] PCP: Towanda Fret, MD  [facility] Providers Cardiologist:  Oneil Bigness, MD {   History of Present Illness: .    Chief Complaint  Patient presents with   Follow-up         Annette [name] is a 63 y.o. female with history of DM, HLD, tobacco abuse who presents for follow-up.    History of Present Illness   Annette [name] is a 63 year old female with diabetes and hyperlipidemia who presents for follow-up. She was referred by Dr. Rodolph Clap for follow-up.  She has not experienced any further episodes of chest pain. A coronary CTA performed in 2022 was normal, and a recent EKG also showed no abnormalities. There have been no episodes of syncope, but she describes episodes of nausea and vomiting without a clear cause.  Her diabetes appears to be controlled, although she notes fluctuations in her blood sugar levels, with a recent reading of 200 mg/dL at bedtime that drops between 3 and 6 AM. She takes triamterene  for Meniere's disease, which contributes to her low blood pressure.  She continues to smoke about seven cigarettes a day, despite efforts to quit. She does not engage in regular exercise due to back pain.  She has a history of a large hernia, described as the size of a football, for which she has been referred to a specialist in [place], but she canceled the appointment due to dissatisfaction with the referral process.          Problem List Diabetes -A1c 6.1 HTN Tobacco abuse  -40 years  HLD -T chol 150, HDL 51, LDL 81, TG 98 5. IBS    ROS: All other ROS reviewed and negative. Pertinent positives noted in the HPI.     Studies Reviewed: Aaron Aas        TTE 01/17/2021  1. Left ventricular ejection fraction, by estimation, is 60 to 65%. The  left ventricle has normal function. The left ventricle has no regional  wall motion abnormalities. Left  ventricular diastolic parameters were  normal.   2. Right ventricular systolic function is normal. The right ventricular  size is normal. Tricuspid regurgitation signal is inadequate for assessing  PA pressure.   3. The mitral valve is normal in structure. Trivial mitral valve  regurgitation. No evidence of mitral stenosis.   4. The aortic valve is normal in structure. Aortic valve regurgitation is  not visualized. No aortic stenosis is present.   5. The inferior vena cava is normal in size with greater than 50%  respiratory variability, suggesting right atrial pressure of 3 mmHg.   CCTA 09/16/2021 IMPRESSION: 1. No evidence of CAD, CADRADS = 0.   2. Coronary calcium  score of 0. This was 0 percentile for age and sex matched control.   3. Normal coronary origin with right dominance.   4. Consider non-coronary causes of chest pain. Physical Exam:   VS:  BP (!) 90/56   Pulse 78   Ht 5\' 6"  (1.676 m)   Wt 188 lb (85.3 kg)   SpO2 96%   BMI 30.34 kg/m    Wt Readings from Last 3 Encounters:  01/24/24 188 lb (85.3 kg)  01/10/24 187 lb (84.8 kg)  01/03/24 184 lb 0.6 oz (83.5 kg)    GEN: Well nourished, well developed in no acute distress NECK: No JVD; No carotid bruits CARDIAC:  RRR, no murmurs, rubs, gallops RESPIRATORY:  Clear to auscultation without rales, wheezing or rhonchi  ABDOMEN: Soft, non-tender, non-distended EXTREMITIES:  No edema; No deformity  ASSESSMENT AND PLAN: .   Assessment and Plan    Tobacco use disorder Continues to smoke 7 cigarettes daily. Smoking cessation is crucial for cardiovascular health and required by the surgeon before surgery. - Encouraged smoking cessation.  Hyperlipidemia Well-controlled with diet and Crestor . LDL is 81. - Continue Crestor  20 mg daily.  Meniere's disease Managed with triamterene . Concern for hypotension. Advised on potential blood pressure lowering and hydration. - Monitor blood pressure closely. - Ensure adequate  hydration.              Follow-up: Return if symptoms worsen or fail to improve.   Signed, Gigi Kyle. Rolm Clos, MD, [facility]  [facility]  [address], [address] [place], [place] [address] [phone]  2:46 PM

## 2024-01-23 ENCOUNTER — Ambulatory Visit (HOSPITAL_COMMUNITY)
Admission: RE | Admit: 2024-01-23 | Discharge: 2024-01-23 | Disposition: A | Discharge status: Home / Self Care | Source: Ambulatory Visit | Attending: Family Medicine | Admitting: Family Medicine

## 2024-01-23 ENCOUNTER — Encounter (HOSPITAL_COMMUNITY): Payer: Self-pay

## 2024-01-23 DIAGNOSIS — Z1231 Encounter for screening mammogram for malignant neoplasm of breast: Secondary | ICD-10-CM | POA: Insufficient documentation

## 2024-01-24 ENCOUNTER — Encounter (HOSPITAL_BASED_OUTPATIENT_CLINIC_OR_DEPARTMENT_OTHER): Payer: Self-pay | Admitting: Cardiovascular Disease

## 2024-01-24 ENCOUNTER — Other Ambulatory Visit: Payer: Self-pay | Admitting: Family Medicine

## 2024-01-24 ENCOUNTER — Ambulatory Visit (HOSPITAL_BASED_OUTPATIENT_CLINIC_OR_DEPARTMENT_OTHER): Admitting: Cardiovascular Disease

## 2024-01-24 VITALS — BP 90/56 | HR 78 | Ht 66.0 in | Wt 188.0 lb

## 2024-01-24 DIAGNOSIS — Z720 Tobacco use: Secondary | ICD-10-CM | POA: Diagnosis not present

## 2024-01-24 DIAGNOSIS — E782 Mixed hyperlipidemia: Secondary | ICD-10-CM

## 2024-01-24 DIAGNOSIS — R0981 Nasal congestion: Secondary | ICD-10-CM

## 2024-01-24 NOTE — Patient Instructions (Signed)
 Medication Instructions:  Your physician recommends that you continue on your current medications as directed. Please refer to the Current Medication list given to you today.  *If you need a refill on your cardiac medications before your next appointment, please call your pharmacy*  Follow-Up: At [facility], you and your health needs are our priority.  As part of our continuing mission to provide you with exceptional heart care, we have created designated Provider Care Teams.  These Care Teams include your primary Cardiologist (physician) and Advanced Practice Providers (APPs -  Physician Assistants and Nurse Practitioners) who all work together to provide you with the care you need, when you need it.  We recommend signing up for the patient portal called "MyChart".  Sign up information is provided on this After Visit Summary.  MyChart is used to connect with patients for Virtual Visits (Telemedicine).  Patients are able to view lab/test results, encounter notes, upcoming appointments, etc.  Non-urgent messages can be sent to your provider as well.   To learn more about what you can do with MyChart, go to [email or web address].    Your next appointment:   As needed  Provider:   Dr. Rolm Clos

## 2024-01-26 ENCOUNTER — Encounter (HOSPITAL_COMMUNITY): Payer: Self-pay | Admitting: Family Medicine

## 2024-01-26 ENCOUNTER — Other Ambulatory Visit (HOSPITAL_COMMUNITY): Payer: Self-pay | Admitting: Family Medicine

## 2024-01-26 DIAGNOSIS — R928 Other abnormal and inconclusive findings on diagnostic imaging of breast: Secondary | ICD-10-CM

## 2024-01-26 DIAGNOSIS — Z1231 Encounter for screening mammogram for malignant neoplasm of breast: Secondary | ICD-10-CM

## 2024-02-03 ENCOUNTER — Encounter (HOSPITAL_BASED_OUTPATIENT_CLINIC_OR_DEPARTMENT_OTHER): Payer: Self-pay

## 2024-02-03 ENCOUNTER — Ambulatory Visit (HOSPITAL_BASED_OUTPATIENT_CLINIC_OR_DEPARTMENT_OTHER): Payer: Medicare HMO | Admitting: Pulmonary Disease

## 2024-02-05 ENCOUNTER — Other Ambulatory Visit: Payer: Self-pay | Admitting: Family Medicine

## 2024-02-05 DIAGNOSIS — E7800 Pure hypercholesterolemia, unspecified: Secondary | ICD-10-CM

## 2024-02-09 ENCOUNTER — Encounter (HOSPITAL_COMMUNITY): Payer: Self-pay

## 2024-02-09 ENCOUNTER — Ambulatory Visit (INDEPENDENT_AMBULATORY_CARE_PROVIDER_SITE_OTHER): Admitting: Gastroenterology

## 2024-02-09 ENCOUNTER — Encounter (INDEPENDENT_AMBULATORY_CARE_PROVIDER_SITE_OTHER): Payer: Self-pay | Admitting: Gastroenterology

## 2024-02-09 VITALS — BP 100/66 | HR 85 | Temp 97.5°F | Ht 66.0 in | Wt 185.9 lb

## 2024-02-09 DIAGNOSIS — K439 Ventral hernia without obstruction or gangrene: Secondary | ICD-10-CM | POA: Insufficient documentation

## 2024-02-09 DIAGNOSIS — R112 Nausea with vomiting, unspecified: Secondary | ICD-10-CM

## 2024-02-09 MED ORDER — PROMETHAZINE HCL 12.5 MG PO TABS: 12.5000 mg | ORAL_TABLET | Freq: Three times a day (TID) | ORAL | 0 refills | Status: DC | PRN

## 2024-02-09 NOTE — H&P (View-Only) (Signed)
 Referring Provider: Towanda Fret, MD Primary Care Physician:  Towanda Fret, MD Primary GI Physician: Dr. Sammi Crick   Chief Complaint  Patient presents with   Emesis    Having nausea and vomiting for about one year. Doing better since stopping trucility when she saw Dr. Rodolph Clap. Was vomiting daily and since stopping med she thinks she has only vomited once. Has nausea every day.    HPI:   TENASIA TOMANEK is a 63 y.o. female with past medical history of depression, diabetes, GERD, HLD, Hypothyroidism, IBS-D, OSA, breast cancer in remission   Patient presenting today for:  Nausea and vomiting, ventral hernia   Last seen April 2023 for diarrhea, at that time having intermittent diarrhea, using imodium  PRN. Sometimes going a week without a BM, some LLQ pain. Unsure if she took previously prescribed xifaxan . Some nausea for the past month. GERD well controlled by omeprazole   Recommended to schedule EGD, zofran  PRN, try benefiber or metamucil, daily probiotic, imodium  PRN  Present:  Has had nausea and vomiting worse about 1 year after ozempic. She notes that PCP took her off of this April 1st as she thought this may be the cause. She is still having some nausea but maybe one episode of vomiting since coming off the medication. Not sure if eating changes her nausea. She reports she only eats once per day has done this since began. She denies changes in her appetite, or early satiety. She notes some weight loss when nausea and vomiting were worse but has gained some back. She has a lot of LUQ pain, worse when walking. She feels GERD is well controlled on omeprazole . She is taking excedrin 3-4 times per week,  naproxyn atleast once week for migraines. Has been doing this for about 6 months. PCP recently started on fioricet to get her off NSAIDs but this has not helped.   She notes she has a hernia in LLQ, was sent to [place] previously for surgical evaluation and states she could  not travel back there so did not have this done. Still bothered by this.    Last Colonoscopy:04/01/21- One 9 mm polyp in the cecum, (sessile serrated adenoma)Clips were placed- Two 3 to 5 mm polyps in the ascending colon(tubular adenoma) removed with a cold snare.- The distal rectum and anal verge are normal on retroflexion view. Last Endoscopy:02/2022- Normal hypopharynx.                           - Normal esophagus.                           - Z-line irregular, 34 cm from the incisors.                            Biopsied.                           - 2 cm hiatal hernia.                           - Gastritis. Biopsied.                           - Normal duodenal bulb and second portion of the  duodenum. Biopsied.   A. DUODENUM, BIOPSY:  - Duodenal mucosa with normal villous architecture.  - No villous atrophy or increased intraepithelial lymphocytes.   B. ANTRUM, BIOPSY:  - Antral mucosa with hyperemia.  - No Helicobacter pylori identified.   C. GE JUNCTION, BIOPSY:  - Gastroesophageal mucosa with slight inflammation consistent with  reflux.   Recommendations:  Repeat colonoscopy in June 2024  Past Medical History:  Diagnosis Date   Acute blood loss anemia 02/16/2012   Allergy    Arthritis    Phreesia 10/14/2020   Blood transfusion without reported diagnosis    Phreesia 10/14/2020   Breast cancer (HCC) 2007   History of  XRT, onTamoxifen   Bruises easily    Cancer (HCC)    Phreesia 10/14/2020   Depression    Depression    Phreesia 10/14/2020   Diabetes mellitus    prediabetic   Diabetes mellitus without complication (HCC)    Phreesia 10/14/2020   GERD (gastroesophageal reflux disease)    Hypertriglyceridemia    Hypothyroidism    following chemo amnd radiaition for breast cancer, needed replacement short term   Hypothyroidism (acquired)    replaced x 1 year   IBS (irritable bowel syndrome)    Menieres disease    Controlled with  triamterene     Migraines    Obesity    Palpitations    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HCC)    Sleep apnea 2010   Problems with CPAP    Past Surgical History:  Procedure Laterality Date   ABDOMINAL EXPOSURE N/A 12/03/2020   Procedure: ABDOMINAL EXPOSURE;  Surgeon: Mayo Speck, MD;  Location: [facility] OR;  Service: Vascular;  Laterality: N/A;   ABDOMINAL HYSTERECTOMY  1998   Benign, fibroids   ADRENALECTOMY  02/16/2012   [facility], splemic trauma, 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   ANTERIOR LUMBAR FUSION N/A 12/03/2020   Procedure: Lumbar Four-Five Lumbar Five Sacral One Anterior lumbar interbody fusion;  Surgeon: Audie Bleacher, MD;  Location: [facility] OR;  Service: Neurosurgery;  Laterality: N/A;   APPENDECTOMY  06/12/2008   BIOPSY  07/02/2020   Procedure: BIOPSY;  Surgeon: Urban Garden, MD;  Location: [facility];  Service: Gastroenterology;;   BIOPSY  01/22/2021   Procedure: BIOPSY;  Surgeon: Suzette Espy, MD;  [facility];  Service: Endoscopy;;   BIOPSY  02/17/2022   Procedure: BIOPSY;  Surgeon: Ruby Corporal, MD;  [facility];  Service: Endoscopy;;   BREAST LUMPECTOMY Left 2008   BREAST RECONSTRUCTION     Left reconstructive   BREAST SURGERY     Mammosite - right side   CHOLECYSTECTOMY  1985   COLONOSCOPY WITH PROPOFOL  N/A 07/02/2020   Procedure: COLONOSCOPY WITH PROPOFOL ;  Surgeon: Urban Garden, MD;  [facility];  Service: Gastroenterology;  Laterality: N/A;  1045   COLONOSCOPY WITH PROPOFOL  N/A 04/01/2021   Procedure: COLONOSCOPY WITH PROPOFOL ;  Surgeon: Urban Garden, MD;  [facility];  Service: Gastroenterology;  Laterality: N/A;  12:50   ESOPHAGEAL DILATION  12/19/2015   Procedure: ESOPHAGEAL DILATION;  Surgeon: Ruby Corporal, MD;  [facility];  Service: Endoscopy;;   ESOPHAGOGASTRODUODENOSCOPY N/A 12/19/2015   Procedure: ESOPHAGOGASTRODUODENOSCOPY (EGD);  Surgeon: Ruby Corporal, MD;   [facility];  Service: Endoscopy;  Laterality: N/A;  11:40   ESOPHAGOGASTRODUODENOSCOPY (EGD) WITH PROPOFOL  N/A 02/17/2022   Procedure: ESOPHAGOGASTRODUODENOSCOPY (EGD) WITH PROPOFOL ;  Surgeon: Ruby Corporal, MD;  [facility];  Service: Endoscopy;  Laterality: N/A;  1045   FLEXIBLE SIGMOIDOSCOPY N/A 01/22/2021   Procedure: FLEXIBLE SIGMOIDOSCOPY;  Surgeon: Suzette Espy, MD;  [facility];  Service: Endoscopy;  Laterality: N/A;   HEMOSTASIS CLIP PLACEMENT  04/01/2021   Procedure: HEMOSTASIS CLIP PLACEMENT;  Surgeon: Umberto Ganong, Bearl Limes, MD;  [facility];  Service: Gastroenterology;;  x2   POLYPECTOMY  07/02/2020   Procedure: POLYPECTOMY;  Surgeon: Umberto Ganong, Bearl Limes, MD;  [facility];  Service: Gastroenterology;;   POLYPECTOMY  04/01/2021   Procedure: POLYPECTOMY INTESTINAL;  Surgeon: Umberto Ganong, Bearl Limes, MD;  [facility];  Service: Gastroenterology;;   SPINE SURGERY     SPLENECTOMY, 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 6 each 3   fluticasone  (FLONASE ) 50 MCG/ACT nasal spray SPRAY 2 SPRAYS INTO EACH NOSTRIL EVERY DAY 48 mL 2   insulin  degludec (TRESIBA  FLEXTOUCH) 200 UNIT/ML FlexTouch Pen Inject 20 Units into the skin at bedtime. 12 mL 3   Insulin  Pen Needle (BD PEN NEEDLE NANO 2ND GEN) 32G X 4 MM MISC USE 4X DAILY  AS DIRECTED 400 each 1   Lancets (ONETOUCH DELICA PLUS LANCET33G) MISC USE TO TEST 4 TIMES A DAY 100 each 7   LORazepam  (ATIVAN ) 1 MG tablet Take one tablet by mouth once daily, as needed, for uncontrolled nausea 30 tablet 0   nicotine  (NICODERM CQ ) 7 mg/24hr patch Place 1 patch (7 mg total) onto the skin daily. 56 patch 0   nitrofurantoin, macrocrystal-monohydrate, (MACROBID) 100 MG capsule Take 1 capsule (100 mg total) by mouth 2 (two) times daily. 10 capsule 0   omeprazole  (PRILOSEC) 40 MG capsule TAKE 1 CAPSULE DAILY 90 capsule 1   ONETOUCH VERIO test strip USE AS DIRECTED 2 TIMES DAILY 200 strip 3   rosuvastatin  (CRESTOR ) 20 MG tablet TAKE 1 TABLET DAILY 90 tablet 1   temazepam  (RESTORIL ) 15 MG capsule TAKE 1 CAPSULE AT BEDTIME  AS NEEDED FOR SLEEP 90 capsule 0   temazepam  (RESTORIL ) 15 MG capsule Take 1 capsule (15 mg total) by mouth at bedtime as needed for sleep. 90 capsule 0   traZODone  (DESYREL ) 100 MG tablet TAKE 1 TABLET BY MOUTH EVERYDAY AT BEDTIME 90 tablet 1   triamterene -hydrochlorothiazide  (DYAZIDE ) 37.5-25 MG capsule TAKE 1 CAPSULE EVERY       MORNING 90 capsule 1   UNABLE TO FIND CPAP tubing and supplies, face mask Dx: G47.30 1 each 0   venlafaxine  XR (EFFEXOR -XR) 75 MG 24 hr capsule TAKE 1 CAPSULE BY MOUTH EVERY DAY 90 capsule 1   No current facility-administered medications for this visit.    Allergies as of 02/09/2024 - Review Complete 01/24/2024  Allergen Reaction Noted   Penicillins Swelling    Metformin  and related Diarrhea and Nausea Only 03/30/2015   Wound dressing adhesive Itching and Rash 09/09/2020   Jardiance [empagliflozin] Other (See  Comments) 02/29/2020   Statins Other (See Comments) 07/15/2014   Zetia  [ezetimibe ] Other (See Comments) 11/21/2014    Social History   Socioeconomic History   Marital status: Married    Spouse name: Brenita Callow   Number of children: 2   Years of education: Not on file   Highest education level: Some college, no degree   Occupational History   Not on file  Tobacco Use   Smoking status: Every Day    Current packs/day: 0.50    Average packs/day: 0.5 packs/day for 40.0 years (20.0 ttl pk-yrs)    Types: Cigarettes   Smokeless tobacco: Never   Tobacco comments:    1/2 pack a day    02/15/2023 Patient smokes about 1/2 pack daily  Vaping Use   Vaping status: Never Used  Substance and Sexual Activity   Alcohol use: No    Comment: Encouraged to quit smoking. She has tried the nicotone gum and patches but did not help   Drug use: No   Sexual activity: Not Currently    Birth control/protection: Surgical  Other Topics Concern   Not on file  Social History Narrative   Not on file   Social Drivers of Health   Financial Resource Strain: Medium Risk (01/09/2024)   Overall Financial Resource Strain (CARDIA)    Difficulty of Paying Living Expenses: Somewhat hard  Food Insecurity: Food Insecurity Present (01/09/2024)   Hunger Vital Sign    Worried About Running Out of Food in the Last Year: Sometimes true    Ran Out of Food in the Last Year: Sometimes true  Transportation Needs: No Transportation Needs (01/09/2024)   PRAPARE - Administrator, Civil Service (Medical): No    Lack of Transportation (Non-Medical): No  Physical Activity: Inactive (01/10/2024)   Exercise Vital Sign    Days of Exercise per Week: 0 days    Minutes of Exercise per Session: 0 min  Stress: No Stress Concern Present (01/09/2024)   [employer] of Occupational Health - Occupational Stress Questionnaire    Feeling of Stress : Only a little  Social Connections: Socially Integrated (01/09/2024)   Social Connection and Isolation Panel [NHANES]    Frequency of Communication with Friends and Family: More than three times a week    Frequency of Social Gatherings with Friends and Family: More than three times a week    Attends Religious Services: More than 4 times per year    Active Member of [employer] or Organizations: Yes    Attends  Banker Meetings: Never    Marital Status: Married    Review of systems General: negative for malaise, night sweats, fever, chills Neck: Negative for lumps, goiter, pain and significant neck swelling Resp: Negative for cough, wheezing, dyspnea at rest CV: Negative for chest pain, leg swelling, palpitations, orthopnea GI: denies melena, hematochezia, nausea, vomiting, diarrhea, constipation, dysphagia, odyonophagia, early satiety or unintentional weight loss. +nausea +vomiting +weight loss +LUQ pain +LLQ pain  The remainder of the review of systems is noncontributory.  Physical Exam: There were no vitals taken for this visit. General:   Alert and oriented. No distress noted. Pleasant and cooperative.  Head:  Normocephalic and atraumatic. Eyes:  Conjuctiva clear without scleral icterus. Mouth:  Oral mucosa pink and moist. Good dentition. No lesions. Heart: Normal rate and rhythm, s1 and s2 heart sounds present.  Lungs: Clear lung sounds in all lobes. Respirations equal and unlabored. Abdomen:  +BS, soft, and non-distended. TTP of LUQ and LLQ  with hernia noted. No rebound or guarding. No HSM or masses noted. Derm: No palmar erythema or jaundice Msk:  Symmetrical without gross deformities. Normal posture. Extremities:  Without edema. Neurologic:  Alert and  oriented x4 Psych:  Alert and cooperative. Normal mood and affect.  Invalid input(s): "6 MONTHS"   ASSESSMENT: JENNETH GILBERTI is a 63 y.o. female presenting today for nausea, vomiting and left sided abdominal pain  Patient with long history of nausea, improved some off of ozempic but continues with daily nausea, LUQ pain and intermittent vomiting. Has had weight loss. Taking NSAIDs frequently. Recommend proceeding with EGD for further evaluation as I cannot rule out PUD, gastritis, duodenitis, H pylori. For now should continue daily PPI, avoid NSAIDs, will send phenergan  12.5mg  Q8h for nausea. May consider GES if EGD  unremarkable. Last A1c only  mildly elevated at 6.1 Indications, risks and benefits of procedure discussed in detail with patient. Patient verbalized understanding and is in agreement to proceed with EGD  History of LLQ incisional hernia, last imaged in October 2024 with mesentery and bowel contained, seen by Dr. Larrie Po who referred her to [place] but she states she was seen there and refereed to another doctor in the practice and cannot travel to [place] so she never returned to follow up. Unclear if hernia is reparable by more local surgeon, given Dr. Larrie Po previous recommendations, however, will send referral to [facility] to see if they feel she would be a candidate for surgical repair by any of their surgeons.    PLAN: -avoid nsaids -continue PPI Daily -phenergan  12.5mg  Q8hr prn for nausea -EGD ASA II -referral to [facility] for possible hernia repair -consider GES if EGD unremarkable for causes of nausea and vomiting   All questions were answered, patient verbalized understanding and is in agreement with plan as outlined above.   Follow Up: 3 months   Naidelyn Parrella L. Venise Ellingwood, MSN, APRN, AGNP-C Adult-Gerontology Nurse Practitioner [facility]

## 2024-02-09 NOTE — Patient Instructions (Signed)
 Please continue omeprazole  40mg  daily I have sent phenergan  12.5mg  to take up to every 8 hours for nausea, be mindful this can cause drowsiness Try to avoid NSAIDs. (advil, aleve , naproxen , goody powder, ibuprofen)  We will get you scheduled for upper endoscopy for further evaluation of your symptoms We will send referral to [facility] [place] [facility] to see if they feel they can see you for your hernia  Follow up 3 months  It was a pleasure to see you today. I want to create trusting relationships with patients and provide genuine, compassionate, and quality care. I truly value your feedback! please be on the lookout for a survey regarding your visit with me today. I appreciate your input about our visit and your time in completing this!    Solymar Grace L. Remington Skalsky, MSN, APRN, AGNP-C Adult-Gerontology Nurse Practitioner [facility] at [facility]

## 2024-02-09 NOTE — Progress Notes (Addendum)
 Referring Provider: Towanda Fret, MD Primary Care Physician:  Towanda Fret, MD Primary GI Physician: Dr. Sammi Crick   Chief Complaint  Patient presents with   Emesis    Having nausea and vomiting for about one year. Doing better since stopping trucility when she saw Dr. Rodolph Clap. Was vomiting daily and since stopping med she thinks she has only vomited once. Has nausea every day.    HPI:   Annette [name] is a 63 y.o. female with past medical history of depression, diabetes, GERD, HLD, Hypothyroidism, IBS-D, OSA, breast cancer in remission   Patient presenting today for:  Nausea and vomiting, ventral hernia   Last seen April 2023 for diarrhea, at that time having intermittent diarrhea, using imodium  PRN. Sometimes going a week without a BM, some LLQ pain. Unsure if she took previously prescribed xifaxan . Some nausea for the past month. GERD well controlled by omeprazole   Recommended to schedule EGD, zofran  PRN, try benefiber or metamucil, daily probiotic, imodium  PRN  Present:  Has had nausea and vomiting worse about 1 year after ozempic. She notes that PCP took her off of this April 1st as she thought this may be the cause. She is still having some nausea but maybe one episode of vomiting since coming off the medication. Not sure if eating changes her nausea. She reports she only eats once per day has done this since began. She denies changes in her appetite, or early satiety. She notes some weight loss when nausea and vomiting were worse but has gained some back. She has a lot of LUQ pain, worse when walking. She feels GERD is well controlled on omeprazole . She is taking excedrin 3-4 times per week,  naproxyn atleast once week for migraines. Has been doing this for about 6 months. PCP recently started on fioricet to get her off NSAIDs but this has not helped.   She notes she has a hernia in LLQ, was sent to [place] previously for surgical evaluation and states she could  not travel back there so did not have this done. Still bothered by this.    Last Colonoscopy:04/01/21- One 9 mm polyp in the cecum, (sessile serrated adenoma)Clips were placed- Two 3 to 5 mm polyps in the ascending colon(tubular adenoma) removed with a cold snare.- The distal rectum and anal verge are normal on retroflexion view. Last Endoscopy:02/2022- Normal hypopharynx.                           - Normal esophagus.                           - Z-line irregular, 34 cm from the incisors.                            Biopsied.                           - 2 cm hiatal hernia.                           - Gastritis. Biopsied.                           - Normal duodenal bulb and second portion of the  duodenum. Biopsied.   A. DUODENUM, BIOPSY:  - Duodenal mucosa with normal villous architecture.  - No villous atrophy or increased intraepithelial lymphocytes.   B. ANTRUM, BIOPSY:  - Antral mucosa with hyperemia.  - No Helicobacter pylori identified.   C. GE JUNCTION, BIOPSY:  - Gastroesophageal mucosa with slight inflammation consistent with  reflux.   Recommendations:  Repeat colonoscopy in June 2024  Past Medical History:  Diagnosis Date   Acute blood loss anemia 02/16/2012   Allergy    Arthritis    Phreesia 10/14/2020   Blood transfusion without reported diagnosis    Phreesia 10/14/2020   Breast cancer (HCC) 2007   History of  XRT, onTamoxifen   Bruises easily    Cancer (HCC)    Phreesia 10/14/2020   Depression    Depression    Phreesia 10/14/2020   Diabetes mellitus    prediabetic   Diabetes mellitus without complication (HCC)    Phreesia 10/14/2020   GERD (gastroesophageal reflux disease)    Hypertriglyceridemia    Hypothyroidism    following chemo amnd radiaition for breast cancer, needed replacement short term   Hypothyroidism (acquired)    replaced x 1 year   IBS (irritable bowel syndrome)    Menieres disease    Controlled with  triamterene     Migraines    Obesity    Palpitations    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HCC)    Sleep apnea 2010   Problems with CPAP    Past Surgical History:  Procedure Laterality Date   ABDOMINAL EXPOSURE N/A 12/03/2020   Procedure: ABDOMINAL EXPOSURE;  Surgeon: Mayo Speck, MD;  Location: [facility] OR;  Service: Vascular;  Laterality: N/A;   ABDOMINAL HYSTERECTOMY  1998   Benign, fibroids   ADRENALECTOMY  02/16/2012   [facility], splemic trauma, 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   ANTERIOR LUMBAR FUSION N/A 12/03/2020   Procedure: Lumbar Four-Five Lumbar Five Sacral One Anterior lumbar interbody fusion;  Surgeon: Audie Bleacher, MD;  Location: [facility] OR;  Service: Neurosurgery;  Laterality: N/A;   APPENDECTOMY  06/12/2008   BIOPSY  07/02/2020   Procedure: BIOPSY;  Surgeon: Urban Garden, MD;  Location: [facility];  Service: Gastroenterology;;   BIOPSY  01/22/2021   Procedure: BIOPSY;  Surgeon: Suzette Espy, MD;  [facility];  Service: Endoscopy;;   BIOPSY  02/17/2022   Procedure: BIOPSY;  Surgeon: Ruby Corporal, MD;  [facility];  Service: Endoscopy;;   BREAST LUMPECTOMY Left 2008   BREAST RECONSTRUCTION     Left reconstructive   BREAST SURGERY     Mammosite - right side   CHOLECYSTECTOMY  1985   COLONOSCOPY WITH PROPOFOL  N/A 07/02/2020   Procedure: COLONOSCOPY WITH PROPOFOL ;  Surgeon: Urban Garden, MD;  [facility];  Service: Gastroenterology;  Laterality: N/A;  1045   COLONOSCOPY WITH PROPOFOL  N/A 04/01/2021   Procedure: COLONOSCOPY WITH PROPOFOL ;  Surgeon: Urban Garden, MD;  [facility];  Service: Gastroenterology;  Laterality: N/A;  12:50   ESOPHAGEAL DILATION  12/19/2015   Procedure: ESOPHAGEAL DILATION;  Surgeon: Ruby Corporal, MD;  [facility];  Service: Endoscopy;;   ESOPHAGOGASTRODUODENOSCOPY N/A 12/19/2015   Procedure: ESOPHAGOGASTRODUODENOSCOPY (EGD);  Surgeon: Ruby Corporal, MD;   [facility];  Service: Endoscopy;  Laterality: N/A;  11:40   ESOPHAGOGASTRODUODENOSCOPY (EGD) WITH PROPOFOL  N/A 02/17/2022   Procedure: ESOPHAGOGASTRODUODENOSCOPY (EGD) WITH PROPOFOL ;  Surgeon: Ruby Corporal, MD;  [facility];  Service: Endoscopy;  Laterality: N/A;  1045   FLEXIBLE SIGMOIDOSCOPY N/A 01/22/2021   Procedure: FLEXIBLE SIGMOIDOSCOPY;  Surgeon: Suzette Espy, MD;  [facility];  Service: Endoscopy;  Laterality: N/A;   HEMOSTASIS CLIP PLACEMENT  04/01/2021   Procedure: HEMOSTASIS CLIP PLACEMENT;  Surgeon: Umberto Ganong, Bearl Limes, MD;  [facility];  Service: Gastroenterology;;  x2   POLYPECTOMY  07/02/2020   Procedure: POLYPECTOMY;  Surgeon: Umberto Ganong, Bearl Limes, MD;  [facility];  Service: Gastroenterology;;   POLYPECTOMY  04/01/2021   Procedure: POLYPECTOMY INTESTINAL;  Surgeon: Umberto Ganong, Bearl Limes, MD;  [facility];  Service: Gastroenterology;;   SPINE SURGERY     SPLENECTOMY, 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 6 each 3   fluticasone  (FLONASE ) 50 MCG/ACT nasal spray SPRAY 2 SPRAYS INTO EACH NOSTRIL EVERY DAY 48 mL 2   insulin  degludec (TRESIBA  FLEXTOUCH) 200 UNIT/ML FlexTouch Pen Inject 20 Units into the skin at bedtime. 12 mL 3   Insulin  Pen Needle (BD PEN NEEDLE NANO 2ND GEN) 32G X 4 MM MISC USE 4X DAILY  AS DIRECTED 400 each 1   Lancets (ONETOUCH DELICA PLUS LANCET33G) MISC USE TO TEST 4 TIMES A DAY 100 each 7   LORazepam  (ATIVAN ) 1 MG tablet Take one tablet by mouth once daily, as needed, for uncontrolled nausea 30 tablet 0   nicotine  (NICODERM CQ ) 7 mg/24hr patch Place 1 patch (7 mg total) onto the skin daily. 56 patch 0   nitrofurantoin, macrocrystal-monohydrate, (MACROBID) 100 MG capsule Take 1 capsule (100 mg total) by mouth 2 (two) times daily. 10 capsule 0   omeprazole  (PRILOSEC) 40 MG capsule TAKE 1 CAPSULE DAILY 90 capsule 1   ONETOUCH VERIO test strip USE AS DIRECTED 2 TIMES DAILY 200 strip 3   rosuvastatin  (CRESTOR ) 20 MG tablet TAKE 1 TABLET DAILY 90 tablet 1   temazepam  (RESTORIL ) 15 MG capsule TAKE 1 CAPSULE AT BEDTIME  AS NEEDED FOR SLEEP 90 capsule 0   temazepam  (RESTORIL ) 15 MG capsule Take 1 capsule (15 mg total) by mouth at bedtime as needed for sleep. 90 capsule 0   traZODone  (DESYREL ) 100 MG tablet TAKE 1 TABLET BY MOUTH EVERYDAY AT BEDTIME 90 tablet 1   triamterene -hydrochlorothiazide  (DYAZIDE ) 37.5-25 MG capsule TAKE 1 CAPSULE EVERY       MORNING 90 capsule 1   UNABLE TO FIND CPAP tubing and supplies, face mask Dx: G47.30 1 each 0   venlafaxine  XR (EFFEXOR -XR) 75 MG 24 hr capsule TAKE 1 CAPSULE BY MOUTH EVERY DAY 90 capsule 1   No current facility-administered medications for this visit.    Allergies as of 02/09/2024 - Review Complete 01/24/2024  Allergen Reaction Noted   Penicillins Swelling    Metformin  and related Diarrhea and Nausea Only 03/30/2015   Wound dressing adhesive Itching and Rash 09/09/2020   Jardiance [empagliflozin] Other (See  Comments) 02/29/2020   Statins Other (See Comments) 07/15/2014   Zetia  [ezetimibe ] Other (See Comments) 11/21/2014    Social History   Socioeconomic History   Marital status: Married    Spouse name: Brenita Callow   Number of children: 2   Years of education: Not on file   Highest education level: Some college, no degree   Occupational History   Not on file  Tobacco Use   Smoking status: Every Day    Current packs/day: 0.50    Average packs/day: 0.5 packs/day for 40.0 years (20.0 ttl pk-yrs)    Types: Cigarettes   Smokeless tobacco: Never   Tobacco comments:    1/2 pack a day    02/15/2023 Patient smokes about 1/2 pack daily  Vaping Use   Vaping status: Never Used  Substance and Sexual Activity   Alcohol use: No    Comment: Encouraged to quit smoking. She has tried the nicotone gum and patches but did not help   Drug use: No   Sexual activity: Not Currently    Birth control/protection: Surgical  Other Topics Concern   Not on file  Social History Narrative   Not on file   Social Drivers of Health   Financial Resource Strain: Medium Risk (01/09/2024)   Overall Financial Resource Strain (CARDIA)    Difficulty of Paying Living Expenses: Somewhat hard  Food Insecurity: Food Insecurity Present (01/09/2024)   Hunger Vital Sign    Worried About Running Out of Food in the Last Year: Sometimes true    Ran Out of Food in the Last Year: Sometimes true  Transportation Needs: No Transportation Needs (01/09/2024)   PRAPARE - Administrator, Civil Service (Medical): No    Lack of Transportation (Non-Medical): No  Physical Activity: Inactive (01/10/2024)   Exercise Vital Sign    Days of Exercise per Week: 0 days    Minutes of Exercise per Session: 0 min  Stress: No Stress Concern Present (01/09/2024)   [employer] of Occupational Health - Occupational Stress Questionnaire    Feeling of Stress : Only a little  Social Connections: Socially Integrated (01/09/2024)   Social Connection and Isolation Panel [NHANES]    Frequency of Communication with Friends and Family: More than three times a week    Frequency of Social Gatherings with Friends and Family: More than three times a week    Attends Religious Services: More than 4 times per year    Active Member of [employer] or Organizations: Yes    Attends  Banker Meetings: Never    Marital Status: Married    Review of systems General: negative for malaise, night sweats, fever, chills Neck: Negative for lumps, goiter, pain and significant neck swelling Resp: Negative for cough, wheezing, dyspnea at rest CV: Negative for chest pain, leg swelling, palpitations, orthopnea GI: denies melena, hematochezia, nausea, vomiting, diarrhea, constipation, dysphagia, odyonophagia, early satiety or unintentional weight loss. +nausea +vomiting +weight loss +LUQ pain +LLQ pain  The remainder of the review of systems is noncontributory.  Physical Exam: There were no vitals taken for this visit. General:   Alert and oriented. No distress noted. Pleasant and cooperative.  Head:  Normocephalic and atraumatic. Eyes:  Conjuctiva clear without scleral icterus. Mouth:  Oral mucosa pink and moist. Good dentition. No lesions. Heart: Normal rate and rhythm, s1 and s2 heart sounds present.  Lungs: Clear lung sounds in all lobes. Respirations equal and unlabored. Abdomen:  +BS, soft, and non-distended. TTP of LUQ and LLQ  with hernia noted. No rebound or guarding. No HSM or masses noted. Derm: No palmar erythema or jaundice Msk:  Symmetrical without gross deformities. Normal posture. Extremities:  Without edema. Neurologic:  Alert and  oriented x4 Psych:  Alert and cooperative. Normal mood and affect.  Invalid input(s): "6 MONTHS"   ASSESSMENT: Annette [name] is a 63 y.o. female presenting today for nausea, vomiting and left sided abdominal pain  Patient with long history of nausea, improved some off of ozempic but continues with daily nausea, LUQ pain and intermittent vomiting. Has had weight loss. Taking NSAIDs frequently. Recommend proceeding with EGD for further evaluation as I cannot rule out PUD, gastritis, duodenitis, H pylori. For now should continue daily PPI, avoid NSAIDs, will send phenergan  12.5mg  Q8h for nausea. May consider GES if EGD  unremarkable. Last A1c only  mildly elevated at 6.1 Indications, risks and benefits of procedure discussed in detail with patient. Patient verbalized understanding and is in agreement to proceed with EGD  History of LLQ incisional hernia, last imaged in October 2024 with mesentery and bowel contained, seen by Dr. Larrie Po who referred her to [place] but she states she was seen there and refereed to another doctor in the practice and cannot travel to [place] so she never returned to follow up. Unclear if hernia is reparable by more local surgeon, given Dr. Larrie Po previous recommendations, however, will send referral to [facility] to see if they feel she would be a candidate for surgical repair by any of their surgeons.    PLAN: -avoid nsaids -continue PPI Daily -phenergan  12.5mg  Q8hr prn for nausea -EGD ASA II -referral to [facility] for possible hernia repair -consider GES if EGD unremarkable for causes of nausea and vomiting   All questions were answered, patient verbalized understanding and is in agreement with plan as outlined above.   Follow Up: 3 months   Schae Cando L. Adrien Alberta, MSN, APRN, AGNP-C Adult-Gerontology Nurse Practitioner [facility]  I have reviewed the note and agree with the APP's assessment as described in this progress note  Samantha Cress, MD Gastroenterology and Hepatology [facility] Gastroenterology

## 2024-02-16 ENCOUNTER — Ambulatory Visit (HOSPITAL_COMMUNITY)
Admission: RE | Admit: 2024-02-16 | Discharge: 2024-02-16 | Disposition: A | Discharge status: Home / Self Care | Source: Ambulatory Visit | Attending: Family Medicine | Admitting: Family Medicine

## 2024-02-16 ENCOUNTER — Encounter (HOSPITAL_COMMUNITY): Payer: Self-pay

## 2024-02-16 ENCOUNTER — Other Ambulatory Visit (HOSPITAL_COMMUNITY): Payer: Self-pay | Admitting: Family Medicine

## 2024-02-16 DIAGNOSIS — R928 Other abnormal and inconclusive findings on diagnostic imaging of breast: Secondary | ICD-10-CM | POA: Diagnosis present

## 2024-02-16 HISTORY — DX: Personal history of irradiation: Z92.3

## 2024-02-17 ENCOUNTER — Ambulatory Visit: Payer: Self-pay | Admitting: Family Medicine

## 2024-02-20 ENCOUNTER — Other Ambulatory Visit (HOSPITAL_COMMUNITY): Payer: Self-pay | Admitting: Family Medicine

## 2024-02-20 ENCOUNTER — Telehealth: Payer: Self-pay

## 2024-02-20 DIAGNOSIS — R928 Other abnormal and inconclusive findings on diagnostic imaging of breast: Secondary | ICD-10-CM

## 2024-02-20 MED ORDER — FREESTYLE LIBRE 3 PLUS SENSOR MISC: 1.0000 | 3 refills | Status: DC

## 2024-02-20 NOTE — Telephone Encounter (Signed)
Requested Prescriptions   Signed Prescriptions Disp Refills   Continuous Glucose Sensor (FREESTYLE LIBRE 3 PLUS SENSOR) MISC 6 each 3    Sig: Inject 1 Device into the skin continuous. Change every 15 days    Authorizing Provider: Carlus Pavlov    Ordering User: Pollie Meyer

## 2024-02-21 ENCOUNTER — Ambulatory Visit (HOSPITAL_COMMUNITY)
Admission: RE | Admit: 2024-02-21 | Discharge: 2024-02-21 | Disposition: A | Discharge status: Home / Self Care | Source: Ambulatory Visit | Attending: Family Medicine | Admitting: Family Medicine

## 2024-02-21 ENCOUNTER — Encounter (HOSPITAL_COMMUNITY): Payer: Self-pay

## 2024-02-21 DIAGNOSIS — R928 Other abnormal and inconclusive findings on diagnostic imaging of breast: Secondary | ICD-10-CM | POA: Insufficient documentation

## 2024-02-21 HISTORY — PX: BREAST BIOPSY: SHX20

## 2024-02-21 MED ORDER — LIDOCAINE-EPINEPHRINE (PF) 1 %-1:200000 IJ SOLN
10.0000 mL | Freq: Once | INTRAMUSCULAR | Status: AC
  Administered 2024-02-21: 10 mL via INTRADERMAL

## 2024-02-21 MED ORDER — LIDOCAINE-EPINEPHRINE (PF) 1 %-1:200000 IJ SOLN
INTRAMUSCULAR | Status: AC
  Filled 2024-02-21: qty 30

## 2024-02-21 MED ORDER — LIDOCAINE HCL (PF) 2 % IJ SOLN
10.0000 mL | Freq: Once | INTRAMUSCULAR | Status: AC
  Administered 2024-02-21: 10 mL

## 2024-02-21 MED ORDER — LIDOCAINE HCL (PF) 2 % IJ SOLN
INTRAMUSCULAR | Status: AC
  Filled 2024-02-21: qty 10

## 2024-02-21 NOTE — Progress Notes (Signed)
 PT tolerated Left breast biopsy well today with NAD noted. PT verbalized understanding of discharge instructions and given ice packs to use at home. PT ambulated back to the mammogram area this time. Specimens taken to lab by Colette from ultrasound at this time.

## 2024-02-22 ENCOUNTER — Other Ambulatory Visit: Payer: Self-pay | Admitting: Diagnostic Radiology

## 2024-02-23 LAB — SURGICAL PATHOLOGY

## 2024-02-28 ENCOUNTER — Ambulatory Visit (HOSPITAL_COMMUNITY): Admitting: Anesthesiology

## 2024-02-28 ENCOUNTER — Other Ambulatory Visit: Payer: Self-pay

## 2024-02-28 ENCOUNTER — Ambulatory Visit (HOSPITAL_COMMUNITY)
Admission: RE | Admit: 2024-02-28 | Discharge: 2024-02-28 | Disposition: A | Discharge status: Home / Self Care | Attending: Gastroenterology | Admitting: Gastroenterology

## 2024-02-28 ENCOUNTER — Encounter (HOSPITAL_COMMUNITY)
Admission: RE | Disposition: A | Discharge status: Home / Self Care | Payer: Self-pay | Source: Home / Self Care | Attending: Gastroenterology

## 2024-02-28 DIAGNOSIS — D649 Anemia, unspecified: Secondary | ICD-10-CM | POA: Diagnosis not present

## 2024-02-28 DIAGNOSIS — J449 Chronic obstructive pulmonary disease, unspecified: Secondary | ICD-10-CM | POA: Diagnosis not present

## 2024-02-28 DIAGNOSIS — I10 Essential (primary) hypertension: Secondary | ICD-10-CM | POA: Diagnosis not present

## 2024-02-28 DIAGNOSIS — R634 Abnormal weight loss: Secondary | ICD-10-CM | POA: Diagnosis not present

## 2024-02-28 DIAGNOSIS — Z6830 Body mass index (BMI) 30.0-30.9, adult: Secondary | ICD-10-CM | POA: Diagnosis not present

## 2024-02-28 DIAGNOSIS — Z794 Long term (current) use of insulin: Secondary | ICD-10-CM | POA: Insufficient documentation

## 2024-02-28 DIAGNOSIS — R112 Nausea with vomiting, unspecified: Secondary | ICD-10-CM

## 2024-02-28 DIAGNOSIS — F17210 Nicotine dependence, cigarettes, uncomplicated: Secondary | ICD-10-CM | POA: Insufficient documentation

## 2024-02-28 DIAGNOSIS — R1032 Left lower quadrant pain: Secondary | ICD-10-CM

## 2024-02-28 DIAGNOSIS — E669 Obesity, unspecified: Secondary | ICD-10-CM | POA: Insufficient documentation

## 2024-02-28 DIAGNOSIS — H8109 Meniere's disease, unspecified ear: Secondary | ICD-10-CM | POA: Insufficient documentation

## 2024-02-28 DIAGNOSIS — F17200 Nicotine dependence, unspecified, uncomplicated: Secondary | ICD-10-CM | POA: Diagnosis not present

## 2024-02-28 DIAGNOSIS — Z79899 Other long term (current) drug therapy: Secondary | ICD-10-CM | POA: Diagnosis not present

## 2024-02-28 DIAGNOSIS — K219 Gastro-esophageal reflux disease without esophagitis: Secondary | ICD-10-CM | POA: Insufficient documentation

## 2024-02-28 DIAGNOSIS — G4730 Sleep apnea, unspecified: Secondary | ICD-10-CM | POA: Diagnosis not present

## 2024-02-28 DIAGNOSIS — K449 Diaphragmatic hernia without obstruction or gangrene: Secondary | ICD-10-CM | POA: Insufficient documentation

## 2024-02-28 DIAGNOSIS — F32A Depression, unspecified: Secondary | ICD-10-CM | POA: Insufficient documentation

## 2024-02-28 DIAGNOSIS — R519 Headache, unspecified: Secondary | ICD-10-CM | POA: Insufficient documentation

## 2024-02-28 DIAGNOSIS — Z860101 Personal history of adenomatous and serrated colon polyps: Secondary | ICD-10-CM | POA: Insufficient documentation

## 2024-02-28 DIAGNOSIS — K2970 Gastritis, unspecified, without bleeding: Secondary | ICD-10-CM | POA: Diagnosis not present

## 2024-02-28 DIAGNOSIS — F419 Anxiety disorder, unspecified: Secondary | ICD-10-CM | POA: Insufficient documentation

## 2024-02-28 DIAGNOSIS — E119 Type 2 diabetes mellitus without complications: Secondary | ICD-10-CM | POA: Diagnosis not present

## 2024-02-28 DIAGNOSIS — K439 Ventral hernia without obstruction or gangrene: Secondary | ICD-10-CM | POA: Insufficient documentation

## 2024-02-28 DIAGNOSIS — R0609 Other forms of dyspnea: Secondary | ICD-10-CM | POA: Diagnosis not present

## 2024-02-28 DIAGNOSIS — I890 Lymphedema, not elsewhere classified: Secondary | ICD-10-CM | POA: Diagnosis not present

## 2024-02-28 DIAGNOSIS — E039 Hypothyroidism, unspecified: Secondary | ICD-10-CM | POA: Diagnosis not present

## 2024-02-28 DIAGNOSIS — K2289 Other specified disease of esophagus: Secondary | ICD-10-CM | POA: Insufficient documentation

## 2024-02-28 DIAGNOSIS — G4733 Obstructive sleep apnea (adult) (pediatric): Secondary | ICD-10-CM | POA: Insufficient documentation

## 2024-02-28 HISTORY — PX: ESOPHAGOGASTRODUODENOSCOPY: SHX5428

## 2024-02-28 LAB — GLUCOSE, CAPILLARY: Glucose-Capillary: 150 mg/dL — ABNORMAL HIGH (ref 70–99)

## 2024-02-28 SURGERY — EGD (ESOPHAGOGASTRODUODENOSCOPY)
Anesthesia: General

## 2024-02-28 MED ORDER — LACTATED RINGERS IV SOLN: INTRAVENOUS | Status: DC

## 2024-02-28 MED ORDER — PROPOFOL 500 MG/50ML IV EMUL
INTRAVENOUS | Status: DC | PRN
  Administered 2024-02-28: 150 ug/kg/min via INTRAVENOUS

## 2024-02-28 MED ORDER — PROPOFOL 10 MG/ML IV BOLUS
INTRAVENOUS | Status: DC | PRN
  Administered 2024-02-28: 100 mg via INTRAVENOUS

## 2024-02-28 MED ORDER — LIDOCAINE 2% (20 MG/ML) 5 ML SYRINGE
INTRAMUSCULAR | Status: DC | PRN
  Administered 2024-02-28: 100 mg via INTRAVENOUS

## 2024-02-28 NOTE — Op Note (Signed)
 [facility] Patient Name: Annette [name] Procedure Date: 02/28/2024 11:26 AM MRN: [id number] Date of Birth: 01-Nov-1960 Attending MD: Terril Fetters , MD, [id number] CSN: [id number] Age: 63 Admit Type: Outpatient Procedure:                Upper GI endoscopy Indications:              Abdominal pain in the left lower quadrant, Nausea                            with vomiting, Weight loss Providers:                Terril Fetters, MD, Alisa App, Crystal Page,                            Annell Barrow Referring MD:              Medicines:                Monitored Anesthesia Care Complications:            No immediate complications. Estimated Blood Loss:     Estimated blood loss was minimal. Procedure:                Pre-Anesthesia Assessment:                           - Prior to the procedure, a History and Physical                            was performed, and patient medications and                            allergies were reviewed. The patient's tolerance of                            previous anesthesia was also reviewed. The risks                            and benefits of the procedure and the sedation                            options and risks were discussed with the patient.                            All questions were answered, and informed consent                            was obtained. Prior Anticoagulants: The patient has                            taken no anticoagulant or antiplatelet agents. ASA                            Grade Assessment: II - A patient with mild systemic  disease. After reviewing the risks and benefits,                            the patient was deemed in satisfactory condition to                            undergo the procedure.                           After obtaining informed consent, the endoscope was                            passed under direct vision. Throughout the                            procedure,  the patient's blood pressure, pulse, and                            oxygen saturations were monitored continuously. The                            GIF-H190 ([id number]) scope was introduced through the                            mouth, and advanced to the second part of duodenum.                            The upper GI endoscopy was accomplished without                            difficulty. The patient tolerated the procedure                            well. Scope In: 11:51:16 AM Scope Out: 11:58:48 AM Total Procedure Duration: 0 hours 7 minutes 32 seconds  Findings:      The Z-line was irregular. Biopsies were taken with a cold forceps for       histology.      Mild inflammation characterized by erythema was found in the gastric       antrum. Biopsies were taken with a cold forceps for histology.      Lymphangiectasia was present in the duodenal bulb and in the second       portion of the duodenum. Biopsies for histology were taken with a cold       forceps for evaluation of celiac disease. Impression:               - Z-line irregular. Biopsied.                           - Gastritis. Biopsied.                           - Duodenal mucosal lymphangiectasia. Moderate Sedation:      Per Anesthesia Care Recommendation:           - Patient has a contact number available for  emergencies. The signs and symptoms of potential                            delayed complications were discussed with the                            patient. Return to normal activities tomorrow.                            Written discharge instructions were provided to the                            patient.                           - Resume previous diet.                           - Continue present medications.                           - Await pathology results.                           -If symptoms continue consider obatining Gastric                            emptying study and CT  Abdomen and pelvis Procedure Code(s):        --- Professional ---                           [phone], Esophagogastroduodenoscopy, flexible,                            transoral; with biopsy, single or multiple Diagnosis Code(s):        --- Professional ---                           K22.89, Other specified disease of esophagus                           K29.70, Gastritis, unspecified, without bleeding                           I89.0, Lymphedema, not elsewhere classified                           R10.32, Left lower quadrant pain                           R11.2, Nausea with vomiting, unspecified                           R63.4, Abnormal weight loss CPT copyright 2022 American Medical Association. All rights reserved. The codes documented in this report are preliminary and upon coder review may  be revised to meet current compliance requirements. Rosendo Couser, MD Terril Fetters, MD  02/28/2024 12:05:13 PM This report has been signed electronically. Number of Addenda: 0

## 2024-02-28 NOTE — Anesthesia Procedure Notes (Signed)
 Date/Time: 02/28/2024 11:45 AM  Performed by: Sherwin Donate, CRNAPre-anesthesia Checklist: Patient identified, Emergency Drugs available, Patient being monitored and Suction available Patient Re-evaluated:Patient Re-evaluated prior to induction Oxygen Delivery Method: Nasal cannula Induction Type: IV induction Placement Confirmation: positive ETCO2 Comments: Optiflow High Flow [place] O2 used.

## 2024-02-28 NOTE — Transfer of Care (Signed)
 Immediate Anesthesia Transfer of Care Note  Patient: Annette [name]  Procedure(s) Performed: EGD (ESOPHAGOGASTRODUODENOSCOPY)  Patient Location: Endoscopy Unit  Anesthesia Type:General  Level of Consciousness: drowsy  Airway & Oxygen Therapy: Patient Spontanous Breathing  Post-op Assessment: Report given to RN and Post -op Vital signs reviewed and stable  Post vital signs: Reviewed and stable  Last Vitals:  Vitals Value Taken Time  BP    Temp    Pulse    Resp    SpO2      Last Pain:  Vitals:   02/28/24 1146  TempSrc:   PainSc: 0-No pain      Patients Stated Pain Goal: 10 (02/28/24 1048)  Complications: No notable events documented.

## 2024-02-28 NOTE — Interval H&P Note (Signed)
 History and Physical Interval Note:  02/28/2024 11:30 AM  Annette [name]  has presented today for surgery, with the diagnosis of NAUSEA, WEIGHT LOSS.  The various methods of treatment have been discussed with the patient and family. After consideration of risks, benefits and other options for treatment, the patient has consented to  Procedure(s) with comments: EGD (ESOPHAGOGASTRODUODENOSCOPY) (N/A) - 11:30AM;ASA 2 as a surgical intervention.  The patient's history has been reviewed, patient examined, no change in status, stable for surgery.  I have reviewed the patient's chart and labs.  Questions were answered to the patient's satisfaction.     Forbes Ida Gwendolen Hewlett

## 2024-02-28 NOTE — Anesthesia Preprocedure Evaluation (Addendum)
 Anesthesia Evaluation  Patient identified by MRN, date of birth, ID band Patient awake    Reviewed: Allergy & Precautions, H&P , NPO status , Patient's Chart, lab work & pertinent test results, reviewed documented beta blocker date and time   Airway Mallampati: II  TM Distance: >3 FB Neck ROM: full    Dental no notable dental hx. (+) Caps   Pulmonary sleep apnea , COPD, Current Smoker   Pulmonary exam normal breath sounds clear to auscultation       Cardiovascular Exercise Tolerance: Good hypertension, + DOE   Rhythm:regular Rate:Normal     Neuro/Psych  Headaches PSYCHIATRIC DISORDERS Anxiety Depression     Neuromuscular disease    GI/Hepatic Neg liver ROS,GERD  ,,  Endo/Other  diabetesHypothyroidism    Renal/GU negative Renal ROS  negative genitourinary   Musculoskeletal   Abdominal   Peds  Hematology  (+) Blood dyscrasia, anemia   Anesthesia Other Findings Caps present, left front lower incisor, intact  Reproductive/Obstetrics negative OB ROS                             Anesthesia Physical Anesthesia Plan  ASA: 2  Anesthesia Plan: General   Post-op Pain Management:    Induction:   PONV Risk Score and Plan: Propofol  infusion  Airway Management Planned:   Additional Equipment:   Intra-op Plan:   Post-operative Plan:   Informed Consent: I have reviewed the patients History and Physical, chart, labs and discussed the procedure including the risks, benefits and alternatives for the proposed anesthesia with the patient or authorized representative who has indicated his/her understanding and acceptance.     Dental Advisory Given  Plan Discussed with: CRNA  Anesthesia Plan Comments:        Anesthesia Quick Evaluation

## 2024-02-28 NOTE — Discharge Instructions (Signed)

## 2024-02-29 ENCOUNTER — Encounter (HOSPITAL_COMMUNITY): Payer: Self-pay | Admitting: Gastroenterology

## 2024-02-29 LAB — SURGICAL PATHOLOGY

## 2024-03-04 NOTE — Anesthesia Postprocedure Evaluation (Signed)
 Anesthesia Post Note  Patient: Annette [name]  Procedure(s) Performed: EGD (ESOPHAGOGASTRODUODENOSCOPY)  Patient location during evaluation: Phase II Anesthesia Type: General Level of consciousness: awake Pain management: pain level controlled Vital Signs Assessment: post-procedure vital signs reviewed and stable Respiratory status: spontaneous breathing and respiratory function stable Cardiovascular status: blood pressure returned to baseline and stable Postop Assessment: no headache and no apparent nausea or vomiting Anesthetic complications: no Comments: Late entry   No notable events documented.   Last Vitals:  Vitals:   02/28/24 1057 02/28/24 1201  BP: 127/62 108/64  Pulse: 86 83  Resp: 19 17  Temp: 36.8 C 36.4 C  SpO2: 98% 94%    Last Pain:  Vitals:   02/28/24 1201  TempSrc: Oral  PainSc: 0-No pain                 Coretha Dew

## 2024-03-06 ENCOUNTER — Ambulatory Visit (INDEPENDENT_AMBULATORY_CARE_PROVIDER_SITE_OTHER): Payer: Self-pay | Admitting: Gastroenterology

## 2024-03-09 ENCOUNTER — Other Ambulatory Visit: Payer: Self-pay | Admitting: Internal Medicine

## 2024-03-09 ENCOUNTER — Other Ambulatory Visit: Payer: Self-pay | Admitting: Family Medicine

## 2024-03-09 DIAGNOSIS — E1165 Type 2 diabetes mellitus with hyperglycemia: Secondary | ICD-10-CM

## 2024-03-09 DIAGNOSIS — E785 Hyperlipidemia, unspecified: Secondary | ICD-10-CM

## 2024-03-11 ENCOUNTER — Other Ambulatory Visit: Payer: Self-pay | Admitting: Family Medicine

## 2024-03-11 DIAGNOSIS — F5104 Psychophysiologic insomnia: Secondary | ICD-10-CM

## 2024-03-13 ENCOUNTER — Encounter: Payer: Self-pay | Admitting: Internal Medicine

## 2024-03-13 ENCOUNTER — Ambulatory Visit: Payer: Medicare HMO | Admitting: Internal Medicine

## 2024-03-13 VITALS — BP 120/68 | HR 67 | Ht 66.0 in | Wt 189.2 lb

## 2024-03-13 DIAGNOSIS — Z794 Long term (current) use of insulin: Secondary | ICD-10-CM

## 2024-03-13 DIAGNOSIS — E1165 Type 2 diabetes mellitus with hyperglycemia: Secondary | ICD-10-CM

## 2024-03-13 DIAGNOSIS — E278 Other specified disorders of adrenal gland: Secondary | ICD-10-CM | POA: Diagnosis not present

## 2024-03-13 DIAGNOSIS — E782 Mixed hyperlipidemia: Secondary | ICD-10-CM | POA: Diagnosis not present

## 2024-03-13 LAB — POCT GLYCOSYLATED HEMOGLOBIN (HGB A1C): Hemoglobin A1C: 6.2 % — AB (ref 4.0–5.6)

## 2024-03-13 MED ORDER — SITAGLIPTIN PHOSPHATE 100 MG PO TABS
100.0000 mg | ORAL_TABLET | Freq: Every day | ORAL | 3 refills | Status: DC
Start: 2024-03-13 — End: 2024-05-08

## 2024-03-13 MED ORDER — TRESIBA FLEXTOUCH 200 UNIT/ML ~~LOC~~ SOPN: 18.0000 [IU] | PEN_INJECTOR | Freq: Every day | SUBCUTANEOUS | 1 refills | Status: DC

## 2024-03-13 NOTE — Progress Notes (Signed)
 Patient ID: Annette Annette [name], female   DOB: 10-Apr-1961, 63 y.o.   MRN: [id number]   HPI: Annette Annette [name] is a 63 y.o.-year-old female, initially referred by her PCP, Annette Annette [name], returning for follow-up for DM2, dx in 2018, insulin -dependent since 03/2019, uncontrolled, without long-term complications.  She previously saw Annette Annette [name] and Annette Annette [name] but then switched to seeing me.  Last visit 4.5 months ago.  She is here with her husband.  Interim history: She continues to have some nausea despite stopping Trulicity  approximately 2 months ago (due to severe nausea and vomiting).  No blurry vision, increased urination, chest pain.  Reviewed HbA1c levels: Lab Results  Component Value Date   HGBA1C 6.1 (A) 10/20/2023   HGBA1C 6.5 (A) 06/20/2023   HGBA1C 6.8 (A) 02/17/2023   HGBA1C 7.4 (A) 10/19/2022   HGBA1C 7.1 (A) 06/14/2022   HGBA1C 6.9 (A) 02/08/2022   HGBA1C 6.7 (H) 01/26/2022   HGBA1C 5.9 (A) 05/19/2021   HGBA1C 6.0 (H) 01/17/2021   HGBA1C 6.4 (H) 12/03/2020  06/14/2022: HbA1c calculated from fructosamine normal, at 6.27%, correlating Annette [name] with blood sugar checks at home.  Previously on: - Glipizide  XL 20 mg before breakfast - Lantus  50 units at bedtime (dose increased 04/08/2020) She could not tolerate Metformin  due to GI intolerance - abdominal pain and diarrhea. She had vaginal yeast infections and UTIs from Annette Annette [name].  Currently on: - Trulicity  0.75 >> 1.5 mg weekly - started 04/2020 >> stopped by PCP 2/2 N/V 01/2024 - Tresiba  50 >> ... 26 >> 20 >> 24 units daily We stopped glipizide  XL 01/2021.  Pt checks her sugars >4x a day:  Prev.:   Lowest sugar was 43 (am) ...>> 59 >> 50s; she has hypoglycemia awareness in the 90s.  Highest sugar was 425 ... >> 240s >> 130s >> 200s.  Glucometer: One Touch verio  She is again only eating 1 meal a day per husband's report She only drinks unsweetened tea and coffee. She eats a Healthy Choice for lunch and occasionally  dinner.  -She has no history of CKD, last BUN/creatinine:  Lab Results  Component Value Date   BUN 17 05/31/2023   BUN 18 05/19/2023   CREATININE 1.04 (H) 05/31/2023   CREATININE 1.23 (H) 05/19/2023   Lab Results  Component Value Date   MICRALBCREAT 14 10/20/2023   MICRALBCREAT 18 09/14/2022   MICRALBCREAT <8 04/23/2021   MICRALBCREAT <6.0 09/16/2017   MICRALBCREAT 2.1 11/24/2011  Not on ACE inhibitor/ARB.  + HL; last set of lipids: Lab Results  Component Value Date   CHOL 150 01/03/2024   HDL 51 01/03/2024   LDLCALC 81 01/03/2024   TRIG 98 01/03/2024   CHOLHDL 2.4 05/19/2023  On Annette [name]  20. She was also on Annette [name]  but she did not feel well on it >> stopped.  - last eye exam was in 2025: No DR reportedly.  At last visit she described occasional double vision >> glasses were too strong.  I referred her to Annette Annette [name].  - no numbness but some pain and tingling in her right foot -after her back surgery.  Last foot exam 02/17/2023.  Pt has FH of DM in mother, brother, sister.  She also has a history of adrenal adenomas: - she had previous adrenalectomy by Dr. 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   Received labeled "left  adrenal gland" is a 30 g adrenal gland  which is received transected by the surgeon which, when  reapproximated has an overall measurement of 8.5 x 4.5 x 2.8 cm.  The cut surface reveals a central well-circumscribed, yellow  cortical nodule measuring 4 x 3.5 x 2.8 cm. The nodule is focally  compressing the adrenal gland. Within the surrounding soft tissue  there is a 0.3 cm yellow nodule which has a similar appearance to  the cortical nodule representative sections are submitted as  follows:   -  she was found to have 2 adrenal adenomas, which is investigated by oncology.  They had negative Hounsfield units on CT,  consistent with benign nodules.  Hormonal investigation was negative: Component     Latest Ref Rng 09/07/2021 09/09/2021 11/02/2021  Epinephrine , Rand Ur     Undefined ug/L  1    Epinephrine , U, 24Hr     0 - 20 ug/24 hr  2    Norepinephrine, Rand Ur     Undefined ug/L  19    Norepinephrine,U,24H     0 - 135 ug/24 hr  29    Dopamine, Rand Ur     Undefined ug/L  104    Dopamine, Ur, 24Hr     0 - 510 ug/24 hr  156    Total Volume  1,500    Total Volume  1,500    Normetanephrine, Ur     Undefined ug/L  142    Normetanephr.,U,24h     131 - 612 ug/24 hr  213    Metanephrine, Ur     Undefined ug/L  26    Metanephrines, 24H Ur     36 - 209 ug/24 hr  39    Norepinephrine     0 - 874 pg/mL 745     Epinephrine      0 - 62 pg/mL 19     Dopamine     0 - 48 pg/mL 40     ALDOSTERONE      ng/dL   27   Renin Activity     0.25 - 5.82 ng/mL/h   13.41 (H)   ALDO / PRA Ratio     0.9 - 28.9 Ratio   2.0   Normetanephrine, Pl     0.0 - 285.2 pg/mL 106.9     Metanephrine, Pl     0.0 - 88.0 pg/mL 10.9     C206 ACTH      6 - 50 pg/mL 4.2 (L)   23   Cortisol, Plasma     ug/dL 6.6   16.1   Cortisol, Plasma        37.2   Cortisol, Plasma        24.4   Potassium     3.5 - 5.1 mEq/L   3.8    Dexamethasone  suppression test was normal: Component     Latest Ref Rng 12/03/2021  Dexamethasone , Serum     ng/dL 096   Cortisol - AM     mcg/dL 2.6 (L)     Previous investigation was also negative: Component     Latest Ref Rng 08/17/2011  Total Volume - CF 24Hr U     mL 1300   Epinephrine , 24 hr Urine     2 - 24 mcg/24 h 4   Norepinephrine, 24 hr Ur     15 - 100 mcg/24 h 39   Calculated Total (E+NE)     26 - 121 mcg/24 h 43  Dopamine, 24 hr Urine     52 - 480 mcg/24 h 139   Creatinine, Urine mg/day-CATEUR     0.63 - 2.50 g/24 h 1.34   Normetanephrine, Ur     Undefined ug/L   Normetanephr.,U,24h     131 - 612 ug/24 hr 318   Metanephrine, Ur     Undefined ug/L 422   Metanephrines, 24H  Ur     36 - 209 ug/24 hr   Metanephrines, Ur     90 - 315 mcg/24 h 104    Component     Latest Ref Rng 08/17/2011  Cortisol, Plasma     ug/dL 78.2   Aldosterone, Serum      ng/dL 23    95/62/1308: Component Ref Range & Units 10 yr ago  Total Volume - CF 24Hr U mL 1300   Epinephrine , 24 hr Urine 2 - 24 mcg/24 h 4   Norepinephrine, 24 hr Ur 15 - 100 mcg/24 h 39   Calculated Total (E+NE) 26 - 121 mcg/24 h 43   Dopamine, 24 hr Urine 52 - 480 mcg/24 h 139   Creatinine, Urine mg/day-CATEUR 0.63 - 2.50 g/24 h 1.34   Resulting Agency  [facility]   Component Ref Range & Units 10 yr ago  Metanephrines, Ur 90 - 315 mcg/24 h 104   Comment: This specimen was submitted with a pH greater than 6.0. Optimum pH for this assay is 1.0-6.0. Improper preservation may compromise the validity of the assay.  Normetanephrine, 24H Ur 122 - 676 mcg/24 h 318   Metaneph Total, Ur 224 - 832 mcg/24 h 422   Comment: A four fold elevation of urinary normetanephrines is extremely likely to be due to a tumor, while a four fold elevation of urinary metanephrines is highly suggestive, but not diagnostic of the tumor. Measurement of plasma Metanephrines and Chromogranin A is recommended for confirmation.  Resulting Agency  [facility]   12/22/2009: Cortisol,F,ug/L,U Undefined ug/L 20   Cortisol, 24H Ur 0-50 ug/24 hr 50    She also has a history of HTN, OSA. She had COVID-19 10/2019. She recovered well afterwards.  She also has a history of partial thyroidectomy. Reviewed latest TSH: Lab Results  Component Value Date   TSH 1.700 05/19/2023   She had back surgery (12/2020).  She developed significant leg pain afterwards which slowly improved.  ROS: + See HPI  I reviewed pt's medications, allergies, PMH, social hx, family hx, and changes were documented in the history of present illness. Otherwise, unchanged from my initial visit note.  Past Medical History:  Diagnosis Date   Acute blood loss anemia  02/16/2012   Allergy    Arthritis    [employer]reesia 10/14/2020   Blood transfusion without reported diagnosis    [employer] 10/14/2020   Breast cancer (HCC) 2007   History of  XRT, onTamoxifen   Bruises easily    Cancer (HCC)    [employer] 10/14/2020   Depression    Depression    [employer] 10/14/2020   Diabetes mellitus    prediabetic   Diabetes mellitus without complication (HCC)    [employer] 10/14/2020   GERD (gastroesophageal reflux disease)    Hypertriglyceridemia    Hypothyroidism    following chemo amnd radiaition for breast cancer, needed replacement short term   Hypothyroidism (acquired)    replaced x 1 year   IBS (irritable bowel syndrome)    Menieres disease    Controlled with triamterene     Migraines    Obesity  Palpitations    Personal history of radiation therapy    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HCC)    Sleep apnea 2010   Problems with CPAP   Past Surgical History:  Procedure Laterality Date   ABDOMINAL EXPOSURE N/A 12/03/2020   Procedure: ABDOMINAL EXPOSURE;  Surgeon: Mayo Speck, MD;  Location: [facility];  Service: Vascular;  Laterality: N/A;   ABDOMINAL HYSTERECTOMY  1998   Benign, fibroids   ADRENALECTOMY  02/16/2012   [facility], splemic trauma, 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   ANTERIOR LUMBAR FUSION N/A 12/03/2020   Procedure: Lumbar Four-Five Lumbar Five Sacral One Anterior lumbar interbody fusion;  Surgeon: Audie Bleacher, MD;  Location: [facility] OR;  Service: Neurosurgery;  Laterality: N/A;   APPENDECTOMY  06/12/2008   BIOPSY  07/02/2020   Procedure: BIOPSY;  Surgeon: Urban Garden, MD;  Location: [facility];  Service: Gastroenterology;;   BIOPSY  01/22/2021   Procedure: BIOPSY;  Surgeon: Suzette Espy, MD;  [facility]: [facility];  Service: Endoscopy;;   BIOPSY  02/17/2022   Procedure: BIOPSY;  Surgeon: Ruby Corporal, MD;  [facility]: [facility];  Service: Endoscopy;;   BREAST BIOPSY Left 02/21/2024   US  LT BREAST BX W LOC DEV 1ST LESION IMG BX SPEC US   GUIDE 02/21/2024 [facility]-UL[facility]   BREAST LUMPECTOMY Left 2008   BREAST RECONSTRUCTION     Left reconstructive   BREAST SURGERY     Mammosite - right side   CHOLECYSTECTOMY  1985   COLONOSCOPY WITH PROPOFOL  N/A 07/02/2020   Procedure: COLONOSCOPY WITH PROPOFOL ;  Surgeon: Urban Garden, MD;  [facility];  Service: Gastroenterology;  Laterality: N/A;  1045   COLONOSCOPY WITH PROPOFOL  N/A 04/01/2021   Procedure: COLONOSCOPY WITH PROPOFOL ;  Surgeon: Urban Garden, MD;  [facility];  Service: Gastroenterology;  Laterality: N/A;  12:50   ESOPHAGEAL DILATION  12/19/2015   Procedure: ESOPHAGEAL DILATION;  Surgeon: Ruby Corporal, MD;  [facility];  Service: Endoscopy;;   ESOPHAGOGASTRODUODENOSCOPY N/A 12/19/2015   Procedure: ESOPHAGOGASTRODUODENOSCOPY (EGD);  Surgeon: Ruby Corporal, MD;  [facility];  Service: Endoscopy;  Laterality: N/A;  11:40   ESOPHAGOGASTRODUODENOSCOPY N/A 02/28/2024   Procedure: EGD (ESOPHAGOGASTRODUODENOSCOPY);  Surgeon: Hargis Lias, MD;  [facility];  Service: Endoscopy;  Laterality: N/A;  11:30AM;ASA 2   ESOPHAGOGASTRODUODENOSCOPY (EGD) WITH PROPOFOL  N/A 02/17/2022   Procedure: ESOPHAGOGASTRODUODENOSCOPY (EGD) WITH PROPOFOL ;  Surgeon: Ruby Corporal, MD;  [facility];  Service: Endoscopy;  Laterality: N/A;  1045   FLEXIBLE SIGMOIDOSCOPY N/A 01/22/2021   Procedure: FLEXIBLE SIGMOIDOSCOPY;  Surgeon: Suzette Espy, MD;  [facility];  Service: Endoscopy;  Laterality: N/A;   HEMOSTASIS CLIP PLACEMENT  04/01/2021   Procedure: HEMOSTASIS CLIP PLACEMENT;  Surgeon: Urban Garden, MD;  [facility];  Service: Gastroenterology;;  x2   POLYPECTOMY  07/02/2020   Procedure: POLYPECTOMY;  Surgeon: Urban Garden, MD;  [facility];  Service: Gastroenterology;;   POLYPECTOMY  04/01/2021   Procedure: POLYPECTOMY INTESTINAL;   Surgeon: Urban Garden, MD;  [facility];  Service: Gastroenterology;;   SPINE SURGERY     SPLENECTOMY, 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   THYROIDECTOMY, PARTIAL  11/05/2010   Benign disease   TUBAL LIGATION     Social History   Socioeconomic History   Marital status: Married    Spouse name: Brenita Callow   Number of children:  2   Years of education: Not on file   Highest education level: Some college, no degree  Occupational History   Not on file  Tobacco Use   Smoking status: Every Day    Current packs/day: 0.50    Average packs/day: 0.5 packs/day for 40.0 years (20.0 ttl pk-yrs)    Types: Cigarettes   Smokeless tobacco: Never   Tobacco comments:    1/2 pack a day    02/15/2023 Patient smokes about 1/2 pack daily  Vaping Use   Vaping status: Never Used  Substance and Sexual Activity   Alcohol use: No    Comment: Encouraged to quit smoking. She has tried the nicotone gum and patches but did not help   Drug use: No   Sexual activity: Not Currently    Birth control/protection: Surgical  Other Topics Concern   Not on file  Social History Narrative   Not on file   Social Drivers of Health   Financial Resource Strain: Medium Risk (01/09/2024)   Overall Financial Resource Strain (CARDIA)    Difficulty of Paying Living Expenses: Somewhat hard  Food Insecurity: Food Insecurity Present (01/09/2024)   Hunger Vital Sign    Worried About Running Out of Food in the Last Year: Sometimes true    Ran Out of Food in the Last Year: Sometimes true  Transportation Needs: No Transportation Needs (01/09/2024)   PRAPARE - Administrator, Civil Service (Medical): No    Lack of Transportation (Non-Medical): No  Physical Activity: Inactive (01/10/2024)   Exercise Vital Sign    Days of Exercise per Week: 0 days    Minutes of Exercise per Session: 0 min  Stress: No Stress Concern Present (01/09/2024)   [employer] of  Occupational Health - Occupational Stress Questionnaire    Feeling of Stress : Only a little  Social Connections: Socially Integrated (01/09/2024)   Social Connection and Isolation Panel [NHANES]    Frequency of Communication with Friends and Family: More than three times a week    Frequency of Social Gatherings with Friends and Family: More than three times a week    Attends Religious Services: More than 4 times per year    Active Member of [employer] or Organizations: Yes    Attends Banker Meetings: Never    Marital Status: Married  Catering manager Violence: Not At Risk (01/10/2024)   Humiliation, Afraid, Rape, and Kick questionnaire    Fear of Current or Ex-Partner: No    Emotionally Abused: No    Physically Abused: No    Sexually Abused: No   Current Outpatient Medications on File Prior to Visit  Medication Sig Dispense Refill   albuterol  (VENTOLIN  HFA) 108 (90 Base) MCG/ACT inhaler TAKE 2 PUFFS BY MOUTH EVERY 6 HOURS AS NEEDED FOR WHEEZE OR SHORTNESS OF BREATH     butalbital -acetaminophen -caffeine  (FIORICET) 50-325-40 MG tablet Take one tablet by mouth every 8  to 12 hours , as needed, for uncontrolled headache 20 tablet 3   Continuous Glucose Sensor (FREESTYLE LIBRE 3 PLUS SENSOR) MISC Inject 1 Device into the skin continuous. Change every 15 days 6 each 3   fluticasone  (FLONASE ) 50 MCG/ACT nasal spray SPRAY 2 SPRAYS INTO EACH NOSTRIL EVERY DAY (Patient taking differently: Pt not taking) 48 mL 2   Insulin  Pen Needle (BD PEN NEEDLE NANO 2ND GEN) 32G X 4 MM MISC USE 4X DAILY AS DIRECTED 400 each 1   Lancets (ONETOUCH DELICA PLUS LANCET33G) MISC USE TO TEST 4  TIMES A DAY 100 each 7   LORazepam  (ATIVAN ) 1 MG tablet Take one tablet by mouth once daily, as needed, for uncontrolled nausea 30 tablet 0   nicotine  (NICODERM CQ ) 7 mg/24hr patch Place 1 patch (7 mg total) onto the skin daily. (Patient not taking: Reported on 02/09/2024) 56 patch 0   omeprazole  (PRILOSEC) 40 MG capsule TAKE 1  CAPSULE DAILY 90 capsule 1   ONETOUCH VERIO test strip USE AS DIRECTED 2 TIMES DAILY 200 strip 3   promethazine  (PHENERGAN ) 12.5 MG tablet Take 1 tablet (12.5 mg total) by mouth every 8 (eight) hours as needed for nausea or vomiting. 30 tablet 0   rosuvastatin  (Annette [name] ) 20 MG tablet TAKE 1 TABLET DAILY 90 tablet 1   temazepam  (RESTORIL ) 15 MG capsule TAKE 1 CAPSULE AT BEDTIME  AS NEEDED FOR SLEEP 90 capsule 0   temazepam  (RESTORIL ) 15 MG capsule Take 1 capsule (15 mg total) by mouth at bedtime as needed for sleep. 90 capsule 0   traZODone  (DESYREL ) 100 MG tablet TAKE 1 TABLET BY MOUTH EVERYDAY AT BEDTIME 90 tablet 1   TRESIBA  FLEXTOUCH 200 UNIT/ML FlexTouch Pen INJECT 26 UNITS            SUBCUTANEOUSLY AT BEDTIME 18 mL 1   triamterene -hydrochlorothiazide  (DYAZIDE ) 37.5-25 MG capsule TAKE 1 CAPSULE EVERY       MORNING 90 capsule 1   UNABLE TO FIND CPAP tubing and supplies, face mask Dx: G47.30 1 each 0   venlafaxine  XR (EFFEXOR -XR) 75 MG 24 hr capsule TAKE 1 CAPSULE BY MOUTH EVERY DAY 90 capsule 1   No current facility-administered medications on file prior to visit.   Allergies  Allergen Reactions   Penicillins Swelling    Has patient had a PCN reaction causing immediate rash, facial/tongue/throat swelling, SOB or lightheadedness with hypotension: Yes Has patient had a PCN reaction causing severe rash involving mucus membranes or skin necrosis: No Has patient had a PCN reaction that required hospitalization: No  Has patient had a PCN reaction occurring within the last 10 years: No If all of the above answers are "NO", then may proceed with Cephalosporin use.   Of face and tongue.   Metformin  And Related Diarrhea and Nausea Only    Pt to discontinue med due to intolerance, cramps and diarrhea   Wound Dressing Adhesive Itching and Rash    Dermabond and other surgical glues for incisions   Jardiance [Empagliflozin] Other (See Comments)    Yeast infection     Statins Other (See Comments)     Fell bad but on Annette [name]    Annette [name]  [Ezetimibe ] Other (See Comments)    Feels funny   Family History  Problem Relation Age of Onset   Diabetes Mother    Arrhythmia Mother    Varicose Veins Mother    Heart disease Father    Hyperlipidemia Father    Arrhythmia Father    Depression Father    Diabetes Sister    Breast cancer Paternal Grandmother    Cancer Paternal Grandmother        Breast   Diabetes Brother    Heart disease Brother    PE: BP 120/68   Pulse 67   Ht 5\' 6"  (1.676 m)   Wt 189 lb 3.2 oz (85.8 kg)   SpO2 95%   BMI 30.54 kg/m  Wt Readings from Last 3 Encounters:  03/13/24 189 lb 3.2 oz (85.8 kg)  02/28/24 188 lb (85.3 kg)  02/09/24 185 lb 14.4 oz (  84.3 kg)   Constitutional: overweight, in NAD Eyes: EOMI, no exophthalmos ENT: no thyromegaly, no cervical lymphadenopathy Cardiovascular: RRR, No MRG Respiratory: CTA B Musculoskeletal: no deformities Skin:  no rashes Neurological: no tremor with outstretched hands Diabetic Foot Exam - Simple   Simple Foot Form Diabetic Foot exam was performed with the following findings: Yes 03/13/2024  4:16 PM  Visual Inspection No deformities, no ulcerations, no other skin breakdown bilaterally: Yes Sensation Testing Intact to touch and monofilament testing bilaterally: Yes Pulse Check Posterior Tibialis and Dorsalis pulse intact bilaterally: Yes Comments    ASSESSMENT: 1. DM2, insulin -dependent, uncontrolled, without long-term complications, but with hyperglycemia  No family history of medullary thyroid  cancer or personal history of pancreatitis.  2. HL  3.  Adrenal adenoma  PLAN:  1. Patient with longstanding, previously uncontrolled type 2 diabetes, on injectable antidiabetic regimen with long-acting insulin  and previously on weekly GLP-1 receptor agonist, which she had to stop since last visit due to nausea and vomiting.  At last visit, HbA1c decreased from 6.5% to 6.1%.  At that time, I advised her to reduce the  Tresiba  dose as sugars appear to be well-controlled and even dropping occasionally under 70s after 3 AM.  I did advise her to try to stop the caramel macchiato coffee in the afternoon as this was increasing her blood sugars. CGM interpretation: -At today's visit, we reviewed her CGM downloads: It appears that 88% of values are in target range (goal >70%), while 11% are higher than 180 (goal <25%), and 1% are lower than 70 (goal <4%).  The calculated average blood sugar is 128.  The projected HbA1c for the next 3 months (GMI) is 6.4%. -Reviewing the CGM trends, sugars appear to increase slightly after breakfast but then more so after dinner, with a peak around 12 AM.  Sugars drop afterwards with a nadir around 8 to 9 AM.  At today's visit, based on this pattern, I recommended to decrease the Tresiba  dose even more from 20 units to 15 units daily.  I again advised her to stop her coffee at night.  Today's visit, she is off Trulicity  and increased Tresiba  to 24 units after this change.  As a consequence, she has hyperglycemic peaks interspersed with blood sugars in the low normal range or even lower than normal.  In the office today, lowest blood sugar was 58 and we gave her glucose tablets.  I strongly advised her to decrease the Tresiba  dose further but we do need to cover her hyperglycemic excursions in a different way.  Unfortunately, she is not able to stop her coffee with creamer including caramel macchiato and she has multiple of these throughout the day.  Now, I suggested that DPP 4 inhibitor and if she is not able to obtain or tolerate this, we may need a low-dose glipizide .  I advised her to let me know. - I suggested to:  Patient Instructions  Please decrease: - Tresiba  18 units daily  Please start: - Januvia 100 mg before b'fast  Try to stop the caramel macchiato.  Please return in 4 months.  - we checked her HbA1c: 6.2% (only slightly higher). - advised to check sugars at different times  of the day - 4x a day, rotating check times - advised for yearly eye exams >> she is UTD - return to clinic in 4 months  2. HL - Latest lipid panel was reviewed from 01/2024: Fractions at goal: Lab Results  Component Value Date   CHOL  150 01/03/2024   HDL 51 01/03/2024   LDLCALC 81 01/03/2024   TRIG 98 01/03/2024   CHOLHDL 2.4 05/19/2023  -She continues Annette [name]  20 mg daily without side effects  3.  Adrenal adenoma - Nonfunctional -adrenal hormone levels were repeatedly normal -Reviewed previous adrenal pathology from her surgery with Dr. Leslye [name]: Benign adenomas.  This is likely a bilateral process for her. -Adrenal hormone testing from 2011 on was reviewed and this was normal -Latest investigation was in 2023 when we checked aldosterone, cortisol, plasma metanephrines and catecholamines and they were all normal.  A dexamethasone  suppression test was also normal.  - Will continue to follow her without intervention for now.  Emilie Harden, MD PhD [facility]

## 2024-03-13 NOTE — Patient Instructions (Addendum)
 Please decrease: - Tresiba  18 units daily  Please start: - Januvia 100 mg before b'fast  Try to stop the caramel macchiato.  Please return in 4 months.

## 2024-03-16 ENCOUNTER — Encounter: Payer: Self-pay | Admitting: Internal Medicine

## 2024-03-16 ENCOUNTER — Other Ambulatory Visit: Payer: Self-pay | Admitting: Internal Medicine

## 2024-03-16 MED ORDER — GLIPIZIDE 5 MG PO TABS: 5.0000 mg | ORAL_TABLET | Freq: Every day | ORAL | 1 refills | Status: DC

## 2024-03-16 NOTE — Telephone Encounter (Signed)
 Pt has been notified and voices understanding.

## 2024-03-19 MED ORDER — FREESTYLE LIBRE 3 READER DEVI: 1 refills | Status: DC

## 2024-03-19 NOTE — Addendum Note (Signed)
 Addended by: Vernon Goodpasture on: 03/19/2024 03:25 PM   Modules accepted: Orders

## 2024-03-20 ENCOUNTER — Telehealth: Payer: Self-pay | Admitting: Family Medicine

## 2024-03-20 NOTE — Telephone Encounter (Unsigned)
 Copied from CRM [id number]. Topic: Clinical - Medication Refill >> Mar 20, 2024 11:27 AM Ivette P wrote: Medication: temazepam  (RESTORIL ) 15 MG capsule  Has the patient contacted their pharmacy? Yes (Agent: If no, request that the patient contact the pharmacy for the refill. If patient does not wish to contact the pharmacy document the reason why and proceed with request.) (Agent: If yes, when and what did the pharmacy advise?)  This is the patient's preferred pharmacy:  [facility] [place], [place] - [facility] AT Portal to Registered Caremark Sites One [place] [address] Phone: [phone] Fax: [phone]   Is this the correct pharmacy for this prescription? Yes If no, delete pharmacy and type the correct one.   Has the prescription been filled recently? No, 11/28/2023  Is the patient out of the medication? Yes  Has the patient been seen for an appointment in the last year OR does the patient have an upcoming appointment? Yes  Can we respond through MyChart? No  Agent: Please be advised that Rx refills may take up to 3 business days. We ask that you follow-up with your pharmacy.

## 2024-03-20 NOTE — Telephone Encounter (Unsigned)
 Copied from CRM [id number]. Topic: Clinical - Medication Refill >> Mar 20, 2024 11:34 AM Crispin Dolphin wrote: Medication:  traZODone  (DESYREL ) 100 MG tablet   Has the patient contacted their pharmacy? No Pharmacy Requested Ref # [id number] (Agent: If no, request that the patient contact the pharmacy for the refill. If patient does not wish to contact the pharmacy document the reason why and proceed with request.) (Agent: If yes, when and what did the pharmacy advise?)  This is the patient's preferred pharmacy:  [facility] - [place], [place] - One [facility] AT Portal to Registered Caremark Sites One [place] [address] Phone: [phone] Fax: [phone]  Is this the correct pharmacy for this prescription? Yes If no, delete pharmacy and type the correct one.   Has the prescription been filled recently? N/A  Is the patient out of the medication? N/A  Has the patient been seen for an appointment in the last year OR does the patient have an upcoming appointment? Yes  Can we respond through MyChart? No  Agent: Please be advised that Rx refills may take up to 3 business days. We ask that you follow-up with your pharmacy.

## 2024-04-17 ENCOUNTER — Other Ambulatory Visit: Payer: Self-pay | Admitting: Family Medicine

## 2024-04-17 DIAGNOSIS — F5104 Psychophysiologic insomnia: Secondary | ICD-10-CM

## 2024-04-19 ENCOUNTER — Other Ambulatory Visit: Payer: Self-pay | Admitting: Family Medicine

## 2024-04-19 NOTE — Telephone Encounter (Unsigned)
 Copied from CRM [id number]. Topic: Clinical - Medication Refill >> Apr 19, 2024  1:20 PM Berwyn MATSU wrote: Medication: temazepam  (RESTORIL ) 15 MG capsule  Has the patient contacted their pharmacy? Yes (Agent: If no, request that the patient contact the pharmacy for the refill. If patient does not wish to contact the pharmacy document the reason why and proceed with request.) (Agent: If yes, when and what did the pharmacy advise?)  This is the patient's preferred pharmacy:  [facility] - [place], [place] - One [facility] AT Portal to Registered Caremark Sites One [place] [address] Phone: [phone] Fax: [phone]    Is this the correct pharmacy for this prescription? Yes If no, delete pharmacy and type the correct one.   Has the prescription been filled recently? Yes  Is the patient out of the medication? Yes for over a month called about medication on 03/20/24  Has the patient been seen for an appointment in the last year OR does the patient have an upcoming appointment? Yes  Can we respond through MyChart? Yes  Agent: Please be advised that Rx refills may take up to 3 business days. We ask that you follow-up with your pharmacy.

## 2024-05-03 ENCOUNTER — Other Ambulatory Visit: Payer: Self-pay | Admitting: Family Medicine

## 2024-05-03 MED ORDER — TRAZODONE HCL 100 MG PO TABS
ORAL_TABLET | ORAL | 1 refills | Status: DC
Start: 2024-05-03 — End: 2024-06-13

## 2024-05-03 NOTE — Telephone Encounter (Unsigned)
 Copied from CRM [id number]. Topic: Clinical - Medication Refill >> Apr 19, 2024  1:20 PM Berwyn MATSU wrote: Medication: temazepam  (RESTORIL ) 15 MG capsule  Has the patient contacted their pharmacy? Yes (Agent: If no, request that the patient contact the pharmacy for the refill. If patient does not wish to contact the pharmacy document the reason why and proceed with request.) (Agent: If yes, when and what did the pharmacy advise?)  This is the patient's preferred pharmacy:  [facility] - [place], [place] - One [facility] AT Portal to Registered Caremark Sites One [place] [address] Phone: [phone] Fax: [phone]    Is this the correct pharmacy for this prescription? Yes If no, delete pharmacy and type the correct one.   Has the prescription been filled recently? Yes  Is the patient out of the medication? Yes for over a month called about medication on 03/20/24  Has the patient been seen for an appointment in the last year OR does the patient have an upcoming appointment? Yes  Can we respond through MyChart? Yes  Agent: Please be advised that Rx refills may take up to 3 business days. We ask that you follow-up with your pharmacy.

## 2024-05-03 NOTE — Telephone Encounter (Signed)
 Copied from CRM [id number]. Topic: Clinical - Medication Refill >> May 03, 2024  2:35 PM Montie POUR wrote: Medication: traZODone  (DESYREL ) 100 MG tablet  Has the patient contacted their pharmacy? Yes (Agent: If no, request that the patient contact the pharmacy for the refill. If patient does not wish to contact the pharmacy document the reason why and proceed with request.) (Agent: If yes, when and what did the pharmacy advise?) Pharmacy needs order to refill  This is the patient's preferred pharmacy:  [facility] - [place], [place] - One [facility] AT Portal to Registered Caremark Sites One [place] [address] Phone: [phone] Fax: [phone]  Is this the correct pharmacy for this prescription? Yes If no, delete pharmacy and type the correct one.   Has the prescription been filled recently? No  Is the patient out of the medication? No  Has the patient been seen for an appointment in the last year OR does the patient have an upcoming appointment? Yes  Can we respond through MyChart? No  Agent: Please be advised that Rx refills may take up to 3 business days. We ask that you follow-up with your pharmacy.

## 2024-05-03 NOTE — Telephone Encounter (Unsigned)
 Copied from CRM [id number]. Topic: Clinical - Medication Refill >> May 03, 2024  4:37 PM [name] ORN wrote: Medication: temazepam  (RESTORIL ) 15 MG capsule  Has the patient contacted their pharmacy? Yes (Agent: If no, request that the patient contact the pharmacy for the refill. If patient does not wish to contact the pharmacy document the reason why and proceed with request.) (Agent: If yes, when and what did the pharmacy advise?)  This is the patient's preferred pharmacy:  [facility] - [place], [place] - One [facility] AT Portal to Registered Caremark Sites One [place] [address] Phone: [phone] Fax: [phone]  Is this the correct pharmacy for this prescription? Yes If no, delete pharmacy and type the correct one.   Has the prescription been filled recently? No  Is the patient out of the medication? Yes  Has the patient been seen for an appointment in the last year OR does the patient have an upcoming appointment? Yes  Can we respond through MyChart? Yes  Agent: Please be advised that Rx refills may take up to 3 business days. We ask that you follow-up with your pharmacy.

## 2024-05-04 ENCOUNTER — Ambulatory Visit (INDEPENDENT_AMBULATORY_CARE_PROVIDER_SITE_OTHER): Admitting: Family Medicine

## 2024-05-04 ENCOUNTER — Encounter: Payer: Self-pay | Admitting: Family Medicine

## 2024-05-04 VITALS — BP 96/65 | HR 87 | Ht 66.0 in | Wt 188.5 lb

## 2024-05-04 DIAGNOSIS — Z0001 Encounter for general adult medical examination with abnormal findings: Secondary | ICD-10-CM

## 2024-05-04 DIAGNOSIS — E559 Vitamin D deficiency, unspecified: Secondary | ICD-10-CM

## 2024-05-04 DIAGNOSIS — E890 Postprocedural hypothyroidism: Secondary | ICD-10-CM

## 2024-05-04 DIAGNOSIS — E785 Hyperlipidemia, unspecified: Secondary | ICD-10-CM

## 2024-05-04 MED ORDER — TEMAZEPAM 15 MG PO CAPS: 15.0000 mg | ORAL_CAPSULE | Freq: Every day | ORAL | 1 refills | Status: DC

## 2024-05-04 MED ORDER — LORAZEPAM 1 MG PO TABS: ORAL_TABLET | ORAL | 1 refills | Status: DC

## 2024-05-04 NOTE — Progress Notes (Signed)
    Annette [name]     MRN: [id number]      DOB: [birth year]  Chief Complaint  Patient presents with   Annual Exam    HPI: Patient is in for annual physical exam. Wants to quit smoking and needs to before she can have abdonial hernia repaired Recent labs,  are reviewed. Immunization is reviewed , and  updated if needed.   PE: BP 96/65   Pulse 87   Ht 5' 6 (1.676 m)   Wt 188 lb 8 oz (85.5 kg)   SpO2 95%   BMI 30.42 kg/m   Pleasant  female, alert and oriented x 3, in no cardio-pulmonary distress. Afebrile. HEENT No facial trauma or asymetry. Sinuses non tender.  Extra occullar muscles intact.. External ears normal, . Neck: supple, no adenopathy,JVD or thyromegaly.No bruits.  Chest: Clear to ascultation bilaterally.No crackles or wheezes. Non tender to palpation  Breast: Not examined , asymptomatic and mammogram is UTD  Cardiovascular system; Heart sounds normal,  S1 and  S2 ,no S3.  No murmur, or thrill. Apical beat not displaced Peripheral pulses normal.  Abdomen: Soft, non tender, no organomegaly or masses. Abdominal wall hernia  No bruits. Bowel sounds normal. No guarding, tenderness or rebound.   GU: Asymptomatic , no exam performed. Pt to have pelvic and pap at next vist as she is concerned not done today   Musculoskeletal exam: Adequate ROM of spine, hips , shoulders and knees. No deformity ,swelling or crepitus noted. No muscle wasting or atrophy.   Neurologic: Cranial nerves 2 to 12 intact. Power, tone ,sensation  normal throughout. No disturbance in gait. No tremor.  Skin: Intact, no ulceration, erythema , scaling or rash noted. Pigmentation normal throughout  Psych; Normal mood and affect. Judgement and concentration normal   Assessment & Plan:  Encounter for Medicare annual examination with abnormal findings Annual exam as documented.  Immunization and cancer screening needs are specifically addressed at this visit.

## 2024-05-04 NOTE — Patient Instructions (Addendum)
 F/u in 5 month with pap  Lipid panel, cmp and EGFr, Vitamin  D, TSH today  Call [phone], may get help with  patches  Thanks for choosing [facility], we consider it a privelige to serve you.

## 2024-05-05 ENCOUNTER — Ambulatory Visit: Payer: Self-pay | Admitting: Family Medicine

## 2024-05-05 LAB — CMP14+EGFR
ALT: 24 IU/L (ref 0–32)
AST: 29 IU/L (ref 0–40)
Albumin: 4.3 g/dL (ref 3.9–4.9)
Alkaline Phosphatase: 90 IU/L (ref 44–121)
BUN/Creatinine Ratio: 12 (ref 12–28)
BUN: 14 mg/dL (ref 8–27)
Bilirubin Total: 0.4 mg/dL (ref 0.0–1.2)
CO2: 22 mmol/L (ref 20–29)
Calcium: 10 mg/dL (ref 8.7–10.3)
Chloride: 104 mmol/L (ref 96–106)
Creatinine, Ser: 1.15 mg/dL — ABNORMAL HIGH (ref 0.57–1.00)
Globulin, Total: 2.9 g/dL (ref 1.5–4.5)
Glucose: 97 mg/dL (ref 70–99)
Potassium: 4.2 mmol/L (ref 3.5–5.2)
Sodium: 141 mmol/L (ref 134–144)
Total Protein: 7.2 g/dL (ref 6.0–8.5)
eGFR: 53 mL/min/1.73 — ABNORMAL LOW (ref >59)

## 2024-05-05 LAB — LIPID PANEL
Chol/HDL Ratio: 2.7 ratio (ref 0.0–4.4)
Cholesterol, Total: 131 mg/dL (ref 100–199)
HDL: 49 mg/dL (ref >39)
LDL Chol Calc (NIH): 68 mg/dL (ref 0–99)
Triglycerides: 70 mg/dL (ref 0–149)
VLDL Cholesterol Cal: 14 mg/dL (ref 5–40)

## 2024-05-05 LAB — VITAMIN D 25 HYDROXY (VIT D DEFICIENCY, FRACTURES): Vit D, 25-Hydroxy: 40.2 ng/mL (ref 30.0–100.0)

## 2024-05-05 LAB — TSH: TSH: 2.6 u[IU]/mL (ref 0.450–4.500)

## 2024-05-06 NOTE — Assessment & Plan Note (Signed)
 Annual exam as documented. . Immunization and cancer screening needs are specifically addressed at this visit.

## 2024-05-08 ENCOUNTER — Ambulatory Visit (INDEPENDENT_AMBULATORY_CARE_PROVIDER_SITE_OTHER): Admitting: Gastroenterology

## 2024-05-08 ENCOUNTER — Telehealth (INDEPENDENT_AMBULATORY_CARE_PROVIDER_SITE_OTHER): Payer: Self-pay | Admitting: Gastroenterology

## 2024-05-08 ENCOUNTER — Encounter: Payer: Self-pay | Admitting: *Deleted

## 2024-05-08 ENCOUNTER — Encounter (INDEPENDENT_AMBULATORY_CARE_PROVIDER_SITE_OTHER): Payer: Self-pay | Admitting: Gastroenterology

## 2024-05-08 VITALS — BP 100/67 | HR 80 | Temp 97.1°F | Ht 66.0 in | Wt 188.6 lb

## 2024-05-08 DIAGNOSIS — K219 Gastro-esophageal reflux disease without esophagitis: Secondary | ICD-10-CM

## 2024-05-08 DIAGNOSIS — R1013 Epigastric pain: Secondary | ICD-10-CM | POA: Diagnosis not present

## 2024-05-08 DIAGNOSIS — R112 Nausea with vomiting, unspecified: Secondary | ICD-10-CM | POA: Diagnosis not present

## 2024-05-08 MED ORDER — PROMETHAZINE HCL 12.5 MG PO TABS: 12.5000 mg | ORAL_TABLET | Freq: Three times a day (TID) | ORAL | 1 refills | Status: DC | PRN

## 2024-05-08 NOTE — Progress Notes (Addendum)
 Referring Provider: Antonetta Rollene BRAVO, MD Primary Care Physician:  Antonetta Rollene BRAVO, MD Primary GI Physician: Dr. Eartha    Chief Complaint  Patient presents with   Follow-up    Patient here today for a follow up on nausea and vomiting. Patient says this is somewhat better as she is not having the symptoms quite as often. Needs refill on promethazine .    HPI:   Annette [name] is a 63 y.o. female with past medical history of  depression, diabetes, GERD, HLD, Hypothyroidism, IBS-D, OSA, breast cancer in remission   Patient presenting today for:  Follow up of nausea, vomiting, epigastric pain, GERD  Last seen in may, at that time, had n/v about 1 year after starting ozempic. Ozempic stopped in April though still having symptoms. Some weight loss. Endorsed LUQ pain. Gerd well controlled on omeprazole . Taking excedrin and naproxyn frequently.   Recommended EGD, avoid NSAIDs, PPI daily, phenergan , referral to [facility] regarding hernia repair, consdier GES if EGD unremarkable  EGD done 02/28/24 by Dr. Cinderella: gastritis, duodenal mucosal lympbhangiectasia  A. SMALL BOWEL, BIOPSY:  Duodenal mucosa with normal villous architecture.  No villous atrophy or increased intraepithelial lymphocytes.   B. STOMACH, BIOPSY:  Gastric antral mucosa with hyperemia and slight chronic inflammation.  Negative for Helicobacter pylori.   C. Z LINE, BIOPSY:  Gastroesophageal mucosa with mild inflammation consistent with reflux.  Negative for dysplasia and intestinal metaplasia.   Recommended to continue omeprazole  40mg  daily, consider GES, CT A/P with contrast if persisting symptoms   Present:  Patient states doing some better than previously. She is taking omeprazole  40mg  daily, rarely will have breakthrough symptoms and will take an extra PPI pill but this does not occur often. She is having some discomfort still in epigastric area. Having nausea about 2-3 days per week, takes phenergan  PRN.  Appetite is about the same, she is trying to watch what she eats. She is still taking excedrin for migraines maybe a couple of times per week, uses aleve  only as needed. Does note some sharp pain in the right side of her neck when she turns her head, going on for the past few weeks.   She did see surgeon regarding ventral hernia repair, they recommended she stop smoking and return for follow up to discuss surgical repair thereafter. She is going to get some nicotine  patches after her visit today.   denies melena, hematochezia, diarrhea, constipation, dysphagia, odyonophagia, early satiety or weight loss.    Last Colonoscopy:04/01/21- One 9 mm polyp in the cecum, (sessile serrated adenoma)Clips were placed- Two 3 to 5 mm polyps in the ascending colon(tubular adenoma) removed with a cold snare.- The distal rectum and anal verge are normal on retroflexion view.    Filed Weights   05/08/24 1316  Weight: 188 lb 9.6 oz (85.5 kg)     Past Medical History:  Diagnosis Date   Acute blood loss anemia 02/16/2012   Allergy    Arthritis    Phreesia 10/14/2020   Blood transfusion without reported diagnosis    [employer]reesia 10/14/2020   Breast cancer (HCC) 2007   History of  XRT, onTamoxifen   Bruises easily    Cancer (HCC)    Phreesia 10/14/2020   Depression    Depression    Phreesia 10/14/2020   Diabetes mellitus    prediabetic   Diabetes mellitus without complication (HCC)    Phreesia 10/14/2020   GERD (gastroesophageal reflux disease)    Hypertriglyceridemia    Hypothyroidism  following chemo amnd radiaition for breast cancer, needed replacement short term   Hypothyroidism (acquired)    replaced x 1 year   IBS (irritable bowel syndrome)    Menieres disease    Controlled with triamterene     Migraines    Obesity    Palpitations    Personal history of radiation therapy    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HCC)    Sleep apnea 2010   Problems with CPAP    Past Surgical History:  Procedure  Laterality Date   ABDOMINAL EXPOSURE N/A 12/03/2020   Procedure: ABDOMINAL EXPOSURE;  Surgeon: Oris Krystal FALCON, MD;  Location: [facility];  Service: Vascular;  Laterality: N/A;   ABDOMINAL HYSTERECTOMY  1998   Benign, fibroids   ADRENALECTOMY  02/16/2012   [facility], splemic trauma, 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   ANTERIOR LUMBAR FUSION N/A 12/03/2020   Procedure: Lumbar Four-Five Lumbar Five Sacral One Anterior lumbar interbody fusion;  Surgeon: Gillie Duncans, MD;  Location: [facility] OR;  Service: Neurosurgery;  Laterality: N/A;   APPENDECTOMY  06/12/2008   BIOPSY  07/02/2020   Procedure: BIOPSY;  Surgeon: Eartha Angelia Sieving, MD;  [facility]: [facility];  Service: Gastroenterology;;   BIOPSY  01/22/2021   Procedure: BIOPSY;  Surgeon: Shaaron Lamar HERO, MD;  [facility];  Service: Endoscopy;;   BIOPSY  02/17/2022   Procedure: BIOPSY;  Surgeon: Golda Claudis PENNER, MD;  [facility];  Service: Endoscopy;;   BREAST BIOPSY Left 02/21/2024   US  LT BREAST BX W LOC DEV 1ST LESION IMG BX SPEC US  GUIDE 02/21/2024 [facility]   BREAST LUMPECTOMY Left 2008   BREAST RECONSTRUCTION     Left reconstructive   BREAST SURGERY     Mammosite - right side   CHOLECYSTECTOMY  1985   COLONOSCOPY WITH PROPOFOL  N/A 07/02/2020   Procedure: COLONOSCOPY WITH PROPOFOL ;  Surgeon: Eartha Angelia Sieving, MD;  [facility];  Service: Gastroenterology;  Laterality: N/A;  1045   COLONOSCOPY WITH PROPOFOL  N/A 04/01/2021   Procedure: COLONOSCOPY WITH PROPOFOL ;  Surgeon: Eartha Angelia Sieving, MD;  [facility];  Service: Gastroenterology;  Laterality: N/A;  12:50   ESOPHAGEAL DILATION  12/19/2015   Procedure: ESOPHAGEAL DILATION;  Surgeon: Claudis PENNER Golda, MD;  [facility];  Service: Endoscopy;;   ESOPHAGOGASTRODUODENOSCOPY N/A 12/19/2015   Procedure: ESOPHAGOGASTRODUODENOSCOPY (EGD);  Surgeon: Claudis PENNER Golda, MD;  [facility];  Service: Endoscopy;  Laterality:  N/A;  11:40   ESOPHAGOGASTRODUODENOSCOPY N/A 02/28/2024   Procedure: EGD (ESOPHAGOGASTRODUODENOSCOPY);  Surgeon: Cinderella Deatrice FALCON, MD;  [facility];  Service: Endoscopy;  Laterality: N/A;  11:30AM;ASA 2   ESOPHAGOGASTRODUODENOSCOPY (EGD) WITH PROPOFOL  N/A 02/17/2022   Procedure: ESOPHAGOGASTRODUODENOSCOPY (EGD) WITH PROPOFOL ;  Surgeon: Golda Claudis PENNER, MD;  [facility];  Service: Endoscopy;  Laterality: N/A;  1045   FLEXIBLE SIGMOIDOSCOPY N/A 01/22/2021   Procedure: FLEXIBLE SIGMOIDOSCOPY;  Surgeon: Shaaron Lamar HERO, MD;  [facility];  Service: Endoscopy;  Laterality: N/A;   HEMOSTASIS CLIP PLACEMENT  04/01/2021   Procedure: HEMOSTASIS CLIP PLACEMENT;  Surgeon: Eartha Angelia Sieving, MD;  [facility];  Service: Gastroenterology;;  x2   POLYPECTOMY  07/02/2020   Procedure: POLYPECTOMY;  Surgeon: Eartha Angelia Sieving, MD;  [facility];  Service: Gastroenterology;;   POLYPECTOMY  04/01/2021   Procedure: POLYPECTOMY INTESTINAL;  Surgeon: Eartha Angelia Sieving, MD;  [facility];  Service: Gastroenterology;;   SPINE SURGERY     SPLENECTOMY, 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 1 each 1   Continuous Glucose Sensor (FREESTYLE LIBRE 3 PLUS SENSOR) MISC Inject 1 Device into the skin continuous. Change every 15 days 6 each 3   fluticasone  (FLONASE ) 50  MCG/ACT nasal spray SPRAY 2 SPRAYS INTO EACH NOSTRIL EVERY DAY (Patient taking differently: as needed.) 48 mL 2   glipiZIDE  (GLUCOTROL ) 5 MG tablet Take 1 tablet (5 mg total) by mouth daily before supper. 90 tablet 1   insulin  degludec (TRESIBA  FLEXTOUCH) 200 UNIT/ML FlexTouch Pen Inject 18 Units into the skin daily. 18 mL 1   Insulin  Pen Needle (BD PEN NEEDLE NANO 2ND GEN) 32G X 4 MM MISC USE 4X DAILY AS DIRECTED 400 each 1   Lancets (ONETOUCH DELICA PLUS LANCET33G) MISC USE TO TEST 4 TIMES A DAY 100 each 7   LORazepam  (ATIVAN ) 1 MG tablet Take one tablet by mouth once daily, as needed, for uncontrolled nausea 30 tablet 1   omeprazole  (PRILOSEC) 40 MG capsule TAKE 1 CAPSULE DAILY 90 capsule 1   ONETOUCH VERIO test strip USE AS DIRECTED 2 TIMES DAILY 200 strip 3   promethazine  (PHENERGAN ) 12.5 MG tablet Take 1 tablet (12.5 mg total) by mouth every 8 (eight) hours as needed for nausea or vomiting. 30 tablet 0   rosuvastatin  (CRESTOR ) 20 MG tablet TAKE 1 TABLET DAILY 90 tablet 1   temazepam  (RESTORIL ) 15 MG capsule Take 1 capsule (15 mg total) by mouth at bedtime. 90 capsule 1   traZODone  (DESYREL ) 100 MG tablet TAKE 1 TABLET BY MOUTH EVERYDAY AT BEDTIME 90 tablet 1   triamterene -hydrochlorothiazide  (DYAZIDE ) 37.5-25 MG capsule TAKE 1 CAPSULE EVERY       MORNING 90 capsule 1   UNABLE TO FIND CPAP tubing and supplies, face mask Dx: G47.30 1 each 0   venlafaxine  XR (EFFEXOR -XR) 75 MG 24 hr capsule TAKE 1 CAPSULE DAILY 90 capsule 1   No current facility-administered medications for this visit.    Allergies as of 05/08/2024 - Review Complete 05/08/2024  Allergen Reaction Noted   Penicillins Swelling    Metformin  and related Diarrhea and Nausea Only 03/30/2015   Wound dressing adhesive Itching and Rash 09/09/2020   Jardiance [empagliflozin] Other (See Comments) 02/29/2020   Statins Other (See Comments) 07/15/2014   Zetia  [ezetimibe ] Other (See Comments) 11/21/2014    Social History    Socioeconomic History   Marital status: Married    Spouse name: Merilee   Number of children: 2   Years of education: Not on file   Highest education level: GED or equivalent  Occupational History   Not on file  Tobacco Use   Smoking status: Every Day    Current packs/day: 0.50    Average packs/day: 0.5 packs/day for 40.0 years (20.0 ttl pk-yrs)    Types: Cigarettes   Smokeless tobacco: Never   Tobacco comments:    1/2 pack a day  02/15/2023 Patient smokes about 1/2 pack daily  Vaping Use   Vaping status: Never Used  Substance and Sexual Activity   Alcohol use: No    Comment: Encouraged to quit smoking. She has tried the nicotone gum and patches but did not help   Drug use: No   Sexual activity: Not Currently    Birth control/protection: Surgical  Other Topics Concern   Not on file  Social History Narrative   Not on file   Social Drivers of Health   Financial Resource Strain: Medium Risk (05/02/2024)   Overall Financial Resource Strain (CARDIA)    Difficulty of Paying Living Expenses: Somewhat hard  Food Insecurity: Food Insecurity Present (05/02/2024)   Hunger Vital Sign    Worried About Running Out of Food in the Last Year: Sometimes true    Ran Out of Food in the Last Year: Sometimes true  Transportation Needs: No Transportation Needs (05/02/2024)   PRAPARE - Administrator, Civil Service (Medical): No    Lack of Transportation (Non-Medical): No  Physical Activity: Insufficiently Active (05/02/2024)   Exercise Vital Sign    Days of Exercise per Week: 3 days    Minutes of Exercise per Session: 30 min  Stress: Stress Concern Present (05/02/2024)   [employer] of Occupational Health - Occupational Stress Questionnaire    Feeling of Stress: To some extent  Social Connections: Socially Integrated (05/02/2024)   Social Connection and Isolation Panel    Frequency of Communication with Friends and Family: More than three times a week    Frequency of  Social Gatherings with Friends and Family: More than three times a week    Attends Religious Services: 1 to 4 times per year    Active Member of [employer] or Organizations: Yes    Attends Banker Meetings: 1 to 4 times per year    Marital Status: Married   Review of systems General: negative for malaise, night sweats, fever, chills, weight loss Neck: Negative for lumps, goiter, pain and significant neck swelling Resp: Negative for cough, wheezing, dyspnea at rest CV: Negative for chest pain, leg swelling, palpitations, orthopnea GI: denies melena, hematochezia,vomiting, diarrhea, constipation, dysphagia, odyonophagia, early satiety or unintentional weight loss. +nausea +epigastric pain  MSK: Negative for joint pain or swelling, back pain, and muscle pain. Derm: Negative for itching or rash Psych: Denies depression, anxiety, memory loss, confusion. No homicidal or suicidal ideation.  Heme: Negative for prolonged bleeding, bruising easily, and swollen nodes. Endocrine: Negative for cold or heat intolerance, polyuria, polydipsia and goiter. Neuro: negative for tremor, gait imbalance, syncope and seizures. The remainder of the review of systems is noncontributory.  Physical Exam: BP 100/67 (BP Location: Right Arm, Patient Position: Sitting, Cuff Size: Normal)   Pulse 80   Temp (!) 97.1 F (36.2 C) (Temporal)   Ht 5' 6 (1.676 m)   Wt 188 lb 9.6 oz (85.5 kg)   BMI 30.44 kg/m  General:   Alert and oriented. No distress noted. Pleasant and cooperative.  Head:  Normocephalic and atraumatic. Eyes:  Conjuctiva clear without scleral icterus. Mouth:  Oral mucosa pink and moist. Good dentition. No lesions. Heart: Normal rate and rhythm, s1 and s2 heart sounds present.  Lungs: Clear lung sounds in all lobes. Respirations equal and unlabored. Abdomen:  +BS, soft, and non-distended. TTP of epigastric region. No rebound or guarding. No HSM or masses noted. Derm: No palmar erythema or  jaundice Msk:  Symmetrical without gross deformities. Normal posture. Extremities:  Without edema. Neurologic:  Alert and  oriented x4 Psych:  Alert and cooperative. Normal mood and affect.  Invalid input(s): 6 MONTHS   ASSESSMENT: Annette [name] is a 63 y.o. female presenting today for follow up of Nausea, vomiting and epigastric pain   Patient with long history of nausea, upper abdominal pain, improved some off ozempic. She underwent EGD as outlined above with some changes of reflux. Feeling some better, though continues to have some nausea a few days per week, as well as epigastric pain. GERD seems well controlled on omeprazole  40mg  daily. She has history of diabetes, though last A1c was 6.2. would recommend proceeding with GES as next steps to rule out gastroparesis. For now will continue with PPI daily, anti emetics as needed, should continue to limit NSAID use. May consider CT A/P with contrast if symptoms persist and GES is normal.   Patient did inquire about TIF procedure which we discussed briefly. I have provided her with a brochure for this, she will reach out with any further questions or if she wishes to proceed with this. She does have history of hiatal hernia though no mention of this on her last upper endoscopic evaluation.   PLAN:  -continue omeprazole  40mg  daily -avoid NSAIDs as much as possible -schedule GES, hold phenergan  1 week prior -can use phenergan  PRN otherwise -work on smoking cessation -consider CT A/P with contrast if GES negative -TIF procedure brochure   All questions were answered, patient verbalized understanding and is in agreement with plan as outlined above.   Follow Up: 3 months  Genesee Nase L. Mariette, MSN, APRN, AGNP-C Adult-Gerontology Nurse Practitioner [facility]  I have reviewed the note and agree with the APP's assessment as described in this progress note  Toribio Fortune, MD Gastroenterology and  Hepatology [facility]

## 2024-05-08 NOTE — Patient Instructions (Signed)
-  continue omeprazole  40mg  daily -avoid NSAIDs as much as possible -schedule GES, hold phenergan  1 week prior -can use phenergan  as needed for nausea otherwise -work on smoking cessation -I am providing the TIF procedure brochure, if you have further questions or are interested, please reach out to us   Follow up 3 months  It was a pleasure to see you today. I want to create trusting relationships with patients and provide genuine, compassionate, and quality care. I truly value your feedback! please be on the lookout for a survey regarding your visit with me today. I appreciate your input about our visit and your time in completing this!    Delesha Pohlman L. Delani Kohli, MSN, APRN, AGNP-C Adult-Gerontology Nurse Practitioner [facility]

## 2024-05-08 NOTE — Telephone Encounter (Signed)
 Pt was checked in for her appointment today and I told her that we were giving all the patients a copy of our cancellation/no show policy. I asked for her to sign and date, then I would scan and give her the form for her records. She refused saying she was not going to sign if we were going to render our services. I said, OK that was her choice and the nurse would be out shortly to take her back.

## 2024-05-09 NOTE — Telephone Encounter (Signed)
 LMOVM to return call.

## 2024-05-10 NOTE — Telephone Encounter (Signed)
 Pt aware of GES

## 2024-05-14 ENCOUNTER — Encounter (HOSPITAL_COMMUNITY)
Admission: RE | Admit: 2024-05-14 | Discharge: 2024-05-14 | Disposition: A | Discharge status: Home / Self Care | Source: Ambulatory Visit | Attending: Gastroenterology | Admitting: Gastroenterology

## 2024-05-14 DIAGNOSIS — R112 Nausea with vomiting, unspecified: Secondary | ICD-10-CM | POA: Insufficient documentation

## 2024-05-14 DIAGNOSIS — R1013 Epigastric pain: Secondary | ICD-10-CM | POA: Diagnosis present

## 2024-05-14 MED ORDER — TECHNETIUM TC 99M SULFUR COLLOID
2.0000 | Freq: Once | INTRAVENOUS | Status: AC | PRN
  Administered 2024-05-14 (×2): 2.2 via INTRAVENOUS

## 2024-05-16 ENCOUNTER — Other Ambulatory Visit: Payer: Self-pay | Admitting: Internal Medicine

## 2024-05-20 ENCOUNTER — Other Ambulatory Visit: Payer: Self-pay | Admitting: Internal Medicine

## 2024-05-20 DIAGNOSIS — Z794 Long term (current) use of insulin: Secondary | ICD-10-CM

## 2024-05-21 ENCOUNTER — Ambulatory Visit (INDEPENDENT_AMBULATORY_CARE_PROVIDER_SITE_OTHER): Payer: Self-pay | Admitting: Gastroenterology

## 2024-05-25 ENCOUNTER — Other Ambulatory Visit: Payer: Self-pay | Admitting: *Deleted

## 2024-05-31 ENCOUNTER — Inpatient Hospital Stay: Payer: Medicare HMO | Attending: Oncology

## 2024-05-31 ENCOUNTER — Inpatient Hospital Stay: Payer: Medicare HMO | Admitting: Oncology

## 2024-05-31 VITALS — BP 112/61 | HR 88 | Temp 98.4°F | Resp 18 | Wt 186.3 lb

## 2024-05-31 DIAGNOSIS — F17210 Nicotine dependence, cigarettes, uncomplicated: Secondary | ICD-10-CM

## 2024-05-31 DIAGNOSIS — D72829 Elevated white blood cell count, unspecified: Secondary | ICD-10-CM

## 2024-05-31 DIAGNOSIS — Z1589 Genetic susceptibility to other disease: Secondary | ICD-10-CM | POA: Insufficient documentation

## 2024-05-31 DIAGNOSIS — Z1231 Encounter for screening mammogram for malignant neoplasm of breast: Secondary | ICD-10-CM

## 2024-05-31 DIAGNOSIS — Z9081 Acquired absence of spleen: Secondary | ICD-10-CM | POA: Diagnosis not present

## 2024-05-31 DIAGNOSIS — D3500 Benign neoplasm of unspecified adrenal gland: Secondary | ICD-10-CM

## 2024-05-31 LAB — COMPREHENSIVE METABOLIC PANEL WITH GFR
ALT: 19 U/L (ref 0–44)
AST: 29 U/L (ref 15–41)
Albumin: 3.9 g/dL (ref 3.5–5.0)
Alkaline Phosphatase: 83 U/L (ref 38–126)
Anion gap: 12 (ref 5–15)
BUN: 14 mg/dL (ref 8–23)
CO2: 23 mmol/L (ref 22–32)
Calcium: 9.7 mg/dL (ref 8.9–10.3)
Chloride: 104 mmol/L (ref 98–111)
Creatinine, Ser: 0.84 mg/dL (ref 0.44–1.00)
GFR, Estimated: >60 mL/min (ref >60)
Glucose, Bld: 137 mg/dL — ABNORMAL HIGH (ref 70–99)
Potassium: 4 mmol/L (ref 3.5–5.1)
Sodium: 139 mmol/L (ref 135–145)
Total Bilirubin: 0.7 mg/dL (ref 0.0–1.2)
Total Protein: 8 g/dL (ref 6.5–8.1)

## 2024-05-31 LAB — CBC WITH DIFFERENTIAL/PLATELET
Abs Immature Granulocytes: 0.04 K/uL (ref 0.00–0.07)
Basophils Absolute: 0.1 K/uL (ref 0.0–0.1)
Basophils Relative: 1 %
Eosinophils Absolute: 0.2 K/uL (ref 0.0–0.5)
Eosinophils Relative: 1 %
HCT: 48.6 % — ABNORMAL HIGH (ref 36.0–46.0)
Hemoglobin: 16.7 g/dL — ABNORMAL HIGH (ref 12.0–15.0)
Immature Granulocytes: 0 %
Lymphocytes Relative: 32 %
Lymphs Abs: 4.7 K/uL — ABNORMAL HIGH (ref 0.7–4.0)
MCH: 33.8 pg (ref 26.0–34.0)
MCHC: 34.4 g/dL (ref 30.0–36.0)
MCV: 98.4 fL (ref 80.0–100.0)
Monocytes Absolute: 1.1 K/uL — ABNORMAL HIGH (ref 0.1–1.0)
Monocytes Relative: 7 %
Neutro Abs: 8.6 K/uL — ABNORMAL HIGH (ref 1.7–7.7)
Neutrophils Relative %: 59 %
Platelets: 418 K/uL — ABNORMAL HIGH (ref 150–400)
RBC: 4.94 MIL/uL (ref 3.87–5.11)
RDW: 13.2 % (ref 11.5–15.5)
WBC: 14.7 K/uL — ABNORMAL HIGH (ref 4.0–10.5)
nRBC: 0 % (ref 0.0–0.2)

## 2024-05-31 LAB — LACTATE DEHYDROGENASE: LDH: 173 U/L (ref 98–192)

## 2024-05-31 NOTE — Progress Notes (Signed)
 [facility] OFFICE PROGRESS NOTE  Annette Rollene BRAVO, MD  ASSESSMENT & PLAN:  Assessment & Plan Adrenal adenoma, unspecified laterality -She had incidental right adrenal nodules found on CT scan of the chest. -Blood work and 24-hour urinary metanephrines, normetanephrine's and cortisol levels were within normal limits. -Continue follow-ups with Dr. Vianne.  Breast cancer screening by mammogram -Diagnosed on 12/10/2005, status post lumpectomy by Dr. Elenore in [place]. -Status post Taxotere and Cytoxan plus Herceptin for 6 cycles followed by 1 year of Herceptin. -5 years of tamoxifen completed in November 2012. -We reviewed the results of the mammogram from 01/23/2024 which showed left breast possible asymmetry and recommended a diagnostic mammogram with ultrasound.  Diagnostic mammogram from 02/06/2024 was read as BI-RADS 4 suspicious and she ultimately had a biopsy. We have scheduled her for next mammogram. Benign breast showing fibrosis, fat necrosis and acute and chronic inflammation consistent with prior procedure/lumpectomy. Negative for microcalcifications. Negative for atypia and carcinoma  -She will come back in a year for follow-up.   Leukocytosis, unspecified type - Denies any fevers, night sweats or weight loss.  She is having migraine headaches.  No echogenic pruritus. - Reviewed labs from 05/31/24: Normal LFTs.  Creatinine 0.84.  CBC shows white count 14.7 and platelet count 418.  Hemoglobin is 16.7.  - Previous workup with flow cytometry was negative.  - RTC 1 year for follow-up. Cigarette smoker LDCT last on 06/09/23. Due for repeat.  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 - She has received her PCV 13 pneumococcal vaccine at her [facility] office on 07/28/2018.  We will give her meningococcal vaccine today.  These will be repeated every 5 years.  Orders Placed This Encounter  Procedures   MM 3D SCREENING MAMMOGRAM  BILATERAL BREAST    Standing Status:   Future    Expected Date:   01/21/2025    Expiration Date:   05/31/2025    Reason for Exam (SYMPTOM  OR DIAGNOSIS REQUIRED):   breast cancer screen    Preferred imaging location?:   [facility]   CBC with Differential/Platelet    Standing Status:   Future    Expected Date:   06/01/2025    Expiration Date:   07/01/2025    Release to patient:   Immediate   Comprehensive metabolic panel    Standing Status:   Future    Expected Date:   06/01/2025    Expiration Date:   07/01/2025    Release to patient:   Immediate   Lactate dehydrogenase    Standing Status:   Future    Expected Date:   06/01/2025    Expiration Date:   07/01/2025    Release to patient:   Immediate    INTERVAL HISTORY: Patient returns for follow-up for elevated WBC, Hemoglobin and platelets count. She has history of breast cancer, splenectomy, adrenalectomy and tobacco abuse.   Reports fatigue at baseline. Occasional N/V. Insomnia at times. Occasional dizziness, HA and migraines. Denies any hyperviscosity symptoms. Denies recent infection. No antibiotics.   Recently had upper GI which showed gastritis. Evaluated for abdominal pain, N/V. Sx have improved. Reports neck pain when she turns her head to the left. Thinks it may be musculoskeletal.   We reviewed cbc, cmp and ldh.   SUMMARY OF HEMATOLOGIC HISTORY: Oncology History   No history exists.     CBC    Component Value Date/Time   WBC 14.7 (H) 05/31/2024 1346  RBC 4.94 05/31/2024 1346   HGB 16.7 (H) 05/31/2024 1346   HGB 15.9 05/19/2023 1602   HCT 48.6 (H) 05/31/2024 1346   HCT 45.5 05/19/2023 1602   PLT 418 (H) 05/31/2024 1346   PLT 373 05/19/2023 1602   MCV 98.4 05/31/2024 1346   MCV 95 05/19/2023 1602   MCH 33.8 05/31/2024 1346   MCHC 34.4 05/31/2024 1346   RDW 13.2 05/31/2024 1346   RDW 11.9 05/19/2023 1602   LYMPHSABS 4.7 (H) 05/31/2024 1346   LYMPHSABS 3.8 (H) 12/11/2020 1340   MONOABS 1.1 (H) 05/31/2024  1346   EOSABS 0.2 05/31/2024 1346   EOSABS 1.0 (H) 12/11/2020 1340   BASOSABS 0.1 05/31/2024 1346   BASOSABS 0.1 12/11/2020 1340       Latest Ref Rng & Units 05/31/2024    1:46 PM 05/04/2024    3:59 PM 01/03/2024    4:14 PM  CMP  Glucose 70 - 99 mg/dL 862  97    BUN 8 - 23 mg/dL 14  14    Creatinine 9.55 - 1.00 mg/dL 9.15  8.84    Sodium 864 - 145 mmol/L 139  141    Potassium 3.5 - 5.1 mmol/L 4.0  4.2    Chloride 98 - 111 mmol/L 104  104    CO2 22 - 32 mmol/L 23  22    Calcium  8.9 - 10.3 mg/dL 9.7  89.9    Total Protein 6.5 - 8.1 g/dL 8.0  7.2  7.1   Total Bilirubin 0.0 - 1.2 mg/dL 0.7  0.4  0.5   Alkaline Phos 38 - 126 U/L 83  90  82   AST 15 - 41 U/L 29  29  23    ALT 0 - 44 U/L 19  24  16       Lab Results  Component Value Date   FERRITIN 98 11/01/2019   VITAMINB12 251 03/12/2020    Vitals:   05/31/24 1435  BP: 112/61  Pulse: 88  Resp: 18  Temp: 98.4 F (36.9 C)  SpO2: 100%    Review of System:  Review of Systems  Constitutional:  Positive for malaise/fatigue.  Musculoskeletal:  Positive for back pain, joint pain and neck pain.  Neurological:  Positive for dizziness and headaches.  Psychiatric/Behavioral:  The patient has insomnia.     Physical Exam: Physical Exam Constitutional:      Appearance: Normal appearance.  HENT:     Head: Normocephalic and atraumatic.  Eyes:     Pupils: Pupils are equal, round, and reactive to light.  Cardiovascular:     Rate and Rhythm: Normal rate and regular rhythm.     Heart sounds: Normal heart sounds. No murmur heard. Pulmonary:     Effort: Pulmonary effort is normal.     Breath sounds: Normal breath sounds. No wheezing.  Abdominal:     General: Bowel sounds are normal. There is no distension.     Palpations: Abdomen is soft.     Tenderness: There is no abdominal tenderness.  Musculoskeletal:        General: Normal range of motion.     Cervical back: Normal range of motion.  Skin:    General: Skin is warm and dry.      Findings: No rash.  Neurological:     Mental Status: She is alert and oriented to person, place, and time.     Gait: Gait is intact.  Psychiatric:        Mood and Affect:  Mood and affect normal.        Cognition and Memory: Memory normal.        Judgment: Judgment normal.      I spent 20 minutes dedicated to the care of this patient (face-to-face and non-face-to-face) on the date of the encounter to include what is described in the assessment and plan.,  Delon Hope, NP 05/31/2024 6:48 PM

## 2024-05-31 NOTE — Assessment & Plan Note (Signed)
1.  Stage I left breast cancer, ER positive and HER-2 positive: -Diagnosed on 12/10/2005, status post lumpectomy by Dr. Anders Grant in [place]. -Status post Taxotere and Cytoxan plus Herceptin for 6 cycles followed by 1 year of Herceptin. -5 years of tamoxifen completed in November 2012. - Physical examination today was within normal limits. -We reviewed the results of the mammogram dated 05/16/2018 which was BI-RADS Category 2. -She will come back in a year for follow-up.  We have scheduled her for next mammogram.  2.  Splenectomy: -She had left adrenalectomy and splenectomy on 02/16/2012 for adrenal adenoma. - She has received her PCV 13 pneumococcal vaccine at her [place] office on 07/28/2018.  We will give her meningococcal vaccine today.  These will be repeated every 5 years.

## 2024-05-31 NOTE — Assessment & Plan Note (Deleted)
-   Denies any fevers, night sweats or weight loss.  She is having migraine headaches.  No echogenic pruritus. - Reviewed labs from 05/31/2023: Normal LFTs.  Creatinine 1.04.  CBC shows white count 14.9 and platelet count 376.  Hemoglobin is 16.  Differential shows elevated absolute lymphocyte count. - Previous workup with flow cytometry was negative for lymphoproliferative process. - RTC 1 year for follow-up.

## 2024-05-31 NOTE — Assessment & Plan Note (Addendum)
 LDCT last on 06/09/23. Due for repeat.

## 2024-05-31 NOTE — Assessment & Plan Note (Addendum)
-   Denies any fevers, night sweats or weight loss.  She is having migraine headaches.  No echogenic pruritus. - Reviewed labs from 05/31/24: Normal LFTs.  Creatinine 0.84.  CBC shows white count 14.7 and platelet count 418.  Hemoglobin is 16.7.  - Previous workup with flow cytometry was negative.  - RTC 1 year for follow-up.

## 2024-05-31 NOTE — Assessment & Plan Note (Addendum)
-  She had incidental right adrenal nodules found on CT scan of the chest. -Blood work and 24-hour urinary metanephrines, normetanephrine's and cortisol levels were within normal limits. -Continue follow-ups with Dr. Vianne.

## 2024-06-11 ENCOUNTER — Telehealth (HOSPITAL_BASED_OUTPATIENT_CLINIC_OR_DEPARTMENT_OTHER): Payer: Self-pay

## 2024-06-11 NOTE — Telephone Encounter (Signed)
 CMN received for CPAP supplies signed by provider and faxed confirmation received

## 2024-06-13 ENCOUNTER — Other Ambulatory Visit: Payer: Self-pay | Admitting: Family Medicine

## 2024-06-18 ENCOUNTER — Other Ambulatory Visit: Payer: Self-pay

## 2024-06-18 ENCOUNTER — Ambulatory Visit: Payer: Self-pay

## 2024-06-18 ENCOUNTER — Emergency Department (HOSPITAL_COMMUNITY)

## 2024-06-18 ENCOUNTER — Encounter (HOSPITAL_COMMUNITY): Payer: Self-pay | Admitting: Emergency Medicine

## 2024-06-18 ENCOUNTER — Emergency Department (HOSPITAL_COMMUNITY)
Admission: EM | Admit: 2024-06-18 | Discharge: 2024-06-18 | Disposition: A | Discharge status: Home / Self Care | Attending: Emergency Medicine | Admitting: Emergency Medicine

## 2024-06-18 DIAGNOSIS — E119 Type 2 diabetes mellitus without complications: Secondary | ICD-10-CM | POA: Diagnosis not present

## 2024-06-18 DIAGNOSIS — E039 Hypothyroidism, unspecified: Secondary | ICD-10-CM | POA: Insufficient documentation

## 2024-06-18 DIAGNOSIS — Z853 Personal history of malignant neoplasm of breast: Secondary | ICD-10-CM | POA: Diagnosis not present

## 2024-06-18 DIAGNOSIS — Z7984 Long term (current) use of oral hypoglycemic drugs: Secondary | ICD-10-CM | POA: Diagnosis not present

## 2024-06-18 DIAGNOSIS — S161XXA Strain of muscle, fascia and tendon at neck level, initial encounter: Secondary | ICD-10-CM | POA: Insufficient documentation

## 2024-06-18 DIAGNOSIS — M436 Torticollis: Secondary | ICD-10-CM | POA: Insufficient documentation

## 2024-06-18 DIAGNOSIS — F17210 Nicotine dependence, cigarettes, uncomplicated: Secondary | ICD-10-CM | POA: Insufficient documentation

## 2024-06-18 DIAGNOSIS — Z794 Long term (current) use of insulin: Secondary | ICD-10-CM | POA: Insufficient documentation

## 2024-06-18 DIAGNOSIS — X509XXA Other and unspecified overexertion or strenuous movements or postures, initial encounter: Secondary | ICD-10-CM | POA: Diagnosis not present

## 2024-06-18 DIAGNOSIS — M542 Cervicalgia: Secondary | ICD-10-CM | POA: Diagnosis present

## 2024-06-18 LAB — BASIC METABOLIC PANEL WITH GFR
Anion gap: 13 (ref 5–15)
BUN: 19 mg/dL (ref 8–23)
CO2: 24 mmol/L (ref 22–32)
Calcium: 9.3 mg/dL (ref 8.9–10.3)
Chloride: 101 mmol/L (ref 98–111)
Creatinine, Ser: 1.18 mg/dL — ABNORMAL HIGH (ref 0.44–1.00)
GFR, Estimated: 52 mL/min — ABNORMAL LOW (ref >60)
Glucose, Bld: 98 mg/dL (ref 70–99)
Potassium: 3.8 mmol/L (ref 3.5–5.1)
Sodium: 138 mmol/L (ref 135–145)

## 2024-06-18 LAB — CBC WITH DIFFERENTIAL/PLATELET
Abs Immature Granulocytes: 0.06 K/uL (ref 0.00–0.07)
Basophils Absolute: 0.1 K/uL (ref 0.0–0.1)
Basophils Relative: 1 %
Eosinophils Absolute: 0.1 K/uL (ref 0.0–0.5)
Eosinophils Relative: 0 %
HCT: 43.9 % (ref 36.0–46.0)
Hemoglobin: 14.8 g/dL (ref 12.0–15.0)
Immature Granulocytes: 0 %
Lymphocytes Relative: 27 %
Lymphs Abs: 4.4 K/uL — ABNORMAL HIGH (ref 0.7–4.0)
MCH: 33.2 pg (ref 26.0–34.0)
MCHC: 33.7 g/dL (ref 30.0–36.0)
MCV: 98.4 fL (ref 80.0–100.0)
Monocytes Absolute: 1.6 K/uL — ABNORMAL HIGH (ref 0.1–1.0)
Monocytes Relative: 10 %
Neutro Abs: 10 K/uL — ABNORMAL HIGH (ref 1.7–7.7)
Neutrophils Relative %: 62 %
Platelets: 368 K/uL (ref 150–400)
RBC: 4.46 MIL/uL (ref 3.87–5.11)
RDW: 13.2 % (ref 11.5–15.5)
WBC: 16.2 K/uL — ABNORMAL HIGH (ref 4.0–10.5)
nRBC: 0 % (ref 0.0–0.2)

## 2024-06-18 MED ORDER — ONDANSETRON HCL 4 MG/2ML IJ SOLN
4.0000 mg | Freq: Once | INTRAMUSCULAR | Status: AC
Start: 2024-06-18 — End: 2024-06-18
  Administered 2024-06-18: 4 mg via INTRAVENOUS
  Filled 2024-06-18: qty 2

## 2024-06-18 MED ORDER — OXYCODONE HCL 5 MG PO TABS: 5.0000 mg | ORAL_TABLET | ORAL | 0 refills | Status: AC | PRN

## 2024-06-18 MED ORDER — KETOROLAC TROMETHAMINE 15 MG/ML IJ SOLN
15.0000 mg | Freq: Once | INTRAMUSCULAR | Status: AC
  Administered 2024-06-18: 15 mg via INTRAMUSCULAR
  Filled 2024-06-18: qty 1

## 2024-06-18 MED ORDER — LIDOCAINE 5 % EX PTCH
1.0000 | MEDICATED_PATCH | Freq: Once | CUTANEOUS | Status: DC
  Administered 2024-06-18: 1 via TRANSDERMAL
  Filled 2024-06-18: qty 1

## 2024-06-18 MED ORDER — LIDOCAINE 5 % EX PTCH: 1.0000 | MEDICATED_PATCH | Freq: Every day | CUTANEOUS | 0 refills | Status: AC | PRN

## 2024-06-18 MED ORDER — ACETAMINOPHEN 325 MG PO TABS: 650.0000 mg | ORAL_TABLET | Freq: Four times a day (QID) | ORAL | 0 refills | Status: DC | PRN

## 2024-06-18 MED ORDER — ACETAMINOPHEN 500 MG PO TABS
1000.0000 mg | ORAL_TABLET | Freq: Once | ORAL | Status: AC
  Administered 2024-06-18: 1000 mg via ORAL
  Filled 2024-06-18: qty 2

## 2024-06-18 MED ORDER — MORPHINE SULFATE (PF) 4 MG/ML IV SOLN
4.0000 mg | Freq: Once | INTRAVENOUS | Status: AC
  Administered 2024-06-18: 4 mg via INTRAVENOUS
  Filled 2024-06-18: qty 1

## 2024-06-18 MED ORDER — OXYCODONE HCL 5 MG PO TABS
5.0000 mg | ORAL_TABLET | Freq: Once | ORAL | Status: AC
  Administered 2024-06-18: 5 mg via ORAL
  Filled 2024-06-18: qty 1

## 2024-06-18 MED ORDER — METHOCARBAMOL 500 MG PO TABS
1000.0000 mg | ORAL_TABLET | Freq: Two times a day (BID) | ORAL | 0 refills | Status: AC
Start: 2024-06-18 — End: 2024-06-23

## 2024-06-18 MED ORDER — IOHEXOL 300 MG/ML  SOLN
75.0000 mL | Freq: Once | INTRAMUSCULAR | Status: AC | PRN
  Administered 2024-06-18: 75 mL via INTRAVENOUS

## 2024-06-18 MED ORDER — IBUPROFEN 600 MG PO TABS: 600.0000 mg | ORAL_TABLET | Freq: Four times a day (QID) | ORAL | 0 refills | Status: AC | PRN

## 2024-06-18 MED ORDER — LIDOCAINE VISCOUS HCL 2 % MT SOLN
15.0000 mL | Freq: Once | OROMUCOSAL | Status: AC
  Administered 2024-06-18: 15 mL via OROMUCOSAL
  Filled 2024-06-18: qty 15

## 2024-06-18 MED ORDER — CYCLOBENZAPRINE HCL 10 MG PO TABS
10.0000 mg | ORAL_TABLET | Freq: Once | ORAL | Status: AC
  Administered 2024-06-18: 10 mg via ORAL
  Filled 2024-06-18: qty 1

## 2024-06-18 NOTE — ED Provider Notes (Signed)
 [place] EMERGENCY DEPARTMENT AT [facility] Provider Note  CSN: [id number] Arrival date & time: 06/18/24 1327  Chief Complaint(s) Neck Pain  HPI TALEYAH HILLMAN is a 63 y.o. female with past medical history as below, significant for depression, DM, GERD, IBS who presents to the ED with complaint of neck pain  Patient ports symptoms ongoing for around 3 days, she had similar presentation in September 2023, patient reports her symptoms feel nearly exactly the same but seem to be worse.  Denies recent falls or injuries, no heavy lifting.  Pain primarily to left side of her neck but does also include her right side.  Pain worsened with neck movement or arm movement.  He tried leftover pain medication from ER visit in September which did not significantly improve her symptoms.  No fevers or chills, no chest pain or vomiting.  No postnasal drip or rhinorrhea.  Past Medical History Past Medical History:  Diagnosis Date   Acute blood loss anemia 02/16/2012   Allergy    Arthritis    Phreesia 10/14/2020   Blood transfusion without reported diagnosis    Phreesia 10/14/2020   Breast cancer (HCC) 2007   History of  XRT, onTamoxifen   Bruises easily    Cancer (HCC)    Phreesia 10/14/2020   Depression    Depression    Phreesia 10/14/2020   Diabetes mellitus    prediabetic   Diabetes mellitus without complication (HCC)    Phreesia 10/14/2020   GERD (gastroesophageal reflux disease)    Hypertriglyceridemia    Hypothyroidism    following chemo amnd radiaition for breast cancer, needed replacement short term   Hypothyroidism (acquired)    replaced x 1 year   IBS (irritable bowel syndrome)    Menieres disease    Controlled with triamterene     Migraines    Obesity    Palpitations    Personal history of radiation therapy    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HCC)    Sleep apnea 2010   Problems with CPAP   Patient Active Problem List   Diagnosis Date Noted   JAK-2 gene mutation  05/31/2024   Abdominal pain, epigastric 05/08/2024   Gastritis and gastroduodenitis 02/28/2024   Ventral hernia without obstruction or gangrene 02/09/2024   Personal history of colon polyps, unspecified 01/11/2024   Nausea and vomiting 01/11/2024   Encounter for Medicare annual wellness exam 01/10/2024   Centrilobular emphysema (HCC) 05/19/2023   Encounter for Medicare annual examination with abnormal findings 09/18/2022   Neck muscle spasm 06/15/2022   Early satiety 01/24/2022   DOE (dyspnea on exertion) 10/15/2021   Cigarette smoker 10/15/2021   Adrenal nodule (HCC) 08/31/2021   Left hip pain 08/25/2021   Left knee pain 08/25/2021   Neuropathy 03/04/2021   History of colonic polyps    Syncope 01/17/2021   Intractable pain 01/17/2021   Leukocytosis 01/17/2021   Fusion of spine, lumbar region 12/11/2020   Pain associated with surgical procedure 12/11/2020   Disc degeneration, lumbar 12/03/2020   Pure hypercholesterolemia 11/07/2020   Skin lesion of chest wall 10/07/2020   Adrenal mass (HCC) 05/30/2020   COVID-19 virus infection 11/01/2019   Lab test positive for detection of COVID-19 virus 11/01/2019   Neck pain on left side 08/28/2019   Extensor tenosynovitis of wrist, left 08/28/2019   Back pain 03/25/2019   Light headedness 01/08/2019   At risk for cardiovascular event 01/30/2018   Palpitations 03/27/2015   Hyperlipidemia LDL goal <100 07/15/2014   GERD (gastroesophageal reflux  disease) 10/09/2013   GAD (generalized anxiety disorder) 05/14/2013   Metabolic syndrome X 05/14/2013   Nicotine  dependence 01/09/2013   Insomnia 08/14/2012   Type 2 diabetes mellitus with other specified complication (HCC) 04/16/2012   History of partial thyroidectomy 12/31/2011   Adrenal adenoma 08/06/2011   Meniere's disease 04/22/2011   OSA (obstructive sleep apnea) 04/22/2011   ABNORMAL THYROID  FUNCTION TESTS 08/17/2010   Vitamin D  deficiency 01/15/2010   Obesity (BMI 30.0-34.9)  01/01/2010   Irritable bowel syndrome with diarrhea 01/01/2010   BREAST CANCER, HX OF 01/01/2010   Home Medication(s) Prior to Admission medications   Medication Sig Start Date End Date Taking? Authorizing Provider  albuterol  (VENTOLIN  HFA) 108 (90 Base) MCG/ACT inhaler TAKE 2 PUFFS BY MOUTH EVERY 6 HOURS AS NEEDED FOR WHEEZE OR SHORTNESS OF BREATH    [provider]  butalbital -acetaminophen -caffeine  (FIORICET) 50-325-40 MG tablet Take one tablet by mouth every 8  to 12 hours , as needed, for uncontrolled headache 01/10/24   Antonetta Rollene BRAVO, MD  Continuous Glucose Receiver (FREESTYLE LIBRE 3 READER) DEVI USE TO CHECK GLUCOSE CONTINUOUSLY. 05/16/24   Trixie File, MD  Continuous Glucose Sensor (FREESTYLE LIBRE 3 PLUS SENSOR) MISC Inject 1 Device into the skin continuous. Change every 15 days 02/20/24   Trixie File, MD  fluticasone  (FLONASE ) 50 MCG/ACT nasal spray SPRAY 2 SPRAYS INTO EACH NOSTRIL EVERY DAY Patient taking differently: as needed. 01/24/24   Antonetta Rollene BRAVO, MD  glipiZIDE  (GLUCOTROL ) 5 MG tablet Take 1 tablet (5 mg total) by mouth daily before supper. 03/16/24   Trixie File, MD  insulin  degludec (TRESIBA  FLEXTOUCH) 200 UNIT/ML FlexTouch Pen Inject 18 Units into the skin daily. 05/21/24   Trixie File, MD  Insulin  Pen Needle (BD PEN NEEDLE NANO 2ND GEN) 32G X 4 MM MISC USE 4X DAILY AS DIRECTED 10/19/22   Trixie File, MD  Lancets [facility] DELICA PLUS [place]) MISC USE TO TEST 4 TIMES A DAY 07/26/22   [place], MD  LORazepam  (ATIVAN ) 1 MG tablet Take one tablet by mouth once daily, as needed, for uncontrolled nausea 05/04/24   Antonetta Rollene BRAVO, MD  omeprazole  (PRILOSEC) 40 MG capsule TAKE 1 CAPSULE DAILY 03/12/24   Antonetta Rollene BRAVO, MD  [facility] VERIO test strip USE AS DIRECTED 2 TIMES DAILY 05/26/23   Trixie File, MD  promethazine  (PHENERGAN ) 12.5 MG tablet Take 1 tablet (12.5 mg total) by mouth every 8 (eight) hours as needed for  nausea or vomiting. 05/08/24   Carlan, Chelsea L, NP  rosuvastatin  (CRESTOR ) 20 MG tablet TAKE 1 TABLET DAILY 02/06/24   Antonetta Rollene BRAVO, MD  temazepam  (RESTORIL ) 15 MG capsule Take 1 capsule (15 mg total) by mouth at bedtime. 05/04/24   Antonetta Rollene BRAVO, MD  traZODone  (DESYREL ) 100 MG tablet TAKE 1 TABLET BY MOUTH EVERYDAY AT BEDTIME 06/13/24   Antonetta Rollene BRAVO, MD  triamterene -hydrochlorothiazide  (DYAZIDE ) 37.5-25 MG capsule TAKE 1 CAPSULE EVERY       MORNING 03/12/24   Antonetta Rollene BRAVO, MD  UNABLE TO FIND CPAP tubing and supplies, face mask Dx: G47.30 06/16/21   Antonetta Rollene BRAVO, MD  venlafaxine  XR (EFFEXOR -XR) 75 MG 24 hr capsule TAKE 1 CAPSULE DAILY 04/17/24   Antonetta Rollene BRAVO, MD  Past Surgical History Past Surgical History:  Procedure Laterality Date   ABDOMINAL EXPOSURE N/A 12/03/2020   Procedure: ABDOMINAL EXPOSURE;  Surgeon: Oris Krystal FALCON, MD;  Location[facility] OR;  Service: Vascular;  Laterality: N/A;   ABDOMINAL HYSTERECTOMY  1998   Benign, fibroids   ADRENALECTOMY  02/16/2012   [facility], splemic trauma, 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   ANTERIOR LUMBAR FUSION N/A 12/03/2020   Procedure: Lumbar Four-Five Lumbar Five Sacral One Anterior lumbar interbody fusion;  Surgeon: Gillie Duncans, MD;  Location: [facility] OR;  Service: Neurosurgery;  Laterality: N/A;   APPENDECTOMY  06/12/2008   BIOPSY  07/02/2020   Procedure: BIOPSY;  Surgeon: Eartha Angelia Sieving, MD;  Location: [facility];  Service: Gastroenterology;;   BIOPSY  01/22/2021   Procedure: BIOPSY;  Surgeon: Shaaron Lamar HERO, MD;  [facility]: [facility];  Service: Endoscopy;;   BIOPSY  02/17/2022   Procedure: BIOPSY;  Surgeon: Golda Claudis PENNER, MD;  [facility];  Service: Endoscopy;;   BREAST BIOPSY Left 02/21/2024   US  LT BREAST BX W LOC DEV 1ST LESION IMG BX SPEC US  GUIDE 02/21/2024  [facility]-ULTRASOUND   BREAST LUMPECTOMY Left 2008   BREAST RECONSTRUCTION     Left reconstructive   BREAST SURGERY     Mammosite - right side   CHOLECYSTECTOMY  1985   COLONOSCOPY WITH PROPOFOL  N/A 07/02/2020   Procedure: COLONOSCOPY WITH PROPOFOL ;  Surgeon: Eartha Angelia Sieving, MD;  [facility];  Service: Gastroenterology;  Laterality: N/A;  1045   COLONOSCOPY WITH PROPOFOL  N/A 04/01/2021   Procedure: COLONOSCOPY WITH PROPOFOL ;  Surgeon: Eartha Angelia Sieving, MD;  [facility];  Service: Gastroenterology;  Laterality: N/A;  12:50   ESOPHAGEAL DILATION  12/19/2015   Procedure: ESOPHAGEAL DILATION;  Surgeon: Claudis PENNER Golda, MD;  [facility];  Service: Endoscopy;;   ESOPHAGOGASTRODUODENOSCOPY N/A 12/19/2015   Procedure: ESOPHAGOGASTRODUODENOSCOPY (EGD);  Surgeon: Claudis PENNER Golda, MD;  [facility];  Service: Endoscopy;  Laterality: N/A;  11:40   ESOPHAGOGASTRODUODENOSCOPY N/A 02/28/2024   Procedure: EGD (ESOPHAGOGASTRODUODENOSCOPY);  Surgeon: Cinderella Deatrice FALCON, MD;  [facility];  Service: Endoscopy;  Laterality: N/A;  11:30AM;ASA 2   ESOPHAGOGASTRODUODENOSCOPY (EGD) WITH PROPOFOL  N/A 02/17/2022   Procedure: ESOPHAGOGASTRODUODENOSCOPY (EGD) WITH PROPOFOL ;  Surgeon: Golda Claudis PENNER, MD;  [facility];  Service: Endoscopy;  Laterality: N/A;  1045   FLEXIBLE SIGMOIDOSCOPY N/A 01/22/2021   Procedure: FLEXIBLE SIGMOIDOSCOPY;  Surgeon: Shaaron Lamar HERO, MD;  [facility];  Service: Endoscopy;  Laterality: N/A;   HEMOSTASIS CLIP PLACEMENT  04/01/2021   Procedure: HEMOSTASIS CLIP PLACEMENT;  Surgeon: Eartha Angelia Sieving, MD;  [facility];  Service: Gastroenterology;;  x2   POLYPECTOMY  07/02/2020   Procedure: POLYPECTOMY;  Surgeon: Eartha Angelia Sieving, MD;  [facility];  Service: Gastroenterology;;   POLYPECTOMY  04/01/2021   Procedure: POLYPECTOMY INTESTINAL;  Surgeon: Eartha Angelia Sieving, MD;  [facility];  Service: Gastroenterology;;   SPINE SURGERY     SPLENECTOMY, 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   THYROIDECTOMY, PARTIAL  11/05/2010   Benign disease   TUBAL LIGATION     Family History Family History  Problem Relation Age of Onset   Diabetes Mother    Arrhythmia Mother    Varicose Veins Mother    Heart disease Father    Hyperlipidemia Father    Arrhythmia Father    Depression Father  Diabetes Sister    Breast cancer Paternal Grandmother    Cancer Paternal Grandmother        Breast   Diabetes Brother    Heart disease Brother     Social History Social History   Tobacco Use   Smoking status: Every Day    Current packs/day: 0.50    Average packs/day: 0.5 packs/day for 40.0 years (20.0 ttl pk-yrs)    Types: Cigarettes   Smokeless tobacco: Never   Tobacco comments:    1/2 pack a day    02/15/2023 Patient smokes about 1/2 pack daily  Vaping Use   Vaping status: Never Used  Substance Use Topics   Alcohol use: No    Comment: Encouraged to quit smoking. She has tried the nicotone gum and patches but did not help   Drug use: No   Allergies Penicillins, Metformin  and related, Wound dressing adhesive, Jardiance [empagliflozin], Statins, and Zetia  [ezetimibe ]  Review of Systems A thorough review of systems was obtained and all systems are negative except as noted in the HPI and PMH.   Physical Exam Vital Signs  I have reviewed the triage vital signs BP 111/63   Pulse 82   Temp 98.9 F (37.2 C) (Oral)   Resp 16   Ht 5' 6 (1.676 m)   Wt 84 kg   SpO2 96%   BMI 29.89 kg/m  Physical Exam Vitals and nursing note reviewed.  Constitutional:      General: She is not in acute distress.    Appearance: Normal appearance. She is well-developed. She is not ill-appearing.  HENT:     Head: Normocephalic and atraumatic. No raccoon eyes, Battle's sign, right periorbital erythema or left  periorbital erythema.     Jaw: There is normal jaw occlusion. No trismus.     Comments: No drooling trismus stridor    Right Ear: External ear normal.     Left Ear: External ear normal.     Nose: Nose normal.     Mouth/Throat:     Mouth: Mucous membranes are moist.     Pharynx: Oropharynx is clear. Uvula midline.  Eyes:     General: No scleral icterus.       Right eye: No discharge.        Left eye: No discharge.  Neck:      Comments: Muscle spasm Cardiovascular:     Rate and Rhythm: Normal rate.  Pulmonary:     Effort: Pulmonary effort is normal. No respiratory distress.     Breath sounds: No stridor.  Abdominal:     General: Abdomen is flat. There is no distension.     Tenderness: There is no guarding.  Musculoskeletal:        General: No deformity.     Cervical back: No rigidity. Pain with movement and muscular tenderness present. No spinous process tenderness.  Skin:    General: Skin is warm and dry.     Coloration: Skin is not cyanotic, jaundiced or pale.  Neurological:     Mental Status: She is alert.  Psychiatric:        Speech: Speech normal.        Behavior: Behavior normal. Behavior is cooperative.     ED Results and Treatments Labs (all labs ordered are listed, but only abnormal results are displayed) Labs Reviewed  CBC WITH DIFFERENTIAL/PLATELET - Abnormal; Notable for the following components:      Result Value   WBC 16.2 (*)    Neutro Abs  10.0 (*)    Lymphs Abs 4.4 (*)    Monocytes Absolute 1.6 (*)    All other components within normal limits  BASIC METABOLIC PANEL WITH GFR - Abnormal; Notable for the following components:   Creatinine, Ser 1.18 (*)    GFR, Estimated 52 (*)    All other components within normal limits  GROUP A STREP BY PCR                                                                                                                          Radiology CT Soft Tissue Neck W Contrast Result Date: 06/18/2024 CLINICAL DATA:   dysphagia, neck pain EXAM: CT NECK WITH CONTRAST TECHNIQUE: Multidetector CT imaging of the neck was performed using the standard protocol following the bolus administration of intravenous contrast. RADIATION DOSE REDUCTION: This exam was performed according to the departmental dose-optimization program which includes automated exposure control, adjustment of the mA and/or kV according to patient size and/or use of iterative reconstruction technique. CONTRAST:  75mL OMNIPAQUE  IOHEXOL  300 MG/ML  SOLN COMPARISON:  None Available. FINDINGS: Pharynx and larynx: Normal. No mass or swelling. Closed glottis which limits assessment of the larynx. Salivary glands: Absent right submandibular gland. Thyroid : Absent left thyroid  gland.  Otherwise, unremarkable. Lymph nodes: None enlarged or abnormal density. Vascular: Major arteries are patent in neck. Limited evaluation due to non arterial timing. Limited intracranial: Negative. Visualized orbits: Negative. Mastoids and visualized paranasal sinuses: Clear. Skeleton: No acute or aggressive process. Upper chest: 3 mm pulmonary nodule in the left upper lobe. Emphysema. IMPRESSION: 1. No evidence of acute abnormality. 2. 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 3. 3 mm pulmonary nodule in the left upper lobe. If patient is low risk for malignancy, no routine follow-up imaging is recommended. If patient is high risk for malignancy, a non-contrast chest CT at 12 months is optional.This recommendation follows the consensus statement: Guidelines for Management of Incidental Pulmonary Nodules Detected on CT Images: From the Fleischner Society 2017; Radiology 2017; [phone]. Electronically Signed   By: [name] GORMAN Molt M.D.   On: 06/18/2024 21:09   CT C-SPINE NO CHARGE Result Date: 06/18/2024 CLINICAL DATA:  Pt c/o of neck pain x3 days and today has started to have pain when she swallows. EXAM: CT CERVICAL SPINE WITHOUT CONTRAST TECHNIQUE: Multidetector CT imaging of  the cervical spine was obtained as a reconstruction from CT neck with contrast. Multiplanar CT image reconstructions were also generated. RADIATION DOSE REDUCTION: This exam was performed according to the departmental dose-optimization program which includes automated exposure control, adjustment of the mA and/or kV according to patient size and/or use of iterative reconstruction technique. COMPARISON:  X-ray cervical spine 09/14/2019. FINDINGS: Alignment: Normal. Skull base and vertebrae: No acute fracture. No aggressive appearing focal osseous lesion or focal pathologic process. Soft tissues and spinal canal: No prevertebral fluid or swelling. No visible canal hematoma. Upper chest: Right upper lobe posterior segment thin wall cystic lesion likely benign in etiology.  Bilateral upper lobe pulmonary micronodules measuring up to 5 mm. Mild paraseptal emphysematous changes. Other: Atherosclerotic plaque of the aortic arch and its main branches. Left thyroid  gland not visualized likely removed. IMPRESSION: 1. No acute displaced fracture or traumatic listhesis of the cervical spine. 2. Bilateral upper lobe pulmonary micronodules measuring up to 5 mm. Findings likely infectious or inflammatory in etiology. Per Fleischner Society Guidelines,if patient is low risk for malignancy, no routine follow-up imaging is recommended. If patient is high risk for malignancy, a non-contrast Chest CT at 12 months is optional. If performed and the nodule is stable at 12 months, no further follow-up is recommended. These guidelines do not apply to immunocompromised patients and patients with cancer. Follow up in patients with significant comorbidities as clinically warranted. For lung cancer screening, adhere to Lung-RADS guidelines. Reference: Radiology. 2017; 284(1):228-43. Electronically Signed   By: Morgane  Naveau M.D.   On: 06/18/2024 21:01    Pertinent labs & imaging results that were available during my care of the patient were  reviewed by me and considered in my medical decision making (see MDM for details).  Medications Ordered in ED Medications  lidocaine  (LIDODERM ) 5 % 1 patch (1 patch Transdermal Patch Applied 06/18/24 1749)  ketorolac  (TORADOL ) 15 MG/ML injection 15 mg (15 mg Intramuscular Given 06/18/24 1749)  oxyCODONE  (Oxy IR/ROXICODONE ) immediate release tablet 5 mg (5 mg Oral Given 06/18/24 1749)  cyclobenzaprine  (FLEXERIL ) tablet 10 mg (10 mg Oral Given 06/18/24 1748)  acetaminophen  (TYLENOL ) tablet 1,000 mg (1,000 mg Oral Given 06/18/24 1748)  lidocaine  (XYLOCAINE ) 2 % viscous mouth solution 15 mL (15 mLs Mouth/Throat Given 06/18/24 1749)  morphine  (PF) 4 MG/ML injection 4 mg (4 mg Intravenous Given 06/18/24 1919)  ondansetron  (ZOFRAN ) injection 4 mg (4 mg Intravenous Given 06/18/24 1919)  iohexol  (OMNIPAQUE ) 300 MG/ML solution 75 mL (75 mLs Intravenous Contrast Given 06/18/24 2006)                                                                                                                                     Procedures Procedures  (including critical care time)  Medical Decision Making / ED Course    Medical Decision Making:    GRACIE GUPTA is a 63 y.o. female  with past medical history as below, significant for depression, DM, GERD, IBS who presents to the ED with complaint of neck pain. The complaint involves an extensive differential diagnosis and also carries with it a high risk of complications and morbidity.  Serious etiology was considered. Ddx includes but is not limited to: Muscle spasm, viral syndrome, MSK injury, pharyngitis, neoplasm, etc.  Complete initial physical exam performed, notably the patient was in no acute distress, sitting upright on stretcher.    Reviewed and confirmed nursing documentation for past medical history, family history, social history.  Vital signs reviewed.    Neck pain> - Patient has no fever, no meningismus, she is neuro  intact. - Recommended viral swab  and labs, patient refuses, also recommend imaging but patient refuses.  She is adamant that her pain is from the muscles in her neck, she is requesting analgesia - Patient having ongoing pain, she is now amenable to labs and imaging.  Labs imaging obtained and these are stable, she has chronic leukocytosis. - CT imaging of her neck does show some pulmonary nodules but otherwise no acute process.  Recommend follow-up PCP for further surveillance of pulmonary nodules - greatly improved with multimodal pain control.  Etiology of symptoms is likely MSK, further supportive care at home, close follow-up PCP was encouraged      Clinical Course as of 06/18/24 2200  Mon Jun 18, 2024  2113 WBC(!): 16.2 Chronically elev [SG]  2113 Group A Strep by PCR Pt refused [SG]    Clinical Course User Index [SG] Elnor Savant A, DO     10:00 PM: Feeling better, range of motion of her neck is greatly improved, she is tolerating p.o. without difficulty.  No meningismus.  I have discussed the diagnosis/risks/treatment options with the patient and family.  Evaluation and diagnostic testing in the emergency department does not suggest an emergent condition requiring admission or immediate intervention beyond what has been performed at this time.  They will follow up with PCP. We also discussed returning to the ED immediately if new or worsening sx occur. We discussed the sx which are most concerning (e.g., sudden worsening pain, fever, inability to tolerate by mouth, dysphagia, dysphonia, drooling,) that necessitate immediate return.    The patient appears reasonably screen and/or stabilized for discharge and I doubt any other medical condition or other [facility] requiring further screening, evaluation, or treatment in the ED at this time prior to discharge.                 Additional history obtained: -Additional history obtained from family -External records from outside source obtained and reviewed  including: Chart review including previous notes, labs, imaging, consultation notes including  Prior ER evaluation, home medications, PDMP   Lab Tests: -I ordered, reviewed, and interpreted labs.   The pertinent results include:   Labs Reviewed  CBC WITH DIFFERENTIAL/PLATELET - Abnormal; Notable for the following components:      Result Value   WBC 16.2 (*)    Neutro Abs 10.0 (*)    Lymphs Abs 4.4 (*)    Monocytes Absolute 1.6 (*)    All other components within normal limits  BASIC METABOLIC PANEL WITH GFR - Abnormal; Notable for the following components:   Creatinine, Ser 1.18 (*)    GFR, Estimated 52 (*)    All other components within normal limits  GROUP A STREP BY PCR    Notable for as above, chronic leukocytosis  EKG   EKG Interpretation Date/Time:    Ventricular Rate:    PR Interval:    QRS Duration:    QT Interval:    QTC Calculation:   R Axis:      Text Interpretation:           Imaging Studies ordered: I ordered imaging studies including CT soft tissue neck, CT C-spine I independently visualized the following imaging with scope of interpretation limited to determining acute life threatening conditions related to emergency care; findings noted above I agree with the radiologist interpretation If any imaging was obtained with contrast I closely monitored patient for any possible adverse reaction a/w contrast administration in the emergency department   Medicines  ordered and prescription drug management: Meds ordered this encounter  Medications   ketorolac  (TORADOL ) 15 MG/ML injection 15 mg   lidocaine  (LIDODERM ) 5 % 1 patch   oxyCODONE  (Oxy IR/ROXICODONE ) immediate release tablet 5 mg    Refill:  0   cyclobenzaprine  (FLEXERIL ) tablet 10 mg   acetaminophen  (TYLENOL ) tablet 1,000 mg   lidocaine  (XYLOCAINE ) 2 % viscous mouth solution 15 mL   morphine  (PF) 4 MG/ML injection 4 mg   ondansetron  (ZOFRAN ) injection 4 mg   iohexol  (OMNIPAQUE ) 300 MG/ML  solution 75 mL    -I have reviewed the patients home medicines and have made adjustments as needed   Consultations Obtained: Not applicable  Cardiac Monitoring: Continuous pulse oximetry interpreted by myself, ra % on RA.    Social Determinants of Health:  Diagnosis or treatment significantly limited by social determinants of health: current smoker   Reevaluation: After the interventions noted above, I reevaluated the patient and found that they have improved  Co morbidities that complicate the patient evaluation  Past Medical History:  Diagnosis Date   Acute blood loss anemia 02/16/2012   Allergy    Arthritis    Phreesia 10/14/2020   Blood transfusion without reported diagnosis    Phreesia 10/14/2020   Breast cancer (HCC) 2007   History of  XRT, onTamoxifen   Bruises easily    Cancer (HCC)    Phreesia 10/14/2020   Depression    Depression    Phreesia 10/14/2020   Diabetes mellitus    prediabetic   Diabetes mellitus without complication (HCC)    [employer] 10/14/2020   GERD (gastroesophageal reflux disease)    Hypertriglyceridemia    Hypothyroidism    following chemo amnd radiaition for breast cancer, needed replacement short term   Hypothyroidism (acquired)    replaced x 1 year   IBS (irritable bowel syndrome)    Menieres disease    Controlled with triamterene     Migraines    Obesity    Palpitations    Personal history of radiation therapy    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HCC)    Sleep apnea 2010   Problems with CPAP      Dispostion: Disposition decision including need for hospitalization was considered, and patient discharged from emergency department.    Final Clinical Impression(s) / ED Diagnoses Final diagnoses:  Acute strain of neck muscle, initial encounter  Torticollis, acute        Elnor Jayson LABOR, DO 06/18/24 2200

## 2024-06-18 NOTE — Discharge Instructions (Addendum)
 It was a pleasure caring for you today in the emergency department.  Your symptoms a likely secondary to muscle spasm in your neck. Please follow up with your PCP.   Please return to the emergency department for any worsening or worrisome symptoms.

## 2024-06-18 NOTE — Telephone Encounter (Signed)
 Noted

## 2024-06-18 NOTE — Telephone Encounter (Signed)
 FYI Only or Action Required?: FYI only for provider.  Patient was last seen in primary care on 05/04/2024 by Annette Rollene BRAVO, MD.  Called Nurse Triage reporting No chief complaint on file..  Symptoms began several days ago.  Interventions attempted: OTC medications: ibuprofen .  Symptoms are: rapidly worsening.  Triage Disposition: Go to ED Now (Notify PCP)  Patient/caregiver understands and will follow disposition?: Yes    Copied from CRM [id number]. Topic: Clinical - Red Word Triage >> Jun 18, 2024  9:58 AM Annette [name] wrote: Red Word that prompted transfer to Nurse Triage: Neck pain has spread to her collarbone, and cannot turn her neck, and headache Reason for Disposition  [1] Stiff neck (can't put chin to chest) AND [2] headache  Answer Assessment - Initial Assessment Questions 1. ONSET: When did the pain begin?      Two days ago 2. LOCATION: Where does it hurt?      neck 3. PATTERN Does the pain come and go, or has it been constant since it started?      constant 4. SEVERITY: How bad is the pain?  (Scale 0-10; or none or slight stiffness, mild, moderate, severe)     severe 5. RADIATION: Does the pain go anywhere else, shoot into your arms?     denies 6. CORD SYMPTOMS: Any weakness or numbness of the arms or legs?     denies 7. CAUSE: What do you think is causing the neck pain?     uknown 8. NECK OVERUSE: Any recent activities that involved turning or twisting the neck?     no 9. OTHER SYMPTOMS: Do you have any other symptoms? (e.g., headache, fever, chest pain, difficulty breathing, neck swelling)     headache 10. PREGNANCY: Is there any chance you are pregnant? When was your last menstrual period?       na  Protocols used: Neck Pain or Stiffness-A-AH

## 2024-06-18 NOTE — ED Notes (Signed)
 Pt refused strep swab

## 2024-06-18 NOTE — ED Triage Notes (Signed)
 Pt c/o of neck pain x3 days and today has started to have pain when she swallows.

## 2024-06-19 ENCOUNTER — Telehealth: Payer: Self-pay | Admitting: Family Medicine

## 2024-06-19 ENCOUNTER — Other Ambulatory Visit: Payer: Self-pay | Admitting: Internal Medicine

## 2024-06-19 DIAGNOSIS — M47812 Spondylosis without myelopathy or radiculopathy, cervical region: Secondary | ICD-10-CM

## 2024-06-19 DIAGNOSIS — M51362 Other intervertebral disc degeneration, lumbar region with discogenic back pain and lower extremity pain: Secondary | ICD-10-CM

## 2024-06-19 NOTE — Telephone Encounter (Signed)
 Patient called back very upset needs an appointment with pain management Dr Twila [address] [place] .  She said already spoken to Dr Antonetta about this referral she does not need no appointment.  Please contact patient [phone].

## 2024-06-19 NOTE — Telephone Encounter (Signed)
 Called patient lvm to call back to schedule an appt

## 2024-06-19 NOTE — Telephone Encounter (Signed)
 Copied from CRM [id number]. Topic: Referral - Request for Referral >> Jun 19, 2024  1:33 PM Debby BROCKS wrote: Did the patient discuss referral with their provider in the last year? Yes (If No - schedule appointment) (If Yes - send message)  Appointment offered? No, Patient was triaged on 06/18/2024 but she states she did not get much help. She would like a referral to see Dr. Rico as soon as possible  Type of order/referral and detailed reason for visit: Pain management specialist for neck pain  Preference of office, provider, location: Dr. Rico for Pain management  If referral order, have you been seen by this specialty before? No (If Yes, this issue or another issue? When? Where?  Can we respond through MyChart? No, phone call

## 2024-07-03 ENCOUNTER — Other Ambulatory Visit: Payer: Self-pay | Admitting: Family Medicine

## 2024-07-03 ENCOUNTER — Ambulatory Visit (HOSPITAL_COMMUNITY)
Admission: RE | Admit: 2024-07-03 | Discharge: 2024-07-03 | Disposition: A | Discharge status: Home / Self Care | Source: Ambulatory Visit | Attending: Hematology | Admitting: Hematology

## 2024-07-03 DIAGNOSIS — Z87891 Personal history of nicotine dependence: Secondary | ICD-10-CM | POA: Insufficient documentation

## 2024-07-03 DIAGNOSIS — Z122 Encounter for screening for malignant neoplasm of respiratory organs: Secondary | ICD-10-CM | POA: Insufficient documentation

## 2024-07-03 DIAGNOSIS — E7800 Pure hypercholesterolemia, unspecified: Secondary | ICD-10-CM

## 2024-07-09 NOTE — Progress Notes (Signed)
 Attempted to notify patient of LDCT Lung Cancer Screening Results via telephone with the recommendation to follow-up in 6 months. Unable to reach patient directly, detailed VM left asking that the patient return my call. Results are as follows: IMPRESSION: 1. Two new pulmonary nodules in the left upper lobe, measuring up to 5.4 mm. Lung-RADS 3, probably benign findings. Short-term follow-up in 6 months is recommended with repeat low-dose chest CT without contrast (please use the following order, CT CHEST LCS NODULE FOLLOW-UP W/O CM). These results will be called to the ordering clinician or representative by the Radiologist Assistant, and communication documented in the PACS or [employer]. Adrenal adenomas 2. Right adrenal adenomas. 3. Left adrenalectomy and splenectomy. 4.  Emphysema (ICD10-J43.9).

## 2024-07-09 NOTE — Progress Notes (Signed)
 Patient notified of LDCT Lung Cancer Screening Results via telephone with the recommendation to follow-up in 6 months. Results are as follows:   IMPRESSION: 1. Two new pulmonary nodules in the left upper lobe, measuring up to 5.4 mm. Lung-RADS 3, probably benign findings. Short-term follow-up in 6 months is recommended with repeat low-dose chest CT without contrast (please use the following order, CT CHEST LCS NODULE FOLLOW-UP W/O CM). These results will be called to the ordering clinician or representative by the Radiologist Assistant, and communication documented in the PACS or [employer]. Adrenal adenomas 2. Right adrenal adenomas. 3. Left adrenalectomy and splenectomy. 4.  Emphysema (ICD10-J43.9).

## 2024-07-17 ENCOUNTER — Ambulatory Visit: Admitting: Internal Medicine

## 2024-07-17 ENCOUNTER — Other Ambulatory Visit

## 2024-07-17 ENCOUNTER — Encounter: Payer: Self-pay | Admitting: Internal Medicine

## 2024-07-17 VITALS — BP 112/60 | HR 87 | Ht 66.0 in | Wt 187.0 lb

## 2024-07-17 DIAGNOSIS — Z794 Long term (current) use of insulin: Secondary | ICD-10-CM | POA: Diagnosis not present

## 2024-07-17 DIAGNOSIS — E782 Mixed hyperlipidemia: Secondary | ICD-10-CM

## 2024-07-17 DIAGNOSIS — E278 Other specified disorders of adrenal gland: Secondary | ICD-10-CM

## 2024-07-17 DIAGNOSIS — E1165 Type 2 diabetes mellitus with hyperglycemia: Secondary | ICD-10-CM | POA: Diagnosis not present

## 2024-07-17 LAB — POCT GLYCOSYLATED HEMOGLOBIN (HGB A1C): Hemoglobin A1C: 6.7 % — AB (ref 4.0–5.6)

## 2024-07-17 MED ORDER — GLIPIZIDE ER 2.5 MG PO TB24: 2.5000 mg | ORAL_TABLET | Freq: Every day | ORAL | 3 refills | Status: AC

## 2024-07-17 NOTE — Addendum Note (Signed)
 Addended by: CLEOTILDE ROLIN RAMAN on: 07/17/2024 04:17 PM   Modules accepted: Orders

## 2024-07-17 NOTE — Patient Instructions (Addendum)
 Please continue: - Tresiba  18 units daily  Change Glipizide  to: - Glipizide  ER 2.5 mg before b'fast   Try to stop the caramel macchiato.   Please return in 4 months.

## 2024-07-17 NOTE — Progress Notes (Signed)
 Patient ID: Annette [name], female   DOB: 1961/09/07, 63 y.o.   MRN: [id number]   HPI: Annette [name] is a 63 y.o.-year-old female, initially referred by her PCP, Dr. Antonetta, returning for follow-up for DM2, dx in 2018, insulin -dependent since 03/2019, uncontrolled, without long-term complications.  She previously saw Dr. Nida and Dr. Balan but then switched to seeing me.  Last visit 4 months ago.   She is here with her husband.  Interim history: She continues to have some nausea despite stopping Trulicity  before last visit.  She feels that this may be related to glipizide . No blurry vision, increased urination, chest pain.  She has neck pain - on pain medication: NSAIDs, Flexeril , narcotics (now off). No steroids.  Reviewed HbA1c levels: Lab Results  Component Value Date   HGBA1C 6.2 (A) 03/13/2024   HGBA1C 6.1 (A) 10/20/2023   HGBA1C 6.5 (A) 06/20/2023   HGBA1C 6.8 (A) 02/17/2023   HGBA1C 7.4 (A) 10/19/2022   HGBA1C 7.1 (A) 06/14/2022   HGBA1C 6.9 (A) 02/08/2022   HGBA1C 6.7 (H) 01/26/2022   HGBA1C 5.9 (A) 05/19/2021   HGBA1C 6.0 (H) 01/17/2021  06/14/2022: HbA1c calculated from fructosamine normal, at 6.27%, correlating better with blood sugar checks at home.  Previously on: - Glipizide  XL 20 mg before breakfast - Lantus  50 units at bedtime (dose increased 04/08/2020) She could not tolerate Metformin  due to GI intolerance - abdominal pain and diarrhea. She had vaginal yeast infections and UTIs from [facility].  Currently on: - Januvia  100 mg >> Glipizide  5 mg before breakfast-started 03/2024 - Tresiba  50 >> ... 26 >> 20 >> 24 >> 20 >> 18 units daily We stopped glipizide  XL 01/2021. She previously started Trulicity  04/2020, but stopped by PCP due to nausea and vomiting 01/2024  Pt checks her sugars >4x a day:  Previously:  Prev.:   Lowest sugar was 43 (am) ...>> 59 >> 50s >> 54; she has hypoglycemia awareness in the 90s.  Highest sugar was 425 ... >> 200s >> 300  ([place]).  Glucometer: One Touch verio  She is again only eating 1 meal a day per husband's report She only drinks unsweetened tea and coffee. She eats a Healthy Choice for lunch and occasionally dinner.  -She has no history of CKD, last BUN/creatinine:  Lab Results  Component Value Date   BUN 19 06/18/2024   BUN 14 05/31/2024   CREATININE 1.18 (H) 06/18/2024   CREATININE 0.84 05/31/2024   Lab Results  Component Value Date   MICRALBCREAT 14 10/20/2023   MICRALBCREAT 18 09/14/2022   MICRALBCREAT <8 04/23/2021   MICRALBCREAT <6.0 09/16/2017   MICRALBCREAT 2.1 11/24/2011  Not on ACE inhibitor/ARB.  + HL; last set of lipids: Lab Results  Component Value Date   CHOL 131 05/04/2024   HDL 49 05/04/2024   LDLCALC 68 05/04/2024   TRIG 70 05/04/2024   CHOLHDL 2.7 05/04/2024  On Crestor  20. She was also on Zetia  but she did not feel well on it >> stopped.  - last eye exam was in 2025: No DR reportedly.  At last visit she described occasional double vision >> glasses were too strong.  I referred her to [facility].  - no numbness but some pain and tingling in her right foot -after her back surgery.  Last foot exam 03/13/2024.  Pt has FH of DM in mother, brother, sister.  She also has a history of adrenal adenomas: - she had previous adrenalectomy by Dr. 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   Received labeled left adrenal gland is a 30 g adrenal gland  which is received transected by the surgeon which, when  reapproximated has an overall measurement of 8.5 x 4.5 x 2.8 cm.  The cut surface reveals a central well-circumscribed, yellow  cortical nodule measuring 4 x 3.5 x 2.8 cm. The nodule is focally  compressing the adrenal gland. Within the surrounding soft tissue  there is a 0.3 cm yellow nodule which has a similar appearance to  the  cortical nodule representative sections are submitted as  follows:   -  she was found to have 2 adrenal adenomas, which is investigated by oncology.  They had negative Hounsfield units on CT, consistent with benign nodules.  Hormonal investigation was negative: Component     Latest Ref Rng 09/07/2021 09/09/2021 11/02/2021  Epinephrine , Rand Ur     Undefined ug/L  1    Epinephrine , U, 24Hr     0 - 20 ug/24 hr  2    Norepinephrine, Rand Ur     Undefined ug/L  19    Norepinephrine,U,24H     0 - 135 ug/24 hr  29    Dopamine, Rand Ur     Undefined ug/L  104    Dopamine, Ur, 24Hr     0 - 510 ug/24 hr  156    Total Volume  1,500    Total Volume  1,500    Normetanephrine, Ur     Undefined ug/L  142    Normetanephr.,U,24h     131 - 612 ug/24 hr  213    Metanephrine, Ur     Undefined ug/L  26    Metanephrines, 24H Ur     36 - 209 ug/24 hr  39    Norepinephrine     0 - 874 pg/mL 745     Epinephrine      0 - 62 pg/mL 19     Dopamine     0 - 48 pg/mL 40     ALDOSTERONE      ng/dL   27   Renin Activity     0.25 - 5.82 ng/mL/h   13.41 (H)   ALDO / PRA Ratio     0.9 - 28.9 Ratio   2.0   Normetanephrine, Pl     0.0 - 285.2 pg/mL 106.9     Metanephrine, Pl     0.0 - 88.0 pg/mL 10.9     C206 ACTH      6 - 50 pg/mL 4.2 (L)   23   Cortisol, Plasma     ug/dL 6.6   55.6   Cortisol, Plasma        37.2   Cortisol, Plasma        24.4   Potassium     3.5 - 5.1 mEq/L   3.8    Dexamethasone  suppression test was normal: Component     Latest Ref Rng 12/03/2021  Dexamethasone , Serum     ng/dL 567   Cortisol - AM     mcg/dL 2.6 (L)     Previous investigation was also negative: Component     Latest Ref Rng 08/17/2011  Total Volume - CF 24Hr U     mL 1300   Epinephrine , 24 hr Urine     2 - 24 mcg/24 h  4   Norepinephrine, 24 hr Ur     15 - 100 mcg/24 h 39   Calculated Total (E+NE)     26 - 121 mcg/24 h 43   Dopamine, 24 hr Urine     52 - 480 mcg/24 h 139   Creatinine, Urine  mg/day-CATEUR     0.63 - 2.50 g/24 h 1.34   Normetanephrine, Ur     Undefined ug/L   Normetanephr.,U,24h     131 - 612 ug/24 hr 318   Metanephrine, Ur     Undefined ug/L 422   Metanephrines, 24H Ur     36 - 209 ug/24 hr   Metanephrines, Ur     90 - 315 mcg/24 h 104    Component     Latest Ref Rng 08/17/2011  Cortisol, Plasma     ug/dL 85.3   Aldosterone, Serum      ng/dL 23    88/86/7987: Component Ref Range & Units 10 yr ago  Total Volume - CF 24Hr U mL 1300   Epinephrine , 24 hr Urine 2 - 24 mcg/24 h 4   Norepinephrine, 24 hr Ur 15 - 100 mcg/24 h 39   Calculated Total (E+NE) 26 - 121 mcg/24 h 43   Dopamine, 24 hr Urine 52 - 480 mcg/24 h 139   Creatinine, Urine mg/day-CATEUR 0.63 - 2.50 g/24 h 1.34   Resulting Agency  [facility]   Component Ref Range & Units 10 yr ago  Metanephrines, Ur 90 - 315 mcg/24 h 104   Comment: This specimen was submitted with a pH greater than 6.0. Optimum pH for this assay is 1.0-6.0. Improper preservation may compromise the validity of the assay.  Normetanephrine, 24H Ur 122 - 676 mcg/24 h 318   Metaneph Total, Ur 224 - 832 mcg/24 h 422   Comment: A four fold elevation of urinary normetanephrines is extremely likely to be due to a tumor, while a four fold elevation of urinary metanephrines is highly suggestive, but not diagnostic of the tumor. Measurement of plasma Metanephrines and Chromogranin A is recommended for confirmation.  Resulting Agency  [facility]   12/22/2009: Cortisol,F,ug/L,U Undefined ug/L 20   Cortisol, 24H Ur 0-50 ug/24 hr 50    She also has a history of HTN, OSA. She had COVID-19 10/2019. She recovered well afterwards.  She also has a history of partial thyroidectomy. Reviewed latest TSH: Lab Results  Component Value Date   TSH 2.600 05/04/2024   She had back surgery (12/2020).  She developed significant leg pain afterwards which slowly improved.  ROS: + See HPI  I reviewed pt's medications, allergies, PMH, social  hx, family hx, and changes were documented in the history of present illness. Otherwise, unchanged from my initial visit note.  Past Medical History:  Diagnosis Date   Acute blood loss anemia 02/16/2012   Allergy    Arthritis    Phreesia 10/14/2020   Blood transfusion without reported diagnosis    Phreesia 10/14/2020   Breast cancer (HCC) 2007   History of  XRT, onTamoxifen   Bruises easily    Cancer (HCC)    Phreesia 10/14/2020   Depression    Depression    Phreesia 10/14/2020   Diabetes mellitus    prediabetic   Diabetes mellitus without complication (HCC)    Phreesia 10/14/2020   GERD (gastroesophageal reflux disease)    Hypertriglyceridemia    Hypothyroidism    following chemo amnd radiaition for breast cancer, needed replacement short term  Hypothyroidism (acquired)    replaced x 1 year   IBS (irritable bowel syndrome)    Menieres disease    Controlled with triamterene     Migraines    Obesity    Palpitations    Personal history of radiation therapy    Pituitary insufficiency    Sleep apnea 2010   Problems with CPAP   Past Surgical History:  Procedure Laterality Date   ABDOMINAL EXPOSURE N/A 12/03/2020   Procedure: ABDOMINAL EXPOSURE;  Surgeon: Oris Krystal FALCON, MD;  Location: [facility] OR;  Service: Vascular;  Laterality: N/A;   ABDOMINAL HYSTERECTOMY  1998   Benign, fibroids   ADRENALECTOMY  02/16/2012   [facility], splemic trauma, 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   ANTERIOR LUMBAR FUSION N/A 12/03/2020   Procedure: Lumbar Four-Five Lumbar Five Sacral One Anterior lumbar interbody fusion;  Surgeon: Gillie Duncans, MD;  Location: [facility] OR;  Service: Neurosurgery;  Laterality: N/A;   APPENDECTOMY  06/12/2008   BIOPSY  07/02/2020   Procedure: BIOPSY;  Surgeon: Eartha Angelia Sieving, MD;  Location: [facility];  Service: Gastroenterology;;   BIOPSY  01/22/2021   Procedure: BIOPSY;  Surgeon: Shaaron Lamar HERO, MD;  Location: [facility];  Service: Endoscopy;;   BIOPSY  02/17/2022    Procedure: BIOPSY;  Surgeon: Golda Claudis PENNER, MD;  [facility]: [facility];  Service: Endoscopy;;   BREAST BIOPSY Left 02/21/2024   US  LT BREAST BX W LOC DEV 1ST LESION IMG BX SPEC US  GUIDE 02/21/2024 [facility]-UL[facility]   BREAST LUMPECTOMY Left 2008   BREAST RECONSTRUCTION     Left reconstructive   BREAST SURGERY     [facility] - right side   CHOLECYSTECTOMY  1985   COLONOSCOPY WITH PROPOFOL  N/A 07/02/2020   Procedure: COLONOSCOPY WITH PROPOFOL ;  Surgeon: Eartha Angelia Sieving, MD;  [facility];  Service: Gastroenterology;  Laterality: N/A;  1045   COLONOSCOPY WITH PROPOFOL  N/A 04/01/2021   Procedure: COLONOSCOPY WITH PROPOFOL ;  Surgeon: Eartha Angelia Sieving, MD;  [facility];  Service: Gastroenterology;  Laterality: N/A;  12:50   ESOPHAGEAL DILATION  12/19/2015   Procedure: ESOPHAGEAL DILATION;  Surgeon: Claudis PENNER Golda, MD;  [facility];  Service: Endoscopy;;   ESOPHAGOGASTRODUODENOSCOPY N/A 12/19/2015   Procedure: ESOPHAGOGASTRODUODENOSCOPY (EGD);  Surgeon: Claudis PENNER Golda, MD;  [facility];  Service: Endoscopy;  Laterality: N/A;  11:40   ESOPHAGOGASTRODUODENOSCOPY N/A 02/28/2024   Procedure: EGD (ESOPHAGOGASTRODUODENOSCOPY);  Surgeon: Cinderella Deatrice FALCON, MD;  [facility];  Service: Endoscopy;  Laterality: N/A;  11:30AM;ASA 2   ESOPHAGOGASTRODUODENOSCOPY (EGD) WITH PROPOFOL  N/A 02/17/2022   Procedure: ESOPHAGOGASTRODUODENOSCOPY (EGD) WITH PROPOFOL ;  Surgeon: Golda Claudis PENNER, MD;  [facility];  Service: Endoscopy;  Laterality: N/A;  1045   FLEXIBLE SIGMOIDOSCOPY N/A 01/22/2021   Procedure: FLEXIBLE SIGMOIDOSCOPY;  Surgeon: Shaaron Lamar HERO, MD;  [facility];  Service: Endoscopy;  Laterality: N/A;   HEMOSTASIS CLIP PLACEMENT  04/01/2021   Procedure: HEMOSTASIS CLIP PLACEMENT;  Surgeon: Eartha Angelia Sieving, MD;  [facility];  Service: Gastroenterology;;  x2   POLYPECTOMY  07/02/2020    Procedure: POLYPECTOMY;  Surgeon: Eartha Angelia Sieving, MD;  [facility];  Service: Gastroenterology;;   POLYPECTOMY  04/01/2021   Procedure: POLYPECTOMY INTESTINAL;  Surgeon: Eartha Angelia Sieving, MD;  [facility];  Service: Gastroenterology;;   SPINE SURGERY     SPLENECTOMY, 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   THYROIDECTOMY, PARTIAL  11/05/2010   Benign disease   TUBAL LIGATION     Social History   Socioeconomic History   Marital status: Married    Spouse name: Merilee   Number of children: 2   Years of education: Not on file   Highest education level: GED or equivalent  Occupational History   Not on file  Tobacco Use   Smoking status: Every Day    Current packs/day: 0.50    Average packs/day: 0.5 packs/day for 40.0 years (20.0 ttl pk-yrs)    Types: Cigarettes   Smokeless tobacco: Never   Tobacco comments:    1/2 pack a day    02/15/2023 Patient smokes about 1/2 pack daily  Vaping Use   Vaping status: Never Used  Substance and Sexual Activity   Alcohol use: No    Comment: Encouraged to quit smoking. She has tried the nicotone gum and patches but did not help   Drug use: No   Sexual activity: Not Currently    Birth control/protection: Surgical  Other Topics Concern   Not on file  Social History Narrative   Not on file   Social Drivers of Health   Financial Resource Strain: Medium Risk (05/02/2024)   Overall Financial Resource Strain (CARDIA)    Difficulty of Paying Living Expenses: Somewhat hard  Food Insecurity: Food Insecurity Present (05/02/2024)   Hunger Vital Sign    Worried About Running Out of Food in the Last Year: Sometimes true    Ran Out of Food in the Last Year: Sometimes true  Transportation Needs: No Transportation Needs (05/02/2024)   PRAPARE - Administrator, Civil Service (Medical): No    Lack of Transportation (Non-Medical): No  Physical Activity: Insufficiently  Active (05/02/2024)   Exercise Vital Sign    Days of Exercise per Week: 3 days    Minutes of Exercise per Session: 30 min  Stress: Stress Concern Present (05/02/2024)   [employer] of Occupational Health - Occupational Stress Questionnaire    Feeling of Stress: To some extent  Social Connections: Socially Integrated (05/02/2024)   Social Connection and Isolation Panel    Frequency of Communication with Friends and Family: More than three times a week    Frequency of Social Gatherings with Friends and Family: More than three times a week    Attends Religious Services: 1 to 4 times per year    Active Member of [employer] or Organizations: Yes    Attends Banker Meetings: 1 to 4 times per year    Marital Status: Married  Catering manager Violence: Not At Risk (01/10/2024)   Humiliation, Afraid, Rape, and Kick questionnaire    Fear of Current or Ex-Partner: No    Emotionally Abused: No    Physically Abused: No    Sexually Abused: No   Current Outpatient Medications on File Prior to Visit  Medication Sig Dispense Refill   acetaminophen  (TYLENOL ) 325 MG tablet Take 2 tablets (650 mg total) by mouth every 6 (six) hours as needed. 36 tablet 0   albuterol  (VENTOLIN  HFA) 108 (90 Base) MCG/ACT inhaler TAKE 2 PUFFS BY MOUTH EVERY 6 HOURS AS NEEDED FOR WHEEZE OR SHORTNESS OF BREATH     butalbital -acetaminophen -caffeine  (FIORICET) 50-325-40 MG tablet Take one tablet by mouth every 8  to 12 hours , as needed, for uncontrolled headache 20 tablet 3   Continuous Glucose Receiver (FREESTYLE LIBRE 3 READER) DEVI USE TO CHECK GLUCOSE CONTINUOUSLY. 1 each 1   Continuous Glucose Sensor (FREESTYLE LIBRE  3 PLUS SENSOR) MISC Inject 1 Device into the skin continuous. Change every 15 days 6 each 3   fluticasone  (FLONASE ) 50 MCG/ACT nasal spray SPRAY 2 SPRAYS INTO EACH NOSTRIL EVERY DAY (Patient taking differently: as needed.) 48 mL 2   glipiZIDE  (GLUCOTROL ) 5 MG tablet Take 1 tablet (5 mg total) by mouth  daily before supper. 90 tablet 1   ibuprofen  (ADVIL ) 600 MG tablet Take 1 tablet (600 mg total) by mouth every 6 (six) hours as needed. 30 tablet 0   insulin  degludec (TRESIBA  FLEXTOUCH) 200 UNIT/ML FlexTouch Pen Inject 18 Units into the skin daily. 6 mL 1   Insulin  Pen Needle (BD PEN NEEDLE NANO 2ND GEN) 32G X 4 MM MISC USE 4X DAILY AS DIRECTED 400 each 1   Lancets (ONETOUCH DELICA PLUS LANCET33G) MISC USE TO TEST 4 TIMES A DAY 100 each 7   lidocaine  (LIDODERM ) 5 % Place 1 patch onto the skin daily as needed. Remove & Discard patch within 12 hours or as directed by MD 15 patch 0   LORazepam  (ATIVAN ) 1 MG tablet Take one tablet by mouth once daily, as needed, for uncontrolled nausea 30 tablet 1   omeprazole  (PRILOSEC) 40 MG capsule TAKE 1 CAPSULE DAILY 90 capsule 1   ONETOUCH VERIO test strip USE AS DIRECTED 2 TIMES DAILY 200 strip 3   oxyCODONE  (ROXICODONE ) 5 MG immediate release tablet Take 1 tablet (5 mg total) by mouth every 4 (four) hours as needed for severe pain (pain score 7-10). 10 tablet 0   promethazine  (PHENERGAN ) 12.5 MG tablet Take 1 tablet (12.5 mg total) by mouth every 8 (eight) hours as needed for nausea or vomiting. 30 tablet 1   rosuvastatin  (CRESTOR ) 20 MG tablet TAKE 1 TABLET DAILY 90 tablet 1   temazepam  (RESTORIL ) 15 MG capsule Take 1 capsule (15 mg total) by mouth at bedtime. 90 capsule 1   traZODone  (DESYREL ) 100 MG tablet TAKE 1 TABLET BY MOUTH EVERYDAY AT BEDTIME 90 tablet 1   triamterene -hydrochlorothiazide  (DYAZIDE ) 37.5-25 MG capsule TAKE 1 CAPSULE EVERY       MORNING 90 capsule 1   UNABLE TO FIND CPAP tubing and supplies, face mask Dx: G47.30 1 each 0   venlafaxine  XR (EFFEXOR -XR) 75 MG 24 hr capsule TAKE 1 CAPSULE DAILY 90 capsule 1   No current facility-administered medications on file prior to visit.   Allergies  Allergen Reactions   Penicillins Swelling    Has patient had a PCN reaction causing immediate rash, facial/tongue/throat swelling, SOB or  lightheadedness with hypotension: Yes Has patient had a PCN reaction causing severe rash involving mucus membranes or skin necrosis: No Has patient had a PCN reaction that required hospitalization: No  Has patient had a PCN reaction occurring within the last 10 years: No If all of the above answers are NO, then may proceed with Cephalosporin use.   Of face and tongue.   Metformin  And Related Diarrhea and Nausea Only    Pt to discontinue med due to intolerance, cramps and diarrhea   Wound Dressing Adhesive Itching and Rash    Dermabond and other surgical glues for incisions   Jardiance [Empagliflozin] Other (See Comments)    Yeast infection     Statins Other (See Comments)    Fell bad but on Crestor    Zetia  [email or web address] ] Other (See Comments)    Feels funny   Family History  Problem Relation Age of Onset   Diabetes Mother    Arrhythmia Mother  Varicose Veins Mother    Heart disease Father    Hyperlipidemia Father    Arrhythmia Father    Depression Father    Diabetes Sister    Breast cancer Paternal Grandmother    Cancer Paternal Grandmother        Breast   Diabetes Brother    Heart disease Brother    PE: BP 112/60   Pulse 87   Ht 5' 6 (1.676 m)   Wt 187 lb (84.8 kg)   SpO2 97%   BMI 30.18 kg/m  Wt Readings from Last 3 Encounters:  07/17/24 187 lb (84.8 kg)  06/18/24 185 lb 3 oz (84 kg)  05/31/24 186 lb 4.6 oz (84.5 kg)   Constitutional: overweight, in NAD Eyes: EOMI, no exophthalmos ENT: no thyromegaly, no cervical lymphadenopathy Cardiovascular: RRR, No MRG Respiratory: CTA B Musculoskeletal: no deformities Skin:  no rashes Neurological: no tremor with outstretched hands  ASSESSMENT: 1. DM2, insulin -dependent, uncontrolled, without long-term complications, but with hyperglycemia  No family history of medullary thyroid  cancer or personal history of pancreatitis.  2. HL  3.  Adrenal adenoma  PLAN:  1. Patient with longstanding, previously  uncontrolled type 2 diabetes, on injectable antidiabetic regimen with long-acting insulin  only and previously on GLP-1 receptor agonist, which she had to stop due to nausea and vomiting before last visit.  HbA1c at last visit was slightly higher, at 6.2%.  Sugars appears to increase significantly after breakfast and then more so after dinner, with a peak around 12 AM.  Sugars were dropping afterwards with a nadir around 8 to 9 AM.  I advised her to decrease the Tresiba  dose, to stop her Caramel Macchiato coffee at night and we discussed about possibly trying a DPP 4 inhibitor versus a low-dose glipizide . CGM interpretation: -At today's visit, we reviewed her CGM downloads: It appears that 88% of values are in target range (goal >70%), while 10% are higher than 180 (goal <25%), and 2% are lower than 70 (goal <4%).  The calculated average blood sugar is 134.  The projected HbA1c for the next 3 months (GMI) is 6.5%. -Reviewing the CGM trends, sugars appear to be more fluctuating in the last 2 weeks compared to the previous weeks and she actually had wonderful blood sugars over the summer, reviewing further records.  She feels that her blood sugars are worse due to her neck pain.  Fortunately, she did not need steroids for this.  She does have occasional lower blood sugars sometimes in the second half of the night and more consistently before dinner.  She also describes nausea which she feels it may be related to glipizide .  We discussed about decreasing the dose of glipizide  and switching to an extended release formulation.  Unfortunately, Januvia  was very expensive for her. -She did not stop the Caramel Macchiato coffee since last visit so I again advised her to try to do so. - I suggested to:  Patient Instructions  Please continue: - Tresiba  18 units daily  Change Glipizide  to: - Glipizide  ER 2.5 mg before b'fast   Try to stop the caramel macchiato.   Please return in 4 months.   - we checked her  HbA1c: 6.7% (higher) - advised to check sugars at different times of the day - 4x a day, rotating check times - advised for yearly eye exams >> she is UTD - will check an ACR today - return to clinic in 4 months  2. HL - Latest lipid panel showed fractions  at goal: Lab Results  Component Value Date   CHOL 131 05/04/2024   HDL 49 05/04/2024   LDLCALC 68 05/04/2024   TRIG 70 05/04/2024   CHOLHDL 2.7 05/04/2024  -She continues Crestor  20 mg daily without side effects  3.  Adrenal adenoma - Nonfunctional -adrenal hormone levels were repeatedly normal -Reviewed previous adrenal pathology from her surgery with Dr. Marolyn: Benign adenomas.  This is likely a bilateral process for her. -Adrenal hormone testing from 2011 on was reviewed and this was normal -Latest investigation was in 2023 when we checked aldosterone, cortisol, plasma metanephrines and catecholamines and they were all normal.  A dexamethasone  suppression test was also normal.  - Will continue to follow her without intervention for now  Lela Fendt, MD PhD [facility]

## 2024-07-18 ENCOUNTER — Encounter (INDEPENDENT_AMBULATORY_CARE_PROVIDER_SITE_OTHER): Payer: Self-pay | Admitting: Gastroenterology

## 2024-07-18 ENCOUNTER — Ambulatory Visit: Payer: Self-pay | Admitting: Internal Medicine

## 2024-07-18 LAB — MICROALBUMIN / CREATININE URINE RATIO
Creatinine, Urine: 83 mg/dL (ref 20–275)
Microalb Creat Ratio: 11 mg/g{creat} (ref <30)
Microalb, Ur: 0.9 mg/dL

## 2024-08-09 ENCOUNTER — Ambulatory Visit (INDEPENDENT_AMBULATORY_CARE_PROVIDER_SITE_OTHER): Admitting: Gastroenterology

## 2024-08-09 ENCOUNTER — Encounter (INDEPENDENT_AMBULATORY_CARE_PROVIDER_SITE_OTHER): Payer: Self-pay | Admitting: Gastroenterology

## 2024-08-09 VITALS — BP 112/60 | HR 120 | Temp 97.9°F | Ht 66.0 in | Wt 187.1 lb

## 2024-08-09 DIAGNOSIS — R112 Nausea with vomiting, unspecified: Secondary | ICD-10-CM

## 2024-08-09 MED ORDER — PROMETHAZINE HCL 12.5 MG PO TABS: 12.5000 mg | ORAL_TABLET | Freq: Three times a day (TID) | ORAL | 1 refills | Status: AC | PRN

## 2024-08-09 NOTE — Progress Notes (Addendum)
 Referring Provider: Antonetta Rollene BRAVO, MD Primary Care Physician:  Antonetta Rollene BRAVO, MD Primary GI Physician: Dr. Eartha   Chief Complaint  Patient presents with   Follow-up    Patient here today for a follow up on nausea and vomiting. Patient says she is still having issues daily. She is taking promethazine  12.5 mg prn.    HPI:   Annette [name] is a 63 y.o. female with past medical history of depression, diabetes, GERD, HLD, Hypothyroidism, IBS-D, OSA, breast cancer in remission   Patient presenting today for:  Follow up of nausea and vomiting  Last seen in August, at that time nausea some better. Taking omeprazole  40mg  with rare breakthrough, some epigastric discomfort, nausea 2-3 days per week, taking phenergan  PRN. Appetite okay.    Recommended to continue omeprazole  40mg  daily, avoid NSAIDs, GES, phenergan  PRN, consider CT A/P if GES negative, TIF brochure provided  GES done 05/14/24 was normal  Last labs in September with hgb 14.8 wbc 16.2  CMP in August grossly unremarkable with normal electrolytes, renal function and LFTs  CT chest lung cancer screen 07/03/24  Two new pulmonary nodules in the left upper lobe, measuring up to 5.4 mm. Lung-RADS 3, probably benign findings. Short-term follow-up in 6 months is recommended  2. Right adrenal adenomas. 3. Left adrenalectomy and splenectomy. 4.  Emphysema   Present:  States she has nausea mostly everyday. She notes that often if she gets hot she will start to feel nauseated. She has vomiting at times as well. She is having headaches almost daily, she is taking naproxen  as needed for severe headaches. She has never seen a neurologist for her headaches. She has been on some other therapies for headaches without improvement. Patient reports she sometimes gets an initial pain in her head which will a lot of times bring on the nausea and then she will have more severe headache after this. Has occasional dizziness. Epigastric  pain at times, she is taking ativan  and phenergan  alternating, for her nausea. Appetite is stable, appetite is decent.    CT A/P with contrast: 07/2023 No CT evidence for acute intra-abdominal or pelvic abnormality. 2. Interim development of left lower quadrant abdominal wall laxity and appearance of wide neck hernia containing mesentery and bowel but no obstruction. 3. Status post splenectomy, left adrenalectomy, cholecystectomy, appendectomy, and hysterectomy. 4. Two right adrenal gland nodules. These likely represent adenomas. No specific imaging follow-up is recommended Last Colonoscopy:04/01/21- One 9 mm polyp in the cecum, (sessile serrated adenoma)Clips were placed- Two 3 to 5 mm polyps in the ascending colon(tubular adenoma) removed with a cold snare.- The distal rectum and anal verge are normal on retroflexion view.   Last Endoscopy:02/2024 - Z-line irregular. Biopsied.                           - Gastritis. Biopsied.                           - Duodenal mucosal lymphangiectasia.   Filed Weights   08/09/24 1347  Weight: 187 lb 1.6 oz (84.9 kg)     Past Medical History:  Diagnosis Date   Acute blood loss anemia 02/16/2012   Allergy    Arthritis    Phreesia 10/14/2020   Blood transfusion without reported diagnosis    Phreesia 10/14/2020   Breast cancer (HCC) 2007   History of  XRT, onTamoxifen   Bruises  easily    Cancer (HCC)    Phreesia 10/14/2020   Depression    Depression    Phreesia 10/14/2020   Diabetes mellitus    prediabetic   Diabetes mellitus without complication (HCC)    Phreesia 10/14/2020   GERD (gastroesophageal reflux disease)    Hypertriglyceridemia    Hypothyroidism    following chemo amnd radiaition for breast cancer, needed replacement short term   Hypothyroidism (acquired)    replaced x 1 year   IBS (irritable bowel syndrome)    Menieres disease    Controlled with triamterene     Migraines    Obesity    Palpitations    Personal history of  radiation therapy    Pituitary insufficiency    Sleep apnea 2010   Problems with CPAP    Past Surgical History:  Procedure Laterality Date   ABDOMINAL EXPOSURE N/A 12/03/2020   Procedure: ABDOMINAL EXPOSURE;  Surgeon: Oris Krystal FALCON, MD;  Location: [facility];  Service: Vascular;  Laterality: N/A;   ABDOMINAL HYSTERECTOMY  1998   Benign, fibroids   ADRENALECTOMY  02/16/2012   [facility], splemic trauma, 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   ANTERIOR LUMBAR FUSION N/A 12/03/2020   Procedure: Lumbar Four-Five Lumbar Five Sacral One Anterior lumbar interbody fusion;  Surgeon: Gillie Duncans, MD;  Location: [facility] OR;  Service: Neurosurgery;  Laterality: N/A;   APPENDECTOMY  06/12/2008   BIOPSY  07/02/2020   Procedure: BIOPSY;  Surgeon: Eartha Angelia Sieving, MD;  Location: [facility];  Service: Gastroenterology;;   BIOPSY  01/22/2021   Procedure: BIOPSY;  Surgeon: Shaaron Lamar HERO, MD;  [facility];  Service: Endoscopy;;   BIOPSY  02/17/2022   Procedure: BIOPSY;  Surgeon: Golda Claudis PENNER, MD;  [facility];  Service: Endoscopy;;   BREAST BIOPSY Left 02/21/2024   US  LT BREAST BX W LOC DEV 1ST LESION IMG BX SPEC US  GUIDE 02/21/2024 [facility]-UL[facility]   BREAST LUMPECTOMY Left 2008   BREAST RECONSTRUCTION     Left reconstructive   BREAST SURGERY     Mammosite - right side   CHOLECYSTECTOMY  1985   COLONOSCOPY WITH PROPOFOL  N/A 07/02/2020   Procedure: COLONOSCOPY WITH PROPOFOL ;  Surgeon: Eartha Angelia Sieving, MD;  [facility];  Service: Gastroenterology;  Laterality: N/A;  1045   COLONOSCOPY WITH PROPOFOL  N/A 04/01/2021   Procedure: COLONOSCOPY WITH PROPOFOL ;  Surgeon: Eartha Angelia Sieving, MD;  [facility];  Service: Gastroenterology;  Laterality: N/A;  12:50   ESOPHAGEAL DILATION  12/19/2015   Procedure: ESOPHAGEAL DILATION;  Surgeon: Claudis PENNER Golda, MD;  [facility];  Service: Endoscopy;;   ESOPHAGOGASTRODUODENOSCOPY N/A 12/19/2015    Procedure: ESOPHAGOGASTRODUODENOSCOPY (EGD);  Surgeon: Claudis PENNER Golda, MD;  [facility];  Service: Endoscopy;  Laterality: N/A;  11:40   ESOPHAGOGASTRODUODENOSCOPY N/A 02/28/2024   Procedure: EGD (ESOPHAGOGASTRODUODENOSCOPY);  Surgeon: Cinderella Deatrice FALCON, MD;  [facility];  Service: Endoscopy;  Laterality: N/A;  11:30AM;ASA 2   ESOPHAGOGASTRODUODENOSCOPY (EGD) WITH PROPOFOL  N/A 02/17/2022   Procedure: ESOPHAGOGASTRODUODENOSCOPY (EGD) WITH PROPOFOL ;  Surgeon: Golda Claudis PENNER, MD;  [facility];  Service: Endoscopy;  Laterality: N/A;  1045   FLEXIBLE SIGMOIDOSCOPY N/A 01/22/2021   Procedure: FLEXIBLE SIGMOIDOSCOPY;  Surgeon: Shaaron Lamar HERO, MD;  [facility];  Service: Endoscopy;  Laterality: N/A;   HEMOSTASIS CLIP PLACEMENT  04/01/2021   Procedure: HEMOSTASIS CLIP PLACEMENT;  Surgeon: Eartha Angelia Sieving, MD;  [facility];  Service: Gastroenterology;;  x2   POLYPECTOMY  07/02/2020   Procedure: POLYPECTOMY;  Surgeon: Eartha Flavors, Toribio, MD;  [facility];  Service: Gastroenterology;;   POLYPECTOMY  04/01/2021   Procedure: POLYPECTOMY INTESTINAL;  Surgeon: Eartha Flavors, Toribio, MD;  [facility];  Service: Gastroenterology;;   SPINE SURGERY     SPLENECTOMY, 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   THYROIDECTOMY, PARTIAL  11/05/2010   Benign disease   TUBAL LIGATION      Current Outpatient Medications  Medication Sig Dispense Refill   albuterol  (VENTOLIN  HFA) 108 (90 Base) MCG/ACT inhaler TAKE 2 PUFFS BY MOUTH EVERY 6 HOURS AS NEEDED FOR WHEEZE OR SHORTNESS OF BREATH     butalbital -acetaminophen -caffeine  (FIORICET) 50-325-40 MG tablet Take one tablet by mouth every 8  to 12 hours , as needed, for uncontrolled headache 20 tablet 3   Continuous Glucose Receiver (FREESTYLE LIBRE 3 READER) DEVI USE TO CHECK GLUCOSE CONTINUOUSLY. 1 each 1   Continuous Glucose Sensor  (FREESTYLE LIBRE 3 PLUS SENSOR) MISC Inject 1 Device into the skin continuous. Change every 15 days 6 each 3   fluticasone  (FLONASE ) 50 MCG/ACT nasal spray SPRAY 2 SPRAYS INTO EACH NOSTRIL EVERY DAY 48 mL 2   glipiZIDE  (GLUCOTROL  XL) 2.5 MG 24 hr tablet Take 1 tablet (2.5 mg total) by mouth daily with breakfast. 90 tablet 3   ibuprofen  (ADVIL ) 600 MG tablet Take 1 tablet (600 mg total) by mouth every 6 (six) hours as needed. 30 tablet 0   insulin  degludec (TRESIBA  FLEXTOUCH) 200 UNIT/ML FlexTouch Pen Inject 18 Units into the skin daily. 6 mL 1   Insulin  Pen Needle (BD PEN NEEDLE NANO 2ND GEN) 32G X 4 MM MISC USE 4X DAILY AS DIRECTED 400 each 1   Lancets (ONETOUCH DELICA PLUS LANCET33G) MISC USE TO TEST 4 TIMES A DAY 100 each 7   lidocaine  (LIDODERM ) 5 % Place 1 patch onto the skin daily as needed. Remove & Discard patch within 12 hours or as directed by MD 15 patch 0   LORazepam  (ATIVAN ) 1 MG tablet Take one tablet by mouth once daily, as needed, for uncontrolled nausea 30 tablet 1   omeprazole  (PRILOSEC) 40 MG capsule TAKE 1 CAPSULE DAILY 90 capsule 1   ONETOUCH VERIO test strip USE AS DIRECTED 2 TIMES DAILY 200 strip 3   oxyCODONE  (ROXICODONE ) 5 MG immediate release tablet Take 1 tablet (5 mg total) by mouth every 4 (four) hours as needed for severe pain (pain score 7-10). 10 tablet 0   promethazine  (PHENERGAN ) 12.5 MG tablet Take 1 tablet (12.5 mg total) by mouth every 8 (eight) hours as needed for nausea or vomiting. 30 tablet 1   rosuvastatin  (CRESTOR ) 20 MG tablet TAKE 1 TABLET DAILY 90 tablet 1   temazepam  (RESTORIL ) 15 MG capsule Take 1 capsule (15 mg total) by mouth at bedtime. 90 capsule 1   traZODone  (DESYREL ) 100 MG tablet TAKE 1 TABLET BY MOUTH EVERYDAY AT BEDTIME 90 tablet 1   triamterene -hydrochlorothiazide  (DYAZIDE ) 37.5-25 MG capsule TAKE 1 CAPSULE EVERY       MORNING 90 capsule 1   UNABLE TO FIND CPAP tubing and supplies, face mask Dx: G47.30 1 each 0   venlafaxine  XR (EFFEXOR -XR)  75 MG 24 hr capsule TAKE 1 CAPSULE DAILY 90 capsule 1   No current facility-administered medications for this visit.    Allergies as of 08/09/2024 - Review Complete 08/09/2024  Allergen Reaction Noted   Penicillins Swelling  Metformin  and related Diarrhea and Nausea Only 03/30/2015   Wound dressing adhesive Itching and Rash 09/09/2020   Jardiance [empagliflozin] Other (See Comments) 02/29/2020   Statins Other (See Comments) 07/15/2014   Zetia  [ezetimibe ] Other (See Comments) 11/21/2014    Social History   Socioeconomic History   Marital status: Married    Spouse name: Merilee   Number of children: 2   Years of education: Not on file   Highest education level: GED or equivalent  Occupational History   Not on file  Tobacco Use   Smoking status: Every Day    Current packs/day: 0.50    Average packs/day: 0.5 packs/day for 40.0 years (20.0 ttl pk-yrs)    Types: Cigarettes   Smokeless tobacco: Never   Tobacco comments:    1/2 pack a day    02/15/2023 Patient smokes about 1/2 pack daily  Vaping Use   Vaping status: Never Used  Substance and Sexual Activity   Alcohol use: No    Comment: Encouraged to quit smoking. She has tried the nicotone gum and patches but did not help   Drug use: Yes    Types: Marijuana   Sexual activity: Not Currently    Birth control/protection: Surgical  Other Topics Concern   Not on file  Social History Narrative   Not on file   Social Drivers of Health   Financial Resource Strain: Medium Risk (05/02/2024)   Overall Financial Resource Strain (CARDIA)    Difficulty of Paying Living Expenses: Somewhat hard  Food Insecurity: Food Insecurity Present (05/02/2024)   Hunger Vital Sign    Worried About Running Out of Food in the Last Year: Sometimes true    Ran Out of Food in the Last Year: Sometimes true  Transportation Needs: No Transportation Needs (05/02/2024)   PRAPARE - Administrator, Civil Service (Medical): No    Lack of  Transportation (Non-Medical): No  Physical Activity: Insufficiently Active (05/02/2024)   Exercise Vital Sign    Days of Exercise per Week: 3 days    Minutes of Exercise per Session: 30 min  Stress: Stress Concern Present (05/02/2024)   [employer] of Occupational Health - Occupational Stress Questionnaire    Feeling of Stress: To some extent  Social Connections: Socially Integrated (05/02/2024)   Social Connection and Isolation Panel    Frequency of Communication with Friends and Family: More than three times a week    Frequency of Social Gatherings with Friends and Family: More than three times a week    Attends Religious Services: 1 to 4 times per year    Active Member of [employer] or Organizations: Yes    Attends Banker Meetings: 1 to 4 times per year    Marital Status: Married    Review of systems General: negative for malaise, night sweats, fever, chills, weight loss Neck: Negative for lumps, goiter, pain and significant neck swelling Resp: Negative for cough, wheezing, dyspnea at rest CV: Negative for chest pain, leg swelling, palpitations, orthopnea GI: denies melena, hematochezia, diarrhea, constipation, dysphagia, odyonophagia, early satiety or unintentional weight loss. +nausea +vomiting  MSK: Negative for joint pain or swelling, back pain, and muscle pain. Derm: Negative for itching or rash Psych: Denies depression, anxiety, memory loss, confusion. No homicidal or suicidal ideation.  Heme: Negative for prolonged bleeding, bruising easily, and swollen nodes. Endocrine: Negative for cold or heat intolerance, polyuria, polydipsia and goiter. Neuro: negative for tremor, gait imbalance, syncope and seizures. The remainder of the review of  systems is noncontributory.  Physical Exam: BP 112/60 (BP Location: Right Arm, Patient Position: Sitting, Cuff Size: Normal)   Pulse (!) 120   Temp 97.9 F (36.6 C) (Temporal)   Ht 5' 6 (1.676 m)   Wt 187 lb 1.6 oz (84.9  kg)   BMI 30.20 kg/m  General:   Alert and oriented. No distress noted. Pleasant and cooperative.  Head:  Normocephalic and atraumatic. Eyes:  Conjuctiva clear without scleral icterus. Mouth:  Oral mucosa pink and moist. Good dentition. No lesions. Heart: Normal rate and rhythm, s1 and s2 heart sounds present.  Lungs: Clear lung sounds in all lobes. Respirations equal and unlabored. Abdomen:  +BS, soft, non-tender and non-distended. No rebound or guarding. No HSM or masses noted. Derm: No palmar erythema or jaundice Msk:  Symmetrical without gross deformities. Normal posture. Extremities:  Without edema. Neurologic:  Alert and  oriented x4 Psych:  Alert and cooperative. Normal mood and affect.  Invalid input(s): 6 MONTHS   ASSESSMENT: Annette [name] is a 63 y.o. female presenting today for ongoing nausea and vomiting  Patient with history signficant for adrenalectomy in the past, with ongoing nausea, vomiting. Recent EGD and GES both without findings to explain her GI symptoms. Cross sectional imaging in October 2024 without acute findings. She also endorses frequent headaches and has had these for years notably her AM cortisol in 2023 was low but appears she had a dose of steroids prior to this for dexamethasone  suppression testing. At this time, etiology of her nausea and vomiting is unclear. Would recommend checking AM cortisol levels, if these are normal, would have low threshold to do imaging of her brain to ensure no findings her contributing to her symptoms.    PLAN:  -check fasting AM cortisol levels -continue phenergan  PRN -consider CT of the head if cortisol is negative  All questions were answered, patient verbalized understanding and is in agreement with plan as outlined above.   Follow Up: 3 months   Kolbe Delmonaco L. Mariette, MSN, APRN, AGNP-C Adult-Gerontology Nurse Practitioner [facility]  I have reviewed the note and agree with the APP's  assessment as described in this progress note  Toribio Fortune, MD Gastroenterology and Hepatology [facility]

## 2024-08-09 NOTE — Patient Instructions (Signed)
 I am ordering an AM cortisol level, please fast other than water  before this and try to complete first thing in the morning between 8-9am If this is normal, we will consider a CT of your head  Follow up 3 months  It was a pleasure to see you today. I want to create trusting relationships with patients and provide genuine, compassionate, and quality care. I truly value your feedback! please be on the lookout for a survey regarding your visit with me today. I appreciate your input about our visit and your time in completing this!    Haylie Mccutcheon L. Ednah Hammock, MSN, APRN, AGNP-C Adult-Gerontology Nurse Practitioner [facility]enter[facility] at [facility]

## 2024-09-16 ENCOUNTER — Other Ambulatory Visit: Payer: Self-pay | Admitting: Family Medicine

## 2024-09-16 DIAGNOSIS — F5104 Psychophysiologic insomnia: Secondary | ICD-10-CM

## 2024-09-24 ENCOUNTER — Other Ambulatory Visit: Payer: Self-pay | Admitting: Family Medicine

## 2024-09-24 ENCOUNTER — Other Ambulatory Visit: Payer: Self-pay | Admitting: Internal Medicine

## 2024-09-24 DIAGNOSIS — R112 Nausea with vomiting, unspecified: Secondary | ICD-10-CM

## 2024-09-24 DIAGNOSIS — F5104 Psychophysiologic insomnia: Secondary | ICD-10-CM

## 2024-09-24 DIAGNOSIS — R0981 Nasal congestion: Secondary | ICD-10-CM

## 2024-10-02 ENCOUNTER — Other Ambulatory Visit (HOSPITAL_COMMUNITY): Payer: Self-pay

## 2024-10-02 ENCOUNTER — Telehealth: Payer: Self-pay | Admitting: Pharmacy Technician

## 2024-10-02 NOTE — Telephone Encounter (Signed)
 Pharmacy Patient Advocate Encounter   Received notification from Onbase that prior authorization for FreeStyle [place] 3 Reader device  is required/requested.   Insurance verification completed.   The patient is insured through [facility]. Archived key: [id number]   Per test claim: Refill too soon. Max 1 reader per 274 days. PA is not needed at this time. Next eligible fill date is 04/19/25.

## 2024-10-05 ENCOUNTER — Ambulatory Visit: Admitting: Family Medicine

## 2024-10-11 ENCOUNTER — Other Ambulatory Visit: Payer: Self-pay | Admitting: Family Medicine

## 2024-10-11 DIAGNOSIS — E7800 Pure hypercholesterolemia, unspecified: Secondary | ICD-10-CM

## 2024-10-18 ENCOUNTER — Other Ambulatory Visit: Payer: Self-pay

## 2024-10-18 DIAGNOSIS — Z87891 Personal history of nicotine dependence: Secondary | ICD-10-CM

## 2024-10-18 DIAGNOSIS — Z122 Encounter for screening for malignant neoplasm of respiratory organs: Secondary | ICD-10-CM

## 2024-10-18 DIAGNOSIS — F17210 Nicotine dependence, cigarettes, uncomplicated: Secondary | ICD-10-CM

## 2024-10-23 ENCOUNTER — Other Ambulatory Visit: Payer: Self-pay | Admitting: Family Medicine

## 2024-10-23 ENCOUNTER — Other Ambulatory Visit: Payer: Self-pay | Admitting: Internal Medicine

## 2024-10-23 DIAGNOSIS — E785 Hyperlipidemia, unspecified: Secondary | ICD-10-CM

## 2024-10-23 DIAGNOSIS — Z794 Long term (current) use of insulin: Secondary | ICD-10-CM

## 2024-10-31 ENCOUNTER — Encounter: Payer: Self-pay | Admitting: Family Medicine

## 2024-10-31 MED ORDER — DOXYCYCLINE HYCLATE 100 MG PO TABS: 100.0000 mg | ORAL_TABLET | Freq: Two times a day (BID) | ORAL | 0 refills | Status: AC

## 2024-10-31 MED ORDER — FLUCONAZOLE 150 MG PO TABS: 150.0000 mg | ORAL_TABLET | Freq: Once | ORAL | 0 refills | Status: AC

## 2024-11-19 ENCOUNTER — Ambulatory Visit (INDEPENDENT_AMBULATORY_CARE_PROVIDER_SITE_OTHER): Admitting: Gastroenterology

## 2024-11-20 ENCOUNTER — Ambulatory Visit: Admitting: Internal Medicine

## 2025-01-25 ENCOUNTER — Ambulatory Visit (HOSPITAL_COMMUNITY)

## 2025-05-23 ENCOUNTER — Other Ambulatory Visit

## 2025-05-30 ENCOUNTER — Other Ambulatory Visit

## 2025-05-30 ENCOUNTER — Ambulatory Visit: Payer: Self-pay | Admitting: Oncology

## 2025-05-30 ENCOUNTER — Ambulatory Visit: Admitting: Oncology
